# Patient Record
Sex: Male | Born: 1945
Health system: Southern US, Community
[De-identification: ages and names within clinical notes are randomized; demographics above are authoritative.]

## PROBLEM LIST (undated history)

## (undated) DIAGNOSIS — E119 Type 2 diabetes mellitus without complications: Secondary | ICD-10-CM

## (undated) DIAGNOSIS — I509 Heart failure, unspecified: Secondary | ICD-10-CM

## (undated) DIAGNOSIS — R0600 Dyspnea, unspecified: Secondary | ICD-10-CM

## (undated) DIAGNOSIS — J9611 Chronic respiratory failure with hypoxia: Secondary | ICD-10-CM

## (undated) DIAGNOSIS — J42 Unspecified chronic bronchitis: Secondary | ICD-10-CM

## (undated) DIAGNOSIS — J449 Chronic obstructive pulmonary disease, unspecified: Secondary | ICD-10-CM

## (undated) DIAGNOSIS — I10 Essential (primary) hypertension: Secondary | ICD-10-CM

## (undated) DIAGNOSIS — I5189 Other ill-defined heart diseases: Secondary | ICD-10-CM

## (undated) HISTORY — PX: UPPER GI ENDOSCOPY: SHX6162

## (undated) HISTORY — DX: Heart failure, unspecified: I50.9

## (undated) HISTORY — PX: CIRCUMCISION: SUR203

---

## 2006-07-26 ENCOUNTER — Ambulatory Visit: Payer: Self-pay | Admitting: Unknown Physician Specialty

## 2007-03-18 ENCOUNTER — Ambulatory Visit: Payer: Self-pay | Admitting: Internal Medicine

## 2008-04-27 ENCOUNTER — Ambulatory Visit: Payer: Self-pay | Admitting: Internal Medicine

## 2008-05-18 ENCOUNTER — Ambulatory Visit: Payer: Self-pay | Admitting: Internal Medicine

## 2008-05-30 ENCOUNTER — Emergency Department: Payer: Self-pay | Admitting: Emergency Medicine

## 2008-08-26 ENCOUNTER — Ambulatory Visit: Payer: Self-pay | Admitting: Urology

## 2009-07-27 ENCOUNTER — Ambulatory Visit: Payer: Self-pay | Admitting: Internal Medicine

## 2012-02-11 ENCOUNTER — Ambulatory Visit: Payer: Self-pay

## 2012-02-11 LAB — AMYLASE: Amylase: 70 U/L (ref 25–115)

## 2012-02-11 LAB — COMPREHENSIVE METABOLIC PANEL
Alkaline Phosphatase: 109 U/L (ref 50–136)
Bilirubin,Total: 0.7 mg/dL (ref 0.2–1.0)
Chloride: 95 mmol/L — ABNORMAL LOW (ref 98–107)
Co2: 29 mmol/L (ref 21–32)
EGFR (African American): >60
EGFR (Non-African Amer.): >60
Glucose: 179 mg/dL — ABNORMAL HIGH (ref 65–99)
Potassium: 4.1 mmol/L (ref 3.5–5.1)
SGOT(AST): 20 U/L (ref 15–37)
SGPT (ALT): 45 U/L
Total Protein: 7.9 g/dL (ref 6.4–8.2)

## 2012-02-11 LAB — CBC WITH DIFFERENTIAL/PLATELET
Basophil #: 0.1 10*3/uL (ref 0.0–0.1)
Basophil %: 0.3 %
Eosinophil #: 0.3 10*3/uL (ref 0.0–0.7)
Eosinophil %: 1.5 %
HCT: 51.6 % (ref 40.0–52.0)
Lymphocyte #: 4 10*3/uL — ABNORMAL HIGH (ref 1.0–3.6)
Monocyte #: 1.6 x10 3/mm — ABNORMAL HIGH (ref 0.2–1.0)
Neutrophil #: 13.4 10*3/uL — ABNORMAL HIGH (ref 1.4–6.5)
Neutrophil %: 69.1 %
RBC: 5.45 10*6/uL (ref 4.40–5.90)
RDW: 13.7 % (ref 11.5–14.5)

## 2012-02-11 LAB — URINALYSIS, COMPLETE
Bacteria: NEGATIVE
Bilirubin,UR: NEGATIVE
Nitrite: NEGATIVE
Specific Gravity: 1.01 (ref 1.003–1.030)
Squamous Epithelial: NONE SEEN

## 2012-02-13 LAB — URINE CULTURE

## 2014-01-27 ENCOUNTER — Ambulatory Visit: Payer: Self-pay | Admitting: Specialist

## 2015-03-23 DIAGNOSIS — Z87891 Personal history of nicotine dependence: Secondary | ICD-10-CM | POA: Diagnosis not present

## 2015-03-23 DIAGNOSIS — M67919 Unspecified disorder of synovium and tendon, unspecified shoulder: Secondary | ICD-10-CM | POA: Diagnosis not present

## 2015-03-23 DIAGNOSIS — E784 Other hyperlipidemia: Secondary | ICD-10-CM | POA: Diagnosis not present

## 2015-03-23 DIAGNOSIS — R531 Weakness: Secondary | ICD-10-CM | POA: Diagnosis not present

## 2015-03-23 DIAGNOSIS — I10 Essential (primary) hypertension: Secondary | ICD-10-CM | POA: Diagnosis not present

## 2015-04-13 DIAGNOSIS — I272 Other secondary pulmonary hypertension: Secondary | ICD-10-CM | POA: Diagnosis not present

## 2015-04-13 DIAGNOSIS — E1165 Type 2 diabetes mellitus with hyperglycemia: Secondary | ICD-10-CM | POA: Diagnosis not present

## 2015-04-13 DIAGNOSIS — R7301 Impaired fasting glucose: Secondary | ICD-10-CM | POA: Diagnosis not present

## 2015-04-13 DIAGNOSIS — I119 Hypertensive heart disease without heart failure: Secondary | ICD-10-CM | POA: Diagnosis not present

## 2015-04-20 DIAGNOSIS — M7550 Bursitis of unspecified shoulder: Secondary | ICD-10-CM | POA: Diagnosis not present

## 2015-04-20 DIAGNOSIS — Z87891 Personal history of nicotine dependence: Secondary | ICD-10-CM | POA: Diagnosis not present

## 2015-04-20 DIAGNOSIS — M67919 Unspecified disorder of synovium and tendon, unspecified shoulder: Secondary | ICD-10-CM | POA: Diagnosis not present

## 2015-04-20 DIAGNOSIS — I272 Other secondary pulmonary hypertension: Secondary | ICD-10-CM | POA: Diagnosis not present

## 2015-05-04 DIAGNOSIS — E119 Type 2 diabetes mellitus without complications: Secondary | ICD-10-CM | POA: Diagnosis not present

## 2015-05-04 DIAGNOSIS — M7550 Bursitis of unspecified shoulder: Secondary | ICD-10-CM | POA: Diagnosis not present

## 2015-05-04 DIAGNOSIS — I272 Other secondary pulmonary hypertension: Secondary | ICD-10-CM | POA: Diagnosis not present

## 2015-05-04 DIAGNOSIS — J439 Emphysema, unspecified: Secondary | ICD-10-CM | POA: Diagnosis not present

## 2015-05-05 DIAGNOSIS — Z23 Encounter for immunization: Secondary | ICD-10-CM | POA: Diagnosis not present

## 2015-06-01 DIAGNOSIS — J431 Panlobular emphysema: Secondary | ICD-10-CM | POA: Diagnosis not present

## 2015-06-01 DIAGNOSIS — E119 Type 2 diabetes mellitus without complications: Secondary | ICD-10-CM | POA: Diagnosis not present

## 2015-06-01 DIAGNOSIS — I272 Other secondary pulmonary hypertension: Secondary | ICD-10-CM | POA: Diagnosis not present

## 2015-06-03 DIAGNOSIS — E119 Type 2 diabetes mellitus without complications: Secondary | ICD-10-CM | POA: Diagnosis not present

## 2015-06-03 DIAGNOSIS — Z125 Encounter for screening for malignant neoplasm of prostate: Secondary | ICD-10-CM | POA: Diagnosis not present

## 2015-06-03 DIAGNOSIS — E784 Other hyperlipidemia: Secondary | ICD-10-CM | POA: Diagnosis not present

## 2015-06-03 DIAGNOSIS — I10 Essential (primary) hypertension: Secondary | ICD-10-CM | POA: Diagnosis not present

## 2015-06-08 DIAGNOSIS — M619 Calcification and ossification of muscle, unspecified: Secondary | ICD-10-CM | POA: Diagnosis not present

## 2015-06-08 DIAGNOSIS — M7550 Bursitis of unspecified shoulder: Secondary | ICD-10-CM | POA: Diagnosis not present

## 2015-06-08 DIAGNOSIS — J439 Emphysema, unspecified: Secondary | ICD-10-CM | POA: Diagnosis not present

## 2015-06-08 DIAGNOSIS — I272 Other secondary pulmonary hypertension: Secondary | ICD-10-CM | POA: Diagnosis not present

## 2015-06-29 DIAGNOSIS — Z87891 Personal history of nicotine dependence: Secondary | ICD-10-CM | POA: Diagnosis not present

## 2015-06-29 DIAGNOSIS — I272 Other secondary pulmonary hypertension: Secondary | ICD-10-CM | POA: Diagnosis not present

## 2015-06-29 DIAGNOSIS — M67919 Unspecified disorder of synovium and tendon, unspecified shoulder: Secondary | ICD-10-CM | POA: Diagnosis not present

## 2015-06-29 DIAGNOSIS — J439 Emphysema, unspecified: Secondary | ICD-10-CM | POA: Diagnosis not present

## 2015-07-13 DIAGNOSIS — J439 Emphysema, unspecified: Secondary | ICD-10-CM | POA: Diagnosis not present

## 2015-07-13 DIAGNOSIS — I272 Other secondary pulmonary hypertension: Secondary | ICD-10-CM | POA: Diagnosis not present

## 2015-07-13 DIAGNOSIS — M7550 Bursitis of unspecified shoulder: Secondary | ICD-10-CM | POA: Diagnosis not present

## 2015-07-13 DIAGNOSIS — M67919 Unspecified disorder of synovium and tendon, unspecified shoulder: Secondary | ICD-10-CM | POA: Diagnosis not present

## 2015-08-25 DIAGNOSIS — J209 Acute bronchitis, unspecified: Secondary | ICD-10-CM | POA: Diagnosis not present

## 2015-08-25 DIAGNOSIS — M7550 Bursitis of unspecified shoulder: Secondary | ICD-10-CM | POA: Diagnosis not present

## 2015-08-25 DIAGNOSIS — M67919 Unspecified disorder of synovium and tendon, unspecified shoulder: Secondary | ICD-10-CM | POA: Diagnosis not present

## 2015-08-25 DIAGNOSIS — J439 Emphysema, unspecified: Secondary | ICD-10-CM | POA: Diagnosis not present

## 2015-10-10 DIAGNOSIS — M7550 Bursitis of unspecified shoulder: Secondary | ICD-10-CM | POA: Diagnosis not present

## 2015-10-10 DIAGNOSIS — J209 Acute bronchitis, unspecified: Secondary | ICD-10-CM | POA: Diagnosis not present

## 2015-10-10 DIAGNOSIS — M67919 Unspecified disorder of synovium and tendon, unspecified shoulder: Secondary | ICD-10-CM | POA: Diagnosis not present

## 2015-10-10 DIAGNOSIS — I272 Other secondary pulmonary hypertension: Secondary | ICD-10-CM | POA: Diagnosis not present

## 2015-11-02 DIAGNOSIS — I739 Peripheral vascular disease, unspecified: Secondary | ICD-10-CM | POA: Diagnosis not present

## 2015-11-02 DIAGNOSIS — J209 Acute bronchitis, unspecified: Secondary | ICD-10-CM | POA: Diagnosis not present

## 2015-11-22 DIAGNOSIS — J439 Emphysema, unspecified: Secondary | ICD-10-CM | POA: Diagnosis not present

## 2015-11-22 DIAGNOSIS — I739 Peripheral vascular disease, unspecified: Secondary | ICD-10-CM | POA: Diagnosis not present

## 2015-11-22 DIAGNOSIS — G4700 Insomnia, unspecified: Secondary | ICD-10-CM | POA: Diagnosis not present

## 2015-11-22 DIAGNOSIS — M67919 Unspecified disorder of synovium and tendon, unspecified shoulder: Secondary | ICD-10-CM | POA: Diagnosis not present

## 2015-11-22 DIAGNOSIS — I272 Other secondary pulmonary hypertension: Secondary | ICD-10-CM | POA: Diagnosis not present

## 2016-01-03 DIAGNOSIS — I272 Other secondary pulmonary hypertension: Secondary | ICD-10-CM | POA: Diagnosis not present

## 2016-01-03 DIAGNOSIS — M67919 Unspecified disorder of synovium and tendon, unspecified shoulder: Secondary | ICD-10-CM | POA: Diagnosis not present

## 2016-01-03 DIAGNOSIS — J439 Emphysema, unspecified: Secondary | ICD-10-CM | POA: Diagnosis not present

## 2016-01-03 DIAGNOSIS — I739 Peripheral vascular disease, unspecified: Secondary | ICD-10-CM | POA: Diagnosis not present

## 2016-01-24 DIAGNOSIS — M79609 Pain in unspecified limb: Secondary | ICD-10-CM | POA: Diagnosis not present

## 2016-01-24 DIAGNOSIS — I10 Essential (primary) hypertension: Secondary | ICD-10-CM | POA: Diagnosis not present

## 2016-01-24 DIAGNOSIS — E785 Hyperlipidemia, unspecified: Secondary | ICD-10-CM | POA: Diagnosis not present

## 2016-01-24 DIAGNOSIS — E119 Type 2 diabetes mellitus without complications: Secondary | ICD-10-CM | POA: Diagnosis not present

## 2016-02-15 DIAGNOSIS — M79609 Pain in unspecified limb: Secondary | ICD-10-CM | POA: Diagnosis not present

## 2016-02-15 DIAGNOSIS — I10 Essential (primary) hypertension: Secondary | ICD-10-CM | POA: Diagnosis not present

## 2016-02-15 DIAGNOSIS — E119 Type 2 diabetes mellitus without complications: Secondary | ICD-10-CM | POA: Diagnosis not present

## 2016-02-15 DIAGNOSIS — E785 Hyperlipidemia, unspecified: Secondary | ICD-10-CM | POA: Diagnosis not present

## 2016-02-15 DIAGNOSIS — I739 Peripheral vascular disease, unspecified: Secondary | ICD-10-CM | POA: Diagnosis not present

## 2016-03-12 DIAGNOSIS — M7542 Impingement syndrome of left shoulder: Secondary | ICD-10-CM | POA: Diagnosis not present

## 2016-03-12 DIAGNOSIS — S46012A Strain of muscle(s) and tendon(s) of the rotator cuff of left shoulder, initial encounter: Secondary | ICD-10-CM | POA: Diagnosis not present

## 2016-04-02 DIAGNOSIS — M7542 Impingement syndrome of left shoulder: Secondary | ICD-10-CM | POA: Diagnosis not present

## 2016-04-24 DIAGNOSIS — I272 Other secondary pulmonary hypertension: Secondary | ICD-10-CM | POA: Diagnosis not present

## 2016-04-24 DIAGNOSIS — J439 Emphysema, unspecified: Secondary | ICD-10-CM | POA: Diagnosis not present

## 2016-04-24 DIAGNOSIS — J209 Acute bronchitis, unspecified: Secondary | ICD-10-CM | POA: Diagnosis not present

## 2016-04-24 DIAGNOSIS — I739 Peripheral vascular disease, unspecified: Secondary | ICD-10-CM | POA: Diagnosis not present

## 2016-05-07 DIAGNOSIS — S46012A Strain of muscle(s) and tendon(s) of the rotator cuff of left shoulder, initial encounter: Secondary | ICD-10-CM | POA: Diagnosis not present

## 2016-05-07 DIAGNOSIS — S46812A Strain of other muscles, fascia and tendons at shoulder and upper arm level, left arm, initial encounter: Secondary | ICD-10-CM | POA: Diagnosis not present

## 2016-05-07 DIAGNOSIS — M7542 Impingement syndrome of left shoulder: Secondary | ICD-10-CM | POA: Diagnosis not present

## 2016-05-07 DIAGNOSIS — M25412 Effusion, left shoulder: Secondary | ICD-10-CM | POA: Diagnosis not present

## 2016-05-07 DIAGNOSIS — M7582 Other shoulder lesions, left shoulder: Secondary | ICD-10-CM | POA: Diagnosis not present

## 2016-05-07 DIAGNOSIS — M7522 Bicipital tendinitis, left shoulder: Secondary | ICD-10-CM | POA: Diagnosis not present

## 2016-05-17 DIAGNOSIS — M7542 Impingement syndrome of left shoulder: Secondary | ICD-10-CM | POA: Diagnosis not present

## 2016-05-23 DIAGNOSIS — M75120 Complete rotator cuff tear or rupture of unspecified shoulder, not specified as traumatic: Secondary | ICD-10-CM | POA: Insufficient documentation

## 2016-05-23 DIAGNOSIS — M75122 Complete rotator cuff tear or rupture of left shoulder, not specified as traumatic: Secondary | ICD-10-CM | POA: Diagnosis not present

## 2016-06-07 ENCOUNTER — Other Ambulatory Visit: Payer: Self-pay | Admitting: Specialist

## 2016-06-12 ENCOUNTER — Encounter
Admission: RE | Admit: 2016-06-12 | Discharge: 2016-06-12 | Disposition: A | Discharge status: Home / Self Care | Payer: Medicare PPO | Source: Ambulatory Visit | Attending: Specialist | Admitting: Specialist

## 2016-06-12 DIAGNOSIS — R9431 Abnormal electrocardiogram [ECG] [EKG]: Secondary | ICD-10-CM | POA: Diagnosis not present

## 2016-06-12 DIAGNOSIS — E119 Type 2 diabetes mellitus without complications: Secondary | ICD-10-CM | POA: Diagnosis not present

## 2016-06-12 DIAGNOSIS — Z01812 Encounter for preprocedural laboratory examination: Secondary | ICD-10-CM | POA: Insufficient documentation

## 2016-06-12 DIAGNOSIS — I10 Essential (primary) hypertension: Secondary | ICD-10-CM | POA: Diagnosis not present

## 2016-06-12 DIAGNOSIS — Z01818 Encounter for other preprocedural examination: Secondary | ICD-10-CM | POA: Insufficient documentation

## 2016-06-12 HISTORY — DX: Dyspnea, unspecified: R06.00

## 2016-06-12 HISTORY — DX: Essential (primary) hypertension: I10

## 2016-06-12 HISTORY — DX: Type 2 diabetes mellitus without complications: E11.9

## 2016-06-12 HISTORY — DX: Unspecified chronic bronchitis: J42

## 2016-06-12 LAB — CBC
HEMATOCRIT: 42.4 % (ref 40.0–52.0)
Hemoglobin: 14.7 g/dL (ref 13.0–18.0)
MCH: 33.3 pg (ref 26.0–34.0)
MCHC: 34.6 g/dL (ref 32.0–36.0)
MCV: 96.3 fL (ref 80.0–100.0)
PLATELETS: 286 10*3/uL (ref 150–440)
RBC: 4.4 MIL/uL (ref 4.40–5.90)
RDW: 14.3 % (ref 11.5–14.5)
WBC: 12.2 10*3/uL — AB (ref 3.8–10.6)

## 2016-06-12 LAB — BASIC METABOLIC PANEL
ANION GAP: 10 (ref 5–15)
BUN: 19 mg/dL (ref 6–20)
CALCIUM: 9.6 mg/dL (ref 8.9–10.3)
CO2: 28 mmol/L (ref 22–32)
Chloride: 102 mmol/L (ref 101–111)
Creatinine, Ser: 0.71 mg/dL (ref 0.61–1.24)
Glucose, Bld: 113 mg/dL — ABNORMAL HIGH (ref 65–99)
POTASSIUM: 3.7 mmol/L (ref 3.5–5.1)
Sodium: 140 mmol/L (ref 135–145)

## 2016-06-12 NOTE — Patient Instructions (Signed)
Your procedure is scheduled on: 06/20/16 Wednesday Report to Day Surgery. 2nd floor medical mall entrance To find out your arrival time please call [phone] between 1PM - 3PM on 06/19/16 Tuesday.  Remember: Instructions that are not followed completely may result in serious medical risk, up to and including death, or upon the discretion of your surgeon and anesthesiologist your surgery may need to be rescheduled.    __X__ 1. Do not eat food or drink liquids after midnight. No gum chewing or hard candies.     __X__ 2. No Alcohol for 24 hours before or after surgery.   ____ 3. Bring all medications with you on the day of surgery if instructed.    __X__ 4. Notify your doctor if there is any change in your medical condition     (cold, fever, infections).     Do not wear jewelry, make-up, hairpins, clips or nail polish.  Do not wear lotions, powders, or perfumes.   Do not shave 48 hours prior to surgery. Men may shave face and neck.  Do not bring valuables to the hospital.    [facility] is not responsible for any belongings or valuables.               Contacts, dentures or bridgework may not be worn into surgery.  Leave your suitcase in the car. After surgery it may be brought to your room.  For patients admitted to the hospital, discharge time is determined by your                treatment team.   Patients discharged the day of surgery will not be allowed to drive home.   Please read over the following fact sheets that you were given:   Surgical Site Infection Prevention   ____ Take these medicines the morning of surgery with A SIP OF WATER:    1. none  2.   3.   4.  5.  6.  ____ Fleet Enema (as directed)   __x__ Use CHG Soap as directed  __x__ Use inhalers on the day of surgery USE SYMBICORT AND VENTOLIN AND BRING VENTOLIN TO HOSPITAL  __X__ Stop metformin 2 days prior to surgery    ____ Take 1/2 of usual insulin dose the night before surgery and none on the morning  of surgery.   __X__ Stop Coumadin/Plavix/aspirin on 5 DAYS PRIOR AS INSTRUCTED BY DR MILLER  ____ Stop Anti-inflammatories on    __X__ Stop supplements until after surgery. VITAMIN B SUPER COMPLEX, VITAMIN C, FISH OIL   ____ Bring C-Pap to the hospital.

## 2016-06-14 NOTE — Pre-Procedure Instructions (Signed)
Faxed request for clearance/ekg,as instructed by dr Kayleen Memos, to dr [facility] and dr Ammie Ferrier. Had to leave message both places

## 2016-06-15 DIAGNOSIS — J439 Emphysema, unspecified: Secondary | ICD-10-CM | POA: Diagnosis not present

## 2016-06-15 DIAGNOSIS — M7550 Bursitis of unspecified shoulder: Secondary | ICD-10-CM | POA: Diagnosis not present

## 2016-06-15 DIAGNOSIS — E119 Type 2 diabetes mellitus without complications: Secondary | ICD-10-CM | POA: Diagnosis not present

## 2016-06-15 DIAGNOSIS — R079 Chest pain, unspecified: Secondary | ICD-10-CM | POA: Diagnosis not present

## 2016-06-15 DIAGNOSIS — I739 Peripheral vascular disease, unspecified: Secondary | ICD-10-CM | POA: Diagnosis not present

## 2016-06-18 DIAGNOSIS — R079 Chest pain, unspecified: Secondary | ICD-10-CM | POA: Diagnosis not present

## 2016-06-18 DIAGNOSIS — J209 Acute bronchitis, unspecified: Secondary | ICD-10-CM | POA: Diagnosis not present

## 2016-06-18 DIAGNOSIS — I739 Peripheral vascular disease, unspecified: Secondary | ICD-10-CM | POA: Diagnosis not present

## 2016-06-18 DIAGNOSIS — M7550 Bursitis of unspecified shoulder: Secondary | ICD-10-CM | POA: Diagnosis not present

## 2016-06-18 DIAGNOSIS — J439 Emphysema, unspecified: Secondary | ICD-10-CM | POA: Diagnosis not present

## 2016-06-18 NOTE — Pre-Procedure Instructions (Signed)
STRESS TEST DONE AND CLEARED LOW RISK BY DR Lavera Guise

## 2016-06-20 ENCOUNTER — Ambulatory Visit
Admission: RE | Admit: 2016-06-20 | Discharge: 2016-06-20 | Disposition: A | Discharge status: Home / Self Care | Payer: Medicare PPO | Source: Ambulatory Visit | Attending: Specialist | Admitting: Specialist

## 2016-06-20 ENCOUNTER — Ambulatory Visit: Payer: Medicare PPO | Admitting: Anesthesiology

## 2016-06-20 ENCOUNTER — Encounter
Admission: RE | Disposition: A | Discharge status: Home / Self Care | Payer: Self-pay | Source: Ambulatory Visit | Attending: Specialist

## 2016-06-20 DIAGNOSIS — Z880 Allergy status to penicillin: Secondary | ICD-10-CM | POA: Insufficient documentation

## 2016-06-20 DIAGNOSIS — Z79899 Other long term (current) drug therapy: Secondary | ICD-10-CM | POA: Diagnosis not present

## 2016-06-20 DIAGNOSIS — Z8249 Family history of ischemic heart disease and other diseases of the circulatory system: Secondary | ICD-10-CM | POA: Insufficient documentation

## 2016-06-20 DIAGNOSIS — M719 Bursopathy, unspecified: Secondary | ICD-10-CM | POA: Insufficient documentation

## 2016-06-20 DIAGNOSIS — Z87891 Personal history of nicotine dependence: Secondary | ICD-10-CM | POA: Insufficient documentation

## 2016-06-20 DIAGNOSIS — I10 Essential (primary) hypertension: Secondary | ICD-10-CM | POA: Insufficient documentation

## 2016-06-20 DIAGNOSIS — M25512 Pain in left shoulder: Secondary | ICD-10-CM | POA: Diagnosis not present

## 2016-06-20 DIAGNOSIS — R0902 Hypoxemia: Secondary | ICD-10-CM | POA: Diagnosis not present

## 2016-06-20 DIAGNOSIS — J449 Chronic obstructive pulmonary disease, unspecified: Secondary | ICD-10-CM | POA: Insufficient documentation

## 2016-06-20 DIAGNOSIS — E119 Type 2 diabetes mellitus without complications: Secondary | ICD-10-CM | POA: Insufficient documentation

## 2016-06-20 DIAGNOSIS — M75122 Complete rotator cuff tear or rupture of left shoulder, not specified as traumatic: Secondary | ICD-10-CM | POA: Diagnosis not present

## 2016-06-20 DIAGNOSIS — Z886 Allergy status to analgesic agent status: Secondary | ICD-10-CM | POA: Diagnosis not present

## 2016-06-20 DIAGNOSIS — S43422A Sprain of left rotator cuff capsule, initial encounter: Secondary | ICD-10-CM | POA: Diagnosis not present

## 2016-06-20 DIAGNOSIS — Z5333 Arthroscopic surgical procedure converted to open procedure: Secondary | ICD-10-CM | POA: Diagnosis not present

## 2016-06-20 DIAGNOSIS — M19012 Primary osteoarthritis, left shoulder: Secondary | ICD-10-CM | POA: Diagnosis not present

## 2016-06-20 DIAGNOSIS — M7542 Impingement syndrome of left shoulder: Secondary | ICD-10-CM | POA: Diagnosis not present

## 2016-06-20 HISTORY — PX: SHOULDER ARTHROSCOPY WITH OPEN ROTATOR CUFF REPAIR: SHX6092

## 2016-06-20 LAB — GLUCOSE, CAPILLARY
Glucose-Capillary: 154 mg/dL — ABNORMAL HIGH (ref 65–99)
Glucose-Capillary: 172 mg/dL — ABNORMAL HIGH (ref 65–99)

## 2016-06-20 SURGERY — ARTHROSCOPY, SHOULDER WITH REPAIR, ROTATOR CUFF, OPEN
Anesthesia: General | Site: Shoulder | Laterality: Left | Wound class: Clean

## 2016-06-20 MED ORDER — CHLORHEXIDINE GLUCONATE CLOTH 2 % EX PADS: 6.0000 | MEDICATED_PAD | Freq: Once | CUTANEOUS | Status: DC

## 2016-06-20 MED ORDER — CEFAZOLIN SODIUM-DEXTROSE 2-4 GM/100ML-% IV SOLN
INTRAVENOUS | Status: AC
  Filled 2016-06-20: qty 100

## 2016-06-20 MED ORDER — FAMOTIDINE 20 MG PO TABS
20.0000 mg | ORAL_TABLET | Freq: Once | ORAL | Status: AC
  Administered 2016-06-20: 20 mg via ORAL

## 2016-06-20 MED ORDER — EPINEPHRINE PF 1 MG/ML IJ SOLN
INTRAMUSCULAR | Status: DC | PRN
  Administered 2016-06-20: 8 mL

## 2016-06-20 MED ORDER — ACETAMINOPHEN 10 MG/ML IV SOLN
INTRAVENOUS | Status: DC | PRN
  Administered 2016-06-20: 1000 mg via INTRAVENOUS

## 2016-06-20 MED ORDER — MORPHINE SULFATE (PF) 4 MG/ML IV SOLN
INTRAVENOUS | Status: DC | PRN
  Administered 2016-06-20: 4 mg via INTRAMUSCULAR

## 2016-06-20 MED ORDER — BUPIVACAINE-EPINEPHRINE (PF) 0.25% -1:200000 IJ SOLN
INTRAMUSCULAR | Status: DC | PRN
  Administered 2016-06-20: 10 mL via PERINEURAL
  Administered 2016-06-20: 20 mL via PERINEURAL

## 2016-06-20 MED ORDER — BUPIVACAINE-EPINEPHRINE (PF) 0.5% -1:200000 IJ SOLN
INTRAMUSCULAR | Status: DC | PRN
  Administered 2016-06-20: 30 mL via PERINEURAL

## 2016-06-20 MED ORDER — BUPIVACAINE-EPINEPHRINE (PF) 0.25% -1:200000 IJ SOLN
INTRAMUSCULAR | Status: AC
  Filled 2016-06-20: qty 60

## 2016-06-20 MED ORDER — GABAPENTIN 300 MG PO CAPS
300.0000 mg | ORAL_CAPSULE | ORAL | Status: AC
  Administered 2016-06-20: 300 mg via ORAL

## 2016-06-20 MED ORDER — ROCURONIUM BROMIDE 100 MG/10ML IV SOLN
INTRAVENOUS | Status: DC | PRN
  Administered 2016-06-20 (×2): 10 mg via INTRAVENOUS
  Administered 2016-06-20: 40 mg via INTRAVENOUS
  Administered 2016-06-20: 5 mg via INTRAVENOUS

## 2016-06-20 MED ORDER — IPRATROPIUM-ALBUTEROL 0.5-2.5 (3) MG/3ML IN SOLN
RESPIRATORY_TRACT | Status: AC
  Administered 2016-06-20: 3 mL via RESPIRATORY_TRACT
  Filled 2016-06-20: qty 3

## 2016-06-20 MED ORDER — SODIUM CHLORIDE 0.9 % IV SOLN
INTRAVENOUS | Status: DC | PRN
  Administered 2016-06-20: 20 ug/min via INTRAVENOUS

## 2016-06-20 MED ORDER — MORPHINE SULFATE (PF) 4 MG/ML IV SOLN
INTRAVENOUS | Status: AC
  Filled 2016-06-20: qty 1

## 2016-06-20 MED ORDER — EPINEPHRINE 30 MG/30ML IJ SOLN
INTRAMUSCULAR | Status: AC
  Filled 2016-06-20: qty 1

## 2016-06-20 MED ORDER — NEOSTIGMINE METHYLSULFATE 10 MG/10ML IV SOLN
INTRAVENOUS | Status: DC | PRN
  Administered 2016-06-20: 5 mg via INTRAVENOUS

## 2016-06-20 MED ORDER — HYDROCODONE-ACETAMINOPHEN 7.5-325 MG PO TABS: 1.0000 | ORAL_TABLET | Freq: Four times a day (QID) | ORAL | 0 refills | Status: DC | PRN

## 2016-06-20 MED ORDER — NEOMYCIN-POLYMYXIN B GU 40-200000 IR SOLN
Status: AC
  Filled 2016-06-20: qty 2

## 2016-06-20 MED ORDER — GABAPENTIN 400 MG PO CAPS: 400.0000 mg | ORAL_CAPSULE | Freq: Two times a day (BID) | ORAL | 3 refills | Status: DC

## 2016-06-20 MED ORDER — METHYLPREDNISOLONE SODIUM SUCC 40 MG IJ SOLR
INTRAMUSCULAR | Status: AC
  Filled 2016-06-20: qty 4

## 2016-06-20 MED ORDER — MIDAZOLAM HCL 2 MG/2ML IJ SOLN
INTRAMUSCULAR | Status: DC | PRN
  Administered 2016-06-20: 1 mg via INTRAVENOUS

## 2016-06-20 MED ORDER — GLYCOPYRROLATE 0.2 MG/ML IJ SOLN
INTRAMUSCULAR | Status: DC | PRN
  Administered 2016-06-20: .8 mg via INTRAVENOUS

## 2016-06-20 MED ORDER — PROPOFOL 10 MG/ML IV BOLUS
INTRAVENOUS | Status: DC | PRN
  Administered 2016-06-20: 150 mg via INTRAVENOUS
  Administered 2016-06-20: 30 mg via INTRAVENOUS

## 2016-06-20 MED ORDER — BUPIVACAINE-EPINEPHRINE (PF) 0.5% -1:200000 IJ SOLN
INTRAMUSCULAR | Status: AC
  Filled 2016-06-20: qty 30

## 2016-06-20 MED ORDER — CEFAZOLIN SODIUM-DEXTROSE 2-4 GM/100ML-% IV SOLN
2.0000 g | INTRAVENOUS | Status: AC
  Administered 2016-06-20: 2 g via INTRAVENOUS

## 2016-06-20 MED ORDER — SODIUM CHLORIDE 0.9 % IV SOLN
INTRAVENOUS | Status: DC
  Administered 2016-06-20 (×3): via INTRAVENOUS

## 2016-06-20 MED ORDER — PHENYLEPHRINE HCL 10 MG/ML IJ SOLN
INTRAMUSCULAR | Status: DC | PRN
  Administered 2016-06-20 (×6): 100 ug via INTRAVENOUS

## 2016-06-20 MED ORDER — MELOXICAM 7.5 MG PO TABS
15.0000 mg | ORAL_TABLET | Freq: Once | ORAL | Status: AC
  Administered 2016-06-20: 15 mg via ORAL

## 2016-06-20 MED ORDER — CLINDAMYCIN PHOSPHATE 600 MG/50ML IV SOLN
INTRAVENOUS | Status: AC
  Filled 2016-06-20: qty 50

## 2016-06-20 MED ORDER — FAMOTIDINE 20 MG PO TABS
ORAL_TABLET | ORAL | Status: AC
  Administered 2016-06-20: 20 mg via ORAL
  Filled 2016-06-20: qty 1

## 2016-06-20 MED ORDER — MELOXICAM 7.5 MG PO TABS
ORAL_TABLET | ORAL | Status: AC
  Administered 2016-06-20: 15 mg via ORAL
  Filled 2016-06-20: qty 2

## 2016-06-20 MED ORDER — NEOMYCIN-POLYMYXIN B GU 40-200000 IR SOLN
Status: DC | PRN
  Administered 2016-06-20: 2 mL

## 2016-06-20 MED ORDER — FENTANYL CITRATE (PF) 100 MCG/2ML IJ SOLN: 25.0000 ug | INTRAMUSCULAR | Status: DC | PRN

## 2016-06-20 MED ORDER — IPRATROPIUM-ALBUTEROL 0.5-2.5 (3) MG/3ML IN SOLN
3.0000 mL | Freq: Once | RESPIRATORY_TRACT | Status: AC
  Administered 2016-06-20: 3 mL via RESPIRATORY_TRACT

## 2016-06-20 MED ORDER — IPRATROPIUM-ALBUTEROL 0.5-2.5 (3) MG/3ML IN SOLN
RESPIRATORY_TRACT | Status: AC
  Filled 2016-06-20: qty 3

## 2016-06-20 MED ORDER — CLINDAMYCIN PHOSPHATE 600 MG/50ML IV SOLN
600.0000 mg | Freq: Once | INTRAVENOUS | Status: AC
  Administered 2016-06-20: 600 mg via INTRAVENOUS

## 2016-06-20 MED ORDER — METHYLPREDNISOLONE SODIUM SUCC 125 MG IJ SOLR
INTRAMUSCULAR | Status: DC | PRN
  Administered 2016-06-20: 120 mg via INTRAVENOUS

## 2016-06-20 MED ORDER — GABAPENTIN 300 MG PO CAPS
ORAL_CAPSULE | ORAL | Status: AC
  Administered 2016-06-20: 300 mg via ORAL
  Filled 2016-06-20: qty 1

## 2016-06-20 MED ORDER — ONDANSETRON HCL 4 MG/2ML IJ SOLN: 4.0000 mg | Freq: Once | INTRAMUSCULAR | Status: DC | PRN

## 2016-06-20 MED ORDER — ACETAMINOPHEN 10 MG/ML IV SOLN
INTRAVENOUS | Status: AC
  Filled 2016-06-20: qty 100

## 2016-06-20 MED ORDER — FENTANYL CITRATE (PF) 100 MCG/2ML IJ SOLN
INTRAMUSCULAR | Status: DC | PRN
  Administered 2016-06-20 (×3): 50 ug via INTRAVENOUS

## 2016-06-20 MED ORDER — LIDOCAINE HCL (CARDIAC) 20 MG/ML IV SOLN
INTRAVENOUS | Status: DC | PRN
  Administered 2016-06-20: 40 mg via INTRAVENOUS

## 2016-06-20 SURGICAL SUPPLY — 61 items
ADAPTER IRRIG TUBE 2 SPIKE SOL (ADAPTER) ×6 IMPLANT
BLADE AGGRESSIVE PLUS 4.0 (BLADE) ×3 IMPLANT
BUR AGGRESSIVE+ 5.5 (BURR) ×3 IMPLANT
BUR BR 5.5 12 FLUTE (BURR) ×3 IMPLANT
BUR RADIUS 4.0X18.5 (BURR) ×3 IMPLANT
BUR RADIUS 5.5 (BURR) ×3 IMPLANT
CANNULA 5.75X7 CRYSTAL CLEAR (CANNULA) ×3 IMPLANT
CANNULA 8.5X75 THRED (CANNULA) ×3 IMPLANT
CANNULA PARTIAL THREAD 2X7 (CANNULA) ×3 IMPLANT
CHLORAPREP W/TINT 26ML (MISCELLANEOUS) ×6 IMPLANT
CONNECTOR PERFECT PASSER (CONNECTOR) ×3 IMPLANT
COVER MAYO STAND STRL (DRAPES) ×3 IMPLANT
DRAPE IMP U-DRAPE 54X76 (DRAPES) ×3 IMPLANT
DRAPE SHEET LG 3/4 BI-LAMINATE (DRAPES) ×3 IMPLANT
DRAPE STERI 35X30 U-POUCH (DRAPES) ×3 IMPLANT
ELECTRODES SELF ADHERING, STERILE POST OPERATIVE ELECTRODES ×6 IMPLANT
GAUZE PETRO XEROFOAM 1X8 (MISCELLANEOUS) ×3 IMPLANT
GAUZE SPONGE 4X4 12PLY STRL (GAUZE/BANDAGES/DRESSINGS) ×3 IMPLANT
GLOVE BIO SURGEON STRL SZ7.5 (GLOVE) ×12 IMPLANT
GLOVE BIO SURGEON STRL SZ8 (GLOVE) ×3 IMPLANT
GLOVE SURG ORTHO 8.0 STRL STRW (GLOVE) ×3 IMPLANT
GOWN STRL REUS W/ TWL LRG LVL4 (GOWN DISPOSABLE) ×4 IMPLANT
GOWN STRL REUS W/TWL LRG LVL4 (GOWN DISPOSABLE) ×8
IV LACTATED RINGER IRRG 3000ML (IV SOLUTION) ×16
IV LR IRRIG 3000ML ARTHROMATIC (IV SOLUTION) ×8 IMPLANT
KIT RM TURNOVER STRD PROC AR (KITS) ×3 IMPLANT
KIT SHOULDER TRACTION (DRAPES) ×3 IMPLANT
MANIFOLD NEPTUNE II (INSTRUMENTS) ×3 IMPLANT
MAT BLUE FLOOR 46X72 FLO (MISCELLANEOUS) ×6 IMPLANT
MULTI S KNOTLES FIXATION 5.5 (Orthopedic Implant) ×3 IMPLANT
NDL SAFETY 18GX1.5 (NEEDLE) ×3 IMPLANT
NEEDLE FILTER BLUNT 18X 1/2SAF (NEEDLE) ×2
NEEDLE FILTER BLUNT 18X1 1/2 (NEEDLE) ×1 IMPLANT
NEEDLE SPNL 18GX3.5 QUINCKE PK (NEEDLE) ×3 IMPLANT
NS IRRIG 500ML POUR BTL (IV SOLUTION) ×3 IMPLANT
PACK ARTHROSCOPY SHOULDER (MISCELLANEOUS) ×3 IMPLANT
PASSER SUT CAPTURE FIRST (SUTURE) ×6 IMPLANT
SET TUBE SUCT SHAVER OUTFL 24K (TUBING) ×3 IMPLANT
SET TUBE TIP INTRA-ARTICULAR (MISCELLANEOUS) ×3 IMPLANT
SLING ULTRA II LG (MISCELLANEOUS) ×3 IMPLANT
SOL PREP PVP 2OZ (MISCELLANEOUS) ×3
SOLUTION PREP PVP 2OZ (MISCELLANEOUS) ×1 IMPLANT
STAPLER SKIN PROX 35W (STAPLE) ×3 IMPLANT
SUT ETHILON 3 0 FSLX (SUTURE) ×3 IMPLANT
SUT PDS PLUS 0 (SUTURE) ×2
SUT PDS PLUS AB 0 CT-2 (SUTURE) ×1 IMPLANT
SUT PERFECTPASSER WHITE CART (SUTURE) ×3 IMPLANT
SUT SMART STITCH CARTRIDGE (SUTURE) ×3 IMPLANT
SUT VIC AB 0 CT1 36 (SUTURE) ×3 IMPLANT
SUT VIC AB 2-0 CT2 27 (SUTURE) ×3 IMPLANT
SUT VICRYL 3-0 27IN (SUTURE) ×3 IMPLANT
SUTURE MAGNUM WIRE 2X48 BLK (SUTURE) ×9 IMPLANT
SYR 20CC LL (SYRINGE) ×3 IMPLANT
SYR 30ML LL (SYRINGE) ×3 IMPLANT
SYR 50ML LL SCALE MARK (SYRINGE) ×3 IMPLANT
SYRINGE 10CC LL (SYRINGE) ×3 IMPLANT
TUBING ARTHRO INFLOW-ONLY STRL (TUBING) ×3 IMPLANT
TUBING CONNECTING 10 (TUBING) ×2 IMPLANT
TUBING CONNECTING 10' (TUBING) ×1
WAND HAND CNTRL MULTIVAC 90 (MISCELLANEOUS) ×3 IMPLANT
WIRE MAGNUM (SUTURE) ×6 IMPLANT

## 2016-06-20 NOTE — H&P (Signed)
THE PATIENT WAS SEEN PRIOR TO SURGERY TODAY.  HISTORY, ALLERGIES, HOME MEDICATIONS AND OPERATIVE PROCEDURE WERE REVIEWED. RISKS AND BENEFITS OF SURGERY DISCUSSED WITH PATIENT AGAIN.  NO CHANGES FROM INITIAL HISTORY AND PHYSICAL NOTED.    

## 2016-06-20 NOTE — Discharge Instructions (Signed)

## 2016-06-20 NOTE — OR Nursing (Signed)
Report given to b patel rn

## 2016-06-20 NOTE — Transfer of Care (Signed)
Immediate Anesthesia Transfer of Care Note  Patient: Julian Sparks  Procedure(s) Performed: Procedure(s): SHOULDER ARTHROSCOPY WITH OPEN ROTATOR CUFF REPAIR (Left)  Patient Location: PACU  Anesthesia Type:General  Level of Consciousness: awake  Airway & Oxygen Therapy: Patient Spontanous Breathing and Patient connected to face mask oxygen  Post-op Assessment: Report given to RN and Post -op Vital signs reviewed and stable  Post vital signs: Reviewed and stable  Last Vitals:  Vitals:   06/20/16 0932  BP: (!) 148/77  Pulse: 75  Resp: 20  Temp: 36.4 C    Last Pain:  Vitals:   06/20/16 0932  TempSrc: Tympanic  PainSc: 10-Worst pain ever         Complications: No apparent anesthesia complications

## 2016-06-20 NOTE — Progress Notes (Signed)
Shoulder immobilizer on to left shoulder

## 2016-06-20 NOTE — Progress Notes (Signed)
Sit pt up in chair  Daughter at bedside   Sat 95 on 1 liter of oxygen    Dr patel in to see pt   If oxygen does not come up  Might need to stay

## 2016-06-20 NOTE — Anesthesia Preprocedure Evaluation (Addendum)
Anesthesia Evaluation  Patient identified by MRN, date of birth, ID band Patient awake    Reviewed: Allergy & Precautions, NPO status , Patient's Chart, lab work & pertinent test results  Airway Mallampati: II  TM Distance: <3 FB     Dental  (+) Upper Dentures, Lower Dentures   Pulmonary shortness of breath and with exertion, COPD,  COPD inhaler, former smoker,    Pulmonary exam normal        Cardiovascular hypertension, Pt. on medications Normal cardiovascular exam     Neuro/Psych negative neurological ROS     GI/Hepatic negative GI ROS, Neg liver ROS,   Endo/Other  diabetes, Well Controlled, Type 2, Oral Hypoglycemic Agents  Renal/GU negative Renal ROS  negative genitourinary   Musculoskeletal   Abdominal Normal abdominal exam  (+)   Peds negative pediatric ROS (+)  Hematology negative hematology ROS (+)   Anesthesia Other Findings   Reproductive/Obstetrics                            Anesthesia Physical Anesthesia Plan  ASA: III  Anesthesia Plan: General   Post-op Pain Management:    Induction: Intravenous  Airway Management Planned: Oral ETT  Additional Equipment:   Intra-op Plan:   Post-operative Plan: Extubation in OR  Informed Consent: I have reviewed the patients History and Physical, chart, labs and discussed the procedure including the risks, benefits and alternatives for the proposed anesthesia with the patient or authorized representative who has indicated his/her understanding and acceptance.   Dental advisory given  Plan Discussed with: CRNA and Surgeon  Anesthesia Plan Comments:         Anesthesia Quick Evaluation

## 2016-06-20 NOTE — Progress Notes (Signed)
Another duo neb given for sat 89   Dr Kayleen Memos in to see pt    Pt not sob   No pain  Answers questions appropriately

## 2016-06-20 NOTE — Consult Note (Signed)
[facility] [place] NAME: Julian Sparks    MR#:  [id number]  DATE OF BIRTH:  [birth year]  DATE OF ADMISSION:  06/20/2016  PRIMARY CARE PHYSICIAN: [name], MD   REQUESTING/REFERRING PHYSICIAN: Sheilah Pigeon MD  CHIEF COMPLAINT:  No chief complaint on file.   HISTORY OF PRESENT ILLNESS: Julian Sparks  is a 70 y.o. male with a known history of COPD, diabetes, hypertension who had complete rotator cuff tear who underwent :SHOULDER ARTHROSCOPY WITH MINI OPEN ROTATOR CUFF REPAIR, DISTAL CLAVICLE EXCISION, SUBACROMIAL DECOMPRESSION, DEBRIDEMENT OF LABRUM, patient prior to surgery oxygen sat was 94% on room air postop his oxygen sats were in the 80s on room air. Patient has received 2 nebulizer therapies. I was asked by anesthesiology to evaluate the patient. Ration is breathing is improved. He is currently on 1 L. He is very anxious to go home and states that he will not stay in the hospital. He denies any chest pains or palpitations  PAST MEDICAL HISTORY:   Past Medical History:  Diagnosis Date  . Chronic bronchitis ([place])   . Diabetes mellitus without complication ([place])   . Dyspnea    with exertion  . Hypertension     PAST SURGICAL HISTORY:  Past Surgical History:  Procedure Laterality Date  . CIRCUMCISION    . UPPER GI ENDOSCOPY      SOCIAL HISTORY:  Social History  Substance Use Topics  . Smoking status: Former Research scientist (life sciences)  . Smokeless tobacco: Current User  . Alcohol use No    FAMILY HISTORY:  Family History  Problem Relation Age of Onset  . Hypertension Mother     DRUG ALLERGIES:  Allergies  Allergen Reactions  . Aspirin Other (See Comments)    325 mg aspirin cause stomach upset  . Penicillins Rash    REVIEW OF SYSTEMS:   CONSTITUTIONAL: No fever, fatigue or weakness.  EYES: No blurred or double vision.  EARS, NOSE, AND THROAT: No tinnitus or ear pain.  RESPIRATORY: No cough, Chronic shortness of breath, no  wheezing or hemoptysis.  CARDIOVASCULAR: No chest pain, orthopnea, edema.  GASTROINTESTINAL: No nausea, vomiting, diarrhea or abdominal pain.  GENITOURINARY: No dysuria, hematuria.  ENDOCRINE: No polyuria, nocturia,  HEMATOLOGY: No anemia, easy bruising or bleeding SKIN: No rash or lesion. MUSCULOSKELETAL: No joint pain or arthritis.   NEUROLOGIC: No tingling, numbness, weakness.  PSYCHIATRY: No anxiety or depression.   MEDICATIONS AT HOME:  Prior to Admission medications   Medication Sig Start Date End Date Taking? Authorizing Provider  albuterol (VENTOLIN HFA) 108 (90 Base) MCG/ACT inhaler Inhale 2 puffs into the lungs as needed for wheezing or shortness of breath.   Yes Historical Provider, MD  aspirin 81 MG EC tablet Take 81 mg by mouth daily. Swallow whole.    Historical Provider, MD  B Complex-C (SUPER B COMPLEX PO) Take 1 tablet by mouth daily.    Historical Provider, MD  budesonide-formoterol (SYMBICORT) 160-4.5 MCG/ACT inhaler Inhale 2 puffs into the lungs 2 (two) times daily.    Historical Provider, MD  gabapentin (NEURONTIN) 400 MG capsule Take 1 capsule (400 mg total) by mouth 2 (two) times daily. 06/20/16   Earnestine Leys, MD  glipiZIDE (GLUCOTROL) 5 MG tablet Take 5 mg by mouth 2 (two) times daily before a meal.    Historical Provider, MD  HYDROcodone-acetaminophen (NORCO) 7.5-325 MG tablet Take 1 tablet by mouth every 6 (six) hours as needed for moderate pain. 06/20/16   Earnestine Leys,  MD  losartan-hydrochlorothiazide (HYZAAR) 100-25 MG tablet Take 1 tablet by mouth daily.    Historical Provider, MD  metFORMIN (GLUCOPHAGE) 500 MG tablet Take by mouth 2 (two) times daily with a meal.    Historical Provider, MD  Omega-3 Fatty Acids (FISH OIL PO) Take 2 capsules by mouth 2 (two) times daily.    Historical Provider, MD  vitamin C (ASCORBIC ACID) 500 MG tablet Take 500 mg by mouth daily.    Historical Provider, MD      PHYSICAL EXAMINATION:   VITAL SIGNS: Blood pressure 129/79,  pulse 84, temperature 97 F (36.1 C), resp. rate 20, weight 193 lb (87.5 kg), SpO2 93 %.  GENERAL:  70 y.o.-year-old patient lying in the bed with no acute distress.  EYES: Pupils equal, round, reactive to light and accommodation. No scleral icterus. Extraocular muscles intact.  HEENT: Head atraumatic, normocephalic. Oropharynx and nasopharynx clear.  NECK:  Supple, no jugular venous distention. No thyroid enlargement, no tenderness.  LUNGS: Normal breath sounds bilaterally, no wheezing, rales,rhonchi or crepitation. No use of accessory muscles of respiration.  CARDIOVASCULAR: S1, S2 normal. No murmurs, rubs, or gallops.  ABDOMEN: Soft, nontender, nondistended. Bowel sounds present. No organomegaly or mass.  EXTREMITIES: No pedal edema, cyanosis, or clubbing. Has a sling in the left shoulder NEUROLOGIC: Cranial nerves II through XII are intact. Muscle strength 5/5 in all extremities. Sensation intact. Gait not checked.  PSYCHIATRIC: The patient is alert and oriented x 3.  SKIN: No obvious rash, lesion, or ulcer.   LABORATORY PANEL:   CBC No results for input(s): WBC, HGB, HCT, PLT, MCV, MCH, MCHC, RDW, LYMPHSABS, MONOABS, EOSABS, BASOSABS, BANDABS in the last 168 hours.  Invalid input(s): NEUTRABS, BANDSABD ------------------------------------------------------------------------------------------------------------------  Chemistries  No results for input(s): NA, K, CL, CO2, GLUCOSE, BUN, CREATININE, CALCIUM, MG, AST, ALT, ALKPHOS, BILITOT in the last 168 hours.  Invalid input(s): GFRCGP ------------------------------------------------------------------------------------------------------------------ estimated creatinine clearance is 87.8 mL/min (by C-G formula based on SCr of 0.71 mg/dL). ------------------------------------------------------------------------------------------------------------------ No results for input(s): TSH, T4TOTAL, T3FREE, THYROIDAB in the last 72  hours.  Invalid input(s): FREET3   Coagulation profile No results for input(s): INR, PROTIME in the last 168 hours. ------------------------------------------------------------------------------------------------------------------- No results for input(s): DDIMER in the last 72 hours. -------------------------------------------------------------------------------------------------------------------  Cardiac Enzymes No results for input(s): CKMB, TROPONINI, MYOGLOBIN in the last 168 hours.  Invalid input(s): CK ------------------------------------------------------------------------------------------------------------------ Invalid input(s): POCBNP  ---------------------------------------------------------------------------------------------------------------  Urinalysis    Component Value Date/Time   COLORURINE YELLOW 02/11/2012 [place] 02/11/2012 1608   LABSPEC 1.010 02/11/2012 1608   PHURINE 6.5 02/11/2012 1608   GLUCOSEU NEGATIVE 02/11/2012 1608   HGBUR 2+ 02/11/2012 1608   BILIRUBINUR NEGATIVE 02/11/2012 1608   KETONESUR NEGATIVE 02/11/2012 1608   PROTEINUR NEGATIVE 02/11/2012 1608   NITRITE NEGATIVE 02/11/2012 1608   LEUKOCYTESUR NEGATIVE 02/11/2012 1608     RADIOLOGY: No results found.  EKG: Orders placed or performed during the hospital encounter of 06/12/16  . EKG 12 lead  . EKG 12 lead    IMPRESSION AND PLAN: Patient is a 70 year old with history of COPD with postop hypoxia  1. Postop hypoxia Suspect related to his underlying lung disease. Agree with nebulizer therapy. I will give him a dose of Solu-Medrol. Try to wean his oxygen if not we will admit patient for observation overnight and try to wean his oxygen  2. Diabetes type 2 If he stays in the hospital place him on sliding scale insulin continue his oral regimen  3. Hypertension we'll  continue his home regimen    All the records are reviewed and case discussed with ED  provider. Management plans discussed with the patient, family and they are in agreement.  CODE STATUS: Code Status History    This patient does not have a recorded code status. Please follow your organizational policy for patients in this situation.       TOTAL TIME TAKING CARE OF THIS PATIENT: 50 minutes.    Dustin Flock M.D on 06/20/2016 at 3:19 PM  Between 7am to 6pm - Pager - [phone]  After 6pm go to [email or web address] - password [email or web address] [facility]  Office  [phone]  CC: Primary care physician; [name], MD

## 2016-06-20 NOTE — OR Nursing (Signed)
Patient and daughters state patient current breathing status is his normal.

## 2016-06-20 NOTE — Progress Notes (Signed)
Duo neb given for low sat

## 2016-06-20 NOTE — Progress Notes (Signed)
Tens unit people called and left message

## 2016-06-20 NOTE — Progress Notes (Signed)
Turned oxygen off at 1520

## 2016-06-20 NOTE — Anesthesia Procedure Notes (Signed)
Procedure Name: Intubation Date/Time: 06/20/2016 10:50 AM Performed by: Allean Found Pre-anesthesia Checklist: Patient identified, Emergency Drugs available, Suction available, Patient being monitored and Timeout performed Patient Re-evaluated:Patient Re-evaluated prior to inductionOxygen Delivery Method: Circle system utilized Preoxygenation: Pre-oxygenation with 100% oxygen Intubation Type: IV induction Ventilation: Two handed mask ventilation required Laryngoscope Size: Mac and 4 Grade View: Grade I Tube type: Oral Tube size: 7.5 mm Number of attempts: 1 Airway Equipment and Method: Stylet Placement Confirmation: ETT inserted through vocal cords under direct vision,  positive ETCO2 and breath sounds checked- equal and bilateral Secured at: 21 cm Tube secured with: Tape Dental Injury: Teeth and Oropharynx as per pre-operative assessment

## 2016-06-20 NOTE — Op Note (Signed)
06/20/2016  1:23 PM  PATIENT:  Julian Sparks    PRE-OPERATIVE DIAGNOSIS:  M75.122 Complete rotatr-cuff tear/ruptr of left shoulder, not trauma  POST-OPERATIVE DIAGNOSIS:  Same  PROCEDURE:  SHOULDER ARTHROSCOPY WITH MINI OPEN ROTATOR CUFF REPAIR, DISTAL CLAVICLE EXCISION, SUBACROMIAL DECOMPRESSION, DEBRIDEMENT OF LABRUM  SURGEON:  Raylinn Kosar E, MD  ASST:  [place], PAC  ANESTHESIA:   General plus interscalene block  PREOPERATIVE INDICATIONS:  Julian Sparks is a  70 y.o. male with a diagnosis of M75.122 Complete rotatr-cuff tear/ruptr of left shoulder, not trauma who failed conservative measures and elected for surgical management.    The risks benefits and alternatives were discussed with the patient preoperatively including but not limited to the risks of infection, bleeding, nerve injury, cardiopulmonary complications, the need for revision surgery, among others, and the patient was willing to proceed.  OPERATIVE IMPLANTS: 1 SMITH AND NEPHEW MULTIFIX ANCHOR  OPERATIVE FINDINGS: The patient had severe subacromial bursitis and impingement. The anterior acromion was prominent. The ACjoint was arthritic. The glenohumeral joint was intact.  The biceps tendon was normal in appearance.  There was a very large tear of the supraspinatus and infraspinatus.  This was V-shaped in nature.  OPERATIVE PROCEDURE: The patient was brought to the operating room where satisfactory general endotracheal and interscalene anesthesia were accomplished.The patient was turned into the lateral decubitus position and the shoulder was prepped and draped in a sterile fashion. Arthroscopy was carried out from a posterior portal with accessory portals laterally and anteriorly. The  joint was examined first. The above findings were encountered. The motorized shaver was introduced anteriorly and the undersurface of the rotator cuff probed and lightly debrided. The biceps tendon was left intact..  The labrum was  debrided with the ArthroCare wand as well. The arthroscope was redirected into the subacromial space. There was severe bursitis which was resected with the motorized shaver and ArthroCare wand. The large bur was introduced from a posterior portal and the anterior acromion was debrided. The undersurface of the clavicle was debrided with the bur which was then reintroduced from an anterior portal and the remaining distal clavicle completely excised.  At this point, I felt that the tear was complex enough to warrant mini open procedure.  The lateral portal was extended anteriorly and posteriorly and dissection carried out through subcutaneous tissue.  This was elevated off the muscle fascia.  The lateral portal was then extended superiorly and inferiorly.  Blunt dissection revealed considerable bursal tissue still present which was excised with scissors.  Theis exposed the rotator cuff tendons well.  The tear pattern as stated was V-shaped in nature.  There was about a 1.5 cm gap between the anterior and posterior attachments laterally.  The edges of the cuff were trimmed up.  4 Magnum wire sutures were then use as convergence sutures from medial to lateral.  2 more sutures were then put in place More laterally.  A [employer] multi fix anchor was then used to pass the  lateral 2 sutures and  they were brought down to a previously made hole laterally.  The multi fix anchor was deployed with tension on the sutures.  Traction had been reduced to 5 pounds.  Another Magnum wire suture was then passed in a figure-of-eight fashion through the lateral cuff to further approximate it.  At this point, the tear was completely closed, and the tendon brought back to the normal position on the tuberosity.  Internal and external rotation showed good apposition of  the tendon.  The joint was flushed and tmuscle fascia and attachment to the acromium was closed with 0 Vicryl suture.  Subcutaneous tissue was closed with 2-0 Vicryl.   All skin wounds were closed with staples. 1/2% Marcaine with morphine was injected into the shoulder. Sponge and needle counts were correct.   The dry sterile dressing and was applied along with a TENS unit and padded sling. Patient was awakened and taken recovery in good condition.   Basil Dess.D.

## 2016-06-20 NOTE — OR Nursing (Signed)
Clarified patient allergies and antibiotic  orders with Dr Sabra Heck

## 2016-06-20 NOTE — Progress Notes (Signed)
Dr Kayleen Memos in  To see pt   Sat on room air for 30mn  90

## 2016-06-20 NOTE — Anesthesia Postprocedure Evaluation (Signed)
Anesthesia Post Note  Patient: [name]  Procedure(s) Performed: Procedure(s) (LRB): SHOULDER ARTHROSCOPY WITH OPEN ROTATOR CUFF REPAIR (Left)  Patient location during evaluation: PACU Anesthesia Type: General Level of consciousness: awake and alert and oriented Pain management: pain level controlled Vital Signs Assessment: post-procedure vital signs reviewed and stable Respiratory status: spontaneous breathing Cardiovascular status: blood pressure returned to baseline Anesthetic complications: no Comments: Patient did well during case.  Some initial decrease in sats in PACU, resolved with deep breathing and nebs.  Patient feels comfortable with no increase work of breathing and sats in PACU at the end of 92-93% on room air     Last Vitals:  Vitals:   06/20/16 1517 06/20/16 1532  BP: 129/79 121/72  Pulse: 84 84  Resp: 20 (!) 22  Temp:  36.1 C    Last Pain:  Vitals:   06/20/16 0932  TempSrc: Tympanic  PainSc: 10-Worst pain ever                 Piers Baade

## 2016-06-21 ENCOUNTER — Encounter: Payer: Self-pay | Admitting: Specialist

## 2016-07-06 DIAGNOSIS — S43422D Sprain of left rotator cuff capsule, subsequent encounter: Secondary | ICD-10-CM | POA: Diagnosis not present

## 2016-07-17 DIAGNOSIS — G4700 Insomnia, unspecified: Secondary | ICD-10-CM | POA: Diagnosis not present

## 2016-07-17 DIAGNOSIS — J441 Chronic obstructive pulmonary disease with (acute) exacerbation: Secondary | ICD-10-CM | POA: Diagnosis not present

## 2016-07-17 DIAGNOSIS — R05 Cough: Secondary | ICD-10-CM | POA: Diagnosis not present

## 2016-07-17 DIAGNOSIS — J439 Emphysema, unspecified: Secondary | ICD-10-CM | POA: Diagnosis not present

## 2016-07-23 DIAGNOSIS — M25512 Pain in left shoulder: Secondary | ICD-10-CM | POA: Diagnosis not present

## 2016-08-13 DIAGNOSIS — M25612 Stiffness of left shoulder, not elsewhere classified: Secondary | ICD-10-CM | POA: Diagnosis not present

## 2016-08-13 DIAGNOSIS — M25512 Pain in left shoulder: Secondary | ICD-10-CM | POA: Diagnosis not present

## 2016-08-22 DIAGNOSIS — M25512 Pain in left shoulder: Secondary | ICD-10-CM | POA: Diagnosis not present

## 2016-08-24 DIAGNOSIS — M25512 Pain in left shoulder: Secondary | ICD-10-CM | POA: Diagnosis not present

## 2016-08-24 DIAGNOSIS — M25612 Stiffness of left shoulder, not elsewhere classified: Secondary | ICD-10-CM | POA: Diagnosis not present

## 2016-08-29 DIAGNOSIS — M25512 Pain in left shoulder: Secondary | ICD-10-CM | POA: Diagnosis not present

## 2016-08-29 DIAGNOSIS — M25612 Stiffness of left shoulder, not elsewhere classified: Secondary | ICD-10-CM | POA: Diagnosis not present

## 2016-09-11 DIAGNOSIS — M25612 Stiffness of left shoulder, not elsewhere classified: Secondary | ICD-10-CM | POA: Diagnosis not present

## 2016-09-11 DIAGNOSIS — M25512 Pain in left shoulder: Secondary | ICD-10-CM | POA: Diagnosis not present

## 2016-09-22 DIAGNOSIS — M25512 Pain in left shoulder: Secondary | ICD-10-CM | POA: Diagnosis not present

## 2016-09-28 DIAGNOSIS — J441 Chronic obstructive pulmonary disease with (acute) exacerbation: Secondary | ICD-10-CM | POA: Diagnosis not present

## 2016-09-28 DIAGNOSIS — G4700 Insomnia, unspecified: Secondary | ICD-10-CM | POA: Diagnosis not present

## 2016-09-28 DIAGNOSIS — I739 Peripheral vascular disease, unspecified: Secondary | ICD-10-CM | POA: Diagnosis not present

## 2016-09-28 DIAGNOSIS — J439 Emphysema, unspecified: Secondary | ICD-10-CM | POA: Diagnosis not present

## 2016-10-04 DIAGNOSIS — I739 Peripheral vascular disease, unspecified: Secondary | ICD-10-CM | POA: Diagnosis not present

## 2016-10-04 DIAGNOSIS — J439 Emphysema, unspecified: Secondary | ICD-10-CM | POA: Diagnosis not present

## 2016-10-04 DIAGNOSIS — J1100 Influenza due to unidentified influenza virus with unspecified type of pneumonia: Secondary | ICD-10-CM | POA: Diagnosis not present

## 2016-10-04 DIAGNOSIS — J441 Chronic obstructive pulmonary disease with (acute) exacerbation: Secondary | ICD-10-CM | POA: Diagnosis not present

## 2016-10-15 DIAGNOSIS — M25512 Pain in left shoulder: Secondary | ICD-10-CM | POA: Diagnosis not present

## 2016-10-15 DIAGNOSIS — M25612 Stiffness of left shoulder, not elsewhere classified: Secondary | ICD-10-CM | POA: Diagnosis not present

## 2016-10-22 DIAGNOSIS — M25612 Stiffness of left shoulder, not elsewhere classified: Secondary | ICD-10-CM | POA: Diagnosis not present

## 2016-10-22 DIAGNOSIS — M25512 Pain in left shoulder: Secondary | ICD-10-CM | POA: Diagnosis not present

## 2016-10-23 DIAGNOSIS — M25512 Pain in left shoulder: Secondary | ICD-10-CM | POA: Diagnosis not present

## 2016-10-29 DIAGNOSIS — M25612 Stiffness of left shoulder, not elsewhere classified: Secondary | ICD-10-CM | POA: Diagnosis not present

## 2016-10-29 DIAGNOSIS — M25512 Pain in left shoulder: Secondary | ICD-10-CM | POA: Diagnosis not present

## 2016-11-12 DIAGNOSIS — M25512 Pain in left shoulder: Secondary | ICD-10-CM | POA: Diagnosis not present

## 2016-11-12 DIAGNOSIS — M25612 Stiffness of left shoulder, not elsewhere classified: Secondary | ICD-10-CM | POA: Diagnosis not present

## 2016-11-20 DIAGNOSIS — M25512 Pain in left shoulder: Secondary | ICD-10-CM | POA: Diagnosis not present

## 2016-11-20 DIAGNOSIS — R609 Edema, unspecified: Secondary | ICD-10-CM | POA: Diagnosis not present

## 2016-11-20 DIAGNOSIS — M25612 Stiffness of left shoulder, not elsewhere classified: Secondary | ICD-10-CM | POA: Diagnosis not present

## 2016-11-26 DIAGNOSIS — M25512 Pain in left shoulder: Secondary | ICD-10-CM | POA: Diagnosis not present

## 2016-11-27 DIAGNOSIS — J441 Chronic obstructive pulmonary disease with (acute) exacerbation: Secondary | ICD-10-CM | POA: Diagnosis not present

## 2016-11-27 DIAGNOSIS — J439 Emphysema, unspecified: Secondary | ICD-10-CM | POA: Diagnosis not present

## 2016-11-27 DIAGNOSIS — G4700 Insomnia, unspecified: Secondary | ICD-10-CM | POA: Diagnosis not present

## 2016-11-27 DIAGNOSIS — I739 Peripheral vascular disease, unspecified: Secondary | ICD-10-CM | POA: Diagnosis not present

## 2017-01-20 DIAGNOSIS — M25512 Pain in left shoulder: Secondary | ICD-10-CM | POA: Diagnosis not present

## 2017-02-15 ENCOUNTER — Ambulatory Visit (INDEPENDENT_AMBULATORY_CARE_PROVIDER_SITE_OTHER): Payer: Self-pay | Admitting: Vascular Surgery

## 2017-02-15 ENCOUNTER — Encounter (INDEPENDENT_AMBULATORY_CARE_PROVIDER_SITE_OTHER): Payer: Self-pay

## 2017-03-13 DIAGNOSIS — I739 Peripheral vascular disease, unspecified: Secondary | ICD-10-CM | POA: Diagnosis not present

## 2017-03-13 DIAGNOSIS — J441 Chronic obstructive pulmonary disease with (acute) exacerbation: Secondary | ICD-10-CM | POA: Diagnosis not present

## 2017-03-13 DIAGNOSIS — M7550 Bursitis of unspecified shoulder: Secondary | ICD-10-CM | POA: Diagnosis not present

## 2017-03-13 DIAGNOSIS — J439 Emphysema, unspecified: Secondary | ICD-10-CM | POA: Diagnosis not present

## 2017-03-21 DIAGNOSIS — I10 Essential (primary) hypertension: Secondary | ICD-10-CM | POA: Diagnosis not present

## 2017-03-21 DIAGNOSIS — E119 Type 2 diabetes mellitus without complications: Secondary | ICD-10-CM | POA: Diagnosis not present

## 2017-03-21 DIAGNOSIS — R5381 Other malaise: Secondary | ICD-10-CM | POA: Diagnosis not present

## 2017-03-21 DIAGNOSIS — E784 Other hyperlipidemia: Secondary | ICD-10-CM | POA: Diagnosis not present

## 2017-03-21 DIAGNOSIS — Z125 Encounter for screening for malignant neoplasm of prostate: Secondary | ICD-10-CM | POA: Diagnosis not present

## 2017-07-22 DIAGNOSIS — M160 Bilateral primary osteoarthritis of hip: Secondary | ICD-10-CM | POA: Diagnosis not present

## 2017-07-22 DIAGNOSIS — E669 Obesity, unspecified: Secondary | ICD-10-CM | POA: Diagnosis not present

## 2017-07-22 DIAGNOSIS — Z7982 Long term (current) use of aspirin: Secondary | ICD-10-CM | POA: Diagnosis not present

## 2017-07-22 DIAGNOSIS — Z6832 Body mass index (BMI) 32.0-32.9, adult: Secondary | ICD-10-CM | POA: Diagnosis not present

## 2017-07-22 DIAGNOSIS — Z7984 Long term (current) use of oral hypoglycemic drugs: Secondary | ICD-10-CM | POA: Diagnosis not present

## 2017-07-22 DIAGNOSIS — I10 Essential (primary) hypertension: Secondary | ICD-10-CM | POA: Diagnosis not present

## 2017-07-22 DIAGNOSIS — J449 Chronic obstructive pulmonary disease, unspecified: Secondary | ICD-10-CM | POA: Diagnosis not present

## 2017-07-22 DIAGNOSIS — G4700 Insomnia, unspecified: Secondary | ICD-10-CM | POA: Diagnosis not present

## 2017-07-22 DIAGNOSIS — E119 Type 2 diabetes mellitus without complications: Secondary | ICD-10-CM | POA: Diagnosis not present

## 2017-07-24 DIAGNOSIS — M7550 Bursitis of unspecified shoulder: Secondary | ICD-10-CM | POA: Diagnosis not present

## 2017-07-24 DIAGNOSIS — I739 Peripheral vascular disease, unspecified: Secondary | ICD-10-CM | POA: Diagnosis not present

## 2017-07-24 DIAGNOSIS — J441 Chronic obstructive pulmonary disease with (acute) exacerbation: Secondary | ICD-10-CM | POA: Diagnosis not present

## 2017-07-24 DIAGNOSIS — J439 Emphysema, unspecified: Secondary | ICD-10-CM | POA: Diagnosis not present

## 2018-01-22 DIAGNOSIS — I739 Peripheral vascular disease, unspecified: Secondary | ICD-10-CM | POA: Diagnosis not present

## 2018-01-22 DIAGNOSIS — J441 Chronic obstructive pulmonary disease with (acute) exacerbation: Secondary | ICD-10-CM | POA: Diagnosis not present

## 2018-01-22 DIAGNOSIS — J439 Emphysema, unspecified: Secondary | ICD-10-CM | POA: Diagnosis not present

## 2018-03-14 DIAGNOSIS — J449 Chronic obstructive pulmonary disease, unspecified: Secondary | ICD-10-CM | POA: Diagnosis not present

## 2018-03-14 DIAGNOSIS — Z87891 Personal history of nicotine dependence: Secondary | ICD-10-CM | POA: Diagnosis not present

## 2018-03-14 DIAGNOSIS — Z6832 Body mass index (BMI) 32.0-32.9, adult: Secondary | ICD-10-CM | POA: Diagnosis not present

## 2018-03-14 DIAGNOSIS — E119 Type 2 diabetes mellitus without complications: Secondary | ICD-10-CM | POA: Diagnosis not present

## 2018-03-14 DIAGNOSIS — F329 Major depressive disorder, single episode, unspecified: Secondary | ICD-10-CM | POA: Diagnosis not present

## 2018-03-14 DIAGNOSIS — M158 Other polyosteoarthritis: Secondary | ICD-10-CM | POA: Diagnosis not present

## 2018-03-14 DIAGNOSIS — G4700 Insomnia, unspecified: Secondary | ICD-10-CM | POA: Diagnosis not present

## 2018-03-14 DIAGNOSIS — I10 Essential (primary) hypertension: Secondary | ICD-10-CM | POA: Diagnosis not present

## 2018-03-14 DIAGNOSIS — E669 Obesity, unspecified: Secondary | ICD-10-CM | POA: Diagnosis not present

## 2018-04-24 DIAGNOSIS — M7550 Bursitis of unspecified shoulder: Secondary | ICD-10-CM | POA: Diagnosis not present

## 2018-04-24 DIAGNOSIS — J441 Chronic obstructive pulmonary disease with (acute) exacerbation: Secondary | ICD-10-CM | POA: Diagnosis not present

## 2018-04-24 DIAGNOSIS — I739 Peripheral vascular disease, unspecified: Secondary | ICD-10-CM | POA: Diagnosis not present

## 2018-04-24 DIAGNOSIS — J439 Emphysema, unspecified: Secondary | ICD-10-CM | POA: Diagnosis not present

## 2018-05-21 DIAGNOSIS — Z23 Encounter for immunization: Secondary | ICD-10-CM | POA: Diagnosis not present

## 2018-07-04 DIAGNOSIS — J439 Emphysema, unspecified: Secondary | ICD-10-CM | POA: Diagnosis not present

## 2018-07-04 DIAGNOSIS — J441 Chronic obstructive pulmonary disease with (acute) exacerbation: Secondary | ICD-10-CM | POA: Diagnosis not present

## 2018-07-04 DIAGNOSIS — I739 Peripheral vascular disease, unspecified: Secondary | ICD-10-CM | POA: Diagnosis not present

## 2018-07-04 DIAGNOSIS — M7550 Bursitis of unspecified shoulder: Secondary | ICD-10-CM | POA: Diagnosis not present

## 2018-07-30 DIAGNOSIS — M7550 Bursitis of unspecified shoulder: Secondary | ICD-10-CM | POA: Diagnosis not present

## 2018-07-30 DIAGNOSIS — I739 Peripheral vascular disease, unspecified: Secondary | ICD-10-CM | POA: Diagnosis not present

## 2018-07-30 DIAGNOSIS — Z0000 Encounter for general adult medical examination without abnormal findings: Secondary | ICD-10-CM | POA: Diagnosis not present

## 2018-07-30 DIAGNOSIS — J439 Emphysema, unspecified: Secondary | ICD-10-CM | POA: Diagnosis not present

## 2018-07-30 DIAGNOSIS — J441 Chronic obstructive pulmonary disease with (acute) exacerbation: Secondary | ICD-10-CM | POA: Diagnosis not present

## 2018-09-03 DIAGNOSIS — J439 Emphysema, unspecified: Secondary | ICD-10-CM | POA: Diagnosis not present

## 2018-09-03 DIAGNOSIS — J441 Chronic obstructive pulmonary disease with (acute) exacerbation: Secondary | ICD-10-CM | POA: Diagnosis not present

## 2018-09-03 DIAGNOSIS — I739 Peripheral vascular disease, unspecified: Secondary | ICD-10-CM | POA: Diagnosis not present

## 2018-09-03 DIAGNOSIS — M7550 Bursitis of unspecified shoulder: Secondary | ICD-10-CM | POA: Diagnosis not present

## 2018-10-24 DIAGNOSIS — I739 Peripheral vascular disease, unspecified: Secondary | ICD-10-CM | POA: Diagnosis not present

## 2018-10-24 DIAGNOSIS — G4700 Insomnia, unspecified: Secondary | ICD-10-CM | POA: Diagnosis not present

## 2018-10-24 DIAGNOSIS — J439 Emphysema, unspecified: Secondary | ICD-10-CM | POA: Diagnosis not present

## 2018-10-24 DIAGNOSIS — J441 Chronic obstructive pulmonary disease with (acute) exacerbation: Secondary | ICD-10-CM | POA: Diagnosis not present

## 2018-12-08 DIAGNOSIS — G4700 Insomnia, unspecified: Secondary | ICD-10-CM | POA: Diagnosis not present

## 2018-12-08 DIAGNOSIS — Z7984 Long term (current) use of oral hypoglycemic drugs: Secondary | ICD-10-CM | POA: Diagnosis not present

## 2018-12-08 DIAGNOSIS — E669 Obesity, unspecified: Secondary | ICD-10-CM | POA: Diagnosis not present

## 2018-12-08 DIAGNOSIS — E119 Type 2 diabetes mellitus without complications: Secondary | ICD-10-CM | POA: Diagnosis not present

## 2018-12-08 DIAGNOSIS — J309 Allergic rhinitis, unspecified: Secondary | ICD-10-CM | POA: Diagnosis not present

## 2018-12-08 DIAGNOSIS — J449 Chronic obstructive pulmonary disease, unspecified: Secondary | ICD-10-CM | POA: Diagnosis not present

## 2018-12-08 DIAGNOSIS — Z6835 Body mass index (BMI) 35.0-35.9, adult: Secondary | ICD-10-CM | POA: Diagnosis not present

## 2018-12-08 DIAGNOSIS — E785 Hyperlipidemia, unspecified: Secondary | ICD-10-CM | POA: Diagnosis not present

## 2018-12-08 DIAGNOSIS — I10 Essential (primary) hypertension: Secondary | ICD-10-CM | POA: Diagnosis not present

## 2019-02-04 ENCOUNTER — Inpatient Hospital Stay
Admission: EM | Admit: 2019-02-04 | Discharge: 2019-02-06 | DRG: 190 | Disposition: A | Discharge status: Home Health | Payer: Medicare PPO | Attending: Internal Medicine | Admitting: Internal Medicine

## 2019-02-04 ENCOUNTER — Emergency Department: Payer: Medicare PPO

## 2019-02-04 ENCOUNTER — Other Ambulatory Visit: Payer: Self-pay

## 2019-02-04 ENCOUNTER — Encounter: Payer: Self-pay | Admitting: Emergency Medicine

## 2019-02-04 DIAGNOSIS — I700 Atherosclerosis of aorta: Secondary | ICD-10-CM | POA: Diagnosis not present

## 2019-02-04 DIAGNOSIS — Z880 Allergy status to penicillin: Secondary | ICD-10-CM | POA: Diagnosis not present

## 2019-02-04 DIAGNOSIS — J9621 Acute and chronic respiratory failure with hypoxia: Secondary | ICD-10-CM | POA: Diagnosis present

## 2019-02-04 DIAGNOSIS — J441 Chronic obstructive pulmonary disease with (acute) exacerbation: Principal | ICD-10-CM | POA: Diagnosis present

## 2019-02-04 DIAGNOSIS — I5030 Unspecified diastolic (congestive) heart failure: Secondary | ICD-10-CM | POA: Diagnosis not present

## 2019-02-04 DIAGNOSIS — Z79899 Other long term (current) drug therapy: Secondary | ICD-10-CM | POA: Diagnosis not present

## 2019-02-04 DIAGNOSIS — J449 Chronic obstructive pulmonary disease, unspecified: Secondary | ICD-10-CM | POA: Diagnosis not present

## 2019-02-04 DIAGNOSIS — E119 Type 2 diabetes mellitus without complications: Secondary | ICD-10-CM | POA: Diagnosis not present

## 2019-02-04 DIAGNOSIS — I110 Hypertensive heart disease with heart failure: Secondary | ICD-10-CM | POA: Diagnosis present

## 2019-02-04 DIAGNOSIS — I5031 Acute diastolic (congestive) heart failure: Secondary | ICD-10-CM | POA: Diagnosis present

## 2019-02-04 DIAGNOSIS — I2699 Other pulmonary embolism without acute cor pulmonale: Secondary | ICD-10-CM | POA: Diagnosis not present

## 2019-02-04 DIAGNOSIS — J439 Emphysema, unspecified: Secondary | ICD-10-CM | POA: Diagnosis not present

## 2019-02-04 DIAGNOSIS — J9690 Respiratory failure, unspecified, unspecified whether with hypoxia or hypercapnia: Secondary | ICD-10-CM | POA: Diagnosis present

## 2019-02-04 DIAGNOSIS — R600 Localized edema: Secondary | ICD-10-CM | POA: Diagnosis not present

## 2019-02-04 DIAGNOSIS — J181 Lobar pneumonia, unspecified organism: Secondary | ICD-10-CM | POA: Diagnosis not present

## 2019-02-04 DIAGNOSIS — Z8249 Family history of ischemic heart disease and other diseases of the circulatory system: Secondary | ICD-10-CM

## 2019-02-04 DIAGNOSIS — K5730 Diverticulosis of large intestine without perforation or abscess without bleeding: Secondary | ICD-10-CM | POA: Diagnosis not present

## 2019-02-04 DIAGNOSIS — F17290 Nicotine dependence, other tobacco product, uncomplicated: Secondary | ICD-10-CM | POA: Diagnosis not present

## 2019-02-04 DIAGNOSIS — J440 Chronic obstructive pulmonary disease with acute lower respiratory infection: Secondary | ICD-10-CM | POA: Diagnosis present

## 2019-02-04 DIAGNOSIS — Z886 Allergy status to analgesic agent status: Secondary | ICD-10-CM | POA: Diagnosis not present

## 2019-02-04 DIAGNOSIS — J9601 Acute respiratory failure with hypoxia: Secondary | ICD-10-CM | POA: Diagnosis not present

## 2019-02-04 DIAGNOSIS — Z7982 Long term (current) use of aspirin: Secondary | ICD-10-CM

## 2019-02-04 DIAGNOSIS — R590 Localized enlarged lymph nodes: Secondary | ICD-10-CM | POA: Diagnosis not present

## 2019-02-04 DIAGNOSIS — Z1159 Encounter for screening for other viral diseases: Secondary | ICD-10-CM

## 2019-02-04 DIAGNOSIS — Z7951 Long term (current) use of inhaled steroids: Secondary | ICD-10-CM | POA: Diagnosis not present

## 2019-02-04 DIAGNOSIS — J42 Unspecified chronic bronchitis: Secondary | ICD-10-CM | POA: Diagnosis not present

## 2019-02-04 DIAGNOSIS — M5417 Radiculopathy, lumbosacral region: Secondary | ICD-10-CM | POA: Diagnosis not present

## 2019-02-04 DIAGNOSIS — R599 Enlarged lymph nodes, unspecified: Secondary | ICD-10-CM | POA: Diagnosis not present

## 2019-02-04 DIAGNOSIS — J9809 Other diseases of bronchus, not elsewhere classified: Secondary | ICD-10-CM | POA: Diagnosis not present

## 2019-02-04 DIAGNOSIS — J209 Acute bronchitis, unspecified: Secondary | ICD-10-CM | POA: Diagnosis not present

## 2019-02-04 DIAGNOSIS — M67919 Unspecified disorder of synovium and tendon, unspecified shoulder: Secondary | ICD-10-CM | POA: Diagnosis not present

## 2019-02-04 DIAGNOSIS — R0602 Shortness of breath: Secondary | ICD-10-CM | POA: Diagnosis not present

## 2019-02-04 DIAGNOSIS — Z7984 Long term (current) use of oral hypoglycemic drugs: Secondary | ICD-10-CM

## 2019-02-04 DIAGNOSIS — R918 Other nonspecific abnormal finding of lung field: Secondary | ICD-10-CM | POA: Diagnosis not present

## 2019-02-04 HISTORY — DX: Other ill-defined heart diseases: I51.89

## 2019-02-04 LAB — PROTIME-INR
INR: 1 (ref 0.8–1.2)
Prothrombin Time: 12.7 seconds (ref 11.4–15.2)

## 2019-02-04 LAB — BASIC METABOLIC PANEL
Anion gap: 9 (ref 5–15)
BUN: 21 mg/dL (ref 8–23)
CO2: 29 mmol/L (ref 22–32)
Calcium: 8.9 mg/dL (ref 8.9–10.3)
Chloride: 101 mmol/L (ref 98–111)
Creatinine, Ser: 0.74 mg/dL (ref 0.61–1.24)
GFR calc Af Amer: >60 mL/min (ref >60)
GFR calc non Af Amer: >60 mL/min (ref >60)
Glucose, Bld: 139 mg/dL — ABNORMAL HIGH (ref 70–99)
Potassium: 4 mmol/L (ref 3.5–5.1)
Sodium: 139 mmol/L (ref 135–145)

## 2019-02-04 LAB — CBC
HCT: 45.4 % (ref 39.0–52.0)
Hemoglobin: 14.9 g/dL (ref 13.0–17.0)
MCH: 31.2 pg (ref 26.0–34.0)
MCHC: 32.8 g/dL (ref 30.0–36.0)
MCV: 95.2 fL (ref 80.0–100.0)
Platelets: 218 10*3/uL (ref 150–400)
RBC: 4.77 MIL/uL (ref 4.22–5.81)
RDW: 14.5 % (ref 11.5–15.5)
WBC: 9.6 10*3/uL (ref 4.0–10.5)
nRBC: 0 % (ref 0.0–0.2)

## 2019-02-04 LAB — BRAIN NATRIURETIC PEPTIDE: B Natriuretic Peptide: 37 pg/mL (ref 0.0–100.0)

## 2019-02-04 LAB — SARS CORONAVIRUS 2 BY RT PCR (HOSPITAL ORDER, PERFORMED IN ~~LOC~~ HOSPITAL LAB): SARS Coronavirus 2: NEGATIVE

## 2019-02-04 LAB — GLUCOSE, CAPILLARY
Glucose-Capillary: 149 mg/dL — ABNORMAL HIGH (ref 70–99)
Glucose-Capillary: 186 mg/dL — ABNORMAL HIGH (ref 70–99)

## 2019-02-04 LAB — PROCALCITONIN: Procalcitonin: <0.1 ng/mL

## 2019-02-04 LAB — TROPONIN I: Troponin I: <0.03 ng/mL (ref <0.03)

## 2019-02-04 LAB — APTT: aPTT: 32 seconds (ref 24–36)

## 2019-02-04 MED ORDER — POLYETHYLENE GLYCOL 3350 17 G PO PACK: 17.0000 g | PACK | Freq: Every day | ORAL | Status: DC | PRN

## 2019-02-04 MED ORDER — POTASSIUM CHLORIDE 2 MEQ/ML IV SOLN: INTRAVENOUS | Status: DC

## 2019-02-04 MED ORDER — GABAPENTIN 300 MG PO CAPS
400.0000 mg | ORAL_CAPSULE | Freq: Two times a day (BID) | ORAL | Status: DC
  Administered 2019-02-04 – 2019-02-06 (×4): 400 mg via ORAL
  Filled 2019-02-04 (×4): qty 1

## 2019-02-04 MED ORDER — HEPARIN BOLUS VIA INFUSION
5000.0000 [IU] | Freq: Once | INTRAVENOUS | Status: AC
  Administered 2019-02-04: 18:00:00 5000 [IU] via INTRAVENOUS
  Filled 2019-02-04: qty 5000

## 2019-02-04 MED ORDER — GUAIFENESIN ER 600 MG PO TB12
600.0000 mg | ORAL_TABLET | Freq: Two times a day (BID) | ORAL | Status: DC
  Administered 2019-02-05 – 2019-02-06 (×3): 600 mg via ORAL
  Filled 2019-02-04 (×4): qty 1

## 2019-02-04 MED ORDER — ALBUTEROL SULFATE (2.5 MG/3ML) 0.083% IN NEBU
2.5000 mg | INHALATION_SOLUTION | Freq: Once | RESPIRATORY_TRACT | Status: AC
  Administered 2019-02-04: 2.5 mg via RESPIRATORY_TRACT
  Filled 2019-02-04: qty 3

## 2019-02-04 MED ORDER — ACETAMINOPHEN 325 MG PO TABS
650.0000 mg | ORAL_TABLET | Freq: Four times a day (QID) | ORAL | Status: DC | PRN
  Administered 2019-02-05: 17:00:00 650 mg via ORAL
  Filled 2019-02-04: qty 2

## 2019-02-04 MED ORDER — SODIUM CHLORIDE 0.9% FLUSH
3.0000 mL | Freq: Once | INTRAVENOUS | Status: AC
  Administered 2019-02-04: 18:00:00 3 mL via INTRAVENOUS

## 2019-02-04 MED ORDER — LOSARTAN POTASSIUM 50 MG PO TABS
100.0000 mg | ORAL_TABLET | Freq: Every day | ORAL | Status: DC
  Administered 2019-02-05 – 2019-02-06 (×2): 100 mg via ORAL
  Filled 2019-02-04 (×2): qty 2

## 2019-02-04 MED ORDER — IPRATROPIUM-ALBUTEROL 0.5-2.5 (3) MG/3ML IN SOLN
3.0000 mL | Freq: Four times a day (QID) | RESPIRATORY_TRACT | Status: DC
  Administered 2019-02-04 – 2019-02-06 (×7): 3 mL via RESPIRATORY_TRACT
  Filled 2019-02-04 (×7): qty 3

## 2019-02-04 MED ORDER — ACETAMINOPHEN 650 MG RE SUPP: 650.0000 mg | Freq: Four times a day (QID) | RECTAL | Status: DC | PRN

## 2019-02-04 MED ORDER — SODIUM CHLORIDE 0.9% FLUSH
10.0000 mL | Freq: Two times a day (BID) | INTRAVENOUS | Status: DC
  Administered 2019-02-04 – 2019-02-05 (×2): 10 mL

## 2019-02-04 MED ORDER — ONDANSETRON HCL 4 MG PO TABS: 4.0000 mg | ORAL_TABLET | Freq: Four times a day (QID) | ORAL | Status: DC | PRN

## 2019-02-04 MED ORDER — SODIUM CHLORIDE 0.9% FLUSH
3.0000 mL | Freq: Two times a day (BID) | INTRAVENOUS | Status: DC
  Administered 2019-02-05 (×2): 3 mL via INTRAVENOUS

## 2019-02-04 MED ORDER — SODIUM CHLORIDE 0.9% FLUSH: 3.0000 mL | INTRAVENOUS | Status: DC | PRN

## 2019-02-04 MED ORDER — MOMETASONE FURO-FORMOTEROL FUM 200-5 MCG/ACT IN AERO
2.0000 | INHALATION_SPRAY | Freq: Two times a day (BID) | RESPIRATORY_TRACT | Status: DC
  Administered 2019-02-04 – 2019-02-06 (×4): 2 via RESPIRATORY_TRACT
  Filled 2019-02-04: qty 8.8

## 2019-02-04 MED ORDER — IOHEXOL 300 MG/ML  SOLN
75.0000 mL | Freq: Once | INTRAMUSCULAR | Status: AC | PRN
  Administered 2019-02-04: 75 mL via INTRAVENOUS

## 2019-02-04 MED ORDER — HEPARIN (PORCINE) 25000 UT/250ML-% IV SOLN
1400.0000 [IU]/h | INTRAVENOUS | Status: DC
  Administered 2019-02-04: 18:00:00 1600 [IU]/h via INTRAVENOUS
  Administered 2019-02-05: 1400 [IU]/h via INTRAVENOUS
  Filled 2019-02-04 (×2): qty 250

## 2019-02-04 MED ORDER — METHYLPREDNISOLONE SODIUM SUCC 125 MG IJ SOLR
60.0000 mg | Freq: Four times a day (QID) | INTRAMUSCULAR | Status: DC
  Administered 2019-02-04 – 2019-02-05 (×4): 60 mg via INTRAVENOUS
  Filled 2019-02-04 (×4): qty 2

## 2019-02-04 MED ORDER — HYDROCHLOROTHIAZIDE 25 MG PO TABS
25.0000 mg | ORAL_TABLET | Freq: Every day | ORAL | Status: DC
  Administered 2019-02-05 – 2019-02-06 (×2): 25 mg via ORAL
  Filled 2019-02-04 (×2): qty 1

## 2019-02-04 MED ORDER — LOSARTAN POTASSIUM-HCTZ 100-25 MG PO TABS: 1.0000 | ORAL_TABLET | Freq: Every day | ORAL | Status: DC

## 2019-02-04 MED ORDER — SODIUM CHLORIDE 0.9% FLUSH: 10.0000 mL | INTRAVENOUS | Status: DC | PRN

## 2019-02-04 MED ORDER — GLIPIZIDE 5 MG PO TABS
5.0000 mg | ORAL_TABLET | Freq: Two times a day (BID) | ORAL | Status: DC
  Administered 2019-02-04 – 2019-02-06 (×4): 5 mg via ORAL
  Filled 2019-02-04 (×5): qty 1

## 2019-02-04 MED ORDER — SODIUM CHLORIDE 0.9 % IV SOLN: 250.0000 mL | INTRAVENOUS | Status: DC | PRN

## 2019-02-04 MED ORDER — ASPIRIN EC 81 MG PO TBEC
81.0000 mg | DELAYED_RELEASE_TABLET | Freq: Every day | ORAL | Status: DC
  Administered 2019-02-05 – 2019-02-06 (×2): 81 mg via ORAL
  Filled 2019-02-04 (×2): qty 1

## 2019-02-04 MED ORDER — KCL IN DEXTROSE-NACL 10-5-0.45 MEQ/L-%-% IV SOLN
INTRAVENOUS | Status: DC
  Administered 2019-02-04: 19:00:00 via INTRAVENOUS
  Filled 2019-02-04: qty 1000

## 2019-02-04 MED ORDER — INSULIN ASPART 100 UNIT/ML ~~LOC~~ SOLN: 0.0000 [IU] | Freq: Every day | SUBCUTANEOUS | Status: DC

## 2019-02-04 MED ORDER — ONDANSETRON HCL 4 MG/2ML IJ SOLN: 4.0000 mg | Freq: Four times a day (QID) | INTRAMUSCULAR | Status: DC | PRN

## 2019-02-04 MED ORDER — HYDROCODONE-ACETAMINOPHEN 7.5-325 MG PO TABS
1.0000 | ORAL_TABLET | Freq: Four times a day (QID) | ORAL | Status: DC | PRN
  Administered 2019-02-05: 21:00:00 1 via ORAL
  Filled 2019-02-04: qty 1

## 2019-02-04 MED ORDER — INSULIN ASPART 100 UNIT/ML ~~LOC~~ SOLN
0.0000 [IU] | Freq: Three times a day (TID) | SUBCUTANEOUS | Status: DC
  Administered 2019-02-04: 3 [IU] via SUBCUTANEOUS
  Administered 2019-02-05 – 2019-02-06 (×4): 4 [IU] via SUBCUTANEOUS
  Filled 2019-02-04 (×5): qty 1

## 2019-02-04 NOTE — ED Notes (Signed)
ED TO INPATIENT HANDOFF REPORT  ED Nurse Name and Phone #: [phone]  S Name/Age/Gender Julian Sparks 73 y.o. male Room/Bed: ED11A/ED11A  Code Status   Code Status: Not on file  Home/SNF/Other Home Patient oriented to: situation Is this baseline? Yes   Triage Complete: Triage complete  Chief Complaint sob  Triage Note SOB x 2 months.  Treated by PCP with steroids, which initially improved symptoms, but symptoms returned.  Seen by PCP today and sats dropped to 81% with excerption.  Patient is AAOx3.  Skin warm and dry. DOE noted.   Allergies Allergies  Allergen Reactions  . Aspirin Nausea Only and Other (See Comments)    325 mg aspirin cause stomach upset  . Penicillins Rash    Level of Care/Admitting Diagnosis ED Disposition    ED Disposition Condition [facility] Area: [place] [[id number]]  Level of Care: Med-Surg [16]  Covid Evaluation: Confirmed COVID Negative  Diagnosis: Respiratory failure [facility]) [[id number]]  Admitting Physician: Gorden Harms [[id number]]  Attending Physician: Gorden Harms [[id number]]  Estimated length of stay: past midnight tomorrow  Certification:: I certify this patient will need inpatient services for at least 2 midnights  PT Class (Do Not Modify): Inpatient [101]  PT Acc Code (Do Not Modify): Private [1]       B Medical/Surgery History Past Medical History:  Diagnosis Date  . Chronic bronchitis ([place])   . Diabetes mellitus without complication ([place])   . Dyspnea    with exertion  . Hypertension    Past Surgical History:  Procedure Laterality Date  . CIRCUMCISION    . SHOULDER ARTHROSCOPY WITH OPEN ROTATOR CUFF REPAIR Left 06/20/2016   Procedure: SHOULDER ARTHROSCOPY WITH OPEN ROTATOR CUFF REPAIR;  Surgeon: Earnestine Leys, MD;  Location: [facility] ORS;  Service: Orthopedics;  Laterality: Left;  . UPPER GI ENDOSCOPY       A IV Location/Drains/Wounds Patient Lines/Drains/Airways Status    Active Line/Drains/Airways    Name:   Placement date:   Placement time:   Site:   Days:   Peripheral IV 02/04/19 Left Antecubital   02/04/19    1119    Antecubital   less than 1   Incision (Closed) 06/20/16 Shoulder Left   06/20/16    1145     959          Intake/Output Last 24 hours No intake or output data in the 24 hours ending 02/04/19 1558  Labs/Imaging Results for orders placed or performed during the hospital encounter of 02/04/19 (from the past 48 hour(s))  Basic metabolic panel     Status: Abnormal   Collection Time: 02/04/19 11:23 AM  Result Value Ref Range   Sodium 139 135 - 145 mmol/L   Potassium 4.0 3.5 - 5.1 mmol/L   Chloride 101 98 - 111 mmol/L   CO2 29 22 - 32 mmol/L   Glucose, Bld 139 (H) 70 - 99 mg/dL   BUN 21 8 - 23 mg/dL   Creatinine, Ser 0.74 0.61 - 1.24 mg/dL   Calcium 8.9 8.9 - 10.3 mg/dL   GFR calc non Af Amer >60 >60 mL/min   GFR calc Af Amer >60 >60 mL/min   Anion gap 9 5 - 15    Comment: Performed at [facility], [address] [place], [place] [address]  CBC     Status: None   Collection Time: 02/04/19 11:23 AM  Result Value Ref Range   WBC 9.6  4.0 - 10.5 K/uL   RBC 4.77 4.22 - 5.81 MIL/uL   Hemoglobin 14.9 13.0 - 17.0 g/dL   HCT 45.4 39.0 - 52.0 %   MCV 95.2 80.0 - 100.0 fL   MCH 31.2 26.0 - 34.0 pg   MCHC 32.8 30.0 - 36.0 g/dL   RDW 14.5 11.5 - 15.5 %   Platelets 218 150 - 400 K/uL   nRBC 0.0 0.0 - 0.2 %    Comment: Performed at [facility], [address] [place], [place] [address]  Troponin I - ONCE - STAT     Status: None   Collection Time: 02/04/19 11:23 AM  Result Value Ref Range   Troponin I <0.03 <0.03 ng/mL    Comment: Performed at [facility], [address] [place], [place] [address]  Brain natriuretic peptide     Status: None   Collection Time: 02/04/19 11:23 AM  Result Value Ref Range   B Natriuretic Peptide 37.0 0.0 - 100.0 pg/mL    Comment: Performed at [facility], [address] [place], [place] [address]   Dg Chest 2 View  Result Date: 02/04/2019 CLINICAL DATA:  Shortness of breath EXAM: CHEST - 2 VIEW COMPARISON:  July 27, 2009 FINDINGS: There is an irregular somewhat nodular appearing opacity in the left mid lung measuring 1.7 x 1.5 cm. There is mild scarring in this area. Elsewhere lungs appear clear. Heart size and pulmonary vascularity are normal. No adenopathy. There is aortic atherosclerosis. There is mild degenerative change in the lower thoracic region. IMPRESSION: Irregular somewhat nodular opacity measuring 1.7 x 1.5 cm in the left mid lung with nearby scarring. This area warrants correlation with chest CT, ideally with intravenous contrast, to further evaluate given concern for possible neoplasm in this area. Lungs elsewhere clear. No adenopathy evident. Heart size within normal limits. Aortic Atherosclerosis (ICD10-I70.0). These results will be called to the ordering clinician or representative by the Radiologist Assistant, and communication documented in the PACS or zVision Dashboard. Electronically Signed   By: Lowella Grip III M.D.   On: 02/04/2019 11:05   Ct Chest W Contrast  Result Date: 02/04/2019 CLINICAL DATA:  Dyspnea for 2 months. COPD. Abnormal chest radiograph. EXAM: CT CHEST WITH CONTRAST TECHNIQUE: Multidetector CT imaging of the chest was performed during intravenous contrast administration. CONTRAST:  65mL OMNIPAQUE IOHEXOL 300 MG/ML  SOLN COMPARISON:  02/04/2019 chest radiograph. FINDINGS: Cardiovascular: Normal heart size. No significant pericardial effusion/thickening. Three-vessel coronary atherosclerosis. Atherosclerotic nonaneurysmal thoracic aorta. Top-normal caliber main pulmonary artery (3.1 cm diameter). There is a questionable central segmental filling defect in a left upper lobe pulmonary artery branch (series 6/image 53). Mediastinum/Nodes: No discrete thyroid nodules. Unremarkable esophagus. No pathologically enlarged  axillary, mediastinal or hilar lymph nodes. Lungs/Pleura: No pneumothorax. No pleural effusion. Mild centrilobular and paraseptal emphysema with diffuse bronchial wall thickening. There are bandlike subpleural foci of consolidation in medial right upper lobe (series 7/image 33) and posterior left upper lobe (series 7/image 69), compatible with evolving postinfectious/postinflammatory scarring and/or atelectasis. No lung masses or significant pulmonary nodules. No central airway stenoses. Upper abdomen: There are multiple moderately enlarged upper retroperitoneal nodes in the gastrohepatic ligament, left para-aortic and aortocaval chains, partially visualized. For example a 2.4 cm gastrohepasic ligament node (series 6/image 145) and a 2.0 cm left para-aortic node (series 6/image 160). These nodes are newly enlarged since 02/11/2012 CT abdomen. Musculoskeletal: No aggressive appearing focal osseous lesions. Moderate thoracic spondylosis. IMPRESSION: 1. Questionable central segmental filling defect within  a left upper lobe pulmonary artery branch, not optimally evaluated on this routine contrast-enhanced chest CT study, cannot exclude acute segmental pulmonary embolism. Dedicated PE protocol chest CT angiogram with IV contrast may be obtained. 2. Moderate upper retroperitoneal adenopathy, partially visualized, new since 2013 CT abdomen study, cannot exclude metastatic disease or lymphoma. Dedicated CT abdomen/pelvis with oral and IV contrast recommended for further evaluation. 3. Bandlike subpleural foci of consolidation in the medial right upper lobe and posterior left upper lobe, compatible with evolving postinfectious/postinflammatory scarring and/or atelectasis. 4. Mild centrilobular and paraseptal emphysema with diffuse bronchial wall thickening, compatible with the reported history of COPD. 5. Three-vessel coronary atherosclerosis. Aortic Atherosclerosis (ICD10-I70.0) and Emphysema (ICD10-J43.9). Critical  Value/emergent results were called by telephone at the time of interpretation on 02/04/2019 at 12:59 pm to Dr. Arta Silence , who verbally acknowledged these results. Electronically Signed   By: Ilona Sorrel M.D.   On: 02/04/2019 13:03    Pending Labs Unresulted Labs (From admission, onward)    Start     Ordered   02/05/19 0000  Heparin level (unfractionated)  Once-Timed,   STAT     02/04/19 1513   02/04/19 1513  APTT  ONCE - STAT,   STAT     02/04/19 1513   02/04/19 1513  Protime-INR  ONCE - STAT,   STAT     02/04/19 1513   02/04/19 1410  Procalcitonin - Baseline  ONCE - STAT,   STAT     02/04/19 1409   Signed and Held  Hemoglobin A1c  Once,   R    Comments:  To assess prior glycemic control    Signed and Held   Signed and Held  Basic metabolic panel  Tomorrow morning,   R     Signed and Held          Vitals/Pain Today's Vitals   02/04/19 1100 02/04/19 1130 02/04/19 1200 02/04/19 1500  BP: (!) 164/90 (!) 141/74 132/89   Pulse: 78 67 65   Resp:      Temp:      TempSrc:      SpO2: 93% 92% 93%   Weight:      Height:    5\' 6"  (1.676 m)  PainSc:        Isolation Precautions No active isolations  Medications Medications  sodium chloride flush (NS) 0.9 % injection 3 mL (has no administration in time range)  heparin bolus via infusion 5,000 Units (has no administration in time range)  heparin ADULT infusion 100 units/mL (25000 units/211mL sodium chloride 0.45%) (has no administration in time range)  albuterol (PROVENTIL) (2.5 MG/3ML) 0.083% nebulizer solution 2.5 mg (2.5 mg Nebulization Given 02/04/19 1249)  iohexol (OMNIPAQUE) 300 MG/ML solution 75 mL (75 mLs Intravenous Contrast Given 02/04/19 1230)    Mobility walks Low fall risk   Focused Assessments Cardiac Assessment Handoff:    Lab Results  Component Value Date   TROPONINI <0.03 02/04/2019   No results found for: DDIMER Does the Patient currently have chest pain? No     R Recommendations: See  Admitting Provider Note  Report given to:   Additional Notes: patient had ct scan--cannot rule out PE  We have not started heparin yet, but he ned bolus and drip.  He will be coming up in a wheelchair.

## 2019-02-04 NOTE — H&P (Signed)
[facility] [place] NAME: Julian Sparks    MR#:  [id number]  DATE OF BIRTH:  [birth year]  DATE OF ADMISSION:  02/04/2019  PRIMARY CARE PHYSICIAN: [name], MD   REQUESTING/REFERRING PHYSICIAN:   CHIEF COMPLAINT:   Chief Complaint  Patient presents with  . Shortness of Breath    HISTORY OF PRESENT ILLNESS: Julian Sparks  is a 73 y.o. male with a known history per below presented to the emergency room with shortness of breath for 2 months duration, patient was treated with course of steroids by primary care provider which patient did improve but began to worsen once again after steroids were discontinued, patient seen by his primary care provider today for dyspnea on exertion, primary care provider sent patient to the emergency room given O2 saturation 81% on room air, in the emergency room patient was found to have questionable left upper pulmonary embolism on CT-repeat dedicated CTA of the chest was warranted to evaluate for pulmonary embolism/noted moderate upper retroperitoneal adenopathy concerning for metastasis/lymphoma/COPD changes, chest x-ray noted for left 1.7 x 1.5 cm nodule, BMP was normal, hospitalist asked to admit, patient valuated emergency room, no apparent distress, resting comfortably in bed, patient is now been admitted for acute abnormal CT chest concerning for pulmonary embolism, acute on COPD exacerbation, concern for undiagnosed cancer.  PAST MEDICAL HISTORY:   Past Medical History:  Diagnosis Date  . Chronic bronchitis ([place])   . Diabetes mellitus without complication ([place])   . Dyspnea    with exertion  . Hypertension     PAST SURGICAL HISTORY:  Past Surgical History:  Procedure Laterality Date  . CIRCUMCISION    . SHOULDER ARTHROSCOPY WITH OPEN ROTATOR CUFF REPAIR Left 06/20/2016   Procedure: SHOULDER ARTHROSCOPY WITH OPEN ROTATOR CUFF REPAIR;  Surgeon: Earnestine Leys, MD;  Location: [facility] ORS;  Service: Orthopedics;   Laterality: Left;  . UPPER GI ENDOSCOPY      SOCIAL HISTORY:  Social History   Tobacco Use  . Smoking status: Former Research scientist (life sciences)  . Smokeless tobacco: Current User  Substance Use Topics  . Alcohol use: No    FAMILY HISTORY:  Family History  Problem Relation Age of Onset  . Hypertension Mother     DRUG ALLERGIES:  Allergies  Allergen Reactions  . Aspirin Nausea Only and Other (See Comments)    325 mg aspirin cause stomach upset  . Penicillins Rash    REVIEW OF SYSTEMS:   CONSTITUTIONAL: No fever, +fatigue EYES: No blurred or double vision.  EARS, NOSE, AND THROAT: No tinnitus or ear pain.  RESPIRATORY: + cough, shortness of breath, wheezing  CARDIOVASCULAR: No chest pain, orthopnea, edema.  GASTROINTESTINAL: No nausea, vomiting, diarrhea or abdominal pain.  GENITOURINARY: No dysuria, hematuria.  ENDOCRINE: No polyuria, nocturia,  HEMATOLOGY: No anemia, easy bruising or bleeding SKIN: No rash or lesion. MUSCULOSKELETAL: No joint pain or arthritis.   NEUROLOGIC: No tingling, numbness, weakness.  PSYCHIATRY: No anxiety or depression.   MEDICATIONS AT HOME:  Prior to Admission medications   Medication Sig Start Date End Date Taking? Authorizing Provider  albuterol (VENTOLIN HFA) 108 (90 Base) MCG/ACT inhaler Inhale 2 puffs into the lungs as needed for wheezing or shortness of breath.    [provider]  aspirin 81 MG EC tablet Take 81 mg by mouth daily. Swallow whole.    [provider]  B Complex-C (SUPER B COMPLEX PO) Take 1 tablet by mouth daily.    [provider]  budesonide-formoterol (SYMBICORT) 160-4.5 MCG/ACT inhaler Inhale 2 puffs into the lungs 2 (two) times daily.    [provider]  gabapentin (NEURONTIN) 400 MG capsule Take 1 capsule (400 mg total) by mouth 2 (two) times daily. 06/20/16   Earnestine Leys, MD  glipiZIDE (GLUCOTROL) 5 MG tablet Take 5 mg by mouth 2 (two) times daily before a meal.    [provider]   HYDROcodone-acetaminophen (NORCO) 7.5-325 MG tablet Take 1 tablet by mouth every 6 (six) hours as needed for moderate pain. 06/20/16   Earnestine Leys, MD  losartan-hydrochlorothiazide (HYZAAR) 100-25 MG tablet Take 1 tablet by mouth daily.    [provider]  metFORMIN (GLUCOPHAGE) 500 MG tablet Take by mouth 2 (two) times daily with a meal.    [provider]  Omega-3 Fatty Acids (FISH OIL PO) Take 2 capsules by mouth 2 (two) times daily.    [provider]  vitamin C (ASCORBIC ACID) 500 MG tablet Take 500 mg by mouth daily.    [provider]      PHYSICAL EXAMINATION:   VITAL SIGNS: Blood pressure 132/89, pulse 65, temperature 98.1 F (36.7 C), temperature source Oral, resp. rate 20, weight 99.3 kg, SpO2 93 %.  GENERAL:  73 y.o.-year-old patient lying in the bed with no acute distress.  Overweight, nontoxic-appearing EYES: Pupils equal, round, reactive to light and accommodation. No scleral icterus. Extraocular muscles intact.  HEENT: Head atraumatic, normocephalic. Oropharynx and nasopharynx clear.  NECK:  Supple, no jugular venous distention. No thyroid enlargement, no tenderness.  LUNGS: Diminished breath sounds with rhonchi bilaterally. No use of accessory muscles of respiration.  CARDIOVASCULAR: S1, S2 normal. No murmurs, rubs, or gallops.  ABDOMEN: Soft, nontender, nondistended. Bowel sounds present. No organomegaly or mass.  EXTREMITIES: No pedal edema, cyanosis, or clubbing.  NEUROLOGIC: Cranial nerves II through XII are intact. MAES. Gait not checked.  PSYCHIATRIC: The patient is alert and oriented x 3.  SKIN: No obvious rash, lesion, or ulcer.   LABORATORY PANEL:   CBC Recent Labs  Lab 02/04/19 1123  WBC 9.6  HGB 14.9  HCT 45.4  PLT 218  MCV 95.2  MCH 31.2  MCHC 32.8  RDW 14.5   ------------------------------------------------------------------------------------------------------------------  Chemistries  Recent Labs  Lab  02/04/19 1123  NA 139  K 4.0  CL 101  CO2 29  GLUCOSE 139*  BUN 21  CREATININE 0.74  CALCIUM 8.9   ------------------------------------------------------------------------------------------------------------------ CrCl cannot be calculated (Unknown ideal weight.). ------------------------------------------------------------------------------------------------------------------ No results for input(s): TSH, T4TOTAL, T3FREE, THYROIDAB in the last 72 hours.  Invalid input(s): FREET3   Coagulation profile No results for input(s): INR, PROTIME in the last 168 hours. ------------------------------------------------------------------------------------------------------------------- No results for input(s): DDIMER in the last 72 hours. -------------------------------------------------------------------------------------------------------------------  Cardiac Enzymes Recent Labs  Lab 02/04/19 1123  TROPONINI <0.03   ------------------------------------------------------------------------------------------------------------------ Invalid input(s): POCBNP  ---------------------------------------------------------------------------------------------------------------  Urinalysis    Component Value Date/Time   COLORURINE YELLOW 02/11/2012 [place] 02/11/2012 1608   LABSPEC 1.010 02/11/2012 1608   PHURINE 6.5 02/11/2012 1608   GLUCOSEU NEGATIVE 02/11/2012 1608   HGBUR 2+ 02/11/2012 1608   BILIRUBINUR NEGATIVE 02/11/2012 1608   KETONESUR NEGATIVE 02/11/2012 1608   PROTEINUR NEGATIVE 02/11/2012 1608   NITRITE NEGATIVE 02/11/2012 1608   LEUKOCYTESUR NEGATIVE 02/11/2012 1608     RADIOLOGY: Dg Chest 2 View  Result Date: 02/04/2019 CLINICAL DATA:  Shortness of breath EXAM: CHEST - 2 VIEW COMPARISON:  July 27, 2009 FINDINGS: There is an irregular  somewhat nodular appearing opacity in the left mid lung measuring 1.7 x 1.5 cm. There is mild scarring in this area.  Elsewhere lungs appear clear. Heart size and pulmonary vascularity are normal. No adenopathy. There is aortic atherosclerosis. There is mild degenerative change in the lower thoracic region. IMPRESSION: Irregular somewhat nodular opacity measuring 1.7 x 1.5 cm in the left mid lung with nearby scarring. This area warrants correlation with chest CT, ideally with intravenous contrast, to further evaluate given concern for possible neoplasm in this area. Lungs elsewhere clear. No adenopathy evident. Heart size within normal limits. Aortic Atherosclerosis (ICD10-I70.0). These results will be called to the ordering clinician or representative by the Radiologist Assistant, and communication documented in the PACS or zVision Dashboard. Electronically Signed   By: Lowella Grip III M.D.   On: 02/04/2019 11:05   Ct Chest W Contrast  Result Date: 02/04/2019 CLINICAL DATA:  Dyspnea for 2 months. COPD. Abnormal chest radiograph. EXAM: CT CHEST WITH CONTRAST TECHNIQUE: Multidetector CT imaging of the chest was performed during intravenous contrast administration. CONTRAST:  52mL OMNIPAQUE IOHEXOL 300 MG/ML  SOLN COMPARISON:  02/04/2019 chest radiograph. FINDINGS: Cardiovascular: Normal heart size. No significant pericardial effusion/thickening. Three-vessel coronary atherosclerosis. Atherosclerotic nonaneurysmal thoracic aorta. Top-normal caliber main pulmonary artery (3.1 cm diameter). There is a questionable central segmental filling defect in a left upper lobe pulmonary artery branch (series 6/image 53). Mediastinum/Nodes: No discrete thyroid nodules. Unremarkable esophagus. No pathologically enlarged axillary, mediastinal or hilar lymph nodes. Lungs/Pleura: No pneumothorax. No pleural effusion. Mild centrilobular and paraseptal emphysema with diffuse bronchial wall thickening. There are bandlike subpleural foci of consolidation in medial right upper lobe (series 7/image 33) and posterior left upper lobe (series  7/image 69), compatible with evolving postinfectious/postinflammatory scarring and/or atelectasis. No lung masses or significant pulmonary nodules. No central airway stenoses. Upper abdomen: There are multiple moderately enlarged upper retroperitoneal nodes in the gastrohepatic ligament, left para-aortic and aortocaval chains, partially visualized. For example a 2.4 cm gastrohepasic ligament node (series 6/image 145) and a 2.0 cm left para-aortic node (series 6/image 160). These nodes are newly enlarged since 02/11/2012 CT abdomen. Musculoskeletal: No aggressive appearing focal osseous lesions. Moderate thoracic spondylosis. IMPRESSION: 1. Questionable central segmental filling defect within a left upper lobe pulmonary artery branch, not optimally evaluated on this routine contrast-enhanced chest CT study, cannot exclude acute segmental pulmonary embolism. Dedicated PE protocol chest CT angiogram with IV contrast may be obtained. 2. Moderate upper retroperitoneal adenopathy, partially visualized, new since 2013 CT abdomen study, cannot exclude metastatic disease or lymphoma. Dedicated CT abdomen/pelvis with oral and IV contrast recommended for further evaluation. 3. Bandlike subpleural foci of consolidation in the medial right upper lobe and posterior left upper lobe, compatible with evolving postinfectious/postinflammatory scarring and/or atelectasis. 4. Mild centrilobular and paraseptal emphysema with diffuse bronchial wall thickening, compatible with the reported history of COPD. 5. Three-vessel coronary atherosclerosis. Aortic Atherosclerosis (ICD10-I70.0) and Emphysema (ICD10-J43.9). Critical Value/emergent results were called by telephone at the time of interpretation on 02/04/2019 at 12:59 pm to Dr. Arta Silence , who verbally acknowledged these results. Electronically Signed   By: Ilona Sorrel M.D.   On: 02/04/2019 13:03    EKG: Orders placed or performed during the hospital encounter of 02/04/19   . EKG 12-Lead  . EKG 12-Lead  . ED EKG  . ED EKG    IMPRESSION AND PLAN: *Acute hypoxic respiratory failure Most likely due to multifactorial process which includes possible acute left upper pulmonary embolism, acute on COPD exacerbation Admit  to regular nursing for bed, supplemental oxygen wean as tolerated  *Acute possible left upper pulmonary embolism Poorly visualized on CT-dedicated CT chest on pulmonary protocol recommended Heparin drip for now, check CT of the chest to evaluate for pulmonary embolism at noon tomorrow for further evaluation  *Acute on COPD exacerbation IV Solu-Medrol with tapering as tolerated, aggressive pulmonary toilet bronchodilator therapy, supplemental oxygen wean as tolerated  *Abnormal CT of the chest concerning for possible malignancy Consult Dr. Rao/oncology for expert opinion  *Chronic diabetes mellitus type 2 Slight scale insulin Accu-Cheks per routine  *Hypertension Continue home regiment  DVT prophylaxis-on heparin drip GI prophylaxis with PPI Disposition pending clinical course  All the records are reviewed and case discussed with ED provider. Management plans discussed with the patient, family and they are in agreement.  CODE STATUS:full    TOTAL TIME TAKING CARE OF THIS PATIENT: 40 minutes.    Avel Peace Tarri Guilfoil M.D on 02/04/2019   Between 7am to 6pm - Pager - [phone]  After 6pm go to [email or web address] - password [email or web address] [facility]  Office  [phone]  CC: Primary care physician; [name], MD   Note: This dictation was prepared with Dragon dictation along with smaller phrase technology. Any transcriptional errors that result from this process are unintentional.

## 2019-02-04 NOTE — Consult Note (Signed)
[facility] for Heparin Indication: pulmonary embolus  Allergies  Allergen Reactions  . Aspirin Nausea Only and Other (See Comments)    325 mg aspirin cause stomach upset  . Penicillins Rash    Patient Measurements: Weight: 219 lb (99.3 kg) Heparin Dosing Weight: 97.37  Vital Signs: Temp: 98.1 F (36.7 C) (06/10 1027) Temp Source: Oral (06/10 1027) BP: 132/89 (06/10 1200) Pulse Rate: 65 (06/10 1200)  Labs: Recent Labs    02/04/19 1123  HGB 14.9  HCT 45.4  PLT 218  CREATININE 0.74  TROPONINI <0.03    CrCl cannot be calculated (Unknown ideal weight.).   Medications:  (Not in a hospital admission)  Scheduled:  . sodium chloride flush  3 mL Intravenous Once   Infusions:   PRN:  Anti-infectives (From admission, onward)   None      Assessment: Pharmacy consulted for heparin for PE. Per pt no DOAC PTA. CBC stable.   Goal of Therapy:  Heparin level 0.3-0.7 units/ml Monitor platelets by anticoagulation protocol: Yes   Plan:  Give 5000 units bolus x 1 Start heparin infusion at 1600 units/hr Check anti-Xa level in 8 hours and daily while on heparin Continue to monitor H&H and platelets  Oswald Hillock, PharmD, BCPS 02/04/2019,3:04 PM

## 2019-02-04 NOTE — ED Provider Notes (Signed)
[facility] Emergency Department Provider Note ____________________________________________   First MD Initiated Contact with Patient 02/04/19 1041     (approximate)  I have reviewed the triage vital signs and the nursing notes.   HISTORY  Chief Complaint Shortness of Breath    HPI Julian Sparks is a 73 y.o. male with PMH as noted below including COPD who presents with shortness of breath over the last 1 to 2 months, gradual onset, worsening course, and associated with some bilateral lower extremity edema.  He states that the shortness of breath is worse with any exertion although when he is at rest, he has minimal shortness of breath.  He was previously on a steroid course which helped.  However, today he was following up with his regular doctor and on ambulation the O2 saturation dropped to the low 80s.  The patient is not on oxygen at home normally.  He denies fever or chest pain.  Past Medical History:  Diagnosis Date  . Chronic bronchitis ([place])   . Diabetes mellitus without complication ([place])   . Dyspnea    with exertion  . Hypertension     Patient Active Problem List   Diagnosis Date Noted  . Respiratory failure ([place]) 02/04/2019    Past Surgical History:  Procedure Laterality Date  . CIRCUMCISION    . SHOULDER ARTHROSCOPY WITH OPEN ROTATOR CUFF REPAIR Left 06/20/2016   Procedure: SHOULDER ARTHROSCOPY WITH OPEN ROTATOR CUFF REPAIR;  Surgeon: Earnestine Leys, MD;  Location: [facility] ORS;  Service: Orthopedics;  Laterality: Left;  . UPPER GI ENDOSCOPY      Prior to Admission medications   Medication Sig Start Date End Date Taking? Authorizing Provider  albuterol (VENTOLIN HFA) 108 (90 Base) MCG/ACT inhaler Inhale 2 puffs into the lungs as needed for wheezing or shortness of breath.    [provider]  aspirin 81 MG EC tablet Take 81 mg by mouth daily. Swallow whole.    [provider]  B Complex-C (SUPER B COMPLEX PO) Take 1 tablet  by mouth daily.    [provider]  budesonide-formoterol (SYMBICORT) 160-4.5 MCG/ACT inhaler Inhale 2 puffs into the lungs 2 (two) times daily.    [provider]  gabapentin (NEURONTIN) 400 MG capsule Take 1 capsule (400 mg total) by mouth 2 (two) times daily. 06/20/16   Earnestine Leys, MD  glipiZIDE (GLUCOTROL) 5 MG tablet Take 5 mg by mouth 2 (two) times daily before a meal.    [provider]  HYDROcodone-acetaminophen (NORCO) 7.5-325 MG tablet Take 1 tablet by mouth every 6 (six) hours as needed for moderate pain. 06/20/16   Earnestine Leys, MD  losartan-hydrochlorothiazide (HYZAAR) 100-25 MG tablet Take 1 tablet by mouth daily.    [provider]  metFORMIN (GLUCOPHAGE) 500 MG tablet Take by mouth 2 (two) times daily with a meal.    [provider]  Omega-3 Fatty Acids (FISH OIL PO) Take 2 capsules by mouth 2 (two) times daily.    [provider]  vitamin C (ASCORBIC ACID) 500 MG tablet Take 500 mg by mouth daily.    [provider]    Allergies Aspirin and Penicillins  Family History  Problem Relation Age of Onset  . Hypertension Mother     Social History Social History   Tobacco Use  . Smoking status: Former Research scientist (life sciences)  . Smokeless tobacco: Current User  Substance Use Topics  . Alcohol use: No  . Drug use: No    Review  of Systems  Constitutional: No fever. Eyes: No redness. ENT: No sore throat. Cardiovascular: Denies chest pain. Respiratory: Positive for shortness of breath. Gastrointestinal: No vomiting or diarrhea.  Genitourinary: Negative for dysuria.  Musculoskeletal: Negative for back pain. Skin: Negative for rash. Neurological: Negative for headache.   ____________________________________________   PHYSICAL EXAM:  VITAL SIGNS: ED Triage Vitals  Enc Vitals Group     BP 02/04/19 1027 (!) 176/73     Pulse Rate 02/04/19 1027 79     Resp 02/04/19 1027 20     Temp 02/04/19 1027 98.1 F (36.7 C)      Temp Source 02/04/19 1027 Oral     SpO2 02/04/19 1027 91 %     Weight 02/04/19 1024 219 lb (99.3 kg)     Height --      Head Circumference --      Peak Flow --      Pain Score 02/04/19 1023 5     Pain Loc --      Pain Edu? --      Excl. in [place]? --     Constitutional: Alert and oriented.  Relatively well appearing and in no acute distress. Eyes: Conjunctivae are normal.  Head: Atraumatic. Nose: No congestion/rhinnorhea. Mouth/Throat: Mucous membranes are moist.   Neck: Normal range of motion.  Cardiovascular: Normal rate, regular rhythm. Grossly normal heart sounds.  Good peripheral circulation. Respiratory: Normal respiratory effort.  No retractions. Lungs CTAB. Gastrointestinal: No distention.  Musculoskeletal: 1+ bilateral lower extremity edema.  Extremities warm and well perfused.  Neurologic:  Normal speech and language. No gross focal neurologic deficits are appreciated.  Skin:  Skin is warm and dry. No rash noted. Psychiatric: Mood and affect are normal. Speech and behavior are normal.  ____________________________________________   LABS (all labs ordered are listed, but only abnormal results are displayed)  Labs Reviewed  BASIC METABOLIC PANEL - Abnormal; Notable for the following components:      Result Value   Glucose, Bld 139 (*)    All other components within normal limits  CBC  TROPONIN I  BRAIN NATRIURETIC PEPTIDE  PROCALCITONIN  APTT  PROTIME-INR  HEPARIN LEVEL (UNFRACTIONATED)   ____________________________________________  EKG  ED ECG REPORT I, Arta Silence, the attending physician, personally viewed and interpreted this ECG.  Date: 02/04/2019 EKG Time: 1026 Rate: 79 Rhythm: normal sinus rhythm QRS Axis: Left axis Intervals: normal ST/T Wave abnormalities: normal Narrative Interpretation: no evidence of acute ischemia  ____________________________________________  RADIOLOGY  CXR: Left midlung nodule with no other acute findings  CT chest: Left upper lung pulmonary artery branch with a possible acute PE.  Retroperitoneal lymphadenopathy.  ____________________________________________   PROCEDURES  Procedure(s) performed: No  Procedures  Critical Care performed: No ____________________________________________   INITIAL IMPRESSION / ASSESSMENT AND PLAN / ED COURSE  Pertinent labs & imaging results that were available during my care of the patient were reviewed by me and considered in my medical decision making (see chart for details).  73 year old male with PMH as noted above presents with worsening shortness of breath over the last 2 months worse with exertion and associated with bilateral mild lower extremity edema.  He was sent from his PMDs office today after he was noted to be hypoxic with ambulation.  On exam the patient is overall well-appearing.  At rest, he has no significant increased work of breathing or respiratory distress.  O2 saturation is in the low 90s on room air and the blood pressure is elevated, with otherwise  normal vital signs.  Lungs are clear on exam.  He does have some mild bilateral lower extremity edema.  Differential includes COPD, possible new onset CHF, acute on chronic bronchitis, or pneumonia.  We will obtain chest x-ray, lab work-up, and reassess.  The patient has no fever or acute change in his symptoms to suggest COVID-19.  ----------------------------------------- 4:12 PM on 02/04/2019 -----------------------------------------  Chest x-ray showed no acute findings but a left upper lobe opacity concerning for a mass, and radiology recommended a CT chest with contrast.  CT revealed a finding compatible with acute PE, although limited due to not being an angiogram, as well as some retroperitoneal lymphadenopathy concerning for malignancy.  I informed the patient (as well as his daughter over the phone, per his request) about the findings.  I recommend that we admit the patient  given the significant hypoxia with any exertion and the concern for acute PE.  The patient and his daughter agree with the plan.  I signed him out to the hospitalist.  _________________________________  [name] was evaluated in Emergency Department on 02/04/2019 for the symptoms described in the history of present illness. He was evaluated in the context of the global COVID-19 pandemic, which necessitated consideration that the patient might be at risk for infection with the SARS-CoV-2 virus that causes COVID-19. Institutional protocols and algorithms that pertain to the evaluation of patients at risk for COVID-19 are in a state of rapid change based on information released by regulatory bodies including the CDC and federal and state organizations. These policies and algorithms were followed during the patient's care in the ED.   ____________________________________________   FINAL CLINICAL IMPRESSION(S) / ED DIAGNOSES  Final diagnoses:  Acute on chronic respiratory failure with hypoxia ([place])  Acute pulmonary embolism, unspecified pulmonary embolism type, unspecified whether acute cor pulmonale present ([place])      NEW MEDICATIONS STARTED DURING THIS VISIT:  New Prescriptions   No medications on file     Note:  This document was prepared using Dragon voice recognition software and may include unintentional dictation errors.    Arta Silence, MD 02/04/19 [phone]

## 2019-02-04 NOTE — ED Triage Notes (Signed)
SOB x 2 months.  Treated by PCP with steroids, which initially improved symptoms, but symptoms returned.  Seen by PCP today and sats dropped to 81% with excerption.  Patient is AAOx3.  Skin warm and dry. DOE noted.

## 2019-02-04 NOTE — Progress Notes (Signed)
Family Meeting Note  Advance Directive:yes  Today a meeting took place with the Patient.  Patient is able to participate   The following clinical team members were present during this meeting:MD  The following were discussed:Patient's diagnosis:73 y.o. male with a known history per below presented to the emergency room with shortness of breath for 2 months duration, patient was treated with course of steroids by primary care provider which patient did improve but began to worsen once again after steroids were discontinued, patient seen by his primary care provider today for dyspnea on exertion, primary care provider sent patient to the emergency room given O2 saturation 81% on room air, in the emergency room patient was found to have questionable left upper pulmonary embolism on CT-repeat dedicated CTA of the chest was warranted to evaluate for pulmonary embolism/noted moderate upper retroperitoneal adenopathy concerning for metastasis/lymphoma/COPD changes, chest x-ray noted for left 1.7 x 1.5 cm nodule, BMP was normal, hospitalist asked to admit, patient valuated emergency room, no apparent distress, resting comfortably in bed, patient is now been admitted for acute abnormal CT chest concerning for pulmonary embolism, acute on COPD exacerbation, concern for undiagnosed cancer, Patient's progosis: Unable to determine and Goals for treatment: Full Code  Additional follow-up to be provided: prn  Time spent during discussion:20 minutes  Gorden Harms, MD

## 2019-02-05 ENCOUNTER — Inpatient Hospital Stay: Payer: Medicare PPO

## 2019-02-05 ENCOUNTER — Inpatient Hospital Stay (HOSPITAL_COMMUNITY)
Admit: 2019-02-05 | Discharge: 2019-02-05 | Disposition: A | Discharge status: Home / Self Care | Payer: Medicare PPO | Attending: Internal Medicine | Admitting: Internal Medicine

## 2019-02-05 DIAGNOSIS — I5031 Acute diastolic (congestive) heart failure: Secondary | ICD-10-CM

## 2019-02-05 DIAGNOSIS — J42 Unspecified chronic bronchitis: Secondary | ICD-10-CM

## 2019-02-05 DIAGNOSIS — R590 Localized enlarged lymph nodes: Secondary | ICD-10-CM

## 2019-02-05 DIAGNOSIS — F17290 Nicotine dependence, other tobacco product, uncomplicated: Secondary | ICD-10-CM

## 2019-02-05 DIAGNOSIS — E119 Type 2 diabetes mellitus without complications: Secondary | ICD-10-CM

## 2019-02-05 DIAGNOSIS — R918 Other nonspecific abnormal finding of lung field: Secondary | ICD-10-CM

## 2019-02-05 DIAGNOSIS — Z880 Allergy status to penicillin: Secondary | ICD-10-CM

## 2019-02-05 DIAGNOSIS — Z886 Allergy status to analgesic agent status: Secondary | ICD-10-CM

## 2019-02-05 DIAGNOSIS — J9621 Acute and chronic respiratory failure with hypoxia: Secondary | ICD-10-CM

## 2019-02-05 LAB — GLUCOSE, CAPILLARY
Glucose-Capillary: 181 mg/dL — ABNORMAL HIGH (ref 70–99)
Glucose-Capillary: 186 mg/dL — ABNORMAL HIGH (ref 70–99)
Glucose-Capillary: 174 mg/dL — ABNORMAL HIGH (ref 70–99)
Glucose-Capillary: 141 mg/dL — ABNORMAL HIGH (ref 70–99)

## 2019-02-05 LAB — CBC
HCT: 48.5 % (ref 39.0–52.0)
Hemoglobin: 16.1 g/dL (ref 13.0–17.0)
MCH: 31.3 pg (ref 26.0–34.0)
MCHC: 33.2 g/dL (ref 30.0–36.0)
MCV: 94.2 fL (ref 80.0–100.0)
Platelets: 224 10*3/uL (ref 150–400)
RBC: 5.15 MIL/uL (ref 4.22–5.81)
RDW: 14.3 % (ref 11.5–15.5)
WBC: 12.8 10*3/uL — ABNORMAL HIGH (ref 4.0–10.5)
nRBC: 0 % (ref 0.0–0.2)

## 2019-02-05 LAB — BASIC METABOLIC PANEL
Anion gap: 11 (ref 5–15)
BUN: 22 mg/dL (ref 8–23)
CO2: 29 mmol/L (ref 22–32)
Calcium: 9.1 mg/dL (ref 8.9–10.3)
Chloride: 98 mmol/L (ref 98–111)
Creatinine, Ser: 0.9 mg/dL (ref 0.61–1.24)
GFR calc Af Amer: >60 mL/min (ref >60)
GFR calc non Af Amer: >60 mL/min (ref >60)
Glucose, Bld: 229 mg/dL — ABNORMAL HIGH (ref 70–99)
Potassium: 4.4 mmol/L (ref 3.5–5.1)
Sodium: 138 mmol/L (ref 135–145)

## 2019-02-05 LAB — ECHOCARDIOGRAM COMPLETE
Height: 66 in
Weight: 3504 oz

## 2019-02-05 LAB — HEPARIN LEVEL (UNFRACTIONATED)
Heparin Unfractionated: 0.83 IU/mL — ABNORMAL HIGH (ref 0.30–0.70)
Heparin Unfractionated: 0.53 IU/mL (ref 0.30–0.70)

## 2019-02-05 LAB — HEMOGLOBIN A1C
Hgb A1c MFr Bld: 7.3 % — ABNORMAL HIGH (ref 4.8–5.6)
Mean Plasma Glucose: 162.81 mg/dL

## 2019-02-05 MED ORDER — FUROSEMIDE 10 MG/ML IJ SOLN
20.0000 mg | Freq: Once | INTRAMUSCULAR | Status: AC
  Administered 2019-02-05: 20 mg via INTRAVENOUS
  Filled 2019-02-05: qty 2

## 2019-02-05 MED ORDER — IPRATROPIUM-ALBUTEROL 0.5-2.5 (3) MG/3ML IN SOLN
3.0000 mL | Freq: Four times a day (QID) | RESPIRATORY_TRACT | Status: DC | PRN
  Filled 2019-02-05: qty 3

## 2019-02-05 MED ORDER — ENOXAPARIN SODIUM 40 MG/0.4ML ~~LOC~~ SOLN
40.0000 mg | SUBCUTANEOUS | Status: DC
  Administered 2019-02-05: 21:00:00 40 mg via SUBCUTANEOUS
  Filled 2019-02-05: qty 0.4

## 2019-02-05 MED ORDER — IOHEXOL 240 MG/ML SOLN: 25.0000 mL | INTRAMUSCULAR | Status: AC

## 2019-02-05 MED ORDER — IOHEXOL 350 MG/ML SOLN
75.0000 mL | Freq: Once | INTRAVENOUS | Status: AC | PRN
  Administered 2019-02-05: 75 mL via INTRAVENOUS

## 2019-02-05 MED ORDER — METHYLPREDNISOLONE SODIUM SUCC 40 MG IJ SOLR
40.0000 mg | Freq: Three times a day (TID) | INTRAMUSCULAR | Status: DC
  Administered 2019-02-05 – 2019-02-06 (×3): 40 mg via INTRAVENOUS
  Filled 2019-02-05 (×3): qty 1

## 2019-02-05 MED ORDER — DOXYCYCLINE HYCLATE 100 MG PO TABS
100.0000 mg | ORAL_TABLET | Freq: Two times a day (BID) | ORAL | Status: DC
  Administered 2019-02-05 – 2019-02-06 (×3): 100 mg via ORAL
  Filled 2019-02-05 (×3): qty 1

## 2019-02-05 NOTE — Progress Notes (Signed)
Patient oxygen sats 89 on room air, put on 2L of oxygen and sats at 93%. Day nurse aware Stacie Glaze, RN

## 2019-02-05 NOTE — Consult Note (Signed)
[facility] for Heparin Indication: pulmonary embolus  Allergies  Allergen Reactions  . Aspirin Nausea Only and Other (See Comments)    325 mg aspirin cause stomach upset  . Penicillins Rash    Patient Measurements: Height: 5\' 6"  (167.6 cm) Weight: 219 lb (99.3 kg) IBW/kg (Calculated) : 63.8 Heparin Dosing Weight: 97kg  Vital Signs: Temp: 97.9 F (36.6 C) (06/10 2010) BP: 147/88 (06/10 2010) Pulse Rate: 84 (06/10 2010)  Labs: Recent Labs    02/04/19 1123 02/04/19 1630 02/05/19 0207  HGB 14.9  --   --   HCT 45.4  --   --   PLT 218  --   --   APTT  --  32  --   LABPROT  --  12.7  --   INR  --  1.0  --   HEPARINUNFRC  --   --  0.83*  CREATININE 0.74  --  0.90  TROPONINI <0.03  --   --     Estimated Creatinine Clearance: 79.4 mL/min (by C-G formula based on SCr of 0.9 mg/dL).   Medications:  Medications Prior to Admission  Medication Sig Dispense Refill Last Dose  . albuterol (VENTOLIN HFA) 108 (90 Base) MCG/ACT inhaler Inhale 2 puffs into the lungs as needed for wheezing or shortness of breath.   02/04/2019 at Unknown time  . aspirin 81 MG EC tablet Take 81 mg by mouth daily. Swallow whole.   02/04/2019 at Unknown time  . atorvastatin (LIPITOR) 40 MG tablet Take 40 mg by mouth every evening.   02/03/2019 at Unknown time  . B Complex-C (SUPER B COMPLEX PO) Take 1 tablet by mouth daily.   02/04/2019 at Unknown time  . budesonide-formoterol (SYMBICORT) 160-4.5 MCG/ACT inhaler Inhale 2 puffs into the lungs 2 (two) times daily.   02/04/2019 at Unknown time  . glipiZIDE (GLUCOTROL) 5 MG tablet Take 5 mg by mouth 2 (two) times daily before a meal.   02/04/2019 at Unknown time  . losartan-hydrochlorothiazide (HYZAAR) 100-25 MG tablet Take 1 tablet by mouth daily.   02/04/2019 at Unknown time  . metFORMIN (GLUCOPHAGE) 500 MG tablet Take by mouth 2 (two) times daily with a meal.   02/04/2019 at Unknown time  . Omega-3 Fatty Acids (FISH OIL PO) Take  2 capsules by mouth 2 (two) times daily.   02/03/2019 at Unknown time  . traZODone (DESYREL) 50 MG tablet Take 100 mg by mouth at bedtime.   02/03/2019 at Unknown time  . vitamin C (ASCORBIC ACID) 500 MG tablet Take 500 mg by mouth daily.   02/04/2019 at Unknown time  . cyclobenzaprine (FLEXERIL) 10 MG tablet Take 10 mg by mouth at bedtime.   prn at prn  . fluticasone (FLONASE) 50 MCG/ACT nasal spray Place 1 spray into both nostrils 2 (two) times a day.   prn at prn  . gabapentin (NEURONTIN) 100 MG capsule Take 200 mg by mouth 3 (three) times daily.     Marland Kitchen gabapentin (NEURONTIN) 400 MG capsule Take 1 capsule (400 mg total) by mouth 2 (two) times daily. (Patient not taking: Reported on 02/04/2019) 60 capsule 3 Not Taking at Unknown time  . HYDROcodone-acetaminophen (NORCO) 7.5-325 MG tablet Take 1 tablet by mouth every 6 (six) hours as needed for moderate pain. (Patient not taking: Reported on 02/04/2019) 50 tablet 0 Not Taking at Unknown time   Scheduled:  . aspirin EC  81 mg Oral Daily  . gabapentin  400 mg Oral  BID  . glipiZIDE  5 mg Oral BID AC  . guaiFENesin  600 mg Oral BID  . losartan  100 mg Oral Daily   And  . hydrochlorothiazide  25 mg Oral Daily  . insulin aspart  0-20 Units Subcutaneous TID WC  . insulin aspart  0-5 Units Subcutaneous QHS  . ipratropium-albuterol  3 mL Nebulization Q6H  . methylPREDNISolone (SOLU-MEDROL) injection  60 mg Intravenous Q6H  . mometasone-formoterol  2 puff Inhalation BID  . sodium chloride flush  10-40 mL Intracatheter Q12H  . sodium chloride flush  3 mL Intravenous Q12H   Infusions:  . sodium chloride    . dextrose 5 % and 0.45 % NaCl with KCl 10 mEq/L 50 mL/hr at 02/04/19 1833  . heparin 1,600 Units/hr (02/04/19 1833)   PRN:  Anti-infectives (From admission, onward)   None      Assessment: Pharmacy consulted for heparin for PE. Per pt no DOAC PTA. CBC stable.  6/10 Heparin infusion started @ 1600 units/hr.    Goal of Therapy:  Heparin level  0.3-0.7 units/ml Monitor platelets by anticoagulation protocol: Yes   Plan:  6/11 @ 0200 HL: 0.83. Level is supratherapeutic.  Will decrease heparin infusion to rate of 1400 units/hr Check anti-Xa level in 6 hours and daily while on heparin Continue to monitor H&H and platelets  Buffi Ewton Lorrin Mais, PharmD, BCPS 02/05/2019,2:55 AM

## 2019-02-05 NOTE — Consult Note (Signed)
Hematology/Oncology Consult note [facility] [phone] [phone]  Patient Care Team: [name], MD as PCP - General (Internal Medicine)   Name of the patient: Julian Sparks  [id number]  [birth year]    Reason for consult: Intra-abdominal adenopathy   Requesting physician: Dr. Jerelyn Charles  Date of visit: 02/05/2019    History of presenting illness- Patient is a 73 year old male with a past medical history significant for diabetes who presented to the ER with worsening shortness of breath he underwent CT chest with contrast which showed a bandlike consolidation in the right upper lobe compatible with postinfectious/postinflammatory scarring.  Questionable central segmental filling defect cannot rule out acute PE.  Multiple moderately enlarged upper abdominal retroperitoneal lymph nodes ranging from 2 to 2.5 cm in the para-aortic and the gastrohepatic ligament concerning for lymphoma.  This was followed by a CT PE which did not reveal any PE CT abdomen and pelvis with contrast showed bulky retroperitoneal lymph node with 2.9 cm 1.6 cm interaortocaval lymph node, 1.9 cm left-sided retroperitoneal lymph node and scattered mesenteric lymph nodes as well.  No other areas of adenopathy noted.  Reports his breathing is improving after steroids and antibiotics  ECOG PS- 1  Pain scale- 0   Review of systems- Review of Systems  Constitutional: Positive for malaise/fatigue. Negative for chills, fever and weight loss.  HENT: Negative for congestion, ear discharge and nosebleeds.   Eyes: Negative for blurred vision.  Respiratory: Positive for shortness of breath. Negative for cough, hemoptysis, sputum production and wheezing.   Cardiovascular: Negative for chest pain, palpitations, orthopnea and claudication.  Gastrointestinal: Negative for abdominal pain, blood in stool, constipation, diarrhea, heartburn, melena, nausea and vomiting.  Genitourinary:  Negative for dysuria, flank pain, frequency, hematuria and urgency.  Musculoskeletal: Negative for back pain, joint pain and myalgias.  Skin: Negative for rash.  Neurological: Negative for dizziness, tingling, focal weakness, seizures, weakness and headaches.  Endo/Heme/Allergies: Does not bruise/bleed easily.  Psychiatric/Behavioral: Negative for depression and suicidal ideas. The patient does not have insomnia.     Allergies  Allergen Reactions   Aspirin Nausea Only and Other (See Comments)    325 mg aspirin cause stomach upset   Penicillins Rash    Patient Active Problem List   Diagnosis Date Noted   Respiratory failure ([place]) 02/04/2019     Past Medical History:  Diagnosis Date   Chronic bronchitis ([place])    Diabetes mellitus without complication ([place])    Dyspnea    with exertion   Hypertension      Past Surgical History:  Procedure Laterality Date   CIRCUMCISION     SHOULDER ARTHROSCOPY WITH OPEN ROTATOR CUFF REPAIR Left 06/20/2016   Procedure: SHOULDER ARTHROSCOPY WITH OPEN ROTATOR CUFF REPAIR;  Surgeon: Earnestine Leys, MD;  Location: [facility] ORS;  Service: Orthopedics;  Laterality: Left;   UPPER GI ENDOSCOPY      Social History   Socioeconomic History   Marital status: Married    Spouse name: Not on file   Number of children: Not on file   Years of education: Not on file   Highest education level: Not on file  Occupational History   Not on file  Social Needs   Financial resource strain: Not on file   Food insecurity    Worry: Not on file    Inability: Not on file   Transportation needs    Medical: Not on file    Non-medical: Not on file  Tobacco Use   Smoking  status: Former Smoker   Smokeless tobacco: Current User  Substance and Sexual Activity   Alcohol use: No   Drug use: No   Sexual activity: Not on file  Lifestyle   Physical activity    Days per week: Not on file    Minutes per session: Not on file   Stress: Not on file   Relationships   Social connections    Talks on phone: Not on file    Gets together: Not on file    Attends religious service: Not on file    Active member of club or organization: Not on file    Attends meetings of clubs or organizations: Not on file    Relationship status: Not on file   Intimate partner violence    Fear of current or ex partner: Not on file    Emotionally abused: Not on file    Physically abused: Not on file    Forced sexual activity: Not on file  Other Topics Concern   Not on file  Social History Narrative   Not on file     Family History  Problem Relation Age of Onset   Hypertension Mother      Current Facility-Administered Medications:    0.9 %  sodium chloride infusion, 250 mL, Intravenous, PRN, Salary, Montell D, MD   acetaminophen (TYLENOL) tablet 650 mg, 650 mg, Oral, Q6H PRN **OR** acetaminophen (TYLENOL) suppository 650 mg, 650 mg, Rectal, Q6H PRN, Salary, Montell D, MD   aspirin EC tablet 81 mg, 81 mg, Oral, Daily, Salary, Montell D, MD, 81 mg at 02/05/19 1225   doxycycline (VIBRA-TABS) tablet 100 mg, 100 mg, Oral, Q12H, Mody, Sital, MD, 100 mg at 02/05/19 1308   enoxaparin (LOVENOX) injection 40 mg, 40 mg, Subcutaneous, Q24H, Mody, Sital, MD   gabapentin (NEURONTIN) capsule 400 mg, 400 mg, Oral, BID, Salary, Montell D, MD, 400 mg at 02/05/19 1225   glipiZIDE (GLUCOTROL) tablet 5 mg, 5 mg, Oral, BID AC, Salary, Montell D, MD, 5 mg at 02/05/19 1225   guaiFENesin (MUCINEX) 12 hr tablet 600 mg, 600 mg, Oral, BID, Salary, Montell D, MD, 600 mg at 02/05/19 1225   losartan (COZAAR) tablet 100 mg, 100 mg, Oral, Daily, 100 mg at 02/05/19 1225 **AND** hydrochlorothiazide (HYDRODIURIL) tablet 25 mg, 25 mg, Oral, Daily, Salary, Montell D, MD, 25 mg at 02/05/19 1225   HYDROcodone-acetaminophen (NORCO) 7.5-325 MG per tablet 1 tablet, 1 tablet, Oral, Q6H PRN, Salary, Montell D, MD   insulin aspart (novoLOG) injection 0-20 Units, 0-20 Units,  Subcutaneous, TID WC, Salary, Montell D, MD, 4 Units at 02/05/19 1308   insulin aspart (novoLOG) injection 0-5 Units, 0-5 Units, Subcutaneous, QHS, Salary, Montell D, MD   ipratropium-albuterol (DUONEB) 0.5-2.5 (3) MG/3ML nebulizer solution 3 mL, 3 mL, Nebulization, Q6H, Salary, Montell D, MD, 3 mL at 02/05/19 1326   ipratropium-albuterol (DUONEB) 0.5-2.5 (3) MG/3ML nebulizer solution 3 mL, 3 mL, Nebulization, Q6H PRN, Lance Coon, MD   methylPREDNISolone sodium succinate (SOLU-MEDROL) 40 mg/mL injection 40 mg, 40 mg, Intravenous, Q8H, Mody, Sital, MD, 40 mg at 02/05/19 1308   mometasone-formoterol (DULERA) 200-5 MCG/ACT inhaler 2 puff, 2 puff, Inhalation, BID, Salary, Montell D, MD, 2 puff at 02/05/19 0849   ondansetron (ZOFRAN) tablet 4 mg, 4 mg, Oral, Q6H PRN **OR** ondansetron (ZOFRAN) injection 4 mg, 4 mg, Intravenous, Q6H PRN, Salary, Montell D, MD   polyethylene glycol (MIRALAX / GLYCOLAX) packet 17 g, 17 g, Oral, Daily PRN, Salary, Avel Peace, MD   sodium  chloride flush (NS) 0.9 % injection 10-40 mL, 10-40 mL, Intracatheter, Q12H, Salary, Montell D, MD, 10 mL at 02/04/19 2242   sodium chloride flush (NS) 0.9 % injection 10-40 mL, 10-40 mL, Intracatheter, PRN, Salary, Montell D, MD   sodium chloride flush (NS) 0.9 % injection 3 mL, 3 mL, Intravenous, Q12H, Salary, Montell D, MD, 3 mL at 02/05/19 1227   sodium chloride flush (NS) 0.9 % injection 3 mL, 3 mL, Intravenous, PRN, Gorden Harms, MD   Physical exam:  Vitals:   02/05/19 0518 02/05/19 0535 02/05/19 0827 02/05/19 1349  BP:   (!) 148/87 (!) 160/66  Pulse:   82 100  Resp:   19 (!) 22  Temp:   (!) 97.5 F (36.4 C) 97.8 F (36.6 C)  TempSrc:   Oral Oral  SpO2: (!) 89% 93%  95%  Weight:      Height:       Physical Exam Constitutional:      General: He is not in acute distress. HENT:     Head: Normocephalic and atraumatic.  Eyes:     Pupils: Pupils are equal, round, and reactive to light.  Neck:      Musculoskeletal: Normal range of motion.  Cardiovascular:     Rate and Rhythm: Normal rate and regular rhythm.     Heart sounds: Normal heart sounds.  Pulmonary:     Effort: Pulmonary effort is normal.     Breath sounds: Normal breath sounds.  Abdominal:     General: Bowel sounds are normal.     Palpations: Abdomen is soft.  Lymphadenopathy:     Comments: No palpable cervical, supraclavicular, axillary or inguinal adenopathy   Skin:    General: Skin is warm and dry.  Neurological:     Mental Status: He is alert and oriented to person, place, and time.        CMP Latest Ref Rng & Units 02/05/2019  Glucose 70 - 99 mg/dL 229(H)  BUN 8 - 23 mg/dL 22  Creatinine 0.61 - 1.24 mg/dL 0.90  Sodium 135 - 145 mmol/L 138  Potassium 3.5 - 5.1 mmol/L 4.4  Chloride 98 - 111 mmol/L 98  CO2 22 - 32 mmol/L 29  Calcium 8.9 - 10.3 mg/dL 9.1  Total Protein 6.4 - 8.2 g/dL -  Total Bilirubin 0.2 - 1.0 mg/dL -  Alkaline Phos 50 - 136 Unit/L -  AST 15 - 37 Unit/L -  ALT U/L -   CBC Latest Ref Rng & Units 02/05/2019  WBC 4.0 - 10.5 K/uL 12.8(H)  Hemoglobin 13.0 - 17.0 g/dL 16.1  Hematocrit 39.0 - 52.0 % 48.5  Platelets 150 - 400 K/uL 224    @IMAGES @  Dg Chest 2 View  Result Date: 02/04/2019 CLINICAL DATA:  Shortness of breath EXAM: CHEST - 2 VIEW COMPARISON:  July 27, 2009 FINDINGS: There is an irregular somewhat nodular appearing opacity in the left mid lung measuring 1.7 x 1.5 cm. There is mild scarring in this area. Elsewhere lungs appear clear. Heart size and pulmonary vascularity are normal. No adenopathy. There is aortic atherosclerosis. There is mild degenerative change in the lower thoracic region. IMPRESSION: Irregular somewhat nodular opacity measuring 1.7 x 1.5 cm in the left mid lung with nearby scarring. This area warrants correlation with chest CT, ideally with intravenous contrast, to further evaluate given concern for possible neoplasm in this area. Lungs elsewhere clear. No  adenopathy evident. Heart size within normal limits. Aortic Atherosclerosis (ICD10-I70.0). These results will  be called to the ordering clinician or representative by the Radiologist Assistant, and communication documented in the PACS or zVision Dashboard. Electronically Signed   By: Lowella Grip III M.D.   On: 02/04/2019 11:05   Ct Chest W Contrast  Result Date: 02/04/2019 CLINICAL DATA:  Dyspnea for 2 months. COPD. Abnormal chest radiograph. EXAM: CT CHEST WITH CONTRAST TECHNIQUE: Multidetector CT imaging of the chest was performed during intravenous contrast administration. CONTRAST:  36mL OMNIPAQUE IOHEXOL 300 MG/ML  SOLN COMPARISON:  02/04/2019 chest radiograph. FINDINGS: Cardiovascular: Normal heart size. No significant pericardial effusion/thickening. Three-vessel coronary atherosclerosis. Atherosclerotic nonaneurysmal thoracic aorta. Top-normal caliber main pulmonary artery (3.1 cm diameter). There is a questionable central segmental filling defect in a left upper lobe pulmonary artery branch (series 6/image 53). Mediastinum/Nodes: No discrete thyroid nodules. Unremarkable esophagus. No pathologically enlarged axillary, mediastinal or hilar lymph nodes. Lungs/Pleura: No pneumothorax. No pleural effusion. Mild centrilobular and paraseptal emphysema with diffuse bronchial wall thickening. There are bandlike subpleural foci of consolidation in medial right upper lobe (series 7/image 33) and posterior left upper lobe (series 7/image 69), compatible with evolving postinfectious/postinflammatory scarring and/or atelectasis. No lung masses or significant pulmonary nodules. No central airway stenoses. Upper abdomen: There are multiple moderately enlarged upper retroperitoneal nodes in the gastrohepatic ligament, left para-aortic and aortocaval chains, partially visualized. For example a 2.4 cm gastrohepasic ligament node (series 6/image 145) and a 2.0 cm left para-aortic node (series 6/image 160). These  nodes are newly enlarged since 02/11/2012 CT abdomen. Musculoskeletal: No aggressive appearing focal osseous lesions. Moderate thoracic spondylosis. IMPRESSION: 1. Questionable central segmental filling defect within a left upper lobe pulmonary artery branch, not optimally evaluated on this routine contrast-enhanced chest CT study, cannot exclude acute segmental pulmonary embolism. Dedicated PE protocol chest CT angiogram with IV contrast may be obtained. 2. Moderate upper retroperitoneal adenopathy, partially visualized, new since 2013 CT abdomen study, cannot exclude metastatic disease or lymphoma. Dedicated CT abdomen/pelvis with oral and IV contrast recommended for further evaluation. 3. Bandlike subpleural foci of consolidation in the medial right upper lobe and posterior left upper lobe, compatible with evolving postinfectious/postinflammatory scarring and/or atelectasis. 4. Mild centrilobular and paraseptal emphysema with diffuse bronchial wall thickening, compatible with the reported history of COPD. 5. Three-vessel coronary atherosclerosis. Aortic Atherosclerosis (ICD10-I70.0) and Emphysema (ICD10-J43.9). Critical Value/emergent results were called by telephone at the time of interpretation on 02/04/2019 at 12:59 pm to Dr. Arta Silence , who verbally acknowledged these results. Electronically Signed   By: Ilona Sorrel M.D.   On: 02/04/2019 13:03   Ct Angio Chest Pe W Or Wo Contrast  Result Date: 02/05/2019 CLINICAL DATA:  Possible pulmonary embolism suspected on CT scan from yesterday. EXAM: CT ANGIOGRAPHY CHEST WITH CONTRAST TECHNIQUE: Multidetector CT imaging of the chest was performed using the standard protocol during bolus administration of intravenous contrast. Multiplanar CT image reconstructions and MIPs were obtained to evaluate the vascular anatomy. CONTRAST:  29mL OMNIPAQUE IOHEXOL 350 MG/ML SOLN COMPARISON:  Chest CT 02/04/2019 FINDINGS: Cardiovascular: The heart is normal in size. No  pericardial effusion. There is mild tortuosity and calcification of the thoracic aorta but no aneurysm or dissection. The branch vessels are patent. Stable scattered coronary artery calcifications. The pulmonary arterial tree is well opacified. No filling defects to suggest pulmonary embolism. Mediastinum/Nodes: No mediastinal or hilar mass or lymphadenopathy. Small scattered lymph nodes are stable. The esophagus is grossly normal. Lungs/Pleura: Emphysematous changes and pulmonary scarring along with patchy areas of atelectasis. No focal infiltrates or worrisome pulmonary lesions.  No pleural effusions. Upper Abdomen: Upper abdominal lymphadenopathy is again demonstrated. Musculoskeletal: No chest wall mass, supraclavicular or axillary adenopathy. The bony thorax is intact. No worrisome bone lesions. Review of the MIP images confirms the above findings. IMPRESSION: 1. No CT findings for pulmonary embolism. 2. Emphysematous changes and pulmonary scarring along with patchy areas of atelectasis but no worrisome pulmonary lesions. 3. No mediastinal or hilar mass or adenopathy. 4. Upper abdominal lymphadenopathy. Aortic Atherosclerosis (ICD10-I70.0) and Emphysema (ICD10-J43.9). Electronically Signed   By: Marijo Sanes M.D.   On: 02/05/2019 11:39   Ct Abdomen Pelvis W Contrast  Result Date: 02/05/2019 CLINICAL DATA:  Adenopathy noted on prior chest CT. EXAM: CT ABDOMEN AND PELVIS WITH CONTRAST TECHNIQUE: Multidetector CT imaging of the abdomen and pelvis was performed using the standard protocol following bolus administration of intravenous contrast. CONTRAST:  12mL OMNIPAQUE IOHEXOL 350 MG/ML SOLN COMPARISON:  Abdominal CT scan 02/11/2012. FINDINGS: Lower chest: The lung bases are clear of an acute process. The heart is normal in size. The distal esophagus is grossly normal. Hepatobiliary: No focal hepatic lesions or intrahepatic biliary dilatation. The gallbladder appears normal. There is contrast in the gallbladder  secondary to vicarious excretion from yesterday CT scan. No common bile duct dilatation. Pancreas: Prominent fatty interstices but no mass, inflammation or ductal dilatation. Spleen: Normal size.  No focal lesions. Adrenals/Urinary Tract: The adrenal glands and kidneys are unremarkable. The bladder is normal. Stomach/Bowel: The stomach, duodenum, small bowel and colon are unremarkable. No acute inflammatory changes, mass lesions or obstructive findings. The terminal ileum and appendix are normal. Diffuse colonic diverticulosis and moderate stool throughout the colon but no findings for acute diverticulitis. Vascular/Lymphatic: 2.2 cm gastrohepatic ligament node on image 20. 2 cm celiac axis lymph node on image number 20. Bulk E retroperitoneal lymph node with 2.9 cm right-sided retroperitoneal lymph node on image number 37. 1.6 cm inter aortocaval lymph node on image number 34. 1.9 cm left-sided retroperitoneal lymph node on image number 27. Small scattered mesenteric lymph nodes are also noted. No pelvic or inguinal lymphadenopathy. Reproductive: The prostate gland and seminal vesicles are unremarkable. There is mild median lobe hypertrophy of the prostate gland. Other: No inguinal mass or adenopathy. Small inguinal hernias containing fat. Small periumbilical abdominal wall hernia containing fat. Musculoskeletal: No significant bony findings. IMPRESSION: 1. Abdominal lymphadenopathy as detailed above. Findings most suspicious for lymphoproliferative process such as lymphoma. I do not see any easily biopsied axillary or inguinal lymph nodes. 2. No findings for a primary abdominal/pelvic neoplasm to suggest this is metastatic adenopathy. Electronically Signed   By: Marijo Sanes M.D.   On: 02/05/2019 11:47    Assessment and plan- Patient is a 73 y.o. male presenting with acute hypoxic respiratory failure incidentally found to have upper abdominal adenopathy  I have reviewed CT abdomen pelvis images independently  and discussed findings with the patient.  Patient will need a PET CT scan as an outpatient to evaluate this lymph nodes which I will arrange this will be followed by a CT-guided retroperitoneal lymph node biopsy which will need to be done after the PET CT scan.  Baseline CBC and BMP is normal.  Overall findings are concerning for lymphoma since there is no other primary malignancy noted.  I will arrange follow-up with me as an outpatient  Acute hypoxic respiratory failure: Currently being treated for possible COPD exacerbation.  No PE on CTPE    Visit Diagnosis 1. Acute on chronic respiratory failure with hypoxia (HCC)  2. Acute pulmonary embolism, unspecified pulmonary embolism type, unspecified whether acute cor pulmonale present ([place])     Dr. Randa Evens, MD, MPH [facility] [phone] 02/05/2019 4:31 PM

## 2019-02-05 NOTE — Progress Notes (Signed)
Julian Sparks NAME: Julian Sparks    MR#:  [id number]  DATE OF BIRTH:  12-21-1945  SUBJECTIVE:   patient with SOB no cough or fevers no wheezing  REVIEW OF SYSTEMS:    Review of Systems  Constitutional: Negative for fever, chills weight loss HENT: Negative for ear pain, nosebleeds, congestion, facial swelling, rhinorrhea, neck pain, neck stiffness and ear discharge.   Respiratory: Negative for cough, ++ improved shortness of breath, no wheezing  Cardiovascular: Negative for chest pain, palpitations and ++leg swelling.  Gastrointestinal: Negative for heartburn, abdominal pain, vomiting, diarrhea or consitpation Genitourinary: Negative for dysuria, urgency, frequency, hematuria Musculoskeletal: Negative for back pain or joint pain Neurological: Negative for dizziness, seizures, syncope, focal weakness,  numbness and headaches.  Hematological: Does not bruise/bleed easily.  Psychiatric/Behavioral: Negative for hallucinations, confusion, dysphoric mood    Tolerating Diet: yes      DRUG ALLERGIES:   Allergies  Allergen Reactions  . Aspirin Nausea Only and Other (See Comments)    325 mg aspirin cause stomach upset  . Penicillins Rash    VITALS:  Blood pressure (!) 148/87, pulse 82, temperature (!) 97.5 F (36.4 C), temperature source Oral, resp. rate 19, height 5\' 6"  (1.676 m), weight 99.3 kg, SpO2 93 %.  PHYSICAL EXAMINATION:  Constitutional: Appears well-developed and well-nourished. No distress. HENT: Normocephalic. Marland Kitchen Oropharynx is clear and moist.  Eyes: Conjunctivae and EOM are normal. PERRLA, no scleral icterus.  Neck: Normal ROM. Neck supple. No JVD. No tracheal deviation. CVS: RRR, S1/S2 +, no murmurs, no gallops, no carotid bruit.  Pulmonary: Effort and breath sounds normal, no stridor, rhonchi, wheezes, rales.  Abdominal: Soft. BS +,  no distension, tenderness, rebound or guarding.  Musculoskeletal: Normal range of  motion. 1+ LEE with tawny skin color changes  no tenderness.  Neuro: Alert. CN 2-12 grossly intact. No focal deficits. Skin: Skin is warm and dry. No rash noted. Psychiatric: Normal mood and affect.      LABORATORY PANEL:   CBC Recent Labs  Lab 02/05/19 0925  WBC 12.8*  HGB 16.1  HCT 48.5  PLT 224   ------------------------------------------------------------------------------------------------------------------  Chemistries  Recent Labs  Lab 02/05/19 0207  NA 138  K 4.4  CL 98  CO2 29  GLUCOSE 229*  BUN 22  CREATININE 0.90  CALCIUM 9.1   ------------------------------------------------------------------------------------------------------------------  Cardiac Enzymes Recent Labs  Lab 02/04/19 1123  TROPONINI <0.03   ------------------------------------------------------------------------------------------------------------------  RADIOLOGY:  Dg Chest 2 View  Result Date: 02/04/2019 CLINICAL DATA:  Shortness of breath EXAM: CHEST - 2 VIEW COMPARISON:  July 27, 2009 FINDINGS: There is an irregular somewhat nodular appearing opacity in the left mid lung measuring 1.7 x 1.5 cm. There is mild scarring in this area. Elsewhere lungs appear clear. Heart size and pulmonary vascularity are normal. No adenopathy. There is aortic atherosclerosis. There is mild degenerative change in the lower thoracic region. IMPRESSION: Irregular somewhat nodular opacity measuring 1.7 x 1.5 cm in the left mid lung with nearby scarring. This area warrants correlation with chest CT, ideally with intravenous contrast, to further evaluate given concern for possible neoplasm in this area. Lungs elsewhere clear. No adenopathy evident. Heart size within normal limits. Aortic Atherosclerosis (ICD10-I70.0). These results will be called to the ordering clinician or representative by the Radiologist Assistant, and communication documented in the PACS or zVision Dashboard. Electronically Signed   By:  Lowella Grip III M.D.   On: 02/04/2019 11:05   Ct  Chest W Contrast  Result Date: 02/04/2019 CLINICAL DATA:  Dyspnea for 2 months. COPD. Abnormal chest radiograph. EXAM: CT CHEST WITH CONTRAST TECHNIQUE: Multidetector CT imaging of the chest was performed during intravenous contrast administration. CONTRAST:  15mL OMNIPAQUE IOHEXOL 300 MG/ML  SOLN COMPARISON:  02/04/2019 chest radiograph. FINDINGS: Cardiovascular: Normal heart size. No significant pericardial effusion/thickening. Three-vessel coronary atherosclerosis. Atherosclerotic nonaneurysmal thoracic aorta. Top-normal caliber main pulmonary artery (3.1 cm diameter). There is a questionable central segmental filling defect in a left upper lobe pulmonary artery branch (series 6/image 53). Mediastinum/Nodes: No discrete thyroid nodules. Unremarkable esophagus. No pathologically enlarged axillary, mediastinal or hilar lymph nodes. Lungs/Pleura: No pneumothorax. No pleural effusion. Mild centrilobular and paraseptal emphysema with diffuse bronchial wall thickening. There are bandlike subpleural foci of consolidation in medial right upper lobe (series 7/image 33) and posterior left upper lobe (series 7/image 69), compatible with evolving postinfectious/postinflammatory scarring and/or atelectasis. No lung masses or significant pulmonary nodules. No central airway stenoses. Upper abdomen: There are multiple moderately enlarged upper retroperitoneal nodes in the gastrohepatic ligament, left para-aortic and aortocaval chains, partially visualized. For example a 2.4 cm gastrohepasic ligament node (series 6/image 145) and a 2.0 cm left para-aortic node (series 6/image 160). These nodes are newly enlarged since 02/11/2012 CT abdomen. Musculoskeletal: No aggressive appearing focal osseous lesions. Moderate thoracic spondylosis. IMPRESSION: 1. Questionable central segmental filling defect within a left upper lobe pulmonary artery branch, not optimally evaluated on  this routine contrast-enhanced chest CT study, cannot exclude acute segmental pulmonary embolism. Dedicated PE protocol chest CT angiogram with IV contrast may be obtained. 2. Moderate upper retroperitoneal adenopathy, partially visualized, new since 2013 CT abdomen study, cannot exclude metastatic disease or lymphoma. Dedicated CT abdomen/pelvis with oral and IV contrast recommended for further evaluation. 3. Bandlike subpleural foci of consolidation in the medial right upper lobe and posterior left upper lobe, compatible with evolving postinfectious/postinflammatory scarring and/or atelectasis. 4. Mild centrilobular and paraseptal emphysema with diffuse bronchial wall thickening, compatible with the reported history of COPD. 5. Three-vessel coronary atherosclerosis. Aortic Atherosclerosis (ICD10-I70.0) and Emphysema (ICD10-J43.9). Critical Value/emergent results were called by telephone at the time of interpretation on 02/04/2019 at 12:59 pm to Dr. Arta Silence , who verbally acknowledged these results. Electronically Signed   By: Ilona Sorrel M.D.   On: 02/04/2019 13:03     ASSESSMENT AND PLAN:   73 year old male with history of COPD and diabetes who presents to the emergency room due to shortness of breath.  1.  Acute hypoxic respiratory failure with concern of pulmonary emboli and mild COPD exacerbation follow-up on CTA this morning Wean oxygen as tolerated Order echocardiogram to rule out underlying congestive heart failure No evidence of pneumonia clinically ISS at bedside Continue heparin for now  2.  Acute on chronic COPD exacerbation: Wean steroids Doxycycline for acute bronchitis associated with COPD Continue nebs and inhalers  3.  Abnormal CT chest concerning for possible malignancy: CT of the abdomen pelvis ordered as per recommendations by oncology Follow-up with Dr. Janese Banks  4.  Diabetes: Continue sliding scale   5.  Essential hypertension: Continue Cozaar and HCTZ 6.   Lower extremity edema: Echocardiogram ordered    Management plans discussed with the patient and he is in agreement.  CODE STATUS: full  TOTAL TIME TAKING CARE OF THIS PATIENT: 30 minutes.     POSSIBLE D/C 1-2 days, DEPENDING ON CLINICAL CONDITION.   Bettey Costa M.D on 02/05/2019 at 10:49 AM  Between 7am to 6pm - Pager - [phone] After  6pm go to [email or web address] - password [email or web address] [facility]  Office  [phone]  CC: Primary care physician; [name], MD  Note: This dictation was prepared with Dragon dictation along with smaller phrase technology. Any transcriptional errors that result from this process are unintentional.

## 2019-02-05 NOTE — Consult Note (Signed)
[place] for Heparin Indication: pulmonary embolus  Allergies  Allergen Reactions  . Aspirin Nausea Only and Other (See Comments)    325 mg aspirin cause stomach upset  . Penicillins Rash    Patient Measurements: Height: 5\' 6"  (167.6 cm) Weight: 219 lb (99.3 kg) IBW/kg (Calculated) : 63.8 Heparin Dosing Weight: 97kg  Vital Signs: Temp: 97.5 F (36.4 C) (06/11 0827) Temp Source: Oral (06/11 0827) BP: 148/87 (06/11 0827) Pulse Rate: 82 (06/11 0827)  Labs: Recent Labs    02/04/19 1123 02/04/19 1630 02/05/19 0207 02/05/19 0925  HGB 14.9  --   --  16.1  HCT 45.4  --   --  48.5  PLT 218  --   --  224  APTT  --  32  --   --   LABPROT  --  12.7  --   --   INR  --  1.0  --   --   HEPARINUNFRC  --   --  0.83* 0.53  CREATININE 0.74  --  0.90  --   TROPONINI <0.03  --   --   --     Estimated Creatinine Clearance: 79.4 mL/min (by C-G formula based on SCr of 0.9 mg/dL).   Medications:  Medications Prior to Admission  Medication Sig Dispense Refill Last Dose  . albuterol (VENTOLIN HFA) 108 (90 Base) MCG/ACT inhaler Inhale 2 puffs into the lungs as needed for wheezing or shortness of breath.   02/04/2019 at Unknown time  . aspirin 81 MG EC tablet Take 81 mg by mouth daily. Swallow whole.   02/04/2019 at Unknown time  . atorvastatin (LIPITOR) 40 MG tablet Take 40 mg by mouth every evening.   02/03/2019 at Unknown time  . B Complex-C (SUPER B COMPLEX PO) Take 1 tablet by mouth daily.   02/04/2019 at Unknown time  . budesonide-formoterol (SYMBICORT) 160-4.5 MCG/ACT inhaler Inhale 2 puffs into the lungs 2 (two) times daily.   02/04/2019 at Unknown time  . glipiZIDE (GLUCOTROL) 5 MG tablet Take 5 mg by mouth 2 (two) times daily before a meal.   02/04/2019 at Unknown time  . losartan-hydrochlorothiazide (HYZAAR) 100-25 MG tablet Take 1 tablet by mouth daily.   02/04/2019 at Unknown time  . metFORMIN (GLUCOPHAGE) 500 MG tablet Take by mouth 2 (two)  times daily with a meal.   02/04/2019 at Unknown time  . Omega-3 Fatty Acids (FISH OIL PO) Take 2 capsules by mouth 2 (two) times daily.   02/03/2019 at Unknown time  . traZODone (DESYREL) 50 MG tablet Take 100 mg by mouth at bedtime.   02/03/2019 at Unknown time  . vitamin C (ASCORBIC ACID) 500 MG tablet Take 500 mg by mouth daily.   02/04/2019 at Unknown time  . cyclobenzaprine (FLEXERIL) 10 MG tablet Take 10 mg by mouth at bedtime.   prn at prn  . fluticasone (FLONASE) 50 MCG/ACT nasal spray Place 1 spray into both nostrils 2 (two) times a day.   prn at prn  . gabapentin (NEURONTIN) 100 MG capsule Take 200 mg by mouth 3 (three) times daily.     Marland Kitchen gabapentin (NEURONTIN) 400 MG capsule Take 1 capsule (400 mg total) by mouth 2 (two) times daily. (Patient not taking: Reported on 02/04/2019) 60 capsule 3 Not Taking at Unknown time  . HYDROcodone-acetaminophen (NORCO) 7.5-325 MG tablet Take 1 tablet by mouth every 6 (six) hours as needed for moderate pain. (Patient not taking: Reported on 02/04/2019) 50  tablet 0 Not Taking at Unknown time   Scheduled:  . aspirin EC  81 mg Oral Daily  . gabapentin  400 mg Oral BID  . glipiZIDE  5 mg Oral BID AC  . guaiFENesin  600 mg Oral BID  . losartan  100 mg Oral Daily   And  . hydrochlorothiazide  25 mg Oral Daily  . insulin aspart  0-20 Units Subcutaneous TID WC  . insulin aspart  0-5 Units Subcutaneous QHS  . iohexol  25 mL Oral Q1 Hr x 2  . ipratropium-albuterol  3 mL Nebulization Q6H  . methylPREDNISolone (SOLU-MEDROL) injection  60 mg Intravenous Q6H  . mometasone-formoterol  2 puff Inhalation BID  . sodium chloride flush  10-40 mL Intracatheter Q12H  . sodium chloride flush  3 mL Intravenous Q12H   Infusions:  . sodium chloride    . heparin 1,400 Units/hr (02/05/19 0834)   PRN:  Anti-infectives (From admission, onward)   None      Assessment: Pharmacy consulted for heparin for PE. Per pt no DOAC PTA. CBC stable.  6/10 Heparin infusion started @  1600 units/hr  6/11 0207 HL 0.83 Rate decreased to 1400 units/hr 6/11 0925 HL 0.53   Goal of Therapy:  Heparin level 0.3-0.7 units/ml Monitor platelets by anticoagulation protocol: Yes   Plan:  6/11 @ 0925 HL: 0.53. Level is therapeutic x 1  Will continue heparin rate of 1400 units/hr  Check anti-Xa level in 8 hours and daily while on heparin Continue to monitor H&H and platelets CBC's daily per protocol while on heparin drip  Lu Duffel, PharmD, BCPS Clinical Pharmacist 02/05/2019 10:33 AM

## 2019-02-05 NOTE — Progress Notes (Signed)
*  PRELIMINARY RESULTS* Echocardiogram 2D Echocardiogram has been performed.  Julian Sparks 02/05/2019, 1:50 PM

## 2019-02-06 ENCOUNTER — Encounter: Payer: Self-pay | Admitting: Internal Medicine

## 2019-02-06 ENCOUNTER — Other Ambulatory Visit: Payer: Self-pay | Admitting: Oncology

## 2019-02-06 DIAGNOSIS — R591 Generalized enlarged lymph nodes: Secondary | ICD-10-CM

## 2019-02-06 DIAGNOSIS — R599 Enlarged lymph nodes, unspecified: Secondary | ICD-10-CM

## 2019-02-06 LAB — BASIC METABOLIC PANEL
Anion gap: 7 (ref 5–15)
BUN: 31 mg/dL — ABNORMAL HIGH (ref 8–23)
CO2: 28 mmol/L (ref 22–32)
Calcium: 8.6 mg/dL — ABNORMAL LOW (ref 8.9–10.3)
Chloride: 100 mmol/L (ref 98–111)
Creatinine, Ser: 0.89 mg/dL (ref 0.61–1.24)
GFR calc Af Amer: >60 mL/min (ref >60)
GFR calc non Af Amer: >60 mL/min (ref >60)
Glucose, Bld: 201 mg/dL — ABNORMAL HIGH (ref 70–99)
Potassium: 3.9 mmol/L (ref 3.5–5.1)
Sodium: 135 mmol/L (ref 135–145)

## 2019-02-06 LAB — GLUCOSE, CAPILLARY
Glucose-Capillary: 170 mg/dL — ABNORMAL HIGH (ref 70–99)
Glucose-Capillary: 228 mg/dL — ABNORMAL HIGH (ref 70–99)

## 2019-02-06 MED ORDER — PREDNISONE 50 MG PO TABS: 50.0000 mg | ORAL_TABLET | Freq: Every day | ORAL | 0 refills | Status: AC

## 2019-02-06 MED ORDER — BIOTENE DRY MOUTH MT LIQD: 15.0000 mL | OROMUCOSAL | Status: DC | PRN

## 2019-02-06 MED ORDER — DOXYCYCLINE HYCLATE 100 MG PO TABS: 100.0000 mg | ORAL_TABLET | Freq: Two times a day (BID) | ORAL | 0 refills | Status: AC

## 2019-02-06 NOTE — Plan of Care (Signed)
SATURATION QUALIFICATIONS: (This note is used to comply with regulatory documentation for home oxygen)  Patient Saturations on Room Air at Rest =93%  Patient Saturations on Room Air while Ambulating = 86%  Patient Saturations on 2Liters of oxygen while Ambulating =95%  Please briefly explain why patient needs home oxygen: Pt desats with exertion

## 2019-02-06 NOTE — Progress Notes (Signed)
Pt is d/ced home. He qualifies for home O2.  Will review d/c paperwork, new meds and f/u appts.  IVs removed.  New diagnosis of CHF.  Will f/u w/cardiology outpt.  Will educate to watch weight gain and alert dr. Also f/u outpt with oncology for abnormal chest CT.

## 2019-02-06 NOTE — Discharge Summary (Signed)
[facility] NAME: Julian Sparks    MR#:  [id number]  DATE OF BIRTH:  22-Oct-1945  DATE OF ADMISSION:  02/04/2019 ADMITTING PHYSICIAN: Gorden Harms, MD  DATE OF DISCHARGE: 02/06/2019  PRIMARY CARE PHYSICIAN: [name], MD    ADMISSION DIAGNOSIS:  Acute on chronic respiratory failure with hypoxia (HCC) [J96.21] Acute pulmonary embolism, unspecified pulmonary embolism type, unspecified whether acute cor pulmonale present ([place]) [I26.99]  DISCHARGE DIAGNOSIS:  Active Problems:   Respiratory failure ([place])   SECONDARY DIAGNOSIS:   Past Medical History:  Diagnosis Date  . Chronic bronchitis ([place])   . Diabetes mellitus without complication ([place])   . Diastolic dysfunction   . Dyspnea    with exertion  . Hypertension     HOSPITAL COURSE:  73 year old male with history of COPD and diabetes who presents to the emergency room due to shortness of breath.  1.  Acute hypoxic respiratory failure due to mild COPD exacerbation and new onset acute diastolic dysfunction: Patient symptoms have improved. CT chest negative for pulmonary emboli. 2.  Acute on chronic COPD exacerbation: Patient will be discharged on oral doxycycline for acute bronchitis related to COPD exacerbation as well as prednisone.  He will continue inhalers.  3.  Abnormal CT chest concerning for possible malignancy: Patient will need a PET CT scan as an outpatient to evaluate this lymph nodes  followed by a CT-guided retroperitoneal lymph node biopsy which will need to be done after the PET CT scan.   Overall findings are concerning for lymphoma since there is no other primary malignancy noted. Patient will follow-up with Dr. Janese Banks.  4.  Diabetes: He will continue outpatient regimen with ADA diet 5.  Essential hypertension: Continue Cozaar and HCTZ 6.    Acute mild diastolic heart failure: Echocardiogram shows normal ejection fraction however does show evidence of LVH.   Patient has mild diastolic dysfunction.  He is referred to CHF clinic upon discharge.  His primary care physician is Dr. [name] who is also a cardiologist and he will follow-up with him.  At this time patient will not require Lasix at discharge.  He did receive 20 mg of IV Lasix for his lower extremity edema which improved.  DISCHARGE CONDITIONS AND DIET:   Stable for discharge on heart healthy diet and diabetic diet  CONSULTS OBTAINED:  Treatment Team:  Everitt Amber, MD  DRUG ALLERGIES:   Allergies  Allergen Reactions  . Aspirin Nausea Only and Other (See Comments)    325 mg aspirin cause stomach upset  . Penicillins Rash    DISCHARGE MEDICATIONS:   Allergies as of 02/06/2019      Reactions   Aspirin Nausea Only, Other (See Comments)   325 mg aspirin cause stomach upset   Penicillins Rash      Medication List    STOP taking these medications   HYDROcodone-acetaminophen 7.5-325 MG tablet Commonly known as: Norco     TAKE these medications   aspirin 81 MG EC tablet Take 81 mg by mouth daily. Swallow whole.   atorvastatin 40 MG tablet Commonly known as: LIPITOR Take 40 mg by mouth every evening.   budesonide-formoterol 160-4.5 MCG/ACT inhaler Commonly known as: SYMBICORT Inhale 2 puffs into the lungs 2 (two) times daily.   cyclobenzaprine 10 MG tablet Commonly known as: FLEXERIL Take 10 mg by mouth at bedtime.   doxycycline 100 MG tablet Commonly known as: VIBRA-TABS Take 1 tablet (100 mg total) by mouth every  12 (twelve) hours for 3 days.   FISH OIL PO Take 2 capsules by mouth 2 (two) times daily.   fluticasone 50 MCG/ACT nasal spray Commonly known as: FLONASE Place 1 spray into both nostrils 2 (two) times a day.   gabapentin 100 MG capsule Commonly known as: NEURONTIN Take 200 mg by mouth 3 (three) times daily. What changed: Another medication with the same name was removed. Continue taking this medication, and follow the directions you see here.    glipiZIDE 5 MG tablet Commonly known as: GLUCOTROL Take 5 mg by mouth 2 (two) times daily before a meal.   losartan-hydrochlorothiazide 100-25 MG tablet Commonly known as: HYZAAR Take 1 tablet by mouth daily.   metFORMIN 500 MG tablet Commonly known as: GLUCOPHAGE Take by mouth 2 (two) times daily with a meal.   predniSONE 50 MG tablet Commonly known as: DELTASONE Take 1 tablet (50 mg total) by mouth daily with breakfast for 4 days.   SUPER B COMPLEX PO Take 1 tablet by mouth daily.   traZODone 50 MG tablet Commonly known as: DESYREL Take 100 mg by mouth at bedtime.   Ventolin HFA 108 (90 Base) MCG/ACT inhaler Generic drug: albuterol Inhale 2 puffs into the lungs as needed for wheezing or shortness of breath.   vitamin C 500 MG tablet Commonly known as: ASCORBIC ACID Take 500 mg by mouth daily.            Durable Medical Equipment  (From admission, onward)         Start     Ordered   02/06/19 0000  For home use only DME oxygen    Question Answer Comment  Length of Need Lifetime   Mode or (Route) Nasal cannula   Liters per Minute 2   Frequency Continuous (stationary and portable oxygen unit needed)   Oxygen conserving device Yes   Oxygen delivery system Gas      02/06/19 0812            Today   CHIEF COMPLAINT:   patient doing well this morning.  He is ready to go home today.  Denies shortness of breath or wheezing.   VITAL SIGNS:  Blood pressure (!) 171/73, pulse 97, temperature 97.9 F (36.6 C), temperature source Oral, resp. rate 16, height 5\' 6"  (1.676 m), weight 99.3 kg, SpO2 93 %.   REVIEW OF SYSTEMS:  Review of Systems  Constitutional: Negative.  Negative for chills, fever and malaise/fatigue.  HENT: Negative.  Negative for ear discharge, ear pain, hearing loss, nosebleeds and sore throat.   Eyes: Negative.  Negative for blurred vision and pain.  Respiratory: Negative.  Negative for cough, hemoptysis, shortness of breath and  wheezing.   Cardiovascular: Negative.  Negative for chest pain, palpitations and leg swelling.  Gastrointestinal: Negative.  Negative for abdominal pain, blood in stool, diarrhea, nausea and vomiting.  Genitourinary: Negative.  Negative for dysuria.  Musculoskeletal: Negative.  Negative for back pain.  Skin: Negative.   Neurological: Negative for dizziness, tremors, speech change, focal weakness, seizures and headaches.  Endo/Heme/Allergies: Negative.  Does not bruise/bleed easily.  Psychiatric/Behavioral: Negative.  Negative for depression, hallucinations and suicidal ideas.     PHYSICAL EXAMINATION:  GENERAL:  73 y.o.-year-old patient lying in the bed with no acute distress.  NECK:  Supple, no jugular venous distention. No thyroid enlargement, no tenderness.  LUNGS: Normal breath sounds bilaterally, no wheezing, rales,rhonchi  No use of accessory muscles of respiration.  CARDIOVASCULAR: S1, S2 normal. No  murmurs, rubs, or gallops.  ABDOMEN: Soft, non-tender, non-distended. Bowel sounds present. No organomegaly or mass.  EXTREMITIES: No pedal edema, cyanosis, or clubbing.  PSYCHIATRIC: The patient is alert and oriented x 3.  SKIN: No obvious rash, lesion, or ulcer.   DATA REVIEW:   CBC Recent Labs  Lab 02/05/19 0925  WBC 12.8*  HGB 16.1  HCT 48.5  PLT 224    Chemistries  Recent Labs  Lab 02/06/19 0442  NA 135  K 3.9  CL 100  CO2 28  GLUCOSE 201*  BUN 31*  CREATININE 0.89  CALCIUM 8.6*    Cardiac Enzymes Recent Labs  Lab 02/04/19 1123  [place] <0.03    Microbiology Results  @[email or web address] @  RADIOLOGY:  Dg Chest 2 View  Result Date: 02/04/2019 CLINICAL DATA:  Shortness of breath EXAM: CHEST - 2 VIEW COMPARISON:  July 27, 2009 FINDINGS: There is an irregular somewhat nodular appearing opacity in the left mid lung measuring 1.7 x 1.5 cm. There is mild scarring in this area. Elsewhere lungs appear clear. Heart size and pulmonary vascularity are normal. No  adenopathy. There is aortic atherosclerosis. There is mild degenerative change in the lower thoracic region. IMPRESSION: Irregular somewhat nodular opacity measuring 1.7 x 1.5 cm in the left mid lung with nearby scarring. This area warrants correlation with chest CT, ideally with intravenous contrast, to further evaluate given concern for possible neoplasm in this area. Lungs elsewhere clear. No adenopathy evident. Heart size within normal limits. Aortic Atherosclerosis (ICD10-I70.0). These results will be called to the ordering clinician or representative by the Radiologist Assistant, and communication documented in the PACS or zVision Dashboard. Electronically Signed   By: Lowella Grip III M.D.   On: 02/04/2019 11:05   Ct Chest W Contrast  Result Date: 02/04/2019 CLINICAL DATA:  Dyspnea for 2 months. COPD. Abnormal chest radiograph. EXAM: CT CHEST WITH CONTRAST TECHNIQUE: Multidetector CT imaging of the chest was performed during intravenous contrast administration. CONTRAST:  47mL OMNIPAQUE IOHEXOL 300 MG/ML  SOLN COMPARISON:  02/04/2019 chest radiograph. FINDINGS: Cardiovascular: Normal heart size. No significant pericardial effusion/thickening. Three-vessel coronary atherosclerosis. Atherosclerotic nonaneurysmal thoracic aorta. Top-normal caliber main pulmonary artery (3.1 cm diameter). There is a questionable central segmental filling defect in a left upper lobe pulmonary artery branch (series 6/image 53). Mediastinum/Nodes: No discrete thyroid nodules. Unremarkable esophagus. No pathologically enlarged axillary, mediastinal or hilar lymph nodes. Lungs/Pleura: No pneumothorax. No pleural effusion. Mild centrilobular and paraseptal emphysema with diffuse bronchial wall thickening. There are bandlike subpleural foci of consolidation in medial right upper lobe (series 7/image 33) and posterior left upper lobe (series 7/image 69), compatible with evolving postinfectious/postinflammatory scarring and/or  atelectasis. No lung masses or significant pulmonary nodules. No central airway stenoses. Upper abdomen: There are multiple moderately enlarged upper retroperitoneal nodes in the gastrohepatic ligament, left para-aortic and aortocaval chains, partially visualized. For example a 2.4 cm gastrohepasic ligament node (series 6/image 145) and a 2.0 cm left para-aortic node (series 6/image 160). These nodes are newly enlarged since 02/11/2012 CT abdomen. Musculoskeletal: No aggressive appearing focal osseous lesions. Moderate thoracic spondylosis. IMPRESSION: 1. Questionable central segmental filling defect within a left upper lobe pulmonary artery branch, not optimally evaluated on this routine contrast-enhanced chest CT study, cannot exclude acute segmental pulmonary embolism. Dedicated PE protocol chest CT angiogram with IV contrast may be obtained. 2. Moderate upper retroperitoneal adenopathy, partially visualized, new since 2013 CT abdomen study, cannot exclude metastatic disease or lymphoma. Dedicated CT abdomen/pelvis with oral and IV contrast recommended for  further evaluation. 3. Bandlike subpleural foci of consolidation in the medial right upper lobe and posterior left upper lobe, compatible with evolving postinfectious/postinflammatory scarring and/or atelectasis. 4. Mild centrilobular and paraseptal emphysema with diffuse bronchial wall thickening, compatible with the reported history of COPD. 5. Three-vessel coronary atherosclerosis. Aortic Atherosclerosis (ICD10-I70.0) and Emphysema (ICD10-J43.9). Critical Value/emergent results were called by telephone at the time of interpretation on 02/04/2019 at 12:59 pm to Dr. Arta Silence , who verbally acknowledged these results. Electronically Signed   By: Ilona Sorrel M.D.   On: 02/04/2019 13:03   Ct Angio Chest Pe W Or Wo Contrast  Result Date: 02/05/2019 CLINICAL DATA:  Possible pulmonary embolism suspected on CT scan from yesterday. EXAM: CT ANGIOGRAPHY  CHEST WITH CONTRAST TECHNIQUE: Multidetector CT imaging of the chest was performed using the standard protocol during bolus administration of intravenous contrast. Multiplanar CT image reconstructions and MIPs were obtained to evaluate the vascular anatomy. CONTRAST:  24mL OMNIPAQUE IOHEXOL 350 MG/ML SOLN COMPARISON:  Chest CT 02/04/2019 FINDINGS: Cardiovascular: The heart is normal in size. No pericardial effusion. There is mild tortuosity and calcification of the thoracic aorta but no aneurysm or dissection. The branch vessels are patent. Stable scattered coronary artery calcifications. The pulmonary arterial tree is well opacified. No filling defects to suggest pulmonary embolism. Mediastinum/Nodes: No mediastinal or hilar mass or lymphadenopathy. Small scattered lymph nodes are stable. The esophagus is grossly normal. Lungs/Pleura: Emphysematous changes and pulmonary scarring along with patchy areas of atelectasis. No focal infiltrates or worrisome pulmonary lesions. No pleural effusions. Upper Abdomen: Upper abdominal lymphadenopathy is again demonstrated. Musculoskeletal: No chest wall mass, supraclavicular or axillary adenopathy. The bony thorax is intact. No worrisome bone lesions. Review of the MIP images confirms the above findings. IMPRESSION: 1. No CT findings for pulmonary embolism. 2. Emphysematous changes and pulmonary scarring along with patchy areas of atelectasis but no worrisome pulmonary lesions. 3. No mediastinal or hilar mass or adenopathy. 4. Upper abdominal lymphadenopathy. Aortic Atherosclerosis (ICD10-I70.0) and Emphysema (ICD10-J43.9). Electronically Signed   By: Marijo Sanes M.D.   On: 02/05/2019 11:39   Ct Abdomen Pelvis W Contrast  Result Date: 02/05/2019 CLINICAL DATA:  Adenopathy noted on prior chest CT. EXAM: CT ABDOMEN AND PELVIS WITH CONTRAST TECHNIQUE: Multidetector CT imaging of the abdomen and pelvis was performed using the standard protocol following bolus administration  of intravenous contrast. CONTRAST:  62mL OMNIPAQUE IOHEXOL 350 MG/ML SOLN COMPARISON:  Abdominal CT scan 02/11/2012. FINDINGS: Lower chest: The lung bases are clear of an acute process. The heart is normal in size. The distal esophagus is grossly normal. Hepatobiliary: No focal hepatic lesions or intrahepatic biliary dilatation. The gallbladder appears normal. There is contrast in the gallbladder secondary to vicarious excretion from yesterday CT scan. No common bile duct dilatation. Pancreas: Prominent fatty interstices but no mass, inflammation or ductal dilatation. Spleen: Normal size.  No focal lesions. Adrenals/Urinary Tract: The adrenal glands and kidneys are unremarkable. The bladder is normal. Stomach/Bowel: The stomach, duodenum, small bowel and colon are unremarkable. No acute inflammatory changes, mass lesions or obstructive findings. The terminal ileum and appendix are normal. Diffuse colonic diverticulosis and moderate stool throughout the colon but no findings for acute diverticulitis. Vascular/Lymphatic: 2.2 cm gastrohepatic ligament node on image 20. 2 cm celiac axis lymph node on image number 20. Bulk E retroperitoneal lymph node with 2.9 cm right-sided retroperitoneal lymph node on image number 37. 1.6 cm inter aortocaval lymph node on image number 34. 1.9 cm left-sided retroperitoneal lymph node on image  number 27. Small scattered mesenteric lymph nodes are also noted. No pelvic or inguinal lymphadenopathy. Reproductive: The prostate gland and seminal vesicles are unremarkable. There is mild median lobe hypertrophy of the prostate gland. Other: No inguinal mass or adenopathy. Small inguinal hernias containing fat. Small periumbilical abdominal wall hernia containing fat. Musculoskeletal: No significant bony findings. IMPRESSION: 1. Abdominal lymphadenopathy as detailed above. Findings most suspicious for lymphoproliferative process such as lymphoma. I do not see any easily biopsied axillary or  inguinal lymph nodes. 2. No findings for a primary abdominal/pelvic neoplasm to suggest this is metastatic adenopathy. Electronically Signed   By: Marijo Sanes M.D.   On: 02/05/2019 11:47      Allergies as of 02/06/2019      Reactions   Aspirin Nausea Only, Other (See Comments)   325 mg aspirin cause stomach upset   Penicillins Rash      Medication List    STOP taking these medications   HYDROcodone-acetaminophen 7.5-325 MG tablet Commonly known as: Norco     TAKE these medications   aspirin 81 MG EC tablet Take 81 mg by mouth daily. Swallow whole.   atorvastatin 40 MG tablet Commonly known as: LIPITOR Take 40 mg by mouth every evening.   budesonide-formoterol 160-4.5 MCG/ACT inhaler Commonly known as: SYMBICORT Inhale 2 puffs into the lungs 2 (two) times daily.   cyclobenzaprine 10 MG tablet Commonly known as: FLEXERIL Take 10 mg by mouth at bedtime.   doxycycline 100 MG tablet Commonly known as: VIBRA-TABS Take 1 tablet (100 mg total) by mouth every 12 (twelve) hours for 3 days.   FISH OIL PO Take 2 capsules by mouth 2 (two) times daily.   fluticasone 50 MCG/ACT nasal spray Commonly known as: FLONASE Place 1 spray into both nostrils 2 (two) times a day.   gabapentin 100 MG capsule Commonly known as: NEURONTIN Take 200 mg by mouth 3 (three) times daily. What changed: Another medication with the same name was removed. Continue taking this medication, and follow the directions you see here.   glipiZIDE 5 MG tablet Commonly known as: GLUCOTROL Take 5 mg by mouth 2 (two) times daily before a meal.   losartan-hydrochlorothiazide 100-25 MG tablet Commonly known as: HYZAAR Take 1 tablet by mouth daily.   metFORMIN 500 MG tablet Commonly known as: GLUCOPHAGE Take by mouth 2 (two) times daily with a meal.   predniSONE 50 MG tablet Commonly known as: DELTASONE Take 1 tablet (50 mg total) by mouth daily with breakfast for 4 days.   SUPER B COMPLEX PO Take 1  tablet by mouth daily.   traZODone 50 MG tablet Commonly known as: DESYREL Take 100 mg by mouth at bedtime.   Ventolin HFA 108 (90 Base) MCG/ACT inhaler Generic drug: albuterol Inhale 2 puffs into the lungs as needed for wheezing or shortness of breath.   vitamin C 500 MG tablet Commonly known as: ASCORBIC ACID Take 500 mg by mouth daily.            Durable Medical Equipment  (From admission, onward)         Start     Ordered   02/06/19 0000  For home use only DME oxygen    Question Answer Comment  Length of Need Lifetime   Mode or (Route) Nasal cannula   Liters per Minute 2   Frequency Continuous (stationary and portable oxygen unit needed)   Oxygen conserving device Yes   Oxygen delivery system Gas  02/06/19 5747            Management plans discussed with the patient and he is in agreement. Stable for discharge home with [facility]  Patient should follow up with pcp  CODE STATUS:     Code Status Orders  (From admission, onward)         Start     Ordered   02/04/19 1630  Full code  Continuous     02/04/19 1629        Code Status History    This patient has a current code status but no historical code status.   Advance Care Planning Activity      TOTAL TIME TAKING CARE OF THIS PATIENT: 38 minutes.    Note: This dictation was prepared with Dragon dictation along with smaller phrase technology. Any transcriptional errors that result from this process are unintentional.  Bettey Costa M.D on 02/06/2019 at 9:14 AM  Between 7am to 6pm - Pager - [phone] After 6pm go to [email or web address] - password [email or web address] [facility]  Office  [phone]  CC: Primary care physician; [name], MD

## 2019-02-06 NOTE — Care Management Important Message (Signed)
Important Message  Patient Details  Name: Julian Sparks MRN: [id number] Date of Birth: Jun 18, 1946   Medicare Important Message Given:  Yes    [name] 02/06/2019, 10:43 AM

## 2019-02-06 NOTE — TOC Transition Note (Signed)
Transition of Care [facility]) - CM/SW Discharge Note   Patient Details  Name: Julian Sparks MRN: [id number] Date of Birth: 10/14/1945  Transition of Care [facility]) CM/SW Contact:  Annamaria Boots, [place] Phone Number: 02/06/2019, 10:44 AM   Clinical Narrative: Patient is medically ready for discharge today. He will need home health PT, RN and Aide. CSW spoke with patient and he is agreeable to home health with [facility]. CSW notified [place] with [facility] of referral. Patient also needs home oxygen. CSW notified Brad with [employer] of O2 needs. Patient reports that he lives alone and his daughter will transport him home.       Final next level of care: [place] Barriers to Discharge: No Barriers Identified   Patient Goals and CMS Choice Patient states their goals for this hospitalization and ongoing recovery are:: Return home CMS Medicare.gov Compare Post Acute Care list provided to:: Patient Choice offered to / list presented to : Patient  Discharge Placement                    Patient and family notified of of transfer: 02/06/19  Discharge Plan and Services                          HH Arranged: RN, PT, Nurse's Aide [place] Agency: [facility] Date [place]: 02/06/19 Time [place]: 3818 Representative spoke with at [place]: Eugenio Saenz ([place]) Interventions     Readmission Risk Interventions No flowsheet data found.

## 2019-02-07 DIAGNOSIS — I110 Hypertensive heart disease with heart failure: Secondary | ICD-10-CM | POA: Diagnosis not present

## 2019-02-07 DIAGNOSIS — Z7952 Long term (current) use of systemic steroids: Secondary | ICD-10-CM | POA: Diagnosis not present

## 2019-02-07 DIAGNOSIS — Z7984 Long term (current) use of oral hypoglycemic drugs: Secondary | ICD-10-CM | POA: Diagnosis not present

## 2019-02-07 DIAGNOSIS — Z7951 Long term (current) use of inhaled steroids: Secondary | ICD-10-CM | POA: Diagnosis not present

## 2019-02-07 DIAGNOSIS — I2699 Other pulmonary embolism without acute cor pulmonale: Secondary | ICD-10-CM | POA: Diagnosis not present

## 2019-02-07 DIAGNOSIS — E119 Type 2 diabetes mellitus without complications: Secondary | ICD-10-CM | POA: Diagnosis not present

## 2019-02-07 DIAGNOSIS — J9611 Chronic respiratory failure with hypoxia: Secondary | ICD-10-CM | POA: Diagnosis not present

## 2019-02-07 DIAGNOSIS — J441 Chronic obstructive pulmonary disease with (acute) exacerbation: Secondary | ICD-10-CM | POA: Diagnosis not present

## 2019-02-07 DIAGNOSIS — I509 Heart failure, unspecified: Secondary | ICD-10-CM | POA: Diagnosis not present

## 2019-02-08 ENCOUNTER — Other Ambulatory Visit: Payer: Self-pay | Admitting: *Deleted

## 2019-02-08 DIAGNOSIS — I2699 Other pulmonary embolism without acute cor pulmonale: Secondary | ICD-10-CM | POA: Diagnosis not present

## 2019-02-08 DIAGNOSIS — J9611 Chronic respiratory failure with hypoxia: Secondary | ICD-10-CM | POA: Diagnosis not present

## 2019-02-08 DIAGNOSIS — J441 Chronic obstructive pulmonary disease with (acute) exacerbation: Secondary | ICD-10-CM | POA: Diagnosis not present

## 2019-02-08 DIAGNOSIS — Z7984 Long term (current) use of oral hypoglycemic drugs: Secondary | ICD-10-CM | POA: Diagnosis not present

## 2019-02-08 DIAGNOSIS — Z7951 Long term (current) use of inhaled steroids: Secondary | ICD-10-CM | POA: Diagnosis not present

## 2019-02-08 DIAGNOSIS — R935 Abnormal findings on diagnostic imaging of other abdominal regions, including retroperitoneum: Secondary | ICD-10-CM

## 2019-02-08 DIAGNOSIS — Z7952 Long term (current) use of systemic steroids: Secondary | ICD-10-CM | POA: Diagnosis not present

## 2019-02-08 DIAGNOSIS — R599 Enlarged lymph nodes, unspecified: Secondary | ICD-10-CM

## 2019-02-08 DIAGNOSIS — R591 Generalized enlarged lymph nodes: Secondary | ICD-10-CM

## 2019-02-08 DIAGNOSIS — E119 Type 2 diabetes mellitus without complications: Secondary | ICD-10-CM | POA: Diagnosis not present

## 2019-02-08 DIAGNOSIS — I110 Hypertensive heart disease with heart failure: Secondary | ICD-10-CM | POA: Diagnosis not present

## 2019-02-08 DIAGNOSIS — I509 Heart failure, unspecified: Secondary | ICD-10-CM | POA: Diagnosis not present

## 2019-02-09 ENCOUNTER — Telehealth: Payer: Self-pay | Admitting: *Deleted

## 2019-02-09 NOTE — Telephone Encounter (Signed)
Called and spoke to patient and his daughter and went over pet scan- why is it being done. Gave them instructions to [facility].6/22 8:30 and come 15 min early. NPO after midnight and because he is diabetic he will need to eat low carb diet the night before at dinner time. He can have meat and veg. But stay away from all breads , rice, chips, crackers, potato and corn and peas are high carbs so don't wat them either. Explained that he will get sugar substance in IV and they cannot proceed with the test if sugar is over 200. Both agree to the above and his [facility] And gave instructions to that.

## 2019-02-09 NOTE — Telephone Encounter (Signed)
-----   Message from Leighton Parody sent at 02/09/2019  2:09 PM EDT ----- I called and Margreta Journey said the PET was approved. All pts have been made. ----- Message ----- From: Luella Cook, RN Sent: 02/08/2019  12:56 PM EDT To: Ronney Lion Dr. Janese Banks saw patient in hospital on Friday He has enlarged lymph nodes by CT He needs PET ASAp Make appt 2 weeks from 6/12 for f/u in clinic  If you send me a message when appts are done , I will call him and go over everything  Thanks [facility]   PS. You may want to call christina [phone] and jsut give her the heads up to get approval with [place] needing pet scan so quickly

## 2019-02-11 DIAGNOSIS — J209 Acute bronchitis, unspecified: Secondary | ICD-10-CM | POA: Diagnosis not present

## 2019-02-11 DIAGNOSIS — M67919 Unspecified disorder of synovium and tendon, unspecified shoulder: Secondary | ICD-10-CM | POA: Diagnosis not present

## 2019-02-11 DIAGNOSIS — J439 Emphysema, unspecified: Secondary | ICD-10-CM | POA: Diagnosis not present

## 2019-02-11 DIAGNOSIS — M5417 Radiculopathy, lumbosacral region: Secondary | ICD-10-CM | POA: Diagnosis not present

## 2019-02-12 DIAGNOSIS — J449 Chronic obstructive pulmonary disease, unspecified: Secondary | ICD-10-CM | POA: Diagnosis not present

## 2019-02-13 DIAGNOSIS — Z7984 Long term (current) use of oral hypoglycemic drugs: Secondary | ICD-10-CM | POA: Diagnosis not present

## 2019-02-13 DIAGNOSIS — J9611 Chronic respiratory failure with hypoxia: Secondary | ICD-10-CM | POA: Diagnosis not present

## 2019-02-13 DIAGNOSIS — J441 Chronic obstructive pulmonary disease with (acute) exacerbation: Secondary | ICD-10-CM | POA: Diagnosis not present

## 2019-02-13 DIAGNOSIS — Z7951 Long term (current) use of inhaled steroids: Secondary | ICD-10-CM | POA: Diagnosis not present

## 2019-02-13 DIAGNOSIS — I509 Heart failure, unspecified: Secondary | ICD-10-CM | POA: Diagnosis not present

## 2019-02-13 DIAGNOSIS — I110 Hypertensive heart disease with heart failure: Secondary | ICD-10-CM | POA: Diagnosis not present

## 2019-02-13 DIAGNOSIS — Z7952 Long term (current) use of systemic steroids: Secondary | ICD-10-CM | POA: Diagnosis not present

## 2019-02-13 DIAGNOSIS — I2699 Other pulmonary embolism without acute cor pulmonale: Secondary | ICD-10-CM | POA: Diagnosis not present

## 2019-02-13 DIAGNOSIS — E119 Type 2 diabetes mellitus without complications: Secondary | ICD-10-CM | POA: Diagnosis not present

## 2019-02-16 ENCOUNTER — Ambulatory Visit
Admission: RE | Admit: 2019-02-16 | Discharge: 2019-02-16 | Disposition: A | Discharge status: Home / Self Care | Payer: Medicare PPO | Source: Ambulatory Visit | Attending: Oncology | Admitting: Oncology

## 2019-02-16 ENCOUNTER — Other Ambulatory Visit: Payer: Self-pay

## 2019-02-16 DIAGNOSIS — R591 Generalized enlarged lymph nodes: Secondary | ICD-10-CM

## 2019-02-16 DIAGNOSIS — R935 Abnormal findings on diagnostic imaging of other abdominal regions, including retroperitoneum: Secondary | ICD-10-CM

## 2019-02-16 DIAGNOSIS — R599 Enlarged lymph nodes, unspecified: Secondary | ICD-10-CM

## 2019-02-17 DIAGNOSIS — Z7951 Long term (current) use of inhaled steroids: Secondary | ICD-10-CM | POA: Diagnosis not present

## 2019-02-17 DIAGNOSIS — J441 Chronic obstructive pulmonary disease with (acute) exacerbation: Secondary | ICD-10-CM | POA: Diagnosis not present

## 2019-02-17 DIAGNOSIS — I509 Heart failure, unspecified: Secondary | ICD-10-CM | POA: Diagnosis not present

## 2019-02-17 DIAGNOSIS — I2699 Other pulmonary embolism without acute cor pulmonale: Secondary | ICD-10-CM | POA: Diagnosis not present

## 2019-02-17 DIAGNOSIS — I110 Hypertensive heart disease with heart failure: Secondary | ICD-10-CM | POA: Diagnosis not present

## 2019-02-17 DIAGNOSIS — Z7984 Long term (current) use of oral hypoglycemic drugs: Secondary | ICD-10-CM | POA: Diagnosis not present

## 2019-02-17 DIAGNOSIS — J9611 Chronic respiratory failure with hypoxia: Secondary | ICD-10-CM | POA: Diagnosis not present

## 2019-02-17 DIAGNOSIS — E119 Type 2 diabetes mellitus without complications: Secondary | ICD-10-CM | POA: Diagnosis not present

## 2019-02-17 DIAGNOSIS — Z7952 Long term (current) use of systemic steroids: Secondary | ICD-10-CM | POA: Diagnosis not present

## 2019-02-20 ENCOUNTER — Inpatient Hospital Stay: Payer: Medicare PPO | Admitting: Oncology

## 2019-02-20 ENCOUNTER — Other Ambulatory Visit: Payer: Medicare PPO

## 2019-02-25 ENCOUNTER — Ambulatory Visit: Payer: Medicare PPO | Admitting: Family

## 2019-03-06 ENCOUNTER — Other Ambulatory Visit: Payer: Self-pay | Admitting: Oncology

## 2019-03-06 ENCOUNTER — Other Ambulatory Visit: Payer: Self-pay

## 2019-03-06 ENCOUNTER — Ambulatory Visit
Admission: RE | Admit: 2019-03-06 | Discharge: 2019-03-06 | Disposition: A | Discharge status: Home / Self Care | Payer: Medicare PPO | Source: Ambulatory Visit | Attending: Oncology | Admitting: Oncology

## 2019-03-06 DIAGNOSIS — I2510 Atherosclerotic heart disease of native coronary artery without angina pectoris: Secondary | ICD-10-CM | POA: Diagnosis not present

## 2019-03-06 DIAGNOSIS — I700 Atherosclerosis of aorta: Secondary | ICD-10-CM | POA: Diagnosis not present

## 2019-03-06 DIAGNOSIS — R590 Localized enlarged lymph nodes: Secondary | ICD-10-CM | POA: Insufficient documentation

## 2019-03-06 LAB — GLUCOSE, CAPILLARY: Glucose-Capillary: 121 mg/dL — ABNORMAL HIGH (ref 70–99)

## 2019-03-06 MED ORDER — FLUDEOXYGLUCOSE F - 18 (FDG) INJECTION
11.3000 | Freq: Once | INTRAVENOUS | Status: AC | PRN
  Administered 2019-03-06: 11.74 via INTRAVENOUS

## 2019-03-08 DIAGNOSIS — J449 Chronic obstructive pulmonary disease, unspecified: Secondary | ICD-10-CM | POA: Diagnosis not present

## 2019-03-12 ENCOUNTER — Other Ambulatory Visit: Payer: Self-pay

## 2019-03-12 ENCOUNTER — Other Ambulatory Visit: Payer: Medicare PPO

## 2019-03-12 NOTE — Progress Notes (Signed)
Tumor Board Documentation  Julian Sparks was presented by Dr Janese Banks at our Tumor Board on 03/12/2019, which included representatives from medical oncology, radiation oncology, surgical oncology, surgical, radiology, pathology, navigation, internal medicine, pharmacy, palliative care, pulmonology, research.  Julian Sparks currently presents as a current patient, for discussion with history of the following treatments: active survellience.  Additionally, we reviewed previous medical and familial history, history of present illness, and recent lab results along with all available histopathologic and imaging studies. The tumor board considered available treatment options and made the following recommendations: Biopsy CT Biopsy  The following procedures/referrals were also placed: No orders of the defined types were placed in this encounter.   Clinical Trial Status: not discussed   Staging used: Not Applicable  National site-specific guidelines   were discussed with respect to the case.  Tumor board is a meeting of clinicians from various specialty areas who evaluate and discuss patients for whom a multidisciplinary approach is being considered. Final determinations in the plan of care are those of the provider(s). The responsibility for follow up of recommendations given during tumor board is that of the provider.   Today's extended care, comprehensive team conference, Julian Sparks was not present for the discussion and was not examined.   Multidisciplinary Tumor Board is a multidisciplinary case peer review process.  Decisions discussed in the Multidisciplinary Tumor Board reflect the opinions of the specialists present at the conference without having examined the patient.  Ultimately, treatment and diagnostic decisions rest with the primary provider(s) and the patient.

## 2019-03-13 ENCOUNTER — Encounter: Payer: Self-pay | Admitting: Oncology

## 2019-03-13 ENCOUNTER — Other Ambulatory Visit: Payer: Self-pay

## 2019-03-13 ENCOUNTER — Other Ambulatory Visit: Payer: Self-pay | Admitting: *Deleted

## 2019-03-13 ENCOUNTER — Inpatient Hospital Stay: Payer: Medicare PPO | Attending: Oncology | Admitting: Oncology

## 2019-03-13 VITALS — BP 160/77 | Temp 96.4°F | Resp 20 | Wt 210.4 lb

## 2019-03-13 DIAGNOSIS — R591 Generalized enlarged lymph nodes: Secondary | ICD-10-CM | POA: Diagnosis not present

## 2019-03-13 DIAGNOSIS — R948 Abnormal results of function studies of other organs and systems: Secondary | ICD-10-CM | POA: Diagnosis not present

## 2019-03-13 DIAGNOSIS — R599 Enlarged lymph nodes, unspecified: Secondary | ICD-10-CM

## 2019-03-13 DIAGNOSIS — R590 Localized enlarged lymph nodes: Secondary | ICD-10-CM | POA: Diagnosis not present

## 2019-03-13 NOTE — Progress Notes (Signed)
Hematology/Oncology Consult note [facility] [place]  [phone] [phone]  Patient Care Team: [name], MD as PCP - General (Internal Medicine)   Name of the patient: Julian Sparks  [id number]  [birth year]   Date of visit: 03/13/19  Diagnosis-intra-abdominal adenopathy  Chief complaint/ Reason for visit-discussed PET/CT results  Heme/Onc history: Patient is a 73 year old male with a past medical history significant for diabetes, hypoxic respiratory failure currently on oxygen who presented to the ER with worsening shortness of breath he underwent CT chest with contrast which showed a bandlike consolidation in the right upper lobe compatible with postinfectious/postinflammatory scarring.  Questionable central segmental filling defect cannot rule out acute PE.  Multiple moderately enlarged upper abdominal retroperitoneal lymph nodes ranging from 2 to 2.5 cm in the para-aortic and the gastrohepatic ligament concerning for lymphoma.  This was followed by a CT PE which did not reveal any PE CT abdomen and pelvis with contrast showed bulky retroperitoneal lymph node with 2.9 cm 1.6 cm interaortocaval lymph node, 1.9 cm left-sided retroperitoneal lymph node and scattered mesenteric lymph nodes as well.  No other areas of adenopathy noted  PET CT scan on 03/06/2019 showed mild FDG uptake with persistent upper abdominal adenopathy In the retroperitoneal/aortocaval and gastrohepatic ligament lymph node.  Lymph nodes range between 1.8 to 2.5 cm in size with a low level uptake  Interval history-patient was discharged from the hospital on home oxygen.  Patient reports chronic fatigue.  He has been staying mostly at home and only going out for doctor's appointments   ECOG PS- 1 Pain scale- 0   Review of systems- Review of Systems  Constitutional: Positive for malaise/fatigue. Negative for chills, fever and weight loss.  HENT: Negative for congestion, ear  discharge and nosebleeds.   Eyes: Negative for blurred vision.  Respiratory: Positive for shortness of breath. Negative for cough, hemoptysis, sputum production and wheezing.   Cardiovascular: Negative for chest pain, palpitations, orthopnea and claudication.  Gastrointestinal: Negative for abdominal pain, blood in stool, constipation, diarrhea, heartburn, melena, nausea and vomiting.  Genitourinary: Negative for dysuria, flank pain, frequency, hematuria and urgency.  Musculoskeletal: Negative for back pain, joint pain and myalgias.  Skin: Negative for rash.  Neurological: Negative for dizziness, tingling, focal weakness, seizures, weakness and headaches.  Endo/Heme/Allergies: Does not bruise/bleed easily.  Psychiatric/Behavioral: Negative for depression and suicidal ideas. The patient does not have insomnia.        Allergies  Allergen Reactions   Aspirin Nausea Only and Other (See Comments)    325 mg aspirin cause stomach upset   Penicillins Rash     Past Medical History:  Diagnosis Date   Chronic bronchitis (HCC)    Diabetes mellitus without complication (HCC)    Diastolic dysfunction    Dyspnea    with exertion   Hypertension      Past Surgical History:  Procedure Laterality Date   CIRCUMCISION     SHOULDER ARTHROSCOPY WITH OPEN ROTATOR CUFF REPAIR Left 06/20/2016   Procedure: SHOULDER ARTHROSCOPY WITH OPEN ROTATOR CUFF REPAIR;  Surgeon: Earnestine Leys, MD;  Location: [facility] ORS;  Service: Orthopedics;  Laterality: Left;   UPPER GI ENDOSCOPY      Social History   Socioeconomic History   Marital status: Married    Spouse name: Not on file   Number of children: Not on file   Years of education: Not on file   Highest education level: Not on file  Occupational History   Not on file  Social Designer, fashion/clothing strain: Not on file   Food insecurity    Worry: Not on file    Inability: Not on file   Transportation needs    Medical: Not on  file    Non-medical: Not on file  Tobacco Use   Smoking status: Former Smoker   Smokeless tobacco: Current User  Substance and Sexual Activity   Alcohol use: No   Drug use: No   Sexual activity: Not on file  Lifestyle   Physical activity    Days per week: Not on file    Minutes per session: Not on file   Stress: Not on file  Relationships   Social connections    Talks on phone: Not on file    Gets together: Not on file    Attends religious service: Not on file    Active member of club or organization: Not on file    Attends meetings of clubs or organizations: Not on file    Relationship status: Not on file   Intimate partner violence    Fear of current or ex partner: Not on file    Emotionally abused: Not on file    Physically abused: Not on file    Forced sexual activity: Not on file  Other Topics Concern   Not on file  Social History Narrative   Not on file    Family History  Problem Relation Age of Onset   Hypertension Mother      Current Outpatient Medications:    albuterol (VENTOLIN HFA) 108 (90 Base) MCG/ACT inhaler, Inhale 2 puffs into the lungs as needed for wheezing or shortness of breath., Disp: , Rfl:    aspirin 81 MG EC tablet, Take 81 mg by mouth daily. Swallow whole., Disp: , Rfl:    atorvastatin (LIPITOR) 40 MG tablet, Take 40 mg by mouth every evening., Disp: , Rfl:    B Complex-C (SUPER B COMPLEX PO), Take 1 tablet by mouth daily., Disp: , Rfl:    budesonide-formoterol (SYMBICORT) 160-4.5 MCG/ACT inhaler, Inhale 2 puffs into the lungs 2 (two) times daily., Disp: , Rfl:    cyclobenzaprine (FLEXERIL) 10 MG tablet, Take 10 mg by mouth at bedtime., Disp: , Rfl:    fluticasone (FLONASE) 50 MCG/ACT nasal spray, Place 1 spray into both nostrils 2 (two) times a day., Disp: , Rfl:    gabapentin (NEURONTIN) 100 MG capsule, Take 200 mg by mouth 3 (three) times daily., Disp: , Rfl:    glipiZIDE (GLUCOTROL) 5 MG tablet, Take 5 mg by mouth 2  (two) times daily before a meal., Disp: , Rfl:    losartan-hydrochlorothiazide (HYZAAR) 100-25 MG tablet, Take 1 tablet by mouth daily., Disp: , Rfl:    metFORMIN (GLUCOPHAGE) 500 MG tablet, Take by mouth 2 (two) times daily with a meal., Disp: , Rfl:    Omega-3 Fatty Acids (FISH OIL PO), Take 2 capsules by mouth 2 (two) times daily., Disp: , Rfl:    traZODone (DESYREL) 50 MG tablet, Take 100 mg by mouth at bedtime., Disp: , Rfl:    vitamin C (ASCORBIC ACID) 500 MG tablet, Take 500 mg by mouth daily., Disp: , Rfl:   Physical exam:  Vitals:   03/13/19 1012  BP: (!) 160/77  Resp: 20  Temp: (!) 96.4 F (35.8 C)  TempSrc: Tympanic  Weight: 210 lb 6.4 oz (95.4 kg)   Physical Exam Constitutional:      Comments: He is on home oxygen  HENT:  Head: Normocephalic and atraumatic.  Eyes:     Pupils: Pupils are equal, round, and reactive to light.  Neck:     Musculoskeletal: Normal range of motion.  Cardiovascular:     Rate and Rhythm: Normal rate and regular rhythm.     Heart sounds: Normal heart sounds.  Pulmonary:     Effort: Pulmonary effort is normal.     Breath sounds: Normal breath sounds.  Abdominal:     General: Bowel sounds are normal.     Palpations: Abdomen is soft.  Skin:    General: Skin is warm and dry.  Neurological:     Mental Status: He is alert and oriented to person, place, and time.      CMP Latest Ref Rng & Units 02/06/2019  Glucose 70 - 99 mg/dL 201(H)  BUN 8 - 23 mg/dL 31(H)  Creatinine 0.61 - 1.24 mg/dL 0.89  Sodium 135 - 145 mmol/L 135  Potassium 3.5 - 5.1 mmol/L 3.9  Chloride 98 - 111 mmol/L 100  CO2 22 - 32 mmol/L 28  Calcium 8.9 - 10.3 mg/dL 8.6(L)  Total Protein 6.4 - 8.2 g/dL -  Total Bilirubin 0.2 - 1.0 mg/dL -  Alkaline Phos 50 - 136 Unit/L -  AST 15 - 37 Unit/L -  ALT U/L -   CBC Latest Ref Rng & Units 02/05/2019  WBC 4.0 - 10.5 K/uL 12.8(H)  Hemoglobin 13.0 - 17.0 g/dL 16.1  Hematocrit 39.0 - 52.0 % 48.5  Platelets 150 - 400  K/uL 224    No images are attached to the encounter.  Nm Pet Image Initial (pi) Skull Base To Thigh  Result Date: 03/06/2019 CLINICAL DATA:  Initial treatment strategy for abdominal adenopathy. EXAM: NUCLEAR MEDICINE PET SKULL BASE TO THIGH TECHNIQUE: 11.7 mCi F-18 FDG was injected intravenously. Full-ring PET imaging was performed from the skull base to thigh after the radiotracer. CT data was obtained and used for attenuation correction and anatomic localization. Fasting blood glucose: 121 mg/dl COMPARISON:  02/05/2019. FINDINGS: Mediastinal blood pool activity: SUV max 3.5 Liver activity: SUV max NA NECK: No hypermetabolic lymph nodes in the neck. Incidental CT findings: none CHEST: No FDG avid axillary, supraclavicular, mediastinal, or hilar lymph nodes. No hypermetabolic pulmonary nodule or mass identified. Subsegmental atelectasis/scar is identified within the lingula and medial right upper lobe. Similar to previous exam. Incidental CT findings: Emphysema. Aortic atherosclerosis. Calcification in the LAD, RCA coronary arteries noted. ABDOMEN/PELVIS: No abnormal uptake within the liver, pancreas, spleen, or adrenal glands. Adenopathy within the abdomen is again identified. Index gastrohepatic ligament node measures 2.3 cm with SUV max of 3.6. Aortocaval lymph node measures 1.8 cm and has SUV max of 4.45. Left retroperitoneal lymph node measures 1.8 cm and has SUV max of 2.76. Right retroperitoneal lymph node measures 2.5 cm with SUV max of 3.02. No enlarged mesenteric lymph nodes. No pelvic or inguinal adenopathy. Incidental CT findings: Aortic atherosclerosis.  No aneurysm. SKELETON: No focal hypermetabolic activity to suggest skeletal metastasis. Incidental CT findings: none IMPRESSION: 1. Mild FDG uptake is associated with the persistent upper abdominal adenopathy which is new from 02/11/2012. Findings are nonspecific. Low-grade lymphoproliferative disorder cannot be excluded. 2. Aortic  Atherosclerosis (ICD10-I70.0) and Emphysema (ICD10-J43.9). Coronary artery calcifications. Electronically Signed   By: Kerby Moors M.D.   On: 03/06/2019 13:07     Assessment and plan- Patient is a 73 y.o. male found to have incidental intra-abdominal adenopathy  I have reviewed PET/CT scan images independently and discussed findings with  the patient.Patient noted to have incidental abdominal adenopathy in the retroperitoneum and aortocaval regions with low level of SUV uptake around 4.  The etiology of adenopathy is currently unclear.  We discussed his case at tumor board yesterday and plan is to proceed with CT-guided biopsy of the retroperitoneal lymph node.  Patient requests that this biopsy should only be done on a Friday so his daughter can bring him for the appointment.  I will tentatively see him back in 3 weeks time after the biopsy results are back for a video visit on that day   Visit Diagnosis 1. Adenopathy   2. Abnormal PET scan of retroperitoneum      Dr. Randa Evens, MD, MPH [facility] [phone] 03/13/2019 11:38 AM

## 2019-03-13 NOTE — Progress Notes (Signed)
Patient is here for ollow up, he is doing well, would like to see about getting portable oxygen concentrator. His tank is too much to carry around.

## 2019-03-14 DIAGNOSIS — J449 Chronic obstructive pulmonary disease, unspecified: Secondary | ICD-10-CM | POA: Diagnosis not present

## 2019-03-19 ENCOUNTER — Telehealth: Payer: Self-pay | Admitting: *Deleted

## 2019-03-19 NOTE — Telephone Encounter (Signed)
I called the daughter cindy and went over the procedure of bx 7/31 to arrive at 8:30 and the procedure 9:30 and he will need to be NPO after midnight the night before the procedure. Any med with just sip of water. And will need a driver but daughter is taking him and staying until procedure over with. I told her it is usually 2-4 hours depending on how fast he is able to wake up and drink fluids. She is agreeable and then I made appt for 8/7 to see md at 10:45.

## 2019-03-25 ENCOUNTER — Other Ambulatory Visit: Payer: Self-pay | Admitting: Radiology

## 2019-03-27 ENCOUNTER — Ambulatory Visit: Payer: Medicare PPO

## 2019-03-27 ENCOUNTER — Ambulatory Visit
Admission: RE | Admit: 2019-03-27 | Discharge: 2019-03-27 | Disposition: A | Discharge status: Home / Self Care | Payer: Medicare PPO | Source: Ambulatory Visit | Attending: Oncology | Admitting: Oncology

## 2019-03-27 ENCOUNTER — Other Ambulatory Visit: Payer: Self-pay

## 2019-03-27 DIAGNOSIS — R591 Generalized enlarged lymph nodes: Secondary | ICD-10-CM | POA: Diagnosis not present

## 2019-03-27 DIAGNOSIS — Z7984 Long term (current) use of oral hypoglycemic drugs: Secondary | ICD-10-CM | POA: Diagnosis not present

## 2019-03-27 DIAGNOSIS — C9110 Chronic lymphocytic leukemia of B-cell type not having achieved remission: Secondary | ICD-10-CM | POA: Diagnosis not present

## 2019-03-27 DIAGNOSIS — E119 Type 2 diabetes mellitus without complications: Secondary | ICD-10-CM | POA: Insufficient documentation

## 2019-03-27 DIAGNOSIS — Z886 Allergy status to analgesic agent status: Secondary | ICD-10-CM | POA: Insufficient documentation

## 2019-03-27 DIAGNOSIS — Z79899 Other long term (current) drug therapy: Secondary | ICD-10-CM | POA: Diagnosis not present

## 2019-03-27 DIAGNOSIS — I10 Essential (primary) hypertension: Secondary | ICD-10-CM | POA: Diagnosis not present

## 2019-03-27 DIAGNOSIS — Z87891 Personal history of nicotine dependence: Secondary | ICD-10-CM | POA: Diagnosis not present

## 2019-03-27 DIAGNOSIS — Z880 Allergy status to penicillin: Secondary | ICD-10-CM | POA: Insufficient documentation

## 2019-03-27 DIAGNOSIS — Z7951 Long term (current) use of inhaled steroids: Secondary | ICD-10-CM | POA: Insufficient documentation

## 2019-03-27 DIAGNOSIS — Z8249 Family history of ischemic heart disease and other diseases of the circulatory system: Secondary | ICD-10-CM | POA: Insufficient documentation

## 2019-03-27 DIAGNOSIS — Z7982 Long term (current) use of aspirin: Secondary | ICD-10-CM | POA: Insufficient documentation

## 2019-03-27 DIAGNOSIS — R599 Enlarged lymph nodes, unspecified: Secondary | ICD-10-CM

## 2019-03-27 DIAGNOSIS — J449 Chronic obstructive pulmonary disease, unspecified: Secondary | ICD-10-CM | POA: Diagnosis not present

## 2019-03-27 DIAGNOSIS — R109 Unspecified abdominal pain: Secondary | ICD-10-CM | POA: Diagnosis not present

## 2019-03-27 DIAGNOSIS — R590 Localized enlarged lymph nodes: Secondary | ICD-10-CM | POA: Diagnosis not present

## 2019-03-27 HISTORY — DX: Chronic obstructive pulmonary disease, unspecified: J44.9

## 2019-03-27 LAB — GLUCOSE, CAPILLARY: Glucose-Capillary: 150 mg/dL — ABNORMAL HIGH (ref 70–99)

## 2019-03-27 LAB — CBC WITH DIFFERENTIAL/PLATELET
Abs Immature Granulocytes: 0.07 10*3/uL (ref 0.00–0.07)
Basophils Absolute: 0.1 10*3/uL (ref 0.0–0.1)
Basophils Relative: 1 %
Eosinophils Absolute: 0.4 10*3/uL (ref 0.0–0.5)
Eosinophils Relative: 3 %
HCT: 46.6 % (ref 39.0–52.0)
Hemoglobin: 15.1 g/dL (ref 13.0–17.0)
Immature Granulocytes: 1 %
Lymphocytes Relative: 35 %
Lymphs Abs: 3.6 10*3/uL (ref 0.7–4.0)
MCH: 31.7 pg (ref 26.0–34.0)
MCHC: 32.4 g/dL (ref 30.0–36.0)
MCV: 97.9 fL (ref 80.0–100.0)
Monocytes Absolute: 0.7 10*3/uL (ref 0.1–1.0)
Monocytes Relative: 6 %
Neutro Abs: 5.7 10*3/uL (ref 1.7–7.7)
Neutrophils Relative %: 54 %
Platelets: 209 10*3/uL (ref 150–400)
RBC: 4.76 MIL/uL (ref 4.22–5.81)
RDW: 14 % (ref 11.5–15.5)
WBC: 10.4 10*3/uL (ref 4.0–10.5)
nRBC: 0 % (ref 0.0–0.2)

## 2019-03-27 LAB — PROTIME-INR
INR: 0.9 (ref 0.8–1.2)
Prothrombin Time: 12.2 seconds (ref 11.4–15.2)

## 2019-03-27 LAB — BASIC METABOLIC PANEL
Anion gap: 10 (ref 5–15)
BUN: 28 mg/dL — ABNORMAL HIGH (ref 8–23)
CO2: 27 mmol/L (ref 22–32)
Calcium: 9 mg/dL (ref 8.9–10.3)
Chloride: 106 mmol/L (ref 98–111)
Creatinine, Ser: 0.82 mg/dL (ref 0.61–1.24)
GFR calc Af Amer: >60 mL/min (ref >60)
GFR calc non Af Amer: >60 mL/min (ref >60)
Glucose, Bld: 156 mg/dL — ABNORMAL HIGH (ref 70–99)
Potassium: 4.2 mmol/L (ref 3.5–5.1)
Sodium: 143 mmol/L (ref 135–145)

## 2019-03-27 MED ORDER — FENTANYL CITRATE (PF) 100 MCG/2ML IJ SOLN
INTRAMUSCULAR | Status: AC
  Filled 2019-03-27: qty 4

## 2019-03-27 MED ORDER — MIDAZOLAM HCL 5 MG/5ML IJ SOLN
INTRAMUSCULAR | Status: AC | PRN
  Administered 2019-03-27 (×2): 1 mg via INTRAVENOUS

## 2019-03-27 MED ORDER — MIDAZOLAM HCL 5 MG/5ML IJ SOLN
INTRAMUSCULAR | Status: AC
  Filled 2019-03-27: qty 5

## 2019-03-27 MED ORDER — SODIUM CHLORIDE 0.9 % IV SOLN
INTRAVENOUS | Status: DC
  Administered 2019-03-27: 09:00:00 via INTRAVENOUS

## 2019-03-27 MED ORDER — FENTANYL CITRATE (PF) 100 MCG/2ML IJ SOLN
INTRAMUSCULAR | Status: AC | PRN
  Administered 2019-03-27: 50 ug via INTRAVENOUS

## 2019-03-27 NOTE — Procedures (Signed)
Interventional Radiology Procedure Note  Procedure: CT guided RP node biopsy.    Complications: None Recommendations:  - Ok to shower tomorrow - Do not submerge for 7 days - Routine care   Signed,  Dulcy Fanny. Earleen Newport, DO

## 2019-03-27 NOTE — H&P (Signed)
Chief Complaint: Lymphadenopathy  Referring Physician(s): Sindy Guadeloupe  Supervising Physician: Corrie Mckusick  Patient Status: [facility] - Out-pt  History of Present Illness: Julian Sparks is a 73 y.o. male presenting with retroperitoneal adenopathy, with low grade activity on PET, concerning for lymphoma.   He presents to [facility] today for image guided biopsy.   He denies any fever, rigors, chills, in usual state of health.   Past Medical History:  Diagnosis Date  . Chronic bronchitis ([place])   . COPD (chronic obstructive pulmonary disease) ([place])   . Diabetes mellitus without complication ([place])   . Diastolic dysfunction   . Dyspnea    with exertion  . Hypertension     Past Surgical History:  Procedure Laterality Date  . CIRCUMCISION    . SHOULDER ARTHROSCOPY WITH OPEN ROTATOR CUFF REPAIR Left 06/20/2016   Procedure: SHOULDER ARTHROSCOPY WITH OPEN ROTATOR CUFF REPAIR;  Surgeon: Earnestine Leys, MD;  Location: [facility] ORS;  Service: Orthopedics;  Laterality: Left;  . UPPER GI ENDOSCOPY      Allergies: Aspirin and Penicillins  Medications: Prior to Admission medications   Medication Sig Start Date End Date Taking? Authorizing Provider  albuterol (VENTOLIN HFA) 108 (90 Base) MCG/ACT inhaler Inhale 2 puffs into the lungs as needed for wheezing or shortness of breath.   Yes [provider]  aspirin 81 MG EC tablet Take 81 mg by mouth daily. Swallow whole.   Yes [provider]  atorvastatin (LIPITOR) 40 MG tablet Take 40 mg by mouth every evening. 12/30/18  Yes [provider]  B Complex-C (SUPER B COMPLEX PO) Take 1 tablet by mouth daily.   Yes [provider]  budesonide-formoterol (SYMBICORT) 160-4.5 MCG/ACT inhaler Inhale 2 puffs into the lungs 2 (two) times daily.   Yes [provider]  cyclobenzaprine (FLEXERIL) 10 MG tablet Take 10 mg by mouth at bedtime.   Yes [provider]  fluticasone (FLONASE) 50 MCG/ACT nasal spray  Place 1 spray into both nostrils 2 (two) times a day. 11/04/18  Yes [provider]  gabapentin (NEURONTIN) 100 MG capsule Take 200 mg by mouth 3 (three) times daily. 02/04/19  Yes [provider]  glipiZIDE (GLUCOTROL) 5 MG tablet Take 5 mg by mouth 2 (two) times daily before a meal.   Yes [provider]  losartan-hydrochlorothiazide (HYZAAR) 100-25 MG tablet Take 1 tablet by mouth daily.   Yes [provider]  metFORMIN (GLUCOPHAGE) 500 MG tablet Take by mouth 2 (two) times daily with a meal.   Yes [provider]  Omega-3 Fatty Acids (FISH OIL PO) Take 2 capsules by mouth 2 (two) times daily.   Yes [provider]  traZODone (DESYREL) 50 MG tablet Take 100 mg by mouth at bedtime. 11/21/18  Yes [provider]  vitamin C (ASCORBIC ACID) 500 MG tablet Take 500 mg by mouth daily.   Yes [provider]     Family History  Problem Relation Age of Onset  . Hypertension Mother     Social History   Socioeconomic History  . Marital status: Widowed    Spouse name: Not on file  . Number of children: 2  . Years of education: Not on file  . Highest education level: Not on file  Occupational History  . Not on file  Social Needs  . Financial resource strain: Not hard at all  . Food insecurity    Worry: Never true    Inability: Never true  . Transportation needs  Medical: No    Non-medical: No  Tobacco Use  . Smoking status: Former Smoker    Quit date: 03/27/2011    Years since quitting: 8.0  . Smokeless tobacco: Current User    Types: Chew  Substance and Sexual Activity  . Alcohol use: No  . Drug use: No  . Sexual activity: Not on file  Lifestyle  . Physical activity    Days per week: 0 days    Minutes per session: 0 min  . Stress: Not at all  Relationships  . Social connections    Talks on phone: More than three times a week    Gets together: More than three times a week    Attends religious service: Never     Active member of club or organization: No    Attends meetings of clubs or organizations: Never    Relationship status: Widowed  Other Topics Concern  . Not on file  Social History Narrative  . Not on file       Review of Systems: A 12 point ROS discussed and pertinent positives are indicated in the HPI above.  All other systems are negative.  Review of Systems  Vital Signs: BP 132/64   Pulse 84   Temp 98.4 F (36.9 C) (Oral)   Resp 14   Ht 5\' 6"  (1.676 m)   Wt 97.1 kg   SpO2 99%   BMI 34.54 kg/m   Physical Exam General: 73 yo male appearing stated age.  Well-developed, well-nourished.  No distress. HEENT: Atraumatic, normocephalic.  Conjugate gaze, extra-ocular motor intact. No scleral icterus or scleral injection. No lesions on external ears, nose, lips, or gums.  Oral mucosa moist, pink.  Neck: Symmetric with no goiter enlargement.  Chest/Lungs:  Symmetric chest with inspiration/expiration.  No labored breathing.  Clear to auscultation with no wheezes, rhonchi, or rales.  Heart:  RRR, with no third heart sounds appreciated. No JVD appreciated.  Abdomen:  Soft, NT/ND, with + bowel sounds.   Genito-urinary: Deferred Neurologic: Alert & Oriented to person, place, and time.   Normal affect and insight.  Appropriate questions.  Moving all 4 extremities with gross sensory intact.  .  Imaging: Nm Pet Image Initial (pi) Skull Base To Thigh  Result Date: 03/06/2019 CLINICAL DATA:  Initial treatment strategy for abdominal adenopathy. EXAM: NUCLEAR MEDICINE PET SKULL BASE TO THIGH TECHNIQUE: 11.7 mCi F-18 FDG was injected intravenously. Full-ring PET imaging was performed from the skull base to thigh after the radiotracer. CT data was obtained and used for attenuation correction and anatomic localization. Fasting blood glucose: 121 mg/dl COMPARISON:  02/05/2019. FINDINGS: Mediastinal blood pool activity: SUV max 3.5 Liver activity: SUV max NA NECK: No hypermetabolic lymph nodes in  the neck. Incidental CT findings: none CHEST: No FDG avid axillary, supraclavicular, mediastinal, or hilar lymph nodes. No hypermetabolic pulmonary nodule or mass identified. Subsegmental atelectasis/scar is identified within the lingula and medial right upper lobe. Similar to previous exam. Incidental CT findings: Emphysema. Aortic atherosclerosis. Calcification in the LAD, RCA coronary arteries noted. ABDOMEN/PELVIS: No abnormal uptake within the liver, pancreas, spleen, or adrenal glands. Adenopathy within the abdomen is again identified. Index gastrohepatic ligament node measures 2.3 cm with SUV max of 3.6. Aortocaval lymph node measures 1.8 cm and has SUV max of 4.45. Left retroperitoneal lymph node measures 1.8 cm and has SUV max of 2.76. Right retroperitoneal lymph node measures 2.5 cm with SUV max of 3.02. No enlarged mesenteric lymph nodes. No pelvic or inguinal  adenopathy. Incidental CT findings: Aortic atherosclerosis.  No aneurysm. SKELETON: No focal hypermetabolic activity to suggest skeletal metastasis. Incidental CT findings: none IMPRESSION: 1. Mild FDG uptake is associated with the persistent upper abdominal adenopathy which is new from 02/11/2012. Findings are nonspecific. Low-grade lymphoproliferative disorder cannot be excluded. 2. Aortic Atherosclerosis (ICD10-I70.0) and Emphysema (ICD10-J43.9). Coronary artery calcifications. Electronically Signed   By: Kerby Moors M.D.   On: 03/06/2019 13:07    Labs:  CBC: Recent Labs    02/04/19 1123 02/05/19 0925 03/27/19 0856  WBC 9.6 12.8* 10.4  HGB 14.9 16.1 15.1  HCT 45.4 48.5 46.6  PLT 218 224 209    COAGS: Recent Labs    02/04/19 1630 03/27/19 0856  INR 1.0 0.9  APTT 32  --     BMP: Recent Labs    02/04/19 1123 02/05/19 0207 02/06/19 0442 03/27/19 0856  NA 139 138 135 143  K 4.0 4.4 3.9 4.2  CL 101 98 100 106  CO2 29 29 28 27   GLUCOSE 139* 229* 201* 156*  BUN 21 22 31* 28*  CALCIUM 8.9 9.1 8.6* 9.0  CREATININE  0.74 0.90 0.89 0.82  GFRNONAA >60 >60 >60 >60  GFRAA >60 >60 >60 >60    LIVER FUNCTION TESTS: No results for input(s): BILITOT, AST, ALT, ALKPHOS, PROT, ALBUMIN in the last 8760 hours.  TUMOR MARKERS: No results for input(s): AFPTM, CEA, CA199, CHROMGRNA in the last 8760 hours.  Assessment and Plan:  73 yo male presents for image guided RP node biopsy.   Risks and benefits of biopsy was discussed with the patient and/or patient's family including, but not limited to bleeding, infection, damage to adjacent structures or low yield requiring additional tests.  All of the questions were answered and there is agreement to proceed.  Consent signed and in chart.  Thank you for this interesting consult.  I greatly enjoyed meeting [name] and look forward to participating in their care.  A copy of this report was sent to the requesting provider on this date.  Electronically Signed: Corrie Mckusick, DO 03/27/2019, 10:24 AM   I spent a total of  15 Minutes   in face to face in clinical consultation, greater than 50% of which was counseling/coordinating care for abdominal lymph node biopsy.

## 2019-03-31 ENCOUNTER — Encounter: Payer: Self-pay | Admitting: Oncology

## 2019-03-31 LAB — SURGICAL PATHOLOGY

## 2019-04-02 ENCOUNTER — Other Ambulatory Visit: Payer: Self-pay | Admitting: Oncology

## 2019-04-02 ENCOUNTER — Other Ambulatory Visit: Payer: Self-pay

## 2019-04-03 ENCOUNTER — Other Ambulatory Visit: Payer: Self-pay

## 2019-04-03 ENCOUNTER — Encounter: Payer: Self-pay | Admitting: Oncology

## 2019-04-03 ENCOUNTER — Inpatient Hospital Stay: Payer: Medicare PPO | Attending: Oncology | Admitting: Oncology

## 2019-04-03 VITALS — BP 158/90 | HR 91 | Temp 97.9°F | Wt 213.6 lb

## 2019-04-03 DIAGNOSIS — M7550 Bursitis of unspecified shoulder: Secondary | ICD-10-CM | POA: Insufficient documentation

## 2019-04-03 DIAGNOSIS — C8303 Small cell B-cell lymphoma, intra-abdominal lymph nodes: Secondary | ICD-10-CM | POA: Insufficient documentation

## 2019-04-06 NOTE — Progress Notes (Signed)
Hematology/Oncology Consult note [facility]  Telephone:([phone] [phone]  Patient Care Team: [name], MD as PCP - General (Internal Medicine)   Name of the patient: Julian Sparks  [id number]  09/05/1945   Date of visit: 04/06/19  Diagnosis- SLL  Chief complaint/ Reason for visit- discuss pathology results and further management  Heme/Onc history: Patient is a 73 year old male with a past medical history significant for diabetes, hypoxic respiratory failure currently on oxygen who presented to the ER with worsening shortness of breath he underwent CT chest with contrast which showed a bandlike consolidation in the right upper lobe compatible with postinfectious/postinflammatory scarring. Questionable central segmental filling defect cannot rule out acute PE. Multiple moderately enlarged upper abdominal retroperitoneal lymph nodes ranging from 2 to 2.5 cm in the para-aortic and the gastrohepatic ligament concerning for lymphoma. This was followed by a CT PE which did not reveal any PE CT abdomen and pelvis with contrast showed bulky retroperitoneal lymph node with 2.9 cm 1.6 cm interaortocaval lymph node, 1.9 cm left-sided retroperitoneal lymph node and scattered mesenteric lymph nodes as well. No other areas of adenopathy noted  PET CT scan on 03/06/2019 showed mild FDG uptake with persistent upper abdominal adenopathy In the retroperitoneal/aortocaval and gastrohepatic ligament lymph node.  Lymph nodes range between 1.8 to 2.5 cm in size with a low level uptake  Interval history-overall he feels well and denies any complaints at this time.  He does have chronic fatigue and exertional shortness of breath and is on home oxygen.  ECOG PS- 2 Pain scale- 0   Review of systems- Review of Systems  Constitutional: Positive for malaise/fatigue. Negative for chills, fever and weight loss.  HENT: Negative for congestion, ear discharge and  nosebleeds.   Eyes: Negative for blurred vision.  Respiratory: Positive for shortness of breath. Negative for cough, hemoptysis, sputum production and wheezing.   Cardiovascular: Negative for chest pain, palpitations, orthopnea and claudication.  Gastrointestinal: Negative for abdominal pain, blood in stool, constipation, diarrhea, heartburn, melena, nausea and vomiting.  Genitourinary: Negative for dysuria, flank pain, frequency, hematuria and urgency.  Musculoskeletal: Negative for back pain, joint pain and myalgias.  Skin: Negative for rash.  Neurological: Negative for dizziness, tingling, focal weakness, seizures, weakness and headaches.  Endo/Heme/Allergies: Does not bruise/bleed easily.  Psychiatric/Behavioral: Negative for depression and suicidal ideas. The patient does not have insomnia.        Allergies  Allergen Reactions   Aspirin Nausea Only and Other (See Comments)    325 mg aspirin cause stomach upset   Penicillins Rash     Past Medical History:  Diagnosis Date   Chronic bronchitis (HCC)    COPD (chronic obstructive pulmonary disease) (HCC)    Diabetes mellitus without complication (HCC)    Diastolic dysfunction    Dyspnea    with exertion   Hypertension      Past Surgical History:  Procedure Laterality Date   CIRCUMCISION     SHOULDER ARTHROSCOPY WITH OPEN ROTATOR CUFF REPAIR Left 06/20/2016   Procedure: SHOULDER ARTHROSCOPY WITH OPEN ROTATOR CUFF REPAIR;  Surgeon: Earnestine Leys, MD;  Location: [facility] ORS;  Service: Orthopedics;  Laterality: Left;   UPPER GI ENDOSCOPY      Social History   Socioeconomic History   Marital status: Widowed    Spouse name: Not on file   Number of children: 2   Years of education: Not on file   Highest education level: Not on file  Occupational History  Not on file  Social Needs   Financial resource strain: Not hard at all   Food insecurity    Worry: Never true    Inability: Never true    Transportation needs    Medical: No    Non-medical: No  Tobacco Use   Smoking status: Former Smoker    Quit date: 03/27/2011    Years since quitting: 8.0   Smokeless tobacco: Current User    Types: Chew  Substance and Sexual Activity   Alcohol use: No   Drug use: No   Sexual activity: Not on file  Lifestyle   Physical activity    Days per week: 0 days    Minutes per session: 0 min   Stress: Not at all  Relationships   Social connections    Talks on phone: More than three times a week    Gets together: More than three times a week    Attends religious service: Never    Active member of club or organization: No    Attends meetings of clubs or organizations: Never    Relationship status: Widowed   Intimate partner violence    Fear of current or ex partner: Not on file    Emotionally abused: Not on file    Physically abused: Not on file    Forced sexual activity: Not on file  Other Topics Concern   Not on file  Social History Narrative   Not on file    Family History  Problem Relation Age of Onset   Hypertension Mother      Current Outpatient Medications:    Accu-Chek Softclix Lancets lancets, , Disp: , Rfl:    albuterol (VENTOLIN HFA) 108 (90 Base) MCG/ACT inhaler, Inhale 2 puffs into the lungs as needed for wheezing or shortness of breath., Disp: , Rfl:    aspirin 81 MG EC tablet, Take 81 mg by mouth daily. Swallow whole., Disp: , Rfl:    atorvastatin (LIPITOR) 40 MG tablet, Take 40 mg by mouth every evening., Disp: , Rfl:    B Complex-C (SUPER B COMPLEX PO), Take 1 tablet by mouth daily., Disp: , Rfl:    budesonide-formoterol (SYMBICORT) 160-4.5 MCG/ACT inhaler, Inhale 2 puffs into the lungs 2 (two) times daily., Disp: , Rfl:    cyclobenzaprine (FLEXERIL) 10 MG tablet, Take 10 mg by mouth at bedtime., Disp: , Rfl:    fluticasone (FLONASE) 50 MCG/ACT nasal spray, Place 1 spray into both nostrils 2 (two) times a day., Disp: , Rfl:    gabapentin  (NEURONTIN) 100 MG capsule, Take 200 mg by mouth 3 (three) times daily., Disp: , Rfl:    glipiZIDE (GLUCOTROL) 5 MG tablet, Take 5 mg by mouth 2 (two) times daily before a meal., Disp: , Rfl:    losartan-hydrochlorothiazide (HYZAAR) 100-25 MG tablet, Take 1 tablet by mouth daily., Disp: , Rfl:    metFORMIN (GLUCOPHAGE) 500 MG tablet, Take by mouth 2 (two) times daily with a meal., Disp: , Rfl:    Omega-3 Fatty Acids (FISH OIL PO), Take 2 capsules by mouth 2 (two) times daily., Disp: , Rfl:    traZODone (DESYREL) 50 MG tablet, Take 100 mg by mouth at bedtime., Disp: , Rfl:    vitamin C (ASCORBIC ACID) 500 MG tablet, Take 500 mg by mouth daily., Disp: , Rfl:   Physical exam:  Vitals:   04/03/19 1102  BP: (!) 158/90  Pulse: 91  Temp: 97.9 F (36.6 C)  TempSrc: Tympanic  Weight: 213 lb  9.6 oz (96.9 kg)   Physical Exam Constitutional:      General: He is not in acute distress.    Comments: He is on home oxygen  HENT:     Head: Normocephalic and atraumatic.  Eyes:     Pupils: Pupils are equal, round, and reactive to light.  Neck:     Musculoskeletal: Normal range of motion.  Cardiovascular:     Rate and Rhythm: Normal rate and regular rhythm.     Heart sounds: Normal heart sounds.  Pulmonary:     Effort: Pulmonary effort is normal.     Breath sounds: Normal breath sounds.  Abdominal:     General: Bowel sounds are normal.     Palpations: Abdomen is soft.  Skin:    General: Skin is warm and dry.  Neurological:     Mental Status: He is alert and oriented to person, place, and time.      CMP Latest Ref Rng & Units 03/27/2019  Glucose 70 - 99 mg/dL 156(H)  BUN 8 - 23 mg/dL 28(H)  Creatinine 0.61 - 1.24 mg/dL 0.82  Sodium 135 - 145 mmol/L 143  Potassium 3.5 - 5.1 mmol/L 4.2  Chloride 98 - 111 mmol/L 106  CO2 22 - 32 mmol/L 27  Calcium 8.9 - 10.3 mg/dL 9.0  Total Protein 6.4 - 8.2 g/dL -  Total Bilirubin 0.2 - 1.0 mg/dL -  Alkaline Phos 50 - 136 Unit/L -  AST 15 - 37  Unit/L -  ALT U/L -   CBC Latest Ref Rng & Units 03/27/2019  WBC 4.0 - 10.5 K/uL 10.4  Hemoglobin 13.0 - 17.0 g/dL 15.1  Hematocrit 39.0 - 52.0 % 46.6  Platelets 150 - 400 K/uL 209    No images are attached to the encounter.  Ct Biopsy  Result Date: 03/27/2019 INDICATION: 73 year old male with a history retroperitoneal adenopathy EXAM: CT BIOPSY MEDICATIONS: None. ANESTHESIA/SEDATION: Moderate (conscious) sedation was employed during this procedure. A total of Versed 2.0 mg and Fentanyl 50 mcg was administered intravenously. Moderate Sedation Time: 10 minutes. The patient's level of consciousness and vital signs were monitored continuously by radiology nursing throughout the procedure under my direct supervision. FLUOROSCOPY TIME:  CT COMPLICATIONS: None PROCEDURE: Informed written consent was obtained from the patient after a thorough discussion of the procedural risks, benefits and alternatives. All questions were addressed. Maximal Sterile Barrier Technique was utilized including caps, mask, sterile gowns, sterile gloves, sterile drape, hand hygiene and skin antiseptic. A timeout was performed prior to the initiation of the procedure. Patient positioned right decubitus on the CT gantry table. Scout CT was obtained. Patient was then prepped and draped in the usual sterile fashion. 1% lidocaine was used for local anesthesia. Using CT guidance, 20 cm guide needle was advanced into the lymph nodes in the right retroperitoneal region overlying the psoas. Once we confirmed needle tip position, multiple 18 gauge core biopsy were acquired. These were placed into fresh specimen/saline. Patient tolerated the procedure well and remained hemodynamically stable throughout. No complications were encountered and no significant blood loss. IMPRESSION: Status post CT-guided biopsy of retroperitoneal lymphadenopathy on the right. Tissue specimen sent to pathology for complete histopathologic analysis. Signed, Dulcy Fanny.  Dellia Nims, RPVI [facility] Radiology Electronically Signed   By: Corrie Mckusick D.O.   On: 03/27/2019 11:26     Assessment and plan- Patient is a 73 y.o. male with incidentally found intra-abdominal adenopathy.  Pathology shows small lymphocytic lymphoma.  He is here to discuss further management  Patient has a normal CBC with no peripheral blood lymphocytosis, no anemia or thrombocytopenia.  He was incidentally found to have intra-abdominal adenopathy which was consistent with SLL on biopsy.  Patient does not have any clinical significant splenomegaly.  No splenomegaly seen on imaging.  He does not have any peripheral palpable bulky adenopathy.  He does not require any treatment for his CLL/SLL at this time.  I explained to him what CLL/SLL is and criteria for treatment which include all but not limited to worsening the symptoms such as worsening fatigue, unintentional weight loss or drenching night sweats, palpable bulky adenopathy, significant splenomegaly and/or worsening cytopenias and rapidly increasing lymphocyte count.  Patient does not have any of these at this time and therefore does not require any treatment for this.  Repeat CBC with differential and CMP in 3 months in 6 months and I will see him back in 6 months   Visit Diagnosis 1. Small B-cell lymphoma of intra-abdominal lymph nodes ([place])      Dr. Randa Evens, MD, MPH [facility] [phone] 04/06/2019 10:34 AM

## 2019-04-08 DIAGNOSIS — J449 Chronic obstructive pulmonary disease, unspecified: Secondary | ICD-10-CM | POA: Diagnosis not present

## 2019-04-09 ENCOUNTER — Other Ambulatory Visit: Payer: Medicare PPO

## 2019-04-09 NOTE — Progress Notes (Signed)
Tumor Board Documentation  [name] was presented by Janese Banks at our Tumor Board on 04/09/2019, which included representatives from medical oncology, surgical, radiology, pathology, internal medicine, pharmacy, navigation, research, palliative care.  Julian Sparks currently presents as a current patient, for [place], for new positive pathology with history of the following treatments: active survellience.  Additionally, we reviewed previous medical and familial history, history of present illness, and recent lab results along with all available histopathologic and imaging studies. The tumor board considered available treatment options and made the following recommendations: Active surveillance    The following procedures/referrals were also placed: No orders of the defined types were placed in this encounter.   Clinical Trial Status: not discussed   Staging used: AJCC Stage Group  AJCC Staging:       Group: SLL   National site-specific guidelines NCCN were discussed with respect to the case.  Tumor board is a meeting of clinicians from various specialty areas who evaluate and discuss patients for whom a multidisciplinary approach is being considered. Final determinations in the plan of care are those of the provider(s). The responsibility for follow up of recommendations given during tumor board is that of the provider.   Today's extended care, comprehensive team conference, Lenorris was not present for the discussion and was not examined.   Multidisciplinary Tumor Board is a multidisciplinary case peer review process.  Decisions discussed in the Multidisciplinary Tumor Board reflect the opinions of the specialists present at the conference without having examined the patient.  Ultimately, treatment and diagnostic decisions rest with the primary provider(s) and the patient.

## 2019-04-10 ENCOUNTER — Ambulatory Visit: Payer: Medicare PPO | Admitting: Oncology

## 2019-04-13 DIAGNOSIS — J449 Chronic obstructive pulmonary disease, unspecified: Secondary | ICD-10-CM | POA: Diagnosis not present

## 2019-04-15 DIAGNOSIS — J439 Emphysema, unspecified: Secondary | ICD-10-CM | POA: Diagnosis not present

## 2019-04-15 DIAGNOSIS — M67919 Unspecified disorder of synovium and tendon, unspecified shoulder: Secondary | ICD-10-CM | POA: Diagnosis not present

## 2019-04-15 DIAGNOSIS — M5417 Radiculopathy, lumbosacral region: Secondary | ICD-10-CM | POA: Diagnosis not present

## 2019-04-15 DIAGNOSIS — J441 Chronic obstructive pulmonary disease with (acute) exacerbation: Secondary | ICD-10-CM | POA: Diagnosis not present

## 2019-04-29 DIAGNOSIS — J441 Chronic obstructive pulmonary disease with (acute) exacerbation: Secondary | ICD-10-CM | POA: Diagnosis not present

## 2019-04-29 DIAGNOSIS — M67919 Unspecified disorder of synovium and tendon, unspecified shoulder: Secondary | ICD-10-CM | POA: Diagnosis not present

## 2019-04-29 DIAGNOSIS — Z23 Encounter for immunization: Secondary | ICD-10-CM | POA: Diagnosis not present

## 2019-04-29 DIAGNOSIS — M5417 Radiculopathy, lumbosacral region: Secondary | ICD-10-CM | POA: Diagnosis not present

## 2019-05-09 DIAGNOSIS — J449 Chronic obstructive pulmonary disease, unspecified: Secondary | ICD-10-CM | POA: Diagnosis not present

## 2019-05-14 DIAGNOSIS — J449 Chronic obstructive pulmonary disease, unspecified: Secondary | ICD-10-CM | POA: Diagnosis not present

## 2019-06-08 DIAGNOSIS — J449 Chronic obstructive pulmonary disease, unspecified: Secondary | ICD-10-CM | POA: Diagnosis not present

## 2019-06-13 DIAGNOSIS — J449 Chronic obstructive pulmonary disease, unspecified: Secondary | ICD-10-CM | POA: Diagnosis not present

## 2019-06-24 ENCOUNTER — Other Ambulatory Visit: Payer: Self-pay | Admitting: Internal Medicine

## 2019-06-24 ENCOUNTER — Other Ambulatory Visit (HOSPITAL_COMMUNITY): Payer: Self-pay | Admitting: Internal Medicine

## 2019-06-24 DIAGNOSIS — R5381 Other malaise: Secondary | ICD-10-CM | POA: Diagnosis not present

## 2019-06-24 DIAGNOSIS — M7989 Other specified soft tissue disorders: Secondary | ICD-10-CM

## 2019-06-24 DIAGNOSIS — I82409 Acute embolism and thrombosis of unspecified deep veins of unspecified lower extremity: Secondary | ICD-10-CM | POA: Diagnosis not present

## 2019-06-24 DIAGNOSIS — J439 Emphysema, unspecified: Secondary | ICD-10-CM | POA: Diagnosis not present

## 2019-06-24 DIAGNOSIS — J441 Chronic obstructive pulmonary disease with (acute) exacerbation: Secondary | ICD-10-CM | POA: Diagnosis not present

## 2019-06-24 DIAGNOSIS — M79605 Pain in left leg: Secondary | ICD-10-CM

## 2019-06-24 DIAGNOSIS — R609 Edema, unspecified: Secondary | ICD-10-CM | POA: Diagnosis not present

## 2019-06-24 DIAGNOSIS — M5417 Radiculopathy, lumbosacral region: Secondary | ICD-10-CM | POA: Diagnosis not present

## 2019-06-25 ENCOUNTER — Ambulatory Visit: Payer: Medicare PPO

## 2019-06-25 ENCOUNTER — Other Ambulatory Visit: Payer: Self-pay

## 2019-06-25 ENCOUNTER — Ambulatory Visit
Admission: RE | Admit: 2019-06-25 | Discharge: 2019-06-25 | Disposition: A | Discharge status: Home / Self Care | Payer: Medicare PPO | Source: Ambulatory Visit | Attending: Internal Medicine | Admitting: Internal Medicine

## 2019-06-25 DIAGNOSIS — M79605 Pain in left leg: Secondary | ICD-10-CM

## 2019-06-25 DIAGNOSIS — M7989 Other specified soft tissue disorders: Secondary | ICD-10-CM | POA: Insufficient documentation

## 2019-06-25 DIAGNOSIS — R591 Generalized enlarged lymph nodes: Secondary | ICD-10-CM | POA: Diagnosis not present

## 2019-06-25 DIAGNOSIS — R600 Localized edema: Secondary | ICD-10-CM | POA: Diagnosis not present

## 2019-06-25 DIAGNOSIS — R599 Enlarged lymph nodes, unspecified: Secondary | ICD-10-CM

## 2019-07-02 ENCOUNTER — Other Ambulatory Visit: Payer: Self-pay

## 2019-07-03 ENCOUNTER — Other Ambulatory Visit: Payer: Self-pay

## 2019-07-03 ENCOUNTER — Inpatient Hospital Stay: Payer: Medicare PPO | Attending: Oncology

## 2019-07-03 DIAGNOSIS — C8303 Small cell B-cell lymphoma, intra-abdominal lymph nodes: Secondary | ICD-10-CM | POA: Insufficient documentation

## 2019-07-03 LAB — COMPREHENSIVE METABOLIC PANEL
ALT: 26 U/L (ref 0–44)
AST: 23 U/L (ref 15–41)
Albumin: 4.3 g/dL (ref 3.5–5.0)
Alkaline Phosphatase: 76 U/L (ref 38–126)
Anion gap: 9 (ref 5–15)
BUN: 26 mg/dL — ABNORMAL HIGH (ref 8–23)
CO2: 26 mmol/L (ref 22–32)
Calcium: 8.9 mg/dL (ref 8.9–10.3)
Chloride: 101 mmol/L (ref 98–111)
Creatinine, Ser: 0.92 mg/dL (ref 0.61–1.24)
GFR calc Af Amer: >60 mL/min (ref >60)
GFR calc non Af Amer: >60 mL/min (ref >60)
Glucose, Bld: 195 mg/dL — ABNORMAL HIGH (ref 70–99)
Potassium: 4 mmol/L (ref 3.5–5.1)
Sodium: 136 mmol/L (ref 135–145)
Total Bilirubin: 0.6 mg/dL (ref 0.3–1.2)
Total Protein: 7.6 g/dL (ref 6.5–8.1)

## 2019-07-03 LAB — CBC WITH DIFFERENTIAL/PLATELET
Abs Immature Granulocytes: 0.06 10*3/uL (ref 0.00–0.07)
Basophils Absolute: 0 10*3/uL (ref 0.0–0.1)
Basophils Relative: 0 %
Eosinophils Absolute: 0.4 10*3/uL (ref 0.0–0.5)
Eosinophils Relative: 4 %
HCT: 46.3 % (ref 39.0–52.0)
Hemoglobin: 14.9 g/dL (ref 13.0–17.0)
Immature Granulocytes: 1 %
Lymphocytes Relative: 34 %
Lymphs Abs: 3.4 10*3/uL (ref 0.7–4.0)
MCH: 30.4 pg (ref 26.0–34.0)
MCHC: 32.2 g/dL (ref 30.0–36.0)
MCV: 94.5 fL (ref 80.0–100.0)
Monocytes Absolute: 0.6 10*3/uL (ref 0.1–1.0)
Monocytes Relative: 6 %
Neutro Abs: 5.6 10*3/uL (ref 1.7–7.7)
Neutrophils Relative %: 55 %
Platelets: 215 10*3/uL (ref 150–400)
RBC: 4.9 MIL/uL (ref 4.22–5.81)
RDW: 13.4 % (ref 11.5–15.5)
WBC: 10.1 10*3/uL (ref 4.0–10.5)
nRBC: 0 % (ref 0.0–0.2)

## 2019-07-09 DIAGNOSIS — J449 Chronic obstructive pulmonary disease, unspecified: Secondary | ICD-10-CM | POA: Diagnosis not present

## 2019-07-15 DIAGNOSIS — J449 Chronic obstructive pulmonary disease, unspecified: Secondary | ICD-10-CM | POA: Diagnosis not present

## 2019-07-17 ENCOUNTER — Other Ambulatory Visit: Payer: Self-pay

## 2019-07-17 ENCOUNTER — Encounter (INDEPENDENT_AMBULATORY_CARE_PROVIDER_SITE_OTHER): Payer: Self-pay | Admitting: Nurse Practitioner

## 2019-07-17 ENCOUNTER — Ambulatory Visit (INDEPENDENT_AMBULATORY_CARE_PROVIDER_SITE_OTHER): Payer: Medicare PPO | Admitting: Nurse Practitioner

## 2019-07-17 DIAGNOSIS — R0989 Other specified symptoms and signs involving the circulatory and respiratory systems: Secondary | ICD-10-CM

## 2019-07-17 DIAGNOSIS — R600 Localized edema: Secondary | ICD-10-CM

## 2019-07-17 DIAGNOSIS — M7989 Other specified soft tissue disorders: Secondary | ICD-10-CM

## 2019-07-17 NOTE — Patient Instructions (Signed)
Edema  Edema is when you have too much fluid in your body or under your skin. Edema may make your legs, feet, and ankles swell up. Swelling is also common in looser tissues, like around your eyes. This is a common condition. It gets more common as you get older. There are many possible causes of edema. Eating too much salt (sodium) and being on your feet or sitting for a long time can cause edema in your legs, feet, and ankles. Hot weather may make edema worse. Edema is usually painless. Your skin may look swollen or shiny. Follow these instructions at home:  Keep the swollen body part raised (elevated) above the level of your heart when you are sitting or lying down.  Do not sit still or stand for a long time.  Do not wear tight clothes. Do not wear garters on your upper legs.  Exercise your legs. This can help the swelling go down.  Wear elastic bandages or support stockings as told by your doctor.  Eat a low-salt (low-sodium) diet to reduce fluid as told by your doctor.  Depending on the cause of your swelling, you may need to limit how much fluid you drink (fluid restriction).  Take over-the-counter and prescription medicines only as told by your doctor. Contact a doctor if:  Treatment is not working.  You have heart, liver, or kidney disease and have symptoms of edema.  You have sudden and unexplained weight gain. Get help right away if:  You have shortness of breath or chest pain.  You cannot breathe when you lie down.  You have pain, redness, or warmth in the swollen areas.  You have heart, liver, or kidney disease and get edema all of a sudden.  You have a fever and your symptoms get worse all of a sudden. Summary  Edema is when you have too much fluid in your body or under your skin.  Edema may make your legs, feet, and ankles swell up. Swelling is also common in looser tissues, like around your eyes.  Raise (elevate) the swollen body part above the level of your  heart when you are sitting or lying down.  Follow your doctor's instructions about diet and how much fluid you can drink (fluid restriction). This information is not intended to replace advice given to you by your health care provider. Make sure you discuss any questions you have with your health care provider. Document Released: 01/30/2008 Document Revised: 08/16/2017 Document Reviewed: 08/31/2016 Elsevier Patient Education  2020 [employer]  

## 2019-07-19 ENCOUNTER — Encounter (INDEPENDENT_AMBULATORY_CARE_PROVIDER_SITE_OTHER): Payer: Self-pay | Admitting: Nurse Practitioner

## 2019-07-19 DIAGNOSIS — M7989 Other specified soft tissue disorders: Secondary | ICD-10-CM | POA: Insufficient documentation

## 2019-07-19 DIAGNOSIS — R0989 Other specified symptoms and signs involving the circulatory and respiratory systems: Secondary | ICD-10-CM | POA: Insufficient documentation

## 2019-07-19 DIAGNOSIS — R609 Edema, unspecified: Secondary | ICD-10-CM | POA: Insufficient documentation

## 2019-07-19 NOTE — Progress Notes (Signed)
SUBJECTIVE:  Patient ID: Julian Sparks, male    DOB: December 13, 1945, 73 y.o.   MRN: [id number] Chief Complaint  Patient presents with  . Advice Only    LE edema    HPI  Julian Sparks is a 73 y.o. male that presents today after referral from his primary doctor in regards to lower extremity edema.  The patient complains of lower extremity edema that has worsened in the last 2 months or so.  The patient also complains of having pain in his left ankle that is worse when he lays down.  He notes that the swelling tends to make it worse.  He also notes that the swelling is better in the morning when he first wakes and worse in the evening.  The patient's daughter does note that there is some extensive rubor in the lower extremities near the end of the day.  He denies any lower extremity ulcerations or wounds.  He denies any claudication.  The patient is currently on home oxygen.  Past Medical History:  Diagnosis Date  . Chronic bronchitis ([place])   . COPD (chronic obstructive pulmonary disease) ([place])   . Diabetes mellitus without complication ([place])   . Diastolic dysfunction   . Dyspnea    with exertion  . Hypertension     Past Surgical History:  Procedure Laterality Date  . CIRCUMCISION    . SHOULDER ARTHROSCOPY WITH OPEN ROTATOR CUFF REPAIR Left 06/20/2016   Procedure: SHOULDER ARTHROSCOPY WITH OPEN ROTATOR CUFF REPAIR;  Surgeon: Earnestine Leys, MD;  Location: [facility] ORS;  Service: Orthopedics;  Laterality: Left;  . UPPER GI ENDOSCOPY      Social History   Socioeconomic History  . Marital status: Widowed    Spouse name: Not on file  . Number of children: 2  . Years of education: Not on file  . Highest education level: Not on file  Occupational History  . Not on file  Social Needs  . Financial resource strain: Not hard at all  . Food insecurity    Worry: Never true    Inability: Never true  . Transportation needs    Medical: No    Non-medical: No  Tobacco Use  . Smoking status:  Former Smoker    Quit date: 03/27/2011    Years since quitting: 8.3  . Smokeless tobacco: Current User    Types: Chew  Substance and Sexual Activity  . Alcohol use: No  . Drug use: No  . Sexual activity: Not on file  Lifestyle  . Physical activity    Days per week: 0 days    Minutes per session: 0 min  . Stress: Not at all  Relationships  . Social connections    Talks on phone: More than three times a week    Gets together: More than three times a week    Attends religious service: Never    Active member of club or organization: No    Attends meetings of clubs or organizations: Never    Relationship status: Widowed  . Intimate partner violence    Fear of current or ex partner: Not on file    Emotionally abused: Not on file    Physically abused: Not on file    Forced sexual activity: Not on file  Other Topics Concern  . Not on file  Social History Narrative  . Not on file    Family History  Problem Relation Age of Onset  . Hypertension Mother  Allergies  Allergen Reactions  . Aspirin Nausea Only and Other (See Comments)    325 mg aspirin cause stomach upset  . Penicillins Rash     Review of Systems   Review of Systems: Negative Unless Checked Constitutional: [] Weight loss  [] Fever  [] Chills Cardiac: [] Chest pain   []  Atrial Fibrillation  [] Palpitations   [] Shortness of breath when laying flat   [x] Shortness of breath with exertion. [] Shortness of breath at rest Vascular:  [] Pain in legs with walking   [] Pain in legs with standing [] Pain in legs when laying flat   [] Claudication    [x] Pain in feet when laying flat    [] History of DVT   [] Phlebitis   [x] Swelling in legs   [x] Varicose veins   [] Non-healing ulcers Pulmonary:   [x] Uses home oxygen   [] Productive cough   [] Hemoptysis   [] Wheeze  [x] COPD   [] Asthma Neurologic:  [] Dizziness   [] Seizures  [] Blackouts [] History of stroke   [] History of TIA  [] Aphasia   [] Temporary Blindness   [] Weakness or numbness in arm    [x] Weakness or numbness in leg Musculoskeletal:   [] Joint swelling   [] Joint pain   [] Low back pain  []  History of Knee Replacement [x] Arthritis [] back Surgeries  []  Spinal Stenosis    Hematologic:  [] Easy bruising  [] Easy bleeding   [] Hypercoagulable state   [] Anemic Gastrointestinal:  [] Diarrhea   [] Vomiting  [] Gastroesophageal reflux/heartburn   [] Difficulty swallowing. [] Abdominal pain Genitourinary:  [] Chronic kidney disease   [] Difficult urination  [] Anuric   [] Blood in urine [] Frequent urination  [] Burning with urination   [] Hematuria Skin:  [] Rashes   [] Ulcers [] Wounds Psychological:  [] History of anxiety   []  History of major depression  []  Memory Difficulties      OBJECTIVE:   Physical Exam  BP (!) 180/67 (BP Location: Right Arm)   Pulse 94   Resp 17   Ht 5\' 6"  (1.676 m)   Wt 214 lb (97.1 kg)   BMI 34.54 kg/m   Gen: WD/WN, NAD Head: [place]/AT, No temporalis wasting.  Ear/Nose/Throat: Hearing grossly intact, nares w/o erythema or drainage Eyes: PER, EOMI, sclera nonicteric.  Neck: Supple, no masses.  No JVD.  Pulmonary:  Good air movement, no use of accessory muscles.  Home oxygen Cardiac: RRR Vascular: 2+ edema bilaterally Vessel Right Left  Radial Palpable Palpable  Dorsalis Pedis  not palpable  not palpable  Posterior Tibial  trace palpable  trace palpable   Gastrointestinal: soft, non-distended. No guarding/no peritoneal signs.  Musculoskeletal: Uses cane for ambulation  No deformity or atrophy.  Neurologic: Pain and light touch intact in extremities.  Symmetrical.  Speech is fluent. Motor exam as listed above. Psychiatric: Judgment intact, Mood & affect appropriate for pt's clinical situation. Dermatologic: No Venous rashes. No Ulcers Noted.  No changes consistent with cellulitis. Lymph : No Cervical lymphadenopathy, no lichenification or skin changes of chronic lymphedema.       ASSESSMENT AND PLAN:  1. Leg swelling I have had a long discussion with the  patient regarding swelling and why it  causes symptoms.  Patient will begin wearing graduated compression stockings class 1 (20-30 mmHg) on a daily basis a prescription was given. The patient will  beginning wearing the stockings first thing in the morning and removing them in the evening. The patient is instructed specifically not to sleep in the stockings.   In addition, behavioral modification will be initiated.  This will include frequent elevation, use of over the counter pain medications  and exercise such as walking.  I have reviewed systemic causes for chronic edema such as liver, kidney and cardiac etiologies.  The patient denies problems with these organ systems.    Consideration for a lymph pump will also be made based upon the effectiveness of conservative therapy.  This would help to improve the edema control and prevent sequela such as ulcers and infections   Patient should undergo duplex ultrasound of the venous system to ensure that DVT or reflux is not present.  The patient will follow-up with me after the ultrasound.    2. Diminished pulses in lower extremity  Recommend:  The patient has atypical pain symptoms for pure atherosclerotic disease. However, on physical exam there is evidence of mixed venous and arterial disease, given the diminished pulses and the edema associated with venous changes of the legs.  Noninvasive studies including ABI's and venous ultrasound of the legs will be obtained and the patient will follow up with me to review these studies.    The patient should continue walking and begin a more formal exercise program. The patient should continue his antiplatelet therapy and aggressive treatment of the lipid abnormalities.  The patient should begin wearing graduated compression socks 15-20 mmHg strength to control edema.    Current Outpatient Medications on File Prior to Visit  Medication Sig Dispense Refill  . ACCU-CHEK AVIVA PLUS test strip     .  Accu-Chek Softclix Lancets lancets     . albuterol (VENTOLIN HFA) 108 (90 Base) MCG/ACT inhaler Inhale 2 puffs into the lungs as needed for wheezing or shortness of breath.    Marland Kitchen aspirin 81 MG EC tablet Take 81 mg by mouth daily. Swallow whole.    Marland Kitchen atorvastatin (LIPITOR) 40 MG tablet Take 40 mg by mouth every evening.    . B Complex-C (SUPER B COMPLEX PO) Take 1 tablet by mouth daily.    . budesonide-formoterol (SYMBICORT) 160-4.5 MCG/ACT inhaler Inhale 2 puffs into the lungs 2 (two) times daily.    . clopidogrel (PLAVIX) 75 MG tablet     . furosemide (LASIX) 20 MG tablet     . gabapentin (NEURONTIN) 100 MG capsule Take 200 mg by mouth 3 (three) times daily.    Marland Kitchen glipiZIDE (GLUCOTROL) 5 MG tablet Take 5 mg by mouth 2 (two) times daily before a meal.    . losartan-hydrochlorothiazide (HYZAAR) 100-25 MG tablet Take 1 tablet by mouth daily.    . metFORMIN (GLUCOPHAGE) 500 MG tablet Take by mouth 2 (two) times daily with a meal.    . Omega-3 Fatty Acids (FISH OIL PO) Take 2 capsules by mouth 2 (two) times daily.    . traZODone (DESYREL) 50 MG tablet Take 100 mg by mouth at bedtime.    . vitamin C (ASCORBIC ACID) 500 MG tablet Take 500 mg by mouth daily.    . cyclobenzaprine (FLEXERIL) 10 MG tablet Take 10 mg by mouth at bedtime.    . fluticasone (FLONASE) 50 MCG/ACT nasal spray Place 1 spray into both nostrils 2 (two) times a day.     No current facility-administered medications on file prior to visit.     Patient Instructions  Edema  Edema is when you have too much fluid in your body or under your skin. Edema may make your legs, feet, and ankles swell up. Swelling is also common in looser tissues, like around your eyes. This is a common condition. It gets more common as you get older. There are many possible  causes of edema. Eating too much salt (sodium) and being on your feet or sitting for a long time can cause edema in your legs, feet, and ankles. Hot weather may make edema worse. Edema is  usually painless. Your skin may look swollen or shiny. Follow these instructions at home:  Keep the swollen body part raised (elevated) above the level of your heart when you are sitting or lying down.  Do not sit still or stand for a long time.  Do not wear tight clothes. Do not wear garters on your upper legs.  Exercise your legs. This can help the swelling go down.  Wear elastic bandages or support stockings as told by your doctor.  Eat a low-salt (low-sodium) diet to reduce fluid as told by your doctor.  Depending on the cause of your swelling, you may need to limit how much fluid you drink (fluid restriction).  Take over-the-counter and prescription medicines only as told by your doctor. Contact a doctor if:  Treatment is not working.  You have heart, liver, or kidney disease and have symptoms of edema.  You have sudden and unexplained weight gain. Get help right away if:  You have shortness of breath or chest pain.  You cannot breathe when you lie down.  You have pain, redness, or warmth in the swollen areas.  You have heart, liver, or kidney disease and get edema all of a sudden.  You have a fever and your symptoms get worse all of a sudden. Summary  Edema is when you have too much fluid in your body or under your skin.  Edema may make your legs, feet, and ankles swell up. Swelling is also common in looser tissues, like around your eyes.  Raise (elevate) the swollen body part above the level of your heart when you are sitting or lying down.  Follow your doctor's instructions about diet and how much fluid you can drink (fluid restriction). This information is not intended to replace advice given to you by your health care provider. Make sure you discuss any questions you have with your health care provider. Document Released: 01/30/2008 Document Revised: 08/16/2017 Document Reviewed: 08/31/2016 Elsevier Patient Education  [place].   No follow-ups on  file.   Kris Hartmann, NP  This note was completed with Sales executive.  Any errors are purely unintentional.

## 2019-08-06 ENCOUNTER — Ambulatory Visit (INDEPENDENT_AMBULATORY_CARE_PROVIDER_SITE_OTHER): Payer: Medicare PPO

## 2019-08-06 ENCOUNTER — Encounter (INDEPENDENT_AMBULATORY_CARE_PROVIDER_SITE_OTHER): Payer: Self-pay | Admitting: Nurse Practitioner

## 2019-08-06 ENCOUNTER — Ambulatory Visit (INDEPENDENT_AMBULATORY_CARE_PROVIDER_SITE_OTHER): Payer: Medicare PPO | Admitting: Nurse Practitioner

## 2019-08-06 ENCOUNTER — Other Ambulatory Visit: Payer: Self-pay

## 2019-08-06 VITALS — BP 172/51 | HR 96 | Resp 18 | Ht 66.0 in | Wt 211.0 lb

## 2019-08-06 DIAGNOSIS — R0989 Other specified symptoms and signs involving the circulatory and respiratory systems: Secondary | ICD-10-CM

## 2019-08-06 DIAGNOSIS — I890 Lymphedema, not elsewhere classified: Secondary | ICD-10-CM

## 2019-08-06 DIAGNOSIS — M7989 Other specified soft tissue disorders: Secondary | ICD-10-CM | POA: Diagnosis not present

## 2019-08-06 DIAGNOSIS — I739 Peripheral vascular disease, unspecified: Secondary | ICD-10-CM

## 2019-08-08 DIAGNOSIS — J449 Chronic obstructive pulmonary disease, unspecified: Secondary | ICD-10-CM | POA: Diagnosis not present

## 2019-08-11 ENCOUNTER — Encounter (INDEPENDENT_AMBULATORY_CARE_PROVIDER_SITE_OTHER): Payer: Self-pay | Admitting: Nurse Practitioner

## 2019-08-11 NOTE — Progress Notes (Signed)
SUBJECTIVE:  Patient ID: Julian Sparks, male    DOB: 1946/05/04, 73 y.o.   MRN: [id number] Chief Complaint  Patient presents with  . Follow-up    ultasound    HPI  Julian Sparks is a 73 y.o. male that presents today to follow-up on lower extremity swelling and pain.  The patient and daughter note that since the patient last visited our office the leg swelling seem to suddenly resolved.  They say that the patient's legs look better than they have in some time.  However, the pain continued.  The patient notes that the pain occurs in his foot and extends up to his ankle.  The pain is described as a burning stinging sensation.  He also describes it as feeling as if these are stinging in his feet constantly.  He does note that the right lower extremity is worse than the left lower extremity.  He denies any open wounds on his bilateral lower extremities.  The patient's daughter does notice some redness in the lower extremities but denies any cyanotic or gangrenous changes.  The patient is currently on home oxygen.  He denies any claudication-like symptoms rest pain like symptoms.  He denies any TIA-like symptoms or amaurosis fugax.  The patient underwent ABI which revealed an ABI 0.99 on the right and 0.80 on the left.  The patient had biphasic/monophasic waveforms in the right tibial arteries with strong monophasic waveforms in the left lower extremity with strong toe waveforms on the right with dampened toe waveforms on the left.  The patient also underwent a bilateral lower extremity venous reflux which revealed no evidence of DVT, no evidence of superficial venous thrombosis or chronic venous insufficiency.  Past Medical History:  Diagnosis Date  . Chronic bronchitis ([place])   . COPD (chronic obstructive pulmonary disease) ([place])   . Diabetes mellitus without complication ([place])   . Diastolic dysfunction   . Dyspnea    with exertion  . Hypertension     Past Surgical History:  Procedure  Laterality Date  . CIRCUMCISION    . SHOULDER ARTHROSCOPY WITH OPEN ROTATOR CUFF REPAIR Left 06/20/2016   Procedure: SHOULDER ARTHROSCOPY WITH OPEN ROTATOR CUFF REPAIR;  Surgeon: Earnestine Leys, MD;  Location: [facility] ORS;  Service: Orthopedics;  Laterality: Left;  . UPPER GI ENDOSCOPY      Social History   Socioeconomic History  . Marital status: Widowed    Spouse name: Not on file  . Number of children: 2  . Years of education: Not on file  . Highest education level: Not on file  Occupational History  . Not on file  Tobacco Use  . Smoking status: Former Smoker    Quit date: 03/27/2011    Years since quitting: 8.3  . Smokeless tobacco: Current User    Types: Chew  Substance and Sexual Activity  . Alcohol use: No  . Drug use: No  . Sexual activity: Not on file  Other Topics Concern  . Not on file  Social History Narrative  . Not on file   Social Determinants of Health   Financial Resource Strain: Low Risk   . Difficulty of Paying Living Expenses: Not hard at all  Food Insecurity: No Food Insecurity  . Worried About Charity fundraiser in the Last Year: Never true  . Ran Out of Food in the Last Year: Never true  Transportation Needs: No Transportation Needs  . Lack of Transportation (Medical): No  . Lack of Transportation (Non-Medical):  No  Physical Activity: Inactive  . Days of Exercise per Week: 0 days  . Minutes of Exercise per Session: 0 min  Stress: No Stress Concern Present  . Feeling of Stress : Not at all  Social Connections: Moderately Isolated  . Frequency of Communication with Friends and Family: More than three times a week  . Frequency of Social Gatherings with Friends and Family: More than three times a week  . Attends Religious Services: Never  . Active Member of Clubs or Organizations: No  . Attends Archivist Meetings: Never  . Marital Status: Widowed  Intimate Partner Violence:   . Fear of Current or Ex-Partner: Not on file  . Emotionally  Abused: Not on file  . Physically Abused: Not on file  . Sexually Abused: Not on file    Family History  Problem Relation Age of Onset  . Hypertension Mother     Allergies  Allergen Reactions  . Aspirin Nausea Only and Other (See Comments)    325 mg aspirin cause stomach upset  . Penicillins Rash     Review of Systems   Review of Systems: Negative Unless Checked Constitutional: [] Weight loss  [] Fever  [] Chills Cardiac: [] Chest pain   []  Atrial Fibrillation  [] Palpitations   [] Shortness of breath when laying flat   [x] Shortness of breath with exertion. [] Shortness of breath at rest Vascular:  [] Pain in legs with walking   [] Pain in legs with standing [] Pain in legs when laying flat   [] Claudication    [] Pain in feet when laying flat    [] History of DVT   [] Phlebitis   [x] Swelling in legs   [x] Varicose veins   [] Non-healing ulcers Pulmonary:   [x] Uses home oxygen   [] Productive cough   [] Hemoptysis   [] Wheeze  [x] COPD   [] Asthma Neurologic:  [] Dizziness   [] Seizures  [] Blackouts [] History of stroke   [] History of TIA  [] Aphasia   [] Temporary Blindness   [] Weakness or numbness in arm   [] Weakness or numbness in leg Musculoskeletal:   [] Joint swelling   [] Joint pain   [] Low back pain  []  History of Knee Replacement [x] Arthritis [] back Surgeries  []  Spinal Stenosis    Hematologic:  [] Easy bruising  [] Easy bleeding   [] Hypercoagulable state   [] Anemic Gastrointestinal:  [] Diarrhea   [] Vomiting  [] Gastroesophageal reflux/heartburn   [] Difficulty swallowing. [] Abdominal pain Genitourinary:  [] Chronic kidney disease   [] Difficult urination  [] Anuric   [] Blood in urine [] Frequent urination  [] Burning with urination   [] Hematuria Skin:  [] Rashes   [] Ulcers [] Wounds Psychological:  [] History of anxiety   []  History of major depression  []  Memory Difficulties      OBJECTIVE:   Physical Exam  BP (!) 172/51 (BP Location: Right Arm)   Pulse 96   Resp 18   Ht 5\' 6"  (1.676 m)   Wt 211 lb  (95.7 kg)   BMI 34.06 kg/m   Gen: WD/WN, NAD Head: [place]/AT, No temporalis wasting.  Ear/Nose/Throat: Hearing grossly intact, nares w/o erythema or drainage Eyes: PER, EOMI, sclera nonicteric.  Neck: Supple, no masses.  No JVD.  Pulmonary:  Good air movement, no use of accessory muscles.  Cardiac: RRR Vascular: minimal edema bilaterally Vessel Right Left  Radial Palpable Palpable  Dorsalis Pedis Not Palpable Nit Palpable  Posterior Tibial Trace Palpable tracePalpable   Gastrointestinal: soft, non-distended. No guarding/no peritoneal signs.  Musculoskeletal: M/S 5/5 throughout.  No deformity or atrophy.  Neurologic: Pain and light touch intact in extremities.  Symmetrical.  Speech is fluent. Motor exam as listed above. Psychiatric: Judgment intact, Mood & affect appropriate for pt's clinical situation. Dermatologic: No Venous rashes. No Ulcers Noted.  No changes consistent with cellulitis. Lymph : No Cervical lymphadenopathy, no lichenification or skin changes of chronic lymphedema.       ASSESSMENT AND PLAN:  1. PAD (peripheral artery disease) ([place]) Based upon the patient's description of pain it is likely that it is more so related to neuropathy versus PAD.  However, the patient does have some evidence of peripheral artery disease that should be followed.  At this time he does not have any critical limb threatening situations such as rest pain or ulceration.  We will have the patient follow-up with noninvasive studies in 3 months. - VAS [place] ABI WITH/WO TBI; Future - VAS [place] LOWER EXTREMITY ARTERIAL DUPLEX; Future  2. Lymphedema No surgery or intervention at this point in time.  I have reviewed my discussion with the patient regarding venous insufficiency and why it causes symptoms. I have discussed with the patient the chronic skin changes that accompany venous insufficiency and the long term sequela such as ulceration. Patient will contnue wearing graduated compression stockings on a  daily basis, as this has provided excellent control of his edema. The patient will put the stockings on first thing in the morning and removing them in the evening. The patient is reminded not to sleep in the stockings.  In addition, behavioral modification including elevation during the day will be initiated. Exercise is strongly encouraged.    Given the patient's good control and lack of any problems regarding the venous insufficiency and lymphedema a lymph pump in not need at this time.  The patient will follow up with me PRN should anything change.  The patient voices agreement with this plan.     Current Outpatient Medications on File Prior to Visit  Medication Sig Dispense Refill  . ACCU-CHEK AVIVA PLUS test strip     . Accu-Chek Softclix Lancets lancets     . albuterol (VENTOLIN HFA) 108 (90 Base) MCG/ACT inhaler Inhale 2 puffs into the lungs as needed for wheezing or shortness of breath.    Marland Kitchen aspirin 81 MG EC tablet Take 81 mg by mouth daily. Swallow whole.    Marland Kitchen atorvastatin (LIPITOR) 40 MG tablet Take 40 mg by mouth every evening.    . B Complex-C (SUPER B COMPLEX PO) Take 1 tablet by mouth daily.    . budesonide-formoterol (SYMBICORT) 160-4.5 MCG/ACT inhaler Inhale 2 puffs into the lungs 2 (two) times daily.    . clopidogrel (PLAVIX) 75 MG tablet     . cyclobenzaprine (FLEXERIL) 10 MG tablet Take 10 mg by mouth at bedtime.    . fluticasone (FLONASE) 50 MCG/ACT nasal spray Place 1 spray into both nostrils 2 (two) times a day.    . furosemide (LASIX) 20 MG tablet     . gabapentin (NEURONTIN) 100 MG capsule Take 200 mg by mouth 3 (three) times daily.    Marland Kitchen glipiZIDE (GLUCOTROL) 5 MG tablet Take 5 mg by mouth 2 (two) times daily before a meal.    . losartan-hydrochlorothiazide (HYZAAR) 100-25 MG tablet Take 1 tablet by mouth daily.    . metFORMIN (GLUCOPHAGE) 500 MG tablet Take by mouth 2 (two) times daily with a meal.    . Omega-3 Fatty Acids (FISH OIL PO) Take 2 capsules by mouth  2 (two) times daily.    . traZODone (DESYREL) 50 MG tablet Take 100  mg by mouth at bedtime.    . vitamin C (ASCORBIC ACID) 500 MG tablet Take 500 mg by mouth daily.     No current facility-administered medications on file prior to visit.    There are no Patient Instructions on file for this visit. No follow-ups on file.   Kris Hartmann, NP  This note was completed with Sales executive.  Any errors are purely unintentional.

## 2019-08-13 DIAGNOSIS — I739 Peripheral vascular disease, unspecified: Secondary | ICD-10-CM | POA: Diagnosis not present

## 2019-08-13 DIAGNOSIS — I119 Hypertensive heart disease without heart failure: Secondary | ICD-10-CM | POA: Diagnosis not present

## 2019-08-13 DIAGNOSIS — M67919 Unspecified disorder of synovium and tendon, unspecified shoulder: Secondary | ICD-10-CM | POA: Diagnosis not present

## 2019-08-13 DIAGNOSIS — J441 Chronic obstructive pulmonary disease with (acute) exacerbation: Secondary | ICD-10-CM | POA: Diagnosis not present

## 2019-09-08 DIAGNOSIS — J449 Chronic obstructive pulmonary disease, unspecified: Secondary | ICD-10-CM | POA: Diagnosis not present

## 2019-10-05 ENCOUNTER — Other Ambulatory Visit: Payer: Medicare PPO

## 2019-10-05 ENCOUNTER — Ambulatory Visit: Payer: Medicare PPO | Admitting: Oncology

## 2019-10-09 ENCOUNTER — Inpatient Hospital Stay: Payer: Medicare HMO

## 2019-10-09 ENCOUNTER — Inpatient Hospital Stay: Payer: Medicare HMO | Admitting: Oncology

## 2019-10-09 DIAGNOSIS — J449 Chronic obstructive pulmonary disease, unspecified: Secondary | ICD-10-CM | POA: Diagnosis not present

## 2019-10-15 DIAGNOSIS — J449 Chronic obstructive pulmonary disease, unspecified: Secondary | ICD-10-CM | POA: Diagnosis not present

## 2019-11-06 ENCOUNTER — Ambulatory Visit (INDEPENDENT_AMBULATORY_CARE_PROVIDER_SITE_OTHER): Payer: Medicare PPO | Admitting: Vascular Surgery

## 2019-11-06 ENCOUNTER — Encounter (INDEPENDENT_AMBULATORY_CARE_PROVIDER_SITE_OTHER): Payer: Medicare HMO

## 2019-11-06 DIAGNOSIS — J449 Chronic obstructive pulmonary disease, unspecified: Secondary | ICD-10-CM | POA: Diagnosis not present

## 2019-11-12 DIAGNOSIS — J449 Chronic obstructive pulmonary disease, unspecified: Secondary | ICD-10-CM | POA: Diagnosis not present

## 2019-11-23 ENCOUNTER — Ambulatory Visit: Payer: Medicare HMO | Attending: Internal Medicine

## 2019-11-23 DIAGNOSIS — Z23 Encounter for immunization: Secondary | ICD-10-CM

## 2019-11-23 NOTE — Progress Notes (Signed)
   Covid-19 Vaccination Clinic  Name:  Julian Sparks    MRN: [id number] DOB: [birth year]  11/23/2019  Julian Sparks was observed post Covid-19 immunization for 15 minutes without incident. He was provided with Vaccine Information Sheet and instruction to access the V-Safe system.   Julian Sparks was instructed to call 911 with any severe reactions post vaccine: Marland Kitchen Difficulty breathing  . Swelling of face and throat  . A fast heartbeat  . A bad rash all over body  . Dizziness and weakness   Immunizations Administered    Name Date Dose VIS Date Route   Pfizer COVID-19 Vaccine 11/23/2019  9:31 AM 0.3 mL 08/07/2019 Intramuscular   Manufacturer: [place]   Lot: [id number]   [place]: [address]-9

## 2019-12-07 DIAGNOSIS — J449 Chronic obstructive pulmonary disease, unspecified: Secondary | ICD-10-CM | POA: Diagnosis not present

## 2019-12-16 ENCOUNTER — Ambulatory Visit: Payer: Medicare HMO | Attending: Internal Medicine

## 2019-12-16 DIAGNOSIS — Z23 Encounter for immunization: Secondary | ICD-10-CM

## 2019-12-16 NOTE — Progress Notes (Signed)
   Covid-19 Vaccination Clinic  Name:  Julian Sparks    MRN: [id number] DOB: Mar 22, 1946  12/16/2019  Julian Sparks was observed post Covid-19 immunization for 15 minutes without incident. He was provided with Vaccine Information Sheet and instruction to access the V-Safe system.   Julian Sparks was instructed to call 911 with any severe reactions post vaccine: Marland Kitchen Difficulty breathing  . Swelling of face and throat  . A fast heartbeat  . A bad rash all over body  . Dizziness and weakness   Immunizations Administered    Name Date Dose VIS Date Route   Pfizer COVID-19 Vaccine 12/16/2019 11:42 AM 0.3 mL 10/21/2018 Intramuscular   Manufacturer: [place]   Lot: [id number]   [place]: [address]-5

## 2020-01-04 ENCOUNTER — Other Ambulatory Visit: Payer: Self-pay | Admitting: Internal Medicine

## 2020-01-07 DIAGNOSIS — I110 Hypertensive heart disease with heart failure: Secondary | ICD-10-CM | POA: Diagnosis not present

## 2020-01-07 DIAGNOSIS — J449 Chronic obstructive pulmonary disease, unspecified: Secondary | ICD-10-CM | POA: Diagnosis not present

## 2020-01-07 DIAGNOSIS — E261 Secondary hyperaldosteronism: Secondary | ICD-10-CM | POA: Diagnosis not present

## 2020-01-07 DIAGNOSIS — I509 Heart failure, unspecified: Secondary | ICD-10-CM | POA: Diagnosis not present

## 2020-01-07 DIAGNOSIS — Z6836 Body mass index (BMI) 36.0-36.9, adult: Secondary | ICD-10-CM | POA: Diagnosis not present

## 2020-01-07 DIAGNOSIS — Z87891 Personal history of nicotine dependence: Secondary | ICD-10-CM | POA: Diagnosis not present

## 2020-01-07 DIAGNOSIS — E1151 Type 2 diabetes mellitus with diabetic peripheral angiopathy without gangrene: Secondary | ICD-10-CM | POA: Diagnosis not present

## 2020-01-15 ENCOUNTER — Telehealth: Payer: Self-pay | Admitting: Internal Medicine

## 2020-01-15 MED ORDER — GABAPENTIN 100 MG PO CAPS: 200.0000 mg | ORAL_CAPSULE | Freq: Three times a day (TID) | ORAL | 3 refills | Status: DC

## 2020-01-15 NOTE — Telephone Encounter (Signed)
Rx sent to pharmacy   

## 2020-01-15 NOTE — Telephone Encounter (Signed)
Patient needs a refill of gabapentin called into [facility] in [place].

## 2020-01-26 DIAGNOSIS — J449 Chronic obstructive pulmonary disease, unspecified: Secondary | ICD-10-CM | POA: Diagnosis not present

## 2020-01-26 DIAGNOSIS — Z87891 Personal history of nicotine dependence: Secondary | ICD-10-CM | POA: Diagnosis not present

## 2020-01-26 DIAGNOSIS — I110 Hypertensive heart disease with heart failure: Secondary | ICD-10-CM | POA: Diagnosis not present

## 2020-01-26 DIAGNOSIS — I509 Heart failure, unspecified: Secondary | ICD-10-CM | POA: Diagnosis not present

## 2020-01-26 DIAGNOSIS — E261 Secondary hyperaldosteronism: Secondary | ICD-10-CM | POA: Diagnosis not present

## 2020-02-17 ENCOUNTER — Ambulatory Visit (INDEPENDENT_AMBULATORY_CARE_PROVIDER_SITE_OTHER): Payer: Medicare HMO | Admitting: Internal Medicine

## 2020-02-17 ENCOUNTER — Encounter: Payer: Self-pay | Admitting: Internal Medicine

## 2020-02-17 ENCOUNTER — Other Ambulatory Visit: Payer: Self-pay

## 2020-02-17 VITALS — BP 142/82 | HR 82 | Ht 71.0 in | Wt 222.9 lb

## 2020-02-17 DIAGNOSIS — E139 Other specified diabetes mellitus without complications: Secondary | ICD-10-CM | POA: Insufficient documentation

## 2020-02-17 DIAGNOSIS — E669 Obesity, unspecified: Secondary | ICD-10-CM | POA: Diagnosis not present

## 2020-02-17 DIAGNOSIS — I5082 Biventricular heart failure: Secondary | ICD-10-CM

## 2020-02-17 DIAGNOSIS — M7502 Adhesive capsulitis of left shoulder: Secondary | ICD-10-CM | POA: Diagnosis not present

## 2020-02-17 DIAGNOSIS — J9611 Chronic respiratory failure with hypoxia: Secondary | ICD-10-CM

## 2020-02-17 DIAGNOSIS — M7501 Adhesive capsulitis of right shoulder: Secondary | ICD-10-CM | POA: Diagnosis not present

## 2020-02-17 MED ORDER — ALBUTEROL SULFATE HFA 108 (90 BASE) MCG/ACT IN AERS: 2.0000 | INHALATION_SPRAY | RESPIRATORY_TRACT | 6 refills | Status: DC | PRN

## 2020-02-17 NOTE — Assessment & Plan Note (Signed)
-   I encouraged the patient to lose weight.  - I educated them on making healthy dietary choices including eating more fruits and vegetables and less fried foods. - I encouraged the patient to exercise more, and educated on the benefits of exercise including weight loss, diabetes management, and hypertension management.

## 2020-02-17 NOTE — Progress Notes (Signed)
Established Patient Office Visit  SUBJECTIVE:  Subjective  Patient ID: Julian Sparks, male    DOB: Apr 26, 1946  Age: 74 y.o. MRN: [id number]  CC:  Chief Complaint  Patient presents with  . Diabetes    patient checked blood sugar from home and it was 100  . Hypertension  . Shoulder Pain    bilateral     HPI Julian Sparks is a 74 y.o. male presenting today for follow-up of HTN and blood sugar levels.  Today he reports feeling fine. He is still on a nasal cannula, at 2L, and he sleeps with it. He reports pain in both shoulders. He takes trazodone and gabapentin for his shoulder pain, but it still does not help him sleep. He measured his blood sugar this morning and it was 100.  He is fully compliant with his medications. He is fully vaccinated against COVID. He has quit smoking.  Past Medical History:  Diagnosis Date  . Chronic bronchitis ([place])   . COPD (chronic obstructive pulmonary disease) ([place])   . Diabetes mellitus without complication ([place])   . Diastolic dysfunction   . Dyspnea    with exertion  . Hypertension     Past Surgical History:  Procedure Laterality Date  . CIRCUMCISION    . SHOULDER ARTHROSCOPY WITH OPEN ROTATOR CUFF REPAIR Left 06/20/2016   Procedure: SHOULDER ARTHROSCOPY WITH OPEN ROTATOR CUFF REPAIR;  Surgeon: Earnestine Leys, MD;  Location: [facility] ORS;  Service: Orthopedics;  Laterality: Left;  . UPPER GI ENDOSCOPY      Family History  Problem Relation Age of Onset  . Hypertension Mother     Social History   Socioeconomic History  . Marital status: Widowed    Spouse name: Not on file  . Number of children: 2  . Years of education: Not on file  . Highest education level: Not on file  Occupational History  . Not on file  Tobacco Use  . Smoking status: Former Smoker    Quit date: 03/27/2011    Years since quitting: 8.9  . Smokeless tobacco: Current User    Types: Chew  Substance and Sexual Activity  . Alcohol use: No  . Drug use: No  .  Sexual activity: Not on file  Other Topics Concern  . Not on file  Social History Narrative  . Not on file   Social Determinants of Health   Financial Resource Strain: Low Risk   . Difficulty of Paying Living Expenses: Not hard at all  Food Insecurity: No Food Insecurity  . Worried About Charity fundraiser in the Last Year: Never true  . Ran Out of Food in the Last Year: Never true  Transportation Needs: No Transportation Needs  . Lack of Transportation (Medical): No  . Lack of Transportation (Non-Medical): No  Physical Activity: Inactive  . Days of Exercise per Week: 0 days  . Minutes of Exercise per Session: 0 min  Stress: No Stress Concern Present  . Feeling of Stress : Not at all  Social Connections: Socially Isolated  . Frequency of Communication with Friends and Family: More than three times a week  . Frequency of Social Gatherings with Friends and Family: More than three times a week  . Attends Religious Services: Never  . Active Member of Clubs or Organizations: No  . Attends Archivist Meetings: Never  . Marital Status: Widowed  Intimate Partner Violence:   . Fear of Current or Ex-Partner:   . Emotionally Abused:   .  Physically Abused:   . Sexually Abused:      Current Outpatient Medications:  .  ACCU-CHEK AVIVA PLUS test strip, , Disp: , Rfl:  .  Accu-Chek Softclix Lancets lancets, , Disp: , Rfl:  .  albuterol (VENTOLIN HFA) 108 (90 Base) MCG/ACT inhaler, Inhale 2 puffs into the lungs as needed for wheezing or shortness of breath., Disp: 18 g, Rfl: 6 .  aspirin 81 MG EC tablet, Take 81 mg by mouth daily. Swallow whole. , Disp: , Rfl:  .  atorvastatin (LIPITOR) 40 MG tablet, Take 40 mg by mouth every evening. , Disp: , Rfl:  .  B Complex-C (SUPER B COMPLEX PO), Take 1 tablet by mouth daily. , Disp: , Rfl:  .  cyclobenzaprine (FLEXERIL) 10 MG tablet, Take 10 mg by mouth at bedtime. , Disp: , Rfl:  .  fluticasone (FLONASE) 50 MCG/ACT nasal spray, Place 1  spray into both nostrils 2 (two) times a day. , Disp: , Rfl:  .  furosemide (LASIX) 20 MG tablet, , Disp: , Rfl:  .  gabapentin (NEURONTIN) 100 MG capsule, Take 2 capsules (200 mg total) by mouth 3 (three) times daily., Disp: 90 capsule, Rfl: 3 .  glipiZIDE (GLUCOTROL) 5 MG tablet, Take 5 mg by mouth 2 (two) times daily before a meal. , Disp: , Rfl:  .  losartan-hydrochlorothiazide (HYZAAR) 100-25 MG tablet, Take 1 tablet by mouth daily. , Disp: , Rfl:  .  metFORMIN (GLUCOPHAGE) 500 MG tablet, Take by mouth 2 (two) times daily with a meal. , Disp: , Rfl:  .  Omega-3 Fatty Acids (FISH OIL PO), Take 2 capsules by mouth 2 (two) times daily. , Disp: , Rfl:  .  SYMBICORT 160-4.5 MCG/ACT inhaler, INHALE 2 PUFFS TWICE DAILY, Disp: 1 Inhaler, Rfl: 6 .  traZODone (DESYREL) 50 MG tablet, Take 100 mg by mouth at bedtime. , Disp: , Rfl:  .  vitamin C (ASCORBIC ACID) 500 MG tablet, Take 500 mg by mouth daily., Disp: , Rfl:    Allergies  Allergen Reactions  . Aspirin Nausea Only and Other (See Comments)    325 mg aspirin cause stomach upset  . Penicillins Rash    ROS Review of Systems  Constitutional: Negative.   HENT: Negative.   Eyes: Negative.   Respiratory: Positive for shortness of breath (on [place] at 2L).   Cardiovascular: Negative.   Gastrointestinal: Negative.   Endocrine: Negative.   Genitourinary: Negative.   Musculoskeletal: Positive for arthralgias (bilat shoulders).  Skin: Negative.   Allergic/Immunologic: Negative.   Neurological: Negative.   Hematological: Negative.   Psychiatric/Behavioral: Negative.   All other systems reviewed and are negative.    OBJECTIVE:    Physical Exam Vitals reviewed.  Constitutional:      Appearance: Normal appearance. He is obese.     Interventions: Nasal cannula in place.  Neck:     Vascular: No carotid bruit.  Cardiovascular:     Rate and Rhythm: Normal rate and regular rhythm.     Pulses: Normal pulses.     Heart sounds: Normal heart  sounds.  Pulmonary:     Effort: Pulmonary effort is normal.     Breath sounds: Normal breath sounds.  Abdominal:     General: Bowel sounds are normal.     Palpations: Abdomen is soft. There is no hepatomegaly or splenomegaly.     Tenderness: There is no abdominal tenderness.     Hernia: No hernia is present.  Musculoskeletal:  Right shoulder: Decreased range of motion.     Left shoulder: Decreased range of motion.     Right lower leg: No edema.     Left lower leg: No edema.  Skin:    Findings: No rash.  Neurological:     General: No focal deficit present.     Mental Status: He is alert and oriented to person, place, and time.  Psychiatric:        Mood and Affect: Mood normal.        Behavior: Behavior normal.     BP (!) 142/82   Pulse 82   Ht 5\' 11"  (1.803 m)   Wt 222 lb 14.4 oz (101.1 kg)   SpO2 91% Comment: 2 liters  BMI 31.09 kg/m  Wt Readings from Last 3 Encounters:  02/17/20 222 lb 14.4 oz (101.1 kg)  08/06/19 211 lb (95.7 kg)  07/17/19 214 lb (97.1 kg)    Health Maintenance Due  Topic Date Due  . Hepatitis C Screening  Never done  . FOOT EXAM  Never done  . OPHTHALMOLOGY EXAM  Never done  . TETANUS/TDAP  Never done  . COLONOSCOPY  Never done  . HEMOGLOBIN A1C  08/07/2019    There are no preventive care reminders to display for this patient.  CBC Latest Ref Rng & Units 07/03/2019 03/27/2019 02/05/2019  WBC 4.0 - 10.5 K/uL 10.1 10.4 12.8(H)  Hemoglobin 13.0 - 17.0 g/dL 14.9 15.1 16.1  Hematocrit 39 - 52 % 46.3 46.6 48.5  Platelets 150 - 400 K/uL 215 209 224   CMP Latest Ref Rng & Units 07/03/2019 03/27/2019 02/06/2019  Glucose 70 - 99 mg/dL 195(H) 156(H) 201(H)  BUN 8 - 23 mg/dL 26(H) 28(H) 31(H)  Creatinine 0.61 - 1.24 mg/dL 0.92 0.82 0.89  Sodium 135 - 145 mmol/L 136 143 135  Potassium 3.5 - 5.1 mmol/L 4.0 4.2 3.9  Chloride 98 - 111 mmol/L 101 106 100  CO2 22 - 32 mmol/L 26 27 28   Calcium 8.9 - 10.3 mg/dL 8.9 9.0 8.6(L)  Total Protein 6.5 - 8.1 g/dL  7.6 - -  Total Bilirubin 0.3 - 1.2 mg/dL 0.6 - -  Alkaline Phos 38 - 126 U/L 76 - -  AST 15 - 41 U/L 23 - -  ALT 0 - 44 U/L 26 - -    No results found for: TSH Lab Results  Component Value Date   ALBUMIN 4.3 07/03/2019   ANIONGAP 9 07/03/2019   No results found for: CHOL, HDL, LDLCALC, CHOLHDL No results found for: TRIG Lab Results  Component Value Date   HGBA1C 7.3 (H) 02/05/2019      ASSESSMENT & PLAN:   Problem List Items Addressed This Visit      Cardiovascular and Mediastinum   Biventricular congestive heart failure (HCC) - Primary    Congestive heart failure is stable at the present time patient chest is clear is not having any wheezing.        Respiratory   Respiratory failure ([place])    Patient is on home oxygen therapy 2 L/min.  On physical examination he was not found to have any wheezing or rales.  Patient not smoking anymore.  He has been advised to lose weight.        Endocrine   Diabetes 1.5, managed as type 2 ([place])    - The patient's blood sugar is under control on metformin and glipizide. - The patient will continue the current treatment regimen.  - I  encouraged the patient to regularly check blood sugar.  - I encouraged the patient to monitor diet. I encouraged the patient to eat low-carb and low-sugar to help prevent blood sugar spikes.  - I encouraged the patient to continue following their prescribed treatment plan for diabetes - I informed the patient to get help if blood sugar drops below 54mg /dL, or if suddenly have trouble thinking clearly or breathing.           Musculoskeletal and Integument   Adhesive capsulitis of both shoulders    Patient has limited movement of the both shoulder consistent with frozen shoulder I offered him to give cortisone  shot but he said he will come back for that if it gets any worse.        Other   Obesity (BMI 30.0-34.9)    - I encouraged the patient to lose weight.  - I educated them on making healthy  dietary choices including eating more fruits and vegetables and less fried foods. - I encouraged the patient to exercise more, and educated on the benefits of exercise including weight loss, diabetes management, and hypertension management.          Meds ordered this encounter  Medications  . albuterol (VENTOLIN HFA) 108 (90 Base) MCG/ACT inhaler    Sig: Inhale 2 puffs into the lungs as needed for wheezing or shortness of breath.    Dispense:  18 g    Refill:  6    1. Biventricular congestive heart failure (HCC)   2. Obesity (BMI 30.0-34.9)   3. Chronic respiratory failure with hypoxia (HCC)   4. Diabetes 1.5, managed as type 2 ([place])   5. Adhesive capsulitis of both shoulders   Follow-up: Return in about 2 months (around 04/18/2020).    Dr. Jane Canary [facility] [address], [place], [place] [address]   By signing my name below, I, Milinda Antis, attest that this documentation has been prepared under the direction and in the presence of [name], MD. Electronically Signed: [name], MD 02/17/20, 9:52 AM   I personally performed the services described in this documentation, which was SCRIBED in my presence. The recorded information has been reviewed and considered accurate. It has been edited as necessary during review. [name], MD

## 2020-02-17 NOTE — Assessment & Plan Note (Signed)
Patient has limited movement of the both shoulder consistent with frozen shoulder I offered him to give cortisone  shot but he said he will come back for that if it gets any worse.

## 2020-02-17 NOTE — Assessment & Plan Note (Signed)
Congestive heart failure is stable at the present time patient chest is clear is not having any wheezing.

## 2020-02-17 NOTE — Assessment & Plan Note (Signed)
-   The patient's blood sugar is under control on metformin and glipizide. - The patient will continue the current treatment regimen.  - I encouraged the patient to regularly check blood sugar.  - I encouraged the patient to monitor diet. I encouraged the patient to eat low-carb and low-sugar to help prevent blood sugar spikes.  - I encouraged the patient to continue following their prescribed treatment plan for diabetes - I informed the patient to get help if blood sugar drops below 54mg /dL, or if suddenly have trouble thinking clearly or breathing.

## 2020-02-17 NOTE — Assessment & Plan Note (Signed)
Patient is on home oxygen therapy 2 L/min.  On physical examination he was not found to have any wheezing or rales.  Patient not smoking anymore.  He has been advised to lose weight.

## 2020-03-10 ENCOUNTER — Other Ambulatory Visit: Payer: Self-pay | Admitting: *Deleted

## 2020-03-10 MED ORDER — ALBUTEROL SULFATE HFA 108 (90 BASE) MCG/ACT IN AERS: 2.0000 | INHALATION_SPRAY | RESPIRATORY_TRACT | 3 refills | Status: DC | PRN

## 2020-03-10 MED ORDER — ACCU-CHEK AVIVA PLUS VI STRP: ORAL_STRIP | 3 refills | Status: DC

## 2020-03-10 MED ORDER — GABAPENTIN 100 MG PO CAPS: 200.0000 mg | ORAL_CAPSULE | Freq: Three times a day (TID) | ORAL | 3 refills | Status: DC

## 2020-03-10 MED ORDER — BD SWAB SINGLE USE REGULAR PADS: MEDICATED_PAD | 3 refills | Status: DC

## 2020-04-13 ENCOUNTER — Ambulatory Visit: Payer: Medicare HMO | Admitting: Internal Medicine

## 2020-04-18 ENCOUNTER — Other Ambulatory Visit: Payer: Self-pay

## 2020-04-18 MED ORDER — TRAMADOL HCL 50 MG PO TABS: 50.0000 mg | ORAL_TABLET | Freq: Two times a day (BID) | ORAL | 0 refills | Status: AC

## 2020-04-18 NOTE — Telephone Encounter (Signed)
Patient called states he had a recent fall and believes he has broken a rib. Patient was advised to go to [facility] or the emergengcy room, however patient declined stating he was "charged a large amount of money the last time he went and does not want to go back". Dr. Lavera Guise was informed and gave verbal order to send a rx in for Tramadol 50 mg, BID.

## 2020-04-20 ENCOUNTER — Other Ambulatory Visit: Payer: Self-pay | Admitting: Internal Medicine

## 2020-04-27 ENCOUNTER — Encounter: Payer: Self-pay | Admitting: Internal Medicine

## 2020-04-27 ENCOUNTER — Ambulatory Visit (INDEPENDENT_AMBULATORY_CARE_PROVIDER_SITE_OTHER): Payer: Medicare HMO | Admitting: Internal Medicine

## 2020-04-27 ENCOUNTER — Other Ambulatory Visit: Payer: Self-pay

## 2020-04-27 VITALS — BP 157/89 | HR 101 | Ht 66.0 in | Wt 220.6 lb

## 2020-04-27 DIAGNOSIS — E669 Obesity, unspecified: Secondary | ICD-10-CM

## 2020-04-27 DIAGNOSIS — M7552 Bursitis of left shoulder: Secondary | ICD-10-CM | POA: Diagnosis not present

## 2020-04-27 DIAGNOSIS — J40 Bronchitis, not specified as acute or chronic: Secondary | ICD-10-CM

## 2020-04-27 DIAGNOSIS — J9611 Chronic respiratory failure with hypoxia: Secondary | ICD-10-CM | POA: Diagnosis not present

## 2020-04-27 DIAGNOSIS — E66811 Obesity, class 1: Secondary | ICD-10-CM

## 2020-04-27 DIAGNOSIS — E139 Other specified diabetes mellitus without complications: Secondary | ICD-10-CM

## 2020-04-27 DIAGNOSIS — J9621 Acute and chronic respiratory failure with hypoxia: Secondary | ICD-10-CM | POA: Insufficient documentation

## 2020-04-27 DIAGNOSIS — R0781 Pleurodynia: Secondary | ICD-10-CM | POA: Diagnosis not present

## 2020-04-27 MED ORDER — LEVOFLOXACIN 500 MG PO TABS: 500.0000 mg | ORAL_TABLET | Freq: Every day | ORAL | 0 refills | Status: AC

## 2020-04-27 MED ORDER — HYDROCOD POLST-CPM POLST ER 10-8 MG/5ML PO SUER: 5.0000 mL | Freq: Two times a day (BID) | ORAL | 0 refills | Status: AC

## 2020-04-27 NOTE — Assessment & Plan Note (Signed)
Pt was given Levoquin 500mg  PO daily x7 days. He is coughing up yellowish sputum and has a history of pneumonia in the past with a history of chronic bronchitis and COPD due to smoking. He is on oxygen 2L/min 24hrs/day

## 2020-04-27 NOTE — Assessment & Plan Note (Signed)
-   The patient's blood sugar is under control on metformin. - The patient will continue the current treatment regimen.  - I encouraged the patient to regularly check blood sugar.  - I encouraged the patient to monitor diet. I encouraged the patient to eat low-carb and low-sugar to help prevent blood sugar spikes.  - I encouraged the patient to continue following their prescribed treatment plan for diabetes - I informed the patient to get help if blood sugar drops below 54mg /dL, or if suddenly have trouble thinking clearly or breathing.

## 2020-04-27 NOTE — Assessment & Plan Note (Signed)
Pt was advised to lose weight.

## 2020-04-27 NOTE — Progress Notes (Signed)
Established Patient Office Visit  SUBJECTIVE:  Subjective  Patient ID: Julian Sparks, male    DOB: October 27, 1945  Age: 74 y.o. MRN: [id number]  CC:  Chief Complaint  Patient presents with  . recent fall    patient having pain on left side from fall, denies any bruising   . Cough    patient states that he has a lot of mucus   . Diabetes    patient checked blood sugar from home and it was 79    HPI Julian Sparks is a 74 y.o. male presenting today for a cough.   He notes that he fell two weeks ago. He feels like he hurt his left ribs and that it hurts to take a deep breath or when he coughs.    Cough This is a new (Pt is vaccinated against COVID19) problem. The current episode started in the past 7 days. The problem has been gradually worsening. The problem occurs every few minutes. The cough is productive of brown sputum. Associated symptoms include chest pain (Left ribs), rhinorrhea and shortness of breath. The symptoms are aggravated by lying down and exercise. He has tried rest (On Home Oxygen) for the symptoms. His past medical history is significant for bronchitis, COPD and pneumonia.   He checked his blood sugar this morning and it was 79. He notes that last night he didn't snack like he usually does before bed. He doesn't have any dietary restrictions, but he tends to limit how much he will eat if its not particularly healthy.    Past Medical History:  Diagnosis Date  . Chronic bronchitis ([place])   . COPD (chronic obstructive pulmonary disease) ([place])   . Diabetes mellitus without complication ([place])   . Diastolic dysfunction   . Dyspnea    with exertion  . Hypertension     Past Surgical History:  Procedure Laterality Date  . CIRCUMCISION    . SHOULDER ARTHROSCOPY WITH OPEN ROTATOR CUFF REPAIR Left 06/20/2016   Procedure: SHOULDER ARTHROSCOPY WITH OPEN ROTATOR CUFF REPAIR;  Surgeon: Earnestine Leys, MD;  Location: [facility] ORS;  Service: Orthopedics;  Laterality: Left;  . UPPER  GI ENDOSCOPY      Family History  Problem Relation Age of Onset  . Hypertension Mother     Social History   Socioeconomic History  . Marital status: Widowed    Spouse name: Not on file  . Number of children: 2  . Years of education: Not on file  . Highest education level: Not on file  Occupational History  . Not on file  Tobacco Use  . Smoking status: Former Smoker    Quit date: 03/27/2011    Years since quitting: 9.0  . Smokeless tobacco: Current User    Types: Chew  Substance and Sexual Activity  . Alcohol use: No  . Drug use: No  . Sexual activity: Not on file  Other Topics Concern  . Not on file  Social History Narrative  . Not on file   Social Determinants of Health   Financial Resource Strain:   . Difficulty of Paying Living Expenses: Not on file  Food Insecurity:   . Worried About Charity fundraiser in the Last Year: Not on file  . Ran Out of Food in the Last Year: Not on file  Transportation Needs:   . Lack of Transportation (Medical): Not on file  . Lack of Transportation (Non-Medical): Not on file  Physical Activity:   . Days of  Exercise per Week: Not on file  . Minutes of Exercise per Session: Not on file  Stress:   . Feeling of Stress : Not on file  Social Connections:   . Frequency of Communication with Friends and Family: Not on file  . Frequency of Social Gatherings with Friends and Family: Not on file  . Attends Religious Services: Not on file  . Active Member of Clubs or Organizations: Not on file  . Attends Archivist Meetings: Not on file  . Marital Status: Not on file  Intimate Partner Violence:   . Fear of Current or Ex-Partner: Not on file  . Emotionally Abused: Not on file  . Physically Abused: Not on file  . Sexually Abused: Not on file     Current Outpatient Medications:  .  ACCU-CHEK AVIVA PLUS test strip, Check blood sugar bid, Disp: 100 each, Rfl: 3 .  Accu-Chek Softclix Lancets lancets, , Disp: , Rfl:  .   albuterol (VENTOLIN HFA) 108 (90 Base) MCG/ACT inhaler, Inhale 2 puffs into the lungs as needed for wheezing or shortness of breath., Disp: 18 g, Rfl: 3 .  Alcohol Swabs (B-D SINGLE USE SWABS REGULAR) PADS, Use to clean finger to check blood sugar, Disp: 300 each, Rfl: 3 .  aspirin 81 MG EC tablet, Take 81 mg by mouth daily. Swallow whole. , Disp: , Rfl:  .  atorvastatin (LIPITOR) 40 MG tablet, Take 40 mg by mouth every evening. , Disp: , Rfl:  .  B Complex-C (SUPER B COMPLEX PO), Take 1 tablet by mouth daily. , Disp: , Rfl:  .  chlorpheniramine-HYDROcodone (TUSSIONEX) 10-8 MG/5ML SUER, Take 5 mLs by mouth in the morning and at bedtime for 20 days., Disp: 140 mL, Rfl: 0 .  cyclobenzaprine (FLEXERIL) 10 MG tablet, Take 10 mg by mouth at bedtime. , Disp: , Rfl:  .  fluticasone (FLONASE) 50 MCG/ACT nasal spray, Place 1 spray into both nostrils 2 (two) times a day. , Disp: , Rfl:  .  furosemide (LASIX) 20 MG tablet, , Disp: , Rfl:  .  gabapentin (NEURONTIN) 100 MG capsule, Take 2 capsules (200 mg total) by mouth 3 (three) times daily., Disp: 90 capsule, Rfl: 3 .  glipiZIDE (GLUCOTROL) 5 MG tablet, Take 5 mg by mouth 2 (two) times daily before a meal. , Disp: , Rfl:  .  levofloxacin (LEVAQUIN) 500 MG tablet, Take 1 tablet (500 mg total) by mouth daily for 7 days., Disp: 7 tablet, Rfl: 0 .  losartan-hydrochlorothiazide (HYZAAR) 100-25 MG tablet, Take 1 tablet by mouth daily. , Disp: , Rfl:  .  metFORMIN (GLUCOPHAGE) 500 MG tablet, TAKE 1 TABLET TWICE DAILY, Disp: 180 tablet, Rfl: 3 .  Omega-3 Fatty Acids (FISH OIL PO), Take 2 capsules by mouth 2 (two) times daily. , Disp: , Rfl:  .  SYMBICORT 160-4.5 MCG/ACT inhaler, INHALE 2 PUFFS TWICE DAILY, Disp: 1 Inhaler, Rfl: 6 .  traMADol (ULTRAM) 50 MG tablet, Take 1 tablet (50 mg total) by mouth 2 (two) times daily for 15 days., Disp: 30 tablet, Rfl: 0 .  traZODone (DESYREL) 50 MG tablet, Take 100 mg by mouth at bedtime. , Disp: , Rfl:  .  vitamin C (ASCORBIC  ACID) 500 MG tablet, Take 500 mg by mouth daily., Disp: , Rfl:    Allergies  Allergen Reactions  . Aspirin Nausea Only and Other (See Comments)    325 mg aspirin cause stomach upset  . Penicillins Rash    ROS Review  of Systems  Constitutional: Negative.   HENT: Positive for rhinorrhea.   Eyes: Negative.   Respiratory: Positive for cough and shortness of breath.   Cardiovascular: Positive for chest pain (Left ribs).  Gastrointestinal: Negative.   Endocrine: Negative.   Genitourinary: Negative.   Musculoskeletal: Negative.   Skin: Negative.   Allergic/Immunologic: Negative.   Neurological: Negative.   Hematological: Negative.   Psychiatric/Behavioral: Negative.   All other systems reviewed and are negative.    OBJECTIVE:    Physical Exam Vitals reviewed.  Constitutional:      Appearance: Normal appearance.  HENT:     Mouth/Throat:     Mouth: Mucous membranes are moist.  Eyes:     Pupils: Pupils are equal, round, and reactive to light.  Neck:     Vascular: No carotid bruit.  Cardiovascular:     Rate and Rhythm: Normal rate and regular rhythm.     Pulses: Normal pulses.     Heart sounds: Normal heart sounds.  Pulmonary:     Effort: Pulmonary effort is normal.     Breath sounds: Examination of the right-lower field reveals decreased breath sounds. Examination of the left-lower field reveals decreased breath sounds. Decreased breath sounds present. No rhonchi or rales.    Abdominal:     General: Bowel sounds are normal.     Palpations: Abdomen is soft. There is no hepatomegaly, splenomegaly or mass.     Tenderness: There is no abdominal tenderness.     Hernia: No hernia is present.  Musculoskeletal:     Cervical back: Neck supple.     Right lower leg: No edema.     Left lower leg: No edema.  Skin:    Findings: No rash.  Neurological:     Mental Status: He is alert and oriented to person, place, and time.     Motor: No weakness.  Psychiatric:        Mood and  Affect: Mood normal.        Behavior: Behavior normal.     BP (!) 157/89   Pulse (!) 101   Ht 5\' 6"  (1.676 m)   Wt 220 lb 9.6 oz (100.1 kg)   BMI 35.61 kg/m  Wt Readings from Last 3 Encounters:  04/27/20 220 lb 9.6 oz (100.1 kg)  02/17/20 222 lb 14.4 oz (101.1 kg)  08/06/19 211 lb (95.7 kg)    Health Maintenance Due  Topic Date Due  . Hepatitis C Screening  Never done  . FOOT EXAM  Never done  . OPHTHALMOLOGY EXAM  Never done  . TETANUS/TDAP  Never done  . COLONOSCOPY  Never done  . HEMOGLOBIN A1C  08/07/2019  . INFLUENZA VACCINE  03/27/2020    There are no preventive care reminders to display for this patient.  CBC Latest Ref Rng & Units 07/03/2019 03/27/2019 02/05/2019  WBC 4.0 - 10.5 K/uL 10.1 10.4 12.8(H)  Hemoglobin 13.0 - 17.0 g/dL 14.9 15.1 16.1  Hematocrit 39 - 52 % 46.3 46.6 48.5  Platelets 150 - 400 K/uL 215 209 224   CMP Latest Ref Rng & Units 07/03/2019 03/27/2019 02/06/2019  Glucose 70 - 99 mg/dL 195(H) 156(H) 201(H)  BUN 8 - 23 mg/dL 26(H) 28(H) 31(H)  Creatinine 0.61 - 1.24 mg/dL 0.92 0.82 0.89  Sodium 135 - 145 mmol/L 136 143 135  Potassium 3.5 - 5.1 mmol/L 4.0 4.2 3.9  Chloride 98 - 111 mmol/L 101 106 100  CO2 22 - 32 mmol/L 26 27 28   Calcium  8.9 - 10.3 mg/dL 8.9 9.0 8.6(L)  Total Protein 6.5 - 8.1 g/dL 7.6 - -  Total Bilirubin 0.3 - 1.2 mg/dL 0.6 - -  Alkaline Phos 38 - 126 U/L 76 - -  AST 15 - 41 U/L 23 - -  ALT 0 - 44 U/L 26 - -    No results found for: TSH Lab Results  Component Value Date   ALBUMIN 4.3 07/03/2019   ANIONGAP 9 07/03/2019   No results found for: CHOL, HDL, LDLCALC, CHOLHDL No results found for: TRIG Lab Results  Component Value Date   HGBA1C 7.3 (H) 02/05/2019      ASSESSMENT & PLAN:   Problem List Items Addressed This Visit      Respiratory   Chronic respiratory failure with hypoxia ([place])    Pt is on 2L oxygen home therapy.       Bronchitis    Pt was given Levoquin 500mg  PO daily x7 days. He is coughing up  yellowish sputum and has a history of pneumonia in the past with a history of chronic bronchitis and COPD due to smoking. He is on oxygen 2L/min 24hrs/day      Relevant Medications   chlorpheniramine-HYDROcodone (TUSSIONEX) 10-8 MG/5ML SUER   levofloxacin (LEVAQUIN) 500 MG tablet     Endocrine   Diabetes 1.5, managed as type 2 ([place])    - The patient's blood sugar is under control on metformin. - The patient will continue the current treatment regimen.  - I encouraged the patient to regularly check blood sugar.  - I encouraged the patient to monitor diet. I encouraged the patient to eat low-carb and low-sugar to help prevent blood sugar spikes.  - I encouraged the patient to continue following their prescribed treatment plan for diabetes - I informed the patient to get help if blood sugar drops below 54mg /dL, or if suddenly have trouble thinking clearly or breathing.            Musculoskeletal and Integument   Bursitis of shoulder    Pt has bilateral shoulder pain due to bursitis L>R.         Other   Obesity (BMI 30.0-34.9)    Pt was advised to lose weight.       Pleurodynia - Primary    Pt fell down 2 weeks ago. He injured the left side of his chest. He still has some minimal pain on the left side. At the present time we'll treat his cough and bronchitis. He'll return back in 2 weeks; if he is not better we'll do a chest xray         Meds ordered this encounter  Medications  . chlorpheniramine-HYDROcodone (TUSSIONEX) 10-8 MG/5ML SUER    Sig: Take 5 mLs by mouth in the morning and at bedtime for 20 days.    Dispense:  140 mL    Refill:  0  . levofloxacin (LEVAQUIN) 500 MG tablet    Sig: Take 1 tablet (500 mg total) by mouth daily for 7 days.    Dispense:  7 tablet    Refill:  0    Follow-up: No follow-ups on file.    Dr. Jane Canary [facility] [address] [place], [place] [address]   By signing my name below, I, [employer], attest that  this documentation has been prepared under the direction and in the presence of [name], MD. Electronically Signed: [name], MD 04/27/20, 10:36 AM   I personally performed the  services described in this documentation, which was SCRIBED in my presence. The recorded information has been reviewed and considered accurate. It has been edited as necessary during review. [name], MD

## 2020-04-27 NOTE — Assessment & Plan Note (Signed)
Pt has bilateral shoulder pain due to bursitis L>R.

## 2020-04-27 NOTE — Assessment & Plan Note (Signed)
Pt is on 2L oxygen home therapy.

## 2020-04-27 NOTE — Assessment & Plan Note (Signed)
Pt fell down 2 weeks ago. He injured the left side of his chest. He still has some minimal pain on the left side. At the present time we'll treat his cough and bronchitis. He'll return back in 2 weeks; if he is not better we'll do a chest xray

## 2020-05-04 ENCOUNTER — Other Ambulatory Visit: Payer: Self-pay | Admitting: Internal Medicine

## 2020-05-05 ENCOUNTER — Other Ambulatory Visit: Payer: Self-pay | Admitting: *Deleted

## 2020-05-05 MED ORDER — GABAPENTIN 100 MG PO CAPS: 200.0000 mg | ORAL_CAPSULE | Freq: Three times a day (TID) | ORAL | 3 refills | Status: DC

## 2020-05-05 MED ORDER — GABAPENTIN 100 MG PO CAPS: 200.0000 mg | ORAL_CAPSULE | Freq: Three times a day (TID) | ORAL | 0 refills | Status: DC

## 2020-05-11 ENCOUNTER — Other Ambulatory Visit: Payer: Self-pay

## 2020-05-11 ENCOUNTER — Ambulatory Visit (INDEPENDENT_AMBULATORY_CARE_PROVIDER_SITE_OTHER): Payer: Medicare HMO | Admitting: Internal Medicine

## 2020-05-11 ENCOUNTER — Encounter: Payer: Self-pay | Admitting: Internal Medicine

## 2020-05-11 VITALS — BP 162/101 | HR 88 | Ht 67.0 in | Wt 215.1 lb

## 2020-05-11 DIAGNOSIS — R0781 Pleurodynia: Secondary | ICD-10-CM

## 2020-05-11 DIAGNOSIS — Z23 Encounter for immunization: Secondary | ICD-10-CM | POA: Diagnosis not present

## 2020-05-11 DIAGNOSIS — E669 Obesity, unspecified: Secondary | ICD-10-CM

## 2020-05-11 DIAGNOSIS — E139 Other specified diabetes mellitus without complications: Secondary | ICD-10-CM

## 2020-05-11 DIAGNOSIS — J40 Bronchitis, not specified as acute or chronic: Secondary | ICD-10-CM

## 2020-05-11 NOTE — Assessment & Plan Note (Signed)
On the last visit patient complained of a lot of left-sided chest pain.  After a course of antibiotic the pain and bronchitis symptoms have resolved he is feeling much better.  He was advised to return back in 2 months or earlier if needed.  Patient was given the flu shot on today's visit.

## 2020-05-11 NOTE — Assessment & Plan Note (Signed)
Patient has been advised to lose weight.

## 2020-05-11 NOTE — Assessment & Plan Note (Signed)
Patient cough is much better.  His main complaint uncontrolled.  Left-sided pleuritic pain has resolved.

## 2020-05-11 NOTE — Assessment & Plan Note (Signed)
Blood sugar is under control on present medication blood sugar today is 94.  Patient was still advised to try to lose weight.

## 2020-05-11 NOTE — Assessment & Plan Note (Signed)
Received flu shot

## 2020-05-11 NOTE — Progress Notes (Signed)
Established Patient Office Visit  SUBJECTIVE:  Subjective  Patient ID: Julian Sparks, male    DOB: 1946/07/23  Age: 74 y.o. MRN: [id number]  CC:  Chief Complaint  Patient presents with  . Diabetes    patient checked blood sugar from home and it was 94    HPI Julian Sparks is a 74 y.o. male presenting today for a diabetes follow up.  He states that he is feeling a lot better since he got the antibiotics two weeks ago for his cough. His cough has resolved.   His blood sugar was 94 this morning.    Past Medical History:  Diagnosis Date  . Chronic bronchitis ([place])   . COPD (chronic obstructive pulmonary disease) ([place])   . Diabetes mellitus without complication ([place])   . Diastolic dysfunction   . Dyspnea    with exertion  . Hypertension     Past Surgical History:  Procedure Laterality Date  . CIRCUMCISION    . SHOULDER ARTHROSCOPY WITH OPEN ROTATOR CUFF REPAIR Left 06/20/2016   Procedure: SHOULDER ARTHROSCOPY WITH OPEN ROTATOR CUFF REPAIR;  Surgeon: Earnestine Leys, MD;  Location: [facility] ORS;  Service: Orthopedics;  Laterality: Left;  . UPPER GI ENDOSCOPY      Family History  Problem Relation Age of Onset  . Hypertension Mother     Social History   Socioeconomic History  . Marital status: Widowed    Spouse name: Not on file  . Number of children: 2  . Years of education: Not on file  . Highest education level: Not on file  Occupational History  . Not on file  Tobacco Use  . Smoking status: Former Smoker    Quit date: 03/27/2011    Years since quitting: 9.1  . Smokeless tobacco: Current User    Types: Chew  Substance and Sexual Activity  . Alcohol use: No  . Drug use: No  . Sexual activity: Not on file  Other Topics Concern  . Not on file  Social History Narrative  . Not on file   Social Determinants of Health   Financial Resource Strain:   . Difficulty of Paying Living Expenses: Not on file  Food Insecurity:   . Worried About Charity fundraiser in  the Last Year: Not on file  . Ran Out of Food in the Last Year: Not on file  Transportation Needs:   . Lack of Transportation (Medical): Not on file  . Lack of Transportation (Non-Medical): Not on file  Physical Activity:   . Days of Exercise per Week: Not on file  . Minutes of Exercise per Session: Not on file  Stress:   . Feeling of Stress : Not on file  Social Connections:   . Frequency of Communication with Friends and Family: Not on file  . Frequency of Social Gatherings with Friends and Family: Not on file  . Attends Religious Services: Not on file  . Active Member of Clubs or Organizations: Not on file  . Attends Archivist Meetings: Not on file  . Marital Status: Not on file  Intimate Partner Violence:   . Fear of Current or Ex-Partner: Not on file  . Emotionally Abused: Not on file  . Physically Abused: Not on file  . Sexually Abused: Not on file     Current Outpatient Medications:  .  ACCU-CHEK AVIVA PLUS test strip, Check blood sugar bid, Disp: 100 each, Rfl: 3 .  Accu-Chek Softclix Lancets lancets, , Disp: ,  Rfl:  .  albuterol (VENTOLIN HFA) 108 (90 Base) MCG/ACT inhaler, Inhale 2 puffs into the lungs as needed for wheezing or shortness of breath., Disp: 18 g, Rfl: 3 .  Alcohol Swabs (B-D SINGLE USE SWABS REGULAR) PADS, Use to clean finger to check blood sugar, Disp: 300 each, Rfl: 3 .  aspirin 81 MG EC tablet, Take 81 mg by mouth daily. Swallow whole. , Disp: , Rfl:  .  atorvastatin (LIPITOR) 40 MG tablet, TAKE 1 TABLET EVERY DAY, Disp: 90 tablet, Rfl: 3 .  B Complex-C (SUPER B COMPLEX PO), Take 1 tablet by mouth daily. , Disp: , Rfl:  .  chlorpheniramine-HYDROcodone (TUSSIONEX) 10-8 MG/5ML SUER, Take 5 mLs by mouth in the morning and at bedtime for 20 days., Disp: 140 mL, Rfl: 0 .  cyclobenzaprine (FLEXERIL) 10 MG tablet, Take 10 mg by mouth at bedtime. , Disp: , Rfl:  .  fluticasone (FLONASE) 50 MCG/ACT nasal spray, Place 1 spray into both nostrils 2 (two)  times a day. , Disp: , Rfl:  .  furosemide (LASIX) 20 MG tablet, , Disp: , Rfl:  .  gabapentin (NEURONTIN) 100 MG capsule, Take 2 capsules (200 mg total) by mouth 3 (three) times daily., Disp: 90 capsule, Rfl: 0 .  glipiZIDE (GLUCOTROL) 5 MG tablet, Take 5 mg by mouth 2 (two) times daily before a meal. , Disp: , Rfl:  .  losartan-hydrochlorothiazide (HYZAAR) 100-25 MG tablet, Take 1 tablet by mouth daily. , Disp: , Rfl:  .  metFORMIN (GLUCOPHAGE) 500 MG tablet, TAKE 1 TABLET TWICE DAILY, Disp: 180 tablet, Rfl: 3 .  Omega-3 Fatty Acids (FISH OIL PO), Take 2 capsules by mouth 2 (two) times daily. , Disp: , Rfl:  .  SYMBICORT 160-4.5 MCG/ACT inhaler, INHALE 2 PUFFS TWICE DAILY, Disp: 1 Inhaler, Rfl: 6 .  traZODone (DESYREL) 50 MG tablet, Take 100 mg by mouth at bedtime. , Disp: , Rfl:  .  vitamin C (ASCORBIC ACID) 500 MG tablet, Take 500 mg by mouth daily., Disp: , Rfl:    Allergies  Allergen Reactions  . Aspirin Nausea Only and Other (See Comments)    325 mg aspirin cause stomach upset  . Penicillins Rash    ROS Review of Systems  Constitutional: Negative.   HENT: Negative.   Eyes: Negative.   Respiratory: Negative for cough and chest tightness. Shortness of breath: on oxygen.   Cardiovascular: Negative.  Negative for chest pain.  Gastrointestinal: Negative.  Negative for abdominal pain.  Endocrine: Negative.   Genitourinary: Negative.   Musculoskeletal: Positive for myalgias.  Skin: Negative.   Allergic/Immunologic: Negative.   Neurological: Negative.   Hematological: Negative.   Psychiatric/Behavioral: Negative.   All other systems reviewed and are negative.    OBJECTIVE:    Physical Exam Vitals reviewed.  Constitutional:      Appearance: Normal appearance.  HENT:     Mouth/Throat:     Mouth: Mucous membranes are moist.  Eyes:     Pupils: Pupils are equal, round, and reactive to light.  Neck:     Vascular: No carotid bruit.  Cardiovascular:     Rate and Rhythm:  Normal rate and regular rhythm.     Pulses: Normal pulses.     Heart sounds: Normal heart sounds.  Pulmonary:     Effort: Pulmonary effort is normal.     Breath sounds: Decreased breath sounds present. No rhonchi or rales.     Comments: On portable oxygen Abdominal:  General: Bowel sounds are normal.     Palpations: Abdomen is soft. There is no hepatomegaly, splenomegaly or mass.     Tenderness: There is no abdominal tenderness.     Hernia: No hernia is present.  Musculoskeletal:     Cervical back: Neck supple.     Right lower leg: No edema.     Left lower leg: No edema.  Skin:    Findings: No rash.  Neurological:     Mental Status: He is alert and oriented to person, place, and time.     Motor: No weakness.  Psychiatric:        Mood and Affect: Mood normal.        Behavior: Behavior normal.     BP (!) 162/101   Pulse 88   Ht 5\' 7"  (1.702 m)   Wt 215 lb 1.6 oz (97.6 kg)   SpO2 99% Comment: 2 liters  BMI 33.69 kg/m  Wt Readings from Last 3 Encounters:  05/11/20 215 lb 1.6 oz (97.6 kg)  04/27/20 220 lb 9.6 oz (100.1 kg)  02/17/20 222 lb 14.4 oz (101.1 kg)    Health Maintenance Due  Topic Date Due  . Hepatitis C Screening  Never done  . FOOT EXAM  Never done  . OPHTHALMOLOGY EXAM  Never done  . TETANUS/TDAP  Never done  . COLONOSCOPY  Never done  . HEMOGLOBIN A1C  08/07/2019    There are no preventive care reminders to display for this patient.  CBC Latest Ref Rng & Units 07/03/2019 03/27/2019 02/05/2019  WBC 4.0 - 10.5 K/uL 10.1 10.4 12.8(H)  Hemoglobin 13.0 - 17.0 g/dL 14.9 15.1 16.1  Hematocrit 39 - 52 % 46.3 46.6 48.5  Platelets 150 - 400 K/uL 215 209 224   CMP Latest Ref Rng & Units 07/03/2019 03/27/2019 02/06/2019  Glucose 70 - 99 mg/dL 195(H) 156(H) 201(H)  BUN 8 - 23 mg/dL 26(H) 28(H) 31(H)  Creatinine 0.61 - 1.24 mg/dL 0.92 0.82 0.89  Sodium 135 - 145 mmol/L 136 143 135  Potassium 3.5 - 5.1 mmol/L 4.0 4.2 3.9  Chloride 98 - 111 mmol/L 101 106 100    CO2 22 - 32 mmol/L 26 27 28   Calcium 8.9 - 10.3 mg/dL 8.9 9.0 8.6(L)  Total Protein 6.5 - 8.1 g/dL 7.6 - -  Total Bilirubin 0.3 - 1.2 mg/dL 0.6 - -  Alkaline Phos 38 - 126 U/L 76 - -  AST 15 - 41 U/L 23 - -  ALT 0 - 44 U/L 26 - -    No results found for: TSH Lab Results  Component Value Date   ALBUMIN 4.3 07/03/2019   ANIONGAP 9 07/03/2019   No results found for: CHOL, HDL, LDLCALC, CHOLHDL No results found for: TRIG Lab Results  Component Value Date   HGBA1C 7.3 (H) 02/05/2019      ASSESSMENT & PLAN:   Problem List Items Addressed This Visit      Respiratory   Bronchitis    Patient cough is much better.  His main complaint uncontrolled.  Left-sided pleuritic pain has resolved.        Endocrine   Diabetes 1.5, managed as type 2 ([place])    Blood sugar is under control on present medication blood sugar today is 94.  Patient was still advised to try to lose weight.        Other   Obesity (BMI 30.0-34.9)    Patient has been advised to lose weight.  Pleurodynia    On the last visit patient complained of a lot of left-sided chest pain.  After a course of antibiotic the pain and bronchitis symptoms have resolved he is feeling much better.  He was advised to return back in 2 months or earlier if needed.  Patient was given the flu shot on today's visit.      Need for influenza vaccination - Primary    Received flu shot.      Relevant Orders   Flu Vaccine QUAD High Dose(Fluad) (Completed)      No orders of the defined types were placed in this encounter.   Follow-up: Return in about 2 months (around 07/11/2020) for Follow Up.    [name], MD [facility] [address] [place], [place] [address]   By signing my name below, I, [employer], attest that this documentation has been prepared under the direction and in the presence of Dr. [name] Electronically Signed: [name], MD 05/11/20, 10:01 AM  I personally performed the  services described in this documentation, which was SCRIBED in my presence. The recorded information has been reviewed and considered accurate. It has been edited as necessary during review. [name], MD

## 2020-05-18 ENCOUNTER — Other Ambulatory Visit: Payer: Self-pay | Admitting: Internal Medicine

## 2020-06-07 DIAGNOSIS — L03115 Cellulitis of right lower limb: Secondary | ICD-10-CM | POA: Diagnosis not present

## 2020-06-16 DIAGNOSIS — Z09 Encounter for follow-up examination after completed treatment for conditions other than malignant neoplasm: Secondary | ICD-10-CM | POA: Diagnosis not present

## 2020-06-16 DIAGNOSIS — Z872 Personal history of diseases of the skin and subcutaneous tissue: Secondary | ICD-10-CM | POA: Diagnosis not present

## 2020-07-11 ENCOUNTER — Other Ambulatory Visit: Payer: Self-pay | Admitting: *Deleted

## 2020-07-11 MED ORDER — GABAPENTIN 100 MG PO CAPS
300.0000 mg | ORAL_CAPSULE | Freq: Three times a day (TID) | ORAL | 3 refills | Status: DC
Start: 2020-07-11 — End: 2020-07-12

## 2020-07-11 MED ORDER — GABAPENTIN 100 MG PO CAPS
300.0000 mg | ORAL_CAPSULE | Freq: Three times a day (TID) | ORAL | 0 refills | Status: DC
Start: 2020-07-11 — End: 2020-07-11

## 2020-07-12 ENCOUNTER — Other Ambulatory Visit: Payer: Self-pay | Admitting: *Deleted

## 2020-07-12 MED ORDER — GABAPENTIN 300 MG PO CAPS
300.0000 mg | ORAL_CAPSULE | Freq: Three times a day (TID) | ORAL | 0 refills | Status: DC
Start: 2020-07-12 — End: 2020-07-28

## 2020-07-12 MED ORDER — GABAPENTIN 300 MG PO CAPS: 300.0000 mg | ORAL_CAPSULE | Freq: Three times a day (TID) | ORAL | 3 refills | Status: DC

## 2020-07-13 ENCOUNTER — Ambulatory Visit (INDEPENDENT_AMBULATORY_CARE_PROVIDER_SITE_OTHER): Payer: Medicare HMO | Admitting: Internal Medicine

## 2020-07-13 ENCOUNTER — Encounter: Payer: Self-pay | Admitting: Internal Medicine

## 2020-07-13 ENCOUNTER — Other Ambulatory Visit: Payer: Self-pay

## 2020-07-13 VITALS — BP 169/73 | HR 94 | Ht 66.0 in | Wt 220.4 lb

## 2020-07-13 DIAGNOSIS — L03115 Cellulitis of right lower limb: Secondary | ICD-10-CM | POA: Diagnosis not present

## 2020-07-13 DIAGNOSIS — I10 Essential (primary) hypertension: Secondary | ICD-10-CM | POA: Diagnosis not present

## 2020-07-13 DIAGNOSIS — J9611 Chronic respiratory failure with hypoxia: Secondary | ICD-10-CM

## 2020-07-13 DIAGNOSIS — E669 Obesity, unspecified: Secondary | ICD-10-CM

## 2020-07-13 MED ORDER — TRAMADOL HCL 50 MG PO TABS: 50.0000 mg | ORAL_TABLET | Freq: Three times a day (TID) | ORAL | 0 refills | Status: AC | PRN

## 2020-07-13 MED ORDER — SULFAMETHOXAZOLE-TRIMETHOPRIM 800-160 MG PO TABS: 1.0000 | ORAL_TABLET | Freq: Two times a day (BID) | ORAL | 0 refills | Status: DC

## 2020-07-13 NOTE — Progress Notes (Signed)
Established Patient Office Visit  Subjective:  Patient ID: Julian Sparks, male    DOB: 1945/11/29  Age: 74 y.o. MRN: [id number]  CC:  Chief Complaint  Patient presents with  . Diabetes    patient's blod sugar was 108  . Animal Bite    patient requesting refill of doxy for dog bite that hasn't healed     HPI  Julian Sparks presents for cellulitis of the right leg, he has a history of a dog bite about 2 weeks ago.  There is an ulcer formation on the middle of the right leg with discharge.  Patient has been taking doxycycline from some of his friends  Past Medical History:  Diagnosis Date  . Chronic bronchitis ([place])   . COPD (chronic obstructive pulmonary disease) ([place])   . Diabetes mellitus without complication ([place])   . Diastolic dysfunction   . Dyspnea    with exertion  . Hypertension     Past Surgical History:  Procedure Laterality Date  . CIRCUMCISION    . SHOULDER ARTHROSCOPY WITH OPEN ROTATOR CUFF REPAIR Left 06/20/2016   Procedure: SHOULDER ARTHROSCOPY WITH OPEN ROTATOR CUFF REPAIR;  Surgeon: Earnestine Leys, MD;  Location: [facility] ORS;  Service: Orthopedics;  Laterality: Left;  . UPPER GI ENDOSCOPY      Family History  Problem Relation Age of Onset  . Hypertension Mother     Social History   Socioeconomic History  . Marital status: Widowed    Spouse name: Not on file  . Number of children: 2  . Years of education: Not on file  . Highest education level: Not on file  Occupational History  . Not on file  Tobacco Use  . Smoking status: Former Smoker    Quit date: 03/27/2011    Years since quitting: 9.3  . Smokeless tobacco: Current User    Types: Chew  Substance and Sexual Activity  . Alcohol use: No  . Drug use: No  . Sexual activity: Not on file  Other Topics Concern  . Not on file  Social History Narrative  . Not on file   Social Determinants of Health   Financial Resource Strain:   . Difficulty of Paying Living Expenses: Not on file  Food  Insecurity:   . Worried About Charity fundraiser in the Last Year: Not on file  . Ran Out of Food in the Last Year: Not on file  Transportation Needs:   . Lack of Transportation (Medical): Not on file  . Lack of Transportation (Non-Medical): Not on file  Physical Activity:   . Days of Exercise per Week: Not on file  . Minutes of Exercise per Session: Not on file  Stress:   . Feeling of Stress : Not on file  Social Connections:   . Frequency of Communication with Friends and Family: Not on file  . Frequency of Social Gatherings with Friends and Family: Not on file  . Attends Religious Services: Not on file  . Active Member of Clubs or Organizations: Not on file  . Attends Archivist Meetings: Not on file  . Marital Status: Not on file  Intimate Partner Violence:   . Fear of Current or Ex-Partner: Not on file  . Emotionally Abused: Not on file  . Physically Abused: Not on file  . Sexually Abused: Not on file     Current Outpatient Medications:  .  ACCU-CHEK AVIVA PLUS test strip, Check blood sugar bid, Disp: 100 each, Rfl:  3 .  Accu-Chek Softclix Lancets lancets, , Disp: , Rfl:  .  albuterol (VENTOLIN HFA) 108 (90 Base) MCG/ACT inhaler, Inhale 2 puffs into the lungs as needed for wheezing or shortness of breath., Disp: 18 g, Rfl: 3 .  Alcohol Swabs (B-D SINGLE USE SWABS REGULAR) PADS, Use to clean finger to check blood sugar, Disp: 300 each, Rfl: 3 .  aspirin 81 MG EC tablet, Take 81 mg by mouth daily. Swallow whole. , Disp: , Rfl:  .  atorvastatin (LIPITOR) 40 MG tablet, TAKE 1 TABLET EVERY DAY, Disp: 90 tablet, Rfl: 3 .  B Complex-C (SUPER B COMPLEX PO), Take 1 tablet by mouth daily. , Disp: , Rfl:  .  cyclobenzaprine (FLEXERIL) 10 MG tablet, Take 10 mg by mouth at bedtime. , Disp: , Rfl:  .  fluticasone (FLONASE) 50 MCG/ACT nasal spray, Place 1 spray into both nostrils 2 (two) times a day. , Disp: , Rfl:  .  furosemide (LASIX) 20 MG tablet, , Disp: , Rfl:  .   gabapentin (NEURONTIN) 300 MG capsule, Take 1 capsule (300 mg total) by mouth 3 (three) times daily., Disp: 30 capsule, Rfl: 0 .  glipiZIDE (GLUCOTROL) 5 MG tablet, Take 5 mg by mouth 2 (two) times daily before a meal. , Disp: , Rfl:  .  losartan-hydrochlorothiazide (HYZAAR) 100-25 MG tablet, TAKE 1 TABLET EVERY DAY, Disp: 90 tablet, Rfl: 3 .  metFORMIN (GLUCOPHAGE) 500 MG tablet, TAKE 1 TABLET TWICE DAILY, Disp: 180 tablet, Rfl: 3 .  Omega-3 Fatty Acids (FISH OIL PO), Take 2 capsules by mouth 2 (two) times daily. , Disp: , Rfl:  .  SYMBICORT 160-4.5 MCG/ACT inhaler, INHALE 2 PUFFS TWICE DAILY, Disp: 1 Inhaler, Rfl: 6 .  traZODone (DESYREL) 50 MG tablet, Take 100 mg by mouth at bedtime. , Disp: , Rfl:  .  vitamin C (ASCORBIC ACID) 500 MG tablet, Take 500 mg by mouth daily., Disp: , Rfl:  .  sulfamethoxazole-trimethoprim (BACTRIM DS) 800-160 MG tablet, Take 1 tablet by mouth 2 (two) times daily for 14 days., Disp: 28 tablet, Rfl: 0 .  traMADol (ULTRAM) 50 MG tablet, Take 1 tablet (50 mg total) by mouth every 8 (eight) hours as needed for up to 5 days., Disp: 15 tablet, Rfl: 0   Allergies  Allergen Reactions  . Aspirin Nausea Only and Other (See Comments)    325 mg aspirin cause stomach upset  . Penicillins Rash    ROS Review of Systems  Constitutional: Negative.   HENT: Negative.   Eyes: Negative.   Respiratory: Negative.   Cardiovascular: Negative.   Gastrointestinal: Negative.   Endocrine: Negative.   Genitourinary: Negative.   Musculoskeletal: Negative.        Painful ulceration of the right leg with swelling of the right leg.  Skin: Negative.   Allergic/Immunologic: Negative.   Neurological: Negative.   Hematological: Negative.   Psychiatric/Behavioral: Negative.   All other systems reviewed and are negative.     Objective:    Physical Exam Vitals reviewed.  Constitutional:      Appearance: Normal appearance.  HENT:     Mouth/Throat:     Mouth: Mucous membranes are  moist.  Eyes:     Pupils: Pupils are equal, round, and reactive to light.  Neck:     Vascular: No carotid bruit.  Cardiovascular:     Rate and Rhythm: Normal rate and regular rhythm.     Pulses: Normal pulses.     Heart sounds: Normal heart  sounds.  Pulmonary:     Effort: Pulmonary effort is normal.     Breath sounds: Normal breath sounds.  Abdominal:     General: Bowel sounds are normal.     Palpations: Abdomen is soft. There is no hepatomegaly, splenomegaly or mass.     Tenderness: There is no abdominal tenderness.     Hernia: No hernia is present.  Musculoskeletal:        General: Swelling and tenderness present.     Cervical back: Neck supple.     Right lower leg: Edema present.     Left lower leg: No edema.  Skin:    Coloration: Skin is not jaundiced.     Findings: No rash.  Neurological:     General: No focal deficit present.     Mental Status: He is alert and oriented to person, place, and time.     Motor: No weakness.  Psychiatric:        Mood and Affect: Mood normal.        Behavior: Behavior normal.        Thought Content: Thought content normal.     BP (!) 169/73   Pulse 94   Ht 5\' 6"  (1.676 m)   Wt 220 lb 6.4 oz (100 kg)   BMI 35.57 kg/m  Wt Readings from Last 3 Encounters:  07/13/20 220 lb 6.4 oz (100 kg)  05/11/20 215 lb 1.6 oz (97.6 kg)  04/27/20 220 lb 9.6 oz (100.1 kg)     Health Maintenance Due  Topic Date Due  . Hepatitis C Screening  Never done  . FOOT EXAM  Never done  . OPHTHALMOLOGY EXAM  Never done  . TETANUS/TDAP  Never done  . COLONOSCOPY  Never done  . HEMOGLOBIN A1C  08/07/2019    There are no preventive care reminders to display for this patient.  No results found for: TSH Lab Results  Component Value Date   WBC 10.1 07/03/2019   HGB 14.9 07/03/2019   HCT 46.3 07/03/2019   MCV 94.5 07/03/2019   PLT 215 07/03/2019   Lab Results  Component Value Date   NA 136 07/03/2019   K 4.0 07/03/2019   CO2 26 07/03/2019    GLUCOSE 195 (H) 07/03/2019   BUN 26 (H) 07/03/2019   CREATININE 0.92 07/03/2019   BILITOT 0.6 07/03/2019   ALKPHOS 76 07/03/2019   AST 23 07/03/2019   ALT 26 07/03/2019   PROT 7.6 07/03/2019   ALBUMIN 4.3 07/03/2019   CALCIUM 8.9 07/03/2019   ANIONGAP 9 07/03/2019   No results found for: CHOL No results found for: HDL No results found for: LDLCALC No results found for: TRIG No results found for: CHOLHDL Lab Results  Component Value Date   HGBA1C 7.3 (H) 02/05/2019      Assessment & Plan:   Problem List Items Addressed This Visit      Cardiovascular and Mediastinum   Essential hypertension    Blood pressure is elevated today because of pain in the right lower leg he takes his medicine regularly.        Respiratory   Chronic respiratory failure with hypoxia ([place])    Patient does not smoke anymore.  He is on oxygen therapy.        Other   Obesity (BMI 30.0-34.9)    Patient was advised to follow low carbohydrate diet and to lose weight.      Cellulitis of right lower extremity - Primary  Patient has a history of dog bite in the right leg which got infected.  He has been using doxycycline from an outside source.  I will change his medicine to Septra DS 1 tablet twice a day for 14 days will make an appointment for him to see a vascular surgeon.  He was advised to see me back again in 1 week.  He was told that this i if swelling getting worse has fevers or chills he should go to the hospital.      Relevant Medications   sulfamethoxazole-trimethoprim (BACTRIM DS) 800-160 MG tablet   traMADol (ULTRAM) 50 MG tablet      Meds ordered this encounter  Medications  . sulfamethoxazole-trimethoprim (BACTRIM DS) 800-160 MG tablet    Sig: Take 1 tablet by mouth 2 (two) times daily for 14 days.    Dispense:  28 tablet    Refill:  0  . traMADol (ULTRAM) 50 MG tablet    Sig: Take 1 tablet (50 mg total) by mouth every 8 (eight) hours as needed for up to 5 days.    Dispense:   15 tablet    Refill:  0    Follow-up: No follow-ups on file.    [name], MD

## 2020-07-13 NOTE — Assessment & Plan Note (Signed)
Patient has a history of dog bite in the right leg which got infected.  He has been using doxycycline from an outside source.  I will change his medicine to Septra DS 1 tablet twice a day for 14 days will make an appointment for him to see a vascular surgeon.  He was advised to see me back again in 1 week.  He was told that this i if swelling getting worse has fevers or chills he should go to the hospital.

## 2020-07-13 NOTE — Assessment & Plan Note (Signed)
Patient does not smoke anymore.  He is on oxygen therapy.

## 2020-07-13 NOTE — Assessment & Plan Note (Signed)
Patient was advised to follow low carbohydrate diet and to lose weight.

## 2020-07-13 NOTE — Assessment & Plan Note (Signed)
Blood pressure is elevated today because of pain in the right lower leg he takes his medicine regularly.

## 2020-07-14 NOTE — Addendum Note (Signed)
Addended by: Alois Cliche on: 07/14/2020 10:26 AM   Modules accepted: Orders

## 2020-07-15 DIAGNOSIS — E261 Secondary hyperaldosteronism: Secondary | ICD-10-CM | POA: Diagnosis not present

## 2020-07-15 DIAGNOSIS — Z6833 Body mass index (BMI) 33.0-33.9, adult: Secondary | ICD-10-CM | POA: Diagnosis not present

## 2020-07-15 DIAGNOSIS — Z87891 Personal history of nicotine dependence: Secondary | ICD-10-CM | POA: Diagnosis not present

## 2020-07-15 DIAGNOSIS — J449 Chronic obstructive pulmonary disease, unspecified: Secondary | ICD-10-CM | POA: Diagnosis not present

## 2020-07-15 DIAGNOSIS — I509 Heart failure, unspecified: Secondary | ICD-10-CM | POA: Diagnosis not present

## 2020-07-19 ENCOUNTER — Ambulatory Visit (INDEPENDENT_AMBULATORY_CARE_PROVIDER_SITE_OTHER): Payer: Medicare HMO | Admitting: Internal Medicine

## 2020-07-19 ENCOUNTER — Other Ambulatory Visit: Payer: Self-pay

## 2020-07-19 ENCOUNTER — Encounter: Payer: Self-pay | Admitting: Internal Medicine

## 2020-07-19 VITALS — BP 118/69 | HR 84 | Ht 66.0 in | Wt 225.3 lb

## 2020-07-19 DIAGNOSIS — I10 Essential (primary) hypertension: Secondary | ICD-10-CM

## 2020-07-19 DIAGNOSIS — L03115 Cellulitis of right lower limb: Secondary | ICD-10-CM

## 2020-07-19 DIAGNOSIS — J9611 Chronic respiratory failure with hypoxia: Secondary | ICD-10-CM

## 2020-07-19 DIAGNOSIS — E669 Obesity, unspecified: Secondary | ICD-10-CM

## 2020-07-19 DIAGNOSIS — E139 Other specified diabetes mellitus without complications: Secondary | ICD-10-CM

## 2020-07-19 LAB — GLUCOSE, POCT (MANUAL RESULT ENTRY): POC Glucose: 106 mg/dl — AB (ref 70–99)

## 2020-07-19 MED ORDER — SULFAMETHOXAZOLE-TRIMETHOPRIM 800-160 MG PO TABS: 1.0000 | ORAL_TABLET | Freq: Two times a day (BID) | ORAL | 0 refills | Status: AC

## 2020-07-19 NOTE — Progress Notes (Signed)
Established Patient Office Visit  Subjective:  Patient ID: Julian Sparks, male    DOB: [birth year]  Age: 74 y.o. MRN: [id number]  CC:  Chief Complaint  Patient presents with  . Cellulitis    right lower extremity, 1 week follow up     HPI  Julian Sparks presents for Right leg is more swollen than the left  with ulcer formation in the right leg.  He denies any history of fever chills.  Patient is oxygen dependent because of the COPD.  He is on Bactrim DS 1 twice a day for the cellulitis of the right leg.  Past Medical History:  Diagnosis Date  . Chronic bronchitis ([place])   . COPD (chronic obstructive pulmonary disease) ([place])   . Diabetes mellitus without complication ([place])   . Diastolic dysfunction   . Dyspnea    with exertion  . Hypertension     Past Surgical History:  Procedure Laterality Date  . CIRCUMCISION    . SHOULDER ARTHROSCOPY WITH OPEN ROTATOR CUFF REPAIR Left 06/20/2016   Procedure: SHOULDER ARTHROSCOPY WITH OPEN ROTATOR CUFF REPAIR;  Surgeon: Earnestine Leys, MD;  Location: [facility] ORS;  Service: Orthopedics;  Laterality: Left;  . UPPER GI ENDOSCOPY      Family History  Problem Relation Age of Onset  . Hypertension Mother     Social History   Socioeconomic History  . Marital status: Widowed    Spouse name: Not on file  . Number of children: 2  . Years of education: Not on file  . Highest education level: Not on file  Occupational History  . Not on file  Tobacco Use  . Smoking status: Former Smoker    Quit date: 03/27/2011    Years since quitting: 9.3  . Smokeless tobacco: Current User    Types: Chew  Substance and Sexual Activity  . Alcohol use: No  . Drug use: No  . Sexual activity: Not on file  Other Topics Concern  . Not on file  Social History Narrative  . Not on file   Social Determinants of Health   Financial Resource Strain:   . Difficulty of Paying Living Expenses: Not on file  Food Insecurity:   . Worried About Charity fundraiser  in the Last Year: Not on file  . Ran Out of Food in the Last Year: Not on file  Transportation Needs:   . Lack of Transportation (Medical): Not on file  . Lack of Transportation (Non-Medical): Not on file  Physical Activity:   . Days of Exercise per Week: Not on file  . Minutes of Exercise per Session: Not on file  Stress:   . Feeling of Stress : Not on file  Social Connections:   . Frequency of Communication with Friends and Family: Not on file  . Frequency of Social Gatherings with Friends and Family: Not on file  . Attends Religious Services: Not on file  . Active Member of Clubs or Organizations: Not on file  . Attends Archivist Meetings: Not on file  . Marital Status: Not on file  Intimate Partner Violence:   . Fear of Current or Ex-Partner: Not on file  . Emotionally Abused: Not on file  . Physically Abused: Not on file  . Sexually Abused: Not on file     Current Outpatient Medications:  .  ACCU-CHEK AVIVA PLUS test strip, Check blood sugar bid, Disp: 100 each, Rfl: 3 .  Accu-Chek Softclix Lancets lancets, , Disp: ,  Rfl:  .  albuterol (VENTOLIN HFA) 108 (90 Base) MCG/ACT inhaler, Inhale 2 puffs into the lungs as needed for wheezing or shortness of breath., Disp: 18 g, Rfl: 3 .  Alcohol Swabs (B-D SINGLE USE SWABS REGULAR) PADS, Use to clean finger to check blood sugar, Disp: 300 each, Rfl: 3 .  aspirin 81 MG EC tablet, Take 81 mg by mouth daily. Swallow whole. , Disp: , Rfl:  .  atorvastatin (LIPITOR) 40 MG tablet, TAKE 1 TABLET EVERY DAY, Disp: 90 tablet, Rfl: 3 .  B Complex-C (SUPER B COMPLEX PO), Take 1 tablet by mouth daily. , Disp: , Rfl:  .  cyclobenzaprine (FLEXERIL) 10 MG tablet, Take 10 mg by mouth at bedtime. , Disp: , Rfl:  .  fluticasone (FLONASE) 50 MCG/ACT nasal spray, Place 1 spray into both nostrils 2 (two) times a day. , Disp: , Rfl:  .  furosemide (LASIX) 20 MG tablet, , Disp: , Rfl:  .  gabapentin (NEURONTIN) 300 MG capsule, Take 1 capsule (300  mg total) by mouth 3 (three) times daily., Disp: 30 capsule, Rfl: 0 .  glipiZIDE (GLUCOTROL) 5 MG tablet, Take 5 mg by mouth 2 (two) times daily before a meal. , Disp: , Rfl:  .  losartan-hydrochlorothiazide (HYZAAR) 100-25 MG tablet, TAKE 1 TABLET EVERY DAY, Disp: 90 tablet, Rfl: 3 .  metFORMIN (GLUCOPHAGE) 500 MG tablet, TAKE 1 TABLET TWICE DAILY, Disp: 180 tablet, Rfl: 3 .  Omega-3 Fatty Acids (FISH OIL PO), Take 2 capsules by mouth 2 (two) times daily. , Disp: , Rfl:  .  sulfamethoxazole-trimethoprim (BACTRIM DS) 800-160 MG tablet, Take 1 tablet by mouth 2 (two) times daily for 14 days., Disp: 28 tablet, Rfl: 0 .  SYMBICORT 160-4.5 MCG/ACT inhaler, INHALE 2 PUFFS TWICE DAILY, Disp: 1 Inhaler, Rfl: 6 .  traZODone (DESYREL) 50 MG tablet, Take 100 mg by mouth at bedtime. , Disp: , Rfl:  .  vitamin C (ASCORBIC ACID) 500 MG tablet, Take 500 mg by mouth daily., Disp: , Rfl:    Allergies  Allergen Reactions  . Aspirin Nausea Only and Other (See Comments)    325 mg aspirin cause stomach upset  . Penicillins Rash    ROS Review of Systems  Constitutional: Negative.   HENT: Negative.   Eyes: Negative.   Respiratory: Positive for shortness of breath and wheezing. Negative for cough.   Cardiovascular: Negative.   Gastrointestinal: Negative.   Endocrine: Negative.   Genitourinary: Negative.  Negative for dysuria.  Musculoskeletal: Negative.  Negative for arthralgias.  Skin: Negative.   Allergic/Immunologic: Negative.   Neurological: Negative.  Negative for seizures.  Hematological: Negative.   Psychiatric/Behavioral: Negative.  Negative for confusion.  All other systems reviewed and are negative.     Objective:    Physical Exam Vitals reviewed.  Constitutional:      Appearance: Normal appearance.  HENT:     Mouth/Throat:     Mouth: Mucous membranes are moist.  Eyes:     Pupils: Pupils are equal, round, and reactive to light.  Neck:     Vascular: No carotid bruit.    Cardiovascular:     Rate and Rhythm: Normal rate and regular rhythm.     Pulses: Normal pulses.     Heart sounds: Normal heart sounds.  Pulmonary:     Effort: Pulmonary effort is normal.     Breath sounds: Normal breath sounds.  Abdominal:     General: Bowel sounds are normal.     Palpations:  Abdomen is soft. There is no hepatomegaly, splenomegaly or mass.     Tenderness: There is no abdominal tenderness.     Hernia: No hernia is present.  Musculoskeletal:     Cervical back: Neck supple.     Right lower leg: Edema present.     Left lower leg: Edema present.     Comments: There is cellulitis of the right leg.  There is also an ulcer formation in the middle of the right leg it is healing slowly.  Patient is taking Bactrim 1 tablet twice a day  Skin:    Findings: No rash.  Neurological:     Mental Status: He is alert and oriented to person, place, and time.     Motor: No weakness.  Psychiatric:        Mood and Affect: Mood normal.        Behavior: Behavior normal.     BP 118/69   Pulse 84   Ht 5\' 6"  (1.676 m)   Wt 225 lb 4.8 oz (102.2 kg)   SpO2 (!) 84% Comment: Patient on 2L of oxygen  BMI 36.36 kg/m  Wt Readings from Last 3 Encounters:  07/19/20 225 lb 4.8 oz (102.2 kg)  07/13/20 220 lb 6.4 oz (100 kg)  05/11/20 215 lb 1.6 oz (97.6 kg)     Health Maintenance Due  Topic Date Due  . Hepatitis C Screening  Never done  . FOOT EXAM  Never done  . OPHTHALMOLOGY EXAM  Never done  . TETANUS/TDAP  Never done  . COLONOSCOPY  Never done  . HEMOGLOBIN A1C  08/07/2019    There are no preventive care reminders to display for this patient.  No results found for: TSH Lab Results  Component Value Date   WBC 10.1 07/03/2019   HGB 14.9 07/03/2019   HCT 46.3 07/03/2019   MCV 94.5 07/03/2019   PLT 215 07/03/2019   Lab Results  Component Value Date   NA 136 07/03/2019   K 4.0 07/03/2019   CO2 26 07/03/2019   GLUCOSE 195 (H) 07/03/2019   BUN 26 (H) 07/03/2019    CREATININE 0.92 07/03/2019   BILITOT 0.6 07/03/2019   ALKPHOS 76 07/03/2019   AST 23 07/03/2019   ALT 26 07/03/2019   PROT 7.6 07/03/2019   ALBUMIN 4.3 07/03/2019   CALCIUM 8.9 07/03/2019   ANIONGAP 9 07/03/2019   No results found for: CHOL No results found for: HDL No results found for: LDLCALC No results found for: TRIG No results found for: CHOLHDL Lab Results  Component Value Date   HGBA1C 7.3 (H) 02/05/2019      Assessment & Plan:   Problem List Items Addressed This Visit      Cardiovascular and Mediastinum   Essential hypertension    - Today, the patient's blood pressure is well managed on present regime. - The patient will continue the current treatment regimen.  - I encouraged the patient to eat a low-sodium diet to help control blood pressure. - I encouraged the patient to live an active lifestyle and complete activities that increases heart rate to 85% target heart rate at least 5 times per week for one hour.             Respiratory   Chronic respiratory failure with hypoxia ([place])    Patient has COPD B-cell lymphoma and he is also on 24-hour oxygen.  He has not been able to lose weight.  Blood pressure is under control.  Endocrine   Diabetes 1.5, managed as type 2 ([place]) - Primary    Patient did glipizide and Metformin for diabetes.  He was advised to lose weight.  He does not smoke, he does not drink.      Relevant Orders   POCT glucose (manual entry) (Completed)     Other   Obesity (BMI 30.0-34.9)   Cellulitis of right lower extremity    He was started on antibiotic from the right lower extremity cellulitis.  There is no evidence of DVT.  He was advised to return in 1 week when he finished the course.      Relevant Medications   sulfamethoxazole-trimethoprim (BACTRIM DS) 800-160 MG tablet      Meds ordered this encounter  Medications  . sulfamethoxazole-trimethoprim (BACTRIM DS) 800-160 MG tablet    Sig: Take 1 tablet by mouth 2 (two)  times daily for 14 days.    Dispense:  28 tablet    Refill:  0    Follow-up: No follow-ups on file.  Patient does not have any significant vascular disease my ABI index in the past.  [name], MD

## 2020-07-24 ENCOUNTER — Encounter: Payer: Self-pay | Admitting: Internal Medicine

## 2020-07-24 NOTE — Assessment & Plan Note (Signed)
He was started on antibiotic from the right lower extremity cellulitis.  There is no evidence of DVT.  He was advised to return in 1 week when he finished the course.

## 2020-07-24 NOTE — Assessment & Plan Note (Signed)
-   Today, the patient's blood pressure is well managed on present regime. - The patient will continue the current treatment regimen.  - I encouraged the patient to eat a low-sodium diet to help control blood pressure. - I encouraged the patient to live an active lifestyle and complete activities that increases heart rate to 85% target heart rate at least 5 times per week for one hour.

## 2020-07-24 NOTE — Assessment & Plan Note (Signed)
Patient did glipizide and Metformin for diabetes.  He was advised to lose weight.  He does not smoke, he does not drink.

## 2020-07-24 NOTE — Assessment & Plan Note (Signed)
Patient has COPD B-cell lymphoma and he is also on 24-hour oxygen.  He has not been able to lose weight.  Blood pressure is under control.

## 2020-07-27 ENCOUNTER — Other Ambulatory Visit: Payer: Self-pay | Admitting: Internal Medicine

## 2020-07-27 ENCOUNTER — Other Ambulatory Visit: Payer: Self-pay | Admitting: *Deleted

## 2020-07-28 ENCOUNTER — Other Ambulatory Visit: Payer: Self-pay | Admitting: *Deleted

## 2020-07-28 MED ORDER — GABAPENTIN 300 MG PO CAPS
300.0000 mg | ORAL_CAPSULE | Freq: Three times a day (TID) | ORAL | 3 refills | Status: DC
Start: 2020-07-28 — End: 2020-09-13

## 2020-08-03 ENCOUNTER — Ambulatory Visit (INDEPENDENT_AMBULATORY_CARE_PROVIDER_SITE_OTHER): Payer: Medicare HMO | Admitting: Internal Medicine

## 2020-08-03 ENCOUNTER — Other Ambulatory Visit: Payer: Self-pay | Admitting: Internal Medicine

## 2020-08-03 ENCOUNTER — Other Ambulatory Visit: Payer: Self-pay

## 2020-08-03 ENCOUNTER — Encounter: Payer: Self-pay | Admitting: Internal Medicine

## 2020-08-03 VITALS — BP 150/76 | HR 78 | Ht 66.0 in | Wt 225.0 lb

## 2020-08-03 DIAGNOSIS — E669 Obesity, unspecified: Secondary | ICD-10-CM | POA: Diagnosis not present

## 2020-08-03 DIAGNOSIS — E139 Other specified diabetes mellitus without complications: Secondary | ICD-10-CM

## 2020-08-03 DIAGNOSIS — J9611 Chronic respiratory failure with hypoxia: Secondary | ICD-10-CM | POA: Diagnosis not present

## 2020-08-03 DIAGNOSIS — I10 Essential (primary) hypertension: Secondary | ICD-10-CM

## 2020-08-03 DIAGNOSIS — R609 Edema, unspecified: Secondary | ICD-10-CM | POA: Diagnosis not present

## 2020-08-03 DIAGNOSIS — L03115 Cellulitis of right lower limb: Secondary | ICD-10-CM | POA: Diagnosis not present

## 2020-08-03 MED ORDER — TRAMADOL HCL 50 MG PO TABS
50.0000 mg | ORAL_TABLET | Freq: Three times a day (TID) | ORAL | 0 refills | Status: AC | PRN
Start: 2020-08-03 — End: 2020-08-08

## 2020-08-03 MED ORDER — FUROSEMIDE 20 MG PO TABS: 40.0000 mg | ORAL_TABLET | Freq: Every day | ORAL | 4 refills | Status: DC

## 2020-08-03 NOTE — Assessment & Plan Note (Signed)
Patient was advised to follow antidiabetic diet and lose weight.  Blood sugar high today

## 2020-08-03 NOTE — Assessment & Plan Note (Signed)
-   I encouraged the patient to lose weight.  - I educated them on making healthy dietary choices including eating more fruits and vegetables and less fried foods. - I encouraged the patient to exercise more, and educated on the benefits of exercise including weight loss, diabetes prevention, and hypertension prevention.   

## 2020-08-03 NOTE — Assessment & Plan Note (Signed)
We will start patient on Lasix 40 mg daily

## 2020-08-03 NOTE — Assessment & Plan Note (Signed)
Patient has a COPD and cor pulmonale also has a swollen both legs.  I started him on Lasix 40 mg p.o. daily

## 2020-08-03 NOTE — Progress Notes (Signed)
Established Patient Office Visit  Subjective:  Patient ID: Julian Sparks, male    DOB: [birth year]  Age: 74 y.o. MRN: [id number]  CC:  Chief Complaint  Patient presents with  . Cellulitis    2 week follow up     HPI  Julian Sparks presents for copd follow up,.  Patient has been unhealing wound on the right leg we will refer him to the wound clinic.  Both legs are swollen so I started him on Lasix 40 mg p.o. daily.  He was also advised to wrap both legs.  Past Medical History:  Diagnosis Date  . Chronic bronchitis ([place])   . COPD (chronic obstructive pulmonary disease) ([place])   . Diabetes mellitus without complication ([place])   . Diastolic dysfunction   . Dyspnea    with exertion  . Hypertension     Past Surgical History:  Procedure Laterality Date  . CIRCUMCISION    . SHOULDER ARTHROSCOPY WITH OPEN ROTATOR CUFF REPAIR Left 06/20/2016   Procedure: SHOULDER ARTHROSCOPY WITH OPEN ROTATOR CUFF REPAIR;  Surgeon: Earnestine Leys, MD;  Location: [facility] ORS;  Service: Orthopedics;  Laterality: Left;  . UPPER GI ENDOSCOPY      Family History  Problem Relation Age of Onset  . Hypertension Mother     Social History   Socioeconomic History  . Marital status: Widowed    Spouse name: Not on file  . Number of children: 2  . Years of education: Not on file  . Highest education level: Not on file  Occupational History  . Not on file  Tobacco Use  . Smoking status: Former Smoker    Quit date: 03/27/2011    Years since quitting: 9.3  . Smokeless tobacco: Current User    Types: Chew  Substance and Sexual Activity  . Alcohol use: No  . Drug use: No  . Sexual activity: Not on file  Other Topics Concern  . Not on file  Social History Narrative  . Not on file   Social Determinants of Health   Financial Resource Strain:   . Difficulty of Paying Living Expenses: Not on file  Food Insecurity:   . Worried About Charity fundraiser in the Last Year: Not on file  . Ran Out of Food in  the Last Year: Not on file  Transportation Needs:   . Lack of Transportation (Medical): Not on file  . Lack of Transportation (Non-Medical): Not on file  Physical Activity:   . Days of Exercise per Week: Not on file  . Minutes of Exercise per Session: Not on file  Stress:   . Feeling of Stress : Not on file  Social Connections:   . Frequency of Communication with Friends and Family: Not on file  . Frequency of Social Gatherings with Friends and Family: Not on file  . Attends Religious Services: Not on file  . Active Member of Clubs or Organizations: Not on file  . Attends Archivist Meetings: Not on file  . Marital Status: Not on file  Intimate Partner Violence:   . Fear of Current or Ex-Partner: Not on file  . Emotionally Abused: Not on file  . Physically Abused: Not on file  . Sexually Abused: Not on file     Current Outpatient Medications:  .  ACCU-CHEK AVIVA PLUS test strip, Check blood sugar bid, Disp: 100 each, Rfl: 3 .  Accu-Chek Softclix Lancets lancets, , Disp: , Rfl:  .  albuterol (VENTOLIN HFA)  108 (90 Base) MCG/ACT inhaler, Inhale 2 puffs into the lungs as needed for wheezing or shortness of breath., Disp: 18 g, Rfl: 3 .  Alcohol Swabs (B-D SINGLE USE SWABS REGULAR) PADS, Use to clean finger to check blood sugar, Disp: 300 each, Rfl: 3 .  aspirin 81 MG EC tablet, Take 81 mg by mouth daily. Swallow whole. , Disp: , Rfl:  .  atorvastatin (LIPITOR) 40 MG tablet, TAKE 1 TABLET EVERY DAY, Disp: 90 tablet, Rfl: 3 .  B Complex-C (SUPER B COMPLEX PO), Take 1 tablet by mouth daily. , Disp: , Rfl:  .  cyclobenzaprine (FLEXERIL) 10 MG tablet, Take 10 mg by mouth at bedtime. , Disp: , Rfl:  .  fluticasone (FLONASE) 50 MCG/ACT nasal spray, Place 1 spray into both nostrils 2 (two) times a day. , Disp: , Rfl:  .  furosemide (LASIX) 20 MG tablet, Take 2 tablets (40 mg total) by mouth daily., Disp: 90 tablet, Rfl: 4 .  gabapentin (NEURONTIN) 300 MG capsule, Take 1 capsule (300  mg total) by mouth 3 (three) times daily., Disp: 90 capsule, Rfl: 3 .  glipiZIDE (GLUCOTROL) 5 MG tablet, Take 5 mg by mouth 2 (two) times daily before a meal. , Disp: , Rfl:  .  losartan-hydrochlorothiazide (HYZAAR) 100-25 MG tablet, TAKE 1 TABLET EVERY DAY, Disp: 90 tablet, Rfl: 3 .  metFORMIN (GLUCOPHAGE) 500 MG tablet, TAKE 1 TABLET TWICE DAILY, Disp: 180 tablet, Rfl: 3 .  Omega-3 Fatty Acids (FISH OIL PO), Take 2 capsules by mouth 2 (two) times daily. , Disp: , Rfl:  .  SYMBICORT 160-4.5 MCG/ACT inhaler, INHALE 2 PUFFS TWICE DAILY, Disp: 1 Inhaler, Rfl: 6 .  traZODone (DESYREL) 50 MG tablet, TAKE 2 TABLETS EVERY DAY, Disp: 180 tablet, Rfl: 3 .  vitamin C (ASCORBIC ACID) 500 MG tablet, Take 500 mg by mouth daily., Disp: , Rfl:  .  traMADol (ULTRAM) 50 MG tablet, Take 1 tablet (50 mg total) by mouth every 8 (eight) hours as needed for up to 5 days., Disp: 15 tablet, Rfl: 0   Allergies  Allergen Reactions  . Aspirin Nausea Only and Other (See Comments)    325 mg aspirin cause stomach upset  . Penicillins Rash    ROS Review of Systems  Constitutional: Negative.   HENT: Negative.   Eyes: Negative.   Respiratory: Negative.   Cardiovascular: Negative.   Gastrointestinal: Negative.   Endocrine: Negative.   Genitourinary: Negative.   Musculoskeletal: Negative.   Skin: Negative.   Allergic/Immunologic: Negative.   Neurological: Negative.   Hematological: Negative.   Psychiatric/Behavioral: Negative.   All other systems reviewed and are negative.     Objective:    Physical Exam Vitals reviewed.  Constitutional:      Appearance: Normal appearance.  HENT:     Mouth/Throat:     Mouth: Mucous membranes are moist.  Eyes:     Pupils: Pupils are equal, round, and reactive to light.  Neck:     Vascular: No carotid bruit.  Cardiovascular:     Rate and Rhythm: Normal rate and regular rhythm.     Pulses: Normal pulses.     Heart sounds: Normal heart sounds.  Pulmonary:      Effort: Pulmonary effort is normal.     Breath sounds: Normal breath sounds.  Abdominal:     General: Bowel sounds are normal.     Palpations: Abdomen is soft. There is no hepatomegaly, splenomegaly or mass.     Tenderness: There  is no abdominal tenderness.     Hernia: No hernia is present.  Musculoskeletal:     Cervical back: Neck supple.     Right lower leg: No edema.     Left lower leg: No edema.  Skin:    Findings: No rash.  Neurological:     Mental Status: He is alert and oriented to person, place, and time.     Motor: No weakness.  Psychiatric:        Mood and Affect: Mood normal.        Behavior: Behavior normal.   Patient has a swelling of the both legs.  He also has an ulcer on the medial side of the right leg which is not feeling.  So he was sent to the wound clinic.  BP (!) 150/76   Pulse 78   Ht 5\' 6"  (1.676 m)   Wt 225 lb (102.1 kg)   SpO2 (!) 87% Comment: 2 liters  BMI 36.32 kg/m  Wt Readings from Last 3 Encounters:  08/03/20 225 lb (102.1 kg)  07/19/20 225 lb 4.8 oz (102.2 kg)  07/13/20 220 lb 6.4 oz (100 kg)     Health Maintenance Due  Topic Date Due  . Hepatitis C Screening  Never done  . FOOT EXAM  Never done  . OPHTHALMOLOGY EXAM  Never done  . TETANUS/TDAP  Never done  . COLONOSCOPY  Never done  . HEMOGLOBIN A1C  08/07/2019    There are no preventive care reminders to display for this patient.  No results found for: TSH Lab Results  Component Value Date   WBC 10.1 07/03/2019   HGB 14.9 07/03/2019   HCT 46.3 07/03/2019   MCV 94.5 07/03/2019   PLT 215 07/03/2019   Lab Results  Component Value Date   NA 136 07/03/2019   K 4.0 07/03/2019   CO2 26 07/03/2019   GLUCOSE 195 (H) 07/03/2019   BUN 26 (H) 07/03/2019   CREATININE 0.92 07/03/2019   BILITOT 0.6 07/03/2019   ALKPHOS 76 07/03/2019   AST 23 07/03/2019   ALT 26 07/03/2019   PROT 7.6 07/03/2019   ALBUMIN 4.3 07/03/2019   CALCIUM 8.9 07/03/2019   ANIONGAP 9 07/03/2019   No  results found for: CHOL No results found for: HDL No results found for: LDLCALC No results found for: TRIG No results found for: CHOLHDL Lab Results  Component Value Date   HGBA1C 7.3 (H) 02/05/2019      Assessment & Plan:   Problem List Items Addressed This Visit      Cardiovascular and Mediastinum   Essential hypertension   Relevant Medications   furosemide (LASIX) 20 MG tablet     Respiratory   Chronic respiratory failure with hypoxia ([place])    Patient has a COPD and cor pulmonale also has a swollen both legs.  I started him on Lasix 40 mg p.o. daily        Endocrine   Diabetes 1.5, managed as type 2 ([place])    Patient was advised to follow antidiabetic diet and lose weight.  Blood sugar high today        Other   Edema - Primary    We will start patient on Lasix 40 mg daily      Relevant Medications   furosemide (LASIX) 20 MG tablet   Obesity (BMI 30.0-34.9)    - I encouraged the patient to lose weight.  - I educated them on making healthy dietary choices including eating  more fruits and vegetables and less fried foods. - I encouraged the patient to exercise more, and educated on the benefits of exercise including weight loss, diabetes prevention, and hypertension prevention.        Cellulitis of right lower extremity    Patient has a nonhealing wound in the right leg.  I started her on SCDs he was advised to lose Ace bandages both legs.  He he will be referred to the wound clinic for further evaluation.      Relevant Orders   Ambulatory referral to Wound Clinic      Meds ordered this encounter  Medications  . furosemide (LASIX) 20 MG tablet    Sig: Take 2 tablets (40 mg total) by mouth daily.    Dispense:  90 tablet    Refill:  4  . traMADol (ULTRAM) 50 MG tablet    Sig: Take 1 tablet (50 mg total) by mouth every 8 (eight) hours as needed for up to 5 days.    Dispense:  15 tablet    Refill:  0    Follow-up: No follow-ups on file.    [name],  MD

## 2020-08-03 NOTE — Assessment & Plan Note (Signed)
Patient has a nonhealing wound in the right leg.  I started her on SCDs he was advised to lose Ace bandages both legs.  He he will be referred to the wound clinic for further evaluation.

## 2020-08-12 ENCOUNTER — Other Ambulatory Visit: Payer: Self-pay | Admitting: *Deleted

## 2020-08-12 MED ORDER — TRAMADOL HCL 50 MG PO TABS
50.0000 mg | ORAL_TABLET | Freq: Two times a day (BID) | ORAL | 0 refills | Status: AC
Start: 2020-08-12 — End: 2020-09-11

## 2020-08-16 ENCOUNTER — Other Ambulatory Visit: Payer: Self-pay

## 2020-08-16 ENCOUNTER — Ambulatory Visit (INDEPENDENT_AMBULATORY_CARE_PROVIDER_SITE_OTHER): Payer: Medicare HMO | Admitting: Nurse Practitioner

## 2020-08-16 ENCOUNTER — Encounter (INDEPENDENT_AMBULATORY_CARE_PROVIDER_SITE_OTHER): Payer: Self-pay | Admitting: Nurse Practitioner

## 2020-08-16 VITALS — BP 142/66 | HR 103 | Resp 17 | Wt 212.0 lb

## 2020-08-16 DIAGNOSIS — R609 Edema, unspecified: Secondary | ICD-10-CM | POA: Diagnosis not present

## 2020-08-16 DIAGNOSIS — I10 Essential (primary) hypertension: Secondary | ICD-10-CM

## 2020-08-16 DIAGNOSIS — I5082 Biventricular heart failure: Secondary | ICD-10-CM

## 2020-08-16 NOTE — Progress Notes (Signed)
Subjective:    Patient ID: Julian Sparks, male    DOB: July 18, 1946, 74 y.o.   MRN: [id number] Chief Complaint  Patient presents with  . Follow-up    Ref Masoud rle cellulitits    Julian Sparks is a 74 year old male that presents today on referral from his primary care physician in regards to lower extremity edema and recent cellulitis.  The patient was recently bit by his dog on his right lower extremity and continues to have wound in place.  However prior to his visit today the patient had evidence of cellulitis.  The patient notes that he was placed on antibiotics and the redness is resolved as has the pain and discomfort.  However the patient notes that his legs are very swollen.  The patient notes that he has tried to utilize medical grade compression stockings previously however they were too tight and very painful.  Currently the patient is on home O2.  He denies any fever, chills, nausea, vomiting or diarrhea.  He denies any chest pain or shortness of breath.   Review of Systems  Respiratory: Positive for shortness of breath.   Cardiovascular: Positive for leg swelling.  Skin: Positive for wound.  All other systems reviewed and are negative.      Objective:   Physical Exam Vitals reviewed.  HENT:     Head: Normocephalic.  Cardiovascular:     Rate and Rhythm: Normal rate.     Pulses: Normal pulses.  Pulmonary:     Comments: Home 02 Musculoskeletal:     Right lower leg: 2+ Edema present.     Left lower leg: 2+ Edema present.  Skin:    General: Skin is warm and dry.  Neurological:     Mental Status: He is alert and oriented to person, place, and time.  Psychiatric:        Mood and Affect: Mood normal.        Behavior: Behavior normal.        Thought Content: Thought content normal.        Judgment: Judgment normal.     BP (!) 142/66 (BP Location: Right Arm)   Pulse (!) 103   Resp 17   Wt 212 lb (96.2 kg)   BMI 34.22 kg/m   Past Medical History:  Diagnosis Date   . Chronic bronchitis ([place])   . COPD (chronic obstructive pulmonary disease) ([place])   . Diabetes mellitus without complication ([place])   . Diastolic dysfunction   . Dyspnea    with exertion  . Hypertension     Social History   Socioeconomic History  . Marital status: Widowed    Spouse name: Not on file  . Number of children: 2  . Years of education: Not on file  . Highest education level: Not on file  Occupational History  . Not on file  Tobacco Use  . Smoking status: Former Smoker    Quit date: 03/27/2011    Years since quitting: 9.3  . Smokeless tobacco: Current User    Types: Chew  Substance and Sexual Activity  . Alcohol use: No  . Drug use: No  . Sexual activity: Not on file  Other Topics Concern  . Not on file  Social History Narrative  . Not on file   Social Determinants of Health   Financial Resource Strain: Not on file  Food Insecurity: Not on file  Transportation Needs: Not on file  Physical Activity: Not on file  Stress: Not on  file  Social Connections: Not on file  Intimate Partner Violence: Not on file    Past Surgical History:  Procedure Laterality Date  . CIRCUMCISION    . SHOULDER ARTHROSCOPY WITH OPEN ROTATOR CUFF REPAIR Left 06/20/2016   Procedure: SHOULDER ARTHROSCOPY WITH OPEN ROTATOR CUFF REPAIR;  Surgeon: Earnestine Leys, MD;  Location: [facility] ORS;  Service: Orthopedics;  Laterality: Left;  . UPPER GI ENDOSCOPY      Family History  Problem Relation Age of Onset  . Hypertension Mother     Allergies  Allergen Reactions  . Aspirin Nausea Only and Other (See Comments)    325 mg aspirin cause stomach upset  . Penicillins Rash    CBC Latest Ref Rng & Units 07/03/2019 03/27/2019 02/05/2019  WBC 4.0 - 10.5 K/uL 10.1 10.4 12.8(H)  Hemoglobin 13.0 - 17.0 g/dL 14.9 15.1 16.1  Hematocrit 39.0 - 52.0 % 46.3 46.6 48.5  Platelets 150 - 400 K/uL 215 209 224      CMP     Component Value Date/Time   NA 136 07/03/2019 1027   NA 134 (L) 02/11/2012  1706   K 4.0 07/03/2019 1027   K 4.1 02/11/2012 1706   CL 101 07/03/2019 1027   CL 95 (L) 02/11/2012 1706   CO2 26 07/03/2019 1027   CO2 29 02/11/2012 1706   GLUCOSE 195 (H) 07/03/2019 1027   GLUCOSE 179 (H) 02/11/2012 1706   BUN 26 (H) 07/03/2019 1027   BUN 12 02/11/2012 1706   CREATININE 0.92 07/03/2019 1027   CREATININE 0.87 02/11/2012 1706   CALCIUM 8.9 07/03/2019 1027   CALCIUM 9.8 02/11/2012 1706   PROT 7.6 07/03/2019 1027   PROT 7.9 02/11/2012 1706   ALBUMIN 4.3 07/03/2019 1027   ALBUMIN 4.1 02/11/2012 1706   AST 23 07/03/2019 1027   AST 20 02/11/2012 1706   ALT 26 07/03/2019 1027   ALT 45 02/11/2012 1706   ALKPHOS 76 07/03/2019 1027   ALKPHOS 109 02/11/2012 1706   BILITOT 0.6 07/03/2019 1027   BILITOT 0.7 02/11/2012 1706   GFRNONAA >60 07/03/2019 1027   GFRNONAA >60 02/11/2012 1706   GFRAA >60 07/03/2019 1027   GFRAA >60 02/11/2012 1706     No results found.     Assessment & Plan:   1. Edema, unspecified type No surgery or intervention at this point in time.    I have had a long discussion with the patient regarding venous insufficiency and why it  causes symptoms, specifically venous ulceration . I have discussed with the patient the chronic skin changes that accompany venous insufficiency and the long term sequela such as infection and recurring  ulceration.  Patient will be placed in [employer] which will be changed weekly drainage permitting.  In addition, behavioral modification including several periods of elevation of the lower extremities during the day will be continued. Achieving a position with the ankles at heart level was stressed to the patient  The patient is instructed to begin routine exercise, especially walking on a daily basis  Following the review of the ultrasound the patient will follow up in 4 weeks to reassess the degree of swelling and the control that Unna therapy is offering.   The patient can be assessed for graduated compression  stockings or wraps as well as a Lymph Pump once the ulcers are healed.   2. Essential hypertension Continue antihypertensive medications as already ordered, these medications have been reviewed and there are no changes at this time.  3. Biventricular congestive heart failure ([place]) This is certainly contributing to the patient's lower extremity edema however his primary care recently started him on Lasix so hopefully this will help with his fluid status as well.   Current Outpatient Medications on File Prior to Visit  Medication Sig Dispense Refill  . ACCU-CHEK AVIVA PLUS test strip TEST BLOOD SUGAR TWICE DAILY 200 strip 6  . Accu-Chek Softclix Lancets lancets     . albuterol (VENTOLIN HFA) 108 (90 Base) MCG/ACT inhaler Inhale 2 puffs into the lungs as needed for wheezing or shortness of breath. 18 g 3  . Alcohol Swabs (B-D SINGLE USE SWABS REGULAR) PADS Use to clean finger to check blood sugar 300 each 3  . aspirin 81 MG EC tablet Take 81 mg by mouth daily. Swallow whole.     Marland Kitchen atorvastatin (LIPITOR) 40 MG tablet TAKE 1 TABLET EVERY DAY 90 tablet 3  . B Complex-C (SUPER B COMPLEX PO) Take 1 tablet by mouth daily.     . cyclobenzaprine (FLEXERIL) 10 MG tablet Take 10 mg by mouth at bedtime.     . fluticasone (FLONASE) 50 MCG/ACT nasal spray Place 1 spray into both nostrils 2 (two) times a day.     . furosemide (LASIX) 20 MG tablet Take 2 tablets (40 mg total) by mouth daily. 90 tablet 4  . gabapentin (NEURONTIN) 300 MG capsule Take 1 capsule (300 mg total) by mouth 3 (three) times daily. 90 capsule 3  . glipiZIDE (GLUCOTROL) 5 MG tablet Take 5 mg by mouth 2 (two) times daily before a meal.     . losartan-hydrochlorothiazide (HYZAAR) 100-25 MG tablet TAKE 1 TABLET EVERY DAY 90 tablet 3  . metFORMIN (GLUCOPHAGE) 500 MG tablet TAKE 1 TABLET TWICE DAILY 180 tablet 3  . Omega-3 Fatty Acids (FISH OIL PO) Take 2 capsules by mouth 2 (two) times daily.     . SYMBICORT 160-4.5 MCG/ACT inhaler  INHALE 2 PUFFS TWICE DAILY 1 Inhaler 6  . traMADol (ULTRAM) 50 MG tablet Take 1 tablet (50 mg total) by mouth 2 (two) times daily. 60 tablet 0  . traZODone (DESYREL) 50 MG tablet TAKE 2 TABLETS EVERY DAY 180 tablet 3  . vitamin C (ASCORBIC ACID) 500 MG tablet Take 500 mg by mouth daily.     No current facility-administered medications on file prior to visit.    There are no Patient Instructions on file for this visit. No follow-ups on file.   Kris Hartmann, NP

## 2020-08-23 ENCOUNTER — Other Ambulatory Visit: Payer: Self-pay

## 2020-08-23 ENCOUNTER — Encounter (INDEPENDENT_AMBULATORY_CARE_PROVIDER_SITE_OTHER): Payer: Self-pay

## 2020-08-23 ENCOUNTER — Ambulatory Visit (INDEPENDENT_AMBULATORY_CARE_PROVIDER_SITE_OTHER): Payer: Medicare HMO | Admitting: Nurse Practitioner

## 2020-08-23 VITALS — BP 147/71 | HR 89 | Resp 16 | Wt 211.6 lb

## 2020-08-23 DIAGNOSIS — R609 Edema, unspecified: Secondary | ICD-10-CM

## 2020-08-23 NOTE — Progress Notes (Signed)
History of Present Illness  There is no documented history at this time  Assessments & Plan   There are no diagnoses linked to this encounter.    Additional instructions  Subjective:  Patient presents with venous ulcer of the Bilateral lower extremity.    Procedure:  3 layer unna wrap was placed Bilateral lower extremity.   Plan:   Follow up in one week.  

## 2020-08-24 ENCOUNTER — Other Ambulatory Visit: Payer: Self-pay | Admitting: Internal Medicine

## 2020-08-30 ENCOUNTER — Other Ambulatory Visit: Payer: Self-pay

## 2020-08-30 ENCOUNTER — Ambulatory Visit (INDEPENDENT_AMBULATORY_CARE_PROVIDER_SITE_OTHER): Payer: Medicare HMO | Admitting: Vascular Surgery

## 2020-08-30 VITALS — BP 139/79 | HR 89 | Resp 16 | Ht 66.0 in | Wt 212.0 lb

## 2020-08-30 DIAGNOSIS — R600 Localized edema: Secondary | ICD-10-CM | POA: Diagnosis not present

## 2020-08-30 DIAGNOSIS — L03115 Cellulitis of right lower limb: Secondary | ICD-10-CM | POA: Diagnosis not present

## 2020-08-30 DIAGNOSIS — R609 Edema, unspecified: Secondary | ICD-10-CM

## 2020-08-30 NOTE — Progress Notes (Signed)
History of Present Illness  There is no documented history at this time  Assessments & Plan   There are no diagnoses linked to this encounter.    Additional instructions  Subjective:  Patient presents with venous ulcer of Bilateral lower extremities.    Procedure:  3 layer unna wrap was placed on Bilateral lower extremities.   Plan:   Follow up in one week.

## 2020-08-31 ENCOUNTER — Encounter: Payer: Self-pay | Admitting: Internal Medicine

## 2020-08-31 ENCOUNTER — Ambulatory Visit (INDEPENDENT_AMBULATORY_CARE_PROVIDER_SITE_OTHER): Payer: Medicare HMO | Admitting: Internal Medicine

## 2020-08-31 VITALS — BP 118/60 | HR 61 | Ht 66.0 in | Wt 212.0 lb

## 2020-08-31 DIAGNOSIS — R609 Edema, unspecified: Secondary | ICD-10-CM | POA: Diagnosis not present

## 2020-08-31 DIAGNOSIS — E139 Other specified diabetes mellitus without complications: Secondary | ICD-10-CM | POA: Diagnosis not present

## 2020-08-31 DIAGNOSIS — J9611 Chronic respiratory failure with hypoxia: Secondary | ICD-10-CM

## 2020-08-31 DIAGNOSIS — I10 Essential (primary) hypertension: Secondary | ICD-10-CM

## 2020-08-31 DIAGNOSIS — E669 Obesity, unspecified: Secondary | ICD-10-CM | POA: Diagnosis not present

## 2020-08-31 NOTE — Assessment & Plan Note (Signed)
Patient does not smoke or drink.  He complains of cough and congestion, hacking cough is bothering him too much so he was given Tussionex half a teaspoonful twice a day

## 2020-08-31 NOTE — Assessment & Plan Note (Signed)
.  -   I encouraged the patient to eat a low-sodium diet to help control blood pressure. - I encouraged the patient to live an active lifestyle and complete activities that increases heart rate to 85% target heart rate at least 5 times per week for one hour.     

## 2020-08-31 NOTE — Assessment & Plan Note (Signed)
-   I encouraged the patient to regularly check blood sugar.  - I encouraged the patient to monitor diet. I encouraged the patient to eat low-carb and low-sugar to help prevent blood sugar spikes.  - I encouraged the patient to continue following their prescribed treatment plan for diabetes - I informed the patient to get help if blood sugar drops below 54mg/dL, or if suddenly have trouble thinking clearly or breathing.     

## 2020-08-31 NOTE — Assessment & Plan Note (Signed)
-   I encouraged the patient to lose weight.  - I educated them on making healthy dietary choices including eating more fruits and vegetables and less fried foods. - I encouraged the patient to exercise more, and educated on the benefits of exercise including weight loss, diabetes management, and hypertension management.

## 2020-08-31 NOTE — Assessment & Plan Note (Signed)
Swelling of the leg is better patient is wearinguna boots

## 2020-08-31 NOTE — Progress Notes (Signed)
Established Patient Office Visit  Subjective:  Patient ID: Julian Sparks, male    DOB: 1946-03-28  Age: 75 y.o. MRN: [id number]  CC:  Chief Complaint  Patient presents with  . Cough    Cough This is a recurrent problem. The current episode started in the past 7 days. The problem has been gradually worsening. The problem occurs constantly. The cough is non-productive. Associated symptoms include myalgias. Pertinent negatives include no chest pain, chills, ear pain, fever, headaches, hemoptysis, nasal congestion, sweats, weight loss or wheezing. He has tried a beta-agonist inhaler, OTC cough suppressant and prescription cough suppressant for the symptoms. His past medical history is significant for bronchitis, COPD and emphysema. There is no history of asthma, bronchiectasis or pneumonia.    Julian Sparks presents for check up  Past Medical History:  Diagnosis Date  . Chronic bronchitis ([place])   . COPD (chronic obstructive pulmonary disease) ([place])   . Diabetes mellitus without complication ([place])   . Diastolic dysfunction   . Dyspnea    with exertion  . Hypertension     Past Surgical History:  Procedure Laterality Date  . CIRCUMCISION    . SHOULDER ARTHROSCOPY WITH OPEN ROTATOR CUFF REPAIR Left 06/20/2016   Procedure: SHOULDER ARTHROSCOPY WITH OPEN ROTATOR CUFF REPAIR;  Surgeon: Earnestine Leys, MD;  Location: [facility] ORS;  Service: Orthopedics;  Laterality: Left;  . UPPER GI ENDOSCOPY      Family History  Problem Relation Age of Onset  . Hypertension Mother     Social History   Socioeconomic History  . Marital status: Widowed    Spouse name: Not on file  . Number of children: 2  . Years of education: Not on file  . Highest education level: Not on file  Occupational History  . Not on file  Tobacco Use  . Smoking status: Former Smoker    Quit date: 03/27/2011    Years since quitting: 9.4  . Smokeless tobacco: Current User    Types: Chew  Substance and Sexual Activity   . Alcohol use: No  . Drug use: No  . Sexual activity: Not on file  Other Topics Concern  . Not on file  Social History Narrative  . Not on file   Social Determinants of Health   Financial Resource Strain: Not on file  Food Insecurity: Not on file  Transportation Needs: Not on file  Physical Activity: Not on file  Stress: Not on file  Social Connections: Not on file  Intimate Partner Violence: Not on file     Current Outpatient Medications:  .  ACCU-CHEK AVIVA PLUS test strip, TEST BLOOD SUGAR TWICE DAILY, Disp: 200 strip, Rfl: 6 .  Accu-Chek Softclix Lancets lancets, , Disp: , Rfl:  .  albuterol (VENTOLIN HFA) 108 (90 Base) MCG/ACT inhaler, Inhale 2 puffs into the lungs as needed for wheezing or shortness of breath., Disp: 18 g, Rfl: 3 .  Alcohol Swabs (B-D SINGLE USE SWABS REGULAR) PADS, Use to clean finger to check blood sugar, Disp: 300 each, Rfl: 3 .  aspirin 81 MG EC tablet, Take 81 mg by mouth daily. Swallow whole. , Disp: , Rfl:  .  atorvastatin (LIPITOR) 40 MG tablet, TAKE 1 TABLET EVERY DAY, Disp: 90 tablet, Rfl: 3 .  B Complex-C (SUPER B COMPLEX PO), Take 1 tablet by mouth daily. , Disp: , Rfl:  .  cyclobenzaprine (FLEXERIL) 10 MG tablet, Take 10 mg by mouth at bedtime. , Disp: , Rfl:  .  fluticasone (FLONASE) 50 MCG/ACT nasal spray, Place 1 spray into both nostrils 2 (two) times a day. , Disp: , Rfl:  .  furosemide (LASIX) 20 MG tablet, Take 2 tablets (40 mg total) by mouth daily., Disp: 90 tablet, Rfl: 4 .  gabapentin (NEURONTIN) 300 MG capsule, Take 1 capsule (300 mg total) by mouth 3 (three) times daily., Disp: 90 capsule, Rfl: 3 .  glipiZIDE (GLUCOTROL XL) 5 MG 24 hr tablet, TAKE 1 TABLET TWICE DAILY, Disp: 180 tablet, Rfl: 1 .  glipiZIDE (GLUCOTROL) 5 MG tablet, Take 5 mg by mouth 2 (two) times daily before a meal. , Disp: , Rfl:  .  losartan-hydrochlorothiazide (HYZAAR) 100-25 MG tablet, TAKE 1 TABLET EVERY DAY, Disp: 90 tablet, Rfl: 3 .  metFORMIN (GLUCOPHAGE)  500 MG tablet, TAKE 1 TABLET TWICE DAILY, Disp: 180 tablet, Rfl: 3 .  Omega-3 Fatty Acids (FISH OIL PO), Take 2 capsules by mouth 2 (two) times daily. , Disp: , Rfl:  .  SYMBICORT 160-4.5 MCG/ACT inhaler, INHALE 2 PUFFS TWICE DAILY, Disp: 1 Inhaler, Rfl: 6 .  traMADol (ULTRAM) 50 MG tablet, Take 1 tablet (50 mg total) by mouth 2 (two) times daily., Disp: 60 tablet, Rfl: 0 .  traZODone (DESYREL) 50 MG tablet, TAKE 2 TABLETS EVERY DAY, Disp: 180 tablet, Rfl: 3 .  vitamin C (ASCORBIC ACID) 500 MG tablet, Take 500 mg by mouth daily., Disp: , Rfl:    Allergies  Allergen Reactions  . Aspirin Nausea Only and Other (See Comments)    325 mg aspirin cause stomach upset  . Penicillins Rash    ROS Review of Systems  Constitutional: Negative.  Negative for chills, fever and weight loss.  HENT: Negative.  Negative for ear pain.   Eyes: Negative.   Respiratory: Positive for cough. Negative for hemoptysis and wheezing.   Cardiovascular: Negative.  Negative for chest pain.  Gastrointestinal: Negative.   Endocrine: Negative.   Genitourinary: Negative.   Musculoskeletal: Positive for myalgias.  Skin: Negative.   Allergic/Immunologic: Negative.   Neurological: Negative.  Negative for headaches.  Hematological: Negative.   Psychiatric/Behavioral: Negative.   All other systems reviewed and are negative.     Objective:    Physical Exam Vitals reviewed.  Constitutional:      Appearance: Normal appearance.  HENT:     Mouth/Throat:     Mouth: Mucous membranes are moist.  Eyes:     Pupils: Pupils are equal, round, and reactive to light.  Neck:     Vascular: No carotid bruit.  Cardiovascular:     Rate and Rhythm: Normal rate and regular rhythm.     Pulses: Normal pulses.     Heart sounds: Normal heart sounds. No murmur heard.   Pulmonary:     Effort: Pulmonary effort is normal.     Breath sounds: Rhonchi present.  Abdominal:     General: Bowel sounds are normal.     Palpations: Abdomen  is soft. There is no hepatomegaly, splenomegaly or mass.     Tenderness: There is no abdominal tenderness.     Hernia: No hernia is present.  Musculoskeletal:     Cervical back: Neck supple. No rigidity.     Right lower leg: Edema present.     Left lower leg: Edema present.  Lymphadenopathy:     Cervical: No cervical adenopathy.  Skin:    Findings: No rash.  Neurological:     General: No focal deficit present.     Mental Status: He is alert  and oriented to person, place, and time.     Motor: No weakness.  Psychiatric:        Mood and Affect: Mood normal.        Behavior: Behavior normal.     There were no vitals taken for this visit. Wt Readings from Last 3 Encounters:  08/30/20 212 lb (96.2 kg)  08/23/20 211 lb 9.6 oz (96 kg)  08/16/20 212 lb (96.2 kg)     Health Maintenance Due  Topic Date Due  . Hepatitis C Screening  Never done  . FOOT EXAM  Never done  . OPHTHALMOLOGY EXAM  Never done  . TETANUS/TDAP  Never done  . COLONOSCOPY (Pts 45-45yrs Insurance coverage will need to be confirmed)  Never done  . HEMOGLOBIN A1C  08/07/2019  . COVID-19 Vaccine (3 - Pfizer risk 4-dose series) 01/13/2020    There are no preventive care reminders to display for this patient.  No results found for: TSH Lab Results  Component Value Date   WBC 10.1 07/03/2019   HGB 14.9 07/03/2019   HCT 46.3 07/03/2019   MCV 94.5 07/03/2019   PLT 215 07/03/2019   Lab Results  Component Value Date   NA 136 07/03/2019   K 4.0 07/03/2019   CO2 26 07/03/2019   GLUCOSE 195 (H) 07/03/2019   BUN 26 (H) 07/03/2019   CREATININE 0.92 07/03/2019   BILITOT 0.6 07/03/2019   ALKPHOS 76 07/03/2019   AST 23 07/03/2019   ALT 26 07/03/2019   PROT 7.6 07/03/2019   ALBUMIN 4.3 07/03/2019   CALCIUM 8.9 07/03/2019   ANIONGAP 9 07/03/2019   No results found for: CHOL No results found for: HDL No results found for: LDLCALC No results found for: TRIG No results found for: CHOLHDL Lab Results   Component Value Date   HGBA1C 7.3 (H) 02/05/2019      Assessment & Plan:   Problem List Items Addressed This Visit      Cardiovascular and Mediastinum   Essential hypertension    - .  - I encouraged the patient to eat a low-sodium diet to help control blood pressure. - I encouraged the patient to live an active lifestyle and complete activities that increases heart rate to 85% target heart rate at least 5 times per week for one hour.            Respiratory   Chronic respiratory failure with hypoxia ([place])    Patient does not smoke or drink.  He complains of cough and congestion, hacking cough is bothering him too much so he was given Tussionex half a teaspoonful twice a day          Endocrine   Diabetes 1.5, managed as type 2 ([place])    -.  - I encouraged the patient to regularly check blood sugar.  - I encouraged the patient to monitor diet. I encouraged the patient to eat low-carb and low-sugar to help prevent blood sugar spikes.  - I encouraged the patient to continue following their prescribed treatment plan for diabetes - I informed the patient to get help if blood sugar drops below 54mg /dL, or if suddenly have trouble thinking clearly or breathing.            Other   Edema - Primary    Swelling of the leg is better patient is wearinguna boots      Obesity (BMI 30.0-34.9)    - I encouraged the patient to lose weight.  - I  educated them on making healthy dietary choices including eating more fruits and vegetables and less fried foods. - I encouraged the patient to exercise more, and educated on the benefits of exercise including weight loss, diabetes management, and hypertension management.           No orders of the defined types were placed in this encounter.   Follow-up: No follow-ups on file.    Patient complaining of cough wheezing shortness of breath he is on oxygen.  Cough is bothering him too much.  Given Tussionex half a teaspoon twice a day.  He  does not smoke does not drink.  He is on oxygen 24 hours a day..  Patient was advised to lose weight follow low-cholesterol diet.   [name], MD

## 2020-09-06 ENCOUNTER — Encounter (INDEPENDENT_AMBULATORY_CARE_PROVIDER_SITE_OTHER): Payer: Medicare HMO

## 2020-09-13 ENCOUNTER — Other Ambulatory Visit: Payer: Self-pay | Admitting: *Deleted

## 2020-09-13 ENCOUNTER — Ambulatory Visit (INDEPENDENT_AMBULATORY_CARE_PROVIDER_SITE_OTHER): Payer: Medicare HMO | Admitting: Nurse Practitioner

## 2020-09-13 MED ORDER — GABAPENTIN 300 MG PO CAPS
300.0000 mg | ORAL_CAPSULE | Freq: Three times a day (TID) | ORAL | 3 refills | Status: DC
Start: 2020-09-13 — End: 2020-09-13

## 2020-09-13 MED ORDER — LEVOFLOXACIN 500 MG PO TABS: 500.0000 mg | ORAL_TABLET | Freq: Every day | ORAL | 0 refills | Status: DC

## 2020-09-13 MED ORDER — GABAPENTIN 300 MG PO CAPS
300.0000 mg | ORAL_CAPSULE | Freq: Three times a day (TID) | ORAL | 0 refills | Status: DC
Start: 2020-09-13 — End: 2021-08-29

## 2020-09-26 DIAGNOSIS — J441 Chronic obstructive pulmonary disease with (acute) exacerbation: Secondary | ICD-10-CM | POA: Diagnosis not present

## 2020-09-26 DIAGNOSIS — Z9981 Dependence on supplemental oxygen: Secondary | ICD-10-CM | POA: Diagnosis not present

## 2020-09-26 DIAGNOSIS — Z7952 Long term (current) use of systemic steroids: Secondary | ICD-10-CM | POA: Diagnosis not present

## 2020-09-27 ENCOUNTER — Ambulatory Visit (INDEPENDENT_AMBULATORY_CARE_PROVIDER_SITE_OTHER): Payer: Medicare HMO | Admitting: Nurse Practitioner

## 2020-10-04 DIAGNOSIS — J449 Chronic obstructive pulmonary disease, unspecified: Secondary | ICD-10-CM | POA: Diagnosis not present

## 2020-10-26 ENCOUNTER — Other Ambulatory Visit: Payer: Self-pay

## 2020-10-26 ENCOUNTER — Encounter: Payer: Self-pay | Admitting: Internal Medicine

## 2020-10-26 ENCOUNTER — Ambulatory Visit (INDEPENDENT_AMBULATORY_CARE_PROVIDER_SITE_OTHER): Payer: Medicare HMO | Admitting: Internal Medicine

## 2020-10-26 VITALS — BP 180/82 | HR 112 | Ht 66.0 in | Wt 216.2 lb

## 2020-10-26 DIAGNOSIS — I10 Essential (primary) hypertension: Secondary | ICD-10-CM

## 2020-10-26 DIAGNOSIS — M7989 Other specified soft tissue disorders: Secondary | ICD-10-CM | POA: Diagnosis not present

## 2020-10-26 DIAGNOSIS — J9611 Chronic respiratory failure with hypoxia: Secondary | ICD-10-CM | POA: Diagnosis not present

## 2020-10-26 DIAGNOSIS — M7502 Adhesive capsulitis of left shoulder: Secondary | ICD-10-CM | POA: Diagnosis not present

## 2020-10-26 DIAGNOSIS — R0602 Shortness of breath: Secondary | ICD-10-CM

## 2020-10-26 DIAGNOSIS — M7501 Adhesive capsulitis of right shoulder: Secondary | ICD-10-CM

## 2020-10-26 DIAGNOSIS — E139 Other specified diabetes mellitus without complications: Secondary | ICD-10-CM | POA: Diagnosis not present

## 2020-10-26 LAB — COMPLETE METABOLIC PANEL WITH GFR
AG Ratio: 1.8 (calc) (ref 1.0–2.5)
ALT: 20 U/L (ref 9–46)
AST: 19 U/L (ref 10–35)
Albumin: 4.2 g/dL (ref 3.6–5.1)
Alkaline phosphatase (APISO): 84 U/L (ref 35–144)
BUN: 19 mg/dL (ref 7–25)
CO2: 34 mmol/L — ABNORMAL HIGH (ref 20–32)
Calcium: 9 mg/dL (ref 8.6–10.3)
Chloride: 101 mmol/L (ref 98–110)
Creat: 0.72 mg/dL (ref 0.70–1.18)
GFR, Est African American: 105 mL/min/{1.73_m2} (ref >=60)
GFR, Est Non African American: 91 mL/min/{1.73_m2} (ref >=60)
Globulin: 2.3 g/dL (calc) (ref 1.9–3.7)
Glucose, Bld: 167 mg/dL — ABNORMAL HIGH (ref 65–99)
Potassium: 4.4 mmol/L (ref 3.5–5.3)
Sodium: 140 mmol/L (ref 135–146)
Total Bilirubin: 0.5 mg/dL (ref 0.2–1.2)
Total Protein: 6.5 g/dL (ref 6.1–8.1)

## 2020-10-26 LAB — CBC WITH DIFFERENTIAL/PLATELET
Absolute Monocytes: 639 cells/uL (ref 200–950)
Basophils Absolute: 36 cells/uL (ref 0–200)
Basophils Relative: 0.4 %
Eosinophils Absolute: 405 cells/uL (ref 15–500)
Eosinophils Relative: 4.5 %
HCT: 39.2 % (ref 38.5–50.0)
Hemoglobin: 13.2 g/dL (ref 13.2–17.1)
Lymphs Abs: 2592 cells/uL (ref 850–3900)
MCH: 30.9 pg (ref 27.0–33.0)
MCHC: 33.7 g/dL (ref 32.0–36.0)
MCV: 91.8 fL (ref 80.0–100.0)
MPV: 9.8 fL (ref 7.5–12.5)
Monocytes Relative: 7.1 %
Neutro Abs: 5328 cells/uL (ref 1500–7800)
Neutrophils Relative %: 59.2 %
Platelets: 239 10*3/uL (ref 140–400)
RBC: 4.27 10*6/uL (ref 4.20–5.80)
RDW: 14.6 % (ref 11.0–15.0)
Total Lymphocyte: 28.8 %
WBC: 9 10*3/uL (ref 3.8–10.8)

## 2020-10-26 MED ORDER — ALBUTEROL SULFATE HFA 108 (90 BASE) MCG/ACT IN AERS: 2.0000 | INHALATION_SPRAY | RESPIRATORY_TRACT | 3 refills | Status: DC | PRN

## 2020-10-26 MED ORDER — IPRATROPIUM-ALBUTEROL 0.5-2.5 (3) MG/3ML IN SOLN: 3.0000 mL | RESPIRATORY_TRACT | 6 refills | Status: DC | PRN

## 2020-10-26 MED ORDER — LEVOFLOXACIN 500 MG PO TABS: 500.0000 mg | ORAL_TABLET | Freq: Every day | ORAL | 0 refills | Status: DC

## 2020-10-26 MED ORDER — METHYLPREDNISOLONE 4 MG PO TBPK: ORAL_TABLET | ORAL | 1 refills | Status: DC

## 2020-10-26 NOTE — Assessment & Plan Note (Signed)
Patient does not smoke anymore.

## 2020-10-26 NOTE — Progress Notes (Signed)
Established Patient Office Visit  Subjective:  Patient ID: Julian Sparks, male    DOB: [birth year]  Age: 75 y.o. MRN: [id number]  CC:  Chief Complaint  Patient presents with  . post covid    Patient still having sob from covid and feels congested, also still having headaches     HPI  Julian Sparks presents for check up  Blood sugar is okay, patient has a history of Covid about 2 months ago,he did not feel very good, complains of shortness of breath, on exertion.  He also has been on oxygen 3 L/min.  Patient is known to have COPD and emphysema in the past.  He is overweight.  Does not have any temperature.  Patient is also known to have diabetes and shoulder problems. ,  Past Medical History:  Diagnosis Date  . Chronic bronchitis ([place])   . COPD (chronic obstructive pulmonary disease) ([place])   . Diabetes mellitus without complication ([place])   . Diastolic dysfunction   . Dyspnea    with exertion  . Hypertension     Past Surgical History:  Procedure Laterality Date  . CIRCUMCISION    . SHOULDER ARTHROSCOPY WITH OPEN ROTATOR CUFF REPAIR Left 06/20/2016   Procedure: SHOULDER ARTHROSCOPY WITH OPEN ROTATOR CUFF REPAIR;  Surgeon: Earnestine Leys, MD;  Location: [facility] ORS;  Service: Orthopedics;  Laterality: Left;  . UPPER GI ENDOSCOPY      Family History  Problem Relation Age of Onset  . Hypertension Mother     Social History   Socioeconomic History  . Marital status: Widowed    Spouse name: Not on file  . Number of children: 2  . Years of education: Not on file  . Highest education level: Not on file  Occupational History  . Not on file  Tobacco Use  . Smoking status: Former Smoker    Quit date: 03/27/2011    Years since quitting: 9.5  . Smokeless tobacco: Current User    Types: Chew  Substance and Sexual Activity  . Alcohol use: No  . Drug use: No  . Sexual activity: Not on file  Other Topics Concern  . Not on file  Social History Narrative  . Not on file    Social Determinants of Health   Financial Resource Strain: Not on file  Food Insecurity: Not on file  Transportation Needs: Not on file  Physical Activity: Not on file  Stress: Not on file  Social Connections: Not on file  Intimate Partner Violence: Not on file     Current Outpatient Medications:  .  ACCU-CHEK AVIVA PLUS test strip, TEST BLOOD SUGAR TWICE DAILY, Disp: 200 strip, Rfl: 6 .  Accu-Chek Softclix Lancets lancets, , Disp: , Rfl:  .  Alcohol Swabs (B-D SINGLE USE SWABS REGULAR) PADS, Use to clean finger to check blood sugar, Disp: 300 each, Rfl: 3 .  aspirin 81 MG EC tablet, Take 81 mg by mouth daily. Swallow whole. , Disp: , Rfl:  .  atorvastatin (LIPITOR) 40 MG tablet, TAKE 1 TABLET EVERY DAY, Disp: 90 tablet, Rfl: 3 .  B Complex-C (SUPER B COMPLEX PO), Take 1 tablet by mouth daily. , Disp: , Rfl:  .  cyclobenzaprine (FLEXERIL) 10 MG tablet, Take 10 mg by mouth at bedtime. , Disp: , Rfl:  .  fluticasone (FLONASE) 50 MCG/ACT nasal spray, Place 1 spray into both nostrils 2 (two) times a day. , Disp: , Rfl:  .  furosemide (LASIX) 20 MG tablet, Take  2 tablets (40 mg total) by mouth daily., Disp: 90 tablet, Rfl: 4 .  gabapentin (NEURONTIN) 300 MG capsule, Take 1 capsule (300 mg total) by mouth 3 (three) times daily., Disp: 90 capsule, Rfl: 0 .  glipiZIDE (GLUCOTROL XL) 5 MG 24 hr tablet, TAKE 1 TABLET TWICE DAILY, Disp: 180 tablet, Rfl: 1 .  glipiZIDE (GLUCOTROL) 5 MG tablet, Take 5 mg by mouth 2 (two) times daily before a meal. , Disp: , Rfl:  .  ipratropium-albuterol (DUONEB) 0.5-2.5 (3) MG/3ML SOLN, Take 3 mLs by nebulization every 4 (four) hours as needed., Disp: 258 each, Rfl: 6 .  levofloxacin (LEVAQUIN) 500 MG tablet, Take 1 tablet (500 mg total) by mouth daily., Disp: 7 tablet, Rfl: 0 .  levofloxacin (LEVAQUIN) 500 MG tablet, Take 1 tablet (500 mg total) by mouth daily., Disp: 7 tablet, Rfl: 0 .  losartan-hydrochlorothiazide (HYZAAR) 100-25 MG tablet, TAKE 1 TABLET EVERY  DAY, Disp: 90 tablet, Rfl: 3 .  metFORMIN (GLUCOPHAGE) 500 MG tablet, TAKE 1 TABLET TWICE DAILY, Disp: 180 tablet, Rfl: 3 .  methylPREDNISolone (MEDROL DOSEPAK) 4 MG TBPK tablet, Medrol Dosepak as directed 4 mg, Disp: 21 tablet, Rfl: 1 .  Omega-3 Fatty Acids (FISH OIL PO), Take 2 capsules by mouth 2 (two) times daily. , Disp: , Rfl:  .  SYMBICORT 160-4.5 MCG/ACT inhaler, INHALE 2 PUFFS TWICE DAILY, Disp: 1 Inhaler, Rfl: 6 .  traZODone (DESYREL) 50 MG tablet, TAKE 2 TABLETS EVERY DAY, Disp: 180 tablet, Rfl: 3 .  vitamin C (ASCORBIC ACID) 500 MG tablet, Take 500 mg by mouth daily., Disp: , Rfl:  .  albuterol (VENTOLIN HFA) 108 (90 Base) MCG/ACT inhaler, Inhale 2 puffs into the lungs as needed for wheezing or shortness of breath., Disp: 18 g, Rfl: 3   Allergies  Allergen Reactions  . Aspirin Nausea Only and Other (See Comments)    325 mg aspirin cause stomach upset  . Penicillins Rash    ROS Review of Systems  Constitutional: Negative.   HENT: Negative.   Eyes: Negative.   Respiratory: Negative.        Cough  wheezing  Cardiovascular: Negative.   Gastrointestinal: Negative.   Endocrine: Negative.   Genitourinary: Negative.   Musculoskeletal: Negative.   Skin: Negative.   Allergic/Immunologic: Negative.   Neurological: Negative.   Hematological: Negative.   Psychiatric/Behavioral: Negative.   All other systems reviewed and are negative.     Objective:    Physical Exam Vitals reviewed.  Constitutional:      Appearance: Normal appearance.  HENT:     Mouth/Throat:     Mouth: Mucous membranes are moist.  Eyes:     Pupils: Pupils are equal, round, and reactive to light.  Neck:     Vascular: No carotid bruit.  Cardiovascular:     Rate and Rhythm: Normal rate and regular rhythm.     Pulses: Normal pulses.     Heart sounds: Normal heart sounds.  Pulmonary:     Effort: Pulmonary effort is normal.     Breath sounds: Normal breath sounds.     Comments: Patient had a  decreased breath sounds bilaterally with occasional rhonchi.  There is 2+ pedal edema. Abdominal:     General: Bowel sounds are normal.     Palpations: Abdomen is soft. There is no hepatomegaly, splenomegaly or mass.     Tenderness: There is no abdominal tenderness.     Hernia: No hernia is present.  Musculoskeletal:     Cervical back:  Neck supple.     Right lower leg: No edema.     Left lower leg: No edema.  Skin:    Findings: No rash.  Neurological:     Mental Status: He is alert and oriented to person, place, and time.     Motor: No weakness.  Psychiatric:        Mood and Affect: Mood normal.        Behavior: Behavior normal.     BP (!) 180/82   Pulse (!) 112   Ht 5\' 6"  (1.676 m)   Wt 216 lb 3.2 oz (98.1 kg)   SpO2 (!) 87% Comment: 3 liters  BMI 34.90 kg/m  Wt Readings from Last 3 Encounters:  10/26/20 216 lb 3.2 oz (98.1 kg)  08/31/20 212 lb (96.2 kg)  08/30/20 212 lb (96.2 kg)     Health Maintenance Due  Topic Date Due  . Hepatitis C Screening  Never done  . FOOT EXAM  Never done  . OPHTHALMOLOGY EXAM  Never done  . TETANUS/TDAP  Never done  . HEMOGLOBIN A1C  08/07/2019  . COVID-19 Vaccine (3 - Pfizer risk 4-dose series) 01/13/2020    There are no preventive care reminders to display for this patient.  No results found for: TSH Lab Results  Component Value Date   WBC 10.1 07/03/2019   HGB 14.9 07/03/2019   HCT 46.3 07/03/2019   MCV 94.5 07/03/2019   PLT 215 07/03/2019   Lab Results  Component Value Date   NA 136 07/03/2019   K 4.0 07/03/2019   CO2 26 07/03/2019   GLUCOSE 195 (H) 07/03/2019   BUN 26 (H) 07/03/2019   CREATININE 0.92 07/03/2019   BILITOT 0.6 07/03/2019   ALKPHOS 76 07/03/2019   AST 23 07/03/2019   ALT 26 07/03/2019   PROT 7.6 07/03/2019   ALBUMIN 4.3 07/03/2019   CALCIUM 8.9 07/03/2019   ANIONGAP 9 07/03/2019   No results found for: CHOL No results found for: HDL No results found for: LDLCALC No results found for: TRIG No  results found for: CHOLHDL Lab Results  Component Value Date   HGBA1C 7.3 (H) 02/05/2019      Assessment & Plan:   Problem List Items Addressed This Visit      Cardiovascular and Mediastinum   Essential hypertension    Blood pressure is elevated today because of cough and bronchitis.        Respiratory   Chronic respiratory failure with hypoxia ([place])    Patient does not smoke anymore.      Relevant Medications   levofloxacin (LEVAQUIN) 500 MG tablet   methylPREDNISolone (MEDROL DOSEPAK) 4 MG TBPK tablet     Endocrine   Diabetes 1.5, managed as type 2 ([place])    - The patient's blood sugar is not under control on med. - The patient will continue the current treatment regimen.  - I encouraged the patient to regularly check blood sugar.  - I encouraged the patient to monitor diet. I encouraged the patient to eat low-carb and low-sugar to help prevent blood sugar spikes.  - I encouraged the patient to continue following their prescribed treatment plan for diabetes - I informed the patient to get help if blood sugar drops below 54mg /dL, or if suddenly have trouble thinking clearly or breathing.  We will check CBC and metabolic panel.           Musculoskeletal and Integument   Adhesive capsulitis of both shoulders  stable      Relevant Medications   methylPREDNISolone (MEDROL DOSEPAK) 4 MG TBPK tablet     Other   Leg swelling    Patient has 2+ swelling of the legs we will check he said that it goes up and down.      Shortness of breath - Primary    Patient complains of shortness of breath he has a Covid 2 months ago.  I will get a chest x-ray on him and as well as CBC and metabolic panel.  Patient was started on steroid and Levaquin.  We will also give him inhaled nebulizer treatment with DuoNeb.      Relevant Orders   DG Chest 2 View   CBC with Differential/Platelet   COMPLETE METABOLIC PANEL WITH GFR      Meds ordered this encounter  Medications  .  albuterol (VENTOLIN HFA) 108 (90 Base) MCG/ACT inhaler    Sig: Inhale 2 puffs into the lungs as needed for wheezing or shortness of breath.    Dispense:  18 g    Refill:  3  . ipratropium-albuterol (DUONEB) 0.5-2.5 (3) MG/3ML SOLN    Sig: Take 3 mLs by nebulization every 4 (four) hours as needed.    Dispense:  258 each    Refill:  6  . levofloxacin (LEVAQUIN) 500 MG tablet    Sig: Take 1 tablet (500 mg total) by mouth daily.    Dispense:  7 tablet    Refill:  0  . methylPREDNISolone (MEDROL DOSEPAK) 4 MG TBPK tablet    Sig: Medrol Dosepak as directed 4 mg    Dispense:  21 tablet    Refill:  1    Follow-up: No follow-ups on file.    [name], MD

## 2020-10-26 NOTE — Assessment & Plan Note (Signed)
Blood pressure is elevated today because of cough and bronchitis.

## 2020-10-26 NOTE — Assessment & Plan Note (Addendum)
-   The patient's blood sugar is not under control on med. - The patient will continue the current treatment regimen.  - I encouraged the patient to regularly check blood sugar.  - I encouraged the patient to monitor diet. I encouraged the patient to eat low-carb and low-sugar to help prevent blood sugar spikes.  - I encouraged the patient to continue following their prescribed treatment plan for diabetes - I informed the patient to get help if blood sugar drops below 54mg /dL, or if suddenly have trouble thinking clearly or breathing.  We will check CBC and metabolic panel.

## 2020-10-26 NOTE — Assessment & Plan Note (Signed)
Patient complains of shortness of breath he has a Covid 2 months ago.  I will get a chest x-ray on him and as well as CBC and metabolic panel.  Patient was started on steroid and Levaquin.  We will also give him inhaled nebulizer treatment with DuoNeb.

## 2020-10-26 NOTE — Assessment & Plan Note (Signed)
We will start the patient on DuoNeb nebulized treatment for breathing.  He was advised to continue using his oxygen 3 L/min 24 hours a day.  He has 2+ pedal edema.

## 2020-10-26 NOTE — Assessment & Plan Note (Signed)
stable °

## 2020-10-26 NOTE — Assessment & Plan Note (Signed)
Patient has 2+ swelling of the legs we will check he said that it goes up and down.

## 2020-10-28 ENCOUNTER — Other Ambulatory Visit: Payer: Self-pay | Admitting: Internal Medicine

## 2020-10-28 ENCOUNTER — Ambulatory Visit
Admission: RE | Admit: 2020-10-28 | Discharge: 2020-10-28 | Disposition: A | Discharge status: Home / Self Care | Payer: Medicare HMO | Source: Ambulatory Visit | Attending: Internal Medicine | Admitting: Internal Medicine

## 2020-10-28 ENCOUNTER — Ambulatory Visit
Admission: RE | Admit: 2020-10-28 | Discharge: 2020-10-28 | Disposition: A | Discharge status: Home / Self Care | Payer: Medicare HMO | Attending: Cardiology | Admitting: Cardiology

## 2020-10-28 ENCOUNTER — Other Ambulatory Visit: Payer: Self-pay

## 2020-10-28 DIAGNOSIS — I517 Cardiomegaly: Secondary | ICD-10-CM | POA: Diagnosis not present

## 2020-10-28 DIAGNOSIS — J439 Emphysema, unspecified: Secondary | ICD-10-CM | POA: Diagnosis not present

## 2020-10-28 DIAGNOSIS — R0602 Shortness of breath: Secondary | ICD-10-CM

## 2020-11-01 ENCOUNTER — Ambulatory Visit (INDEPENDENT_AMBULATORY_CARE_PROVIDER_SITE_OTHER): Payer: Medicare HMO | Admitting: Internal Medicine

## 2020-11-01 ENCOUNTER — Encounter: Payer: Self-pay | Admitting: Internal Medicine

## 2020-11-01 ENCOUNTER — Other Ambulatory Visit: Payer: Self-pay

## 2020-11-01 VITALS — BP 140/82 | HR 59 | Ht 66.0 in | Wt 216.2 lb

## 2020-11-01 DIAGNOSIS — R0602 Shortness of breath: Secondary | ICD-10-CM | POA: Diagnosis not present

## 2020-11-01 DIAGNOSIS — E669 Obesity, unspecified: Secondary | ICD-10-CM

## 2020-11-01 DIAGNOSIS — J9611 Chronic respiratory failure with hypoxia: Secondary | ICD-10-CM | POA: Diagnosis not present

## 2020-11-01 DIAGNOSIS — I10 Essential (primary) hypertension: Secondary | ICD-10-CM | POA: Diagnosis not present

## 2020-11-01 DIAGNOSIS — E139 Other specified diabetes mellitus without complications: Secondary | ICD-10-CM

## 2020-11-01 MED ORDER — BUDESONIDE-FORMOTEROL FUMARATE 160-4.5 MCG/ACT IN AERO: 2.0000 | INHALATION_SPRAY | Freq: Two times a day (BID) | RESPIRATORY_TRACT | 3 refills | Status: DC

## 2020-11-01 MED ORDER — ACCU-CHEK AVIVA PLUS VI STRP: ORAL_STRIP | 3 refills | Status: DC

## 2020-11-01 MED ORDER — ALBUTEROL SULFATE HFA 108 (90 BASE) MCG/ACT IN AERS: 2.0000 | INHALATION_SPRAY | RESPIRATORY_TRACT | 3 refills | Status: DC | PRN

## 2020-11-01 MED ORDER — ACCU-CHEK AVIVA PLUS W/DEVICE KIT: PACK | 3 refills | Status: AC

## 2020-11-01 MED ORDER — ACCU-CHEK SOFTCLIX LANCETS MISC: 3 refills | Status: DC

## 2020-11-01 NOTE — Assessment & Plan Note (Signed)
Patient blood pressure is normal patient denies any chest pain  shortness of breath  Is better, there is no history of palpitation paroxysmal nocturnal dyspnea patient can walk 2 blocks without any problem patient was advised to follow low-salt low-cholesterol diet

## 2020-11-01 NOTE — Assessment & Plan Note (Signed)
Patient is on oxygen 2 to 3 L/min.  His O2 saturation was 88% on oxygen.  He was advised to lose weight.  Patient does not smoke.

## 2020-11-01 NOTE — Assessment & Plan Note (Signed)
resp failure   Is stable

## 2020-11-01 NOTE — Assessment & Plan Note (Signed)

## 2020-11-01 NOTE — Assessment & Plan Note (Signed)
-   I encouraged the patient to lose weight.  - I educated them on making healthy dietary choices including eating more fruits and vegetables and less fried foods. - I encouraged the patient to exercise more, and educated on the benefits of exercise including weight loss, diabetes prevention, and hypertension prevention.   

## 2020-11-01 NOTE — Progress Notes (Signed)
Established Patient Office Visit  Subjective:  Patient ID: Julian Sparks, male    DOB: May 04, 1946  Age: 75 y.o. MRN: [id number]  CC:  Chief Complaint  Patient presents with  . Follow-up    Patient here today for 1 week follow up from shortness of breath. Patient states that the nebulizer has really helped and he is feeling much better.    . Diabetes    Patient's blood sugar is 106 this morning     HPI  Julian Sparks presents for general checkup.  Complains of shortness of breath  He  is known to diabetes.  Blood pressure is borderline high oxygen saturations 88%.  Patient is known to have COPD and diastolic dysfunction of the left ventricle  he complains of shortness of breath on exertion and has hypertension. Past Medical History:  Diagnosis Date  . Chronic bronchitis ([place])   . COPD (chronic obstructive pulmonary disease) ([place])   . Diabetes mellitus without complication ([place])   . Diastolic dysfunction   . Dyspnea    with exertion  . Hypertension     Past Surgical History:  Procedure Laterality Date  . CIRCUMCISION    . SHOULDER ARTHROSCOPY WITH OPEN ROTATOR CUFF REPAIR Left 06/20/2016   Procedure: SHOULDER ARTHROSCOPY WITH OPEN ROTATOR CUFF REPAIR;  Surgeon: Earnestine Leys, MD;  Location: [facility] ORS;  Service: Orthopedics;  Laterality: Left;  . UPPER GI ENDOSCOPY      Family History  Problem Relation Age of Onset  . Hypertension Mother     Social History   Socioeconomic History  . Marital status: Widowed    Spouse name: Not on file  . Number of children: 2  . Years of education: Not on file  . Highest education level: Not on file  Occupational History  . Not on file  Tobacco Use  . Smoking status: Former Smoker    Quit date: 03/27/2011    Years since quitting: 9.6  . Smokeless tobacco: Current User    Types: Chew  Substance and Sexual Activity  . Alcohol use: No  . Drug use: No  . Sexual activity: Not on file  Other Topics Concern  . Not on file   Social History Narrative  . Not on file   Social Determinants of Health   Financial Resource Strain: Not on file  Food Insecurity: Not on file  Transportation Needs: Not on file  Physical Activity: Not on file  Stress: Not on file  Social Connections: Not on file  Intimate Partner Violence: Not on file     Current Outpatient Medications:  .  Alcohol Swabs (B-D SINGLE USE SWABS REGULAR) PADS, Use to clean finger to check blood sugar, Disp: 300 each, Rfl: 3 .  aspirin 81 MG EC tablet, Take 81 mg by mouth daily. Swallow whole. , Disp: , Rfl:  .  atorvastatin (LIPITOR) 40 MG tablet, TAKE 1 TABLET EVERY DAY, Disp: 90 tablet, Rfl: 3 .  B Complex-C (SUPER B COMPLEX PO), Take 1 tablet by mouth daily. , Disp: , Rfl:  .  Blood Glucose Monitoring Suppl (ACCU-CHEK AVIVA PLUS) w/Device KIT, Check blood sugar BID, Disp: 1 kit, Rfl: 3 .  cyclobenzaprine (FLEXERIL) 10 MG tablet, Take 10 mg by mouth at bedtime. , Disp: , Rfl:  .  fluticasone (FLONASE) 50 MCG/ACT nasal spray, Place 1 spray into both nostrils 2 (two) times a day. , Disp: , Rfl:  .  furosemide (LASIX) 20 MG tablet, Take 2 tablets (40 mg  total) by mouth daily., Disp: 90 tablet, Rfl: 4 .  gabapentin (NEURONTIN) 300 MG capsule, Take 1 capsule (300 mg total) by mouth 3 (three) times daily., Disp: 90 capsule, Rfl: 0 .  glipiZIDE (GLUCOTROL XL) 5 MG 24 hr tablet, TAKE 1 TABLET TWICE DAILY, Disp: 180 tablet, Rfl: 1 .  glipiZIDE (GLUCOTROL) 5 MG tablet, Take 5 mg by mouth 2 (two) times daily before a meal. , Disp: , Rfl:  .  ipratropium-albuterol (DUONEB) 0.5-2.5 (3) MG/3ML SOLN, Take 3 mLs by nebulization every 4 (four) hours as needed., Disp: 258 each, Rfl: 6 .  levofloxacin (LEVAQUIN) 500 MG tablet, Take 1 tablet (500 mg total) by mouth daily., Disp: 7 tablet, Rfl: 0 .  levofloxacin (LEVAQUIN) 500 MG tablet, Take 1 tablet (500 mg total) by mouth daily., Disp: 7 tablet, Rfl: 0 .  losartan-hydrochlorothiazide (HYZAAR) 100-25 MG tablet, TAKE 1  TABLET EVERY DAY, Disp: 90 tablet, Rfl: 3 .  metFORMIN (GLUCOPHAGE) 500 MG tablet, TAKE 1 TABLET TWICE DAILY, Disp: 180 tablet, Rfl: 3 .  methylPREDNISolone (MEDROL DOSEPAK) 4 MG TBPK tablet, Medrol Dosepak as directed 4 mg, Disp: 21 tablet, Rfl: 1 .  Omega-3 Fatty Acids (FISH OIL PO), Take 2 capsules by mouth 2 (two) times daily. , Disp: , Rfl:  .  traZODone (DESYREL) 50 MG tablet, TAKE 2 TABLETS EVERY DAY, Disp: 180 tablet, Rfl: 3 .  vitamin C (ASCORBIC ACID) 500 MG tablet, Take 500 mg by mouth daily., Disp: , Rfl:  .  ACCU-CHEK AVIVA PLUS test strip, TEST BLOOD SUGAR TWICE DAILY, Disp: 300 strip, Rfl: 3 .  Accu-Chek Softclix Lancets lancets, Check blood sugar two times daily, Disp: 300 each, Rfl: 3 .  albuterol (VENTOLIN HFA) 108 (90 Base) MCG/ACT inhaler, Inhale 2 puffs into the lungs as needed for wheezing or shortness of breath., Disp: 18 g, Rfl: 3 .  budesonide-formoterol (SYMBICORT) 160-4.5 MCG/ACT inhaler, Inhale 2 puffs into the lungs 2 (two) times daily., Disp: 3 each, Rfl: 3   Allergies  Allergen Reactions  . Aspirin Nausea Only and Other (See Comments)    325 mg aspirin cause stomach upset  . Penicillins Rash    ROS Review of Systems  Constitutional: Negative.   HENT: Negative.   Eyes: Negative.   Respiratory: Negative.   Cardiovascular: Negative.   Gastrointestinal: Negative.   Endocrine: Negative.   Genitourinary: Negative.   Musculoskeletal: Negative.   Skin: Negative.   Allergic/Immunologic: Negative.   Neurological: Negative.   Hematological: Negative.   Psychiatric/Behavioral: Negative.   All other systems reviewed and are negative.     Objective:    Physical Exam Vitals reviewed.  Constitutional:      Appearance: Normal appearance.  HENT:     Mouth/Throat:     Mouth: Mucous membranes are moist.  Eyes:     Pupils: Pupils are equal, round, and reactive to light.  Neck:     Vascular: No carotid bruit.  Cardiovascular:     Rate and Rhythm: Normal  rate and regular rhythm.     Pulses: Normal pulses.     Heart sounds: Normal heart sounds.  Pulmonary:     Effort: Pulmonary effort is normal.     Breath sounds: Normal breath sounds.  Abdominal:     General: Bowel sounds are normal.     Palpations: Abdomen is soft. There is no hepatomegaly, splenomegaly or mass.     Tenderness: There is no abdominal tenderness.     Hernia: No hernia is present.  Musculoskeletal:     Cervical back: Neck supple.     Right lower leg: No edema.     Left lower leg: No edema.  Skin:    Findings: No rash.  Neurological:     Mental Status: He is alert and oriented to person, place, and time.     Motor: No weakness.  Psychiatric:        Mood and Affect: Mood normal.        Behavior: Behavior normal.     BP 140/82   Pulse (!) 59   Ht 5' 6"  (1.676 m)   Wt 216 lb 3.2 oz (98.1 kg)   SpO2 (!) 88% Comment: 2 lliters  BMI 34.90 kg/m  Wt Readings from Last 3 Encounters:  11/01/20 216 lb 3.2 oz (98.1 kg)  10/26/20 216 lb 3.2 oz (98.1 kg)  08/31/20 212 lb (96.2 kg)     Health Maintenance Due  Topic Date Due  . Hepatitis C Screening  Never done  . FOOT EXAM  Never done  . OPHTHALMOLOGY EXAM  Never done  . TETANUS/TDAP  Never done  . HEMOGLOBIN A1C  08/07/2019  . COVID-19 Vaccine (3 - Pfizer risk 4-dose series) 01/13/2020    There are no preventive care reminders to display for this patient.  No results found for: TSH Lab Results  Component Value Date   WBC 9.0 10/26/2020   HGB 13.2 10/26/2020   HCT 39.2 10/26/2020   MCV 91.8 10/26/2020   PLT 239 10/26/2020   Lab Results  Component Value Date   NA 140 10/26/2020   K 4.4 10/26/2020   CO2 34 (H) 10/26/2020   GLUCOSE 167 (H) 10/26/2020   BUN 19 10/26/2020   CREATININE 0.72 10/26/2020   BILITOT 0.5 10/26/2020   ALKPHOS 76 07/03/2019   AST 19 10/26/2020   ALT 20 10/26/2020   PROT 6.5 10/26/2020   ALBUMIN 4.3 07/03/2019   CALCIUM 9.0 10/26/2020   ANIONGAP 9 07/03/2019   No results  found for: CHOL No results found for: HDL No results found for: LDLCALC No results found for: TRIG No results found for: CHOLHDL Lab Results  Component Value Date   HGBA1C 7.3 (H) 02/05/2019      Assessment & Plan:   Problem List Items Addressed This Visit      Cardiovascular and Mediastinum   Essential hypertension    Patient blood pressure is normal patient denies any chest pain  shortness of breath  Is better, there is no history of palpitation paroxysmal nocturnal dyspnea patient can walk 2 blocks without any problem patient was advised to follow low-salt low-cholesterol diet        Respiratory   Respiratory failure ([place]) - Primary    resp failure   Is stable        Endocrine   Diabetes 1.5, managed as type 2 ([place])    - The patient's blood sugar is under control on med. - The patient will continue the current treatment regimen.  - I encouraged the patient to regularly check blood sugar.  - I encouraged the patient to monitor diet. I encouraged the patient to eat low-carb and low-sugar to help prevent blood sugar spikes.  - I encouraged the patient to continue following their prescribed treatment plan for diabetes - I informed the patient to get help if blood sugar drops below 80m/dL, or if suddenly have trouble thinking clearly or breathing.         Other   Obesity (  BMI 30.0-34.9)    - I encouraged the patient to lose weight.  - I educated them on making healthy dietary choices including eating more fruits and vegetables and less fried foods. - I encouraged the patient to exercise more, and educated on the benefits of exercise including weight loss, diabetes prevention, and hypertension prevention.        Shortness of breath    Patient is on oxygen 2 to 3 L/min.  His O2 saturation was 88% on oxygen.  He was advised to lose weight.  Patient does not smoke.         Meds ordered this encounter  Medications  . ACCU-CHEK AVIVA PLUS test strip    Sig: TEST BLOOD  SUGAR TWICE DAILY    Dispense:  300 strip    Refill:  3  . Accu-Chek Softclix Lancets lancets    Sig: Check blood sugar two times daily    Dispense:  300 each    Refill:  3  . budesonide-formoterol (SYMBICORT) 160-4.5 MCG/ACT inhaler    Sig: Inhale 2 puffs into the lungs 2 (two) times daily.    Dispense:  3 each    Refill:  3  . albuterol (VENTOLIN HFA) 108 (90 Base) MCG/ACT inhaler    Sig: Inhale 2 puffs into the lungs as needed for wheezing or shortness of breath.    Dispense:  18 g    Refill:  3  . Blood Glucose Monitoring Suppl (ACCU-CHEK AVIVA PLUS) w/Device KIT    Sig: Check blood sugar BID    Dispense:  1 kit    Refill:  3    Follow-up: No follow-ups on file.    [name], MD

## 2020-11-03 ENCOUNTER — Other Ambulatory Visit: Payer: Self-pay | Admitting: *Deleted

## 2020-11-03 MED ORDER — ALBUTEROL SULFATE HFA 108 (90 BASE) MCG/ACT IN AERS: 2.0000 | INHALATION_SPRAY | RESPIRATORY_TRACT | 3 refills | Status: DC | PRN

## 2020-11-04 ENCOUNTER — Other Ambulatory Visit: Payer: Self-pay | Admitting: *Deleted

## 2020-11-09 ENCOUNTER — Other Ambulatory Visit: Payer: Self-pay | Admitting: *Deleted

## 2020-11-09 MED ORDER — ALBUTEROL SULFATE HFA 108 (90 BASE) MCG/ACT IN AERS: 2.0000 | INHALATION_SPRAY | RESPIRATORY_TRACT | 3 refills | Status: DC | PRN

## 2020-11-30 ENCOUNTER — Ambulatory Visit: Payer: Medicare HMO | Admitting: Internal Medicine

## 2020-12-12 ENCOUNTER — Other Ambulatory Visit: Payer: Self-pay | Admitting: Internal Medicine

## 2020-12-12 DIAGNOSIS — R609 Edema, unspecified: Secondary | ICD-10-CM

## 2020-12-26 ENCOUNTER — Other Ambulatory Visit: Payer: Self-pay | Admitting: Internal Medicine

## 2021-01-16 ENCOUNTER — Telehealth (INDEPENDENT_AMBULATORY_CARE_PROVIDER_SITE_OTHER): Payer: Self-pay | Admitting: Nurse Practitioner

## 2021-01-16 NOTE — Telephone Encounter (Signed)
Patient sched for 5/24 at 2:15 pm

## 2021-01-16 NOTE — Telephone Encounter (Signed)
Patient states he woke up yesterday morning and legs were swollen from the knees down, now they're swollen all the way up to his thighs.  Patients states they feel tight and hurt to walk.  Please advise.

## 2021-01-16 NOTE — Telephone Encounter (Signed)
I called there pt and made him aware of the Np's instructions and that some one will call him to schedule him to be wrapped.

## 2021-01-16 NOTE — Telephone Encounter (Signed)
Bring pt in for wraps. Please advise.

## 2021-01-16 NOTE — Telephone Encounter (Signed)
The patient can come in for wraps however the rapid onset of swelling is concerning.  The patient may need some diuretics adjustment, so he should also follow-up with his PCP.  If the patient feels short of breath he should go to the emergency room for evaluation

## 2021-01-17 ENCOUNTER — Encounter (INDEPENDENT_AMBULATORY_CARE_PROVIDER_SITE_OTHER): Payer: Self-pay

## 2021-01-17 ENCOUNTER — Other Ambulatory Visit: Payer: Self-pay

## 2021-01-17 ENCOUNTER — Ambulatory Visit (INDEPENDENT_AMBULATORY_CARE_PROVIDER_SITE_OTHER): Payer: Medicare HMO | Admitting: Nurse Practitioner

## 2021-01-17 VITALS — BP 110/60 | HR 96 | Resp 16 | Wt 229.6 lb

## 2021-01-17 DIAGNOSIS — I83029 Varicose veins of left lower extremity with ulcer of unspecified site: Secondary | ICD-10-CM | POA: Diagnosis not present

## 2021-01-17 DIAGNOSIS — L03115 Cellulitis of right lower limb: Secondary | ICD-10-CM | POA: Diagnosis not present

## 2021-01-17 DIAGNOSIS — I83019 Varicose veins of right lower extremity with ulcer of unspecified site: Secondary | ICD-10-CM | POA: Diagnosis not present

## 2021-01-17 NOTE — Progress Notes (Signed)
History of Present Illness  There is no documented history at this time  Assessments & Plan   There are no diagnoses linked to this encounter.    Additional instructions  Subjective:  Patient presents with venous ulcer of the Bilateral lower extremity.    Procedure:  3 layer unna wrap was placed Bilateral lower extremity.   Plan:   Follow up in one week.  

## 2021-01-18 ENCOUNTER — Other Ambulatory Visit: Payer: Self-pay | Admitting: Internal Medicine

## 2021-01-23 ENCOUNTER — Encounter (INDEPENDENT_AMBULATORY_CARE_PROVIDER_SITE_OTHER): Payer: Self-pay | Admitting: Nurse Practitioner

## 2021-01-24 ENCOUNTER — Other Ambulatory Visit: Payer: Self-pay

## 2021-01-24 ENCOUNTER — Ambulatory Visit (INDEPENDENT_AMBULATORY_CARE_PROVIDER_SITE_OTHER): Payer: Medicare HMO | Admitting: Nurse Practitioner

## 2021-01-24 ENCOUNTER — Emergency Department: Payer: Medicare HMO

## 2021-01-24 ENCOUNTER — Inpatient Hospital Stay
Admission: EM | Admit: 2021-01-24 | Discharge: 2021-02-14 | DRG: 291 | Disposition: A | Discharge status: Home Health | Payer: Medicare HMO | Attending: Internal Medicine | Admitting: Internal Medicine

## 2021-01-24 ENCOUNTER — Ambulatory Visit: Payer: Medicare HMO | Admitting: Internal Medicine

## 2021-01-24 ENCOUNTER — Inpatient Hospital Stay (HOSPITAL_COMMUNITY)
Admit: 2021-01-24 | Discharge: 2021-01-24 | Disposition: A | Discharge status: Home / Self Care | Payer: Medicare HMO | Attending: Internal Medicine | Admitting: Internal Medicine

## 2021-01-24 ENCOUNTER — Encounter (INDEPENDENT_AMBULATORY_CARE_PROVIDER_SITE_OTHER): Payer: Self-pay | Admitting: Nurse Practitioner

## 2021-01-24 ENCOUNTER — Encounter (INDEPENDENT_AMBULATORY_CARE_PROVIDER_SITE_OTHER): Payer: Self-pay

## 2021-01-24 VITALS — BP 164/78 | HR 105 | Resp 18 | Wt 234.0 lb

## 2021-01-24 DIAGNOSIS — I509 Heart failure, unspecified: Secondary | ICD-10-CM | POA: Diagnosis not present

## 2021-01-24 DIAGNOSIS — Z880 Allergy status to penicillin: Secondary | ICD-10-CM

## 2021-01-24 DIAGNOSIS — K429 Umbilical hernia without obstruction or gangrene: Secondary | ICD-10-CM | POA: Diagnosis not present

## 2021-01-24 DIAGNOSIS — E119 Type 2 diabetes mellitus without complications: Secondary | ICD-10-CM

## 2021-01-24 DIAGNOSIS — J440 Chronic obstructive pulmonary disease with acute lower respiratory infection: Secondary | ICD-10-CM | POA: Diagnosis present

## 2021-01-24 DIAGNOSIS — M7989 Other specified soft tissue disorders: Secondary | ICD-10-CM | POA: Diagnosis not present

## 2021-01-24 DIAGNOSIS — Z7951 Long term (current) use of inhaled steroids: Secondary | ICD-10-CM

## 2021-01-24 DIAGNOSIS — I83019 Varicose veins of right lower extremity with ulcer of unspecified site: Secondary | ICD-10-CM

## 2021-01-24 DIAGNOSIS — Z20822 Contact with and (suspected) exposure to covid-19: Secondary | ICD-10-CM | POA: Diagnosis not present

## 2021-01-24 DIAGNOSIS — I83029 Varicose veins of left lower extremity with ulcer of unspecified site: Secondary | ICD-10-CM | POA: Diagnosis not present

## 2021-01-24 DIAGNOSIS — J189 Pneumonia, unspecified organism: Secondary | ICD-10-CM | POA: Diagnosis not present

## 2021-01-24 DIAGNOSIS — Z87891 Personal history of nicotine dependence: Secondary | ICD-10-CM

## 2021-01-24 DIAGNOSIS — M47816 Spondylosis without myelopathy or radiculopathy, lumbar region: Secondary | ICD-10-CM | POA: Diagnosis not present

## 2021-01-24 DIAGNOSIS — I517 Cardiomegaly: Secondary | ICD-10-CM | POA: Diagnosis not present

## 2021-01-24 DIAGNOSIS — J90 Pleural effusion, not elsewhere classified: Secondary | ICD-10-CM

## 2021-01-24 DIAGNOSIS — L03115 Cellulitis of right lower limb: Secondary | ICD-10-CM

## 2021-01-24 DIAGNOSIS — I2721 Secondary pulmonary arterial hypertension: Secondary | ICD-10-CM | POA: Diagnosis present

## 2021-01-24 DIAGNOSIS — S2242XA Multiple fractures of ribs, left side, initial encounter for closed fracture: Secondary | ICD-10-CM | POA: Diagnosis not present

## 2021-01-24 DIAGNOSIS — I5082 Biventricular heart failure: Secondary | ICD-10-CM | POA: Diagnosis present

## 2021-01-24 DIAGNOSIS — J154 Pneumonia due to other streptococci: Secondary | ICD-10-CM | POA: Diagnosis present

## 2021-01-24 DIAGNOSIS — D62 Acute posthemorrhagic anemia: Secondary | ICD-10-CM | POA: Diagnosis present

## 2021-01-24 DIAGNOSIS — E11649 Type 2 diabetes mellitus with hypoglycemia without coma: Secondary | ICD-10-CM | POA: Diagnosis not present

## 2021-01-24 DIAGNOSIS — J811 Chronic pulmonary edema: Secondary | ICD-10-CM | POA: Diagnosis not present

## 2021-01-24 DIAGNOSIS — D638 Anemia in other chronic diseases classified elsewhere: Secondary | ICD-10-CM | POA: Diagnosis present

## 2021-01-24 DIAGNOSIS — G928 Other toxic encephalopathy: Secondary | ICD-10-CM | POA: Diagnosis not present

## 2021-01-24 DIAGNOSIS — K5730 Diverticulosis of large intestine without perforation or abscess without bleeding: Secondary | ICD-10-CM | POA: Diagnosis not present

## 2021-01-24 DIAGNOSIS — E785 Hyperlipidemia, unspecified: Secondary | ICD-10-CM | POA: Diagnosis present

## 2021-01-24 DIAGNOSIS — J441 Chronic obstructive pulmonary disease with (acute) exacerbation: Secondary | ICD-10-CM | POA: Diagnosis present

## 2021-01-24 DIAGNOSIS — T380X5A Adverse effect of glucocorticoids and synthetic analogues, initial encounter: Secondary | ICD-10-CM | POA: Diagnosis present

## 2021-01-24 DIAGNOSIS — J9600 Acute respiratory failure, unspecified whether with hypoxia or hypercapnia: Secondary | ICD-10-CM

## 2021-01-24 DIAGNOSIS — C8590 Non-Hodgkin lymphoma, unspecified, unspecified site: Secondary | ICD-10-CM | POA: Diagnosis present

## 2021-01-24 DIAGNOSIS — J439 Emphysema, unspecified: Secondary | ICD-10-CM | POA: Diagnosis not present

## 2021-01-24 DIAGNOSIS — I110 Hypertensive heart disease with heart failure: Secondary | ICD-10-CM | POA: Diagnosis not present

## 2021-01-24 DIAGNOSIS — I493 Ventricular premature depolarization: Secondary | ICD-10-CM | POA: Diagnosis present

## 2021-01-24 DIAGNOSIS — Z886 Allergy status to analgesic agent status: Secondary | ICD-10-CM

## 2021-01-24 DIAGNOSIS — I471 Supraventricular tachycardia: Secondary | ICD-10-CM | POA: Diagnosis not present

## 2021-01-24 DIAGNOSIS — I2609 Other pulmonary embolism with acute cor pulmonale: Secondary | ICD-10-CM | POA: Diagnosis not present

## 2021-01-24 DIAGNOSIS — J9811 Atelectasis: Secondary | ICD-10-CM | POA: Diagnosis not present

## 2021-01-24 DIAGNOSIS — E873 Alkalosis: Secondary | ICD-10-CM

## 2021-01-24 DIAGNOSIS — C8300 Small cell B-cell lymphoma, unspecified site: Secondary | ICD-10-CM | POA: Diagnosis not present

## 2021-01-24 DIAGNOSIS — Z8616 Personal history of COVID-19: Secondary | ICD-10-CM

## 2021-01-24 DIAGNOSIS — C9110 Chronic lymphocytic leukemia of B-cell type not having achieved remission: Secondary | ICD-10-CM | POA: Diagnosis not present

## 2021-01-24 DIAGNOSIS — N3944 Nocturnal enuresis: Secondary | ICD-10-CM | POA: Diagnosis not present

## 2021-01-24 DIAGNOSIS — E1165 Type 2 diabetes mellitus with hyperglycemia: Secondary | ICD-10-CM | POA: Diagnosis present

## 2021-01-24 DIAGNOSIS — J9621 Acute and chronic respiratory failure with hypoxia: Secondary | ICD-10-CM | POA: Diagnosis not present

## 2021-01-24 DIAGNOSIS — I5031 Acute diastolic (congestive) heart failure: Secondary | ICD-10-CM | POA: Diagnosis not present

## 2021-01-24 DIAGNOSIS — E669 Obesity, unspecified: Secondary | ICD-10-CM | POA: Diagnosis present

## 2021-01-24 DIAGNOSIS — E875 Hyperkalemia: Secondary | ICD-10-CM

## 2021-01-24 DIAGNOSIS — J9620 Acute and chronic respiratory failure, unspecified whether with hypoxia or hypercapnia: Secondary | ICD-10-CM | POA: Diagnosis present

## 2021-01-24 DIAGNOSIS — Z9981 Dependence on supplemental oxygen: Secondary | ICD-10-CM

## 2021-01-24 DIAGNOSIS — R042 Hemoptysis: Secondary | ICD-10-CM | POA: Diagnosis not present

## 2021-01-24 DIAGNOSIS — Z79899 Other long term (current) drug therapy: Secondary | ICD-10-CM

## 2021-01-24 DIAGNOSIS — G9341 Metabolic encephalopathy: Secondary | ICD-10-CM | POA: Diagnosis not present

## 2021-01-24 DIAGNOSIS — Z6837 Body mass index (BMI) 37.0-37.9, adult: Secondary | ICD-10-CM | POA: Diagnosis not present

## 2021-01-24 DIAGNOSIS — A419 Sepsis, unspecified organism: Secondary | ICD-10-CM | POA: Diagnosis not present

## 2021-01-24 DIAGNOSIS — I5033 Acute on chronic diastolic (congestive) heart failure: Secondary | ICD-10-CM | POA: Diagnosis present

## 2021-01-24 DIAGNOSIS — R059 Cough, unspecified: Secondary | ICD-10-CM | POA: Diagnosis not present

## 2021-01-24 DIAGNOSIS — Z8701 Personal history of pneumonia (recurrent): Secondary | ICD-10-CM | POA: Diagnosis not present

## 2021-01-24 DIAGNOSIS — E6609 Other obesity due to excess calories: Secondary | ICD-10-CM | POA: Diagnosis present

## 2021-01-24 DIAGNOSIS — R0902 Hypoxemia: Secondary | ICD-10-CM | POA: Diagnosis not present

## 2021-01-24 DIAGNOSIS — I5032 Chronic diastolic (congestive) heart failure: Secondary | ICD-10-CM | POA: Diagnosis present

## 2021-01-24 DIAGNOSIS — Z8249 Family history of ischemic heart disease and other diseases of the circulatory system: Secondary | ICD-10-CM

## 2021-01-24 DIAGNOSIS — J9690 Respiratory failure, unspecified, unspecified whether with hypoxia or hypercapnia: Secondary | ICD-10-CM | POA: Diagnosis not present

## 2021-01-24 DIAGNOSIS — J432 Centrilobular emphysema: Secondary | ICD-10-CM | POA: Diagnosis not present

## 2021-01-24 DIAGNOSIS — J9622 Acute and chronic respiratory failure with hypercapnia: Secondary | ICD-10-CM | POA: Diagnosis not present

## 2021-01-24 DIAGNOSIS — I272 Pulmonary hypertension, unspecified: Secondary | ICD-10-CM | POA: Diagnosis not present

## 2021-01-24 DIAGNOSIS — Z7982 Long term (current) use of aspirin: Secondary | ICD-10-CM

## 2021-01-24 DIAGNOSIS — R0602 Shortness of breath: Secondary | ICD-10-CM

## 2021-01-24 DIAGNOSIS — I10 Essential (primary) hypertension: Secondary | ICD-10-CM | POA: Diagnosis not present

## 2021-01-24 DIAGNOSIS — E1169 Type 2 diabetes mellitus with other specified complication: Secondary | ICD-10-CM

## 2021-01-24 DIAGNOSIS — Z7984 Long term (current) use of oral hypoglycemic drugs: Secondary | ICD-10-CM

## 2021-01-24 DIAGNOSIS — R609 Edema, unspecified: Secondary | ICD-10-CM

## 2021-01-24 DIAGNOSIS — E874 Mixed disorder of acid-base balance: Secondary | ICD-10-CM | POA: Diagnosis present

## 2021-01-24 DIAGNOSIS — I878 Other specified disorders of veins: Secondary | ICD-10-CM | POA: Diagnosis present

## 2021-01-24 DIAGNOSIS — J9601 Acute respiratory failure with hypoxia: Secondary | ICD-10-CM

## 2021-01-24 DIAGNOSIS — E118 Type 2 diabetes mellitus with unspecified complications: Secondary | ICD-10-CM | POA: Diagnosis not present

## 2021-01-24 LAB — HEMOGLOBIN A1C
Hgb A1c MFr Bld: 6.8 % — ABNORMAL HIGH (ref 4.8–5.6)
Mean Plasma Glucose: 148 mg/dL

## 2021-01-24 LAB — HEPATIC FUNCTION PANEL
ALT: 18 U/L (ref 0–44)
AST: 20 U/L (ref 15–41)
Albumin: 3.6 g/dL (ref 3.5–5.0)
Alkaline Phosphatase: 67 U/L (ref 38–126)
Bilirubin, Direct: 0.2 mg/dL (ref 0.0–0.2)
Indirect Bilirubin: 0.9 mg/dL (ref 0.3–0.9)
Total Bilirubin: 1.1 mg/dL (ref 0.3–1.2)
Total Protein: 6.9 g/dL (ref 6.5–8.1)

## 2021-01-24 LAB — CBC
HCT: 38.4 % — ABNORMAL LOW (ref 39.0–52.0)
Hemoglobin: 11.8 g/dL — ABNORMAL LOW (ref 13.0–17.0)
MCH: 30.4 pg (ref 26.0–34.0)
MCHC: 30.7 g/dL (ref 30.0–36.0)
MCV: 99 fL (ref 80.0–100.0)
Platelets: 233 10*3/uL (ref 150–400)
RBC: 3.88 MIL/uL — ABNORMAL LOW (ref 4.22–5.81)
RDW: 15.4 % (ref 11.5–15.5)
WBC: 10.2 10*3/uL (ref 4.0–10.5)
nRBC: 0 % (ref 0.0–0.2)

## 2021-01-24 LAB — RESP PANEL BY RT-PCR (FLU A&B, COVID) ARPGX2
Influenza A by PCR: NEGATIVE
Influenza B by PCR: NEGATIVE
SARS Coronavirus 2 by RT PCR: NEGATIVE

## 2021-01-24 LAB — BASIC METABOLIC PANEL
Anion gap: 10 (ref 5–15)
BUN: 21 mg/dL (ref 8–23)
CO2: 29 mmol/L (ref 22–32)
Calcium: 8.5 mg/dL — ABNORMAL LOW (ref 8.9–10.3)
Chloride: 99 mmol/L (ref 98–111)
Creatinine, Ser: 0.9 mg/dL (ref 0.61–1.24)
GFR, Estimated: >60 mL/min (ref >60)
Glucose, Bld: 123 mg/dL — ABNORMAL HIGH (ref 70–99)
Potassium: 6.1 mmol/L — ABNORMAL HIGH (ref 3.5–5.1)
Sodium: 138 mmol/L (ref 135–145)

## 2021-01-24 LAB — CBG MONITORING, ED
Glucose-Capillary: 135 mg/dL — ABNORMAL HIGH (ref 70–99)
Glucose-Capillary: 104 mg/dL — ABNORMAL HIGH (ref 70–99)

## 2021-01-24 LAB — TROPONIN I (HIGH SENSITIVITY): Troponin I (High Sensitivity): 13 ng/L (ref <18)

## 2021-01-24 LAB — TSH: TSH: 1.152 u[IU]/mL (ref 0.350–4.500)

## 2021-01-24 MED ORDER — METOPROLOL SUCCINATE ER 25 MG PO TB24
12.5000 mg | ORAL_TABLET | Freq: Every day | ORAL | Status: DC
  Administered 2021-01-24 – 2021-02-13 (×20): 12.5 mg via ORAL
  Filled 2021-01-24: qty 1
  Filled 2021-01-24: qty 0.5
  Filled 2021-01-24: qty 1
  Filled 2021-01-24 (×2): qty 0.5
  Filled 2021-01-24: qty 1
  Filled 2021-01-24 (×3): qty 0.5
  Filled 2021-01-24 (×2): qty 1
  Filled 2021-01-24 (×11): qty 0.5

## 2021-01-24 MED ORDER — FUROSEMIDE 10 MG/ML IJ SOLN
40.0000 mg | Freq: Once | INTRAMUSCULAR | Status: AC
  Administered 2021-01-24: 40 mg via INTRAVENOUS
  Filled 2021-01-24: qty 4

## 2021-01-24 MED ORDER — FUROSEMIDE 10 MG/ML IJ SOLN
40.0000 mg | Freq: Every day | INTRAMUSCULAR | Status: DC
  Administered 2021-01-25: 40 mg via INTRAVENOUS
  Filled 2021-01-24: qty 4

## 2021-01-24 MED ORDER — IPRATROPIUM-ALBUTEROL 0.5-2.5 (3) MG/3ML IN SOLN
3.0000 mL | RESPIRATORY_TRACT | Status: DC | PRN
  Administered 2021-01-24: 3 mL via RESPIRATORY_TRACT
  Filled 2021-01-24: qty 3

## 2021-01-24 MED ORDER — ASPIRIN EC 81 MG PO TBEC
81.0000 mg | DELAYED_RELEASE_TABLET | Freq: Every day | ORAL | Status: DC
  Administered 2021-01-24 – 2021-02-02 (×10): 81 mg via ORAL
  Filled 2021-01-24 (×10): qty 1

## 2021-01-24 MED ORDER — INSULIN ASPART 100 UNIT/ML IJ SOLN
0.0000 [IU] | Freq: Three times a day (TID) | INTRAMUSCULAR | Status: DC
  Administered 2021-01-25 – 2021-01-26 (×3): 3 [IU] via SUBCUTANEOUS
  Administered 2021-01-26 – 2021-01-27 (×3): 4 [IU] via SUBCUTANEOUS
  Administered 2021-01-28: 7 [IU] via SUBCUTANEOUS
  Administered 2021-01-28: 4 [IU] via SUBCUTANEOUS
  Administered 2021-01-28: 7 [IU] via SUBCUTANEOUS
  Filled 2021-01-24 (×8): qty 1

## 2021-01-24 MED ORDER — SODIUM CHLORIDE 0.9% FLUSH
3.0000 mL | INTRAVENOUS | Status: DC | PRN
  Administered 2021-01-27 – 2021-02-02 (×2): 3 mL via INTRAVENOUS

## 2021-01-24 MED ORDER — MOMETASONE FURO-FORMOTEROL FUM 200-5 MCG/ACT IN AERO
2.0000 | INHALATION_SPRAY | Freq: Two times a day (BID) | RESPIRATORY_TRACT | Status: DC
  Administered 2021-01-24 – 2021-02-04 (×21): 2 via RESPIRATORY_TRACT
  Filled 2021-01-24 (×2): qty 8.8

## 2021-01-24 MED ORDER — ACETAMINOPHEN 325 MG PO TABS
650.0000 mg | ORAL_TABLET | ORAL | Status: DC | PRN
  Administered 2021-01-29 – 2021-02-02 (×5): 650 mg via ORAL
  Filled 2021-01-24 (×5): qty 2

## 2021-01-24 MED ORDER — ATORVASTATIN CALCIUM 20 MG PO TABS
40.0000 mg | ORAL_TABLET | Freq: Every day | ORAL | Status: DC
  Administered 2021-01-24 – 2021-02-14 (×22): 40 mg via ORAL
  Filled 2021-01-24 (×22): qty 2

## 2021-01-24 MED ORDER — SODIUM ZIRCONIUM CYCLOSILICATE 10 G PO PACK
10.0000 g | PACK | ORAL | Status: AC
  Administered 2021-01-24: 10 g via ORAL
  Filled 2021-01-24 (×2): qty 1

## 2021-01-24 MED ORDER — ENOXAPARIN SODIUM 60 MG/0.6ML IJ SOSY
0.5000 mg/kg | PREFILLED_SYRINGE | INTRAMUSCULAR | Status: DC
  Administered 2021-01-24 – 2021-01-29 (×6): 52.5 mg via SUBCUTANEOUS
  Filled 2021-01-24: qty 0.53
  Filled 2021-01-24: qty 0.6
  Filled 2021-01-24 (×5): qty 0.53

## 2021-01-24 MED ORDER — TRAZODONE HCL 100 MG PO TABS
100.0000 mg | ORAL_TABLET | Freq: Every day | ORAL | Status: DC
  Administered 2021-01-24 – 2021-02-13 (×18): 100 mg via ORAL
  Filled 2021-01-24 (×18): qty 1

## 2021-01-24 MED ORDER — CALCIUM GLUCONATE 10 % IV SOLN
1.0000 g | Freq: Once | INTRAVENOUS | Status: AC
  Administered 2021-01-24: 1 g via INTRAVENOUS
  Filled 2021-01-24: qty 10

## 2021-01-24 MED ORDER — FLUTICASONE PROPIONATE 50 MCG/ACT NA SUSP
1.0000 | Freq: Two times a day (BID) | NASAL | Status: DC
  Administered 2021-01-24 – 2021-02-14 (×38): 1 via NASAL
  Filled 2021-01-24 (×2): qty 16

## 2021-01-24 MED ORDER — GABAPENTIN 300 MG PO CAPS
300.0000 mg | ORAL_CAPSULE | Freq: Three times a day (TID) | ORAL | Status: DC
  Administered 2021-01-24 – 2021-02-14 (×59): 300 mg via ORAL
  Filled 2021-01-24 (×60): qty 1

## 2021-01-24 MED ORDER — ONDANSETRON HCL 4 MG/2ML IJ SOLN: 4.0000 mg | Freq: Four times a day (QID) | INTRAMUSCULAR | Status: DC | PRN

## 2021-01-24 MED ORDER — ASCORBIC ACID 500 MG PO TABS
500.0000 mg | ORAL_TABLET | Freq: Every day | ORAL | Status: DC
  Administered 2021-01-24 – 2021-02-14 (×22): 500 mg via ORAL
  Filled 2021-01-24 (×21): qty 1

## 2021-01-24 MED ORDER — CYCLOBENZAPRINE HCL 10 MG PO TABS
10.0000 mg | ORAL_TABLET | Freq: Every evening | ORAL | Status: DC | PRN
  Administered 2021-01-24 – 2021-01-26 (×2): 10 mg via ORAL
  Filled 2021-01-24 (×3): qty 1

## 2021-01-24 MED ORDER — SODIUM CHLORIDE 0.9% FLUSH
3.0000 mL | Freq: Two times a day (BID) | INTRAVENOUS | Status: DC
  Administered 2021-01-24 – 2021-02-14 (×41): 3 mL via INTRAVENOUS

## 2021-01-24 MED ORDER — OMEGA-3-ACID ETHYL ESTERS 1 G PO CAPS
2.0000 g | ORAL_CAPSULE | Freq: Two times a day (BID) | ORAL | Status: DC
  Administered 2021-01-24 – 2021-02-05 (×22): 2 g via ORAL
  Filled 2021-01-24 (×22): qty 2

## 2021-01-24 MED ORDER — SODIUM CHLORIDE 0.9 % IV SOLN: 250.0000 mL | INTRAVENOUS | Status: DC | PRN

## 2021-01-24 MED ORDER — PERFLUTREN LIPID MICROSPHERE
1.0000 mL | INTRAVENOUS | Status: AC | PRN
  Administered 2021-01-24: 2 mL via INTRAVENOUS
  Filled 2021-01-24: qty 10

## 2021-01-24 NOTE — ED Notes (Signed)
Pharmacy contacted for missing meds

## 2021-01-24 NOTE — ED Notes (Signed)
Pt provided pillow and repositioned

## 2021-01-24 NOTE — ED Triage Notes (Signed)
First Nurse Note:  Arrives with C/O leg swelling and sats dropping since Saturday.  Patient wears home oxygen 2l/ [place].  AAOx3.  Skin warm and dry. NAD

## 2021-01-24 NOTE — ED Triage Notes (Signed)
Pt c/o increased SOB with generalized edema of the legs into the abd worse since Saturday. Pt does take a fluid pills. Pt is 2L [place] all the time.

## 2021-01-24 NOTE — ED Notes (Signed)
Pt eating dinner tray. Family at bedside

## 2021-01-24 NOTE — ED Notes (Signed)
Pt increased to 4L [place] to maintain SpO2 >92%

## 2021-01-24 NOTE — ED Provider Notes (Signed)
[facility] Emergency Department Provider Note  ____________________________________________  Time seen: Approximately 12:40 PM  I have reviewed the triage vital signs and the nursing notes.   HISTORY  Chief Complaint Edema and Shortness of Breath    HPI Julian Sparks is a 75 y.o. male with a history of COPD diabetes and CHF who was brought to the ED due to worsening leg swelling and shortness of breath for the past 3 days.  Symptoms are constant, worse with walking.  Denies chest pain.  No fevers chills or cough.  He is having shortness of breath at rest now, and having to increase his home oxygen from his chronic 2 L nasal cannula to 3 or 4 L nasal cannula.      Past Medical History:  Diagnosis Date  . Chronic bronchitis ([place])   . COPD (chronic obstructive pulmonary disease) ([place])   . Diabetes mellitus without complication ([place])   . Diastolic dysfunction   . Dyspnea    with exertion  . Hypertension      Patient Active Problem List   Diagnosis Date Noted  . Acute on chronic diastolic CHF (congestive heart failure) ([place]) 01/24/2021  . Type 2 diabetes mellitus without complication ([place]) 71/01/2693  . Acute on chronic respiratory failure ([place]) 01/24/2021  . Hyperkalemia 01/24/2021  . Obesity (BMI 30-39.9) 01/24/2021  . Shortness of breath 10/26/2020  . Essential hypertension 07/13/2020  . Cellulitis of right lower extremity 07/13/2020  . Need for influenza vaccination 05/11/2020  . Chronic respiratory failure with hypoxia ([place]) 04/27/2020  . Bronchitis 04/27/2020  . Pleurodynia 04/27/2020  . Obesity (BMI 30.0-34.9) 02/17/2020  . Diabetes 1.5, managed as type 2 ([place]) 02/17/2020  . Adhesive capsulitis of both shoulders 02/17/2020  . Edema 07/19/2019  . Leg swelling 07/19/2019  . Diminished pulses in lower extremity 07/19/2019  . Bursitis of shoulder 04/03/2019  . Respiratory failure ([place]) 02/04/2019  . Full thickness rotator cuff tear  05/23/2016     Past Surgical History:  Procedure Laterality Date  . CIRCUMCISION    . SHOULDER ARTHROSCOPY WITH OPEN ROTATOR CUFF REPAIR Left 06/20/2016   Procedure: SHOULDER ARTHROSCOPY WITH OPEN ROTATOR CUFF REPAIR;  Surgeon: Earnestine Leys, MD;  Location: [facility] ORS;  Service: Orthopedics;  Laterality: Left;  . UPPER GI ENDOSCOPY       Prior to Admission medications   Medication Sig Start Date End Date Taking? Authorizing Provider  ACCU-CHEK AVIVA PLUS test strip TEST BLOOD SUGAR TWICE DAILY 11/01/20  Yes Masoud, [name], MD  Accu-Chek Softclix Lancets lancets Check blood sugar two times daily 11/01/20  Yes Masoud, [name], MD  albuterol (VENTOLIN HFA) 108 (90 Base) MCG/ACT inhaler Inhale 2 puffs into the lungs as needed for wheezing or shortness of breath. 11/09/20  Yes Masoud, [name], MD  Alcohol Swabs (B-D SINGLE USE SWABS REGULAR) PADS USE AS DIRECTED 12/27/20  Yes Masoud, [name], MD  aspirin 81 MG EC tablet Take 81 mg by mouth daily. Swallow whole.    Yes [provider]  atorvastatin (LIPITOR) 40 MG tablet TAKE 1 TABLET EVERY DAY Patient taking differently: Take 40 mg by mouth daily. 05/05/20  Yes Masoud, [name], MD  B Complex-C (SUPER B COMPLEX PO) Take 1 tablet by mouth daily.    Yes [provider]  Blood Glucose Monitoring Suppl (ACCU-CHEK AVIVA PLUS) w/Device KIT Check blood sugar BID 11/01/20  Yes Masoud, [name], MD  budesonide-formoterol (SYMBICORT) 160-4.5 MCG/ACT inhaler Inhale 2 puffs into the lungs 2 (two) times daily. 11/01/20  Yes [name], MD  cyclobenzaprine (FLEXERIL) 10 MG tablet Take 10 mg by mouth at bedtime as needed for muscle spasms.   Yes [provider]  fluticasone (FLONASE) 50 MCG/ACT nasal spray Place 1 spray into both nostrils 2 (two) times a day.  11/04/18  Yes [provider]  furosemide (LASIX) 20 MG tablet TAKE 2 TABLETS EVERY DAY Patient taking differently: Take 40 mg by mouth daily. 12/12/20  Yes Masoud, [name], MD  gabapentin  (NEURONTIN) 300 MG capsule Take 1 capsule (300 mg total) by mouth 3 (three) times daily. 09/13/20  Yes Masoud, [name], MD  glipiZIDE (GLUCOTROL XL) 5 MG 24 hr tablet TAKE 1 TABLET TWICE DAILY Patient taking differently: Take 5 mg by mouth 2 (two) times daily before a meal. 01/19/21  Yes Masoud, [name], MD  ipratropium-albuterol (DUONEB) 0.5-2.5 (3) MG/3ML SOLN Take 3 mLs by nebulization every 4 (four) hours as needed. Patient taking differently: Take 3 mLs by nebulization every 4 (four) hours as needed (shortness of breath or wheezing). 10/26/20  Yes Masoud, [name], MD  losartan-hydrochlorothiazide (HYZAAR) 100-25 MG tablet TAKE 1 TABLET EVERY DAY Patient taking differently: Take 1 tablet by mouth daily. 05/19/20  Yes Masoud, [name], MD  metFORMIN (GLUCOPHAGE) 500 MG tablet TAKE 1 TABLET TWICE DAILY Patient taking differently: Take 500 mg by mouth 2 (two) times daily with a meal. 04/25/20  Yes Masoud, [name], MD  Omega-3 Fatty Acids (FISH OIL) 1000 MG CAPS Take 2 capsules by mouth 2 (two) times daily.    Yes [provider]  traZODone (DESYREL) 50 MG tablet TAKE 2 TABLETS EVERY DAY Patient taking differently: Take 100 mg by mouth at bedtime. 07/28/20  Yes Masoud, [name], MD  vitamin C (ASCORBIC ACID) 500 MG tablet Take 500 mg by mouth daily.   Yes [provider]  levofloxacin (LEVAQUIN) 500 MG tablet Take 1 tablet (500 mg total) by mouth daily. Patient not taking: No sig reported 09/13/20   [name], MD  levofloxacin (LEVAQUIN) 500 MG tablet Take 1 tablet (500 mg total) by mouth daily. Patient not taking: No sig reported 10/26/20   [name], MD  methylPREDNISolone (MEDROL DOSEPAK) 4 MG TBPK tablet Medrol Dosepak as directed 4 mg Patient not taking: No sig reported 10/26/20   [name], MD     Allergies Aspirin and Penicillins   Family History  Problem Relation Age of Onset  . Hypertension Mother     Social History Social History   Tobacco Use  . Smoking status:  Former Smoker    Quit date: 03/27/2011    Years since quitting: 9.8  . Smokeless tobacco: Current User    Types: Chew  Substance Use Topics  . Alcohol use: No  . Drug use: No    Review of Systems  Constitutional:   No fever or chills.  ENT:   No sore throat. No rhinorrhea. Cardiovascular:   No chest pain or syncope. Respiratory:   Positive shortness of breath without cough. Gastrointestinal:   Negative for abdominal pain, vomiting and diarrhea.  Musculoskeletal:   Positive leg swelling All other systems reviewed and are negative except as documented above in ROS and HPI.  ____________________________________________   PHYSICAL EXAM:  VITAL SIGNS: ED Triage Vitals  Enc Vitals Group     BP 01/24/21 1059 131/71     Pulse Rate 01/24/21 1059 91     Resp 01/24/21 1059 (!) 24     Temp 01/24/21 1059 98.2 F (36.8 C)  Temp Source 01/24/21 1059 Oral     SpO2 01/24/21 1059 (!) 88 %     Weight 01/24/21 1100 234 lb (106.1 kg)     Height --      Head Circumference --      Peak Flow --      Pain Score 01/24/21 1100 5     Pain Loc --      Pain Edu? --      Excl. in [place]? --     Vital signs reviewed, nursing assessments reviewed.   Constitutional:   Alert and oriented. Non-toxic appearance. Eyes:   Conjunctivae are normal. EOMI. PERRL. ENT      Head:   Normocephalic and atraumatic.      Nose: Normal      Mouth/Throat: Moist mucosa      Neck:   No meningismus. Full ROM. Hematological/Lymphatic/Immunilogical:   No cervical lymphadenopathy. Cardiovascular:   RRR. Symmetric bilateral radial and DP pulses.  No murmurs. Cap refill less than 2 seconds. Respiratory:   Mild tachypnea with a respiratory rate of 20.  Crackles at bilateral bases.  No focal wheezing.  Symmetric air movement. Gastrointestinal:   Soft and nontender. Non distended. There is no CVA tenderness.  No rebound, rigidity, or guarding.  There is pitting edema of the lower abdominal wall below the  umbilicus. Genitourinary:   deferred Musculoskeletal:   Normal range of motion in all extremities. No joint effusions.  No lower extremity tenderness.  2+ pitting edema bilateral lower extremities Neurologic:   Normal speech and language.  Motor grossly intact. No acute focal neurologic deficits are appreciated.  Skin:    Skin is warm, dry and intact. No rash noted.  No petechiae, purpura, or bullae.  ____________________________________________    LABS (pertinent positives/negatives) (all labs ordered are listed, but only abnormal results are displayed) Labs Reviewed  BASIC METABOLIC PANEL - Abnormal; Notable for the following components:      Result Value   Potassium 6.1 (*)    Glucose, Bld 123 (*)    Calcium 8.5 (*)    All other components within normal limits  CBC - Abnormal; Notable for the following components:   RBC 3.88 (*)    Hemoglobin 11.8 (*)    HCT 38.4 (*)    All other components within normal limits  RESP PANEL BY RT-PCR (FLU A&B, COVID) ARPGX2  HEMOGLOBIN A1C  HEPATIC FUNCTION PANEL  TSH  TROPONIN I (HIGH SENSITIVITY)   ____________________________________________   EKG  By me Normal sinus rhythm rate of 94, normal axis and intervals.  Normal QRS and ST segments.  Unremarkable T waves.  No peaked T waves or signs of dysrhythmia or myocardial destabilization  ____________________________________________    RADIOLOGY  DG Chest 2 View  Result Date: 01/24/2021 CLINICAL DATA:  Shortness of breath. EXAM: CHEST - 2 VIEW COMPARISON:  10/28/2020. FINDINGS: Cardiomegaly. Bilateral interstitial prominence consistent interstitial edema. Cardiac B-lines noted. Pneumonitis cannot be excluded. No pleural effusion or pneumothorax. IMPRESSION: Cardiomegaly bilateral interstitial prominence most consistent interstitial edema. Findings consistent with CHF. Electronically Signed   By: Marcello Moores  Register   On: 01/24/2021 11:30     ____________________________________________   PROCEDURES Procedures  ____________________________________________  DIFFERENTIAL DIAGNOSIS   CHF exacerbation, pneumonia, pulmonary edema, non-STEMI, electrolyte abnormality, COVID/flu  CLINICAL IMPRESSION / ASSESSMENT AND PLAN / ED COURSE  Medications ordered in the ED: Medications  sodium zirconium cyclosilicate (LOKELMA) packet 10 g (has no administration in time range)  calcium gluconate inj 10% (1  g) URGENT USE ONLY! (has no administration in time range)  aspirin EC tablet 81 mg (has no administration in time range)  atorvastatin (LIPITOR) tablet 40 mg (has no administration in time range)  traZODone (DESYREL) tablet 100 mg (has no administration in time range)  cyclobenzaprine (FLEXERIL) tablet 10 mg (has no administration in time range)  gabapentin (NEURONTIN) capsule 300 mg (has no administration in time range)  omega-3 acid ethyl esters (LOVAZA) capsule 2 g (has no administration in time range)  ascorbic acid (VITAMIN C) tablet 500 mg (has no administration in time range)  mometasone-formoterol (DULERA) 200-5 MCG/ACT inhaler 2 puff (has no administration in time range)  fluticasone (FLONASE) 50 MCG/ACT nasal spray 1 spray (has no administration in time range)  ipratropium-albuterol (DUONEB) 0.5-2.5 (3) MG/3ML nebulizer solution 3 mL (has no administration in time range)  sodium chloride flush (NS) 0.9 % injection 3 mL (has no administration in time range)  sodium chloride flush (NS) 0.9 % injection 3 mL (has no administration in time range)  0.9 %  sodium chloride infusion (has no administration in time range)  acetaminophen (TYLENOL) tablet 650 mg (has no administration in time range)  ondansetron (ZOFRAN) injection 4 mg (has no administration in time range)  enoxaparin (LOVENOX) injection 52.5 mg (has no administration in time range)  furosemide (LASIX) injection 40 mg (has no administration in time range)  metoprolol  succinate (TOPROL-XL) 24 hr tablet 12.5 mg (has no administration in time range)  insulin aspart (novoLOG) injection 0-20 Units (has no administration in time range)  furosemide (LASIX) injection 40 mg (40 mg Intravenous Given 01/24/21 1208)    Pertinent labs & imaging results that were available during my care of the patient were reviewed by me and considered in my medical decision making (see chart for details).  [name] was evaluated in Emergency Department on 01/24/2021 for the symptoms described in the history of present illness. He was evaluated in the context of the global COVID-19 pandemic, which necessitated consideration that the patient might be at risk for infection with the SARS-CoV-2 virus that causes COVID-19. Institutional protocols and algorithms that pertain to the evaluation of patients at risk for COVID-19 are in a state of rapid change based on information released by regulatory bodies including the CDC and federal and state organizations. These policies and algorithms were followed during the patient's care in the ED.   Patient presents with worsening shortness of breath and symptoms suggestive of CHF exacerbation.  Chest x-ray viewed and interpreted by me which does show some pulmonary edema and vascular congestion.  No focal infiltrate.  Troponin is normal.  Labs unremarkable except for potassium of 6.1.    No EKG changes to suggest cardiac toxicity from hyperkalemia.  We will give a dose of calcium gluconate, Lokelma.  IV Lasix which will also help lower potassium.        ____________________________________________   FINAL CLINICAL IMPRESSION(S) / ED DIAGNOSES    Final diagnoses:  Acute on chronic congestive heart failure, unspecified heart failure type (HCC)  Acute on chronic respiratory failure with hypoxia (HCC)  Type 2 diabetes mellitus without complication, without long-term current use of insulin ([place])  Morbid obesity [facility])     ED Discharge Orders     None      Portions of this note were generated with dragon dictation software. Dictation errors may occur despite best attempts at proofreading.   Carrie Mew, MD 01/24/21 [phone]

## 2021-01-24 NOTE — Progress Notes (Signed)
History of Present Illness  There is no documented history at this time  Assessments & Plan   There are no diagnoses linked to this encounter.    Additional instructions  Subjective:  Patient presents with venous ulcer of the Bilateral lower extremity.    Procedure:  3 layer unna wrap was placed Bilateral lower extremity.   Plan:   Follow up in one week.  

## 2021-01-24 NOTE — ED Notes (Signed)
Pt repositioned in bed.

## 2021-01-24 NOTE — ED Notes (Signed)
Pt provided ice water, ok per Dr Joni Fears

## 2021-01-24 NOTE — ED Notes (Signed)
Pt linens changed and brief changed from urine. desat to 86% on 2L with movement in bed. Increased to 3L [place]

## 2021-01-24 NOTE — H&P (Addendum)
History and Physical    [name] HBZ:[id number] DOB: 1946/06/09 DOA: 01/24/2021  PCP: [name], MD   Patient coming from: Home  I have personally briefly reviewed patient's old medical records in [place]  Chief Complaint: Shortness of breath  HPI: Julian Sparks is a 75 y.o. male with medical history significant for COPD with chronic respiratory failure on 2 L of oxygen, diabetes mellitus, hypertension and chronic venous stasis who presents to the emergency room for evaluation of worsening shortness of breath from his baseline associated with bilateral lower extremity swelling as well as increased abdominal girth for about 2 days. Patient states that at baseline he is usually short of breath but over the last 2 days he has noticed worsening shortness of breath both at rest and exertion.  He also has an increase in his abdominal girth and notes that his pulse oximetry on 2 L at home to be in the 70s. He has chronic venous stasis with ulceration and was seen at the vascular clinic where he had on arrival placed bilaterally to both lower extremities. He has a cough which is nonproductive but denies having any fever, no chills, no headache, no dizziness, no lightheadedness, no nausea, no vomiting, no chest pain, no changes in his bowel habits, no urinary symptoms, no palpitation or diaphoresis. Labs show sodium 138, potassium 6.1, chloride 99, bicarb 29, glucose 123, BUN 21, creatinine 0.90, calcium 8.5, troponin 13, white count 10.2, hemoglobin 11.8, hematocrit 38, MCV 99, RDW 15.4, platelet count 233 Respiratory viral panel is negative Chest x-ray reviewed by me shows cardiomegaly with bilateral interstitial prominence most consistent with CHF. Twelve-lead EKG reviewed by me shows sinus rhythm with PVCs   ED Course: Patient is a 75 year old Caucasian male who presents to the ER for a 2-day history of worsening shortness of breath from his baseline associated with bilateral lower  extremity swelling and increased abdominal girth. Patient was hypoxic with pulse oximetry on 2 L that he was chronically in the 70s and is currently on 4 L of oxygen with pulse oximetry of 95%. Chest x-ray shows pulmonary edema and he has hyperkalemia with no EKG changes. He received a dose of Lasix in the ER as well as Lokelma and will be admitted to the hospital for further evaluation.  Review of Systems: As per HPI otherwise all other systems reviewed and negative.    Past Medical History:  Diagnosis Date  . Chronic bronchitis ([place])   . COPD (chronic obstructive pulmonary disease) ([place])   . Diabetes mellitus without complication ([place])   . Diastolic dysfunction   . Dyspnea    with exertion  . Hypertension     Past Surgical History:  Procedure Laterality Date  . CIRCUMCISION    . SHOULDER ARTHROSCOPY WITH OPEN ROTATOR CUFF REPAIR Left 06/20/2016   Procedure: SHOULDER ARTHROSCOPY WITH OPEN ROTATOR CUFF REPAIR;  Surgeon: Earnestine Leys, MD;  Location: [facility] ORS;  Service: Orthopedics;  Laterality: Left;  . UPPER GI ENDOSCOPY       reports that he quit smoking about 9 years ago. His smokeless tobacco use includes chew. He reports that he does not drink alcohol and does not use drugs.  Allergies  Allergen Reactions  . Aspirin Nausea Only and Other (See Comments)    325 mg aspirin cause stomach upset  . Penicillins Rash    Family History  Problem Relation Age of Onset  . Hypertension Mother       Prior to Admission  medications   Medication Sig Start Date End Date Taking? Authorizing Provider  ACCU-CHEK AVIVA PLUS test strip TEST BLOOD SUGAR TWICE DAILY 11/01/20  Yes Masoud, [name], MD  Accu-Chek Softclix Lancets lancets Check blood sugar two times daily 11/01/20  Yes Masoud, [name], MD  albuterol (VENTOLIN HFA) 108 (90 Base) MCG/ACT inhaler Inhale 2 puffs into the lungs as needed for wheezing or shortness of breath. 11/09/20  Yes Masoud, [name], MD  Alcohol Swabs (B-D SINGLE USE  SWABS REGULAR) PADS USE AS DIRECTED 12/27/20  Yes Masoud, [name], MD  aspirin 81 MG EC tablet Take 81 mg by mouth daily. Swallow whole.    Yes [provider]  atorvastatin (LIPITOR) 40 MG tablet TAKE 1 TABLET EVERY DAY Patient taking differently: Take 40 mg by mouth daily. 05/05/20  Yes Masoud, [name], MD  B Complex-C (SUPER B COMPLEX PO) Take 1 tablet by mouth daily.    Yes [provider]  Blood Glucose Monitoring Suppl (ACCU-CHEK AVIVA PLUS) w/Device KIT Check blood sugar BID 11/01/20  Yes Masoud, [name], MD  budesonide-formoterol (SYMBICORT) 160-4.5 MCG/ACT inhaler Inhale 2 puffs into the lungs 2 (two) times daily. 11/01/20  Yes Masoud, [name], MD  cyclobenzaprine (FLEXERIL) 10 MG tablet Take 10 mg by mouth at bedtime as needed for muscle spasms.   Yes [provider]  fluticasone (FLONASE) 50 MCG/ACT nasal spray Place 1 spray into both nostrils 2 (two) times a day.  11/04/18  Yes [provider]  furosemide (LASIX) 20 MG tablet TAKE 2 TABLETS EVERY DAY Patient taking differently: Take 40 mg by mouth daily. 12/12/20  Yes Masoud, [name], MD  gabapentin (NEURONTIN) 300 MG capsule Take 1 capsule (300 mg total) by mouth 3 (three) times daily. 09/13/20  Yes Masoud, [name], MD  glipiZIDE (GLUCOTROL XL) 5 MG 24 hr tablet TAKE 1 TABLET TWICE DAILY Patient taking differently: Take 5 mg by mouth 2 (two) times daily before a meal. 01/19/21  Yes Masoud, [name], MD  ipratropium-albuterol (DUONEB) 0.5-2.5 (3) MG/3ML SOLN Take 3 mLs by nebulization every 4 (four) hours as needed. Patient taking differently: Take 3 mLs by nebulization every 4 (four) hours as needed (shortness of breath or wheezing). 10/26/20  Yes Masoud, [name], MD  losartan-hydrochlorothiazide (HYZAAR) 100-25 MG tablet TAKE 1 TABLET EVERY DAY Patient taking differently: Take 1 tablet by mouth daily. 05/19/20  Yes Masoud, [name], MD  metFORMIN (GLUCOPHAGE) 500 MG tablet TAKE 1 TABLET TWICE DAILY Patient taking differently: Take  500 mg by mouth 2 (two) times daily with a meal. 04/25/20  Yes Masoud, [name], MD  Omega-3 Fatty Acids (FISH OIL) 1000 MG CAPS Take 2 capsules by mouth 2 (two) times daily.    Yes [provider]  traZODone (DESYREL) 50 MG tablet TAKE 2 TABLETS EVERY DAY Patient taking differently: Take 100 mg by mouth at bedtime. 07/28/20  Yes Masoud, [name], MD  vitamin C (ASCORBIC ACID) 500 MG tablet Take 500 mg by mouth daily.   Yes [provider]  levofloxacin (LEVAQUIN) 500 MG tablet Take 1 tablet (500 mg total) by mouth daily. Patient not taking: No sig reported 09/13/20   [name], MD  levofloxacin (LEVAQUIN) 500 MG tablet Take 1 tablet (500 mg total) by mouth daily. Patient not taking: No sig reported 10/26/20   [name], MD  methylPREDNISolone (MEDROL DOSEPAK) 4 MG TBPK tablet Medrol Dosepak as directed 4 mg Patient not taking: No sig reported 10/26/20   [name], MD    Physical Exam: Vitals:   01/24/21  1059 01/24/21 1100 01/24/21 1200 01/24/21 1230  BP: 131/71  (!) 123/95 135/76  Pulse: 91  91 84  Resp: (!) _0 Temp: 98.2 F (36.8 C)     TempSrc: Oral     SpO2: (!) 88%  92% 96%  Weight:  106.1 kg       Vitals:   01/24/21 1059 01/24/21 1100 01/24/21 1200 01/24/21 1230  BP: 131/71  (!) 123/95 135/76  Pulse: 91  91 84  Resp: (!) _1 Temp: 98.2 F (36.8 C)     TempSrc: Oral     SpO2: (!) 88%  92% 96%  Weight:  106.1 kg        Constitutional: Alert and oriented x 3.  Appears to be in respiratory distress HEENT:      Head: Normocephalic and atraumatic.         Eyes: PERLA, EOMI, Conjunctivae are normal. Sclera is non-icteric.       Mouth/Throat: Mucous membranes are moist.       Neck: Supple with no signs of meningismus. Cardiovascular: Regular rate and rhythm. No murmurs, gallops, or rubs. 2+ symmetrical distal pulses are present . No JVD.  Lower extremities wrapped in Unna boots Respiratory:  Tachypnea.crackles in both lung fields  bilaterally. No wheezes or rhonchi.  Gastrointestinal: Soft, non tender, and non distended with positive bowel sounds.  Central adiposity Genitourinary: No CVA tenderness. Musculoskeletal: Nontender with normal range of motion in all extremities. No cyanosis, or erythema of extremities. Neurologic:  Face is symmetric. Moving all extremities. No gross focal neurologic deficits . Skin: Skin is warm, dry.  Redness of the lower abdominal wall and thighs Psychiatric: Mood and affect are normal   Labs on Admission: I have personally reviewed following labs and imaging studies  CBC: Recent Labs  Lab 01/24/21 1132  WBC 10.2  HGB 11.8*  HCT 38.4*  MCV 99.0  PLT 790   Basic Metabolic Panel: Recent Labs  Lab 01/24/21 1132  NA 138  K 6.1*  CL 99  CO2 29  GLUCOSE 123*  BUN 21  CREATININE 0.90  CALCIUM 8.5*   GFR: Estimated Creatinine Clearance: 79.7 mL/min (by C-G formula based on SCr of 0.9 mg/dL). Liver Function Tests: No results for input(s): AST, ALT, ALKPHOS, BILITOT, PROT, ALBUMIN in the last 168 hours. No results for input(s): LIPASE, AMYLASE in the last 168 hours. No results for input(s): AMMONIA in the last 168 hours. Coagulation Profile: No results for input(s): INR, PROTIME in the last 168 hours. Cardiac Enzymes: No results for input(s): CKTOTAL, CKMB, CKMBINDEX, TROPONINI in the last 168 hours. BNP (last 3 results) No results for input(s): PROBNP in the last 8760 hours. HbA1C: No results for input(s): HGBA1C in the last 72 hours. CBG: No results for input(s): GLUCAP in the last 168 hours. Lipid Profile: No results for input(s): CHOL, HDL, LDLCALC, TRIG, CHOLHDL, LDLDIRECT in the last 72 hours. Thyroid Function Tests: No results for input(s): TSH, T4TOTAL, FREET4, T3FREE, THYROIDAB in the last 72 hours. Anemia Panel: No results for input(s): VITAMINB12, FOLATE, FERRITIN, TIBC, IRON, RETICCTPCT in the last 72 hours. Urine analysis:    Component Value Date/Time    COLORURINE YELLOW 02/11/2012 [place] 02/11/2012 1608   LABSPEC 1.010 02/11/2012 1608   PHURINE 6.5 02/11/2012 1608   GLUCOSEU NEGATIVE 02/11/2012 1608   HGBUR 2+ 02/11/2012 1608   BILIRUBINUR NEGATIVE 02/11/2012 1608   KETONESUR NEGATIVE 02/11/2012 1608   PROTEINUR  NEGATIVE 02/11/2012 1608   NITRITE NEGATIVE 02/11/2012 1608   LEUKOCYTESUR NEGATIVE 02/11/2012 1608    Radiological Exams on Admission: DG Chest 2 View  Result Date: 01/24/2021 CLINICAL DATA:  Shortness of breath. EXAM: CHEST - 2 VIEW COMPARISON:  10/28/2020. FINDINGS: Cardiomegaly. Bilateral interstitial prominence consistent interstitial edema. Cardiac B-lines noted. Pneumonitis cannot be excluded. No pleural effusion or pneumothorax. IMPRESSION: Cardiomegaly bilateral interstitial prominence most consistent interstitial edema. Findings consistent with CHF. Electronically Signed   By: Marcello Moores  Register   On: 01/24/2021 11:30     Assessment/Plan Principal Problem:   Acute on chronic diastolic CHF (congestive heart failure) ([place]) Active Problems:   Type 2 diabetes mellitus without complication (HCC)   Acute on chronic respiratory failure (H[facility])   Hyperkalemia   Obesity (BMI 30-39.9)    Acute on chronic diastolic dysfunction CHF Patient presents for evaluation of worsening shortness of breath from his baseline associated with bilateral lower extremity swelling and hypoxia. Chest x-ray is consistent with CHF Place patient on Lasix 40 mg IV daily Hold losartan due to hyperkalemia Start patient on low-dose beta-blocker Repeat 2D echocardiogram to assess LVEF, last 2D echocardiogram was from 2020 with an LVEF of 55 to 60%.   Acute on chronic respiratory failure Secondary to CHF Patient has a history of chronic respiratory failure and is on 2 L of oxygen continuous He was noted to be hypoxic with pulse oximetry of 88% on his 2 L and is currently on 4 L of oxygen via nasal cannula to maintain pulse  oximetry greater than 92% We will attempt to wean patient down to his baseline home oxygen once his acute CHF improves.    Hyperkalemia Secondary to ARB use Expect improvement in potassium levels following diuretic therapy as well as Lokelma    Diabetes mellitus Maintain consistent carbohydrate diet Glycemic control with sliding scale insulin Hold metformin    History of COPD with chronic respiratory failure Not acutely exacerbated Continue as needed bronchodilator therapy as well as inhaled steroids    Obesity (BMI 37) Complicates overall prognosis and care    Chronic venous stasis and ulceration Patient has bilateral Unna boots placed on 01/24/21 Follow-up with vascular surgery as outpatient  DVT prophylaxis: Lovenox Code Status: full code Family Communication: Greater than 50% of time was spent discussing patient's condition and plan of care with him and his granddaughter at the bedside.  All questions and concerns have been addressed.  He verbalizes understanding and agrees with the plan.  CODE STATUS was discussed and he is a full code. Disposition Plan: Back to previous home environment Consults called: none Status: At the time of admission, it appears that the appropriate admission status this patient is inpatient This is judged to be reasonable and necessary in order to provide the required intensity of service to ensure the patient's safety given the presenting symptoms, physical exam findings and initial radiographic and laboratory data in the context of their comorbid conditions. Patient requires inpatient status due to high intensity of service, high risk for further deterioration and high frequency of surveillance required.    Collier Bullock MD [facility]     01/24/2021, 2:35 PM

## 2021-01-24 NOTE — ED Notes (Signed)
See triage note, pt reports increased shob and BLE edema x2 weeks, sent by dr. Dyspnea noted at rest, speaking in short sentences. 95% on 3L decreased to 2 L [place], wears 2 L chronically. Alert and oriented

## 2021-01-24 NOTE — ED Notes (Signed)
Pt moved to hospital bed for comfort.

## 2021-01-24 NOTE — ED Notes (Signed)
Lab called and informed of A1C add-on.  Lab verbalized understanding and states they would run the lab.

## 2021-01-25 ENCOUNTER — Inpatient Hospital Stay: Payer: Medicare HMO

## 2021-01-25 DIAGNOSIS — E669 Obesity, unspecified: Secondary | ICD-10-CM

## 2021-01-25 DIAGNOSIS — I5033 Acute on chronic diastolic (congestive) heart failure: Secondary | ICD-10-CM | POA: Diagnosis not present

## 2021-01-25 DIAGNOSIS — J9620 Acute and chronic respiratory failure, unspecified whether with hypoxia or hypercapnia: Secondary | ICD-10-CM

## 2021-01-25 DIAGNOSIS — I509 Heart failure, unspecified: Secondary | ICD-10-CM | POA: Diagnosis not present

## 2021-01-25 DIAGNOSIS — I272 Pulmonary hypertension, unspecified: Secondary | ICD-10-CM

## 2021-01-25 DIAGNOSIS — J9621 Acute and chronic respiratory failure with hypoxia: Secondary | ICD-10-CM | POA: Diagnosis not present

## 2021-01-25 DIAGNOSIS — E119 Type 2 diabetes mellitus without complications: Secondary | ICD-10-CM

## 2021-01-25 LAB — BASIC METABOLIC PANEL
Anion gap: 9 (ref 5–15)
BUN: 21 mg/dL (ref 8–23)
CO2: 32 mmol/L (ref 22–32)
Calcium: 8.4 mg/dL — ABNORMAL LOW (ref 8.9–10.3)
Chloride: 97 mmol/L — ABNORMAL LOW (ref 98–111)
Creatinine, Ser: 0.94 mg/dL (ref 0.61–1.24)
GFR, Estimated: >60 mL/min (ref >60)
Glucose, Bld: 99 mg/dL (ref 70–99)
Potassium: 3.9 mmol/L (ref 3.5–5.1)
Sodium: 138 mmol/L (ref 135–145)

## 2021-01-25 LAB — ECHOCARDIOGRAM COMPLETE
AV Peak grad: 7.1 mmHg
Ao pk vel: 1.33 m/s
Area-P 1/2: 5.27 cm2
S' Lateral: 3.7 cm
Weight: 3744 oz

## 2021-01-25 LAB — BLOOD GAS, ARTERIAL
Acid-Base Excess: 10.2 mmol/L — ABNORMAL HIGH (ref 0.0–2.0)
Acid-Base Excess: 13.8 mmol/L — ABNORMAL HIGH (ref 0.0–2.0)
Bicarbonate: 41.5 mmol/L — ABNORMAL HIGH (ref 20.0–28.0)
Bicarbonate: 43.3 mmol/L — ABNORMAL HIGH (ref 20.0–28.0)
Delivery systems: POSITIVE
Expiratory PAP: 8
FIO2: 1
FIO2: 0.4
Inspiratory PAP: 16
O2 Saturation: 98.7 %
O2 Saturation: 90.4 %
Patient temperature: 37
Patient temperature: 37
pCO2 arterial: 99 mmHg (ref 32.0–48.0)
pCO2 arterial: 86 mmHg (ref 32.0–48.0)
pH, Arterial: 7.23 — ABNORMAL LOW (ref 7.350–7.450)
pH, Arterial: 7.31 — ABNORMAL LOW (ref 7.350–7.450)
pO2, Arterial: 138 mmHg — ABNORMAL HIGH (ref 83.0–108.0)
pO2, Arterial: 65 mmHg — ABNORMAL LOW (ref 83.0–108.0)

## 2021-01-25 LAB — GLUCOSE, CAPILLARY
Glucose-Capillary: 103 mg/dL — ABNORMAL HIGH (ref 70–99)
Glucose-Capillary: 136 mg/dL — ABNORMAL HIGH (ref 70–99)
Glucose-Capillary: 127 mg/dL — ABNORMAL HIGH (ref 70–99)
Glucose-Capillary: 99 mg/dL (ref 70–99)

## 2021-01-25 LAB — BRAIN NATRIURETIC PEPTIDE: B Natriuretic Peptide: 278.7 pg/mL — ABNORMAL HIGH (ref 0.0–100.0)

## 2021-01-25 MED ORDER — FUROSEMIDE 10 MG/ML IJ SOLN: 40.0000 mg | Freq: Two times a day (BID) | INTRAMUSCULAR | Status: DC

## 2021-01-25 MED ORDER — HYDROCODONE-ACETAMINOPHEN 5-325 MG PO TABS
1.0000 | ORAL_TABLET | Freq: Four times a day (QID) | ORAL | Status: AC | PRN
  Administered 2021-01-25: 1 via ORAL
  Filled 2021-01-25: qty 1

## 2021-01-25 MED ORDER — BUDESONIDE 0.5 MG/2ML IN SUSP
0.5000 mg | Freq: Two times a day (BID) | RESPIRATORY_TRACT | Status: DC
  Administered 2021-01-25 – 2021-02-14 (×40): 0.5 mg via RESPIRATORY_TRACT
  Filled 2021-01-25 (×40): qty 2

## 2021-01-25 MED ORDER — IPRATROPIUM-ALBUTEROL 0.5-2.5 (3) MG/3ML IN SOLN
3.0000 mL | RESPIRATORY_TRACT | Status: DC
  Administered 2021-01-25 – 2021-01-27 (×10): 3 mL via RESPIRATORY_TRACT
  Filled 2021-01-25 (×10): qty 3

## 2021-01-25 MED ORDER — FUROSEMIDE 10 MG/ML IJ SOLN
40.0000 mg | Freq: Three times a day (TID) | INTRAMUSCULAR | Status: DC
  Administered 2021-01-25 – 2021-01-30 (×14): 40 mg via INTRAVENOUS
  Filled 2021-01-25 (×16): qty 4

## 2021-01-25 MED ORDER — METOLAZONE 2.5 MG PO TABS
2.5000 mg | ORAL_TABLET | Freq: Once | ORAL | Status: DC
  Filled 2021-01-25: qty 1

## 2021-01-25 MED ORDER — LOSARTAN POTASSIUM 50 MG PO TABS
25.0000 mg | ORAL_TABLET | Freq: Every day | ORAL | Status: DC
  Administered 2021-01-26 – 2021-01-30 (×4): 25 mg via ORAL
  Filled 2021-01-25 (×5): qty 1

## 2021-01-25 MED ORDER — NITROGLYCERIN 2 % TD OINT
1.0000 [in_us] | TOPICAL_OINTMENT | Freq: Four times a day (QID) | TRANSDERMAL | Status: DC
  Administered 2021-01-25 – 2021-01-27 (×2): 1 [in_us] via TOPICAL
  Filled 2021-01-25 (×3): qty 1

## 2021-01-25 NOTE — Consult Note (Signed)
Cardiology Consultation:   Patient ID: Julian Sparks: [id number]; DOB: 06/01/1946  Admit date: 01/24/2021 Date of Consult: 01/25/2021  PCP:  [name], MD   [facility] Providers Cardiologist:  New to [facility]}     Patient Profile:   Julian Sparks is a 75 y.o. male with a hx of COPD on home 2L O2, Dm2, HTN, chronic venous stasis who is being seen 01/25/2021 for the evaluation of respiratory distress/CHF at the request of Dr. Francine Graven.  History of Present Illness:   Julian Sparks said he saw a cardiologist a couple years ago for heart failure, but does not remember the details. He follows with PCP, Dr. [name] for cardiac issues. Echo in 01/2019 showed LVEF 55-60%, normal RV function, trivial pericardial effusion. He quit smoking over 10 years ago. No drug history of current alcohol use. He uses 2-3 L O2 at home. He lives by himself, but family lives close by.   Last seen 11/01/20 at PCP office and reported SOB on 2-3 L with O2 88%. Lifestyle changes were recommended.   The patient presented to the ER 12/3120 for worsening shortness of breath, lower leg edema with abdominal fullness. Symptoms had been progressive for the last week. Reported worsening shortness of breath on exertion. Noted oxygen has been low at home, down into the 70s at times. He also reported orthopnea. Also admitted to dull intermittent chest pain that is worse with inspiration. Denies fever, chills, nausea, vomiting, lightheadedness, dizziness. At home he was taking lasix 91m daily. He does not take daily weights. He admits to low sat diet.   In the ER he was placed on 4L O2. Labs showed sodium 138, K 6.1, chloride 99, bicarb 29, glucose 123, GUB 21, creatinine 0.9, WBC 10.2, Hgb 11.8. HS trop 13. Covid negative. CXR showed cardiomegaly with bilateral interstitial prominence most consistent with CHF. EKG poor quality, NSR with no acute ischemic changes. He was given IV lasix, lokelma and admitted for further work-up.   Past  Medical History:  Diagnosis Date  . Chronic bronchitis ([place]   . COPD (chronic obstructive pulmonary disease) ([place]   . Diabetes mellitus without complication ([facility]   . Diastolic dysfunction   . Dyspnea    with exertion  . Hypertension     Past Surgical History:  Procedure Laterality Date  . CIRCUMCISION    . SHOULDER ARTHROSCOPY WITH OPEN ROTATOR CUFF REPAIR Left 06/20/2016   Procedure: SHOULDER ARTHROSCOPY WITH OPEN ROTATOR CUFF REPAIR;  Surgeon: HEarnestine Leys MD;  Location: [facility] ORS;  Service: Orthopedics;  Laterality: Left;  . UPPER GI ENDOSCOPY       Home Medications:  Prior to Admission medications   Medication Sig Start Date End Date Taking? Authorizing Provider  ACCU-CHEK AVIVA PLUS test strip TEST BLOOD SUGAR TWICE DAILY 11/01/20  Yes Masoud, JViann Shove MD  Accu-Chek Softclix Lancets lancets Check blood sugar two times daily 11/01/20  Yes Masoud, JViann Shove MD  albuterol (VENTOLIN HFA) 108 (90 Base) MCG/ACT inhaler Inhale 2 puffs into the lungs as needed for wheezing or shortness of breath. 11/09/20  Yes Masoud, JViann Shove MD  Alcohol Swabs (B-D SINGLE USE SWABS REGULAR) PADS USE AS DIRECTED 12/27/20  Yes Masoud, JViann Shove MD  aspirin 81 MG EC tablet Take 81 mg by mouth daily. Swallow whole.    Yes [provider]  atorvastatin (LIPITOR) 40 MG tablet TAKE 1 TABLET EVERY DAY Patient taking differently: Take 40 mg by mouth daily. 05/05/20  Yes MCletis Athens MD  B  Complex-C (SUPER B COMPLEX PO) Take 1 tablet by mouth daily.    Yes [provider]  Blood Glucose Monitoring Suppl (ACCU-CHEK AVIVA PLUS) w/Device KIT Check blood sugar BID 11/01/20  Yes Masoud, [name], MD  budesonide-formoterol (SYMBICORT) 160-4.5 MCG/ACT inhaler Inhale 2 puffs into the lungs 2 (two) times daily. 11/01/20  Yes Masoud, [name], MD  cyclobenzaprine (FLEXERIL) 10 MG tablet Take 10 mg by mouth at bedtime as needed for muscle spasms.   Yes [provider]  fluticasone (FLONASE) 50 MCG/ACT nasal spray Place  1 spray into both nostrils 2 (two) times a day.  11/04/18  Yes [provider]  furosemide (LASIX) 20 MG tablet TAKE 2 TABLETS EVERY DAY Patient taking differently: Take 40 mg by mouth daily. 12/12/20  Yes Masoud, [name], MD  gabapentin (NEURONTIN) 300 MG capsule Take 1 capsule (300 mg total) by mouth 3 (three) times daily. 09/13/20  Yes Masoud, [name], MD  glipiZIDE (GLUCOTROL XL) 5 MG 24 hr tablet TAKE 1 TABLET TWICE DAILY Patient taking differently: Take 5 mg by mouth 2 (two) times daily before a meal. 01/19/21  Yes Masoud, [name], MD  ipratropium-albuterol (DUONEB) 0.5-2.5 (3) MG/3ML SOLN Take 3 mLs by nebulization every 4 (four) hours as needed. Patient taking differently: Take 3 mLs by nebulization every 4 (four) hours as needed (shortness of breath or wheezing). 10/26/20  Yes Masoud, [name], MD  losartan-hydrochlorothiazide (HYZAAR) 100-25 MG tablet TAKE 1 TABLET EVERY DAY Patient taking differently: Take 1 tablet by mouth daily. 05/19/20  Yes Masoud, [name], MD  metFORMIN (GLUCOPHAGE) 500 MG tablet TAKE 1 TABLET TWICE DAILY Patient taking differently: Take 500 mg by mouth 2 (two) times daily with a meal. 04/25/20  Yes Masoud, [name], MD  Omega-3 Fatty Acids (FISH OIL) 1000 MG CAPS Take 2 capsules by mouth 2 (two) times daily.    Yes [provider]  traZODone (DESYREL) 50 MG tablet TAKE 2 TABLETS EVERY DAY Patient taking differently: Take 100 mg by mouth at bedtime. 07/28/20  Yes Masoud, [name], MD  vitamin C (ASCORBIC ACID) 500 MG tablet Take 500 mg by mouth daily.   Yes [provider]  levofloxacin (LEVAQUIN) 500 MG tablet Take 1 tablet (500 mg total) by mouth daily. Patient not taking: No sig reported 09/13/20   [name], MD  levofloxacin (LEVAQUIN) 500 MG tablet Take 1 tablet (500 mg total) by mouth daily. Patient not taking: No sig reported 10/26/20   [name], MD  methylPREDNISolone (MEDROL DOSEPAK) 4 MG TBPK tablet Medrol Dosepak as directed 4 mg Patient not  taking: No sig reported 10/26/20   [name], MD    Inpatient Medications: Scheduled Meds: . vitamin C  500 mg Oral Daily  . aspirin EC  81 mg Oral Daily  . atorvastatin  40 mg Oral Daily  . enoxaparin (LOVENOX) injection  0.5 mg/kg Subcutaneous Q24H  . fluticasone  1 spray Each Nare BID  . furosemide  40 mg Intravenous Daily  . gabapentin  300 mg Oral TID  . insulin aspart  0-20 Units Subcutaneous TID WC  . metoprolol succinate  12.5 mg Oral Daily  . mometasone-formoterol  2 puff Inhalation BID  . omega-3 acid ethyl esters  2 g Oral BID  . sodium chloride flush  3 mL Intravenous Q12H  . traZODone  100 mg Oral QHS   Continuous Infusions: . sodium chloride     PRN Meds: sodium chloride, acetaminophen, cyclobenzaprine, ipratropium-albuterol, ondansetron (ZOFRAN) IV, sodium chloride flush  Allergies:  Allergies  Allergen Reactions  . Aspirin Nausea Only and Other (See Comments)    325 mg aspirin cause stomach upset  . Penicillins Rash    Social History:   Social History   Socioeconomic History  . Marital status: Widowed    Spouse name: Not on file  . Number of children: 2  . Years of education: Not on file  . Highest education level: Not on file  Occupational History  . Not on file  Tobacco Use  . Smoking status: Former Smoker    Quit date: 03/27/2011    Years since quitting: 9.8  . Smokeless tobacco: Current User    Types: Chew  Substance and Sexual Activity  . Alcohol use: No  . Drug use: No  . Sexual activity: Not on file  Other Topics Concern  . Not on file  Social History Narrative  . Not on file   Social Determinants of Health   Financial Resource Strain: Not on file  Food Insecurity: Not on file  Transportation Needs: Not on file  Physical Activity: Not on file  Stress: Not on file  Social Connections: Not on file  Intimate Partner Violence: Not on file    Family History:    Family History  Problem Relation Age of Onset  . Hypertension  Mother      ROS:  Please see the history of present illness.   All other ROS reviewed and negative.     Physical Exam/Data:   Vitals:   01/25/21 0308 01/25/21 0446 01/25/21 0500 01/25/21 0802  BP:  121/63  92/66  Pulse:  99  88  Resp:  20    Temp:  98.8 F (37.1 C)  97.8 F (36.6 C)  TempSrc:    Oral  SpO2: 92% 92%  93%  Weight:   106 kg     Intake/Output Summary (Last 24 hours) at 01/25/2021 0920 Last data filed at 01/25/2021 0645 Gross per 24 hour  Intake --  Output 550 ml  Net -550 ml   Last 3 Weights 01/25/2021 01/24/2021 01/24/2021  Weight (lbs) 233 lb 12.4 oz 234 lb 234 lb  Weight (kg) 106.04 kg 106.142 kg 106.142 kg     Body mass index is 37.73 kg/m.  General:  Well nourished, well developed, in no acute distress HEENT: normal Lymph: no adenopathy Neck: difficult to assess JVD Endocrine:  No thryomegaly Vascular: No carotid bruits; FA pulses 2+ bilaterally without bruits  Cardiac:  normal S1, S2; RRR; no murmur  Lungs:  Diminished, mild crackles Abd: firm, nontender, no hepatomegaly  Ext: 2+ b/l edema Musculoskeletal:  No deformities, BUE and BLE strength normal and equal Skin: warm and dry  Neuro:  CNs 2-12 intact, no focal abnormalities noted Psych:  Normal affect   EKG:  The EKG was personally reviewed and demonstrates:  Poor quality. NSR, 93bpm, possible LAFB, no acute ST/T changes Telemetry:  Telemetry was personally reviewed and demonstrates:  NSR, HF 80-105, PVCs, NSVT longest 22 beats  Relevant CV Studies:  Echo 2020 1. The left ventricle has normal systolic function, with an ejection  fraction of 55-60%. The cavity size was normal. There is mild concentric  left ventricular hypertrophy. Left ventricular diastolic function could  not be evaluated.  2. The right ventricle has normal systolic function. The cavity was  normal. There is no increase in right ventricular wall thickness.  3. Trivial pericardial effusion is present.  4. The mitral  valve is grossly normal.  5. The  tricuspid valve is grossly normal.  6. The aortic valve is grossly normal.  7. The inferior vena cava was dilated in size with >50% respiratory  variability.  8. very suboptimal study.  Laboratory Data:  High Sensitivity Troponin:   Recent Labs  Lab 01/24/21 1132  TROPONINIHS 13     Chemistry Recent Labs  Lab 01/24/21 1132 01/25/21 0442  NA 138 138  K 6.1* 3.9  CL 99 97*  CO2 29 32  GLUCOSE 123* 99  BUN 21 21  CREATININE 0.90 0.94  CALCIUM 8.5* 8.4*  GFRNONAA >60 >60  ANIONGAP 10 9    Recent Labs  Lab 01/24/21 1405  PROT 6.9  ALBUMIN 3.6  AST 20  ALT 18  ALKPHOS 67  BILITOT 1.1   Hematology Recent Labs  Lab 01/24/21 1132  WBC 10.2  RBC 3.88*  HGB 11.8*  HCT 38.4*  MCV 99.0  MCH 30.4  MCHC 30.7  RDW 15.4  PLT 233   BNPNo results for input(s): BNP, PROBNP in the last 168 hours.  DDimer No results for input(s): DDIMER in the last 168 hours.   Radiology/Studies:  DG Chest 2 View  Result Date: 01/24/2021 CLINICAL DATA:  Shortness of breath. EXAM: CHEST - 2 VIEW COMPARISON:  10/28/2020. FINDINGS: Cardiomegaly. Bilateral interstitial prominence consistent interstitial edema. Cardiac B-lines noted. Pneumonitis cannot be excluded. No pleural effusion or pneumothorax. IMPRESSION: Cardiomegaly bilateral interstitial prominence most consistent interstitial edema. Findings consistent with CHF. Electronically Signed   By: Marcello Moores  Register   On: 01/24/2021 11:30   Assessment and Plan:   Acute on chronic respiratory failure COPD on 2L O2 at home - requiring 4L O2 in the ER. CXR with CHF - IV lasix for CHF - no acute COPD exacerbation - wean O2 as able down to baseline  Acute on chronic diastolic CHF - presented with progressive DOE, LLE and abdominal fullness for the last week. Also reported dull intermittent non-exertional chest discomfort, with HS troponin negative. COVID negative.CXR with CHF given IV lasix in the ER.  PTA lasix 3m daily.  - started on IV lasix 463mdaily. I will increase to BID - home losartan/HCTZ held for hyperkalemia - Toprol 12.61m84mily started - Echo ordered - Echo from 2020 showed LVEF 55-60% - kidney function wnl - so far UOP -550m761meight down 1 lb. - continue with diuresis - check BNP - consider resumption of ARB if K remains stable - daily weights, monitor kidney function, and strict I/Os  Hyperkalemia - PTA  Losartan/HCTZ>>held for K 6.1 - s/p lokelma - K+ 3.9 today - daidly BMET with diuersis  Atypical chest pain - intermittent dull chest discomfort, worse with inspiration - HS trop negative x 1 - EKG without changes - Echo as above - possibly from volume overload - continue aspirin and statin  HTN - PTA Losartan-HCTZ and lasix - d/c HCTZ with need for lasix - losartan held for hyperkalemia - started on metoprolol - bps wnl  HLD - PTA atorvastatin 40mg43mly>>continued - no recent labs  DM2 - SSI per IM   For questions or updates, please contact [facility] Please consult [email or web address] for contact info under    Signed, Treyden Hakim H FurNinfa MeekerC  01/25/2021 9:20 AM

## 2021-01-25 NOTE — Progress Notes (Signed)
   01/25/21 1431  Assess: MEWS Score  Temp 98 F (36.7 C)  BP 139/84  Pulse Rate 98  Resp (!) 32  Level of Consciousness Responds to Voice  SpO2 99 %  O2 Device HFNC;Non-rebreather Mask  Patient Activity (if Appropriate) In bed  O2 Flow Rate (L/min) 8 L/min (15 % nonrebreather)  Assess: MEWS Score  MEWS Temp 0  MEWS Systolic 0  MEWS Pulse 0  MEWS RR 2  MEWS LOC 1  MEWS Score 3  MEWS Score Color Yellow  Assess: if the MEWS score is Yellow or Red  Were vital signs taken at a resting state? Yes  Focused Assessment Change from prior assessment (see assessment flowsheet)  Early Detection of Sepsis Score *See Row Information* Medium  MEWS guidelines implemented *See Row Information* Yes  Treat  MEWS Interventions Administered scheduled meds/treatments;Escalated (See documentation below);Consulted Respiratory Therapy (40 mg Lasix administered IV)  Faces Pain Scale 0  Take Vital Signs  Increase Vital Sign Frequency  Yellow: Q 2hr X 2 then Q 4hr X 2, if remains yellow, continue Q 4hrs  Escalate  MEWS: Escalate Yellow: discuss with charge nurse/RN and consider discussing with provider and RRT  Notify: Charge Nurse/RN  Name of Charge Nurse/RN Notified Linard Millers, RN  Date Charge Nurse/RN Notified 01/25/21  Time Charge Nurse/RN Notified 1422  Notify: Provider  Provider Name/Title Dr. Manuella Ghazi  Date Provider Notified 01/25/21  Time Provider Notified 1423  Notification Type Page  Notification Reason Change in status  Provider response At bedside  Date of Provider Response 01/25/21  Time of Provider Response 1424  Notify: Rapid Response  Name of Rapid Response RN Notified Adalberto Ill, RN  Date Rapid Response Notified 01/25/21  Time Rapid Response Notified 4097  Document  Patient Outcome Other (Comment) (BiPAP to start)

## 2021-01-25 NOTE — Consult Note (Signed)
   Heart Failure Nurse Navigator Note  HFpEF 50-55%.  Grade 2 diastolic dysfunction.  Right ventricular systolic function is moderately reduced.  Mild right atrial enlargement.  He presented to the emergency room with worsening shortness of breath, abdominal girth and lower extremity edema.  Comorbidities:  Morbid obesity Hyperlipidemia Hypertension Type 2 diabetes COPD with home O2 use   Medications:  Aspirin 81 mg daily Atorvastatin 40 mg daily Furosemide 40 mg IV every 8 Losartan 25 mg daily Metoprolol succinate 12-1/2 mg daily  Labs:  Sodium 138, potassium 3.9, chloride 97, CO2 32, BUN 21, creatinine 0.94 Weight 106 kg Intake not documented Output 550 mL  Assessment:  General-he is awake and alert,  HEENT-difficult to assess JVD body habitus  Cardiac-heart tones of regular rate and rhythm, no murmurs rubs are appreciated.  Chest-breath sounds are diminished in the bases  Abdomen- rounded, distended, nontender  Musculoskeletal- patient has Unna boot on lower legs.  Psych-is pleasant and appropriate, makes good eye contact  Neurological- speech is clear moves all extremities.  Initial meeting with patient.  He states that this time he resides with his sister.  They do very little cooking at home and tend to do more eating out.  Daughter who was at the bedside also states that he uses convenience frozen dinners for breakfast lunch and dinner.  She brought up the fact that he should be able to scramble an egg rather than using the convenience breakfast bowls.  Discussed limiting sodium intake to 2000 mg daily.  Also discussed fluid being limited to 64 ounces in a 24-hour period.  Stressed the importance of daily weights and reporting 2 to 3 pound weight gain overnight or 5 pounds within a week.  Also stressed the importance of following up in the outpatient heart failure clinic.  Daughter states that she is familiar with Darylene Price and will make sure that her  father keeps that appointment.  They were given the living with heart failure teaching booklet, zone magnet and handout information on taking control of your heart failure and low-sodium information.   Pricilla Riffle RN [facility]

## 2021-01-25 NOTE — Progress Notes (Signed)
[facility] NAME: Julian Sparks    MR#:  [id number]  DATE OF BIRTH:  1946-01-28  SUBJECTIVE:  CHIEF COMPLAINT:   Chief Complaint  Patient presents with  . Edema  . Shortness of Breath  Very short of breath even at rest.  Cannot lie flat.  Coughing.  Family at bedside.  Obtunded.  Rapid response was called later in the day and now placed on BiPAP REVIEW OF SYSTEMS:  ROS unable to evaluate due to his critical condition DRUG ALLERGIES:   Allergies  Allergen Reactions  . Aspirin Nausea Only and Other (See Comments)    325 mg aspirin cause stomach upset  . Penicillins Rash   VITALS:  Blood pressure 139/84, pulse 98, temperature 98 F (36.7 C), temperature source Oral, resp. rate (!) 32, weight 106 kg, SpO2 96 %. PHYSICAL EXAMINATION:  Physical Exam  75 year old male lying in the bed in acute respiratory distress Lungs decreased breath sounds at the bases, rales present.  No wheezing Cardiovascular S1-S2 normal, no murmurs appreciated Neuro: Patient is obtunded.,  Nonfocal exam Abdomen: Firm and distended, nontender Extremities: ACE wrapped in bilateral lower extremities Skin: Dry and flaky in lower extremities. LABORATORY PANEL:  Male CBC Recent Labs  Lab 01/24/21 1132  WBC 10.2  HGB 11.8*  HCT 38.4*  PLT 233   ------------------------------------------------------------------------------------------------------------------ Chemistries  Recent Labs  Lab 01/24/21 1405 01/25/21 0442  NA  --  138  K  --  3.9  CL  --  97*  CO2  --  32  GLUCOSE  --  99  BUN  --  21  CREATININE  --  0.94  CALCIUM  --  8.4*  AST 20  --   ALT 18  --   ALKPHOS 67  --   BILITOT 1.1  --    RADIOLOGY:  No results found. ASSESSMENT AND PLAN:  75 y.o. male with medical history significant for COPD with chronic respiratory failure on 2 L of oxygen, diabetes mellitus, hypertension and chronic venous stasis admitted for worsening shortness of  breath from his baseline associated with bilateral lower extremity swelling as well as increased abdominal girth   Acute on chronic hypoxic respiratory failure Secondary to CHF, COPD, pulmonary hypertension Patient has a history of chronic respiratory failure and is on 2 L of oxygen continuous Remained hypoxic with more oxygen requirement despite high flow nasal cannula/NRB and now requiring BiPAP to maintain oxygenation Stat ABG shows respiratory acidosis.  We will place him on BiPAP and monitor another 30 to 45-minute and decide need to transfer him to ICU/stepdown  Acute on chronic diastolic dysfunction CHF We will request cardiology consultation Chest x-ray is consistent with CHF Lasix 40 mg IV every 8 hours, losartan 25 mg p.o. daily.  Cardio added metolazone 2.5 mg once daily and nitroglycerin patch - last 2D echocardiogram was from 2020 with an LVEF of 55 to 60%.,  Recheck echo -Continue Lipitor, metoprolol, Lovaza Strict I's and O's, daily weights   Hyperkalemia Secondary to ARB use Resolved following diuretic therapy as well as Lokelma 6.1->3.9  Diabetes mellitus Maintain consistent carbohydrate diet Glycemic control with sliding scale insulin Hold metformin  History of COPD with chronic respiratory failure Not acutely exacerbated Continue as needed bronchodilator therapy as well as inhaled steroids PCCM consult  Obesity (BMI 37) Complicates overall prognosis and care  Chronic venous stasis in bilateral lower extremity with swelling and ulceration Patient  has bilateral Unna boots placed on 01/24/21 Follow-up with vascular surgery as outpatient We will obtain bilateral lower extremity Doppler  Body mass index is 37.73 kg/m.  Net IO Since Admission: -550 mL [01/25/21 1519]      Status is: Inpatient  Remains inpatient appropriate because:IV treatments appropriate due to intensity of illness or inability to take PO   Dispo: The patient is from: Home               Anticipated d/c is to: Home              Patient currently is not medically stable to d/c.   Difficult to place patient No    DVT prophylaxis:        Lovenox    Family Communication: Daughter and wife updated at bedside on 6/1   All the records are reviewed and case discussed with Care Management/Social Worker. Management plans discussed with the patient, family and they are in agreement.  CODE STATUS: Full Code Level of care: Stepdown  TOTAL TIME TAKING CARE OF THIS PATIENT: 35 minutes.   More than 50% of the time was spent in counseling/coordination of care: YES  POSSIBLE D/C IN 3-4 DAYS, DEPENDING ON CLINICAL CONDITION.   Max Sane M.D on 01/25/2021 at 3:19 PM  [facility]   CC: Primary care physician; [name], MD  Note: This dictation was prepared with Dragon dictation along with smaller phrase technology. Any transcriptional errors that result from this process are unintentional.

## 2021-01-25 NOTE — Consult Note (Signed)
Julian Sparks, MRN:  [id number], DOB:  1946-05-08, LOS: 1 ADMISSION DATE:  01/24/2021 CONSULTATION DATE: 01/25/2021  REFERRING MD:  [facility] CHIEF COMPLAINT:  SOB and swelling  BRIEF SYNOPSIS 75 y.o. male with medical history significant for COPD with chronic respiratory failure on 2 L of oxygen +diabetes mellitus, hypertension and chronic venous stasis  presents to the emergency room for evaluationf worsening shortness of breath from his baseline associated with bilateral lower extremity swelling as well as increased abdominal girth for about 2 days. Has generalized anasarca +hypoxia + chronic venous stasis with ulceration and was seen at the vascular clinic where he had on arrival placed bilaterally to both lower extremities.   denies having any fever, no chills Respiratory viral panel is negative Chest x-ray reviewed by me shows cardiomegaly with bilateral interstitial prominence most consistent with CHF.    ED Course. Chest x-ray shows pulmonary edema and he has hyperkalemia with no EKG changes. He received a dose of Lasix in the ER as well as Lokelma and will be admitted to the hospital for further evaluation.   Significant Hospital Events: Including procedures, antibiotic start and stop dates in addition to other pertinent events   . 5/31 admitted for acute diastolic heart failure . 6/1  resp distress and mental status changes, placed on biPAP   Interim History / Subjective:  RAPID RESPONSE  TEAM CALLED Patient placed on biPAP Able to follow simple commands     Antimicrobials:   Antibiotics Given (last 72 hours)    None           Objective   Blood pressure 139/84, pulse 98, temperature 98 F (36.7 C), temperature source Oral, resp. rate (!) 32, weight 106 kg, SpO2 99 %.        Intake/Output Summary (Last 24 hours) at 01/25/2021 1508 Last data filed at 01/25/2021 0645 Gross per 24 hour  Intake --  Output 250 ml  Net -250 ml   Filed Weights    01/24/21 1100 01/25/21 0500  Weight: 106.1 kg 106 kg      REVIEW OF SYSTEMS  PATIENT IS UNABLE TO PROVIDE COMPLETE REVIEW OF SYSTEMS DUE TO SEVERE CRITICAL ILLNESS AND TOXIC METABOLIC ENCEPHALOPATHY   PHYSICAL EXAMINATION:  GENERAL:critically ill appearing, on biPAP HEAD: Normocephalic, atraumatic.  EYES: Pupils equal, round, reactive to light.  No scleral icterus.  MOUTH: Moist mucosal membrane. NECK: Supple. PULMONARY: +crackles CARDIOVASCULAR: S1 and S2. Regular rate and rhythm. No murmurs, rubs, or gallops.  GASTROINTESTINAL: Soft, nontender, -distended. Positive bowel sounds.  MUSCULOSKELETAL:+edema.  NEUROLOGIC: lethargic but arousable to vocal stimuli, follows simple commands SKIN:intact,warm,dry   Labs/imaging that I havepersonally reviewed  (right click and "Reselect all SmartList Selections" daily)        ASSESSMENT AND PLAN SYNOPSIS    Severe ACUTE Hypoxic and Hypercapnic Respiratory Failure due to acute severe decompensated combined systloic and diastolic heart failure  Plan for biPAP Repeat ABG High risk for intubation, low threshold to transfer to ICU/SD   SEVERE COPD EXACERBATION -continue IV steroids as prescribed -continue NEB THERAPY as prescribed -morphine as needed -wean fio2 as needed and tolerated    CARDIAC FAILURE-acute combined systolic/diastolic dysfunction -oxygen as needed -Lasix as tolerated -follow up cardiac enzymes as indicated Follow up cariology recs   CARDIAC ICU monitoring    NEUROLOGY Acute toxic metabolic encephalopathy due to hypoxia and hypercapnia    ENDO - ICU hypoglycemic\Hyperglycemia protocol -check FSBS per protocol   GI GI PROPHYLAXIS as indicated  NUTRITIONAL STATUS DIET-->NPO Constipation protocol as indicated   ELECTROLYTES -follow labs as needed -replace as needed -pharmacy consultation and following    Labs   CBC: Recent Labs  Lab 01/24/21 1132  WBC 10.2  HGB 11.8*   HCT 38.4*  MCV 99.0  PLT 468    Basic Metabolic Panel: Recent Labs  Lab 01/24/21 1132 01/25/21 0442  NA 138 138  K 6.1* 3.9  CL 99 97*  CO2 29 32  GLUCOSE 123* 99  BUN 21 21  CREATININE 0.90 0.94  CALCIUM 8.5* 8.4*   GFR: Estimated Creatinine Clearance: 76.3 mL/min (by C-G formula based on SCr of 0.94 mg/dL). Recent Labs  Lab 01/24/21 1132  WBC 10.2    Liver Function Tests: Recent Labs  Lab 01/24/21 1405  AST 20  ALT 18  ALKPHOS 67  BILITOT 1.1  PROT 6.9  ALBUMIN 3.6   No results for input(s): LIPASE, AMYLASE in the last 168 hours. No results for input(s): AMMONIA in the last 168 hours.  ABG    Component Value Date/Time   PHART 7.23 (L) 01/25/2021 1435   PCO2ART 99 (HH) 01/25/2021 1435   PO2ART 138 (H) 01/25/2021 1435   HCO3 41.5 (H) 01/25/2021 1435   O2SAT 98.7 01/25/2021 1435   HbA1C: Hgb A1c MFr Bld  Date/Time Value Ref Range Status  01/24/2021 11:32 AM 6.8 (H) 4.8 - 5.6 % Final    Comment:    (NOTE)         Prediabetes: 5.7 - 6.4         Diabetes: >6.4         Glycemic control for adults with diabetes: <7.0   02/05/2019 02:07 AM 7.3 (H) 4.8 - 5.6 % Final    Comment:    (NOTE) Pre diabetes:          5.7%-6.4% Diabetes:              >6.4% Glycemic control for   <7.0% adults with diabetes     CBG: Recent Labs  Lab 01/24/21 1636 01/24/21 2242 01/25/21 0804 01/25/21 1225  GLUCAP 135* 104* 103* 136*    Allergies Allergies  Allergen Reactions  . Aspirin Nausea Only and Other (See Comments)    325 mg aspirin cause stomach upset  . Penicillins Rash        Critical Care Time devoted to patient care services described in this note is 55 minutes.  Critical care was necessary to treat or prevent imminent or life-threatening deterioration. Overall, patient is critically ill, prognosis is guarded.    Corrin Parker, M.D.  [facility]  Medical Director [place] Director [facility]  Cardio-Pulmonary Department

## 2021-01-25 NOTE — Consult Note (Signed)
WOC Nurse Consult Note: Consulted due to bilateral 3 layer compression wraps.  They were applied by vascular office yesterday, patient reports.  HE indicates that he will remove them Monday and he will go back to MD on TUesday for re-application.  Dressings are secure and intact.  No need for change.  This is not the reason for his hospitalization.  Due to shortness of breath, I remind him that compression can mobilize fluid and if he is struggling to breathe at any point, let staff know and we will remove the wraps.  He verbalizes understanding. Reason for Consult:Venous stasis disease.  MD visit yesterday indicates bilateral venous ulcers.  MD assessed and re-wrapped and asked to see him back in one week.  No need for WOC intervention at this time.  Wound type:venous Pressure Injury POA: NA Dressing procedure/placement/frequency: Keep wraps intact.  Due to be removed 01/30/21 and back to MD for re-wrap on 01/31/21  If patient has difficulty breathing due to excess fluid, wraps can be removed and WOC team re-consulted.  Will not follow at this time.  Please re-consult if needed.  Domenic Moras MSN, RN, FNP-BC CWON Wound, Ostomy, Continence Nurse Pager [phone]

## 2021-01-25 NOTE — Progress Notes (Signed)
  Chaplain On-Call responded to the Rapid Response notification.  Upon first visit, the Medical Team was providing care for the patient. Chaplain provided presence outside the room.  Later, this Chaplain returned and learned that the patient's family are in discussions with the Physician regarding the next steps of care.  This Chaplain will make a referral to the Evening Chaplain for follow up support.  Chaplain Pollyann Samples M.Div., [facility]

## 2021-01-25 NOTE — Progress Notes (Signed)
IVT consulted to place a second PIV. Current PIV flushed well and clamped. Currently no IV meds running and all ordered are compatible.  RN Brittney aware IVT unable to place a second line at this time; however, will be glad to assess and place line should pt Rx's or status warrant a new PIV.

## 2021-01-25 NOTE — Progress Notes (Signed)
   01/25/21 2021  Assess: MEWS Score  Temp 99.1 F (37.3 C)  BP (!) 100/54  Pulse Rate 77  Resp 16  Level of Consciousness Responds to Voice  SpO2 96 %  O2 Device Bi-PAP  Assess: MEWS Score  MEWS Temp 0  MEWS Systolic 1  MEWS Pulse 0  MEWS RR 0  MEWS LOC 1  MEWS Score 2  MEWS Score Color Yellow  Assess: if the MEWS score is Yellow or Red  Were vital signs taken at a resting state? Yes  Focused Assessment No change from prior assessment  Early Detection of Sepsis Score *See Row Information* Low  MEWS guidelines implemented *See Row Information* Yes  Treat  Pain Scale 0-10  Pain Score 0  Notify: Charge Nurse/RN  Name of Charge Nurse/RN Notified Ria Comment, RN  Date Charge Nurse/RN Notified 01/25/21  Time Charge Nurse/RN Notified 2100  Notify: Provider  Provider Name/Title Dr. Sabino Gasser  Date Provider Notified 01/25/21  Time Provider Notified 2100  Notification Type Page  Notification Reason Critical result  Provider response Other (Comment) (Hold lasix)  Date of Provider Response 01/25/21  Time of Provider Response 2105

## 2021-01-25 NOTE — Significant Event (Signed)
Rapid Response Event Note   Reason for Call :  Increasing O2 demands, lethargy  Initial Focused Assessment:  Rapid response RN arrived in patient's room with patient sitting up in bed with nasal cannula and non-rebreather on. Per report from patient's primary RN patient had been increasingly lethargic and requiring more oxygen. Patient with known CHF and COPD and just received dose of IV lasix to help with fluid overload. Patient's lungs diminished to auscultation with lots of coarse and fine crackles. Swelling noted in extremities. Chest x-ray from yesterday showing probable interstitial edema. Patient would arouse to voice and try to answer questions, but did appear tired.  Interventions:  Respiratory therapy drew ABG which showed pH 7.23, CO2 99, O2 138 on 100% FiO2 with NRB mask, and Bicarbonate 41.5.  Plan of Care:  Respiratory therapy starting patient on Bipap. Dr. Rockey Situ came and updated family as well and stated he would review patient's orders and labs to see if more diuresis was possible. Dr. Manuella Ghazi stated he would get in contact with Pulmonary Critical Care Medicine for a consult on patient. Patient to remain on 2A for trial of Bipap and then be re-evaluated by care team. Family at bedside updated on current interventions and plan of care.  Event Summary:   MD Notified: Dr. Manuella Ghazi and Dr. Rockey Situ Call Time: 14:29 Arrival Time: 14:30 End Time: 14:50  Elwin Tsou, Jaynie Bream, RN

## 2021-01-25 NOTE — Progress Notes (Signed)
Rapid Response Follow-up:  Patient is alert and following commands. SPO2 ~90% on BiPAP with 40% FiO2. No acute or obvious distress. Bedside RN instructed to call if patient's condition worsens.

## 2021-01-26 ENCOUNTER — Inpatient Hospital Stay: Payer: Medicare HMO

## 2021-01-26 DIAGNOSIS — I5033 Acute on chronic diastolic (congestive) heart failure: Secondary | ICD-10-CM | POA: Diagnosis not present

## 2021-01-26 LAB — CBC
HCT: 35.6 % — ABNORMAL LOW (ref 39.0–52.0)
Hemoglobin: 10.7 g/dL — ABNORMAL LOW (ref 13.0–17.0)
MCH: 29.8 pg (ref 26.0–34.0)
MCHC: 30.1 g/dL (ref 30.0–36.0)
MCV: 99.2 fL (ref 80.0–100.0)
Platelets: 236 10*3/uL (ref 150–400)
RBC: 3.59 MIL/uL — ABNORMAL LOW (ref 4.22–5.81)
RDW: 15.3 % (ref 11.5–15.5)
WBC: 9.1 10*3/uL (ref 4.0–10.5)
nRBC: 0 % (ref 0.0–0.2)

## 2021-01-26 LAB — LIPID PANEL
Cholesterol: 101 mg/dL (ref 0–200)
HDL: 46 mg/dL (ref >40)
LDL Cholesterol: 36 mg/dL (ref 0–99)
Total CHOL/HDL Ratio: 2.2 RATIO
Triglycerides: 97 mg/dL (ref <150)
VLDL: 19 mg/dL (ref 0–40)

## 2021-01-26 LAB — BASIC METABOLIC PANEL
Anion gap: 10 (ref 5–15)
BUN: 25 mg/dL — ABNORMAL HIGH (ref 8–23)
CO2: 34 mmol/L — ABNORMAL HIGH (ref 22–32)
Calcium: 8.2 mg/dL — ABNORMAL LOW (ref 8.9–10.3)
Chloride: 95 mmol/L — ABNORMAL LOW (ref 98–111)
Creatinine, Ser: 1.08 mg/dL (ref 0.61–1.24)
GFR, Estimated: >60 mL/min (ref >60)
Glucose, Bld: 71 mg/dL (ref 70–99)
Potassium: 4 mmol/L (ref 3.5–5.1)
Sodium: 139 mmol/L (ref 135–145)

## 2021-01-26 LAB — GLUCOSE, CAPILLARY
Glucose-Capillary: 70 mg/dL (ref 70–99)
Glucose-Capillary: 197 mg/dL — ABNORMAL HIGH (ref 70–99)
Glucose-Capillary: 164 mg/dL — ABNORMAL HIGH (ref 70–99)
Glucose-Capillary: 153 mg/dL — ABNORMAL HIGH (ref 70–99)

## 2021-01-26 MED ORDER — GUAIFENESIN-DM 100-10 MG/5ML PO SYRP
5.0000 mL | ORAL_SOLUTION | ORAL | Status: DC | PRN
  Administered 2021-01-29 – 2021-02-05 (×3): 5 mL via ORAL
  Filled 2021-01-26 (×4): qty 5

## 2021-01-26 NOTE — Progress Notes (Signed)
Continued the heart failure education that was started yesterday with the heart failure coordinator. The patient's daughter was present and an active part of the education.   Used teach back method to assess amount of heart failure education that patient retained from the day before. Utilized living with heart failure booklet and heart failure magnet to reinforce education, stressing the importance of daily weights, monitoring daily sodium intake, and fluid intake. Discussed ways to motivate patient to change, and discussed alternatives to high sodium foods and snacks that the patient would enjoy.  Reviewed with patient their scheduled appointment with the heart failure clinic.

## 2021-01-26 NOTE — Progress Notes (Signed)
Patient awake and alert, following commands. Requesting to come off the Bipap mask for a while. Vitals are stable at this time. Respirations within normal limits. SpO2 94%. Placed patient on heated HFNC 50L @ 40%. Tolerating well. Strong cough. Able to clear his airway. SpO2 94%. Will continue to monitor.

## 2021-01-26 NOTE — Progress Notes (Addendum)
Julian Sparks, MRN:  [id number], DOB:  1946-07-19, LOS: 2 ADMISSION DATE:  01/24/2021 CONSULTATION DATE: 01/26/2021  REFERRING MD:  [facility] CHIEF COMPLAINT:  SOB and swelling   Brief Pt Description/Synopsis:  Severe ACUTE Hypoxic and Hypercapnic Respiratory Failure due to acute severe decompensated combined systloic and diastolic heart failure   History of Present Illness:   75 y.o. male with medical history significant for COPD with chronic respiratory failure on 2 L of oxygen +diabetes mellitus, hypertension and chronic venous stasis  presents to the emergency room for evaluationf worsening shortness of breath from his baseline associated with bilateral lower extremity swelling as well as increased abdominal girth for about 2 days. Has generalized anasarca +hypoxia + chronic venous stasis with ulceration and was seen at the vascular clinic where he had on arrival placed bilaterally to both lower extremities.   denies having any fever, no chills Respiratory viral panel is negative Chest x-ray reviewed by me shows cardiomegaly with bilateral interstitial prominence most consistent with CHF.    ED Course. Chest x-ray shows pulmonary edema and he has hyperkalemia with no EKG changes. He received a dose of Lasix in the ER as well as Lokelma and will be admitted to the hospital for further evaluation.   Significant Hospital Events: Including procedures, antibiotic start and stop dates in addition to other pertinent events   . 5/31 admitted for acute diastolic heart failure . 6/1  resp distress and mental status changes, placed on biPAP . 6/2: Respiratory status improved, weaned off BiPAP to HFNC 50% FiO2 @ 60L   Interim History / Subjective:  -Respiratory status improved, weaned from BiPAP to HFNC 50% FiO2 @ 60L -Documented UOP 350 last 24 hrs (Net -900 ml since admit)  -Creatinine slightly increased to 1.08 (0.94 yesterday) -Echocardiogram results pending -Pt is up in  recliner, just finished all of his breakfast -Pt reports his breathing has improved, also reports mild wheezing and intermittent cough productive of yellow/tan sputum -Denies chest pain, palpitations, fever, chills   Antimicrobials:   Antibiotics Given (last 72 hours)    None         Objective   Blood pressure (!) 114/57, pulse 83, temperature 99.5 F (37.5 C), temperature source Oral, resp. rate 18, weight 106 kg, SpO2 96 %.    FiO2 (%):  [40 %-50 %] 50 %   Intake/Output Summary (Last 24 hours) at 01/26/2021 3299 Last data filed at 01/26/2021 0631 Gross per 24 hour  Intake --  Output 350 ml  Net -350 ml   Filed Weights   01/24/21 1100 01/25/21 0500  Weight: 106.1 kg 106 kg      REVIEW OF SYSTEMS Positives in BOLD: Gen: Denies fever, chills, weight change, fatigue, night sweats HEENT: Denies blurred vision, double vision, hearing loss, tinnitus, sinus congestion, rhinorrhea, sore throat, neck stiffness, dysphagia PULM: Denies shortness of breath, cough, sputum production, hemoptysis, wheezing CV: Denies chest pain, edema, orthopnea, paroxysmal nocturnal dyspnea, palpitations GI: Denies abdominal pain, nausea, vomiting, diarrhea, hematochezia, melena, constipation, change in bowel habits GU: Denies dysuria, hematuria, polyuria, oliguria, urethral discharge Endocrine: Denies hot or cold intolerance, polyuria, polyphagia or appetite change Derm: Denies rash, dry skin, scaling or peeling skin change Heme: Denies easy bruising, bleeding, bleeding gums Neuro: Denies headache, numbness, weakness, slurred speech, loss of memory or consciousness    PHYSICAL EXAMINATION:  GENERAL: Acute on chronically ill-appearing male, sitting in recliner, on HFNC, no acute distress HEAD: Normocephalic, atraumatic.  EYES: Pupils equal, round,  reactive to light.  No scleral icterus.  MOUTH: Moist mucosal membrane. NECK: Supple, positive JVD PULMONARY: Diminished breath sounds bilaterally  with mild expiratory wheezing, even, nonlabored, normal effort CARDIOVASCULAR: S1 and S2. Regular rate and rhythm. No murmurs, rubs, or gallops.  GASTROINTESTINAL: Soft, nontender, -distended. Positive bowel sounds.  MUSCULOSKELETAL:1+edema bilateral lower extremity edema with legs wrapped, no deformities.  NEUROLOGIC: Awake, alert and oriented x3, moves all extremities to command, no focal deficits, speech clear, pupils PERRLA SKIN: Limited exam- intact,warm,dry   Labs/imaging that I havepersonally reviewed  (right click and "Reselect all SmartList Selections" daily)  Labs 01/26/2021: Bicarbonate 34, BUN 25, creatinine 1.08, WBC 9.1, hemoglobin 10.7, hematocrit 35.6 Venous ultrasound bilateral lower extremities 6/2>> negative     ASSESSMENT / PLAN:    Severe ACUTE Hypoxic and Hypercapnic Respiratory Failure due to acute decompensated HFpEF and AECOPD PMHx of COPD -Supplemental O2 as needed to maintain O2 sats 88 to 94% -Currently on HFNC 50% FiO2 -BiPAP as needed -Follow intermittent chest x-ray and ABG as needed -Diuresis as blood pressure and renal function permits -Aggressive pulmonary toilet -Continue bronchodilators, budesonide nebs, and Dulera -Pt reports cough productive of yellowish/tan sputum ~ will obtain Sputum culture  Acute on Chronic HFpEF PMHx of Hypertension, Hyperlipidemia -Continuous cardiac monitoring -Maintain MAP greater than 65 -Cardiology following, appreciate input -Diuresis as blood pressure and renal function permits (currently on 40 mg IV Lasix q8h as per Cardiology) -Echocardiogram pending    Patient's respiratory status is greatly improved, weaned from BiPAP to HFNC at 50% FiO2.  PCCM will sign off at this time.  Please reconsult if any critical care needs arise or we can be of further assistance.  Thank you for allowing [place] to participate in the care of this patient.      Labs   CBC: Recent Labs  Lab 01/24/21 1132 01/26/21 0540  WBC 10.2  9.1  HGB 11.8* 10.7*  HCT 38.4* 35.6*  MCV 99.0 99.2  PLT 233 572    Basic Metabolic Panel: Recent Labs  Lab 01/24/21 1132 01/25/21 0442 01/26/21 0540  NA 138 138 139  K 6.1* 3.9 4.0  CL 99 97* 95*  CO2 29 32 34*  GLUCOSE 123* 99 71  BUN 21 21 25*  CREATININE 0.90 0.94 1.08  CALCIUM 8.5* 8.4* 8.2*   GFR: Estimated Creatinine Clearance: 66.4 mL/min (by C-G formula based on SCr of 1.08 mg/dL). Recent Labs  Lab 01/24/21 1132 01/26/21 0540  WBC 10.2 9.1    Liver Function Tests: Recent Labs  Lab 01/24/21 1405  AST 20  ALT 18  ALKPHOS 67  BILITOT 1.1  PROT 6.9  ALBUMIN 3.6   No results for input(s): LIPASE, AMYLASE in the last 168 hours. No results for input(s): AMMONIA in the last 168 hours.  ABG    Component Value Date/Time   PHART 7.31 (L) 01/25/2021 1536   PCO2ART 86 (HH) 01/25/2021 1536   PO2ART 65 (L) 01/25/2021 1536   HCO3 43.3 (H) 01/25/2021 1536   O2SAT 90.4 01/25/2021 1536   HbA1C: Hgb A1c MFr Bld  Date/Time Value Ref Range Status  01/24/2021 11:32 AM 6.8 (H) 4.8 - 5.6 % Final    Comment:    (NOTE)         Prediabetes: 5.7 - 6.4         Diabetes: >6.4         Glycemic control for adults with diabetes: <7.0   02/05/2019 02:07 AM 7.3 (H) 4.8 - 5.6 %  Final    Comment:    (NOTE) Pre diabetes:          5.7%-6.4% Diabetes:              >6.4% Glycemic control for   <7.0% adults with diabetes     CBG: Recent Labs  Lab 01/25/21 0804 01/25/21 1225 01/25/21 1625 01/25/21 2034 01/26/21 0739  GLUCAP 103* 136* 127* 99 70    Allergies Allergies  Allergen Reactions  . Aspirin Nausea Only and Other (See Comments)    325 mg aspirin cause stomach upset  . Penicillins Rash       Darel Hong, AGACNP-BC [facility] Prefer epic messenger for cross cover needs If after hours, please call E-link

## 2021-01-26 NOTE — Progress Notes (Signed)
[place] [facility] [place] NAME: Julian Sparks    MR#:  [id number]  DATE OF BIRTH:  1946-05-16  SUBJECTIVE:  CHIEF COMPLAINT:   Chief Complaint  Patient presents with  . Edema  . Shortness of Breath  He is much more awake today.  Still has distended abdomen and short of breath.  Reports improvement in his symptoms REVIEW OF SYSTEMS:  Review of Systems  Constitutional: Negative for diaphoresis, fever, malaise/fatigue and weight loss.  HENT: Negative for ear discharge, ear pain, hearing loss, nosebleeds, sore throat and tinnitus.   Eyes: Negative for blurred vision and pain.  Respiratory: Positive for shortness of breath. Negative for cough, hemoptysis and wheezing.   Cardiovascular: Positive for orthopnea, leg swelling and PND. Negative for chest pain and palpitations.  Gastrointestinal: Negative for abdominal pain, blood in stool, constipation, diarrhea, heartburn, nausea and vomiting.  Genitourinary: Negative for dysuria, frequency and urgency.  Musculoskeletal: Negative for back pain and myalgias.  Skin: Negative for itching and rash.  Neurological: Negative for dizziness, tingling, tremors, focal weakness, seizures, weakness and headaches.  Psychiatric/Behavioral: Negative for depression. The patient is not nervous/anxious.     DRUG ALLERGIES:   Allergies  Allergen Reactions  . Aspirin Nausea Only and Other (See Comments)    325 mg aspirin cause stomach upset  . Penicillins Rash   VITALS:  Blood pressure (!) 104/55, pulse 79, temperature 97.8 F (36.6 C), resp. rate 18, weight 106 kg, SpO2 95 %. PHYSICAL EXAMINATION:  Physical Exam  75 year old male lying in the bed in no acute distress Neck: Elevated JVD Lungs decreased breath sounds at the bases, rales present.  No wheezing Cardiovascular S1-S2 normal, no murmurs appreciated Neuro: Awake and alert.,  Nonfocal exam Abdomen: Firm and distended, nontender Extremities: ACE wrapped in bilateral  lower extremities Skin: Dry and flaky in lower extremities. LABORATORY PANEL:  Male CBC Recent Labs  Lab 01/26/21 0540  WBC 9.1  HGB 10.7*  HCT 35.6*  PLT 236   ------------------------------------------------------------------------------------------------------------------ Chemistries  Recent Labs  Lab 01/24/21 1405 01/25/21 0442 01/26/21 0540  NA  --    < > 139  K  --    < > 4.0  CL  --    < > 95*  CO2  --    < > 34*  GLUCOSE  --    < > 71  BUN  --    < > 25*  CREATININE  --    < > 1.08  CALCIUM  --    < > 8.2*  AST 20  --   --   ALT 18  --   --   ALKPHOS 67  --   --   BILITOT 1.1  --   --    < > = values in this interval not displayed.   RADIOLOGY:  US Venous Img Lower Bilateral (DVT)  Result Date: 01/26/2021 CLINICAL DATA:  Bilateral leg swelling EXAM: Bilateral LOWER EXTREMITY VENOUS DOPPLER ULTRASOUND TECHNIQUE: Gray-scale sonography with compression, as well as color and duplex ultrasound, were performed to evaluate the deep venous system(s) from the level of the common femoral vein through the popliteal and proximal calf veins. COMPARISON:  06/25/2019 FINDINGS: VENOUS Normal compressibility of the common femoral, superficial femoral, and popliteal veins, as well as the visualized calf veins. Visualized portions of profunda femoral vein and great saphenous vein unremarkable. No filling defects to suggest DVT on grayscale or color Doppler imaging.  Doppler waveforms show normal direction of venous flow, normal respiratory plasticity and response to augmentation. OTHER None. Limitations: none IMPRESSION: Negative. Electronically Signed   By: Donavan Foil M.D.   On: 01/26/2021 01:34   ASSESSMENT AND PLAN:  75 y.o. male with medical history significant for COPD with chronic respiratory failure on 2 L of oxygen, diabetes mellitus, hypertension and chronic venous stasis admitted for worsening shortness of breath from his baseline associated with bilateral lower extremity  swelling as well as increased abdominal girth   5/31: Admitted for worsening shortness of breath and edema 6/1: Worsening respiratory status and lethargy with ABG showing respiratory acidosis requiring BiPAP  6/2: Off BiPAP -weaned off to heated high flow at 50% FiO2 @ 60 L  Acute on chronic hypoxic respiratory failure Secondary to CHF, COPD, pulmonary hypertension Patient has a history of chronic respiratory failure and is on 2 L of oxygen continuous On heated high flow nasal cannula, as needed BiPAP  Acute on chronic diastolic dysfunction CHF Appreciate cardiology input.  Still significant volume overloaded Chest x-ray is consistent with CHF Lasix 40 mg IV every 8 hours, losartan 25 mg p.o. daily.  Continue metolazone 2.5 mg once daily and nitroglycerin patch - last 2D echocardiogram was from 2020 with an LVEF of 55 to 60%.,  Recheck echo shows normal LV function, moderately dilated RV with reduced function and significantly dilated IVC.  Cardiology planning right and/or left heart cath possibly tomorrow if oxygenation and kidney function improves -Continue Lipitor, metoprolol, Lovaza Strict I's and O's, daily weights Net IO Since Admission: -1,020 mL [01/26/21 1426]  Hyperkalemia Secondary to ARB use Resolved following diuretic therapy as well as Lokelma 6.1->3.9  Diabetes mellitus Maintain consistent carbohydrate diet Glycemic control with sliding scale insulin Hold metformin  History of COPD with chronic respiratory failure Not acutely exacerbated Continue as needed bronchodilator therapy as well as inhaled steroids PCCM consult  Obesity (BMI 37) Complicates overall prognosis and care  Chronic venous stasis in bilateral lower extremity swelling Patient has bilateral 3 layer compression wraps placed on 01/24/21 at vascular surgery office.  They are due for removal on 6/6 on follow-up with them and likely rewrap on 6/7 Follow-up with vascular surgery as outpatient as  scheduled Negative bilateral lower extremity Doppler for any DVT  Body mass index is 37.73 kg/m.  Net IO Since Admission: -1,020 mL [01/26/21 1423]      Status is: Inpatient  Remains inpatient appropriate because:IV treatments appropriate due to intensity of illness or inability to take PO   Dispo: The patient is from: Home              Anticipated d/c is to: Home              Patient currently is not medically stable to d/c.   Difficult to place patient No    DVT prophylaxis:        Lovenox    Family Communication: wife updated at bedside on 6/2   All the records are reviewed and case discussed with Care Management/Social Worker. Management plans discussed with the patient, family and they are in agreement.  CODE STATUS: Full Code Level of care: Stepdown  TOTAL TIME TAKING CARE OF THIS PATIENT: 35 minutes.   More than 50% of the time was spent in counseling/coordination of care: YES  POSSIBLE D/C IN 3-4 DAYS, DEPENDING ON CLINICAL CONDITION.   Max Sane M.D on 01/26/2021 at 2:23 PM  [facility]   CC: Primary care  physician; [name], MD  Note: This dictation was prepared with Dragon dictation along with smaller phrase technology. Any transcriptional errors that result from this process are unintentional.

## 2021-01-26 NOTE — Progress Notes (Signed)
Progress Note  Patient Name: Julian Sparks Date of Encounter: 01/26/2021  Primary Cardiologist: New to [facility] - consult by [facility]  Subjective   Breathing improved this morning compared to 6/1. Remains on HFNFC. Documented UOP ~ 1 L for the admission. No weight. Renal function stable. Echo pending. No chest pain. Slept in bed, not fully supine. Up sitting in recliner, waiting for breakfast.   Inpatient Medications    Scheduled Meds: . vitamin C  500 mg Oral Daily  . aspirin EC  81 mg Oral Daily  . atorvastatin  40 mg Oral Daily  . budesonide (PULMICORT) nebulizer solution  0.5 mg Nebulization BID  . enoxaparin (LOVENOX) injection  0.5 mg/kg Subcutaneous Q24H  . fluticasone  1 spray Each Nare BID  . furosemide  40 mg Intravenous Q8H  . gabapentin  300 mg Oral TID  . insulin aspart  0-20 Units Subcutaneous TID WC  . ipratropium-albuterol  3 mL Nebulization Q4H  . losartan  25 mg Oral Daily  . metolazone  2.5 mg Oral Once  . metoprolol succinate  12.5 mg Oral Daily  . mometasone-formoterol  2 puff Inhalation BID  . nitroGLYCERIN  1 inch Topical Q6H  . omega-3 acid ethyl esters  2 g Oral BID  . sodium chloride flush  3 mL Intravenous Q12H  . traZODone  100 mg Oral QHS   Continuous Infusions: . sodium chloride     PRN Meds: sodium chloride, acetaminophen, cyclobenzaprine, ondansetron (ZOFRAN) IV, sodium chloride flush   Vital Signs    Vitals:   01/26/21 0229 01/26/21 0401 01/26/21 0609 01/26/21 0742  BP:   111/65 (!) 114/57  Pulse:   80 83  Resp:   18   Temp:   99.1 F (37.3 C) 99.5 F (37.5 C)  TempSrc:   Oral Oral  SpO2: 92% 92% 95% 96%  Weight:        Intake/Output Summary (Last 24 hours) at 01/26/2021 0800 Last data filed at 01/26/2021 0631 Gross per 24 hour  Intake --  Output 350 ml  Net -350 ml   Filed Weights   01/24/21 1100 01/25/21 0500  Weight: 106.1 kg 106 kg    Telemetry    SR with PVCs - Personally Reviewed  ECG    NSR, 84 bpm, occasional  PVCs, rare PAC, poor R wave progression along the precoridal leads, possible prior inferior infarct, no acute st/t changes - Personally Reviewed  Physical Exam   GEN: No acute distress.   Neck: JVD elevated to the angle of the mandible. Cardiac: RRR, no murmurs, rubs, or gallops.  Respiratory: Markedly diminished breath sounds bilaterally with expiratory wheezing. On HFNC. GI: Soft, nontender, non-distended.   MS: 1+ bilateral lower extremity edema with legs wrapped; No deformity. Neuro:  Alert and oriented x 3; Nonfocal.  Psych: Normal affect.  Labs    Chemistry Recent Labs  Lab 01/24/21 1132 01/24/21 1405 01/25/21 0442 01/26/21 0540  NA 138  --  138 139  K 6.1*  --  3.9 4.0  CL 99  --  97* 95*  CO2 29  --  32 34*  GLUCOSE 123*  --  99 71  BUN 21  --  21 25*  CREATININE 0.90  --  0.94 1.08  CALCIUM 8.5*  --  8.4* 8.2*  PROT  --  6.9  --   --   ALBUMIN  --  3.6  --   --   AST  --  20  --   --  ALT  --  18  --   --   ALKPHOS  --  67  --   --   BILITOT  --  1.1  --   --   GFRNONAA >60  --  >60 >60  ANIONGAP 10  --  9 10     Hematology Recent Labs  Lab 01/24/21 1132 01/26/21 0540  WBC 10.2 9.1  RBC 3.88* 3.59*  HGB 11.8* 10.7*  HCT 38.4* 35.6*  MCV 99.0 99.2  MCH 30.4 29.8  MCHC 30.7 30.1  RDW 15.4 15.3  PLT 233 236    Cardiac EnzymesNo results for input(s): TROPONINI in the last 168 hours. No results for input(s): TROPIPOC in the last 168 hours.   BNP Recent Labs  Lab 01/25/21 0442  BNP 278.7*     DDimer No results for input(s): DDIMER in the last 168 hours.   Radiology    DG Chest 2 View  Result Date: 01/24/2021 IMPRESSION: Cardiomegaly bilateral interstitial prominence most consistent interstitial edema. Findings consistent with CHF. Electronically Signed   By: Marcello Moores  Register   On: 01/24/2021 11:30   US Venous Img Lower Bilateral (DVT)  Result Date: 01/26/2021 IMPRESSION: Negative. Electronically Signed   By: Donavan Foil M.D.   On:  01/26/2021 01:34    Cardiac Studies   2D echo 01/24/2021: Pending __________  2D echo 01/2019: 1. The left ventricle has normal systolic function, with an ejection  fraction of 55-60%. The cavity size was normal. There is mild concentric  left ventricular hypertrophy. Left ventricular diastolic function could  not be evaluated.  2. The right ventricle has normal systolic function. The cavity was  normal. There is no increase in right ventricular wall thickness.  3. Trivial pericardial effusion is present.  4. The mitral valve is grossly normal.  5. The tricuspid valve is grossly normal.  6. The aortic valve is grossly normal.  7. The inferior vena cava was dilated in size with >50% respiratory  variability.  8. very suboptimal study.   Patient Profile     75 y.o. male with history of chronic hypoxic and hypercapnic respiratory failure on supplemental oxygen, COPD, DM2, HTN, venous insufficiency, and obesity admitted on 5/31 with acute on chronic hypoxic and hypercapnic respiratory failure secondary to acute on chronic diastolic CHF, AECOPD, and hyperkalemia.   Assessment & Plan    1. Acute on chronic hypoxic and hypercapnic respiratory failure: -Multifactorial including acute on chronic diastolic CHF and AECOPD -Weaned from BiPAP, now on HFNC, continue to wean as tolerated  -May benefit from repeat ABG to assess CO2 and need for further BiPAP today -Wean off nitro paste  -Will need outpatient ischemic workup once his respiratory status is back to baseline  2. Acute on chronic diastolic CHF: -He remains volume up -Continue IV Lasix -Metolazone ordered for this morning -Echo pending, preliminary read consistent with RV dysfunction  -Pending respiratory status and renal function trend, may need to consider RHC this admission to better estimate volume status and PA pressures -Close monitoring of renal function -Daily weights -Strict I/O  3. HTN: -Blood pressure  currently well controlled -Continue current medical therapy  4. HLD: -Liptior  5. AECOPD: -Management per IM -Not currently receiving IV steroids, if needed, would use as short a course as possible to minimize fluid retention   For questions or updates, please contact [place] Please consult [email or web address] for contact info under Cardiology/STEMI.    Signed, Christell Faith, PA-C [place] Pager: [phone]  01/26/2021, 8:00 AM

## 2021-01-27 ENCOUNTER — Inpatient Hospital Stay: Payer: Medicare HMO

## 2021-01-27 DIAGNOSIS — I2609 Other pulmonary embolism with acute cor pulmonale: Secondary | ICD-10-CM | POA: Diagnosis not present

## 2021-01-27 DIAGNOSIS — I5033 Acute on chronic diastolic (congestive) heart failure: Secondary | ICD-10-CM | POA: Diagnosis not present

## 2021-01-27 LAB — BLOOD GAS, ARTERIAL
Acid-Base Excess: 12.8 mmol/L — ABNORMAL HIGH (ref 0.0–2.0)
Bicarbonate: 41.5 mmol/L — ABNORMAL HIGH (ref 20.0–28.0)
Delivery systems: POSITIVE
Expiratory PAP: 8
FIO2: 0.4
Inspiratory PAP: 16
O2 Saturation: 93.3 %
Patient temperature: 37
pCO2 arterial: 77 mmHg (ref 32.0–48.0)
pH, Arterial: 7.34 — ABNORMAL LOW (ref 7.350–7.450)
pO2, Arterial: 72 mmHg — ABNORMAL LOW (ref 83.0–108.0)

## 2021-01-27 LAB — BASIC METABOLIC PANEL
Anion gap: 10 (ref 5–15)
BUN: 31 mg/dL — ABNORMAL HIGH (ref 8–23)
CO2: 34 mmol/L — ABNORMAL HIGH (ref 22–32)
Calcium: 8.2 mg/dL — ABNORMAL LOW (ref 8.9–10.3)
Chloride: 95 mmol/L — ABNORMAL LOW (ref 98–111)
Creatinine, Ser: 1.1 mg/dL (ref 0.61–1.24)
GFR, Estimated: >60 mL/min (ref >60)
Glucose, Bld: 119 mg/dL — ABNORMAL HIGH (ref 70–99)
Potassium: 4 mmol/L (ref 3.5–5.1)
Sodium: 139 mmol/L (ref 135–145)

## 2021-01-27 LAB — CBC
HCT: 35.2 % — ABNORMAL LOW (ref 39.0–52.0)
Hemoglobin: 10.8 g/dL — ABNORMAL LOW (ref 13.0–17.0)
MCH: 30.4 pg (ref 26.0–34.0)
MCHC: 30.7 g/dL (ref 30.0–36.0)
MCV: 99.2 fL (ref 80.0–100.0)
Platelets: 227 10*3/uL (ref 150–400)
RBC: 3.55 MIL/uL — ABNORMAL LOW (ref 4.22–5.81)
RDW: 15.1 % (ref 11.5–15.5)
WBC: 9.6 10*3/uL (ref 4.0–10.5)
nRBC: 0 % (ref 0.0–0.2)

## 2021-01-27 LAB — GLUCOSE, CAPILLARY
Glucose-Capillary: 127 mg/dL — ABNORMAL HIGH (ref 70–99)
Glucose-Capillary: 108 mg/dL — ABNORMAL HIGH (ref 70–99)
Glucose-Capillary: 152 mg/dL — ABNORMAL HIGH (ref 70–99)
Glucose-Capillary: 155 mg/dL — ABNORMAL HIGH (ref 70–99)
Glucose-Capillary: 163 mg/dL — ABNORMAL HIGH (ref 70–99)

## 2021-01-27 LAB — EXPECTORATED SPUTUM ASSESSMENT W GRAM STAIN, RFLX TO RESP C

## 2021-01-27 LAB — MRSA PCR SCREENING: MRSA by PCR: POSITIVE — AB

## 2021-01-27 MED ORDER — METHYLPREDNISOLONE SODIUM SUCC 40 MG IJ SOLR
20.0000 mg | Freq: Two times a day (BID) | INTRAMUSCULAR | Status: DC
  Administered 2021-01-27 – 2021-01-29 (×6): 20 mg via INTRAVENOUS
  Filled 2021-01-27 (×6): qty 1

## 2021-01-27 MED ORDER — MUPIROCIN 2 % EX OINT
1.0000 "application " | TOPICAL_OINTMENT | Freq: Two times a day (BID) | CUTANEOUS | Status: AC
  Administered 2021-01-27 – 2021-01-31 (×10): 1 via NASAL
  Filled 2021-01-27: qty 22

## 2021-01-27 MED ORDER — IPRATROPIUM-ALBUTEROL 0.5-2.5 (3) MG/3ML IN SOLN
3.0000 mL | Freq: Four times a day (QID) | RESPIRATORY_TRACT | Status: DC
  Administered 2021-01-27 – 2021-01-31 (×14): 3 mL via RESPIRATORY_TRACT
  Filled 2021-01-27 (×14): qty 3

## 2021-01-27 MED ORDER — CLONAZEPAM 0.25 MG PO TBDP
0.2500 mg | ORAL_TABLET | Freq: Two times a day (BID) | ORAL | Status: DC | PRN
  Administered 2021-01-27 – 2021-02-07 (×3): 0.25 mg via ORAL
  Filled 2021-01-27 (×3): qty 1

## 2021-01-27 MED ORDER — MORPHINE SULFATE (PF) 2 MG/ML IV SOLN
2.0000 mg | INTRAVENOUS | Status: DC | PRN
  Administered 2021-01-27 – 2021-02-07 (×7): 2 mg via INTRAVENOUS
  Filled 2021-01-27 (×9): qty 1

## 2021-01-27 MED ORDER — HYDROXYZINE HCL 50 MG/ML IM SOLN
50.0000 mg | Freq: Once | INTRAMUSCULAR | Status: AC
  Administered 2021-01-27: 50 mg via INTRAMUSCULAR
  Filled 2021-01-27: qty 1

## 2021-01-27 MED ORDER — CHLORHEXIDINE GLUCONATE CLOTH 2 % EX PADS
6.0000 | MEDICATED_PAD | Freq: Every day | CUTANEOUS | Status: DC
  Administered 2021-01-27: 6 via TOPICAL

## 2021-01-27 MED ORDER — DEXMEDETOMIDINE HCL IN NACL 400 MCG/100ML IV SOLN
0.4000 ug/kg/h | INTRAVENOUS | Status: DC
Start: 2021-01-27 — End: 2021-01-30
  Administered 2021-01-27 (×2): 0.6 ug/kg/h via INTRAVENOUS
  Administered 2021-01-27 – 2021-01-28 (×3): 1 ug/kg/h via INTRAVENOUS
  Filled 2021-01-27 (×5): qty 100

## 2021-01-27 NOTE — Progress Notes (Signed)
GOALS OF CARE DISCUSSION  The Clinical status was relayed to family in detail. Daughter at bedside  Updated and notified of patients medical condition.    Patient remains unresponsive and will not open eyes to command-placed on precedex for increased agitation  Patient with increased WOB and using accessory muscles to breathe Explained to family course of therapy and the modalities  Patient placed on high flow [place]   Patient with Progressive multiorgan failure with a very high probablity of a very minimal chance of meaningful recovery despite all aggressive and optimal medical therapy.  PATIENT REMAINS FULL CODE  Family understands the situation. Will plan for family meeting tomorrow High risk for intubation and cardiac arrest  Family are satisfied with Plan of action and management. All questions answered  Additional CC time 25 mins   Keldrick Pomplun Patricia Pesa, M.D.  [facility]  Medical Director [place] Director [facility] Cardio-Pulmonary Department

## 2021-01-27 NOTE — Progress Notes (Signed)
Dr. Kennon Holter notified of patients increased agitation. Patients daughter at bedside and patient continues to try and take bipap off and pulling at wires and cords. Awaiting response.

## 2021-01-27 NOTE — Plan of Care (Signed)
Discussed in front of patient plan of care for te evening, pain management and purpose for [facility] with no evidence of learning.  Patient pulling out IV's and other equipment trying to leave with safety sitter added.  Problem: Health Behavior/Discharge Planning: Goal: Ability to manage health-related needs will improve Outcome: Progressing

## 2021-01-27 NOTE — Progress Notes (Signed)
Pt becoming restless on bipap, found to be tearing it off his face. Pt states "I can't wear this anymore. I feel like its choking me." Pt began coughing up thick tan phlegm, which progressed into blood tinged. Charge nurse called to bedside as well as ICU charge nurse. VS taken (see flowsheets.) Pt O2 sats dropped to low 60's during coughing spell. Pt placed back on bipap and slowly returned back to O2 sat in the 90's. NP notified of situation. New orders placed for xray and ABG. NP came to evaluate pt at bedside. Orders to transfer pt to ICU placed around 2 am. Pt transferred by this nurse and respiratory at approximately 2:15 am.

## 2021-01-27 NOTE — Progress Notes (Addendum)
1330  Patient extremely agitated. Unable to reason with patient. Patient will not cooperate even with daughter. Attempting OOB constantly. Patient now confused and unable to follow directions. After consulting with Dr. Mortimer Fries and new IV started per IV team, patient started on Precedex at .6 mcg/kg/min. Still extremely agitated and unable to follow commands. Bolus dose of  Precedx 1mg /kg given.1430 Patient remains agitated and short of breath.  Given Morphine 2 mg. Per order for extreme shortness of breath.1500 Calm and respirations even with oxygen saturations >88%. 1900 Became extremely agitated at change of shift. Pulled out left upper arm IV. Precedex increased to 1 mcg/kg/min. Family meeting planned for 1100 01/28/21 to discuss treatment plan.

## 2021-01-27 NOTE — Progress Notes (Signed)
[place] at [place] NAME: Julian Sparks    MR#:  [id number]  DATE OF BIRTH:  April 23, 1946  SUBJECTIVE:  CHIEF COMPLAINT:   Chief Complaint  Patient presents with  . Edema  . Shortness of Breath  Overnight, patient got agitated requiring transfer to ICU due to worsening respiratory status and cough with thick secretion resulting in hypoxia and not able to tolerate BiPAP.  Cough and shortness of breath present also wheezing REVIEW OF SYSTEMS:  Review of Systems  Constitutional: Negative for diaphoresis, fever, malaise/fatigue and weight loss.  HENT: Negative for ear discharge, ear pain, hearing loss, nosebleeds, sore throat and tinnitus.   Eyes: Negative for blurred vision and pain.  Respiratory: Positive for shortness of breath. Negative for cough, hemoptysis and wheezing.   Cardiovascular: Positive for orthopnea, leg swelling and PND. Negative for chest pain and palpitations.  Gastrointestinal: Negative for abdominal pain, blood in stool, constipation, diarrhea, heartburn, nausea and vomiting.  Genitourinary: Negative for dysuria, frequency and urgency.  Musculoskeletal: Negative for back pain and myalgias.  Skin: Negative for itching and rash.  Neurological: Negative for dizziness, tingling, tremors, focal weakness, seizures, weakness and headaches.  Psychiatric/Behavioral: Negative for depression. The patient is not nervous/anxious.     DRUG ALLERGIES:   Allergies  Allergen Reactions  . Aspirin Nausea Only and Other (See Comments)    325 mg aspirin cause stomach upset  . Penicillins Rash   VITALS:  Blood pressure 136/67, pulse (!) 101, temperature 99.2 F (37.3 C), temperature source Axillary, resp. rate (!) 22, weight 104.4 kg, SpO2 (!) 89 %. PHYSICAL EXAMINATION:  Physical Exam  75 year old male lying in the bed in no acute distress Neck: Elevated JVD Lungs decreased breath sounds at the bases, rales present.  Expiratory wheezing  + Cardiovascular S1-S2 normal, no murmurs appreciated Neuro: Awake and alert.,  Nonfocal exam Abdomen: Firm and distended, nontender Extremities: ACE wrapped in bilateral lower extremities Skin: Dry and flaky in lower extremities. LABORATORY PANEL:  Male CBC Recent Labs  Lab 01/27/21 0524  WBC 9.6  HGB 10.8*  HCT 35.2*  PLT 227   ------------------------------------------------------------------------------------------------------------------ Chemistries  Recent Labs  Lab 01/24/21 1405 01/25/21 0442 01/27/21 0524  NA  --    < > 139  K  --    < > 4.0  CL  --    < > 95*  CO2  --    < > 34*  GLUCOSE  --    < > 119*  BUN  --    < > 31*  CREATININE  --    < > 1.10  CALCIUM  --    < > 8.2*  AST 20  --   --   ALT 18  --   --   ALKPHOS 67  --   --   BILITOT 1.1  --   --    < > = values in this interval not displayed.   RADIOLOGY:  DG Chest 1 View  Result Date: 01/27/2021 CLINICAL DATA:  Shortness of breath EXAM: CHEST  1 VIEW COMPARISON:  01/24/2021 FINDINGS: Cardiomegaly with mild interstitial pulmonary edema, unchanged. No pleural effusion. IMPRESSION: Cardiomegaly and mild interstitial pulmonary edema. Electronically Signed   By: Ulyses Jarred M.D.   On: 01/27/2021 01:47   ASSESSMENT AND PLAN:  75 y.o. male with medical history significant for COPD with chronic respiratory failure on 2 L of oxygen, diabetes mellitus, hypertension and chronic  venous stasis admitted for worsening shortness of breath from his baseline associated with bilateral lower extremity swelling as well as increased abdominal girth   5/31: Admitted for worsening shortness of breath and edema 6/1: Worsening respiratory status and lethargy with ABG showing respiratory acidosis requiring BiPAP  6/2: Off BiPAP -weaned off to heated high flow at 50% FiO2 @ 60 L 6/3: Required transfer to ICU/stepdown due to worsening hypoxia from thick secretion which he was unable to clear resulting in hypoxia and unable to  tolerate BiPAP.  Now he is weaned off to heated high flow nasal cannula and seems comfortable   Acute on chronic hypoxic respiratory failure Secondary to CHF, COPD, pulmonary hypertension Patient has a history of chronic respiratory failure and is on 2 L of oxygen continuous On heated high flow nasal cannula, as needed BiPAP Worsened hypoxia, breathing and more agitation overnight requiring transfer to ICU/stepdown  Acute on chronic diastolic dysfunction CHF Appreciate cardiology input.  Still significant volume overloaded Chest x-ray is consistent with CHF Lasix 40 mg IV every 8 hours, losartan 25 mg p.o. daily.  Continue metolazone 2.5 mg once daily and nitroglycerin patch - last 2D echocardiogram was from 2020 with an LVEF of 55 to 60%.,  Recheck echo shows normal LV function, moderately dilated RV with reduced function and significantly dilated IVC.  Cardiology planning right and/or left heart cath once oxygenation and respiratory status improves - may be early next week -Continue Lipitor, metoprolol, Lovaza Strict I's and O's, daily weights Net IO Since Admission: -1,337 mL [01/27/21 1414]  Hyperkalemia Secondary to ARB use.  Now resolved Resolved following diuretic therapy as well as Lokelma  Diabetes mellitus Maintain consistent carbohydrate diet Glycemic control with sliding scale insulin Hold metformin  History of COPD with chronic respiratory failure -mild exacerbation at this time IV Solu-Medrol 20 mg twice daily added on 6/3 by PCCM Continue as needed bronchodilator therapy as well as inhaled steroids PCCM following  Obesity (BMI 37) Complicates overall prognosis and care  Chronic venous stasis in bilateral lower extremity swelling Patient has bilateral 3 layer compression wraps placed on 01/24/21 at vascular surgery office.  They are due for removal on 6/6 on follow-up with them and likely rewrap on 6/7 Follow-up with vascular surgery as outpatient as  scheduled Negative bilateral lower extremity Doppler for any DVT  Body mass index is 37.15 kg/m.  Net IO Since Admission: -1,337 mL [01/27/21 1414]      Status is: Inpatient  Remains inpatient appropriate because:IV treatments appropriate due to intensity of illness or inability to take PO and Inpatient level of care appropriate due to severity of illness   Dispo: The patient is from: Home              Anticipated d/c is to: Home              Patient currently is not medically stable to d/c.   Difficult to place patient No    DVT prophylaxis:        Lovenox    Family Communication: wife updated at bedside on 6/2   All the records are reviewed and case discussed with Care Management/Social Worker. Management plans discussed with the patient, nursing and they are in agreement.  CODE STATUS: Full Code Level of care: Stepdown  TOTAL TIME TAKING CARE OF THIS PATIENT: 35 minutes.   More than 50% of the time was spent in counseling/coordination of care: YES  POSSIBLE D/C IN 4-5 DAYS, DEPENDING ON CLINICAL  CONDITION.   Max Sane M.D on 01/27/2021 at 2:14 PM  [facility]   CC: Primary care physician; [name], MD  Note: This dictation was prepared with Dragon dictation along with smaller phrase technology. Any transcriptional errors that result from this process are unintentional.

## 2021-01-27 NOTE — Progress Notes (Addendum)
Julian Sparks, MRN:  [id number], DOB:  08-20-1946, LOS: 3 ADMISSION DATE:  01/24/2021 CONSULTATION DATE: 01/27/2021  REFERRING MD:  [facility] CHIEF COMPLAINT:  SOB and swelling   Brief Pt Description/Synopsis:  Severe ACUTE Hypoxic and Hypercapnic Respiratory Failure due to acute severe decompensated combined systloic and diastolic heart failure   History of Present Illness:   75 y.o. male with medical history significant for COPD with chronic respiratory failure on 2 L of oxygen +diabetes mellitus, hypertension and chronic venous stasis  presents to the emergency room for evaluationf worsening shortness of breath from his baseline associated with bilateral lower extremity swelling as well as increased abdominal girth for about 2 days. Has generalized anasarca +hypoxia + chronic venous stasis with ulceration and was seen at the vascular clinic where he had on arrival placed bilaterally to both lower extremities.   denies having any fever, no chills Respiratory viral panel is negative Chest x-ray reviewed by me shows cardiomegaly with bilateral interstitial prominence most consistent with CHF.    ED Course. Chest x-ray shows pulmonary edema and he has hyperkalemia with no EKG changes. He received a dose of Lasix in the ER as well as Lokelma and will be admitted to the hospital for further evaluation.   Significant Hospital Events: Including procedures, antibiotic start and stop dates in addition to other pertinent events   . 5/31 admitted for acute diastolic heart failure . 6/1  resp distress and mental status changes, placed on biPAP . 6/2: Respiratory status improved, weaned off BiPAP to HFNC 50% FiO2 @ 60L . 6/3: Overnight with agitation and unable to tolerate BiPAP with thick tan secretions, resulting in  hypoxia (O2 sats 60's) during coughing episode, transferred to Stepdown ~ now weaned back to HFNC, will add IV steroids for AECOPD   Interim History / Subjective:   -Overnight patient with agitation and unable to wear BiPAP resulting in hypoxia with O2 sats in the 60s -ABG with respiratory acidosis, chest x-ray with cardiomegaly and mild interstitial pulmonary edema -This morning weaned to [facility], currently comfortable and tolerating -Continues to report shortness of breath (but has improved), intermittent cough productive of tan sputum, and wheezing -Denies chest pain, palpitations, abdominal pain, N/V/D, fever, chills -Noted to have wheezing on exam ~ will add IV solumedrol 20 mg BID -Sputum culture collected   Antimicrobials:   Antibiotics Given (last 72 hours)    None         Objective   Blood pressure 136/79, pulse (!) 102, temperature 99.2 F (37.3 C), temperature source Axillary, resp. rate (!) 43, weight 104.4 kg, SpO2 95 %.    FiO2 (%):  [40 %-72 %] 72 %   Intake/Output Summary (Last 24 hours) at 01/27/2021 0941 Last data filed at 01/27/2021 0500 Gross per 24 hour  Intake 960 ml  Output 1400 ml  Net -440 ml   Filed Weights   01/24/21 1100 01/25/21 0500 01/27/21 0345  Weight: 106.1 kg 106 kg 104.4 kg      REVIEW OF SYSTEMS Positives in BOLD: Gen: Denies fever, chills, weight change, fatigue, night sweats HEENT: Denies blurred vision, double vision, hearing loss, tinnitus, sinus congestion, rhinorrhea, sore throat, neck stiffness, dysphagia PULM: Denies shortness of breath, cough, sputum production, hemoptysis, wheezing CV: Denies chest pain, edema, orthopnea, paroxysmal nocturnal dyspnea, palpitations GI: Denies abdominal pain, nausea, vomiting, diarrhea, hematochezia, melena, constipation, change in bowel habits GU: Denies dysuria, hematuria, polyuria, oliguria, urethral discharge Endocrine: Denies hot or cold intolerance, polyuria, polyphagia  or appetite change Derm: Denies rash, dry skin, scaling or peeling skin change Heme: Denies easy bruising, bleeding, bleeding gums Neuro: Denies headache, numbness, weakness, slurred  speech, loss of memory or consciousness    PHYSICAL EXAMINATION:  GENERAL: Acute on chronically ill-appearing male, sitting in recliner, on HHFNC, no acute distress HEAD: Normocephalic, atraumatic.  EYES: Pupils equal, round, reactive to light.  No scleral icterus.  MOUTH: Moist mucosal membrane. NECK: Supple, positive JVD PULMONARY: Coarse breath sounds bilaterally when expiratory wheezing, mild tachypnea, even CARDIOVASCULAR: S1 and S2. Regular rate and rhythm. No murmurs, rubs, or gallops.  GASTROINTESTINAL: Soft, nontender, -distended. Positive bowel sounds.  MUSCULOSKELETAL:1+edema bilateral lower extremity edema with legs wrapped, no deformities.  NEUROLOGIC: Awake, alert and oriented x3, moves all extremities to command, no focal deficits, speech clear, pupils PERRLA SKIN: Limited exam- intact,warm,dry   Labs/imaging that I havepersonally reviewed  (right click and "Reselect all SmartList Selections" daily)  Labs 01/26/2021: Chloride 95, bicarb 34, glucose 119, BUN 31, creatinine 1.1, WBC 9.6, hemoglobin 10.8 Chest x-ray 01/27/2021:Cardiomegaly with mild interstitial pulmonary edema, unchanged. No pleural effusion. Venous ultrasound bilateral lower extremities 6/2>> negative     ASSESSMENT / PLAN:    Severe ACUTE Hypoxic and Hypercapnic Respiratory Failure due to acute decompensated HFpEF and AECOPD PMHx of COPD -Supplemental O2 as needed to maintain O2 sats 88 to 94% -Currently on HHFNC and tolerating -BiPAP as needed -Follow intermittent chest x-ray and ABG as needed -Diuresis as blood pressure and renal function permits -Aggressive pulmonary toilet -Continue bronchodilators, budesonide nebs, and Dulera -Add IV solumedrol 20 mg BID today 01/27/21 -Pt reports cough productive of yellowish/tan sputum ~ Sputum culture obtained  Acute on Chronic HFpEF PMHx of Hypertension, Hyperlipidemia -Continuous cardiac monitoring -Maintain MAP greater than 65 -Cardiology following,  appreciate input -Diuresis as blood pressure and renal function permits (currently on 40 mg IV Lasix q8h as per Cardiology) -Echocardiogram performed, results pending        Labs   CBC: Recent Labs  Lab 01/24/21 1132 01/26/21 0540 01/27/21 0524  WBC 10.2 9.1 9.6  HGB 11.8* 10.7* 10.8*  HCT 38.4* 35.6* 35.2*  MCV 99.0 99.2 99.2  PLT 233 236 789    Basic Metabolic Panel: Recent Labs  Lab 01/24/21 1132 01/25/21 0442 01/26/21 0540 01/27/21 0524  NA 138 138 139 139  K 6.1* 3.9 4.0 4.0  CL 99 97* 95* 95*  CO2 29 32 34* 34*  GLUCOSE 123* 99 71 119*  BUN 21 21 25* 31*  CREATININE 0.90 0.94 1.08 1.10  CALCIUM 8.5* 8.4* 8.2* 8.2*   GFR: Estimated Creatinine Clearance: 64.6 mL/min (by C-G formula based on SCr of 1.1 mg/dL). Recent Labs  Lab 01/24/21 1132 01/26/21 0540 01/27/21 0524  WBC 10.2 9.1 9.6    Liver Function Tests: Recent Labs  Lab 01/24/21 1405  AST 20  ALT 18  ALKPHOS 67  BILITOT 1.1  PROT 6.9  ALBUMIN 3.6   No results for input(s): LIPASE, AMYLASE in the last 168 hours. No results for input(s): AMMONIA in the last 168 hours.  ABG    Component Value Date/Time   PHART 7.34 (L) 01/27/2021 0115   PCO2ART 77 (HH) 01/27/2021 0115   PO2ART 72 (L) 01/27/2021 0115   HCO3 41.5 (H) 01/27/2021 0115   O2SAT 93.3 01/27/2021 0115   HbA1C: Hgb A1c MFr Bld  Date/Time Value Ref Range Status  01/24/2021 11:32 AM 6.8 (H) 4.8 - 5.6 % Final    Comment:    (NOTE)  Prediabetes: 5.7 - 6.4         Diabetes: >6.4         Glycemic control for adults with diabetes: <7.0   02/05/2019 02:07 AM 7.3 (H) 4.8 - 5.6 % Final    Comment:    (NOTE) Pre diabetes:          5.7%-6.4% Diabetes:              >6.4% Glycemic control for   <7.0% adults with diabetes     CBG: Recent Labs  Lab 01/26/21 0739 01/26/21 1134 01/26/21 1712 01/26/21 2121 01/27/21 0735  GLUCAP 70 197* 164* 153* 108*    Allergies Allergies  Allergen Reactions  . Aspirin Nausea  Only and Other (See Comments)    325 mg aspirin cause stomach upset  . Penicillins Rash       Darel Hong, AGACNP-BC [facility] Prefer epic messenger for cross cover needs If after hours, please call E-link

## 2021-01-27 NOTE — Progress Notes (Signed)
Progress Note  Patient Name: Julian Sparks Date of Encounter: 01/27/2021  Primary Cardiologist: New to [facility] - consult by Rockey Situ  Subjective   Did not receive ordered metolazone 6/2. Transferred to the ICU overnight with agitation and refusal of BiPAP leading to hypoxia with O2 saturations in the 60s. ABG overnight with a pCO2 of 77 and p O2 72. CXR with cardiomegaly and mild interstitial pulmonary edema. In the ICU, currently on HFNC. HGB low, though stable. BUN/SCr 25/1.08-->31/1.10. Documented UOP 44 mL for the past 24 hours with a net - 1.3 L for the admission. Weight 106/4-->104.4 kg over the past 48 hours. Now on IV Lasix 40 mg q 8 hours. He feels like his respiratory status continues to improve.  Inpatient Medications    Scheduled Meds: . vitamin C  500 mg Oral Daily  . aspirin EC  81 mg Oral Daily  . atorvastatin  40 mg Oral Daily  . budesonide (PULMICORT) nebulizer solution  0.5 mg Nebulization BID  . Chlorhexidine Gluconate Cloth  6 each Topical Q0600  . enoxaparin (LOVENOX) injection  0.5 mg/kg Subcutaneous Q24H  . fluticasone  1 spray Each Nare BID  . furosemide  40 mg Intravenous Q8H  . gabapentin  300 mg Oral TID  . insulin aspart  0-20 Units Subcutaneous TID WC  . ipratropium-albuterol  3 mL Nebulization Q6H  . losartan  25 mg Oral Daily  . methylPREDNISolone (SOLU-MEDROL) injection  20 mg Intravenous Q12H  . metoprolol succinate  12.5 mg Oral Daily  . mometasone-formoterol  2 puff Inhalation BID  . mupirocin ointment  1 application Nasal BID  . nitroGLYCERIN  1 inch Topical Q6H  . omega-3 acid ethyl esters  2 g Oral BID  . sodium chloride flush  3 mL Intravenous Q12H  . traZODone  100 mg Oral QHS   Continuous Infusions: . sodium chloride     PRN Meds: sodium chloride, acetaminophen, cyclobenzaprine, guaiFENesin-dextromethorphan, morphine injection, ondansetron (ZOFRAN) IV, sodium chloride flush   Vital Signs    Vitals:   01/27/21 0700 01/27/21 0743  01/27/21 0802 01/27/21 0943  BP: 136/79   136/67  Pulse: (!) 102   93  Resp:      Temp:  99.2 F (37.3 C)    TempSrc:  Axillary    SpO2: 99% 95% 95%   Weight:        Intake/Output Summary (Last 24 hours) at 01/27/2021 1025 Last data filed at 01/27/2021 0944 Gross per 24 hour  Intake 723 ml  Output 1400 ml  Net -677 ml   Filed Weights   01/24/21 1100 01/25/21 0500 01/27/21 0345  Weight: 106.1 kg 106 kg 104.4 kg    Telemetry    SR with PVCs, 4 beat run of NSVT, artifact - Personally Reviewed  ECG    No new tracings - Personally Reviewed  Physical Exam   GEN: No acute distress.   Neck: JVD elevated to the angle of the mandible. Cardiac: RRR, no murmurs, rubs, or gallops.  Respiratory: Markedly diminished breath sounds bilaterally with expiratory wheezing. On HFNC. GI: Soft, nontender, non-distended.   MS: Trace bilateral pretibial lower extremity edema with legs wrapped; No deformity. Neuro:  Alert and oriented x 3; Nonfocal.  Psych: Normal affect.  Labs    Chemistry Recent Labs  Lab 01/24/21 1405 01/25/21 0442 01/26/21 0540 01/27/21 0524  NA  --  138 139 139  K  --  3.9 4.0 4.0  CL  --  97*  95* 95*  CO2  --  32 34* 34*  GLUCOSE  --  99 71 119*  BUN  --  21 25* 31*  CREATININE  --  0.94 1.08 1.10  CALCIUM  --  8.4* 8.2* 8.2*  PROT 6.9  --   --   --   ALBUMIN 3.6  --   --   --   AST 20  --   --   --   ALT 18  --   --   --   ALKPHOS 67  --   --   --   BILITOT 1.1  --   --   --   GFRNONAA  --  >60 >60 >60  ANIONGAP  --  9 10 10      Hematology Recent Labs  Lab 01/24/21 1132 01/26/21 0540 01/27/21 0524  WBC 10.2 9.1 9.6  RBC 3.88* 3.59* 3.55*  HGB 11.8* 10.7* 10.8*  HCT 38.4* 35.6* 35.2*  MCV 99.0 99.2 99.2  MCH 30.4 29.8 30.4  MCHC 30.7 30.1 30.7  RDW 15.4 15.3 15.1  PLT 233 236 227    Cardiac EnzymesNo results for input(s): TROPONINI in the last 168 hours. No results for input(s): TROPIPOC in the last 168 hours.   BNP Recent Labs  Lab  01/25/21 0442  BNP 278.7*     DDimer No results for input(s): DDIMER in the last 168 hours.   Radiology    DG Chest 2 View  Result Date: 01/24/2021 IMPRESSION: Cardiomegaly bilateral interstitial prominence most consistent interstitial edema. Findings consistent with CHF. Electronically Signed   By: Marcello Moores  Register   On: 01/24/2021 11:30   US Venous Img Lower Bilateral (DVT)  Result Date: 01/26/2021 IMPRESSION: Negative. Electronically Signed   By: Donavan Foil M.D.   On: 01/26/2021 01:34    Cardiac Studies   2D echo 01/24/2021: Pending __________  2D echo 01/2019: 1. The left ventricle has normal systolic function, with an ejection  fraction of 55-60%. The cavity size was normal. There is mild concentric  left ventricular hypertrophy. Left ventricular diastolic function could  not be evaluated.  2. The right ventricle has normal systolic function. The cavity was  normal. There is no increase in right ventricular wall thickness.  3. Trivial pericardial effusion is present.  4. The mitral valve is grossly normal.  5. The tricuspid valve is grossly normal.  6. The aortic valve is grossly normal.  7. The inferior vena cava was dilated in size with >50% respiratory  variability.  8. very suboptimal study.   Patient Profile     75 y.o. male with history of chronic hypoxic and hypercapnic respiratory failure on supplemental oxygen, COPD, DM2, HTN, venous insufficiency, and obesity admitted on 5/31 with acute on chronic hypoxic and hypercapnic respiratory failure secondary to acute on chronic diastolic CHF, AECOPD, and hyperkalemia.   Assessment & Plan    1. Acute on chronic hypoxic and hypercapnic respiratory failure: -Transferred to the ICU overnight with noted hypoxia in the context of agitation and refusal to wear BiPAP -Multifactorial including acute on chronic diastolic CHF and AECOPD -Weaned from BiPAP, now on HFNC, continue to wean as tolerated  -CO2 remains  elevated on ABG early this morning, though is improving with BiPAP use -Wean off nitro paste  -Will need ischemic workup once his respiratory status is improved as outline below  2. Acute on chronic diastolic CHF: -He remains volume up  -Continue IV Lasix 40 mg q 8 hours -He did  not receive metolazone 6/2 -Echo remains pending, preliminary read consistent with RV dysfunction, will reach out to the echo tech to see who this study is assigned to and if needed change over to [facility] -He will benefit from a RHC at a minimum, and ideally a R/LHC if renal function allows, possibly early next week once his respiratory status is improved -Close monitoring of renal function -Daily weights -Strict I/O  3. HTN: -Blood pressure currently well controlled -Continue current medical therapy  4. HLD: -Liptior  5. AECOPD: -Management per IM -IV steroids, wean when possible to minimize fluids rentention    For questions or updates, please contact [place] Please consult [email or web address] for contact info under Cardiology/STEMI.    Signed, Christell Faith, PA-C [place] Pager: [phone] 01/27/2021, 10:25 AM

## 2021-01-27 NOTE — Progress Notes (Signed)
Patient admitted to ICU bed 10, daughter is at bedside also. Pt is wearing bipap, alert to self, place, and situation.

## 2021-01-27 NOTE — Progress Notes (Addendum)
D/w Dr Mortimer Fries. He will take over service at this point with patient's overall decompensation in anticipation of possible intubation/cardiac arrest. TRH will sign off.

## 2021-01-28 DIAGNOSIS — I5033 Acute on chronic diastolic (congestive) heart failure: Secondary | ICD-10-CM | POA: Diagnosis not present

## 2021-01-28 DIAGNOSIS — I2609 Other pulmonary embolism with acute cor pulmonale: Secondary | ICD-10-CM | POA: Diagnosis not present

## 2021-01-28 LAB — MAGNESIUM: Magnesium: 2.2 mg/dL (ref 1.7–2.4)

## 2021-01-28 LAB — BASIC METABOLIC PANEL
Anion gap: 11 (ref 5–15)
BUN: 40 mg/dL — ABNORMAL HIGH (ref 8–23)
CO2: 36 mmol/L — ABNORMAL HIGH (ref 22–32)
Calcium: 8.6 mg/dL — ABNORMAL LOW (ref 8.9–10.3)
Chloride: 96 mmol/L — ABNORMAL LOW (ref 98–111)
Creatinine, Ser: 1.15 mg/dL (ref 0.61–1.24)
GFR, Estimated: >60 mL/min (ref >60)
Glucose, Bld: 185 mg/dL — ABNORMAL HIGH (ref 70–99)
Potassium: 5.6 mmol/L — ABNORMAL HIGH (ref 3.5–5.1)
Sodium: 143 mmol/L (ref 135–145)

## 2021-01-28 LAB — CBC
HCT: 41 % (ref 39.0–52.0)
Hemoglobin: 12.1 g/dL — ABNORMAL LOW (ref 13.0–17.0)
MCH: 29.3 pg (ref 26.0–34.0)
MCHC: 29.5 g/dL — ABNORMAL LOW (ref 30.0–36.0)
MCV: 99.3 fL (ref 80.0–100.0)
Platelets: 221 10*3/uL (ref 150–400)
RBC: 4.13 MIL/uL — ABNORMAL LOW (ref 4.22–5.81)
RDW: 14.6 % (ref 11.5–15.5)
WBC: 6.8 10*3/uL (ref 4.0–10.5)
nRBC: 0 % (ref 0.0–0.2)

## 2021-01-28 LAB — POTASSIUM: Potassium: 5.7 mmol/L — ABNORMAL HIGH (ref 3.5–5.1)

## 2021-01-28 LAB — GLUCOSE, CAPILLARY
Glucose-Capillary: 185 mg/dL — ABNORMAL HIGH (ref 70–99)
Glucose-Capillary: 212 mg/dL — ABNORMAL HIGH (ref 70–99)
Glucose-Capillary: 207 mg/dL — ABNORMAL HIGH (ref 70–99)
Glucose-Capillary: 129 mg/dL — ABNORMAL HIGH (ref 70–99)

## 2021-01-28 LAB — PHOSPHORUS: Phosphorus: 5.1 mg/dL — ABNORMAL HIGH (ref 2.5–4.6)

## 2021-01-28 MED ORDER — INSULIN ASPART 100 UNIT/ML IJ SOLN
0.0000 [IU] | Freq: Three times a day (TID) | INTRAMUSCULAR | Status: DC
  Administered 2021-01-28: 3 [IU] via SUBCUTANEOUS
  Administered 2021-01-29: 11 [IU] via SUBCUTANEOUS
  Administered 2021-01-29: 7 [IU] via SUBCUTANEOUS
  Administered 2021-01-29: 11 [IU] via SUBCUTANEOUS
  Administered 2021-01-29: 7 [IU] via SUBCUTANEOUS
  Administered 2021-01-30: 4 [IU] via SUBCUTANEOUS
  Administered 2021-01-30 (×2): 11 [IU] via SUBCUTANEOUS
  Administered 2021-01-30: 7 [IU] via SUBCUTANEOUS
  Administered 2021-01-31: 3 [IU] via SUBCUTANEOUS
  Administered 2021-01-31: 11 [IU] via SUBCUTANEOUS
  Administered 2021-01-31 – 2021-02-01 (×3): 3 [IU] via SUBCUTANEOUS
  Administered 2021-02-01: 7 [IU] via SUBCUTANEOUS
  Administered 2021-02-01: 11 [IU] via SUBCUTANEOUS
  Administered 2021-02-01: 15 [IU] via SUBCUTANEOUS
  Administered 2021-02-02: 7 [IU] via SUBCUTANEOUS
  Administered 2021-02-02: 3 [IU] via SUBCUTANEOUS
  Administered 2021-02-02 (×2): 11 [IU] via SUBCUTANEOUS
  Administered 2021-02-03 (×2): 15 [IU] via SUBCUTANEOUS
  Administered 2021-02-03 (×2): 4 [IU] via SUBCUTANEOUS
  Administered 2021-02-04 (×2): 15 [IU] via SUBCUTANEOUS
  Administered 2021-02-04 (×2): 11 [IU] via SUBCUTANEOUS
  Filled 2021-01-28 (×29): qty 1

## 2021-01-28 MED ORDER — CHLORHEXIDINE GLUCONATE CLOTH 2 % EX PADS
6.0000 | MEDICATED_PAD | Freq: Every day | CUTANEOUS | Status: DC
  Administered 2021-01-28 – 2021-02-13 (×12): 6 via TOPICAL

## 2021-01-28 NOTE — Progress Notes (Addendum)
Progress Note  Patient Name: Julian Sparks Date of Encounter: 01/28/2021  Primary Cardiologist: New to [facility] - consult by [place]  Subjective   No chest pain, dyspnea largely unchanged. Refused BiPAP overnight. Weight 104.4-->103 kg over the past 48 hours. Documented UOP 1.5 L for the past 24 hours with a net - 2.8 L for the admission. BUN/SCr 31/1.10-->40/1.15 over the past 24 hours. He continues to be confused and agitated at times, requiring Precedex.  Inpatient Medications    Scheduled Meds: . vitamin C  500 mg Oral Daily  . aspirin EC  81 mg Oral Daily  . atorvastatin  40 mg Oral Daily  . budesonide (PULMICORT) nebulizer solution  0.5 mg Nebulization BID  . Chlorhexidine Gluconate Cloth  6 each Topical Q0600  . enoxaparin (LOVENOX) injection  0.5 mg/kg Subcutaneous Q24H  . fluticasone  1 spray Each Nare BID  . furosemide  40 mg Intravenous Q8H  . gabapentin  300 mg Oral TID  . insulin aspart  0-20 Units Subcutaneous TID WC  . ipratropium-albuterol  3 mL Nebulization Q6H  . losartan  25 mg Oral Daily  . methylPREDNISolone (SOLU-MEDROL) injection  20 mg Intravenous Q12H  . metoprolol succinate  12.5 mg Oral Daily  . mometasone-formoterol  2 puff Inhalation BID  . mupirocin ointment  1 application Nasal BID  . omega-3 acid ethyl esters  2 g Oral BID  . sodium chloride flush  3 mL Intravenous Q12H  . traZODone  100 mg Oral QHS   Continuous Infusions: . sodium chloride    . dexmedetomidine (PRECEDEX) IV infusion 1 mcg/kg/hr (01/28/21 0523)   PRN Meds: sodium chloride, acetaminophen, clonazepam, cyclobenzaprine, guaiFENesin-dextromethorphan, morphine injection, ondansetron (ZOFRAN) IV, sodium chloride flush   Vital Signs    Vitals:   01/28/21 0300 01/28/21 0400 01/28/21 0500 01/28/21 0600  BP: 117/64 111/73 123/63   Pulse: (!) 52 (!) 52 (!) 50 (!) 53  Resp: 17 14 17 20   Temp: 97.9 F (36.6 C)     TempSrc: Axillary     SpO2: 96% 98% 97% 94%  Weight:   103 kg      Intake/Output Summary (Last 24 hours) at 01/28/2021 0754 Last data filed at 01/28/2021 0600 Gross per 24 hour  Intake 883.09 ml  Output 2400 ml  Net -1516.91 ml   Filed Weights   01/27/21 0345 01/27/21 0800 01/28/21 0500  Weight: 104.4 kg 104.4 kg 103 kg    Telemetry    SR - Personally Reviewed  ECG    No new tracings - Personally Reviewed  Physical Exam   GEN: No acute distress.   Neck: JVD elevated ~ 8 cm. Cardiac: RRR, no murmurs, rubs, or gallops.  Respiratory: Continued diminished breath sounds bilaterally with expiratory wheezing. On HFNC. GI: Soft, nontender, non-distended.   MS: Trace bilateral pretibial lower extremity edema with legs wrapped; No deformity. Neuro:  Alert and oriented x 3; Nonfocal.  Psych: Normal affect.  Labs    Chemistry Recent Labs  Lab 01/24/21 1405 01/25/21 0442 01/26/21 0540 01/27/21 0524 01/28/21 0439  NA  --    < > 139 139 143  K  --    < > 4.0 4.0 5.6*  CL  --    < > 95* 95* 96*  CO2  --    < > 34* 34* 36*  GLUCOSE  --    < > 71 119* 185*  BUN  --    < > 25* 31* 40*  CREATININE  --    < > 1.08 1.10 1.15  CALCIUM  --    < > 8.2* 8.2* 8.6*  PROT 6.9  --   --   --   --   ALBUMIN 3.6  --   --   --   --   AST 20  --   --   --   --   ALT 18  --   --   --   --   ALKPHOS 67  --   --   --   --   BILITOT 1.1  --   --   --   --   GFRNONAA  --    < > >60 >60 >60  ANIONGAP  --    < > 10 10 11    < > = values in this interval not displayed.     Hematology Recent Labs  Lab 01/26/21 0540 01/27/21 0524 01/28/21 0439  WBC 9.1 9.6 6.8  RBC 3.59* 3.55* 4.13*  HGB 10.7* 10.8* 12.1*  HCT 35.6* 35.2* 41.0  MCV 99.2 99.2 99.3  MCH 29.8 30.4 29.3  MCHC 30.1 30.7 29.5*  RDW 15.3 15.1 14.6  PLT 236 227 221    Cardiac EnzymesNo results for input(s): TROPONINI in the last 168 hours. No results for input(s): TROPIPOC in the last 168 hours.   BNP Recent Labs  Lab 01/25/21 0442  BNP 278.7*     DDimer No results for input(s):  DDIMER in the last 168 hours.   Radiology    DG Chest 2 View  Result Date: 01/24/2021 IMPRESSION: Cardiomegaly bilateral interstitial prominence most consistent interstitial edema. Findings consistent with CHF. Electronically Signed   By: Marcello Moores  Register   On: 01/24/2021 11:30   US Venous Img Lower Bilateral (DVT)  Result Date: 01/26/2021 IMPRESSION: Negative. Electronically Signed   By: Donavan Foil M.D.   On: 01/26/2021 01:34    Cardiac Studies   2D echo 01/24/2021: Pending __________  2D echo 01/2019: 1. The left ventricle has normal systolic function, with an ejection  fraction of 55-60%. The cavity size was normal. There is mild concentric  left ventricular hypertrophy. Left ventricular diastolic function could  not be evaluated.  2. The right ventricle has normal systolic function. The cavity was  normal. There is no increase in right ventricular wall thickness.  3. Trivial pericardial effusion is present.  4. The mitral valve is grossly normal.  5. The tricuspid valve is grossly normal.  6. The aortic valve is grossly normal.  7. The inferior vena cava was dilated in size with >50% respiratory  variability.  8. very suboptimal study.   Patient Profile     75 y.o. male with history of chronic hypoxic and hypercapnic respiratory failure on supplemental oxygen, COPD, DM2, HTN, venous insufficiency, and obesity admitted on 5/31 with acute on chronic hypoxic and hypercapnic respiratory failure secondary to acute on chronic diastolic CHF, AECOPD, and hyperkalemia.   Assessment & Plan    1. Acute on chronic hypoxic and hypercapnic respiratory failure: -Multifactorial including acute on chronic diastolic CHF and AECOPD -HFNC, wean at tolerated -May need to consider placing back on BiPAP for CO2 retention  -Will need ischemic workup once his respiratory status is improved as outline below  2. Right-sided volume overload: -He remains volume up, though had good UOP  over the past 24 hours with ~ 2 L as some urine was lost due to bed wetting -Consider Foley, defer to CCM -Continue  IV Lasix 40 mg q 8 hours -Reassess daily for need to start low dose milrinone for RV support to assist with augmentation of diuresis, will discuss with MD -Echo has been read, though is not crossing over to Epic, a ticket has been placed. It showed normal LVSF, moderately dilated RV and RA with a significantly dilated IVC. PA pressure could not be estimated. Diastolic function appear normal -He will benefit from a RHC at a minimum, and ideally a R/LHC if renal function allows, possibly early next week once his respiratory status is improved -Close monitoring of renal function -Daily weights -Strict I/O -Overall prognosis appears to be quite poor, remains a full code  3. HTN: -Blood pressure soft -Hold metoprolol and losartan this morning   4. HLD: -Liptior  5. AECOPD: -Management per IM -IV steroids, wean when possible to minimize fluids rentention    For questions or updates, please contact [place] Please consult [email or web address] for contact info under Cardiology/STEMI.    Signed, Christell Faith, PA-C [place] Pager: [phone] 01/28/2021, 7:54 AM  Cardiology Attending  Patient seen and examined. Agree with the findings as noted above. The patient has had a nice improvement over the past 24 hours. He no longer appears acutely volume overloaded after IV lasix. He is awake and alert. His lungs reveal scattered basilar rales but no edema. His tele demonstrates nsr and his weight is down a Kg. We will continue IV lasix and supportive care. His pressure a little soft so it is appropriate to hold his bp lowering meds. No need for IV milrinone at this point.   Carleene Overlie Karrisa Didio,MD

## 2021-01-28 NOTE — Progress Notes (Signed)
GOALS OF CARE DISCUSSION  The Clinical status was relayed to family in detail. All family in waiting room  Updated and notified of patients medical condition.   Patient with increased WOB and using accessory muscles to breathe, remains on 50L High flow [place] 85% fio2 Previous dx of COVID  Explained to family course of therapy and the modalities    Patient with Progressive multiorgan failure with a very high probablity of a very minimal chance of meaningful recovery despite all aggressive and optimal medical therapy.  PATIENT HAS BEEN DECLINING OVER LAST SEVERAL MONTHS  PATIENT HAS EXPRESSED THAT HE IS TIRED AND ONLY DOING THIS FOR HIS GRAND-DAUGHTER I have relayed this to family in detail  Family understands the situation.  They have consented and agreed to [place] NO CPR  Family are satisfied with Plan of action and management. All questions answered  Additional CC time 35 mins   Christon Parada Patricia Pesa, M.D.  [facility]  Medical Director [place] Director [facility] Cardio-Pulmonary Department

## 2021-01-28 NOTE — Progress Notes (Signed)
NAME:  Julian Sparks, MRN:  [id number], DOB:  1945/08/29, LOS: 4 ADMISSION DATE:  01/24/2021, INITIAL CONSULTATION DATE: 01/27/2021 REFERRING MD: Max Sane MD, CHIEF COMPLAINT: Worsening SOB  Brief Patient Description  75 y.o.malewith PMH as below presenting to the ED worsening shortness of breath from his baseline associated with bilateral lower extremity swelling as well as increased abdominal girth for about 2 days. Has generalized anasarca +hypoxia + chronic venous stasis with ulceration and was seen at the vascular clinic where he had on arrival placed bilaterally to both lower extremities. He was admitted to PCU with Severe ACUTE Hypoxic and Hypercapnic Respiratory Failure due to acute severe decompensated combined systloic and diastolic heart failure on BiPAP  Pertinent  Medical History    Chronic bronchitis ([place])  . COPD (chronic obstructive pulmonary disease) ([place])  . Diabetes mellitus without complication ([place])  . Diastolic dysfunction  . Dyspnea   Chronic venous statis  . Hypertension   Significant Hospital Events: Including procedures, antibiotic start and stop dates in addition to other pertinent events   5/31: Admitted for acute diastolic heart failure 6/1: Resp distress and mental status changes, placed on biPAP 6/2: Respiratory status improved, weaned off BiPAP to HFNC 50% FiO2 @ 60L 6/3: Overnight with agitation and unable to tolerate BiPAP with thick tan secretions, resulting in  hypoxia (O2 sats 60's) during coughing episode, transferred to [facility] ~ now weaned back to HFNC, add IV steroids for AECOPD  Cultures:  01/24/2021: SARS-CoV-2 PCR>> negative 01/24/2021: Influenza PCR>> negative 01/27/2021: MRSA PCR>> negative   Antimicrobials:  None required Interim History / Subjective:  - Sitting up in bed this morning eating breakfast -Refused BiPAP overnight, on HHFNC this morning -Remains on low dose Precedex for agitation  OBJECTIVE   Blood pressure 123/63,  pulse (!) 53, temperature 97.9 F (36.6 C), temperature source Axillary, resp. rate 20, weight 103 kg, SpO2 94 %.    FiO2 (%):  [85 %-93 %] 85 %   Intake/Output Summary (Last 24 hours) at 01/28/2021 0831 Last data filed at 01/28/2021 0600 Gross per 24 hour  Intake 640.09 ml  Output 2200 ml  Net -1559.91 ml   Filed Weights   01/27/21 0345 01/27/21 0800 01/28/21 0500  Weight: 104.4 kg 104.4 kg 103 kg    Physical Examination: GENERAL: Acute on chronically ill-appearing male, sitting in BED, on HHFNC, no acute distress HEAD: Normocephalic, atraumatic.  EYES: Pupils equal, round, reactive to light.  No scleral icterus.  MOUTH: Moist mucosal membrane. NECK: Supple, positive JVD PULMONARY: Coarse breath sounds bilaterally when expiratory wheezing, mild tachypnea, even CARDIOVASCULAR: S1 and S2. Regular rate and rhythm. No murmurs, rubs, or gallops.  GASTROINTESTINAL: Soft, nontender, -distended. Positive bowel sounds.  MUSCULOSKELETAL:1+edema bilateral lower extremity edema with legs wrapped, no deformities.  NEUROLOGIC: Awake, alert and oriented x3, moves all extremities to command, no focal deficits, speech clear, pupils PERRLA SKIN: Limited exam- intact,warm,dry  Labs/imaging that I havepersonally reviewed  (right click and "Reselect all SmartList Selections" daily)  Labs 01/26/2021: Chloride 95, bicarb 34, glucose 119, BUN 31, creatinine 1.1, WBC 9.6, hemoglobin 10.8 Chest x-ray 01/27/2021:Cardiomegaly with mild interstitial pulmonary edema, unchanged. No pleural effusion. Venous ultrasound bilateral lower extremities 6/2>> negative  Labs   CBC: Recent Labs  Lab 01/24/21 1132 01/26/21 0540 01/27/21 0524 01/28/21 0439  WBC 10.2 9.1 9.6 6.8  HGB 11.8* 10.7* 10.8* 12.1*  HCT 38.4* 35.6* 35.2* 41.0  MCV 99.0 99.2 99.2 99.3  PLT 233 236 227 221  Basic Metabolic Panel: Recent Labs  Lab 01/24/21 1132 01/25/21 0442 01/26/21 0540 01/27/21 0524 01/28/21 0439  NA 138 138 139  139 143  K 6.1* 3.9 4.0 4.0 5.6*  CL 99 97* 95* 95* 96*  CO2 29 32 34* 34* 36*  GLUCOSE 123* 99 71 119* 185*  BUN 21 21 25* 31* 40*  CREATININE 0.90 0.94 1.08 1.10 1.15  CALCIUM 8.5* 8.4* 8.2* 8.2* 8.6*  MG  --   --   --   --  2.2  PHOS  --   --   --   --  5.1*   GFR: Estimated Creatinine Clearance: 61.4 mL/min (by C-G formula based on SCr of 1.15 mg/dL). Recent Labs  Lab 01/24/21 1132 01/26/21 0540 01/27/21 0524 01/28/21 0439  WBC 10.2 9.1 9.6 6.8    Liver Function Tests: Recent Labs  Lab 01/24/21 1405  AST 20  ALT 18  ALKPHOS 67  BILITOT 1.1  PROT 6.9  ALBUMIN 3.6   No results for input(s): LIPASE, AMYLASE in the last 168 hours. No results for input(s): AMMONIA in the last 168 hours.  ABG    Component Value Date/Time   PHART 7.34 (L) 01/27/2021 0115   PCO2ART 77 (HH) 01/27/2021 0115   PO2ART 72 (L) 01/27/2021 0115   HCO3 41.5 (H) 01/27/2021 0115   O2SAT 93.3 01/27/2021 0115     Coagulation Profile: No results for input(s): INR, PROTIME in the last 168 hours.  Cardiac Enzymes: No results for input(s): CKTOTAL, CKMB, CKMBINDEX, TROPONINI in the last 168 hours.  HbA1C: Hgb A1c MFr Bld  Date/Time Value Ref Range Status  01/24/2021 11:32 AM 6.8 (H) 4.8 - 5.6 % Final    Comment:    (NOTE)         Prediabetes: 5.7 - 6.4         Diabetes: >6.4         Glycemic control for adults with diabetes: <7.0   02/05/2019 02:07 AM 7.3 (H) 4.8 - 5.6 % Final    Comment:    (NOTE) Pre diabetes:          5.7%-6.4% Diabetes:              >6.4% Glycemic control for   <7.0% adults with diabetes     CBG: Recent Labs  Lab 01/27/21 0735 01/27/21 1125 01/27/21 1629 01/27/21 2256 01/28/21 0729  GLUCAP 108* 152* 155* 163* 185*    Allergies Allergies  Allergen Reactions  . Aspirin Nausea Only and Other (See Comments)    325 mg aspirin cause stomach upset  . Penicillins Rash     Home Medications  Prior to Admission medications   Medication Sig Start Date  End Date Taking? Authorizing Provider  ACCU-CHEK AVIVA PLUS test strip TEST BLOOD SUGAR TWICE DAILY 11/01/20  Yes Masoud, [name], MD  Accu-Chek Softclix Lancets lancets Check blood sugar two times daily 11/01/20  Yes Masoud, [name], MD  albuterol (VENTOLIN HFA) 108 (90 Base) MCG/ACT inhaler Inhale 2 puffs into the lungs as needed for wheezing or shortness of breath. 11/09/20  Yes Masoud, [name], MD  Alcohol Swabs (B-D SINGLE USE SWABS REGULAR) PADS USE AS DIRECTED 12/27/20  Yes Masoud, [name], MD  aspirin 81 MG EC tablet Take 81 mg by mouth daily. Swallow whole.    Yes [provider]  atorvastatin (LIPITOR) 40 MG tablet TAKE 1 TABLET EVERY DAY 05/05/20  Yes Masoud, [name], MD  B Complex-C (SUPER B COMPLEX PO) Take 1 tablet by  mouth daily.    Yes [provider]  Blood Glucose Monitoring Suppl (ACCU-CHEK AVIVA PLUS) w/Device KIT Check blood sugar BID 11/01/20  Yes Masoud, [name], MD  budesonide-formoterol (SYMBICORT) 160-4.5 MCG/ACT inhaler Inhale 2 puffs into the lungs 2 (two) times daily. 11/01/20  Yes Masoud, [name], MD  cyclobenzaprine (FLEXERIL) 10 MG tablet Take 10 mg by mouth at bedtime as needed for muscle spasms.   Yes [provider]  fluticasone (FLONASE) 50 MCG/ACT nasal spray Place 1 spray into both nostrils 2 (two) times a day.  11/04/18  Yes [provider]  furosemide (LASIX) 20 MG tablet TAKE 2 TABLETS EVERY DAY 12/12/20  Yes Masoud, [name], MD  gabapentin (NEURONTIN) 300 MG capsule Take 1 capsule (300 mg total) by mouth 3 (three) times daily. 09/13/20  Yes Masoud, [name], MD  glipiZIDE (GLUCOTROL XL) 5 MG 24 hr tablet TAKE 1 TABLET TWICE DAILY Patient taking differently: Take 5 mg by mouth 2 (two) times daily before a meal. 01/19/21  Yes Masoud, [name], MD  ipratropium-albuterol (DUONEB) 0.5-2.5 (3) MG/3ML SOLN Take 3 mLs by nebulization every 4 (four) hours as needed. Patient taking differently: Take 3 mLs by nebulization every 4 (four) hours as needed (shortness of  breath or wheezing). 10/26/20  Yes Masoud, [name], MD  losartan-hydrochlorothiazide (HYZAAR) 100-25 MG tablet TAKE 1 TABLET EVERY DAY 05/19/20  Yes Masoud, [name], MD  metFORMIN (GLUCOPHAGE) 500 MG tablet TAKE 1 TABLET TWICE DAILY Patient taking differently: Take 500 mg by mouth 2 (two) times daily with a meal. 04/25/20  Yes Masoud, [name], MD  Omega-3 Fatty Acids (FISH OIL) 1000 MG CAPS Take 2 capsules by mouth 2 (two) times daily.    Yes [provider]  traZODone (DESYREL) 50 MG tablet TAKE 2 TABLETS EVERY DAY Patient taking differently: Take 100 mg by mouth at bedtime. 07/28/20  Yes Masoud, [name], MD  vitamin C (ASCORBIC ACID) 500 MG tablet Take 500 mg by mouth daily.   Yes [provider]  levofloxacin (LEVAQUIN) 500 MG tablet Take 1 tablet (500 mg total) by mouth daily. Patient not taking: No sig reported 09/13/20   [name], MD  levofloxacin (LEVAQUIN) 500 MG tablet Take 1 tablet (500 mg total) by mouth daily. Patient not taking: No sig reported 10/26/20   [name], MD  methylPREDNISolone (MEDROL DOSEPAK) 4 MG TBPK tablet Medrol Dosepak as directed 4 mg Patient not taking: No sig reported 10/26/20   [name], MD    Scheduled Meds: . vitamin C  500 mg Oral Daily  . aspirin EC  81 mg Oral Daily  . atorvastatin  40 mg Oral Daily  . budesonide (PULMICORT) nebulizer solution  0.5 mg Nebulization BID  . Chlorhexidine Gluconate Cloth  6 each Topical Q0600  . enoxaparin (LOVENOX) injection  0.5 mg/kg Subcutaneous Q24H  . fluticasone  1 spray Each Nare BID  . furosemide  40 mg Intravenous Q8H  . gabapentin  300 mg Oral TID  . insulin aspart  0-20 Units Subcutaneous TID WC  . ipratropium-albuterol  3 mL Nebulization Q6H  . losartan  25 mg Oral Daily  . methylPREDNISolone (SOLU-MEDROL) injection  20 mg Intravenous Q12H  . metoprolol succinate  12.5 mg Oral Daily  . mometasone-formoterol  2 puff Inhalation BID  . mupirocin ointment  1 application Nasal BID  . omega-3  acid ethyl esters  2 g Oral BID  . sodium chloride flush  3 mL Intravenous Q12H  . traZODone  100 mg Oral QHS  Continuous Infusions: . sodium chloride    . dexmedetomidine (PRECEDEX) IV infusion 0.6 mcg/kg/hr (01/28/21 0815)   PRN Meds:.sodium chloride, acetaminophen, clonazepam, cyclobenzaprine, guaiFENesin-dextromethorphan, morphine injection, ondansetron (ZOFRAN) IV, sodium chloride flush   Resolved Hospital Problem list     ASSESSMENT & PLAN  Acute Hypoxic Hypercapnic Respiratory Failure secondary to Acute Decompensated HFpEF & COPD with evidence of acute exacerbation -Supplemental O2 as needed to maintain O2 saturations 88 to 92% -BiPAP, wean as tolerated -High risk for intubation -Follow intermittent ABG and chest x-ray as needed -Repeat CXR on 6/3 with Cardiomegaly with mild interstitial pulmonary edema, unchanged. No pleural effusion -IV Lasix as blood pressure and renal function permits; currently on Lasix 40 mg IV BID -Continue IV Steroids -As needed bronchodilators   Acute on chronic Diastolic HFpEF  Hypertension Hx: HLD  -Continuous cardiac monitoring -Maintain MAP greater than 65 -IV Lasix as blood pressure and renal function permits; currently on Lasix 40 mg IV BID -Hold HCTZ, metoprolol. -Continue low dose Losartan -Atorvastatin 40 mg -Repeat 2D Echocardiogram shows Left ventricular ejection fraction, by estimation, is 50 to 55%.  Hypokinesis of septal wall. Left ventricular diastolic parameters are consistent with Grade II diastolic dysfunction (pseudonormalization).The right ventricular size is severely enlarged. -Cardiology following, appreciate input   Acute Metabolic Encephalopathy due to Hypercapnia -Continue Precedex for agitation -Provide supportive care -BiPAP to treat Hypercapnia   Diabetes mellitus -CBGs -Sliding scale insulin -Follow ICU hyper/hypoglycemia protocol -Hold home Glipizide     Best practice (right click and "Reselect all  SmartList Selections" daily)  Diet:  Oral Pain/Anxiety/Delirium protocol (if indicated): Yes (RASS goal 0) VAP protocol (if indicated): Not indicated DVT prophylaxis: LMWH GI prophylaxis: PPI Glucose control:  SSI Yes Central venous access:  N/A Arterial line:  N/A Foley:  Yes, and it is still needed Mobility:  bed rest  PT consulted: Yes Last date of multidisciplinary goals of care discussion [6/3] Code Status:  full code Disposition: ICU  Critical care time: Anna, DNP, FNP-C, AGACNP-BC Acute Care Nurse Practitioner  [place] Pager: [phone] [place] at [facility]

## 2021-01-28 NOTE — Progress Notes (Signed)
Good day. Precedex turned off at 1000. Family in and out. Up in chair x 4 hours without issues. Code status changed to partial code-no CPR. Ate better today as well.

## 2021-01-29 DIAGNOSIS — I5033 Acute on chronic diastolic (congestive) heart failure: Secondary | ICD-10-CM | POA: Diagnosis not present

## 2021-01-29 LAB — CULTURE, RESPIRATORY W GRAM STAIN

## 2021-01-29 LAB — BASIC METABOLIC PANEL
Anion gap: 11 (ref 5–15)
BUN: 46 mg/dL — ABNORMAL HIGH (ref 8–23)
CO2: 36 mmol/L — ABNORMAL HIGH (ref 22–32)
Calcium: 8.6 mg/dL — ABNORMAL LOW (ref 8.9–10.3)
Chloride: 91 mmol/L — ABNORMAL LOW (ref 98–111)
Creatinine, Ser: 0.94 mg/dL (ref 0.61–1.24)
GFR, Estimated: >60 mL/min (ref >60)
Glucose, Bld: 225 mg/dL — ABNORMAL HIGH (ref 70–99)
Potassium: 4.7 mmol/L (ref 3.5–5.1)
Sodium: 138 mmol/L (ref 135–145)

## 2021-01-29 LAB — CBC
HCT: 37 % — ABNORMAL LOW (ref 39.0–52.0)
Hemoglobin: 11.3 g/dL — ABNORMAL LOW (ref 13.0–17.0)
MCH: 30 pg (ref 26.0–34.0)
MCHC: 30.5 g/dL (ref 30.0–36.0)
MCV: 98.1 fL (ref 80.0–100.0)
Platelets: 231 10*3/uL (ref 150–400)
RBC: 3.77 MIL/uL — ABNORMAL LOW (ref 4.22–5.81)
RDW: 14.6 % (ref 11.5–15.5)
WBC: 13.1 10*3/uL — ABNORMAL HIGH (ref 4.0–10.5)
nRBC: 0 % (ref 0.0–0.2)

## 2021-01-29 LAB — GLUCOSE, CAPILLARY
Glucose-Capillary: 276 mg/dL — ABNORMAL HIGH (ref 70–99)
Glucose-Capillary: 206 mg/dL — ABNORMAL HIGH (ref 70–99)
Glucose-Capillary: 227 mg/dL — ABNORMAL HIGH (ref 70–99)
Glucose-Capillary: 295 mg/dL — ABNORMAL HIGH (ref 70–99)

## 2021-01-29 LAB — MAGNESIUM: Magnesium: 2 mg/dL (ref 1.7–2.4)

## 2021-01-29 LAB — PHOSPHORUS: Phosphorus: 3.6 mg/dL (ref 2.5–4.6)

## 2021-01-29 MED ORDER — ENOXAPARIN SODIUM 60 MG/0.6ML IJ SOSY
50.0000 mg | PREFILLED_SYRINGE | INTRAMUSCULAR | Status: DC
  Administered 2021-01-30 – 2021-02-02 (×4): 50 mg via SUBCUTANEOUS
  Filled 2021-01-29 (×4): qty 0.5

## 2021-01-29 MED ORDER — TRAMADOL HCL 50 MG PO TABS
50.0000 mg | ORAL_TABLET | Freq: Once | ORAL | Status: AC
Start: 2021-01-29 — End: 2021-01-29
  Administered 2021-01-29: 50 mg via ORAL
  Filled 2021-01-29: qty 1

## 2021-01-29 NOTE — Progress Notes (Signed)
Placed back on HFNC at pt request

## 2021-01-29 NOTE — Progress Notes (Addendum)
Progress Note  Patient Name: Julian Sparks Date of Encounter: 01/29/2021  Primary Cardiologist: New to [facility] - consult by [place]  Subjective   No chest pain, dyspnea improving. Remains orthopneic and on HFNC. Weight 103-->103.2 kg over the past 24 hours. Documented UOP 2.0 L for the past 24 hours with a net - 4.9 L for the admission. BUN/SCr stable over the past 24 hours. .  Inpatient Medications    Scheduled Meds: . vitamin C  500 mg Oral Daily  . aspirin EC  81 mg Oral Daily  . atorvastatin  40 mg Oral Daily  . budesonide (PULMICORT) nebulizer solution  0.5 mg Nebulization BID  . Chlorhexidine Gluconate Cloth  6 each Topical Q0600  . enoxaparin (LOVENOX) injection  0.5 mg/kg Subcutaneous Q24H  . fluticasone  1 spray Each Nare BID  . furosemide  40 mg Intravenous Q8H  . gabapentin  300 mg Oral TID  . insulin aspart  0-20 Units Subcutaneous TID AC & HS  . ipratropium-albuterol  3 mL Nebulization Q6H  . losartan  25 mg Oral Daily  . methylPREDNISolone (SOLU-MEDROL) injection  20 mg Intravenous Q12H  . metoprolol succinate  12.5 mg Oral Daily  . mometasone-formoterol  2 puff Inhalation BID  . mupirocin ointment  1 application Nasal BID  . omega-3 acid ethyl esters  2 g Oral BID  . sodium chloride flush  3 mL Intravenous Q12H  . traZODone  100 mg Oral QHS   Continuous Infusions: . sodium chloride    . dexmedetomidine (PRECEDEX) IV infusion Stopped (01/28/21 0923)   PRN Meds: sodium chloride, acetaminophen, clonazepam, cyclobenzaprine, guaiFENesin-dextromethorphan, morphine injection, ondansetron (ZOFRAN) IV, sodium chloride flush   Vital Signs    Vitals:   01/29/21 0400 01/29/21 0412 01/29/21 0500 01/29/21 0600  BP: 128/73  (!) 143/73 (!) 142/62  Pulse: 93  98 94  Resp: (!) 22  (!) 26 (!) 24  Temp: 97.8 F (36.6 C)     TempSrc: Oral     SpO2: 92% 91% 92% 97%  Weight:        Intake/Output Summary (Last 24 hours) at 01/29/2021 0732 Last data filed at 01/29/2021  0500 Gross per 24 hour  Intake 2069.36 ml  Output 4125 ml  Net -2055.64 ml   Filed Weights   01/27/21 0800 01/28/21 0500 01/29/21 0350  Weight: 104.4 kg 103 kg 103.2 kg    Telemetry    SR with PVCs - Personally Reviewed  ECG    No new tracings - Personally Reviewed  Physical Exam   GEN: No acute distress.   Neck: JVD elevated ~ 8 cm. Cardiac: RRR, no murmurs, rubs, or gallops.  Respiratory: Continued diminished breath sounds bilaterally with expiratory wheezing. On HFNC. GI: Soft, nontender, non-distended.   MS: Trace bilateral pretibial lower extremity edema with legs wrapped; No deformity. Neuro:  Alert and oriented x 3; Nonfocal.  Psych: Normal affect.  Labs    Chemistry Recent Labs  Lab 01/24/21 1405 01/25/21 0442 01/27/21 0524 01/28/21 0439 01/29/21 0446  NA  --    < > 139 143 138  K  --    < > 4.0 5.7*  5.6* 4.7  CL  --    < > 95* 96* 91*  CO2  --    < > 34* 36* 36*  GLUCOSE  --    < > 119* 185* 225*  BUN  --    < > 31* 40* 46*  CREATININE  --    < >  1.10 1.15 0.94  CALCIUM  --    < > 8.2* 8.6* 8.6*  PROT 6.9  --   --   --   --   ALBUMIN 3.6  --   --   --   --   AST 20  --   --   --   --   ALT 18  --   --   --   --   ALKPHOS 67  --   --   --   --   BILITOT 1.1  --   --   --   --   GFRNONAA  --    < > >60 >60 >60  ANIONGAP  --    < > 10 11 11    < > = values in this interval not displayed.     Hematology Recent Labs  Lab 01/27/21 0524 01/28/21 0439 01/29/21 0446  WBC 9.6 6.8 13.1*  RBC 3.55* 4.13* 3.77*  HGB 10.8* 12.1* 11.3*  HCT 35.2* 41.0 37.0*  MCV 99.2 99.3 98.1  MCH 30.4 29.3 30.0  MCHC 30.7 29.5* 30.5  RDW 15.1 14.6 14.6  PLT 227 221 231    Cardiac EnzymesNo results for input(s): TROPONINI in the last 168 hours. No results for input(s): TROPIPOC in the last 168 hours.   BNP Recent Labs  Lab 01/25/21 0442  BNP 278.7*     DDimer No results for input(s): DDIMER in the last 168 hours.   Radiology    DG Chest 2  View  Result Date: 01/24/2021 IMPRESSION: Cardiomegaly bilateral interstitial prominence most consistent interstitial edema. Findings consistent with CHF. Electronically Signed   By: Marcello Moores  Register   On: 01/24/2021 11:30   US Venous Img Lower Bilateral (DVT)  Result Date: 01/26/2021 IMPRESSION: Negative. Electronically Signed   By: Donavan Foil M.D.   On: 01/26/2021 01:34    Cardiac Studies   2D echo 01/24/2021: Pending __________  2D echo 01/2019: 1. The left ventricle has normal systolic function, with an ejection  fraction of 55-60%. The cavity size was normal. There is mild concentric  left ventricular hypertrophy. Left ventricular diastolic function could  not be evaluated.  2. The right ventricle has normal systolic function. The cavity was  normal. There is no increase in right ventricular wall thickness.  3. Trivial pericardial effusion is present.  4. The mitral valve is grossly normal.  5. The tricuspid valve is grossly normal.  6. The aortic valve is grossly normal.  7. The inferior vena cava was dilated in size with >50% respiratory  variability.  8. very suboptimal study.   Patient Profile     75 y.o. male with history of chronic hypoxic and hypercapnic respiratory failure on supplemental oxygen, COPD, DM2, HTN, venous insufficiency, and obesity admitted on 5/31 with acute on chronic hypoxic and hypercapnic respiratory failure secondary to acute on chronic diastolic CHF, AECOPD, and hyperkalemia.   Assessment & Plan    1. Acute on chronic hypoxic and hypercapnic respiratory failure: -Multifactorial including acute on chronic diastolic CHF and AECOPD -HFNC, wean at tolerated -Less confused/agitated, Precedex stopped -Will need ischemic workup once his respiratory status is improved as outline below  2. Right-sided volume overload: -He has had good UOP and volume status is significantly improved -Continue IV Lasix 40 mg q 8 hours -No need for milrinone  at this time -Echo has been read, though is not crossing over to Epic, a ticket has been placed. It showed normal LVSF, moderately dilated  RV and RA with a significantly dilated IVC. PA pressure could not be estimated. Diastolic function appear normal -He will benefit from a RHC at a minimum, and ideally a R/LHC if renal function allows, possibly early next week once his respiratory status and orthopnea are improved -Close monitoring of renal function -Daily weights -Strict I/O -Overall prognosis appears to be quite poor, remains a full code  3. HTN: -Blood pressure soft yesterday, improved today -Metoprolol and losartan   4. HLD: -Liptior  5. AECOPD: -Management per IM -IV steroids, wean when possible to minimize fluids rentention    For questions or updates, please contact [place] Please consult [email or web address] for contact info under Cardiology/STEMI.    Signed, Christell Faith, PA-C [place] Pager: [phone] 01/29/2021, 7:32 AM  Cardiology attending  Patient seen and examined. Agree with the findings as noted above. The patient remains critically ill, but appears improved. His dyspnea is better and he appears less anxious. His exam is notable for minimal edema. He has minimal rales in the bases. His renal function is stable. Continue diuresis.  Accurate daily weights. bp is better. Can re-initiate the beta blocker and losartan.   Carleene Overlie Julian [place],MD

## 2021-01-29 NOTE — Progress Notes (Signed)
Another good day. Up in chair x 6 hours per request. Laughing and joking. Family pleased. HFNC oxygen weaned slowly to 70%/35L.

## 2021-01-29 NOTE — Progress Notes (Addendum)
NAME:  Julian Sparks, MRN:  [id number], DOB:  Apr 16, 1946, LOS: 5 ADMISSION DATE:  01/24/2021, INITIAL CONSULTATION DATE: 01/27/2021 REFERRING MD: Max Sane MD, CHIEF COMPLAINT: Worsening SOB  Brief Patient Description  75 y.o. male with PMH as below presenting to the ED worsening shortness of breath from his baseline associated with bilateral lower extremity swelling as well as increased abdominal girth for about 2 days. Has generalized anasarca +hypoxia + chronic venous stasis with ulceration and was seen at the vascular clinic where he had on arrival placed bilaterally to both lower extremities. He was admitted to PCU with Severe ACUTE Hypoxic and Hypercapnic Respiratory Failure due to acute severe decompensated combined systloic and diastolic heart failure on BiPAP  Pertinent  Medical History    Chronic bronchitis ([place])  . COPD (chronic obstructive pulmonary disease) ([place])  . Diabetes mellitus without complication ([place])  . Diastolic dysfunction  . Dyspnea   Chronic venous statis  . Hypertension   Significant Hospital Events: Including procedures, antibiotic start and stop dates in addition to other pertinent events   5/31: Admitted for acute diastolic heart failure 6/1: Resp distress and mental status changes, placed on biPAP 6/2: Respiratory status improved, weaned off BiPAP to HFNC 50% FiO2 @ 60L 6/3: Overnight with agitation and unable to tolerate BiPAP with thick tan secretions, resulting in  hypoxia (O2 sats 60's) during coughing episode, transferred to [facility] ~ now weaned back to HFNC, add IV steroids for AECOPD 6/5: Tolerated BiPAP for 3 hours last night   Cultures:  01/24/2021: SARS-CoV-2 PCR>> negative 01/24/2021: Influenza PCR>> negative 01/27/2021: MRSA PCR>> negative   Antimicrobials:  None required Interim History / Subjective:  -Sitting up in chair -tolerated  BiPAP overnight x 3 hours, on HHFNC this morning -Remains on low dose Precedex for agitation   FiO2 (%):   [72 %-85 %] 80 %  OBJECTIVE   Blood pressure (!) 138/59, pulse 100, temperature 97.8 F (36.6 C), temperature source Oral, resp. rate (!) 24, weight 103.2 kg, SpO2 97 %.    FiO2 (%):  [72 %-85 %] 80 %   Intake/Output Summary (Last 24 hours) at 01/29/2021 0955 Last data filed at 01/29/2021 0920 Gross per 24 hour  Intake 1697.65 ml  Output 4875 ml  Net -3177.35 ml   Filed Weights   01/27/21 0800 01/28/21 0500 01/29/21 0350  Weight: 104.4 kg 103 kg 103.2 kg    Physical Examination: GENERAL: Acute on chronically ill-appearing male, sitting up in chair , on HHFNC, no acute distress HEAD: Normocephalic, atraumatic.  EYES: Pupils equal, round, reactive to light.  No scleral icterus.  MOUTH: Moist mucosal membrane. NECK: Supple, positive JVD PULMONARY: Coarse breath sounds bilaterally when expiratory wheezing, mild tachypnea, even CARDIOVASCULAR: S1 and S2. Regular rate and rhythm. No murmurs, rubs, or gallops.  GASTROINTESTINAL: Soft, nontender, -distended. Positive bowel sounds.  MUSCULOSKELETAL:1+edema bilateral lower extremity edema with legs wrapped, no deformities.  NEUROLOGIC: Awake, alert and oriented x3, moves all extremities to command, no focal deficits, speech clear, pupils PERRLA SKIN: Limited exam- intact,warm,dry  Labs/imaging that I havepersonally reviewed  (right click and "Reselect all SmartList Selections" daily)  Labs 01/26/2021: Chloride 95, bicarb 34, glucose 119, BUN 31, creatinine 1.1, WBC 9.6, hemoglobin 10.8 Chest x-ray 01/27/2021:Cardiomegaly with mild interstitial pulmonary edema, unchanged. No pleural effusion. Venous ultrasound bilateral lower extremities 6/2>> negative  Labs   CBC: Recent Labs  Lab 01/24/21 1132 01/26/21 0540 01/27/21 0524 01/28/21 0439 01/29/21 0446  WBC 10.2 9.1 9.6 6.8 13.1*  HGB 11.8* 10.7* 10.8* 12.1* 11.3*  HCT 38.4* 35.6* 35.2* 41.0 37.0*  MCV 99.0 99.2 99.2 99.3 98.1  PLT 233 236 227 221 119    Basic Metabolic  Panel: Recent Labs  Lab 01/25/21 0442 01/26/21 0540 01/27/21 0524 01/28/21 0439 01/29/21 0446  NA 138 139 139 143 138  K 3.9 4.0 4.0 5.7*  5.6* 4.7  CL 97* 95* 95* 96* 91*  CO2 32 34* 34* 36* 36*  GLUCOSE 99 71 119* 185* 225*  BUN 21 25* 31* 40* 46*  CREATININE 0.94 1.08 1.10 1.15 0.94  CALCIUM 8.4* 8.2* 8.2* 8.6* 8.6*  MG  --   --   --  2.2 2.0  PHOS  --   --   --  5.1* 3.6   GFR: Estimated Creatinine Clearance: 75.3 mL/min (by C-G formula based on SCr of 0.94 mg/dL). Recent Labs  Lab 01/26/21 0540 01/27/21 0524 01/28/21 0439 01/29/21 0446  WBC 9.1 9.6 6.8 13.1*    Liver Function Tests: Recent Labs  Lab 01/24/21 1405  AST 20  ALT 18  ALKPHOS 67  BILITOT 1.1  PROT 6.9  ALBUMIN 3.6   No results for input(s): LIPASE, AMYLASE in the last 168 hours. No results for input(s): AMMONIA in the last 168 hours.  ABG    Component Value Date/Time   PHART 7.34 (L) 01/27/2021 0115   PCO2ART 77 (HH) 01/27/2021 0115   PO2ART 72 (L) 01/27/2021 0115   HCO3 41.5 (H) 01/27/2021 0115   O2SAT 93.3 01/27/2021 0115     Coagulation Profile: No results for input(s): INR, PROTIME in the last 168 hours.  Cardiac Enzymes: No results for input(s): CKTOTAL, CKMB, CKMBINDEX, TROPONINI in the last 168 hours.  HbA1C: Hgb A1c MFr Bld  Date/Time Value Ref Range Status  01/24/2021 11:32 AM 6.8 (H) 4.8 - 5.6 % Final    Comment:    (NOTE)         Prediabetes: 5.7 - 6.4         Diabetes: >6.4         Glycemic control for adults with diabetes: <7.0   02/05/2019 02:07 AM 7.3 (H) 4.8 - 5.6 % Final    Comment:    (NOTE) Pre diabetes:          5.7%-6.4% Diabetes:              >6.4% Glycemic control for   <7.0% adults with diabetes     CBG: Recent Labs  Lab 01/28/21 0729 01/28/21 1106 01/28/21 1630 01/28/21 2124 01/29/21 0706  GLUCAP 185* 212* 207* 129* 276*    Allergies Allergies  Allergen Reactions  . Aspirin Nausea Only and Other (See Comments)    325 mg aspirin  cause stomach upset  . Penicillins Rash     Home Medications  Prior to Admission medications   Medication Sig Start Date End Date Taking? Authorizing Provider  ACCU-CHEK AVIVA PLUS test strip TEST BLOOD SUGAR TWICE DAILY 11/01/20  Yes Masoud, [name], MD  Accu-Chek Softclix Lancets lancets Check blood sugar two times daily 11/01/20  Yes Masoud, [name], MD  albuterol (VENTOLIN HFA) 108 (90 Base) MCG/ACT inhaler Inhale 2 puffs into the lungs as needed for wheezing or shortness of breath. 11/09/20  Yes Masoud, [name], MD  Alcohol Swabs (B-D SINGLE USE SWABS REGULAR) PADS USE AS DIRECTED 12/27/20  Yes Masoud, [name], MD  aspirin 81 MG EC tablet Take 81 mg by mouth daily. Swallow whole.    Yes [provider]  atorvastatin (LIPITOR) 40 MG tablet TAKE 1 TABLET EVERY DAY 05/05/20  Yes Masoud, [name], MD  B Complex-C (SUPER B COMPLEX PO) Take 1 tablet by mouth daily.    Yes [provider]  Blood Glucose Monitoring Suppl (ACCU-CHEK AVIVA PLUS) w/Device KIT Check blood sugar BID 11/01/20  Yes Masoud, [name], MD  budesonide-formoterol (SYMBICORT) 160-4.5 MCG/ACT inhaler Inhale 2 puffs into the lungs 2 (two) times daily. 11/01/20  Yes Masoud, [name], MD  cyclobenzaprine (FLEXERIL) 10 MG tablet Take 10 mg by mouth at bedtime as needed for muscle spasms.   Yes [provider]  fluticasone (FLONASE) 50 MCG/ACT nasal spray Place 1 spray into both nostrils 2 (two) times a day.  11/04/18  Yes [provider]  furosemide (LASIX) 20 MG tablet TAKE 2 TABLETS EVERY DAY 12/12/20  Yes Masoud, [name], MD  gabapentin (NEURONTIN) 300 MG capsule Take 1 capsule (300 mg total) by mouth 3 (three) times daily. 09/13/20  Yes Masoud, [name], MD  glipiZIDE (GLUCOTROL XL) 5 MG 24 hr tablet TAKE 1 TABLET TWICE DAILY Patient taking differently: Take 5 mg by mouth 2 (two) times daily before a meal. 01/19/21  Yes Masoud, [name], MD  ipratropium-albuterol (DUONEB) 0.5-2.5 (3) MG/3ML SOLN Take 3 mLs by nebulization every 4  (four) hours as needed. Patient taking differently: Take 3 mLs by nebulization every 4 (four) hours as needed (shortness of breath or wheezing). 10/26/20  Yes Masoud, [name], MD  losartan-hydrochlorothiazide (HYZAAR) 100-25 MG tablet TAKE 1 TABLET EVERY DAY 05/19/20  Yes Masoud, [name], MD  metFORMIN (GLUCOPHAGE) 500 MG tablet TAKE 1 TABLET TWICE DAILY Patient taking differently: Take 500 mg by mouth 2 (two) times daily with a meal. 04/25/20  Yes Masoud, [name], MD  Omega-3 Fatty Acids (FISH OIL) 1000 MG CAPS Take 2 capsules by mouth 2 (two) times daily.    Yes [provider]  traZODone (DESYREL) 50 MG tablet TAKE 2 TABLETS EVERY DAY Patient taking differently: Take 100 mg by mouth at bedtime. 07/28/20  Yes Masoud, [name], MD  vitamin C (ASCORBIC ACID) 500 MG tablet Take 500 mg by mouth daily.   Yes [provider]  levofloxacin (LEVAQUIN) 500 MG tablet Take 1 tablet (500 mg total) by mouth daily. Patient not taking: No sig reported 09/13/20   [name], MD  levofloxacin (LEVAQUIN) 500 MG tablet Take 1 tablet (500 mg total) by mouth daily. Patient not taking: No sig reported 10/26/20   [name], MD  methylPREDNISolone (MEDROL DOSEPAK) 4 MG TBPK tablet Medrol Dosepak as directed 4 mg Patient not taking: No sig reported 10/26/20   [name], MD    Scheduled Meds: . vitamin C  500 mg Oral Daily  . aspirin EC  81 mg Oral Daily  . atorvastatin  40 mg Oral Daily  . budesonide (PULMICORT) nebulizer solution  0.5 mg Nebulization BID  . Chlorhexidine Gluconate Cloth  6 each Topical Q0600  . enoxaparin (LOVENOX) injection  0.5 mg/kg Subcutaneous Q24H  . fluticasone  1 spray Each Nare BID  . furosemide  40 mg Intravenous Q8H  . gabapentin  300 mg Oral TID  . insulin aspart  0-20 Units Subcutaneous TID AC & HS  . ipratropium-albuterol  3 mL Nebulization Q6H  . losartan  25 mg Oral Daily  . methylPREDNISolone (SOLU-MEDROL) injection  20 mg Intravenous Q12H  . metoprolol succinate   12.5 mg Oral Daily  . mometasone-formoterol  2 puff Inhalation BID  . mupirocin ointment  1 application Nasal BID  .  omega-3 acid ethyl esters  2 g Oral BID  . sodium chloride flush  3 mL Intravenous Q12H  . traZODone  100 mg Oral QHS   Continuous Infusions: . sodium chloride    . dexmedetomidine (PRECEDEX) IV infusion Stopped (01/28/21 0923)   PRN Meds:.sodium chloride, acetaminophen, clonazepam, cyclobenzaprine, guaiFENesin-dextromethorphan, morphine injection, ondansetron (ZOFRAN) IV, sodium chloride flush   Resolved Hospital Problem list     ASSESSMENT & PLAN  Acute Hypoxic Hypercapnic Respiratory Failure secondary to Acute Decompensated HFpEF & COPD with evidence of acute exacerbation -Supplemental O2 as needed to maintain O2 saturations 88 to 92% -BiPAP, wean as tolerated -High risk for intubation -Follow intermittent ABG and chest x-ray as needed -Repeat CXR on 6/3 with Cardiomegaly with mild interstitial pulmonary edema, unchanged. No pleural effusion -IV Lasix as blood pressure and renal function permits; currently on Lasix 40 mg IV BID -Continue IV Steroids -As needed bronchodilators   Acute on chronic Diastolic HFpEF  Hypertension Hx: HLD  -Continuous cardiac monitoring -Maintain MAP greater than 65 -IV Lasix as blood pressure and renal function permits; currently on Lasix 40 mg IV BID -Hold HCTZ, metoprolol. -Continue low dose Losartan -Atorvastatin 40 mg -Repeat 2D Echocardiogram shows Left ventricular ejection fraction, by estimation, is 50 to 55%.  Hypokinesis of septal wall. Left ventricular diastolic parameters are consistent with Grade II diastolic dysfunction (pseudonormalization).The right ventricular size is severely enlarged. -Cardiology following, appreciate input   Acute Metabolic Encephalopathy due to Hypercapnia now improved -Off precedex -Provide supportive care -BiPAP to treat Hypercapnia   Diabetes mellitus -CBGs -Sliding scale  insulin -Follow ICU hyper/hypoglycemia protocol -Hold home Glipizide     Best practice (right click and "Reselect all SmartList Selections" daily)  Diet:  Oral Pain/Anxiety/Delirium protocol (if indicated): Yes (RASS goal 0) VAP protocol (if indicated): Not indicated DVT prophylaxis: LMWH GI prophylaxis: PPI Glucose control:  SSI Yes Central venous access:  N/A Arterial line:  N/A Foley:  Yes, and it is still needed Mobility:  bed rest  PT consulted: Yes Last date of multidisciplinary goals of care discussion [6/3] Code Status:  full code Disposition: ICU  Critical care time: [place], DNP, FNP-C, AGACNP-BC Acute Care Nurse Practitioner  [place] Pager: [phone] [place] [facility] [place]:  Patient seen and examined and relevant ancillary tests reviewed.   I agree with the assessment and plan of care as outlined by Rufina Falco NP.  This patient was not seen as a shared visit. The following reflects my independent critical care time.  I  personally  reviewed database in its entirety and discussed care plan in detail. In addition, this patient was discussed on multidisciplinary rounds.   I agree with assessment and plan. Acute Hypoxic Hypercapnic Respiratory Failure secondary to Acute Decompensated HFpEF&COPD with evidence ofacute exacerbation Severe hypoxia biPAP and high flow [place] High risk for intubation   Critical Care Time devoted to patient care services described in this note is 55 total care time minutes.   Overall, patient is critically ill, prognosis is guarded.  Patient with Multiorgan failure and at high risk for cardiac arrest and death.    Corrin Parker, M.D.  [facility]  Medical Director [place] Director [facility] Cardio-Pulmonary Department

## 2021-01-30 LAB — GLUCOSE, CAPILLARY
Glucose-Capillary: 193 mg/dL — ABNORMAL HIGH (ref 70–99)
Glucose-Capillary: 234 mg/dL — ABNORMAL HIGH (ref 70–99)
Glucose-Capillary: 294 mg/dL — ABNORMAL HIGH (ref 70–99)
Glucose-Capillary: 265 mg/dL — ABNORMAL HIGH (ref 70–99)

## 2021-01-30 LAB — CBC
HCT: 37.2 % — ABNORMAL LOW (ref 39.0–52.0)
Hemoglobin: 11.3 g/dL — ABNORMAL LOW (ref 13.0–17.0)
MCH: 30.1 pg (ref 26.0–34.0)
MCHC: 30.4 g/dL (ref 30.0–36.0)
MCV: 99.2 fL (ref 80.0–100.0)
Platelets: 224 10*3/uL (ref 150–400)
RBC: 3.75 MIL/uL — ABNORMAL LOW (ref 4.22–5.81)
RDW: 14.8 % (ref 11.5–15.5)
WBC: 11.2 10*3/uL — ABNORMAL HIGH (ref 4.0–10.5)
nRBC: 0 % (ref 0.0–0.2)

## 2021-01-30 LAB — BASIC METABOLIC PANEL
Anion gap: 11 (ref 5–15)
BUN: 41 mg/dL — ABNORMAL HIGH (ref 8–23)
CO2: 41 mmol/L — ABNORMAL HIGH (ref 22–32)
Calcium: 9 mg/dL (ref 8.9–10.3)
Chloride: 88 mmol/L — ABNORMAL LOW (ref 98–111)
Creatinine, Ser: 0.89 mg/dL (ref 0.61–1.24)
GFR, Estimated: >60 mL/min (ref >60)
Glucose, Bld: 209 mg/dL — ABNORMAL HIGH (ref 70–99)
Potassium: 4.9 mmol/L (ref 3.5–5.1)
Sodium: 140 mmol/L (ref 135–145)

## 2021-01-30 LAB — MAGNESIUM: Magnesium: 2.3 mg/dL (ref 1.7–2.4)

## 2021-01-30 LAB — PHOSPHORUS: Phosphorus: 3.4 mg/dL (ref 2.5–4.6)

## 2021-01-30 MED ORDER — INSULIN GLARGINE 100 UNIT/ML ~~LOC~~ SOLN
10.0000 [IU] | Freq: Every day | SUBCUTANEOUS | Status: DC
  Administered 2021-01-30 – 2021-02-04 (×6): 10 [IU] via SUBCUTANEOUS
  Filled 2021-01-30 (×7): qty 0.1

## 2021-01-30 MED ORDER — SODIUM CHLORIDE 0.9 % IV SOLN
1.0000 g | INTRAVENOUS | Status: DC
  Filled 2021-01-30: qty 10

## 2021-01-30 MED ORDER — METOLAZONE 2.5 MG PO TABS
2.5000 mg | ORAL_TABLET | Freq: Every day | ORAL | Status: DC
  Administered 2021-01-30 – 2021-02-02 (×4): 2.5 mg via ORAL
  Filled 2021-01-30 (×5): qty 1

## 2021-01-30 MED ORDER — SODIUM CHLORIDE 0.9 % IV SOLN
2.0000 g | INTRAVENOUS | Status: DC
  Administered 2021-01-30 – 2021-02-01 (×3): 2 g via INTRAVENOUS
  Filled 2021-01-30 (×4): qty 20

## 2021-01-30 MED ORDER — ACETAZOLAMIDE 250 MG PO TABS
500.0000 mg | ORAL_TABLET | Freq: Two times a day (BID) | ORAL | Status: DC
  Administered 2021-01-30 (×2): 500 mg via ORAL
  Filled 2021-01-30 (×3): qty 2

## 2021-01-30 MED ORDER — METHYLPREDNISOLONE SODIUM SUCC 40 MG IJ SOLR
20.0000 mg | Freq: Every day | INTRAMUSCULAR | Status: DC
  Administered 2021-01-30 – 2021-02-03 (×5): 20 mg via INTRAVENOUS
  Filled 2021-01-30 (×6): qty 1

## 2021-01-30 NOTE — Progress Notes (Signed)
Pt ox4. Calm and cooperative, pleasant.   Foley in place. @ outset of shift, urine clear/yellow. Near 1400, urine transitioned to amber/clear. Near 1700, urine transitioned to pink/red, intensivists notified. Will continue to monitor.  Pt got diamox and metolazone today. Pt gets Lovenox q24 hrs. 3.9L out via foley today.  Pt remains on [place]. Pt weaned by RT to 35L 65%.   Large BM on bedside comode, soft brown.   Pt up to chair today, eating well.   Family @ bedside.

## 2021-01-30 NOTE — Progress Notes (Signed)
NAME:  Julian Sparks, MRN:  [id number], DOB:  August 05, 1946, LOS: 6 ADMISSION DATE:  01/24/2021, INITIAL CONSULTATION DATE: 01/27/2021 REFERRING MD: Max Sane MD, CHIEF COMPLAINT: Worsening SOB  Brief Patient Description  75 y.o. male with PMH as below presenting to the ED worsening shortness of breath from his baseline associated with bilateral lower extremity swelling as well as increased abdominal girth for about 2 days. Has generalized anasarca +hypoxia + chronic venous stasis with ulceration and was seen at the vascular clinic where he had on arrival placed bilaterally to both lower extremities. He was admitted to PCU with Severe ACUTE Hypoxic and Hypercapnic Respiratory Failure due to acute severe decompensated combined systloic and diastolic heart failure on BiPAP  Pertinent  Medical History    Chronic bronchitis ([place])  . COPD (chronic obstructive pulmonary disease) ([place])  . Diabetes mellitus without complication ([place])  . Diastolic dysfunction  . Dyspnea   Chronic venous statis  . Hypertension   Significant Hospital Events: Including procedures, antibiotic start and stop dates in addition to other pertinent events   5/31: Admitted for acute diastolic heart failure 6/1: Resp distress and mental status changes, placed on biPAP 6/2: Respiratory status improved, weaned off BiPAP to HFNC 50% FiO2 @ 60L 6/3: Overnight with agitation and unable to tolerate BiPAP with thick tan secretions, resulting in  hypoxia (O2 sats 60's) during coughing episode, transferred to [facility] ~ now weaned back to HFNC, add IV steroids for AECOPD 6/5: Tolerated BiPAP for 3 hours last night 6/6: Remains on HHFNC (currently at 87% FiO2),BMP concerning for Contraction Metabolic Alkalosis due to aggressive Diuresis with Lasix ~ will hold Lasix and give Diamox + Metolazone; Sputum culture with Streptococcus Agalactiae ~ will start Ceftriaxone    Cultures:  01/24/2021: SARS-CoV-2 PCR>> negative 01/24/2021: Influenza  PCR>> negative 01/27/2021: MRSA PCR>> negative 01/27/2021: Sputum>> STREPTOCOCCUS AGALACTIAE  Antimicrobials:  Ceftriaxone 6/6>>  Interim History / Subjective:  -Sitting in bed, on HHFNC, eating breakfast -No acute events overnight, remains off Precedex -Afebrile, hemodynamically stable, no vasopressors -Pt reports mild SOB, cough productive of tan/white sputum -Denies wheezing, chest pain, palpitation, abdominal pain, N/V/D -Urine output 4.75L over last 24 hrs (net - 9.8 L since admit); Creatine remains stable at 0.89 (0.94 yesterday) -BMP concerning for Contraction Metabolic Alkalosis due to aggressive Diuresis with Lasix ~ discussed with Dr. Mortimer Fries, will hold Lasix and give Diamox + Metolazone  -Sputum culture with Streptococcus Agalactiae ~ will add Ceftriaxone   FiO2 (%):  [50 %-87 %] 87 %  OBJECTIVE   Blood pressure (!) 143/76, pulse 91, temperature 98 F (36.7 C), temperature source Oral, resp. rate (!) 21, weight 103.2 kg, SpO2 97 %.    FiO2 (%):  [50 %-87 %] 87 %   Intake/Output Summary (Last 24 hours) at 01/30/2021 0857 Last data filed at 01/30/2021 0800 Gross per 24 hour  Intake 786 ml  Output 4800 ml  Net -4014 ml   Filed Weights   01/27/21 0800 01/28/21 0500 01/29/21 0350  Weight: 104.4 kg 103 kg 103.2 kg    Physical Examination: GENERAL: Acute on chronically ill-appearing male, sitting up in bed , on HHFNC, no acute distress HEAD: Normocephalic, atraumatic.  EYES: Pupils equal, round, reactive to light.  No scleral icterus.  MOUTH: Moist mucosal membrane. NECK: Supple, positive JVD PULMONARY: Coarse breath sounds bilaterally when mild expiratory wheezing, even CARDIOVASCULAR: S1 and S2. Regular rate and rhythm. No murmurs, rubs, or gallops.  GASTROINTESTINAL: Soft, nontender, -distended. Positive bowel sounds.  MUSCULOSKELETAL:1+edema  bilateral lower extremity edema with legs wrapped, no deformities.  NEUROLOGIC: Awake, alert and oriented x3, moves all extremities  to command, no focal deficits, speech clear, pupils PERRLA SKIN: Limited exam- intact,warm,dry  Labs/imaging that I havepersonally reviewed  (right click and "Reselect all SmartList Selections" daily)  Labs 01/30/2021: Cl 88, Bicarb 41, glucose 209, BUN 41, Cr. 0.89, WBC 11.2, Hgb 11.3, Hct 37.2 Chest x-ray 01/27/2021:Cardiomegaly with mild interstitial pulmonary edema, unchanged. No pleural effusion. Venous ultrasound bilateral lower extremities 6/2>> negative  Labs   CBC: Recent Labs  Lab 01/26/21 0540 01/27/21 0524 01/28/21 0439 01/29/21 0446 01/30/21 0651  WBC 9.1 9.6 6.8 13.1* 11.2*  HGB 10.7* 10.8* 12.1* 11.3* 11.3*  HCT 35.6* 35.2* 41.0 37.0* 37.2*  MCV 99.2 99.2 99.3 98.1 99.2  PLT 236 227 221 231 403    Basic Metabolic Panel: Recent Labs  Lab 01/26/21 0540 01/27/21 0524 01/28/21 0439 01/29/21 0446 01/30/21 0651  NA 139 139 143 138 140  K 4.0 4.0 5.7*  5.6* 4.7 4.9  CL 95* 95* 96* 91* 88*  CO2 34* 34* 36* 36* 41*  GLUCOSE 71 119* 185* 225* 209*  BUN 25* 31* 40* 46* 41*  CREATININE 1.08 1.10 1.15 0.94 0.89  CALCIUM 8.2* 8.2* 8.6* 8.6* 9.0  MG  --   --  2.2 2.0 2.3  PHOS  --   --  5.1* 3.6 3.4   GFR: Estimated Creatinine Clearance: 79.5 mL/min (by C-G formula based on SCr of 0.89 mg/dL). Recent Labs  Lab 01/27/21 0524 01/28/21 0439 01/29/21 0446 01/30/21 0651  WBC 9.6 6.8 13.1* 11.2*    Liver Function Tests: Recent Labs  Lab 01/24/21 1405  AST 20  ALT 18  ALKPHOS 67  BILITOT 1.1  PROT 6.9  ALBUMIN 3.6   No results for input(s): LIPASE, AMYLASE in the last 168 hours. No results for input(s): AMMONIA in the last 168 hours.  ABG    Component Value Date/Time   PHART 7.34 (L) 01/27/2021 0115   PCO2ART 77 (HH) 01/27/2021 0115   PO2ART 72 (L) 01/27/2021 0115   HCO3 41.5 (H) 01/27/2021 0115   O2SAT 93.3 01/27/2021 0115     Coagulation Profile: No results for input(s): INR, PROTIME in the last 168 hours.  Cardiac Enzymes: No results for  input(s): CKTOTAL, CKMB, CKMBINDEX, TROPONINI in the last 168 hours.  HbA1C: Hgb A1c MFr Bld  Date/Time Value Ref Range Status  01/24/2021 11:32 AM 6.8 (H) 4.8 - 5.6 % Final    Comment:    (NOTE)         Prediabetes: 5.7 - 6.4         Diabetes: >6.4         Glycemic control for adults with diabetes: <7.0   02/05/2019 02:07 AM 7.3 (H) 4.8 - 5.6 % Final    Comment:    (NOTE) Pre diabetes:          5.7%-6.4% Diabetes:              >6.4% Glycemic control for   <7.0% adults with diabetes     CBG: Recent Labs  Lab 01/29/21 0706 01/29/21 1135 01/29/21 1543 01/29/21 1940 01/30/21 0807  GLUCAP 276* 206* 227* 295* 193*    Allergies Allergies  Allergen Reactions  . Aspirin Nausea Only and Other (See Comments)    325 mg aspirin cause stomach upset  . Penicillins Rash     Home Medications  Prior to Admission medications   Medication Sig Start  Date End Date Taking? Authorizing Provider  ACCU-CHEK AVIVA PLUS test strip TEST BLOOD SUGAR TWICE DAILY 11/01/20  Yes Masoud, [name], MD  Accu-Chek Softclix Lancets lancets Check blood sugar two times daily 11/01/20  Yes Masoud, [name], MD  albuterol (VENTOLIN HFA) 108 (90 Base) MCG/ACT inhaler Inhale 2 puffs into the lungs as needed for wheezing or shortness of breath. 11/09/20  Yes Masoud, [name], MD  Alcohol Swabs (B-D SINGLE USE SWABS REGULAR) PADS USE AS DIRECTED 12/27/20  Yes Masoud, [name], MD  aspirin 81 MG EC tablet Take 81 mg by mouth daily. Swallow whole.    Yes [provider]  atorvastatin (LIPITOR) 40 MG tablet TAKE 1 TABLET EVERY DAY 05/05/20  Yes Masoud, [name], MD  B Complex-C (SUPER B COMPLEX PO) Take 1 tablet by mouth daily.    Yes [provider]  Blood Glucose Monitoring Suppl (ACCU-CHEK AVIVA PLUS) w/Device KIT Check blood sugar BID 11/01/20  Yes Masoud, [name], MD  budesonide-formoterol (SYMBICORT) 160-4.5 MCG/ACT inhaler Inhale 2 puffs into the lungs 2 (two) times daily. 11/01/20  Yes Masoud, [name], MD   cyclobenzaprine (FLEXERIL) 10 MG tablet Take 10 mg by mouth at bedtime as needed for muscle spasms.   Yes [provider]  fluticasone (FLONASE) 50 MCG/ACT nasal spray Place 1 spray into both nostrils 2 (two) times a day.  11/04/18  Yes [provider]  furosemide (LASIX) 20 MG tablet TAKE 2 TABLETS EVERY DAY 12/12/20  Yes Masoud, [name], MD  gabapentin (NEURONTIN) 300 MG capsule Take 1 capsule (300 mg total) by mouth 3 (three) times daily. 09/13/20  Yes Masoud, [name], MD  glipiZIDE (GLUCOTROL XL) 5 MG 24 hr tablet TAKE 1 TABLET TWICE DAILY Patient taking differently: Take 5 mg by mouth 2 (two) times daily before a meal. 01/19/21  Yes Masoud, [name], MD  ipratropium-albuterol (DUONEB) 0.5-2.5 (3) MG/3ML SOLN Take 3 mLs by nebulization every 4 (four) hours as needed. Patient taking differently: Take 3 mLs by nebulization every 4 (four) hours as needed (shortness of breath or wheezing). 10/26/20  Yes Masoud, [name], MD  losartan-hydrochlorothiazide (HYZAAR) 100-25 MG tablet TAKE 1 TABLET EVERY DAY 05/19/20  Yes Masoud, [name], MD  metFORMIN (GLUCOPHAGE) 500 MG tablet TAKE 1 TABLET TWICE DAILY Patient taking differently: Take 500 mg by mouth 2 (two) times daily with a meal. 04/25/20  Yes Masoud, [name], MD  Omega-3 Fatty Acids (FISH OIL) 1000 MG CAPS Take 2 capsules by mouth 2 (two) times daily.    Yes [provider]  traZODone (DESYREL) 50 MG tablet TAKE 2 TABLETS EVERY DAY Patient taking differently: Take 100 mg by mouth at bedtime. 07/28/20  Yes Masoud, [name], MD  vitamin C (ASCORBIC ACID) 500 MG tablet Take 500 mg by mouth daily.   Yes [provider]  levofloxacin (LEVAQUIN) 500 MG tablet Take 1 tablet (500 mg total) by mouth daily. Patient not taking: No sig reported 09/13/20   [name], MD  levofloxacin (LEVAQUIN) 500 MG tablet Take 1 tablet (500 mg total) by mouth daily. Patient not taking: No sig reported 10/26/20   [name], MD  methylPREDNISolone (MEDROL  DOSEPAK) 4 MG TBPK tablet Medrol Dosepak as directed 4 mg Patient not taking: No sig reported 10/26/20   [name], MD    Scheduled Meds: . vitamin C  500 mg Oral Daily  . aspirin EC  81 mg Oral Daily  . atorvastatin  40 mg Oral Daily  . budesonide (PULMICORT) nebulizer solution  0.5 mg Nebulization BID  .  Chlorhexidine Gluconate Cloth  6 each Topical Q0600  . enoxaparin (LOVENOX) injection  50 mg Subcutaneous Q24H  . fluticasone  1 spray Each Nare BID  . furosemide  40 mg Intravenous Q8H  . gabapentin  300 mg Oral TID  . insulin aspart  0-20 Units Subcutaneous TID AC & HS  . ipratropium-albuterol  3 mL Nebulization Q6H  . losartan  25 mg Oral Daily  . methylPREDNISolone (SOLU-MEDROL) injection  20 mg Intravenous Q12H  . metoprolol succinate  12.5 mg Oral Daily  . mometasone-formoterol  2 puff Inhalation BID  . mupirocin ointment  1 application Nasal BID  . omega-3 acid ethyl esters  2 g Oral BID  . sodium chloride flush  3 mL Intravenous Q12H  . traZODone  100 mg Oral QHS   Continuous Infusions: . sodium chloride     PRN Meds:.sodium chloride, acetaminophen, clonazepam, cyclobenzaprine, guaiFENesin-dextromethorphan, morphine injection, ondansetron (ZOFRAN) IV, sodium chloride flush   Resolved Hospital Problem list     ASSESSMENT & PLAN  Acute Hypoxic Hypercapnic Respiratory Failure secondary to Acute Decompensated HFpEF & COPD with evidence of acute exacerbation -Supplemental O2 as needed to maintain O2 saturations 88 to 92% -BiPAP as needed -High risk for intubation -Follow intermittent ABG and chest x-ray as needed -Repeat CXR on 6/3 with Cardiomegaly with mild interstitial pulmonary edema, unchanged. No pleural effusion -Diuresis as BP and renal function permits ~ will change to Diamox + Metolazone today 6/6 due to contraction alkalosis -IV Steroids ~ will wean to 20 mg IV daily today 6/6 -Bronchodilators -Budesonide nebs -Sputum culture from 01/27/21 with  STREPTOCOCCUS AGALACTIAE ~ will start Ceftriaxone today 01/30/21   Acute on chronic Diastolic HFpEF  Hypertension Hx: HLD  -Continuous cardiac monitoring -Maintain MAP greater than 65 -Diuresis as BP and renal function permits -Hold HCTZ, metoprolol. -Continue low dose Losartan -Atorvastatin 40 mg -Repeat 2D Echocardiogram shows Left ventricular ejection fraction, by estimation, is 50 to 55%.  Hypokinesis of septal wall. Left ventricular diastolic parameters are consistent with Grade II diastolic dysfunction (pseudonormalization).The right ventricular size is severely enlarged. -Cardiology following, appreciate input  Contraction Metabolic Alkalosis, suspect due to IV Lasix -Monitor I&O's / urinary output -Follow BMP -Ensure adequate renal perfusion -Avoid nephrotoxic agents as able -Replace electrolytes as indicated -Discussed with Dr. Mortimer Fries, will hold Lasix and give Diamox + Metolazone   Acute Metabolic Encephalopathy due to Hypercapnia now improved -Off precedex -Provide supportive care -BiPAP to treat Hypercapnia   Diabetes mellitus -CBGs -Sliding scale insulin -Follow ICU hyper/hypoglycemia protocol -Hold home Glipizide     Best practice (right click and "Reselect all SmartList Selections" daily)  Diet:  Oral Pain/Anxiety/Delirium protocol (if indicated): No VAP protocol (if indicated): Not indicated DVT prophylaxis: LMWH GI prophylaxis: PPI Glucose control:  SSI Yes Central venous access:  N/A Arterial line:  N/A Foley:  Yes, and it is still needed Mobility:  OOB as tolerated  PT consulted: Yes Last date of multidisciplinary goals of care discussion [01/30/21] Code Status:  Limited Code (NO CPR) Disposition: ICU  Critical care time: 40 minutes     Darel Hong, AGACNP-BC [facility] Prefer epic messenger for cross cover needs If after hours, please call E-link

## 2021-01-30 NOTE — Progress Notes (Signed)
Progress Note  Patient Name: Julian Sparks Date of Encounter: 01/30/2021  Primary Cardiologist: New to [facility] - consult by Rockey Situ  Subjective   He is alert and oriented today.  He continues to be on high flow nasal cannula.  He was switched from IV furosemide to Diamox with metolazone given increased CO2 and concern for contraction alkalosis.  His urine output seems to be excellent.  Inpatient Medications    Scheduled Meds: . acetaZOLAMIDE  500 mg Oral BID  . vitamin C  500 mg Oral Daily  . aspirin EC  81 mg Oral Daily  . atorvastatin  40 mg Oral Daily  . budesonide (PULMICORT) nebulizer solution  0.5 mg Nebulization BID  . Chlorhexidine Gluconate Cloth  6 each Topical Q0600  . enoxaparin (LOVENOX) injection  50 mg Subcutaneous Q24H  . fluticasone  1 spray Each Nare BID  . gabapentin  300 mg Oral TID  . insulin aspart  0-20 Units Subcutaneous TID AC & HS  . insulin glargine  10 Units Subcutaneous Daily  . ipratropium-albuterol  3 mL Nebulization Q6H  . losartan  25 mg Oral Daily  . methylPREDNISolone (SOLU-MEDROL) injection  20 mg Intravenous Daily  . metolazone  2.5 mg Oral Daily  . metoprolol succinate  12.5 mg Oral Daily  . mometasone-formoterol  2 puff Inhalation BID  . mupirocin ointment  1 application Nasal BID  . omega-3 acid ethyl esters  2 g Oral BID  . sodium chloride flush  3 mL Intravenous Q12H  . traZODone  100 mg Oral QHS   Continuous Infusions: . sodium chloride    . cefTRIAXone (ROCEPHIN)  IV 2 g (01/30/21 1117)   PRN Meds: sodium chloride, acetaminophen, clonazepam, cyclobenzaprine, guaiFENesin-dextromethorphan, morphine injection, ondansetron (ZOFRAN) IV, sodium chloride flush   Vital Signs    Vitals:   01/30/21 1350 01/30/21 1400 01/30/21 1500 01/30/21 1600  BP:  123/60    Pulse: 86 90 96 88  Resp: 18 20 20 16   Temp:      TempSrc:      SpO2: 97% 96% 90% 96%  Weight:        Intake/Output Summary (Last 24 hours) at 01/30/2021 1659 Last data  filed at 01/30/2021 1600 Gross per 24 hour  Intake 840 ml  Output 5250 ml  Net -4410 ml   Filed Weights   01/27/21 0800 01/28/21 0500 01/29/21 0350  Weight: 104.4 kg 103 kg 103.2 kg    Telemetry    SR with PVCs - Personally Reviewed  ECG    No new tracings - Personally Reviewed  Physical Exam   GEN: No acute distress.   Neck: JVD elevated ~ 8 cm. Cardiac: RRR, no murmurs, rubs, or gallops.  Respiratory: Continued diminished breath sounds bilaterally with expiratory wheezing. On HFNC. GI: Soft, nontender, non-distended.   MS:  Mild bilateral pretibial lower extremity edema with legs wrapped; No deformity. Neuro:  Alert and oriented x 3; Nonfocal.  Psych: Normal affect.  Labs    Chemistry Recent Labs  Lab 01/24/21 1405 01/25/21 0442 01/28/21 0439 01/29/21 0446 01/30/21 0651  NA  --    < > 143 138 140  K  --    < > 5.7*  5.6* 4.7 4.9  CL  --    < > 96* 91* 88*  CO2  --    < > 36* 36* 41*  GLUCOSE  --    < > 185* 225* 209*  BUN  --    < >  40* 46* 41*  CREATININE  --    < > 1.15 0.94 0.89  CALCIUM  --    < > 8.6* 8.6* 9.0  PROT 6.9  --   --   --   --   ALBUMIN 3.6  --   --   --   --   AST 20  --   --   --   --   ALT 18  --   --   --   --   ALKPHOS 67  --   --   --   --   BILITOT 1.1  --   --   --   --   GFRNONAA  --    < > >60 >60 >60  ANIONGAP  --    < > 11 11 11    < > = values in this interval not displayed.     Hematology Recent Labs  Lab 01/28/21 0439 01/29/21 0446 01/30/21 0651  WBC 6.8 13.1* 11.2*  RBC 4.13* 3.77* 3.75*  HGB 12.1* 11.3* 11.3*  HCT 41.0 37.0* 37.2*  MCV 99.3 98.1 99.2  MCH 29.3 30.0 30.1  MCHC 29.5* 30.5 30.4  RDW 14.6 14.6 14.8  PLT 221 231 224    Cardiac EnzymesNo results for input(s): TROPONINI in the last 168 hours. No results for input(s): TROPIPOC in the last 168 hours.   BNP Recent Labs  Lab 01/25/21 0442  BNP 278.7*     DDimer No results for input(s): DDIMER in the last 168 hours.   Radiology    DG Chest  2 View  Result Date: 01/24/2021 IMPRESSION: Cardiomegaly bilateral interstitial prominence most consistent interstitial edema. Findings consistent with CHF. Electronically Signed   By: Marcello Moores  Register   On: 01/24/2021 11:30   US Venous Img Lower Bilateral (DVT)  Result Date: 01/26/2021 IMPRESSION: Negative. Electronically Signed   By: Donavan Foil M.D.   On: 01/26/2021 01:34    Cardiac Studies   2D echo 01/24/2021: Pending __________  2D echo 01/2019: 1. The left ventricle has normal systolic function, with an ejection  fraction of 55-60%. The cavity size was normal. There is mild concentric  left ventricular hypertrophy. Left ventricular diastolic function could  not be evaluated.  2. The right ventricle has normal systolic function. The cavity was  normal. There is no increase in right ventricular wall thickness.  3. Trivial pericardial effusion is present.  4. The mitral valve is grossly normal.  5. The tricuspid valve is grossly normal.  6. The aortic valve is grossly normal.  7. The inferior vena cava was dilated in size with >50% respiratory  variability.  8. very suboptimal study.   Patient Profile     75 y.o. male with history of chronic hypoxic and hypercapnic respiratory failure on supplemental oxygen, COPD, DM2, HTN, venous insufficiency, and obesity admitted on 5/31 with acute on chronic hypoxic and hypercapnic respiratory failure secondary to acute on chronic diastolic CHF, AECOPD, and hyperkalemia.   Assessment & Plan    1. Acute on chronic hypoxic and hypercapnic respiratory failure: -Multifactorial including acute on chronic diastolic CHF and AECOPD -HFNC, wean at tolerated -Will need ischemic workup once his respiratory status is improved as outline below  2.  Acute diastolic heart failure as well as cor pulmonale: He was switched from IV furosemide to Diamox today due to contraction alkalosis with a gradually rising CO2. Echocardiogram during this  admission showed normal LVSF, moderately dilated RV and RA with a significantly  dilated IVC. PA pressure could not be estimated. Diastolic function appear normal -He will benefit from a RHC at a minimum, and ideally a R/LHC if renal function allows, likely later this week once his oxygen requirement decreases.  3. HTN: -Blood pressure soft yesterday, improved today -Metoprolol and losartan   4. HLD: -Liptior  5. AECOPD: -Management per IM -IV steroids, wean when possible to minimize fluids rentention    For questions or updates, please contact [place] Please consult [email or web address] for contact info under Cardiology/STEMI.    Signed, Kathlyn Sacramento, MD [facility] 01/30/2021, 4:59 PM

## 2021-01-30 NOTE — Progress Notes (Signed)
Inpatient Diabetes Program Recommendations  AACE/ADA: New Consensus Statement on Inpatient Glycemic Control (2015)  Target Ranges:  Prepandial:   less than 140 mg/dL      Peak postprandial:   less than 180 mg/dL (1-2 hours)      Critically ill patients:  140 - 180 mg/dL   Results for Julian Sparks, Julian Sparks (MRN [id number]) as of 01/30/2021 07:24  Ref. Range 01/29/2021 07:06 01/29/2021 11:35 01/29/2021 15:43 01/29/2021 19:40  Glucose-Capillary Latest Ref Range: 70 - 99 mg/dL 276 (H)  11 units NOVOLOG  206 (H)  7 units NOVOLOG  227 (H)  7 units NOVOLOG @17 :47  295 (H)  11 units NOVOLOG@21 :00    Results for Julian Sparks, Julian Sparks (MRN [id number]) as of 01/30/2021 08:52  Ref. Range 01/30/2021 08:07  Glucose-Capillary Latest Ref Range: 70 - 99 mg/dL 193 (H)   Results for Julian Sparks, Julian Sparks (MRN [id number]) as of 01/30/2021 07:24  Ref. Range 01/24/2021 11:32  Hemoglobin A1C Latest Ref Range: 4.8 - 5.6 % 6.8 (H)    Admit with: Acute Hypoxic Hypercapnic Respiratory Failure secondary to Acute Decompensated HFpEF&COPD with evidence ofacute exacerbation  History: DM, COPD  Home DM Meds: Glipizide 5 mg BID       Metformin 500 mg BID  Current Orders: Novolog Resistant Correction Scale/ SSI (0-20 units) TID AC + HS    MD- Note patient getting Solumedrol 20 mg BID.  AM CBGs elevated.  If plan is to remain on Solumedrol today, please consider adding weight based Basal insulin:  Lantus 10 units Daily (0.1 units/kg)    --Will follow patient during hospitalization--  Wyn Quaker RN, MSN, CDE Diabetes Coordinator Inpatient Glycemic Control Team Team Pager: [phone] (8a-5p)

## 2021-01-31 ENCOUNTER — Inpatient Hospital Stay: Payer: Medicare HMO

## 2021-01-31 ENCOUNTER — Encounter (INDEPENDENT_AMBULATORY_CARE_PROVIDER_SITE_OTHER): Payer: Medicare HMO

## 2021-01-31 DIAGNOSIS — I5033 Acute on chronic diastolic (congestive) heart failure: Secondary | ICD-10-CM

## 2021-01-31 LAB — CBC
HCT: 37.8 % — ABNORMAL LOW (ref 39.0–52.0)
Hemoglobin: 11.1 g/dL — ABNORMAL LOW (ref 13.0–17.0)
MCH: 29.7 pg (ref 26.0–34.0)
MCHC: 29.4 g/dL — ABNORMAL LOW (ref 30.0–36.0)
MCV: 101.1 fL — ABNORMAL HIGH (ref 80.0–100.0)
Platelets: 227 10*3/uL (ref 150–400)
RBC: 3.74 MIL/uL — ABNORMAL LOW (ref 4.22–5.81)
RDW: 14.6 % (ref 11.5–15.5)
WBC: 11.8 10*3/uL — ABNORMAL HIGH (ref 4.0–10.5)
nRBC: 0 % (ref 0.0–0.2)

## 2021-01-31 LAB — GLUCOSE, CAPILLARY
Glucose-Capillary: 124 mg/dL — ABNORMAL HIGH (ref 70–99)
Glucose-Capillary: 133 mg/dL — ABNORMAL HIGH (ref 70–99)
Glucose-Capillary: 211 mg/dL — ABNORMAL HIGH (ref 70–99)
Glucose-Capillary: 274 mg/dL — ABNORMAL HIGH (ref 70–99)
Glucose-Capillary: 135 mg/dL — ABNORMAL HIGH (ref 70–99)

## 2021-01-31 LAB — BASIC METABOLIC PANEL
Anion gap: 5 (ref 5–15)
BUN: 40 mg/dL — ABNORMAL HIGH (ref 8–23)
CO2: 38 mmol/L — ABNORMAL HIGH (ref 22–32)
Calcium: 9.1 mg/dL (ref 8.9–10.3)
Chloride: 94 mmol/L — ABNORMAL LOW (ref 98–111)
Creatinine, Ser: 0.79 mg/dL (ref 0.61–1.24)
GFR, Estimated: >60 mL/min (ref >60)
Glucose, Bld: 114 mg/dL — ABNORMAL HIGH (ref 70–99)
Potassium: 4.3 mmol/L (ref 3.5–5.1)
Sodium: 137 mmol/L (ref 135–145)

## 2021-01-31 LAB — MAGNESIUM: Magnesium: 2.2 mg/dL (ref 1.7–2.4)

## 2021-01-31 LAB — PHOSPHORUS: Phosphorus: 3.4 mg/dL (ref 2.5–4.6)

## 2021-01-31 MED ORDER — ACETAZOLAMIDE 250 MG PO TABS
500.0000 mg | ORAL_TABLET | Freq: Every day | ORAL | Status: DC
  Administered 2021-01-31 – 2021-02-02 (×3): 500 mg via ORAL
  Filled 2021-01-31 (×4): qty 2

## 2021-01-31 MED ORDER — IPRATROPIUM-ALBUTEROL 0.5-2.5 (3) MG/3ML IN SOLN
3.0000 mL | RESPIRATORY_TRACT | Status: DC
  Administered 2021-01-31 – 2021-02-04 (×22): 3 mL via RESPIRATORY_TRACT
  Filled 2021-01-31 (×21): qty 3

## 2021-01-31 NOTE — Progress Notes (Signed)
Progress Note  Patient Name: Julian Sparks Date of Encounter: 01/31/2021  Primary Cardiologist: New to [facility] - consult by Rockey Situ  Subjective   He had agitation again this morning and hypoxia and was placed back on BiPAP.  He continues to have excellent urine output on metolazone and Diamox. He is on 2 L of oxygen at baseline for COPD. He is -14.5 L for the admission.  Admission weight was 106 kg and today's weight is 96 kg.  There might be some variability in weight measurement.   Inpatient Medications    Scheduled Meds: . acetaZOLAMIDE  500 mg Oral Daily  . vitamin C  500 mg Oral Daily  . aspirin EC  81 mg Oral Daily  . atorvastatin  40 mg Oral Daily  . budesonide (PULMICORT) nebulizer solution  0.5 mg Nebulization BID  . Chlorhexidine Gluconate Cloth  6 each Topical Q0600  . enoxaparin (LOVENOX) injection  50 mg Subcutaneous Q24H  . fluticasone  1 spray Each Nare BID  . gabapentin  300 mg Oral TID  . insulin aspart  0-20 Units Subcutaneous TID AC & HS  . insulin glargine  10 Units Subcutaneous Daily  . ipratropium-albuterol  3 mL Nebulization Q4H  . losartan  25 mg Oral Daily  . methylPREDNISolone (SOLU-MEDROL) injection  20 mg Intravenous Daily  . metolazone  2.5 mg Oral Daily  . metoprolol succinate  12.5 mg Oral Daily  . mometasone-formoterol  2 puff Inhalation BID  . mupirocin ointment  1 application Nasal BID  . omega-3 acid ethyl esters  2 g Oral BID  . sodium chloride flush  3 mL Intravenous Q12H  . traZODone  100 mg Oral QHS   Continuous Infusions: . sodium chloride    . cefTRIAXone (ROCEPHIN)  IV 2 g (01/30/21 1117)   PRN Meds: sodium chloride, acetaminophen, clonazepam, cyclobenzaprine, guaiFENesin-dextromethorphan, morphine injection, ondansetron (ZOFRAN) IV, sodium chloride flush   Vital Signs    Vitals:   01/31/21 0655 01/31/21 0700 01/31/21 0800 01/31/21 0801  BP:  (!) 86/73 114/65   Pulse: 96 84 77   Resp:      Temp:      TempSrc:      SpO2:  (!) 74% 98% 99% 100%  Weight:        Intake/Output Summary (Last 24 hours) at 01/31/2021 0911 Last data filed at 01/31/2021 0600 Gross per 24 hour  Intake 240 ml  Output 5200 ml  Net -4960 ml   Filed Weights   01/28/21 0500 01/29/21 0350 01/31/21 0240  Weight: 103 kg 103.2 kg 96 kg    Telemetry    SR with PVCs - Personally Reviewed  ECG    No new tracings - Personally Reviewed  Physical Exam   GEN:  The patient is on BiPAP with some agitation Neck: JVD elevated ~ 8 cm. Cardiac: RRR, no murmurs, rubs, or gallops.  Respiratory: Continued diminished breath sounds bilaterally with expiratory wheezing. On HFNC. GI: Soft, nontender, non-distended.   MS:  Mild bilateral pretibial lower extremity edema with legs wrapped; No deformity. Neuro:  Alert and oriented x 3; Nonfocal.  Psych: Normal affect.  Labs    Chemistry Recent Labs  Lab 01/24/21 1405 01/25/21 0442 01/29/21 0446 01/30/21 0651 01/31/21 0425  NA  --    < > 138 140 137  K  --    < > 4.7 4.9 4.3  CL  --    < > 91* 88* 94*  CO2  --    < >  36* 41* 38*  GLUCOSE  --    < > 225* 209* 114*  BUN  --    < > 46* 41* 40*  CREATININE  --    < > 0.94 0.89 0.79  CALCIUM  --    < > 8.6* 9.0 9.1  PROT 6.9  --   --   --   --   ALBUMIN 3.6  --   --   --   --   AST 20  --   --   --   --   ALT 18  --   --   --   --   ALKPHOS 67  --   --   --   --   BILITOT 1.1  --   --   --   --   GFRNONAA  --    < > >60 >60 >60  ANIONGAP  --    < > 11 11 5    < > = values in this interval not displayed.     Hematology Recent Labs  Lab 01/29/21 0446 01/30/21 0651 01/31/21 0425  WBC 13.1* 11.2* 11.8*  RBC 3.77* 3.75* 3.74*  HGB 11.3* 11.3* 11.1*  HCT 37.0* 37.2* 37.8*  MCV 98.1 99.2 101.1*  MCH 30.0 30.1 29.7  MCHC 30.5 30.4 29.4*  RDW 14.6 14.8 14.6  PLT 231 224 227    Cardiac EnzymesNo results for input(s): TROPONINI in the last 168 hours. No results for input(s): TROPIPOC in the last 168 hours.   BNP Recent Labs  Lab  01/25/21 0442  BNP 278.7*     DDimer No results for input(s): DDIMER in the last 168 hours.   Radiology    DG Chest 2 View  Result Date: 01/24/2021 IMPRESSION: Cardiomegaly bilateral interstitial prominence most consistent interstitial edema. Findings consistent with CHF. Electronically Signed   By: Marcello Moores  Register   On: 01/24/2021 11:30   US Venous Img Lower Bilateral (DVT)  Result Date: 01/26/2021 IMPRESSION: Negative. Electronically Signed   By: Donavan Foil M.D.   On: 01/26/2021 01:34    Cardiac Studies   2D echo 01/24/2021: Pending __________  2D echo 01/2019: 1. The left ventricle has normal systolic function, with an ejection  fraction of 55-60%. The cavity size was normal. There is mild concentric  left ventricular hypertrophy. Left ventricular diastolic function could  not be evaluated.  2. The right ventricle has normal systolic function. The cavity was  normal. There is no increase in right ventricular wall thickness.  3. Trivial pericardial effusion is present.  4. The mitral valve is grossly normal.  5. The tricuspid valve is grossly normal.  6. The aortic valve is grossly normal.  7. The inferior vena cava was dilated in size with >50% respiratory  variability.  8. very suboptimal study.   Patient Profile     75 y.o. male with history of chronic hypoxic and hypercapnic respiratory failure on supplemental oxygen, COPD, DM2, HTN, venous insufficiency, and obesity admitted on 5/31 with acute on chronic hypoxic and hypercapnic respiratory failure secondary to acute on chronic diastolic CHF, AECOPD, and hyperkalemia.   Assessment & Plan    1. Acute on chronic hypoxic and hypercapnic respiratory failure: -Multifactorial including acute on chronic diastolic CHF and AECOPD.  He is on 2 L of oxygen at baseline. -HFNC, wean as tolerated. His hypoxia and hypercapnia seems to be out of proportion to his heart failure and suspect that he has significant  underlying lung disease.  2.  Acute diastolic heart failure as well as cor pulmonale: He was switched from IV furosemide to Diamox today due to contraction alkalosis with a gradually rising CO2. Echocardiogram during this admission showed normal LVSF, moderately dilated RV and RA with a significantly dilated IVC. PA pressure could not be estimated. Diastolic function appear normal -He will benefit from a RHC at a minimum, and ideally a R/LHC if renal function allows, likely later this week once his oxygen requirement decreases. -Continue Diamox and metolazone at the current dose.  His urine output continues to be excellent with stable renal function.  3. HTN: -Intermittent hypotension noted.  I will discontinue losartan especially that his ejection fraction is normal.  4. HLD: -Liptior  5. AECOPD: -Management per pulmonary critical care   For questions or updates, please contact [facility] Please consult [email or web address] for contact info under Cardiology/STEMI.    Signed, Kathlyn Sacramento, MD [facility] 01/31/2021, 9:11 AM

## 2021-01-31 NOTE — Progress Notes (Addendum)
NAME:  Julian Sparks, MRN:  [id number], DOB:  Jan 25, 1946, LOS: 7 ADMISSION DATE:  01/24/2021, INITIAL CONSULTATION DATE: 01/27/2021 REFERRING MD: Max Sane MD, CHIEF COMPLAINT: Worsening SOB  Brief Patient Description  75 y.o. male with PMH as below presenting to the ED worsening shortness of breath from his baseline associated with bilateral lower extremity swelling as well as increased abdominal girth for about 2 days. Has generalized anasarca +hypoxia + chronic venous stasis with ulceration and was seen at the vascular clinic where he had on arrival placed bilaterally to both lower extremities. He was admitted to PCU with Severe ACUTE Hypoxic and Hypercapnic Respiratory Failure due to acute severe decompensated combined systloic and diastolic heart failure on BiPAP  Pertinent  Medical History    Chronic bronchitis ([place])  . COPD (chronic obstructive pulmonary disease) ([place])  . Diabetes mellitus without complication ([place])  . Diastolic dysfunction  . Dyspnea   Chronic venous statis  . Hypertension   Significant Hospital Events: Including procedures, antibiotic start and stop dates in addition to other pertinent events   5/31: Admitted for acute diastolic heart failure 6/1: Resp distress and mental status changes, placed on biPAP 6/2: Respiratory status improved, weaned off BiPAP to HFNC 50% FiO2 @ 60L 6/3: Overnight with agitation and unable to tolerate BiPAP with thick tan secretions, resulting in  hypoxia (O2 sats 60's) during coughing episode, transferred to [facility] ~ now weaned back to HFNC, add IV steroids for AECOPD 6/5: Tolerated BiPAP for 3 hours last night 6/6: Remains on HHFNC (currently at 87% FiO2),BMP concerning for Contraction Metabolic Alkalosis due to aggressive Diuresis with Lasix ~ will hold Lasix and give Diamox + Metolazone; Sputum culture with Streptococcus Agalactiae ~ will start Ceftriaxone  6/7: Placed on BiPAP this morning due to SOB, CXR with new RUL alveolar  infiltrate (placed on Rocephin yesterday and sputum cx with Streptococcus Agalactiae. Will decrease Diamox to 500 mg daily, continue Metolazone 2.5 mg daily  Cultures:  01/24/2021: SARS-CoV-2 PCR>> negative 01/24/2021: Influenza PCR>> negative 01/27/2021: MRSA PCR>> negative 01/27/2021: Sputum>> STREPTOCOCCUS AGALACTIAE  Antimicrobials:  Ceftriaxone 6/6>>  Interim History / Subjective:  -Placed on BiPAP earlier this morning @ 06:00 due to complaints of SOB -CXR with morning with new RUL alveolar infiltrate/edema; sputum cx with Streptococcus Agalactiae ~ placed on Rocephin yesterday -Urine output 6.85 L yesterday (net - 15L since admit) -Creatinine remains stable 0.79; Bicarb improved to 38 (41) with addition of Diamox yesterday -Afebrile, hemodynamically stable, no vasopressors -Pt is awake and alert on BiPAP, reports SOB and slight wheezing ~ plan to transition back to [facility] -Denies chest pain, palpitations, abdominal pain, N/V/D, fever, chills   FiO2 (%):  [50 %-65 %] 50 %  OBJECTIVE   Blood pressure 114/65, pulse 77, temperature 98.8 F (37.1 C), temperature source Oral, resp. rate 16, weight 96 kg, SpO2 100 %.    FiO2 (%):  [50 %-65 %] 50 %   Intake/Output Summary (Last 24 hours) at 01/31/2021 0830 Last data filed at 01/31/2021 0600 Gross per 24 hour  Intake 360 ml  Output 5550 ml  Net -5190 ml   Filed Weights   01/28/21 0500 01/29/21 0350 01/31/21 0240  Weight: 103 kg 103.2 kg 96 kg    Physical Examination: GENERAL: Acute on chronically ill-appearing male, Laying in bed , on BiPAP, no acute distress HEAD: Normocephalic, atraumatic.  EYES: Pupils equal, round, reactive to light.  No scleral icterus.  MOUTH: Moist mucosal membrane. NECK: Supple, positive JVD PULMONARY: Coarse breath  sounds bilaterally when mild expiratory wheezing, BiPAP assisted, even CARDIOVASCULAR: S1 and S2. Regular rate and rhythm. No murmurs, rubs, or gallops.  GASTROINTESTINAL: Soft, nontender,  -distended. Positive bowel sounds.  MUSCULOSKELETAL:1+edema bilateral lower extremity edema with legs wrapped, no deformities.  NEUROLOGIC: Awake, alert and oriented x3, moves all extremities to command, no focal deficits, speech clear, pupils PERRLA SKIN: Limited exam- intact,warm,dry  Labs/imaging that I havepersonally reviewed  (right click and "Reselect all SmartList Selections" daily)  Labs 01/30/2021: Cl 94, Bicarb 38, glucose 114, BUN 40, Cr. 0.79, WBC 11.8, Hgb 11.1, Hct 37.8  Chest x-ray 01/31/2021:Mediastinum and hilar structures normal. Cardiomegaly again noted. New prominent right upper lobe alveolar infiltrate/edema. Persistent bilateral interstitial prominence consistent with interstitial edema and or pneumonitis. Tiny right pleural effusion. No pneumothorax Venous ultrasound bilateral lower extremities 6/2>> negative  Labs   CBC: Recent Labs  Lab 01/27/21 0524 01/28/21 0439 01/29/21 0446 01/30/21 0651 01/31/21 0425  WBC 9.6 6.8 13.1* 11.2* 11.8*  HGB 10.8* 12.1* 11.3* 11.3* 11.1*  HCT 35.2* 41.0 37.0* 37.2* 37.8*  MCV 99.2 99.3 98.1 99.2 101.1*  PLT 227 221 231 224 841    Basic Metabolic Panel: Recent Labs  Lab 01/27/21 0524 01/28/21 0439 01/29/21 0446 01/30/21 0651 01/31/21 0425  NA 139 143 138 140 137  K 4.0 5.7*  5.6* 4.7 4.9 4.3  CL 95* 96* 91* 88* 94*  CO2 34* 36* 36* 41* 38*  GLUCOSE 119* 185* 225* 209* 114*  BUN 31* 40* 46* 41* 40*  CREATININE 1.10 1.15 0.94 0.89 0.79  CALCIUM 8.2* 8.6* 8.6* 9.0 9.1  MG  --  2.2 2.0 2.3 2.2  PHOS  --  5.1* 3.6 3.4 3.4   GFR: Estimated Creatinine Clearance: 85.2 mL/min (by C-G formula based on SCr of 0.79 mg/dL). Recent Labs  Lab 01/28/21 0439 01/29/21 0446 01/30/21 0651 01/31/21 0425  WBC 6.8 13.1* 11.2* 11.8*    Liver Function Tests: Recent Labs  Lab 01/24/21 1405  AST 20  ALT 18  ALKPHOS 67  BILITOT 1.1  PROT 6.9  ALBUMIN 3.6   No results for input(s): LIPASE, AMYLASE in the last 168  hours. No results for input(s): AMMONIA in the last 168 hours.  ABG    Component Value Date/Time   PHART 7.34 (L) 01/27/2021 0115   PCO2ART 77 (HH) 01/27/2021 0115   PO2ART 72 (L) 01/27/2021 0115   HCO3 41.5 (H) 01/27/2021 0115   O2SAT 93.3 01/27/2021 0115     Coagulation Profile: No results for input(s): INR, PROTIME in the last 168 hours.  Cardiac Enzymes: No results for input(s): CKTOTAL, CKMB, CKMBINDEX, TROPONINI in the last 168 hours.  HbA1C: Hgb A1c MFr Bld  Date/Time Value Ref Range Status  01/24/2021 11:32 AM 6.8 (H) 4.8 - 5.6 % Final    Comment:    (NOTE)         Prediabetes: 5.7 - 6.4         Diabetes: >6.4         Glycemic control for adults with diabetes: <7.0   02/05/2019 02:07 AM 7.3 (H) 4.8 - 5.6 % Final    Comment:    (NOTE) Pre diabetes:          5.7%-6.4% Diabetes:              >6.4% Glycemic control for   <7.0% adults with diabetes     CBG: Recent Labs  Lab 01/30/21 0807 01/30/21 1129 01/30/21 1513 01/30/21 2142 01/31/21 [place]  193* 234* 294* 265* 124*    Allergies Allergies  Allergen Reactions  . Aspirin Nausea Only and Other (See Comments)    325 mg aspirin cause stomach upset  . Penicillins Rash     Home Medications  Prior to Admission medications   Medication Sig Start Date End Date Taking? Authorizing Provider  ACCU-CHEK AVIVA PLUS test strip TEST BLOOD SUGAR TWICE DAILY 11/01/20  Yes Masoud, [name], MD  Accu-Chek Softclix Lancets lancets Check blood sugar two times daily 11/01/20  Yes Masoud, [name], MD  albuterol (VENTOLIN HFA) 108 (90 Base) MCG/ACT inhaler Inhale 2 puffs into the lungs as needed for wheezing or shortness of breath. 11/09/20  Yes Masoud, [name], MD  Alcohol Swabs (B-D SINGLE USE SWABS REGULAR) PADS USE AS DIRECTED 12/27/20  Yes Masoud, [name], MD  aspirin 81 MG EC tablet Take 81 mg by mouth daily. Swallow whole.    Yes [provider]  atorvastatin (LIPITOR) 40 MG tablet TAKE 1 TABLET EVERY DAY 05/05/20   Yes Masoud, [name], MD  B Complex-C (SUPER B COMPLEX PO) Take 1 tablet by mouth daily.    Yes [provider]  Blood Glucose Monitoring Suppl (ACCU-CHEK AVIVA PLUS) w/Device KIT Check blood sugar BID 11/01/20  Yes Masoud, [name], MD  budesonide-formoterol (SYMBICORT) 160-4.5 MCG/ACT inhaler Inhale 2 puffs into the lungs 2 (two) times daily. 11/01/20  Yes Masoud, [name], MD  cyclobenzaprine (FLEXERIL) 10 MG tablet Take 10 mg by mouth at bedtime as needed for muscle spasms.   Yes [provider]  fluticasone (FLONASE) 50 MCG/ACT nasal spray Place 1 spray into both nostrils 2 (two) times a day.  11/04/18  Yes [provider]  furosemide (LASIX) 20 MG tablet TAKE 2 TABLETS EVERY DAY 12/12/20  Yes Masoud, [name], MD  gabapentin (NEURONTIN) 300 MG capsule Take 1 capsule (300 mg total) by mouth 3 (three) times daily. 09/13/20  Yes Masoud, [name], MD  glipiZIDE (GLUCOTROL XL) 5 MG 24 hr tablet TAKE 1 TABLET TWICE DAILY Patient taking differently: Take 5 mg by mouth 2 (two) times daily before a meal. 01/19/21  Yes Masoud, [name], MD  ipratropium-albuterol (DUONEB) 0.5-2.5 (3) MG/3ML SOLN Take 3 mLs by nebulization every 4 (four) hours as needed. Patient taking differently: Take 3 mLs by nebulization every 4 (four) hours as needed (shortness of breath or wheezing). 10/26/20  Yes Masoud, [name], MD  losartan-hydrochlorothiazide (HYZAAR) 100-25 MG tablet TAKE 1 TABLET EVERY DAY 05/19/20  Yes Masoud, [name], MD  metFORMIN (GLUCOPHAGE) 500 MG tablet TAKE 1 TABLET TWICE DAILY Patient taking differently: Take 500 mg by mouth 2 (two) times daily with a meal. 04/25/20  Yes Masoud, [name], MD  Omega-3 Fatty Acids (FISH OIL) 1000 MG CAPS Take 2 capsules by mouth 2 (two) times daily.    Yes [provider]  traZODone (DESYREL) 50 MG tablet TAKE 2 TABLETS EVERY DAY Patient taking differently: Take 100 mg by mouth at bedtime. 07/28/20  Yes Masoud, [name], MD  vitamin C (ASCORBIC ACID) 500 MG tablet Take 500  mg by mouth daily.   Yes [provider]  levofloxacin (LEVAQUIN) 500 MG tablet Take 1 tablet (500 mg total) by mouth daily. Patient not taking: No sig reported 09/13/20   [name], MD  levofloxacin (LEVAQUIN) 500 MG tablet Take 1 tablet (500 mg total) by mouth daily. Patient not taking: No sig reported 10/26/20   [name], MD  methylPREDNISolone (MEDROL DOSEPAK) 4 MG TBPK tablet Medrol Dosepak as directed 4 mg Patient not taking:  No sig reported 10/26/20   [name], MD    Scheduled Meds: . acetaZOLAMIDE  500 mg Oral BID  . vitamin C  500 mg Oral Daily  . aspirin EC  81 mg Oral Daily  . atorvastatin  40 mg Oral Daily  . budesonide (PULMICORT) nebulizer solution  0.5 mg Nebulization BID  . Chlorhexidine Gluconate Cloth  6 each Topical Q0600  . enoxaparin (LOVENOX) injection  50 mg Subcutaneous Q24H  . fluticasone  1 spray Each Nare BID  . gabapentin  300 mg Oral TID  . insulin aspart  0-20 Units Subcutaneous TID AC & HS  . insulin glargine  10 Units Subcutaneous Daily  . ipratropium-albuterol  3 mL Nebulization Q6H  . losartan  25 mg Oral Daily  . methylPREDNISolone (SOLU-MEDROL) injection  20 mg Intravenous Daily  . metolazone  2.5 mg Oral Daily  . metoprolol succinate  12.5 mg Oral Daily  . mometasone-formoterol  2 puff Inhalation BID  . mupirocin ointment  1 application Nasal BID  . omega-3 acid ethyl esters  2 g Oral BID  . sodium chloride flush  3 mL Intravenous Q12H  . traZODone  100 mg Oral QHS   Continuous Infusions: . sodium chloride    . cefTRIAXone (ROCEPHIN)  IV 2 g (01/30/21 1117)   PRN Meds:.sodium chloride, acetaminophen, clonazepam, cyclobenzaprine, guaiFENesin-dextromethorphan, morphine injection, ondansetron (ZOFRAN) IV, sodium chloride flush   Resolved Hospital Problem list     ASSESSMENT & PLAN   Acute Hypoxic Hypercapnic Respiratory Failure secondary to Acute Decompensated HFpEF, AECOPD, & STREPTOCOCCUS AGALACTIAE Pneumonia, &  post  COVID Pneumonitis/fibrosis -Supplemental O2 as needed to maintain O2 saturations 88 to 92% -BiPAP as needed -High risk for intubation -Follow intermittent ABG and chest x-ray as needed -Repeat CXR on 6/7 with new RUL infiltrate -Diuresis as BP and renal function permits ~ continue Diamox + Metolazone due to contraction alkalosis -IV Steroids, continue SoluMedrol 20 mg IV daily -Bronchodilators -Budesonide nebs -Sputum culture from 01/27/21 with STREPTOCOCCUS AGALACTIAE ~ continue Ceftriaxone   Acute on chronic Diastolic HFpEF  Hypertension Hx: HLD  -Continuous cardiac monitoring -Maintain MAP greater than 65 -Diuresis as BP and renal function permits ~ continue Diamox + Metalozone -Hold HCTZ, metoprolol. -Continue low dose Losartan -Atorvastatin 40 mg -Repeat 2D Echocardiogram shows Left ventricular ejection fraction, by estimation, is 50 to 55%.  Hypokinesis of septal wall. Left ventricular diastolic parameters are consistent with Grade II diastolic dysfunction (pseudonormalization).The right ventricular size is severely enlarged. -Cardiology following, appreciate input  STREPTOCOCCUS AGALACTIAE Pneumonia -Monitor fever curve -Trend WBC's -Sputum culture on 6/3 with Strep Agalactiae -CXR on 6/7 with new RUL infiltrate -Continue Ceftriaxone (started on 6/6)  Contraction Metabolic Alkalosis, suspect due to IV Lasix~ improving -Monitor I&O's / urinary output -Follow BMP -Ensure adequate renal perfusion -Avoid nephrotoxic agents as able -Replace electrolytes as indicated -Will decrease Diamox to 500 mg daily, Continue Metolazone 2.5 mg daily   Acute Metabolic Encephalopathy due to Hypercapnia now improved -Off precedex -Provide supportive care -BiPAP to treat Hypercapnia   Diabetes mellitus -CBGs -Sliding scale insulin & 10 units Lantus daily -Follow ICU hyper/hypoglycemia protocol -Hold home Glipizide     Best practice (right click and "Reselect all SmartList  Selections" daily)  Diet:  Oral Pain/Anxiety/Delirium protocol (if indicated): No VAP protocol (if indicated): Not indicated DVT prophylaxis: LMWH GI prophylaxis: PPI Glucose control:  SSI Yes Central venous access:  N/A Arterial line:  N/A Foley:  Yes, and it is still needed Mobility:  OOB as tolerated  PT consulted: Yes Last date of multidisciplinary goals of care discussion [01/31/21] Code Status:  Limited Code (NO CPR) Disposition: ICU  Updated pt's daughter at bedside 01/31/21.  All questions answered.  Critical care time: 32 minutes     Darel Hong, AGACNP-BC [facility] Prefer epic messenger for cross cover needs If after hours, please call E-link

## 2021-01-31 NOTE — Progress Notes (Signed)
PT Cancellation Note  Patient Details Name: Julian Sparks MRN: [id number] DOB: [birth year]   Cancelled Treatment:    Reason Eval/Treat Not Completed: Patient not medically ready Spoke with nurse who reports that O2 has continued to have high O2 requirement.  States he had to variously increase O2 support and states "Maybe we give him a little break" and asks for use to try back tomorrow.  Will maintain on caseload and attempt to see when appropriate.  Apparently he did sit up in the recliner for a few hours 2 days ago but has since had up/down respiratory status.   Kreg Shropshire, DPT 01/31/2021, 11:21 AM

## 2021-01-31 NOTE — Progress Notes (Addendum)
@   outset of shift, pt intermittently confused and restless on BIPAP. Pt took off BIPAP. Armstrong placed on pt, 65% 35L. Pt tolerating HHFNC well.   Near 1400, pt desat'ed. Pt increased to 85% 35L. Able to wean pt back down to 65% 35L near 1630.   Over course of day, pt more lucid w/ family @ bedside. Still displays intermittent signs of confusion (Ex: takes off gown/BP cuff).   Foley remains in place. Putting out amber/clear urine, 1.2 L out. Urine became pink/red mid shift, has returned back to amber color.     No PT today d/t desat and WOB.   Pt eating adequately x3 meals.

## 2021-02-01 LAB — MAGNESIUM: Magnesium: 2 mg/dL (ref 1.7–2.4)

## 2021-02-01 LAB — GLUCOSE, CAPILLARY
Glucose-Capillary: 128 mg/dL — ABNORMAL HIGH (ref 70–99)
Glucose-Capillary: 213 mg/dL — ABNORMAL HIGH (ref 70–99)
Glucose-Capillary: 312 mg/dL — ABNORMAL HIGH (ref 70–99)
Glucose-Capillary: 276 mg/dL — ABNORMAL HIGH (ref 70–99)

## 2021-02-01 LAB — BASIC METABOLIC PANEL
Anion gap: 10 (ref 5–15)
BUN: 39 mg/dL — ABNORMAL HIGH (ref 8–23)
CO2: 34 mmol/L — ABNORMAL HIGH (ref 22–32)
Calcium: 9 mg/dL (ref 8.9–10.3)
Chloride: 94 mmol/L — ABNORMAL LOW (ref 98–111)
Creatinine, Ser: 0.86 mg/dL (ref 0.61–1.24)
GFR, Estimated: >60 mL/min (ref >60)
Glucose, Bld: 126 mg/dL — ABNORMAL HIGH (ref 70–99)
Potassium: 4.1 mmol/L (ref 3.5–5.1)
Sodium: 138 mmol/L (ref 135–145)

## 2021-02-01 LAB — CBC
HCT: 36.3 % — ABNORMAL LOW (ref 39.0–52.0)
Hemoglobin: 11 g/dL — ABNORMAL LOW (ref 13.0–17.0)
MCH: 30.1 pg (ref 26.0–34.0)
MCHC: 30.3 g/dL (ref 30.0–36.0)
MCV: 99.2 fL (ref 80.0–100.0)
Platelets: 197 10*3/uL (ref 150–400)
RBC: 3.66 MIL/uL — ABNORMAL LOW (ref 4.22–5.81)
RDW: 14.7 % (ref 11.5–15.5)
WBC: 9.8 10*3/uL (ref 4.0–10.5)
nRBC: 0 % (ref 0.0–0.2)

## 2021-02-01 LAB — PHOSPHORUS: Phosphorus: 3.8 mg/dL (ref 2.5–4.6)

## 2021-02-01 MED ORDER — INSULIN ASPART 100 UNIT/ML IJ SOLN
3.0000 [IU] | Freq: Three times a day (TID) | INTRAMUSCULAR | Status: DC
  Administered 2021-02-01 – 2021-02-04 (×9): 3 [IU] via SUBCUTANEOUS
  Filled 2021-02-01 (×9): qty 1

## 2021-02-01 NOTE — Progress Notes (Signed)
     Re: Julian Sparks   To Whom It May Concern,            This letter is to confirm that [name] has been admitted to [facility] with multiple medical issues and is currently in the Intensive Care Unit. Admission date is 5/31 to the presesnt time.       Corrin Parker, M.D.  [facility]  Medical Director [place] Director [place] Department  [phone]

## 2021-02-01 NOTE — Progress Notes (Signed)
Inpatient Diabetes Program Recommendations  AACE/ADA: New Consensus Statement on Inpatient Glycemic Control (2015)  Target Ranges:  Prepandial:   less than 140 mg/dL      Peak postprandial:   less than 180 mg/dL (1-2 hours)      Critically ill patients:  140 - 180 mg/dL  Results for Julian Sparks, Julian Sparks (MRN [id number]) as of 02/01/2021 11:36  Ref. Range 02/01/2021 08:04 02/01/2021 11:19  Glucose-Capillary Latest Ref Range: 70 - 99 mg/dL 128 (H)  3 units NOVOLOG  10 units LANTUS @9 :09 213 (H)   Home DM Meds: Glipizide 5 mg BID                             Metformin 500 mg BID   Current Orders: Novolog 0-20 units TID ac/hs      Lantus 10 units Daily   Solumderol 20 mg Daily   MD- Please consider adding low dose Novolog Meal Coverage:  Novolog 3 units TID with meals  Hold if pt eats <50% of meal, Hold if pt NPO      --Will follow patient during hospitalization--  Wyn Quaker RN, MSN, CDE Diabetes Coordinator Inpatient Glycemic Control Team Team Pager: [phone] (8a-5p)

## 2021-02-01 NOTE — Progress Notes (Signed)
Progress Note  Patient Name: Julian Sparks Date of Encounter: 02/01/2021  [facility] Cardiologist: Dr. Rockey Situ  Subjective   Currently on BiPAP mask.  Placed on BiPAP as throughout the night.  Working with respiratory therapy, not able to wean BiPAP mask yet.  Had -2 L over the past 24 hours.  Inpatient Medications    Scheduled Meds: . acetaZOLAMIDE  500 mg Oral Daily  . vitamin C  500 mg Oral Daily  . aspirin EC  81 mg Oral Daily  . atorvastatin  40 mg Oral Daily  . budesonide (PULMICORT) nebulizer solution  0.5 mg Nebulization BID  . Chlorhexidine Gluconate Cloth  6 each Topical Q0600  . enoxaparin (LOVENOX) injection  50 mg Subcutaneous Q24H  . fluticasone  1 spray Each Nare BID  . gabapentin  300 mg Oral TID  . insulin aspart  0-20 Units Subcutaneous TID AC & HS  . insulin aspart  3 Units Subcutaneous TID WC  . insulin glargine  10 Units Subcutaneous Daily  . ipratropium-albuterol  3 mL Nebulization Q4H  . methylPREDNISolone (SOLU-MEDROL) injection  20 mg Intravenous Daily  . metolazone  2.5 mg Oral Daily  . metoprolol succinate  12.5 mg Oral Daily  . mometasone-formoterol  2 puff Inhalation BID  . omega-3 acid ethyl esters  2 g Oral BID  . sodium chloride flush  3 mL Intravenous Q12H  . traZODone  100 mg Oral QHS   Continuous Infusions: . sodium chloride    . cefTRIAXone (ROCEPHIN)  IV 2 g (02/01/21 1122)   PRN Meds: sodium chloride, acetaminophen, clonazepam, cyclobenzaprine, guaiFENesin-dextromethorphan, morphine injection, ondansetron (ZOFRAN) IV, sodium chloride flush   Vital Signs    Vitals:   02/01/21 0800 02/01/21 0900 02/01/21 1000 02/01/21 1100  BP: 107/60 113/63 118/68 (!) 112/96  Pulse: 78 82 85 (!) 102  Resp: 18 20 19 14   Temp: (!) 97.5 F (36.4 C)     TempSrc: Axillary     SpO2: 94% 95% 95% (!) 87%  Weight:        Intake/Output Summary (Last 24 hours) at 02/01/2021 1246 Last data filed at 02/01/2021 1245 Gross per 24 hour  Intake 720 ml   Output 2450 ml  Net -1730 ml   Last 3 Weights 02/01/2021 01/31/2021 01/29/2021  Weight (lbs) 210 lb 8.6 oz 211 lb 10.3 oz 227 lb 8.2 oz  Weight (kg) 95.5 kg 96 kg 103.2 kg      Telemetry    Sinus rhythm- Personally Reviewed  ECG    No new tracing- Personally Reviewed  Physical Exam   GEN:  Mild respiratory distress Neck: No JVD Cardiac: RRR Respiratory:  Rhonchorous breath sounds GI: Soft, nontender, non-distended  MS: No edema; No deformity. Neuro:  Nonfocal  Psych: Normal affect   Labs    High Sensitivity Troponin:   Recent Labs  Lab 01/24/21 1132  TROPONINIHS 13      Chemistry Recent Labs  Lab 01/30/21 0651 01/31/21 0425 02/01/21 0439  NA 140 137 138  K 4.9 4.3 4.1  CL 88* 94* 94*  CO2 41* 38* 34*  GLUCOSE 209* 114* 126*  BUN 41* 40* 39*  CREATININE 0.89 0.79 0.86  CALCIUM 9.0 9.1 9.0  GFRNONAA >60 >60 >60  ANIONGAP 11 5 10      Hematology Recent Labs  Lab 01/30/21 0651 01/31/21 0425 02/01/21 0439  WBC 11.2* 11.8* 9.8  RBC 3.75* 3.74* 3.66*  HGB 11.3* 11.1* 11.0*  HCT 37.2* 37.8* 36.3*  MCV 99.2 101.1* 99.2  MCH 30.1 29.7 30.1  MCHC 30.4 29.4* 30.3  RDW 14.8 14.6 14.7  PLT 224 227 197    BNPNo results for input(s): BNP, PROBNP in the last 168 hours.   DDimer No results for input(s): DDIMER in the last 168 hours.   Radiology    DG Chest Port 1 View  Result Date: 01/31/2021 CLINICAL DATA:  Respiratory failure. EXAM: PORTABLE CHEST 1 VIEW COMPARISON:  01/27/2021. FINDINGS: Mediastinum and hilar structures normal. Cardiomegaly again noted. New prominent right upper lobe alveolar infiltrate/edema. Persistent bilateral interstitial prominence consistent with interstitial edema and or pneumonitis. Tiny right pleural effusion. No pneumothorax. IMPRESSION: 1.  Cardiomegaly again noted. 2. New prominent right upper lobe alveolar infiltrates/edema. Persistent bilateral interstitial prominence consistent interstitial edema and or pneumonitis. Tiny right  pleural effusion. Electronically Signed   By: Marcello Moores  Register   On: 01/31/2021 05:48    Cardiac Studies   Echo 12/2020 Reviewed, EF 55 to 60%  Patient Profile     75 y.o. male with history of HFpEF, COPD, hypoxic respiratory failure presenting with shortness of breath, diagnosed with hypoxia and hypercapnic respiratory failure.  Being seen for HFpEF.  Assessment & Plan    1.  HFpEF -Last EF 55% -Net -2 L over the past 24 hours. -Continue metolazone, Diamox -Appears euvolemic on exam -Etiology of shortness of breath more likely respiratory. -Consider right heart cath when respiratory function improves to assess volume status.  2.  COPD, hypercapnic respiratory failure -BiPAP -Management as per pulmonary medicine, critical care team  3. hld lipitor  Total encounter time 35 minutes  Greater than 50% was spent in counseling and coordination of care with the patient      Signed, Kate Sable, MD  02/01/2021, 12:46 PM

## 2021-02-01 NOTE — Evaluation (Signed)
Physical Therapy Evaluation Patient Details Name: Julian Sparks MRN: [id number] DOB: 1945/11/03 Today's Date: 02/01/2021   History of Present Illness  presented to ER secondary to progressive SOB, LE and abdominal edema; admitted for management of acute/chronic hypoxic, hypercapnic respiratory failure due to CHF/COPD.  Clinical Impression  Patient seated in recliner upon arrival to room; daughter and grand-daughter in room.  Very supportive and encouraging to patient.  Patient alert and oriented; follows commands and eager to participate with session today.  Voices "I feel good today.  I want to get better and go home".  Chronic ROM deficits noted to bilat shoulders (L > R); otherwise, strength and ROM WFL for basic transfers and gait.  Currently requiring min assist for sit/stand and standing balance during standing activities; does require RW for optimal safety/stability and overall energy conservation.  Desat to 86% on HHFNC (35L, 50% FiO2) with exertion, recovers to >90% within 30 seconds of seated rest.  Demonstrates ability to self-pacing activity; fair/good awareness of signs symptoms of fatigue and self-initiated rest periods.  BORG 5/10 end of treatment session. Gait and progressive mobility deferred this session due to [facility]; will continue to assess/progress as medically appropriate. Would benefit from skilled PT to address above deficits and promote optimal return to PLOF.; recommend transition to STR upon discharge from acute hospitalization.  Will continue to monitor progress and update recs as appropriate.     Follow Up Recommendations SNF    Equipment Recommendations       Recommendations for Other Services       Precautions / Restrictions Precautions Precautions: Fall Restrictions Weight Bearing Restrictions: No      Mobility  Bed Mobility               General bed mobility comments: seated in recliner beginning/end of treatment session    Transfers Overall  transfer level: Needs assistance Equipment used: Rolling walker (2 wheeled) Transfers: Sit to/from Stand Sit to Stand: Min assist         General transfer comment: prefers UE placement on RW due to chronic shoulder deficits  Ambulation/Gait             General Gait Details: deferred secondary to [place] Mobility    Modified Rankin (Stroke Patients Only)       Balance Overall balance assessment: Needs assistance Sitting-balance support: No upper extremity supported;Feet supported Sitting balance-Leahy Scale: Good     Standing balance support: Bilateral upper extremity supported Standing balance-Leahy Scale: Fair                               Pertinent Vitals/Pain Pain Assessment: No/denies pain    Home Living Family/patient expects to be discharged to:: Private residence Living Arrangements: Alone Available Help at Discharge: Family;Available PRN/intermittently Type of Home: House Home Access: Ramped entrance     Home Layout: One level Home Equipment: [place] - 4 wheels Additional Comments: Daughter and grand-daughter live next door and provide frequent check in; other daughter comes to stay with patient on weekends    Prior Function Level of Independence: Independent with assistive device(s)         Comments: Mod indep with ADLs, household and community mobilization without assist device; does have access to 4WRW if needed. Home O2 at 2L. Denies fall history.  + driving.     Hand Dominance  Extremity/Trunk Assessment   Upper Extremity Assessment Upper Extremity Assessment:  (R shoulder elevation limited approx 90 degrees (chronic, athritic changes), L shoulder elevation limited approx 30-40 degrees actively (history of TSR).  Bilat elbows, wrists and hands WFL)    Lower Extremity Assessment Lower Extremity Assessment: Overall WFL for tasks assessed (grossly 4/5 throughout)        Communication   Communication: No difficulties  Cognition Arousal/Alertness: Awake/alert Behavior During Therapy: WFL for tasks assessed/performed Overall Cognitive Status: Within Functional Limits for tasks assessed                                        General Comments      Exercises Other Exercises Other Exercises: Standing LE therex, 1-2x10 with RW, cga/min assist: marching, heel raises.  Standing/seated rest periods between sets as needed.  Desat to 86% on HHFNC (35L, 50% FiO2) with exertion, recovers to >90% within 30 seconds of seated rest. Other Exercises: Sit/stand x5 with RW, min assist-patient self-pacing activity; fair/good awareness of signs symptoms of fatigue and self-initiated rest periods   Assessment/Plan    PT Assessment Patient needs continued PT services  PT Problem List Decreased strength;Decreased range of motion;Decreased activity tolerance;Decreased balance;Decreased coordination;Decreased knowledge of use of DME;Decreased safety awareness;Decreased mobility;Decreased knowledge of precautions;Cardiopulmonary status limiting activity       PT Treatment Interventions DME instruction;Gait training;Stair training;Functional mobility training;Therapeutic activities;Patient/family education;Balance training;Therapeutic exercise    PT Goals (Current goals can be found in the Care Plan section)  Acute Rehab PT Goals Patient Stated Goal: to get stronger and get back home! PT Goal Formulation: With patient/family Time For Goal Achievement: 02/15/21 Potential to Achieve Goals: Good    Frequency Min 2X/week   Barriers to discharge        Co-evaluation               AM-PAC PT "6 Clicks" Mobility  Outcome Measure Help needed turning from your back to your side while in a flat bed without using bedrails?: None Help needed moving from lying on your back to sitting on the side of a flat bed without using bedrails?: A Little   Help needed  standing up from a chair using your arms (e.g., wheelchair or bedside chair)?: A Little Help needed to walk in hospital room?: A Little Help needed climbing 3-5 steps with a railing? : A Lot 6 Click Score: 15    End of Session Equipment Utilized During Treatment: Oxygen Activity Tolerance: Patient tolerated treatment well Patient left: in chair;with call bell/phone within reach;with family/visitor present   PT Visit Diagnosis: Difficulty in walking, not elsewhere classified (R26.2);Muscle weakness (generalized) (M62.81)    Time: 3382-5053 PT Time Calculation (min) (ACUTE ONLY): 28 min   Charges:   PT Evaluation $PT Eval Moderate Complexity: 1 Mod PT Treatments $Therapeutic Exercise: 8-22 mins        Sascha Baugher H. Owens Shark, PT, DPT, NCS 02/01/21, 10:02 PM [phone]

## 2021-02-01 NOTE — Progress Notes (Signed)
NAME:  Julian Sparks, MRN:  [id number], DOB:  10/27/1945, LOS: 8 ADMISSION DATE:  01/24/2021, INITIAL CONSULTATION DATE: 01/27/2021 REFERRING MD: Max Sane MD, CHIEF COMPLAINT: Worsening SOB  Brief Patient Description  75 y.o. male with PMH as below presenting to the ED worsening shortness of breath from his baseline associated with bilateral lower extremity swelling as well as increased abdominal girth for about 2 days. Has generalized anasarca +hypoxia + chronic venous stasis with ulceration and was seen at the vascular clinic where he had on arrival placed bilaterally to both lower extremities. He was admitted to PCU with Severe ACUTE Hypoxic and Hypercapnic Respiratory Failure due to acute severe decompensated combined systloic and diastolic heart failure on BiPAP  Pertinent  Medical History    Chronic bronchitis ([place])  . COPD (chronic obstructive pulmonary disease) ([place])  . Diabetes mellitus without complication ([place])  . Diastolic dysfunction  . Dyspnea   Chronic venous statis  . Hypertension   Significant Hospital Events: Including procedures, antibiotic start and stop dates in addition to other pertinent events   5/31: Admitted for acute diastolic heart failure 6/1: Resp distress and mental status changes, placed on biPAP 6/2: Respiratory status improved, weaned off BiPAP to HFNC 50% FiO2 @ 60L 6/3: Overnight with agitation and unable to tolerate BiPAP with thick tan secretions, resulting in  hypoxia (O2 sats 60's) during coughing episode, transferred to [facility] ~ now weaned back to HFNC, add IV steroids for AECOPD 6/5: Tolerated BiPAP for 3 hours last night 6/6: Remains on HHFNC (currently at 87% FiO2),BMP concerning for Contraction Metabolic Alkalosis due to aggressive Diuresis with Lasix ~ will hold Lasix and give Diamox + Metolazone; Sputum culture with Streptococcus Agalactiae ~ will start Ceftriaxone  6/7: Placed on BiPAP this morning due to SOB, CXR with new RUL alveolar  infiltrate (placed on Rocephin yesterday and sputum cx with Streptococcus Agalactiae. Will decrease Diamox to 500 mg daily, continue Metolazone 2.5 mg daily 6/8: On BiPAP overnight (50% FiO2) and tolerating, plan for HHFNC during day, diuresing well and contraction alkalosis continues to improve with Diamox + metolazone  Cultures:  01/24/2021: SARS-CoV-2 PCR>> negative 01/24/2021: Influenza PCR>> negative 01/27/2021: MRSA PCR>> negative 01/27/2021: Sputum>> STREPTOCOCCUS AGALACTIAE  Antimicrobials:  Ceftriaxone 6/6>>  Interim History / Subjective:  -No acute events overnight -On BiPAP overnight (12/8, 50% FiO2) and tolerating ~ plan to switch to [facility] during the day -Awake and alert ~ denies SOB, wheezing, chest pain, abdominal pain, N/V/D, fever, chills -Afebrile, hemodynamically stable -UOP 2.5 L (net - 17L since admit), creatinine remains stable 0.86, Bicarb improved to 34 (38 yesterday) with Diamox -WBC improved to 9.8 (11.8 yesterday)   FiO2 (%):  [50 %-85 %] 50 %  OBJECTIVE   Blood pressure 107/60, pulse 78, temperature (!) 97.5 F (36.4 C), temperature source Axillary, resp. rate 18, weight 95.5 kg, SpO2 94 %.    FiO2 (%):  [50 %-85 %] 50 %   Intake/Output Summary (Last 24 hours) at 02/01/2021 0958 Last data filed at 02/01/2021 0500 Gross per 24 hour  Intake 480 ml  Output 2200 ml  Net -1720 ml   Filed Weights   01/29/21 0350 01/31/21 0240 02/01/21 0500  Weight: 103.2 kg 96 kg 95.5 kg    Physical Examination: GENERAL: Acute on chronically ill-appearing male, sitting in bed , on BiPAP, no acute distress HEAD: Normocephalic, atraumatic.  EYES: Pupils equal, round, reactive to light.  No scleral icterus.  MOUTH: Moist mucosal membrane. NECK: Supple, positive JVD PULMONARY:  Clear breath sounds bilaterally, no wheezing or rales noted, BiPAP assisted, even CARDIOVASCULAR: S1 and S2. Regular rate and rhythm. No murmurs, rubs, or gallops.  GASTROINTESTINAL: Soft, nontender,  -distended. Positive bowel sounds.  MUSCULOSKELETAL:1+edema bilateral lower extremity, no deformities, normal bulk and tone  NEUROLOGIC: Awake, alert and oriented x3, moves all extremities to command, no focal deficits, speech clear, pupils PERRLA SKIN: Limited exam- intact,warm,dry   Review of Systems: Positives in BOLD: Pt currently denies all complaints Gen: Denies fever, chills, weight change, fatigue, night sweats HEENT: Denies blurred vision, double vision, hearing loss, tinnitus, sinus congestion, rhinorrhea, sore throat, neck stiffness, dysphagia PULM: Denies shortness of breath, cough, sputum production, hemoptysis, wheezing CV: Denies chest pain, edema, orthopnea, paroxysmal nocturnal dyspnea, palpitations GI: Denies abdominal pain, nausea, vomiting, diarrhea, hematochezia, melena, constipation, change in bowel habits GU: Denies dysuria, hematuria, polyuria, oliguria, urethral discharge Endocrine: Denies hot or cold intolerance, polyuria, polyphagia or appetite change Derm: Denies rash, dry skin, scaling or peeling skin change Heme: Denies easy bruising, bleeding, bleeding gums Neuro: Denies headache, numbness, weakness, slurred speech, loss of memory or consciousness   Labs/imaging that I havepersonally reviewed  (right click and "Reselect all SmartList Selections" daily)  Labs 02/01/2021: Cl 94, Bicarb 34, glucose 126, BUN 39, Cr. 0.86, WBC 9.8, Hgb 11., Hct 36.3  Chest x-ray 01/31/2021:Mediastinum and hilar structures normal. Cardiomegaly again noted. New prominent right upper lobe alveolar infiltrate/edema. Persistent bilateral interstitial prominence consistent with interstitial edema and or pneumonitis. Tiny right pleural effusion. No pneumothorax Venous ultrasound bilateral lower extremities 6/2>> negative  Labs   CBC: Recent Labs  Lab 01/28/21 0439 01/29/21 0446 01/30/21 0651 01/31/21 0425 02/01/21 0439  WBC 6.8 13.1* 11.2* 11.8* 9.8  HGB 12.1* 11.3* 11.3* 11.1*  11.0*  HCT 41.0 37.0* 37.2* 37.8* 36.3*  MCV 99.3 98.1 99.2 101.1* 99.2  PLT 221 231 224 227 553    Basic Metabolic Panel: Recent Labs  Lab 01/28/21 0439 01/29/21 0446 01/30/21 0651 01/31/21 0425 02/01/21 0439  NA 143 138 140 137 138  K 5.7*  5.6* 4.7 4.9 4.3 4.1  CL 96* 91* 88* 94* 94*  CO2 36* 36* 41* 38* 34*  GLUCOSE 185* 225* 209* 114* 126*  BUN 40* 46* 41* 40* 39*  CREATININE 1.15 0.94 0.89 0.79 0.86  CALCIUM 8.6* 8.6* 9.0 9.1 9.0  MG 2.2 2.0 2.3 2.2 2.0  PHOS 5.1* 3.6 3.4 3.4 3.8   GFR: Estimated Creatinine Clearance: 79.1 mL/min (by C-G formula based on SCr of 0.86 mg/dL). Recent Labs  Lab 01/29/21 0446 01/30/21 0651 01/31/21 0425 02/01/21 0439  WBC 13.1* 11.2* 11.8* 9.8    Liver Function Tests: No results for input(s): AST, ALT, ALKPHOS, BILITOT, PROT, ALBUMIN in the last 168 hours. No results for input(s): LIPASE, AMYLASE in the last 168 hours. No results for input(s): AMMONIA in the last 168 hours.  ABG    Component Value Date/Time   PHART 7.34 (L) 01/27/2021 0115   PCO2ART 77 (HH) 01/27/2021 0115   PO2ART 72 (L) 01/27/2021 0115   HCO3 41.5 (H) 01/27/2021 0115   O2SAT 93.3 01/27/2021 0115     Coagulation Profile: No results for input(s): INR, PROTIME in the last 168 hours.  Cardiac Enzymes: No results for input(s): CKTOTAL, CKMB, CKMBINDEX, TROPONINI in the last 168 hours.  HbA1C: Hgb A1c MFr Bld  Date/Time Value Ref Range Status  01/24/2021 11:32 AM 6.8 (H) 4.8 - 5.6 % Final    Comment:    (NOTE)  Prediabetes: 5.7 - 6.4         Diabetes: >6.4         Glycemic control for adults with diabetes: <7.0   02/05/2019 02:07 AM 7.3 (H) 4.8 - 5.6 % Final    Comment:    (NOTE) Pre diabetes:          5.7%-6.4% Diabetes:              >6.4% Glycemic control for   <7.0% adults with diabetes     CBG: Recent Labs  Lab 01/31/21 1120 01/31/21 1512 01/31/21 1735 01/31/21 2137 02/01/21 0804  GLUCAP 133* 211* 274* 135* 128*     Allergies Allergies  Allergen Reactions  . Aspirin Nausea Only and Other (See Comments)    325 mg aspirin cause stomach upset  . Penicillins Rash     Home Medications  Prior to Admission medications   Medication Sig Start Date End Date Taking? Authorizing Provider  ACCU-CHEK AVIVA PLUS test strip TEST BLOOD SUGAR TWICE DAILY 11/01/20  Yes Masoud, [name], MD  Accu-Chek Softclix Lancets lancets Check blood sugar two times daily 11/01/20  Yes Masoud, [name], MD  albuterol (VENTOLIN HFA) 108 (90 Base) MCG/ACT inhaler Inhale 2 puffs into the lungs as needed for wheezing or shortness of breath. 11/09/20  Yes Masoud, [name], MD  Alcohol Swabs (B-D SINGLE USE SWABS REGULAR) PADS USE AS DIRECTED 12/27/20  Yes Masoud, [name], MD  aspirin 81 MG EC tablet Take 81 mg by mouth daily. Swallow whole.    Yes [provider]  atorvastatin (LIPITOR) 40 MG tablet TAKE 1 TABLET EVERY DAY 05/05/20  Yes Masoud, [name], MD  B Complex-C (SUPER B COMPLEX PO) Take 1 tablet by mouth daily.    Yes [provider]  Blood Glucose Monitoring Suppl (ACCU-CHEK AVIVA PLUS) w/Device KIT Check blood sugar BID 11/01/20  Yes Masoud, [name], MD  budesonide-formoterol (SYMBICORT) 160-4.5 MCG/ACT inhaler Inhale 2 puffs into the lungs 2 (two) times daily. 11/01/20  Yes Masoud, [name], MD  cyclobenzaprine (FLEXERIL) 10 MG tablet Take 10 mg by mouth at bedtime as needed for muscle spasms.   Yes [provider]  fluticasone (FLONASE) 50 MCG/ACT nasal spray Place 1 spray into both nostrils 2 (two) times a day.  11/04/18  Yes [provider]  furosemide (LASIX) 20 MG tablet TAKE 2 TABLETS EVERY DAY 12/12/20  Yes Masoud, [name], MD  gabapentin (NEURONTIN) 300 MG capsule Take 1 capsule (300 mg total) by mouth 3 (three) times daily. 09/13/20  Yes Masoud, [name], MD  glipiZIDE (GLUCOTROL XL) 5 MG 24 hr tablet TAKE 1 TABLET TWICE DAILY Patient taking differently: Take 5 mg by mouth 2 (two) times daily before a meal. 01/19/21   Yes Masoud, [name], MD  ipratropium-albuterol (DUONEB) 0.5-2.5 (3) MG/3ML SOLN Take 3 mLs by nebulization every 4 (four) hours as needed. Patient taking differently: Take 3 mLs by nebulization every 4 (four) hours as needed (shortness of breath or wheezing). 10/26/20  Yes Masoud, [name], MD  losartan-hydrochlorothiazide (HYZAAR) 100-25 MG tablet TAKE 1 TABLET EVERY DAY 05/19/20  Yes Masoud, [name], MD  metFORMIN (GLUCOPHAGE) 500 MG tablet TAKE 1 TABLET TWICE DAILY Patient taking differently: Take 500 mg by mouth 2 (two) times daily with a meal. 04/25/20  Yes Masoud, [name], MD  Omega-3 Fatty Acids (FISH OIL) 1000 MG CAPS Take 2 capsules by mouth 2 (two) times daily.    Yes [provider]  traZODone (DESYREL) 50 MG tablet TAKE 2 TABLETS EVERY DAY Patient  taking differently: Take 100 mg by mouth at bedtime. 07/28/20  Yes Masoud, [name], MD  vitamin C (ASCORBIC ACID) 500 MG tablet Take 500 mg by mouth daily.   Yes [provider]  levofloxacin (LEVAQUIN) 500 MG tablet Take 1 tablet (500 mg total) by mouth daily. Patient not taking: No sig reported 09/13/20   [name], MD  levofloxacin (LEVAQUIN) 500 MG tablet Take 1 tablet (500 mg total) by mouth daily. Patient not taking: No sig reported 10/26/20   [name], MD  methylPREDNISolone (MEDROL DOSEPAK) 4 MG TBPK tablet Medrol Dosepak as directed 4 mg Patient not taking: No sig reported 10/26/20   [name], MD    Scheduled Meds: . acetaZOLAMIDE  500 mg Oral Daily  . vitamin C  500 mg Oral Daily  . aspirin EC  81 mg Oral Daily  . atorvastatin  40 mg Oral Daily  . budesonide (PULMICORT) nebulizer solution  0.5 mg Nebulization BID  . Chlorhexidine Gluconate Cloth  6 each Topical Q0600  . enoxaparin (LOVENOX) injection  50 mg Subcutaneous Q24H  . fluticasone  1 spray Each Nare BID  . gabapentin  300 mg Oral TID  . insulin aspart  0-20 Units Subcutaneous TID AC & HS  . insulin glargine  10 Units Subcutaneous Daily  .  ipratropium-albuterol  3 mL Nebulization Q4H  . methylPREDNISolone (SOLU-MEDROL) injection  20 mg Intravenous Daily  . metolazone  2.5 mg Oral Daily  . metoprolol succinate  12.5 mg Oral Daily  . mometasone-formoterol  2 puff Inhalation BID  . omega-3 acid ethyl esters  2 g Oral BID  . sodium chloride flush  3 mL Intravenous Q12H  . traZODone  100 mg Oral QHS   Continuous Infusions: . sodium chloride    . cefTRIAXone (ROCEPHIN)  IV 2 g (01/31/21 1041)   PRN Meds:.sodium chloride, acetaminophen, clonazepam, cyclobenzaprine, guaiFENesin-dextromethorphan, morphine injection, ondansetron (ZOFRAN) IV, sodium chloride flush   Resolved Hospital Problem list     ASSESSMENT & PLAN   Acute Hypoxic Hypercapnic Respiratory Failure secondary to Acute Decompensated HFpEF, AECOPD, STREPTOCOCCUS AGALACTIAE Pneumonia, &  post COVID Pneumonitis/fibrosis -Supplemental O2 as needed to maintain O2 saturations 88 to 92% -BiPAP as needed -High risk for intubation -Follow intermittent ABG and chest x-ray as needed -Repeat CXR on 6/7 with new RUL infiltrate -Diuresis as BP and renal function permits ~ continue Diamox + Metolazone due to contraction alkalosis -IV Steroids, continue SoluMedrol 20 mg IV daily -Bronchodilators -Budesonide nebs -Sputum culture from 01/27/21 with STREPTOCOCCUS AGALACTIAE ~ continue Ceftriaxone   Acute on chronic Diastolic HFpEF  Hypertension Hx: HLD  -Continuous cardiac monitoring -Maintain MAP greater than 65 -Diuresis as BP and renal function permits ~ continue Diamox + Metalozone -Hold HCTZ, metoprolol. -Continue low dose Losartan -Atorvastatin 40 mg -Repeat 2D Echocardiogram shows Left ventricular ejection fraction, by estimation, is 50 to 55%.  Hypokinesis of septal wall. Left ventricular diastolic parameters are consistent with Grade II diastolic dysfunction (pseudonormalization).The right ventricular size is severely enlarged. -Cardiology following, appreciate  input  STREPTOCOCCUS AGALACTIAE Pneumonia -Monitor fever curve -Trend WBC's -Sputum culture on 6/3 with Strep Agalactiae -CXR on 6/7 with new RUL infiltrate -Continue Ceftriaxone (started on 6/6)  Contraction Metabolic Alkalosis, suspect due to IV Lasix~ improving -Monitor I&O's / urinary output -Follow BMP -Ensure adequate renal perfusion -Avoid nephrotoxic agents as able -Replace electrolytes as indicated -Continue Diamox 500 mg daily + Metolazone 2.5 mg daily   Acute Metabolic Encephalopathy due to Hypercapnia now improved -Off precedex -  Provide supportive care -BiPAP to treat Hypercapnia   Diabetes mellitus -CBGs -Sliding scale insulin & 10 units Lantus daily -Follow ICU hyper/hypoglycemia protocol -Hold home Glipizide     Best practice (right click and "Reselect all SmartList Selections" daily)  Diet:  Oral Pain/Anxiety/Delirium protocol (if indicated): No VAP protocol (if indicated): Not indicated DVT prophylaxis: LMWH GI prophylaxis: PPI Glucose control:  SSI Yes Central venous access:  N/A Arterial line:  N/A Foley:  Yes, and it is still needed Mobility:  OOB as tolerated  PT consulted: Yes Last date of multidisciplinary goals of care discussion [02/01/21] Code Status:  Limited Code (NO CPR) Disposition: Stepdown   Critical care time: 35 minutes     Darel Hong, AGACNP-BC [facility] Prefer epic messenger for cross cover needs If after hours, please call E-link

## 2021-02-02 ENCOUNTER — Ambulatory Visit: Payer: Medicare HMO | Admitting: Family

## 2021-02-02 LAB — CBC
HCT: 32.7 % — ABNORMAL LOW (ref 39.0–52.0)
Hemoglobin: 10.2 g/dL — ABNORMAL LOW (ref 13.0–17.0)
MCH: 29.9 pg (ref 26.0–34.0)
MCHC: 31.2 g/dL (ref 30.0–36.0)
MCV: 95.9 fL (ref 80.0–100.0)
Platelets: 190 10*3/uL (ref 150–400)
RBC: 3.41 MIL/uL — ABNORMAL LOW (ref 4.22–5.81)
RDW: 14.5 % (ref 11.5–15.5)
WBC: 9.6 10*3/uL (ref 4.0–10.5)
nRBC: 0 % (ref 0.0–0.2)

## 2021-02-02 LAB — GLUCOSE, CAPILLARY
Glucose-Capillary: 130 mg/dL — ABNORMAL HIGH (ref 70–99)
Glucose-Capillary: 259 mg/dL — ABNORMAL HIGH (ref 70–99)
Glucose-Capillary: 203 mg/dL — ABNORMAL HIGH (ref 70–99)
Glucose-Capillary: 268 mg/dL — ABNORMAL HIGH (ref 70–99)

## 2021-02-02 LAB — BASIC METABOLIC PANEL
Anion gap: 8 (ref 5–15)
BUN: 39 mg/dL — ABNORMAL HIGH (ref 8–23)
CO2: 33 mmol/L — ABNORMAL HIGH (ref 22–32)
Calcium: 8.7 mg/dL — ABNORMAL LOW (ref 8.9–10.3)
Chloride: 95 mmol/L — ABNORMAL LOW (ref 98–111)
Creatinine, Ser: 0.88 mg/dL (ref 0.61–1.24)
GFR, Estimated: >60 mL/min (ref >60)
Glucose, Bld: 124 mg/dL — ABNORMAL HIGH (ref 70–99)
Potassium: 4 mmol/L (ref 3.5–5.1)
Sodium: 136 mmol/L (ref 135–145)

## 2021-02-02 LAB — MAGNESIUM: Magnesium: 2.4 mg/dL (ref 1.7–2.4)

## 2021-02-02 LAB — PHOSPHORUS: Phosphorus: 4.2 mg/dL (ref 2.5–4.6)

## 2021-02-02 MED ORDER — PHENAZOPYRIDINE HCL 100 MG PO TABS
100.0000 mg | ORAL_TABLET | Freq: Three times a day (TID) | ORAL | Status: AC
  Administered 2021-02-02 (×3): 100 mg via ORAL
  Filled 2021-02-02 (×3): qty 1

## 2021-02-02 MED ORDER — CEFAZOLIN SODIUM-DEXTROSE 2-4 GM/100ML-% IV SOLN
2.0000 g | Freq: Three times a day (TID) | INTRAVENOUS | Status: AC
  Administered 2021-02-02 – 2021-02-06 (×11): 2 g via INTRAVENOUS
  Filled 2021-02-02 (×13): qty 100

## 2021-02-02 NOTE — Progress Notes (Signed)
Dr. Mortimer Fries notified that patient has been coughing and when he suctions himself bloody secretions noted on the tubing and yanker. He is on Lovenox and Aspirin.   MD ordered to discontinue aspirin for now. Patient and family (daughter and grand daughter) at bedside notified.   Patient otherwise had a great day. He got his bath this morning and has been sitting up in the chair with his heated HF nasal cannula which was wean down from this morning.   He ate 100% of breakfast lunch and dinner. Foley catheter discontinued and patient has been peeing without any issues.  Family has been at bedside the whole day and has been assisting the patient especially with ordering food.   He had BM and got to the bedside commode using the walker with 2 assist for safety.

## 2021-02-02 NOTE — Progress Notes (Signed)
NAME:  Julian Sparks, MRN:  [id number], DOB:  14-Mar-1946, LOS: 9 ADMISSION DATE:  01/24/2021, INITIAL CONSULTATION DATE: 01/27/2021 REFERRING MD: Max Sane MD, CHIEF COMPLAINT: Worsening SOB  Brief Patient Description  75 y.o. male with PMH as below presenting to the ED worsening shortness of breath from his baseline associated with bilateral lower extremity swelling as well as increased abdominal girth for about 2 days. Has generalized anasarca +hypoxia + chronic venous stasis with ulceration and was seen at the vascular clinic where he had on arrival placed bilaterally to both lower extremities. He was admitted to PCU with Severe ACUTE Hypoxic and Hypercapnic Respiratory Failure due to acute severe decompensated combined systloic and diastolic heart failure on BiPAP  Pertinent  Medical History    Chronic bronchitis (HCC)   COPD (chronic obstructive pulmonary disease) ([place])   Diabetes mellitus without complication ([place])   Diastolic dysfunction   Dyspnea   Chronic venous statis   Hypertension   Significant Hospital Events: Including procedures, antibiotic start and stop dates in addition to other pertinent events   5/31: Admitted for acute diastolic heart failure 6/1: Resp distress and mental status changes, placed on biPAP 6/2: Respiratory status improved, weaned off BiPAP to HFNC 50% FiO2 @ 60L 6/3: Overnight with agitation and unable to tolerate BiPAP with thick tan secretions, resulting in  hypoxia (O2 sats 60's) during coughing episode, transferred to [facility] ~ now weaned back to HFNC, add IV steroids for AECOPD 6/5: Tolerated BiPAP for 3 hours last night 6/6: Remains on HHFNC (currently at 87% FiO2),BMP concerning for Contraction Metabolic Alkalosis due to aggressive Diuresis with Lasix ~ will hold Lasix and give Diamox + Metolazone; Sputum culture with Streptococcus Agalactiae ~ will start Ceftriaxone  6/7: Placed on BiPAP this morning due to SOB, CXR with new RUL alveolar infiltrate  (placed on Rocephin yesterday and sputum cx with Streptococcus Agalactiae. Will decrease Diamox to 500 mg daily, continue Metolazone 2.5 mg daily 6/8: On BiPAP overnight (50% FiO2) and tolerating, plan for HHFNC during day, diuresing well and contraction alkalosis continues to improve with Diamox + metolazone 6/9: Ceftriaxone switched to Cefazolin, remains on HHFNC (currently @ 65% FiO2), diuresing well, Creatinine remains stable, foley catheter removed  Cultures:  01/24/2021: SARS-CoV-2 PCR>> negative 01/24/2021: Influenza PCR>> negative 01/27/2021: MRSA PCR>> negative 01/27/2021: Sputum>> STREPTOCOCCUS AGALACTIAE  Antimicrobials:  Ceftriaxone 6/6>>6/9 Cefazolin 6/9>>  Interim History / Subjective:  -No acute events reported overnight -Currently on 65% FiO2 via HHFNC and tolerating -Afebrile, hemodynamically stable -UOP 3.6 L last 24 hrs (-19L since admit); Creatinine stable at  0.88 -Ceftriaxone changed to Cefazolin (plan to complete a total of 7 day course of ABX) -Pt reports dyspnea on exertion (no SOB at rest currently), productive cough and mild wheezing -Pt very appreciative of having foley catheter removed   FiO2 (%):  [50 %-70 %] 65 %  OBJECTIVE   Blood pressure (!) 127/55, pulse 99, temperature 98.8 F (37.1 C), temperature source Oral, resp. rate 19, weight 96 kg, SpO2 94 %.    FiO2 (%):  [50 %-70 %] 65 %   Intake/Output Summary (Last 24 hours) at 02/02/2021 1118 Last data filed at 02/02/2021 0900 Gross per 24 hour  Intake 580 ml  Output 3600 ml  Net -3020 ml    Filed Weights   01/31/21 0240 02/01/21 0500 02/02/21 0442  Weight: 96 kg 95.5 kg 96 kg    Physical Examination: GENERAL: Acute on chronically ill-appearing male, sitting in recliner , on HHFNC, no  acute distress HEAD: Normocephalic, atraumatic.  EYES: Pupils equal, round, reactive to light.  No scleral icterus.  MOUTH: Moist mucosal membrane. NECK: Supple, positive JVD PULMONARY: Coarse breath sound  bilaterally with mild expiratory wheezing to bilateral bases, even, no accessory muscle use CARDIOVASCULAR: S1 and S2. Regular rate and rhythm. No murmurs, rubs, or gallops.  GASTROINTESTINAL: Soft, nontender, -distended. Positive bowel sounds.  MUSCULOSKELETAL: Trace edema bilateral lower extremity, no deformities, normal bulk and tone  NEUROLOGIC: Awake, alert and oriented x3, moves all extremities to command, no focal deficits, speech clear, pupils PERRLA SKIN: Limited exam- intact,warm,dry   Review of Systems: Positives in BOLD:  Gen: Denies fever, chills, weight change, fatigue, night sweats HEENT: Denies blurred vision, double vision, hearing loss, tinnitus, sinus congestion, rhinorrhea, sore throat, neck stiffness, dysphagia PULM: Denies shortness of breath, Dyspnea on exertion, cough, sputum production, hemoptysis, wheezing CV: Denies chest pain, edema, orthopnea, paroxysmal nocturnal dyspnea, palpitations GI: Denies abdominal pain, nausea, vomiting, diarrhea, hematochezia, melena, constipation, change in bowel habits GU: Denies dysuria, hematuria, polyuria, oliguria, urethral discharge Endocrine: Denies hot or cold intolerance, polyuria, polyphagia or appetite change Derm: Denies rash, dry skin, scaling or peeling skin change Heme: Denies easy bruising, bleeding, bleeding gums Neuro: Denies headache, numbness, weakness, slurred speech, loss of memory or consciousness   Labs/imaging that I havepersonally reviewed  (right click and "Reselect all SmartList Selections" daily)  Labs 02/02/2021: Cl 95, Bicarb 33, glucose 124, BUN 39, Cr. 0.88, WBC 9.6, Hgb 10.2,  Hct 36.3  Chest x-ray 01/31/2021:Mediastinum and hilar structures normal. Cardiomegaly again noted. New prominent right upper lobe alveolar infiltrate/edema. Persistent bilateral interstitial prominence consistent with interstitial edema and or pneumonitis. Tiny right pleural effusion. No pneumothorax Venous ultrasound bilateral  lower extremities 6/2>> negative  Labs   CBC: Recent Labs  Lab 01/29/21 0446 01/30/21 0651 01/31/21 0425 02/01/21 0439 02/02/21 0501  WBC 13.1* 11.2* 11.8* 9.8 9.6  HGB 11.3* 11.3* 11.1* 11.0* 10.2*  HCT 37.0* 37.2* 37.8* 36.3* 32.7*  MCV 98.1 99.2 101.1* 99.2 95.9  PLT 231 224 227 197 190     Basic Metabolic Panel: Recent Labs  Lab 01/29/21 0446 01/30/21 0651 01/31/21 0425 02/01/21 0439 02/02/21 0501  NA 138 140 137 138 136  K 4.7 4.9 4.3 4.1 4.0  CL 91* 88* 94* 94* 95*  CO2 36* 41* 38* 34* 33*  GLUCOSE 225* 209* 114* 126* 124*  BUN 46* 41* 40* 39* 39*  CREATININE 0.94 0.89 0.79 0.86 0.88  CALCIUM 8.6* 9.0 9.1 9.0 8.7*  MG 2.0 2.3 2.2 2.0 2.4  PHOS 3.6 3.4 3.4 3.8 4.2    GFR: Estimated Creatinine Clearance: 77.5 mL/min (by C-G formula based on SCr of 0.88 mg/dL). Recent Labs  Lab 01/30/21 0651 01/31/21 0425 02/01/21 0439 02/02/21 0501  WBC 11.2* 11.8* 9.8 9.6     Liver Function Tests: No results for input(s): AST, ALT, ALKPHOS, BILITOT, PROT, ALBUMIN in the last 168 hours. No results for input(s): LIPASE, AMYLASE in the last 168 hours. No results for input(s): AMMONIA in the last 168 hours.  ABG    Component Value Date/Time   PHART 7.34 (L) 01/27/2021 0115   PCO2ART 77 (HH) 01/27/2021 0115   PO2ART 72 (L) 01/27/2021 0115   HCO3 41.5 (H) 01/27/2021 0115   O2SAT 93.3 01/27/2021 0115     Coagulation Profile: No results for input(s): INR, PROTIME in the last 168 hours.  Cardiac Enzymes: No results for input(s): CKTOTAL, CKMB, CKMBINDEX, TROPONINI in the last 168 hours.  HbA1C: Hgb A1c MFr Bld  Date/Time Value Ref Range Status  01/24/2021 11:32 AM 6.8 (H) 4.8 - 5.6 % Final    Comment:    (NOTE)         Prediabetes: 5.7 - 6.4         Diabetes: >6.4         Glycemic control for adults with diabetes: <7.0   02/05/2019 02:07 AM 7.3 (H) 4.8 - 5.6 % Final    Comment:    (NOTE) Pre diabetes:          5.7%-6.4% Diabetes:               >6.4% Glycemic control for   <7.0% adults with diabetes     CBG: Recent Labs  Lab 02/01/21 0804 02/01/21 1119 02/01/21 1515 02/01/21 2113 02/02/21 0740  GLUCAP 128* 213* 312* 276* 130*     Allergies Allergies  Allergen Reactions   Aspirin Nausea Only and Other (See Comments)    325 mg aspirin cause stomach upset   Penicillins Rash     Home Medications  Prior to Admission medications   Medication Sig Start Date End Date Taking? Authorizing Provider  ACCU-CHEK AVIVA PLUS test strip TEST BLOOD SUGAR TWICE DAILY 11/01/20  Yes Masoud, [name], MD  Accu-Chek Softclix Lancets lancets Check blood sugar two times daily 11/01/20  Yes Masoud, [name], MD  albuterol (VENTOLIN HFA) 108 (90 Base) MCG/ACT inhaler Inhale 2 puffs into the lungs as needed for wheezing or shortness of breath. 11/09/20  Yes Masoud, [name], MD  Alcohol Swabs (B-D SINGLE USE SWABS REGULAR) PADS USE AS DIRECTED 12/27/20  Yes Masoud, [name], MD  aspirin 81 MG EC tablet Take 81 mg by mouth daily. Swallow whole.    Yes [provider]  atorvastatin (LIPITOR) 40 MG tablet TAKE 1 TABLET EVERY DAY 05/05/20  Yes Masoud, [name], MD  B Complex-C (SUPER B COMPLEX PO) Take 1 tablet by mouth daily.    Yes [provider]  Blood Glucose Monitoring Suppl (ACCU-CHEK AVIVA PLUS) w/Device KIT Check blood sugar BID 11/01/20  Yes Masoud, [name], MD  budesonide-formoterol (SYMBICORT) 160-4.5 MCG/ACT inhaler Inhale 2 puffs into the lungs 2 (two) times daily. 11/01/20  Yes Masoud, [name], MD  cyclobenzaprine (FLEXERIL) 10 MG tablet Take 10 mg by mouth at bedtime as needed for muscle spasms.   Yes [provider]  fluticasone (FLONASE) 50 MCG/ACT nasal spray Place 1 spray into both nostrils 2 (two) times a day.  11/04/18  Yes [provider]  furosemide (LASIX) 20 MG tablet TAKE 2 TABLETS EVERY DAY 12/12/20  Yes Masoud, [name], MD  gabapentin (NEURONTIN) 300 MG capsule Take 1 capsule (300 mg total) by mouth 3 (three) times  daily. 09/13/20  Yes Masoud, [name], MD  glipiZIDE (GLUCOTROL XL) 5 MG 24 hr tablet TAKE 1 TABLET TWICE DAILY Patient taking differently: Take 5 mg by mouth 2 (two) times daily before a meal. 01/19/21  Yes Masoud, [name], MD  ipratropium-albuterol (DUONEB) 0.5-2.5 (3) MG/3ML SOLN Take 3 mLs by nebulization every 4 (four) hours as needed. Patient taking differently: Take 3 mLs by nebulization every 4 (four) hours as needed (shortness of breath or wheezing). 10/26/20  Yes Masoud, [name], MD  losartan-hydrochlorothiazide (HYZAAR) 100-25 MG tablet TAKE 1 TABLET EVERY DAY 05/19/20  Yes Masoud, [name], MD  metFORMIN (GLUCOPHAGE) 500 MG tablet TAKE 1 TABLET TWICE DAILY Patient taking differently: Take 500 mg by mouth 2 (two) times daily with a meal. 04/25/20  Yes Masoud,  [name], MD  Omega-3 Fatty Acids (FISH OIL) 1000 MG CAPS Take 2 capsules by mouth 2 (two) times daily.    Yes [provider]  traZODone (DESYREL) 50 MG tablet TAKE 2 TABLETS EVERY DAY Patient taking differently: Take 100 mg by mouth at bedtime. 07/28/20  Yes Masoud, [name], MD  vitamin C (ASCORBIC ACID) 500 MG tablet Take 500 mg by mouth daily.   Yes [provider]  levofloxacin (LEVAQUIN) 500 MG tablet Take 1 tablet (500 mg total) by mouth daily. Patient not taking: No sig reported 09/13/20   [name], MD  levofloxacin (LEVAQUIN) 500 MG tablet Take 1 tablet (500 mg total) by mouth daily. Patient not taking: No sig reported 10/26/20   [name], MD  methylPREDNISolone (MEDROL DOSEPAK) 4 MG TBPK tablet Medrol Dosepak as directed 4 mg Patient not taking: No sig reported 10/26/20   [name], MD    Scheduled Meds:  acetaZOLAMIDE  500 mg Oral Daily   vitamin C  500 mg Oral Daily   aspirin EC  81 mg Oral Daily   atorvastatin  40 mg Oral Daily   budesonide (PULMICORT) nebulizer solution  0.5 mg Nebulization BID   Chlorhexidine Gluconate Cloth  6 each Topical Q0600   enoxaparin (LOVENOX) injection  50 mg Subcutaneous Q24H    fluticasone  1 spray Each Nare BID   gabapentin  300 mg Oral TID   insulin aspart  0-20 Units Subcutaneous TID AC & HS   insulin aspart  3 Units Subcutaneous TID WC   insulin glargine  10 Units Subcutaneous Daily   ipratropium-albuterol  3 mL Nebulization Q4H   methylPREDNISolone (SOLU-MEDROL) injection  20 mg Intravenous Daily   metolazone  2.5 mg Oral Daily   metoprolol succinate  12.5 mg Oral Daily   mometasone-formoterol  2 puff Inhalation BID   omega-3 acid ethyl esters  2 g Oral BID   phenazopyridine  100 mg Oral TID WC   sodium chloride flush  3 mL Intravenous Q12H   traZODone  100 mg Oral QHS   Continuous Infusions:  sodium chloride      ceFAZolin (ANCEF) IV     PRN Meds:.sodium chloride, acetaminophen, clonazepam, cyclobenzaprine, guaiFENesin-dextromethorphan, morphine injection, ondansetron (ZOFRAN) IV, sodium chloride flush   Resolved Hospital Problem list     ASSESSMENT & PLAN   Acute Hypoxic Hypercapnic Respiratory Failure secondary to Acute Decompensated HFpEF, AECOPD, STREPTOCOCCUS AGALACTIAE Pneumonia, &  post COVID Pneumonitis/fibrosis -Supplemental O2 as needed to maintain O2 saturations 88 to 92% -BiPAP as needed -Follow intermittent ABG and chest x-ray as needed -Repeat CXR on 6/7 with new RUL infiltrate -Diuresis as BP and renal function permits ~ continue Diamox + Metolazone due to contraction alkalosis -IV Steroids, continue SoluMedrol 20 mg IV daily -Bronchodilators -Budesonide nebs -Sputum culture from 01/27/21 with STREPTOCOCCUS AGALACTIAE ~ ABX changed to Cefazolin   Acute on chronic Diastolic HFpEF  Hypertension Hx: HLD  -Continuous cardiac monitoring -Maintain MAP greater than 65 -Diuresis as BP and renal function permits ~ continue Diamox + Metalozone -Hold HCTZ, metoprolol. -Continue low dose Losartan -Atorvastatin 40 mg -Repeat 2D Echocardiogram shows Left ventricular ejection fraction, by estimation, is 50 to 55%.  Hypokinesis of septal  wall. Left ventricular diastolic parameters are consistent with Grade II diastolic dysfunction (pseudonormalization).The right ventricular size is severely enlarged. -Cardiology following, appreciate input  STREPTOCOCCUS AGALACTIAE Pneumonia -Monitor fever curve -Trend WBC's -Sputum culture on 6/3 with Strep Agalactiae -CXR on 6/7 with new RUL infiltrate -ABX changed to  Cefazolin (plan for 7 day course total of ABX, to complete on 02/06/21)  Contraction Metabolic Alkalosis, suspect due to IV Lasix~ improving -Monitor I&O's / urinary output -Follow BMP -Ensure adequate renal perfusion -Avoid nephrotoxic agents as able -Replace electrolytes as indicated -Continue Diamox 500 mg daily + Metolazone 2.5 mg daily   Acute Metabolic Encephalopathy due to Hypercapnia now improved -Off precedex -Provide supportive care -BiPAP to treat Hypercapnia   Diabetes mellitus -CBGs -Sliding scale insulin & 10 units Lantus daily -Follow ICU hyper/hypoglycemia protocol -Hold home Glipizide     Best practice (right click and "Reselect all SmartList Selections" daily)  Diet:  Oral Pain/Anxiety/Delirium protocol (if indicated): No VAP protocol (if indicated): Not indicated DVT prophylaxis: LMWH GI prophylaxis: PPI Glucose control:  SSI Yes Central venous access:  N/A Arterial line:  N/A Foley:  Discontinued Mobility:  OOB as tolerated  PT consulted: Yes Last date of multidisciplinary goals of care discussion [02/02/21] Code Status:  Limited Code (NO CPR) Disposition: Stepdown  Updated pt's daughter and granddaughter at bedside 02/02/21.  All questions answered.  Critical care time: 35 minutes     Darel Hong, AGACNP-BC [facility] Prefer epic messenger for cross cover needs If after hours, please call E-link

## 2021-02-02 NOTE — Progress Notes (Signed)
Progress Note  Patient Name: Julian Sparks Date of Encounter: 02/02/2021  [facility] Cardiologist: Dr. Rockey Situ  Subjective   He reports improvement in shortness of breath.  However, he continues to be on high flow nasal cannula at 65%.  He is negative at least 23 L for the admission and his weight is down 9 kg.  Inpatient Medications    Scheduled Meds:  acetaZOLAMIDE  500 mg Oral Daily   vitamin C  500 mg Oral Daily   aspirin EC  81 mg Oral Daily   atorvastatin  40 mg Oral Daily   budesonide (PULMICORT) nebulizer solution  0.5 mg Nebulization BID   Chlorhexidine Gluconate Cloth  6 each Topical Q0600   enoxaparin (LOVENOX) injection  50 mg Subcutaneous Q24H   fluticasone  1 spray Each Nare BID   gabapentin  300 mg Oral TID   insulin aspart  0-20 Units Subcutaneous TID AC & HS   insulin aspart  3 Units Subcutaneous TID WC   insulin glargine  10 Units Subcutaneous Daily   ipratropium-albuterol  3 mL Nebulization Q4H   methylPREDNISolone (SOLU-MEDROL) injection  20 mg Intravenous Daily   metolazone  2.5 mg Oral Daily   metoprolol succinate  12.5 mg Oral Daily   mometasone-formoterol  2 puff Inhalation BID   omega-3 acid ethyl esters  2 g Oral BID   phenazopyridine  100 mg Oral TID WC   sodium chloride flush  3 mL Intravenous Q12H   traZODone  100 mg Oral QHS   Continuous Infusions:  sodium chloride      ceFAZolin (ANCEF) IV     PRN Meds: sodium chloride, acetaminophen, clonazepam, cyclobenzaprine, guaiFENesin-dextromethorphan, morphine injection, ondansetron (ZOFRAN) IV, sodium chloride flush   Vital Signs    Vitals:   02/02/21 0700 02/02/21 0800 02/02/21 0820 02/02/21 1100  BP: (!) 111/57  (!) 113/59 (!) 127/55  Pulse: (!) 39 86 75 99  Resp: (!) 22 19 (!) 27 19  Temp:      TempSrc:      SpO2: 98% 98% 97% 94%  Weight:        Intake/Output Summary (Last 24 hours) at 02/02/2021 1302 Last data filed at 02/02/2021 1100 Gross per 24 hour  Intake 360 ml  Output 3800  ml  Net -3440 ml    Last 3 Weights 02/02/2021 02/01/2021 01/31/2021  Weight (lbs) 211 lb 10.3 oz 210 lb 8.6 oz 211 lb 10.3 oz  Weight (kg) 96 kg 95.5 kg 96 kg      Telemetry    Sinus rhythm- Personally Reviewed  ECG    No new tracing- Personally Reviewed  Physical Exam   GEN:  Mild respiratory distress Neck: No JVD Cardiac: RRR Respiratory:  Rhonchorous breath sounds throughout GI: Soft, nontender, non-distended  MS: Trace edema; No deformity. Neuro:  Nonfocal  Psych: Normal affect   Labs    High Sensitivity Troponin:   Recent Labs  Lab 01/24/21 1132  TROPONINIHS 13       Chemistry Recent Labs  Lab 01/31/21 0425 02/01/21 0439 02/02/21 0501  NA 137 138 136  K 4.3 4.1 4.0  CL 94* 94* 95*  CO2 38* 34* 33*  GLUCOSE 114* 126* 124*  BUN 40* 39* 39*  CREATININE 0.79 0.86 0.88  CALCIUM 9.1 9.0 8.7*  GFRNONAA >60 >60 >60  ANIONGAP 5 10 8       Hematology Recent Labs  Lab 01/31/21 0425 02/01/21 0439 02/02/21 0501  WBC 11.8* 9.8 9.6  RBC 3.74* 3.66* 3.41*  HGB 11.1* 11.0* 10.2*  HCT 37.8* 36.3* 32.7*  MCV 101.1* 99.2 95.9  MCH 29.7 30.1 29.9  MCHC 29.4* 30.3 31.2  RDW 14.6 14.7 14.5  PLT 227 197 190     BNPNo results for input(s): BNP, PROBNP in the last 168 hours.   DDimer No results for input(s): DDIMER in the last 168 hours.   Radiology    No results found.   Cardiac Studies   Echo 12/2020 Reviewed, EF 55 to 60%  Patient Profile     75 y.o. male with history of HFpEF, COPD, hypoxic respiratory failure presenting with shortness of breath, diagnosed with hypoxia and hypercapnic respiratory failure.  Being seen for HFpEF.  Assessment & Plan    1.  HFpEF -Last EF 55% -Net -2 L over the past 24 hours. The patient's symptoms manifested mostly by right-sided heart failure with suspected pulmonary hypertension.  He was switched from IV furosemide to Diamox given significantly elevated CO2. He has been diuresing reasonably well with Diamox and  metolazone. Suspect that his continued hypoxia is likely due to underlying lung disease  -Consider right heart cath when respiratory function improves to assess volume status.  2.  COPD, hypercapnic respiratory failure -BiPAP -Management as per pulmonary medicine, critical care team  3. hld lipitor  Total encounter time 35 minutes  Greater than 50% was spent in counseling and coordination of care with the patient      Signed, Kathlyn Sacramento, MD  02/02/2021, 1:02 PM

## 2021-02-03 ENCOUNTER — Inpatient Hospital Stay: Payer: Medicare HMO

## 2021-02-03 ENCOUNTER — Encounter: Payer: Self-pay | Admitting: Internal Medicine

## 2021-02-03 DIAGNOSIS — C8300 Small cell B-cell lymphoma, unspecified site: Secondary | ICD-10-CM

## 2021-02-03 DIAGNOSIS — J9622 Acute and chronic respiratory failure with hypercapnia: Secondary | ICD-10-CM

## 2021-02-03 LAB — SEDIMENTATION RATE: Sed Rate: 20 mm/hr (ref 0–20)

## 2021-02-03 LAB — CBC
HCT: 32 % — ABNORMAL LOW (ref 39.0–52.0)
Hemoglobin: 9.7 g/dL — ABNORMAL LOW (ref 13.0–17.0)
MCH: 29.8 pg (ref 26.0–34.0)
MCHC: 30.3 g/dL (ref 30.0–36.0)
MCV: 98.2 fL (ref 80.0–100.0)
Platelets: 202 10*3/uL (ref 150–400)
RBC: 3.26 MIL/uL — ABNORMAL LOW (ref 4.22–5.81)
RDW: 14.6 % (ref 11.5–15.5)
WBC: 10.1 10*3/uL (ref 4.0–10.5)
nRBC: 0 % (ref 0.0–0.2)

## 2021-02-03 LAB — BASIC METABOLIC PANEL
Anion gap: 7 (ref 5–15)
BUN: 44 mg/dL — ABNORMAL HIGH (ref 8–23)
CO2: 33 mmol/L — ABNORMAL HIGH (ref 22–32)
Calcium: 8.6 mg/dL — ABNORMAL LOW (ref 8.9–10.3)
Chloride: 98 mmol/L (ref 98–111)
Creatinine, Ser: 1.12 mg/dL (ref 0.61–1.24)
GFR, Estimated: >60 mL/min (ref >60)
Glucose, Bld: 195 mg/dL — ABNORMAL HIGH (ref 70–99)
Potassium: 3.8 mmol/L (ref 3.5–5.1)
Sodium: 138 mmol/L (ref 135–145)

## 2021-02-03 LAB — C-REACTIVE PROTEIN: CRP: 2.9 mg/dL — ABNORMAL HIGH (ref <1.0)

## 2021-02-03 LAB — GLUCOSE, CAPILLARY
Glucose-Capillary: 176 mg/dL — ABNORMAL HIGH (ref 70–99)
Glucose-Capillary: 189 mg/dL — ABNORMAL HIGH (ref 70–99)
Glucose-Capillary: 312 mg/dL — ABNORMAL HIGH (ref 70–99)
Glucose-Capillary: 347 mg/dL — ABNORMAL HIGH (ref 70–99)
Glucose-Capillary: 282 mg/dL — ABNORMAL HIGH (ref 70–99)

## 2021-02-03 LAB — HEMOGLOBIN AND HEMATOCRIT, BLOOD
HCT: 34.5 % — ABNORMAL LOW (ref 39.0–52.0)
Hemoglobin: 10.4 g/dL — ABNORMAL LOW (ref 13.0–17.0)

## 2021-02-03 LAB — BRAIN NATRIURETIC PEPTIDE: B Natriuretic Peptide: 163.9 pg/mL — ABNORMAL HIGH (ref 0.0–100.0)

## 2021-02-03 LAB — MAGNESIUM: Magnesium: 2.1 mg/dL (ref 1.7–2.4)

## 2021-02-03 LAB — PHOSPHORUS: Phosphorus: 4.3 mg/dL (ref 2.5–4.6)

## 2021-02-03 LAB — PROTIME-INR
INR: 1.1 (ref 0.8–1.2)
Prothrombin Time: 13.7 seconds (ref 11.4–15.2)

## 2021-02-03 LAB — APTT: aPTT: 30 seconds (ref 24–36)

## 2021-02-03 MED ORDER — IOHEXOL 300 MG/ML  SOLN
100.0000 mL | Freq: Once | INTRAMUSCULAR | Status: AC | PRN
  Administered 2021-02-03: 100 mL via INTRAVENOUS

## 2021-02-03 MED ORDER — METHYLPREDNISOLONE SODIUM SUCC 125 MG IJ SOLR
80.0000 mg | Freq: Three times a day (TID) | INTRAMUSCULAR | Status: DC
  Administered 2021-02-03 – 2021-02-05 (×5): 80 mg via INTRAVENOUS
  Filled 2021-02-03 (×5): qty 2

## 2021-02-03 MED ORDER — MELATONIN 5 MG PO TABS
5.0000 mg | ORAL_TABLET | Freq: Every day | ORAL | Status: DC
  Administered 2021-02-03 – 2021-02-13 (×11): 5 mg via ORAL
  Filled 2021-02-03 (×11): qty 1

## 2021-02-03 MED ORDER — IOHEXOL 9 MG/ML PO SOLN
500.0000 mL | ORAL | Status: AC
  Administered 2021-02-03 (×2): 500 mL via ORAL

## 2021-02-03 NOTE — TOC Progression Note (Signed)
Transition of Care [facility]) - Progression Note    Patient Details  Name: Julian Sparks MRN: [id number] Date of Birth: 1946-02-01  Transition of Care [facility]) CM/SW [place], [place] Phone Number: 02/03/2021, 5:28 PM  Clinical Narrative:      Patient alert and oriented. Patient remains critically ill.  CT chest concerning for R>L multifocal opacity (consistent with pnuemonia vs. PAH), along with progression of previous abdominal adenopathy (consistent with Lymphoma); Oncology consulted      Expected Discharge Plan and Services                                                 Social Determinants of Health (SDOH) Interventions    Readmission Risk Interventions No flowsheet data found.

## 2021-02-03 NOTE — Progress Notes (Signed)
HFNC settings increased to 50L & 80% due to desat while pt sleeping on his side.

## 2021-02-03 NOTE — Progress Notes (Signed)
NAME:  Julian Sparks, MRN:  [id number], DOB:  [birth year], LOS: 10 ADMISSION DATE:  01/24/2021, INITIAL CONSULTATION DATE: 01/27/2021 REFERRING MD: Max Sane MD, CHIEF COMPLAINT: Worsening SOB  Brief Patient Description  75 y.o. male with PMH as below presenting to the ED worsening shortness of breath from his baseline associated with bilateral lower extremity swelling as well as increased abdominal girth for about 2 days. Has generalized anasarca +hypoxia + chronic venous stasis with ulceration and was seen at the vascular clinic where he had on arrival placed bilaterally to both lower extremities. He was admitted to PCU with Severe ACUTE Hypoxic and Hypercapnic Respiratory Failure due to acute severe decompensated combined systloic and diastolic heart failure on BiPAP  Pertinent  Medical History    Chronic bronchitis (HCC)   COPD (chronic obstructive pulmonary disease) ([place])   Diabetes mellitus without complication ([place])   Diastolic dysfunction   Dyspnea   Chronic venous statis   Hypertension   Significant Hospital Events: Including procedures, antibiotic start and stop dates in addition to other pertinent events   5/31: Admitted for acute diastolic heart failure 6/1: Resp distress and mental status changes, placed on biPAP 6/2: Respiratory status improved, weaned off BiPAP to HFNC 50% FiO2 @ 60L 6/3: Overnight with agitation and unable to tolerate BiPAP with thick tan secretions, resulting in  hypoxia (O2 sats 60's) during coughing episode, transferred to [facility] ~ now weaned back to HFNC, add IV steroids for AECOPD 6/5: Tolerated BiPAP for 3 hours last night 6/6: Remains on HHFNC (currently at 87% FiO2),BMP concerning for Contraction Metabolic Alkalosis due to aggressive Diuresis with Lasix ~ will hold Lasix and give Diamox + Metolazone; Sputum culture with Streptococcus Agalactiae ~ will start Ceftriaxone  6/7: Placed on BiPAP this morning due to SOB, CXR with new RUL alveolar  infiltrate (placed on Rocephin yesterday and sputum cx with Streptococcus Agalactiae. Will decrease Diamox to 500 mg daily, continue Metolazone 2.5 mg daily 6/8: On BiPAP overnight (50% FiO2) and tolerating, plan for HHFNC during day, diuresing well and contraction alkalosis continues to improve with Diamox + metolazone 6/9: Ceftriaxone switched to Cefazolin, remains on HHFNC (currently @ 65% FiO2), diuresing well, Creatinine remains stable, foley catheter removed 6/10: CT chest concerning for R>L multifocal opacity (consistent with pnuemonia vs. PAH), along with progression of previous abdominal adenopathy (consistent with Lymphoma); Oncology consulted, Holding diuresis due to soft BP and slight increase in serum Creatinine, increase steroids  Cultures:  01/24/2021: SARS-CoV-2 PCR>> negative 01/24/2021: Influenza PCR>> negative 01/27/2021: MRSA PCR>> negative 01/27/2021: Sputum>> STREPTOCOCCUS AGALACTIAE  Antimicrobials:  Ceftriaxone 6/6>>6/9 Cefazolin 6/9>>  Interim History / Subjective:  -Overnight required increase in FiO2 to 80% while sleeping ~ weaned back to 60% currently -CXR this morning concerning for worsening of Pneumonia ~ CT Chest ordered~ CT chest concerning for R>L multifocal opacity (consistent with pnuemonia vs PAH), along with progression of previous abdominal adenopathy (consistent with Lymphoma) -Oncology consulted -Afebrile, BP soft this morning -UOP 3.3 L last 24 hrs (-21L since admit) -Will hold Diuresis today due to soft BP and slight increase in Cr. To 1.12 (0.88 yesterday) -Pt denies SOB, chest pain, Abdominal pain, N/V/D, fever, chills -Does report wheezing and cough, previous hemoptysis yesterday evening but no episodes so far this morning   FiO2 (%):  [60 %-80 %] 60 %  OBJECTIVE   Blood pressure (!) 119/55, pulse 80, temperature (!) 97.5 F (36.4 C), temperature source Axillary, resp. rate (!) 32, weight 93.1 kg, SpO2 97 %.  FiO2 (%):  [60 %-80 %] 60 %    Intake/Output Summary (Last 24 hours) at 02/03/2021 0941 Last data filed at 02/03/2021 9323 Gross per 24 hour  Intake 1457.07 ml  Output 2725 ml  Net -1267.93 ml    Filed Weights   02/01/21 0500 02/02/21 0442 02/03/21 0500  Weight: 95.5 kg 96 kg 93.1 kg    Physical Examination: GENERAL: Acute on chronically ill-appearing male, laying in bed , on HHFNC, no acute distress HEAD: Normocephalic, atraumatic.  EYES: Pupils equal, round, reactive to light.  No scleral icterus.  MOUTH: Moist mucosal membrane. NECK: Supple, positive JVD PULMONARY: Coarse breath sound bilaterally, no wheezing or rales, even, no accessory muscle use CARDIOVASCULAR: S1 and S2. Regular rate and rhythm. No murmurs, rubs, or gallops.  GASTROINTESTINAL: Soft, nontender, -distended. Positive bowel sounds.  MUSCULOSKELETAL: Trace edema bilateral lower extremity, no deformities, normal bulk and tone  NEUROLOGIC: Awake, alert and oriented x3, moves all extremities to command, no focal deficits, speech clear, pupils PERRLA SKIN: Limited exam- intact,warm,dry   Review of Systems: Positives in BOLD:  Gen: Denies fever, chills, weight change, fatigue, night sweats HEENT: Denies blurred vision, double vision, hearing loss, tinnitus, sinus congestion, rhinorrhea, sore throat, neck stiffness, dysphagia PULM: Denies shortness of breath, cough, sputum production, hemoptysis, wheezing CV: Denies chest pain, edema, orthopnea, paroxysmal nocturnal dyspnea, palpitations GI: Denies abdominal pain, nausea, vomiting, diarrhea, hematochezia, melena, constipation, change in bowel habits GU: Denies dysuria, hematuria, polyuria, oliguria, urethral discharge Endocrine: Denies hot or cold intolerance, polyuria, polyphagia or appetite change Derm: Denies rash, dry skin, scaling or peeling skin change Heme: Denies easy bruising, bleeding, bleeding gums Neuro: Denies headache, numbness, weakness, slurred speech, loss of memory or  consciousness   Labs/imaging that I havepersonally reviewed  (right click and "Reselect all SmartList Selections" daily)  Labs 02/03/2021: Cl 98, Bicarb 33, glucose 195, BUN 44, Cr. 1.12, WBC 10.1, Hgb 10.4,  Hct 34.5, BNP 163  CXR 6/10>>There is extensive airspace opacity in the right upper and mid lung regions, slightly increased. There is patchy airspace opacity in the left mid lung, stable, and in the left base, marginally increased. There is stable cardiomegaly with a degree of pulmonary venous hypertension. There is aortic atherosclerosis. No adenopathy no bone lesions. CT Chest w/o contrast 6/10>>1. Right greater than left multifocal airspace and ground-glass opacity, most consistent with pneumonia. 2. Tiny bilateral pleural effusions. 3. Progression of massive upper abdominal adenopathy,consistent with lymphoma. 4. Development of mild thoracic adenopathy which could be reactive or related to thoracic extension of lymphoma. 5. Aortic atherosclerosis (ICD10-I70.0), coronary artery atherosclerosis and emphysema (ICD10-J43.9). 6. Pulmonary artery enlargement suggests pulmonary arterial hypertension. 7. Interval T7 compression deformity.   Labs   CBC: Recent Labs  Lab 01/30/21 0651 01/31/21 0425 02/01/21 0439 02/02/21 0501 02/03/21 0346  WBC 11.2* 11.8* 9.8 9.6 10.1  HGB 11.3* 11.1* 11.0* 10.2* 9.7*  HCT 37.2* 37.8* 36.3* 32.7* 32.0*  MCV 99.2 101.1* 99.2 95.9 98.2  PLT 224 227 197 190 202     Basic Metabolic Panel: Recent Labs  Lab 01/30/21 0651 01/31/21 0425 02/01/21 0439 02/02/21 0501 02/03/21 0346  NA 140 137 138 136 138  K 4.9 4.3 4.1 4.0 3.8  CL 88* 94* 94* 95* 98  CO2 41* 38* 34* 33* 33*  GLUCOSE 209* 114* 126* 124* 195*  BUN 41* 40* 39* 39* 44*  CREATININE 0.89 0.79 0.86 0.88 1.12  CALCIUM 9.0 9.1 9.0 8.7* 8.6*  MG 2.3 2.2 2.0 2.4 2.1  PHOS 3.4 3.4 3.8 4.2 4.3    GFR: Estimated Creatinine Clearance: 59.9 mL/min (by C-G formula based on SCr of  1.12 mg/dL). Recent Labs  Lab 01/31/21 0425 02/01/21 0439 02/02/21 0501 02/03/21 0346  WBC 11.8* 9.8 9.6 10.1     Liver Function Tests: No results for input(s): AST, ALT, ALKPHOS, BILITOT, PROT, ALBUMIN in the last 168 hours. No results for input(s): LIPASE, AMYLASE in the last 168 hours. No results for input(s): AMMONIA in the last 168 hours.  ABG    Component Value Date/Time   PHART 7.34 (L) 01/27/2021 0115   PCO2ART 77 (HH) 01/27/2021 0115   PO2ART 72 (L) 01/27/2021 0115   HCO3 41.5 (H) 01/27/2021 0115   O2SAT 93.3 01/27/2021 0115     Coagulation Profile: Recent Labs  Lab 02/03/21 0527  INR 1.1    Cardiac Enzymes: No results for input(s): CKTOTAL, CKMB, CKMBINDEX, TROPONINI in the last 168 hours.  HbA1C: Hgb A1c MFr Bld  Date/Time Value Ref Range Status  01/24/2021 11:32 AM 6.8 (H) 4.8 - 5.6 % Final    Comment:    (NOTE)         Prediabetes: 5.7 - 6.4         Diabetes: >6.4         Glycemic control for adults with diabetes: <7.0   02/05/2019 02:07 AM 7.3 (H) 4.8 - 5.6 % Final    Comment:    (NOTE) Pre diabetes:          5.7%-6.4% Diabetes:              >6.4% Glycemic control for   <7.0% adults with diabetes     CBG: Recent Labs  Lab 02/02/21 0740 02/02/21 1126 02/02/21 1542 02/02/21 1910 02/03/21 0722  GLUCAP 130* 259* 203* 268* 176*     Allergies Allergies  Allergen Reactions   Aspirin Nausea Only and Other (See Comments)    325 mg aspirin cause stomach upset   Penicillins Rash     Home Medications  Prior to Admission medications   Medication Sig Start Date End Date Taking? Authorizing Provider  ACCU-CHEK AVIVA PLUS test strip TEST BLOOD SUGAR TWICE DAILY 11/01/20  Yes Masoud, [name], MD  Accu-Chek Softclix Lancets lancets Check blood sugar two times daily 11/01/20  Yes Masoud, [name], MD  albuterol (VENTOLIN HFA) 108 (90 Base) MCG/ACT inhaler Inhale 2 puffs into the lungs as needed for wheezing or shortness of breath. 11/09/20  Yes  Masoud, [name], MD  Alcohol Swabs (B-D SINGLE USE SWABS REGULAR) PADS USE AS DIRECTED 12/27/20  Yes Masoud, [name], MD  aspirin 81 MG EC tablet Take 81 mg by mouth daily. Swallow whole.    Yes [provider]  atorvastatin (LIPITOR) 40 MG tablet TAKE 1 TABLET EVERY DAY 05/05/20  Yes Masoud, [name], MD  B Complex-C (SUPER B COMPLEX PO) Take 1 tablet by mouth daily.    Yes [provider]  Blood Glucose Monitoring Suppl (ACCU-CHEK AVIVA PLUS) w/Device KIT Check blood sugar BID 11/01/20  Yes Masoud, [name], MD  budesonide-formoterol (SYMBICORT) 160-4.5 MCG/ACT inhaler Inhale 2 puffs into the lungs 2 (two) times daily. 11/01/20  Yes Masoud, [name], MD  cyclobenzaprine (FLEXERIL) 10 MG tablet Take 10 mg by mouth at bedtime as needed for muscle spasms.   Yes [provider]  fluticasone (FLONASE) 50 MCG/ACT nasal spray Place 1 spray into both nostrils 2 (two) times a day.  11/04/18  Yes [provider]  furosemide (LASIX) 20 MG  tablet TAKE 2 TABLETS EVERY DAY 12/12/20  Yes Masoud, [name], MD  gabapentin (NEURONTIN) 300 MG capsule Take 1 capsule (300 mg total) by mouth 3 (three) times daily. 09/13/20  Yes Masoud, [name], MD  glipiZIDE (GLUCOTROL XL) 5 MG 24 hr tablet TAKE 1 TABLET TWICE DAILY Patient taking differently: Take 5 mg by mouth 2 (two) times daily before a meal. 01/19/21  Yes Masoud, [name], MD  ipratropium-albuterol (DUONEB) 0.5-2.5 (3) MG/3ML SOLN Take 3 mLs by nebulization every 4 (four) hours as needed. Patient taking differently: Take 3 mLs by nebulization every 4 (four) hours as needed (shortness of breath or wheezing). 10/26/20  Yes Masoud, [name], MD  losartan-hydrochlorothiazide (HYZAAR) 100-25 MG tablet TAKE 1 TABLET EVERY DAY 05/19/20  Yes Masoud, [name], MD  metFORMIN (GLUCOPHAGE) 500 MG tablet TAKE 1 TABLET TWICE DAILY Patient taking differently: Take 500 mg by mouth 2 (two) times daily with a meal. 04/25/20  Yes Masoud, [name], MD  Omega-3 Fatty Acids (FISH OIL) 1000 MG  CAPS Take 2 capsules by mouth 2 (two) times daily.    Yes [provider]  traZODone (DESYREL) 50 MG tablet TAKE 2 TABLETS EVERY DAY Patient taking differently: Take 100 mg by mouth at bedtime. 07/28/20  Yes Masoud, [name], MD  vitamin C (ASCORBIC ACID) 500 MG tablet Take 500 mg by mouth daily.   Yes [provider]  levofloxacin (LEVAQUIN) 500 MG tablet Take 1 tablet (500 mg total) by mouth daily. Patient not taking: No sig reported 09/13/20   [name], MD  levofloxacin (LEVAQUIN) 500 MG tablet Take 1 tablet (500 mg total) by mouth daily. Patient not taking: No sig reported 10/26/20   [name], MD  methylPREDNISolone (MEDROL DOSEPAK) 4 MG TBPK tablet Medrol Dosepak as directed 4 mg Patient not taking: No sig reported 10/26/20   [name], MD    Scheduled Meds:  vitamin C  500 mg Oral Daily   atorvastatin  40 mg Oral Daily   budesonide (PULMICORT) nebulizer solution  0.5 mg Nebulization BID   Chlorhexidine Gluconate Cloth  6 each Topical Q0600   fluticasone  1 spray Each Nare BID   gabapentin  300 mg Oral TID   insulin aspart  0-20 Units Subcutaneous TID AC & HS   insulin aspart  3 Units Subcutaneous TID WC   insulin glargine  10 Units Subcutaneous Daily   ipratropium-albuterol  3 mL Nebulization Q4H   methylPREDNISolone (SOLU-MEDROL) injection  20 mg Intravenous Daily   metoprolol succinate  12.5 mg Oral Daily   mometasone-formoterol  2 puff Inhalation BID   omega-3 acid ethyl esters  2 g Oral BID   sodium chloride flush  3 mL Intravenous Q12H   traZODone  100 mg Oral QHS   Continuous Infusions:  sodium chloride      ceFAZolin (ANCEF) IV Stopped (02/03/21 0622)   PRN Meds:.sodium chloride, acetaminophen, clonazepam, cyclobenzaprine, guaiFENesin-dextromethorphan, morphine injection, ondansetron (ZOFRAN) IV, sodium chloride flush   Resolved Hospital Problem list     ASSESSMENT & PLAN   Acute Hypoxic Hypercapnic Respiratory Failure secondary to Acute  Decompensated HFpEF, AECOPD, STREPTOCOCCUS AGALACTIAE Pneumonia, &  post COVID Pneumonitis/fibrosis ? Pulmonary Alveolar Hemorrhage -Supplemental O2 as needed to maintain O2 saturations 88 to 92% -BiPAP as needed -Follow intermittent ABG and chest x-ray as needed -CT Chest 6/10 w/ R/L multifocal airspace opacity consistent with pneumonia ~ Discussed with Dr. Patsey Berthold, concern for possible Alveolar Hemorrhage given recent hemoptysis ~ Lovenox and ASA on hold -Diuresis as BP and  renal function permits ~ will hold Diuresis today due to soft BP -IV Steroids, increased SoluMedrol to 80 mg q8h per Dr. Patsey Berthold -Check ESR, CRP, RF, ANCA -Bronchodilators -Budesonide nebs -Sputum culture from 01/27/21 with STREPTOCOCCUS AGALACTIAE ~ Continue Cefazolin   Acute on chronic Diastolic HFpEF  Hypertension Hx: HLD  -Continuous cardiac monitoring -Maintain MAP greater than 65 -Diuresis as BP and renal function permits ~ hold diuresis today 02/03/21 due to soft BP and slight increase in Creatinine -Hold HCTZ, metoprolol. -Continue low dose Losartan -Atorvastatin 40 mg -Repeat 2D Echocardiogram shows Left ventricular ejection fraction, by estimation, is 50 to 55%.  Hypokinesis of septal wall. Left ventricular diastolic parameters are consistent with Grade II diastolic dysfunction (pseudonormalization).The right ventricular size is severely enlarged. -Cardiology following, appreciate input  STREPTOCOCCUS AGALACTIAE Pneumonia -Monitor fever curve -Trend WBC's -Sputum culture on 6/3 with Strep Agalactiae -Continue Cefazolin (plan for 7 day course total of ABX, to complete on 02/06/21)  Contraction Metabolic Alkalosis, suspect due to IV Lasix~ improving -Monitor I&O's / urinary output -Follow BMP -Ensure adequate renal perfusion -Avoid nephrotoxic agents as able -Replace electrolytes as indicated -HOLD Diamox 500 mg daily + Metolazone 2.5 mg daily today 02/03/21 due to soft BP and slight increase in serum  Creatinine  Abdominal Adenopathy (concern for Lymphoma) -Oncology consulted, appreciate input -Obtain CT Abdomen/Pelvis with contrast   Acute Metabolic Encephalopathy due to Hypercapnia now improved -Off precedex -Provide supportive care -BiPAP to treat Hypercapnia   Diabetes mellitus -CBGs -Sliding scale insulin & 10 units Lantus daily -Follow ICU hyper/hypoglycemia protocol -Hold home Glipizide     Best practice (right click and "Reselect all SmartList Selections" daily)  Diet:  Oral Pain/Anxiety/Delirium protocol (if indicated): No VAP protocol (if indicated): Not indicated DVT prophylaxis: Contraindicated due to hemoptysis  GI prophylaxis: PPI Glucose control:  SSI Yes Central venous access:  N/A Arterial line:  N/A Foley:  N/A Mobility:  OOB as tolerated  PT consulted: Yes Last date of multidisciplinary goals of care discussion [02/03/21] Code Status:  Limited Code (NO CPR) Disposition: Stepdown   Critical care time: 40 minutes     Darel Hong, AGACNP-BC [facility] Prefer epic messenger for cross cover needs If after hours, please call E-link

## 2021-02-03 NOTE — Progress Notes (Signed)
Pt's daughter Julian Sparks [phone]). Daughter states she was not aware of a password in place- and with the pt's verbal consent she was given the password to appropriately receive medical information on her father.    Julian Sparks was given a update on the pt's condition and had no further concerns that were posed to this RN.

## 2021-02-03 NOTE — Progress Notes (Signed)
Inpatient Diabetes Program Recommendations  AACE/ADA: New Consensus Statement on Inpatient Glycemic Control (2015)  Target Ranges:  Prepandial:   less than 140 mg/dL      Peak postprandial:   less than 180 mg/dL (1-2 hours)      Critically ill patients:  140 - 180 mg/dL   Lab Results  Component Value Date   GLUCAP 176 (H) 02/03/2021   HGBA1C 6.8 (H) 01/24/2021    Review of Glycemic Control Results for Julian Sparks, Julian Sparks (MRN [id number]) as of 02/03/2021 07:27  Ref. Range 02/02/2021 07:40 02/02/2021 11:26 02/02/2021 15:42 02/02/2021 19:10 02/03/2021 07:22  Glucose-Capillary Latest Ref Range: 70 - 99 mg/dL 130 (H) 259 (H) 203 (H) 268 (H) 176 (H)     Inpatient Diabetes Program Recommendations:    Increase Novolog meal coverage to 5 units TID with meals if consumes at least 50%.Will continue to follow while inpatient.  Thank you, Reche Dixon, RN, BSN Diabetes Coordinator Inpatient Diabetes Program [phone] (team pager from 8a-5p)

## 2021-02-03 NOTE — Progress Notes (Addendum)
Patient is alert and oriented x4 with some confusion noted upon waking. Answers all questions appropriately. Was initally confused about the time of day when awakened this morning. Respirations even and non-labored. Continues on heated high flow nasal cannula with rate increased to 50L/80% due to desaturation. He removes the cannula and desats into the 60's. He states that he doesn't know why he does that but the behavior has continued x3 or 4 times. Continent of bowel and bladder; 1162ml out. Order to leave legs wrapped but there are no wraps on his legs. Afebrile. Call bell in reach. Will continue to monitor. Patient also has had a moderate amount of bloody secretions from his mouth and was hypotensive overnight. BP now stable. Provider was notified.

## 2021-02-03 NOTE — Consult Note (Addendum)
[place] [facility] CONSULT NOTE  Patient Care Team: [name], MD as PCP - General (Internal Medicine) Sindy Guadeloupe, MD as Consulting Physician (Hematology and Oncology)  CHIEF COMPLAINTS/PURPOSE OF CONSULTATION: Small lymphocytic leukemia  HISTORY OF PRESENTING ILLNESS:  Julian Sparks 75 y.o.  male patient with multiple medical problems including COPD/CHF chronic respiratory failure on 2 L of oxygen /diabetes hypertension and also history of small lymphocytic leukemia is currently admitted to hospital for worsening shortness of breath.  Patient is currently in the ICU high flow nasal oxygen.  Patient has noted some improvement of his breathing while on steroids/diuretics/respiratory therapy.  Patient was diagnosed with small lymphocytic leukemia s/p abnormal biopsy; followed by Dr.Rao.  However given the absence of any progressive symptoms or bulky adenopathy patient is recommended surveillance.  Patient at this admission had a CT scan chest abdomen pelvis that showed progressive retroperitoneal adenopathy with the largest conglomerate of lymph nodes approximately 14 cm in size.  Patient denies any night sweats or fevers.   Review of Systems  Constitutional:  Positive for malaise/fatigue. Negative for chills, fever and weight loss.  HENT:  Negative for nosebleeds and sore throat.   Eyes:  Negative for double vision.  Respiratory:  Positive for cough and shortness of breath. Negative for hemoptysis, sputum production and wheezing.   Cardiovascular:  Negative for chest pain, palpitations, orthopnea and leg swelling.  Gastrointestinal:  Negative for abdominal pain, blood in stool, constipation, diarrhea, heartburn, melena, nausea and vomiting.  Genitourinary:  Negative for dysuria, frequency and urgency.  Musculoskeletal:  Negative for back pain and joint pain.  Skin: Negative.  Negative for itching and rash.  Neurological:  Negative for dizziness, tingling, focal weakness,  weakness and headaches.  Endo/Heme/Allergies:  Does not bruise/bleed easily.  Psychiatric/Behavioral:  Negative for depression. The patient is not nervous/anxious and does not have insomnia.     MEDICAL HISTORY:  Past Medical History:  Diagnosis Date   Chronic bronchitis (HCC)    COPD (chronic obstructive pulmonary disease) ([place])    Diabetes mellitus without complication (HCC)    Diastolic dysfunction    Dyspnea    with exertion   Hypertension     SURGICAL HISTORY: Past Surgical History:  Procedure Laterality Date   CIRCUMCISION     SHOULDER ARTHROSCOPY WITH OPEN ROTATOR CUFF REPAIR Left 06/20/2016   Procedure: SHOULDER ARTHROSCOPY WITH OPEN ROTATOR CUFF REPAIR;  Surgeon: Earnestine Leys, MD;  Location: [facility] ORS;  Service: Orthopedics;  Laterality: Left;   UPPER GI ENDOSCOPY      SOCIAL HISTORY: Social History   Socioeconomic History   Marital status: Widowed    Spouse name: Not on file   Number of children: 2   Years of education: Not on file   Highest education level: Not on file  Occupational History   Not on file  Tobacco Use   Smoking status: Former    Pack years: 0.00   Smokeless tobacco: Current    Types: Chew  Substance and Sexual Activity   Alcohol use: No   Drug use: No   Sexual activity: Not on file  Other Topics Concern   Not on file  Social History Narrative   Not on file   Social Determinants of Health   Financial Resource Strain: Not on file  Food Insecurity: Not on file  Transportation Needs: Not on file  Physical Activity: Not on file  Stress: Not on file  Social Connections: Not on file  Intimate Partner Violence: Not  on file    FAMILY HISTORY: Family History  Problem Relation Age of Onset   Hypertension Mother     ALLERGIES:  is allergic to aspirin and penicillins.  MEDICATIONS:  Current Facility-Administered Medications  Medication Dose Route Frequency Provider Last Rate Last Admin   0.9 %  sodium chloride infusion  250 mL  Intravenous PRN Agbata, Tochukwu, MD       acetaminophen (TYLENOL) tablet 650 mg  650 mg Oral Q4H PRN Agbata, Tochukwu, MD   650 mg at 02/02/21 2102   ascorbic acid (VITAMIN C) tablet 500 mg  500 mg Oral Daily Agbata, Tochukwu, MD   500 mg at 02/03/21 1006   atorvastatin (LIPITOR) tablet 40 mg  40 mg Oral Daily Agbata, Tochukwu, MD   40 mg at 02/03/21 1006   budesonide (PULMICORT) nebulizer solution 0.5 mg  0.5 mg Nebulization BID Mortimer Fries, Kurian, MD   0.5 mg at 02/03/21 2008   ceFAZolin (ANCEF) IVPB 2g/100 mL premix  2 g Intravenous Q8H Kasa, Maretta Bees, MD 200 mL/hr at 02/03/21 2134 2 g at 02/03/21 2134   Chlorhexidine Gluconate Cloth 2 % PADS 6 each  6 each Topical J2426 Flora Lipps, MD   6 each at 02/02/21 1042   clonazePAM (KLONOPIN) disintegrating tablet 0.25 mg  0.25 mg Oral BID PRN Flora Lipps, MD   0.25 mg at 02/03/21 1816   cyclobenzaprine (FLEXERIL) tablet 10 mg  10 mg Oral QHS PRN Agbata, Tochukwu, MD   10 mg at 01/26/21 1158   fluticasone (FLONASE) 50 MCG/ACT nasal spray 1 spray  1 spray Each Nare BID Agbata, Tochukwu, MD   1 spray at 02/03/21 2124   gabapentin (NEURONTIN) capsule 300 mg  300 mg Oral TID Agbata, Tochukwu, MD   300 mg at 02/03/21 2122   guaiFENesin-dextromethorphan (ROBITUSSIN DM) 100-10 MG/5ML syrup 5 mL  5 mL Oral Q4H PRN Max Sane, MD   5 mL at 02/01/21 2302   insulin aspart (novoLOG) injection 0-20 Units  0-20 Units Subcutaneous TID AC & HS Rust-Chester, Britton L, NP   15 Units at 02/03/21 2123   insulin aspart (novoLOG) injection 3 Units  3 Units Subcutaneous TID WC Darel Hong D, NP   3 Units at 02/03/21 1637   insulin glargine (LANTUS) injection 10 Units  10 Units Subcutaneous Daily Darel Hong D, NP   10 Units at 02/03/21 1007   ipratropium-albuterol (DUONEB) 0.5-2.5 (3) MG/3ML nebulizer solution 3 mL  3 mL Nebulization Q4H Darel Hong D, NP   3 mL at 02/04/21 0048   melatonin tablet 5 mg  5 mg Oral QHS Tyler Pita, MD   5 mg at 02/03/21 2122    methylPREDNISolone sodium succinate (SOLU-MEDROL) 125 mg/2 mL injection 80 mg  80 mg Intravenous Q8H Tyler Pita, MD   80 mg at 02/03/21 1813   metoprolol succinate (TOPROL-XL) 24 hr tablet 12.5 mg  12.5 mg Oral Daily Agbata, Tochukwu, MD   12.5 mg at 02/03/21 1006   mometasone-formoterol (DULERA) 200-5 MCG/ACT inhaler 2 puff  2 puff Inhalation BID Agbata, Tochukwu, MD   2 puff at 02/03/21 2124   morphine 2 MG/ML injection 2 mg  2 mg Intravenous Q2H PRN Flora Lipps, MD   2 mg at 02/03/21 1223   omega-3 acid ethyl esters (LOVAZA) capsule 2 g  2 g Oral BID Agbata, Tochukwu, MD   2 g at 02/03/21 2123   ondansetron (ZOFRAN) injection 4 mg  4 mg Intravenous Q6H PRN Collier Bullock, MD  sodium chloride flush (NS) 0.9 % injection 3 mL  3 mL Intravenous Q12H Agbata, Tochukwu, MD   3 mL at 02/03/21 2124   sodium chloride flush (NS) 0.9 % injection 3 mL  3 mL Intravenous PRN Agbata, Tochukwu, MD   3 mL at 02/02/21 2103   traZODone (DESYREL) tablet 100 mg  100 mg Oral QHS Agbata, Tochukwu, MD   100 mg at 02/03/21 2122      .  PHYSICAL EXAMINATION:  Vitals:   02/03/21 1900 02/03/21 2000  BP:    Pulse:    Resp:  18  Temp:  98 F (36.7 C)  SpO2: 91%    Filed Weights   02/01/21 0500 02/02/21 0442 02/03/21 0500  Weight: 210 lb 8.6 oz (95.5 kg) 211 lb 10.3 oz (96 kg) 205 lb 4 oz (93.1 kg)    Physical Exam Constitutional:      Comments: Patient propped up in the chair.  Patient accompanied by daughter/granddaughter.    HENT:     Head: Normocephalic and atraumatic.     Mouth/Throat:     Pharynx: No oropharyngeal exudate.  Eyes:     Pupils: Pupils are equal, round, and reactive to light.  Cardiovascular:     Rate and Rhythm: Normal rate and regular rhythm.  Pulmonary:     Effort: No respiratory distress.     Breath sounds: No wheezing.     Comments: Decreased breath sounds bilaterally; positive for crackles.  No wheeze. Abdominal:     General: Bowel sounds are normal. There is  no distension.     Palpations: Abdomen is soft. There is no mass.     Tenderness: There is no abdominal tenderness. There is no guarding or rebound.  Musculoskeletal:        General: No tenderness. Normal range of motion.     Cervical back: Normal range of motion and neck supple.  Skin:    General: Skin is warm.  Neurological:     Mental Status: He is alert and oriented to person, place, and time.  Psychiatric:        Mood and Affect: Affect normal.     LABORATORY DATA:  I have reviewed the data as listed Lab Results  Component Value Date   WBC 10.1 02/03/2021   HGB 10.4 (L) 02/03/2021   HCT 34.5 (L) 02/03/2021   MCV 98.2 02/03/2021   PLT 202 02/03/2021   Recent Labs    10/26/20 1002 01/24/21 1132 01/24/21 1405 01/25/21 0442 02/01/21 0439 02/02/21 0501 02/03/21 0346  NA 140   < >  --    < > 138 136 138  K 4.4   < >  --    < > 4.1 4.0 3.8  CL 101   < >  --    < > 94* 95* 98  CO2 34*   < >  --    < > 34* 33* 33*  GLUCOSE 167*   < >  --    < > 126* 124* 195*  BUN 19   < >  --    < > 39* 39* 44*  CREATININE 0.72   < >  --    < > 0.86 0.88 1.12  CALCIUM 9.0   < >  --    < > 9.0 8.7* 8.6*  GFRNONAA 91   < >  --    < > >60 >60 >60  GFRAA 105  --   --   --   --   --   --  PROT 6.5  --  6.9  --   --   --   --   ALBUMIN  --   --  3.6  --   --   --   --   AST 19  --  20  --   --   --   --   ALT 20  --  18  --   --   --   --   ALKPHOS  --   --  67  --   --   --   --   BILITOT 0.5  --  1.1  --   --   --   --   BILIDIR  --   --  0.2  --   --   --   --   IBILI  --   --  0.9  --   --   --   --    < > = values in this interval not displayed.    RADIOGRAPHIC STUDIES: I have personally reviewed the radiological images as listed and agreed with the findings in the report. DG Chest 1 View  Result Date: 01/27/2021 CLINICAL DATA:  Shortness of breath EXAM: CHEST  1 VIEW COMPARISON:  01/24/2021 FINDINGS: Cardiomegaly with mild interstitial pulmonary edema, unchanged. No pleural  effusion. IMPRESSION: Cardiomegaly and mild interstitial pulmonary edema. Electronically Signed   By: Ulyses Jarred M.D.   On: 01/27/2021 01:47   DG Chest 2 View  Result Date: 01/24/2021 CLINICAL DATA:  Shortness of breath. EXAM: CHEST - 2 VIEW COMPARISON:  10/28/2020. FINDINGS: Cardiomegaly. Bilateral interstitial prominence consistent interstitial edema. Cardiac B-lines noted. Pneumonitis cannot be excluded. No pleural effusion or pneumothorax. IMPRESSION: Cardiomegaly bilateral interstitial prominence most consistent interstitial edema. Findings consistent with CHF. Electronically Signed   By: Marcello Moores  Register   On: 01/24/2021 11:30   CT CHEST WO CONTRAST  Result Date: 02/03/2021 CLINICAL DATA:  cough. upper back pain. persistent hemoptysis. pneumonia. EXAM: CT CHEST WITHOUT CONTRAST TECHNIQUE: Multidetector CT imaging of the chest was performed following the standard protocol without IV contrast. COMPARISON: 02/03/2021 plain film.  PET 03/06/2019.  CTA chest 02/05/2019. FINDINGS: Cardiovascular: Aortic atherosclerosis. Tortuous thoracic aorta. Mild cardiomegaly, without pericardial effusion. Three vessel coronary artery calcification. Pulmonary artery enlargement, outflow tract 3.2 cm Mediastinum/Nodes: Node within the azygoesophageal recess measures 1.5 cm on 84/2, newly enlarged. Hilar regions poorly evaluated without intravenous contrast. A newly enlarged retrocrural node at 1.0 cm on 145/2. Lungs/Pleura: Trace, right larger than left pleural effusions. Moderate centrilobular emphysema. Multifocal, right greater than left airspace and ground-glass opacities, consistent with infection. Most significant in the right upper lobe, with near complete involvement. Some areas of smooth septal thickening also identified. No extraalveolar air to suggest necrosis. Upper Abdomen: Normal imaged portions of the liver, spleen, stomach, pancreas, gallbladder, adrenal glands, kidneys. Massive upper abdominal  adenopathy. Left periaortic nodal mass measures 3.9 x 5.7 cm on 170/2. Compare 1.4 x 2.1 cm on 02/05/2019. Gastrohepatic ligament nodal mass measures 6.6 by 8.6 cm versus 1.9 x 4.1 cm on the prior exam (when remeasured). Musculoskeletal: Osteopenia. Remote left rib fractures. Moderate T7 compression deformity is new since the prior CT. No ventral canal encroachment. IMPRESSION: 1. Right greater than left multifocal airspace and ground-glass opacity, most consistent with pneumonia. 2. Tiny bilateral pleural effusions. 3. Progression of massive upper abdominal adenopathy,consistent with lymphoma. 4. Development of mild thoracic adenopathy which could be reactive or related to thoracic extension of lymphoma. 5. Aortic atherosclerosis (ICD10-I70.0), coronary  artery atherosclerosis and emphysema (ICD10-J43.9). 6. Pulmonary artery enlargement suggests pulmonary arterial hypertension. 7. Interval T7 compression deformity. Electronically Signed   By: Abigail Miyamoto M.D.   On: 02/03/2021 09:54   CT ABDOMEN PELVIS W CONTRAST  Result Date: 02/03/2021 CLINICAL DATA:  Chronic lymphocytic leukemia/small lymphocytic lymphoma. EXAM: CT ABDOMEN AND PELVIS WITH CONTRAST TECHNIQUE: Multidetector CT imaging of the abdomen and pelvis was performed using the standard protocol following bolus administration of intravenous contrast. CONTRAST:  159mL OMNIPAQUE IOHEXOL 300 MG/ML  SOLN COMPARISON:  PET-CT 03/06/2019 FINDINGS: Lower chest: Consolidation posteriorly in the right lower lobe with airspace opacity posteriorly in the right middle lobe and hazy ground-glass opacities in the lingula. Confluent bandlike volume loss centrally in the left lower lobe extending to the lung base, these findings are generally similar to the chest CT from this morning. Trace bilateral pleural effusions. Mild cardiomegaly. Right coronary artery and descending thoracic aortic atherosclerosis. Hepatobiliary: 3 mm hypodense lesion in the left hepatic lobe on  image 14 series 2 is technically too small to characterize although statistically likely to be benign. Gallbladder unremarkable. No biliary dilatation. Pancreas: Unremarkable Spleen: No splenomegaly or focal splenic lesion. Adrenals/Urinary Tract: Small amount of gas in the urinary bladder, query recent catheterization. Otherwise unremarkable. Stomach/Bowel: Descending and sigmoid colon diverticulosis. Vascular/Lymphatic: Aortoiliac atherosclerotic vascular disease. Compared to PET-CT of 03/06/2019 there is substantial increase in bulky adenopathy along the gastrohepatic ligament/celiac chain and in the retroperitoneum. A celiac node measures 5.4 cm in short axis on image 25 series 2, previously 2.1 cm. The retroperitoneal adenopathy now forms and encasing rind which displaces the aorta from the spine. The analogous portion of this rind to a previously 2.5 cm in short axis right retroperitoneal lymph node currently measures 4.7 cm. The conglomerate adenopathy in the periaortic region in total measures about 14.5 by 7.1 by 16.5 cm. This adenopathy extends down to the common iliac level but not into the lower pelvis. Reproductive: The prostate gland measures 5.5 by 3.8 by 5.3 cm (volume = 58 cm^3), compatible with mild prostatomegaly. Other: No supplemental non-categorized findings. Musculoskeletal: Callus formation compatible with late phase healing of left seventh and eighth lateral rib fractures. Transitional S1 vertebra. Degenerative disc disease at L5-S1 thought to likely be causing bilateral foraminal impingement and central narrowing of the thecal sac. Disc bulge and facet arthropathy at L3-4 and L4-5 likely cause mild bilateral impingement at L4-5 and mild right foraminal impingement at L3-4. Small umbilical hernia contains adipose tissue. IMPRESSION: 1. Substantial increase in bulky celiac and retroperitoneal adenopathy compared to the 03/06/2019 PET-CT. Appearance compatible with limb for proliferative  process. 2. Stable basilar airspace opacities, right greater than left, with stable trace bilateral pleural effusions. 3. Lower lumbar impingement due to spondylosis and degenerative disc disease. 4. Mild prostatomegaly. 5.  Aortic Atherosclerosis (ICD10-I70.0). 6. Descending and sigmoid colon diverticulosis. 7. Small umbilical hernia contains adipose tissue. Electronically Signed   By: Van Clines M.D.   On: 02/03/2021 15:39   US Venous Img Lower Bilateral (DVT)  Result Date: 01/26/2021 CLINICAL DATA:  Bilateral leg swelling EXAM: Bilateral LOWER EXTREMITY VENOUS DOPPLER ULTRASOUND TECHNIQUE: Gray-scale sonography with compression, as well as color and duplex ultrasound, were performed to evaluate the deep venous system(s) from the level of the common femoral vein through the popliteal and proximal calf veins. COMPARISON:  06/25/2019 FINDINGS: VENOUS Normal compressibility of the common femoral, superficial femoral, and popliteal veins, as well as the visualized calf veins. Visualized portions of profunda femoral vein and  great saphenous vein unremarkable. No filling defects to suggest DVT on grayscale or color Doppler imaging. Doppler waveforms show normal direction of venous flow, normal respiratory plasticity and response to augmentation. OTHER None. Limitations: none IMPRESSION: Negative. Electronically Signed   By: Donavan Foil M.D.   On: 01/26/2021 01:34   DG Chest Port 1 View  Result Date: 02/03/2021 CLINICAL DATA:  Hypoxia EXAM: PORTABLE CHEST 1 VIEW COMPARISON:  January 31, 2021 FINDINGS: There is extensive airspace opacity in the right upper and mid lung regions, slightly increased. There is patchy airspace opacity in the left mid lung, stable, and in the left base, marginally increased. There is stable cardiomegaly with a degree of pulmonary venous hypertension. There is aortic atherosclerosis. No adenopathy no bone lesions. IMPRESSION: Multifocal airspace opacity, increased in the right upper  and mid lung regions and subtly increased in the left base. Stable cardiac prominence with a degree of pulmonary vascular congestion. Aortic Atherosclerosis (ICD10-I70.0). Electronically Signed   By: Lowella Grip III M.D.   On: 02/03/2021 08:01   DG Chest Port 1 View  Result Date: 01/31/2021 CLINICAL DATA:  Respiratory failure. EXAM: PORTABLE CHEST 1 VIEW COMPARISON:  01/27/2021. FINDINGS: Mediastinum and hilar structures normal. Cardiomegaly again noted. New prominent right upper lobe alveolar infiltrate/edema. Persistent bilateral interstitial prominence consistent with interstitial edema and or pneumonitis. Tiny right pleural effusion. No pneumothorax. IMPRESSION: 1.  Cardiomegaly again noted. 2. New prominent right upper lobe alveolar infiltrates/edema. Persistent bilateral interstitial prominence consistent interstitial edema and or pneumonitis. Tiny right pleural effusion. Electronically Signed   By: Marcello Moores  Register   On: 01/31/2021 05:48   ECHOCARDIOGRAM COMPLETE  Result Date: 01/25/2021    ECHOCARDIOGRAM REPORT   Patient Name:   Julian Sparks Date of Exam: 01/24/2021 Medical Rec #:  [id number]     Height:       66.0 in Accession #:    [id number]    Weight:       234.0 lb Date of Birth:  19-Oct-1945     BSA:          2.138 m Patient Age:    60 years      BP:           126/62 mmHg Patient Gender: M             HR:           80 bpm. Exam Location:  [facility] Procedure: 2D Echo, Cardiac Doppler, Color Doppler and Intracardiac            Opacification Agent Indications:     K87.68 Acute Diastolic Heart Failure  History:         Patient has prior history of Echocardiogram examinations, most                  recent 02/05/2019. Risk Factors:Hypertension and Diabetes.                  Dyspnea. COPD.  Sonographer:     Wilford Sports Rodgers-Jones Referring Phys:  [id number] OMBTDHRC AGBATA Diagnosing Phys: Ida Rogue MD  Sonographer Comments: Technically difficult study due to poor echo windows. IMPRESSIONS  1. Left  ventricular ejection fraction, by estimation, is 50 to 55%. The left ventricle has low normal function. Hypokinesis of septal wall. Left ventricular diastolic parameters are consistent with Grade II diastolic dysfunction (pseudonormalization).  2. Right ventricular systolic function is moderately reduced. The right ventricular size is severely enlarged. Tricuspid regurgitation signal is inadequate for assessing PA  pressure.  3. Right atrial size was mildly dilated. FINDINGS  Left Ventricle: Left ventricular ejection fraction, by estimation, is 50 to 55%. The left ventricle has low normal function. The left ventricle has no regional wall motion abnormalities. Definity contrast agent was given IV to delineate the left ventricular endocardial borders. The left ventricular internal cavity size was normal in size. There is no left ventricular hypertrophy. Left ventricular diastolic parameters are consistent with Grade II diastolic dysfunction (pseudonormalization). Right Ventricle: The right ventricular size is severely enlarged. No increase in right ventricular wall thickness. Right ventricular systolic function is moderately reduced. Tricuspid regurgitation signal is inadequate for assessing PA pressure. Left Atrium: Left atrial size was normal in size. Right Atrium: Right atrial size was mildly dilated. Pericardium: There is no evidence of pericardial effusion. Mitral Valve: The mitral valve is normal in structure. No evidence of mitral valve regurgitation. No evidence of mitral valve stenosis. Tricuspid Valve: The tricuspid valve is normal in structure. Tricuspid valve regurgitation is not demonstrated. No evidence of tricuspid stenosis. Aortic Valve: The aortic valve is normal in structure. Aortic valve regurgitation is not visualized. No aortic stenosis is present. Aortic valve peak gradient measures 7.1 mmHg. Pulmonic Valve: The pulmonic valve was normal in structure. Pulmonic valve regurgitation is not  visualized. No evidence of pulmonic stenosis. Aorta: The aortic root is normal in size and structure. Venous: The pulmonary veins were not well visualized. The inferior vena cava is normal in size with greater than 50% respiratory variability, suggesting right atrial pressure of 3 mmHg. IAS/Shunts: No atrial level shunt detected by color flow Doppler.  LEFT VENTRICLE PLAX 2D LVIDd:         5.30 cm Diastology LVIDs:         3.70 cm LV e' medial:    10.10 cm/s LV PW:         1.00 cm LV E/e' medial:  8.7 LV IVS:        1.00 cm LV e' lateral:   9.48 cm/s                        LV E/e' lateral: 9.3  RIGHT VENTRICLE             IVC RV Basal diam:  5.70 cm     IVC diam: 2.40 cm RV S prime:     15.35 cm/s TAPSE (M-mode): 2.1 cm LEFT ATRIUM             Index       RIGHT ATRIUM           Index LA diam:        4.40 cm 2.06 cm/m  RA Area:     24.60 cm LA Vol (A2C):   63.5 ml 29.70 ml/m RA Volume:   94.30 ml  44.11 ml/m LA Vol (A4C):   39.2 ml 18.34 ml/m LA Biplane Vol: 50.7 ml 23.72 ml/m  AORTIC VALVE AV Vmax:      133.00 cm/s AV Peak Grad: 7.1 mmHg  AORTA Ao Root diam: 3.80 cm MITRAL VALVE               TRICUSPID VALVE MV Area (PHT): 5.27 cm    TR Peak grad:   31.4 mmHg MV Decel Time: 144 msec    TR Vmax:        280.00 cm/s MV E velocity: 88.10 cm/s MV A velocity: 76.50 cm/s MV E/A ratio:  1.15 Ida Rogue MD  Electronically signed by Ida Rogue MD Signature Date/Time: 01/25/2021/1:34:42 PM    Final     Lymphoma, small lymphocytic [facility]) #75 year old male patient with multiple medical problems including chronic respiratory failure/history of small lymphocytic lymphoma is currently admitted to hospital for worsening shortness of breath  #Small lymphocytic lymphoma-currently under surveillance-noted to have progression of disease on imaging.  Patient is currently not symptomatic; however given the bulky disease suspect patient symptomatic in the next few months.   #Acute on chronic respiratory failure/acute  CHF/COPD-currently on high flow nasal oxygen  Recommendation:  #Discussed with patient and daughter regarding the concerns of progression noted on the imaging.  Patient is not significantly symptomatic at this time.  However it is quite likely that patient will become symptomatic soon from his underlying lymphoma.  I think is reasonable to consider initiation of treatment once acute issues resolve.  Discussed include ibrutinib vs anti-CD20 antibody therapies.  Again reminded the patient and family that treatment options would be recommended only after acute issues resolve/to be addressed on outpatient basis.  I do not suspect patient's current acute respiratory failure is related to his progressive lymphoma.  Thank you Dr. for allowing me to participate in the care of your pleasant patient. Please do not hesitate to contact me with questions or concerns in the interim.  Discussed with patient's daughter/granddaughter at bedside.  # I reviewed the blood work- with the patient's daughter in detail; also reviewed the imaging independently [as summarized above]; and with the patient in detail.    All questions were answered. The patient knows to call the clinic with any problems, questions or concerns.   Cammie Sickle, MD 02/04/2021 1:29 AM

## 2021-02-03 NOTE — Progress Notes (Signed)
Progress Note  Patient Name: Julian Sparks Date of Encounter: 02/03/2021  [facility] Cardiologist: None   Subjective   Feels relatively well without significant dyspnea.  Patient denies chest pain, palpitations, and lightheadedness.  Edema almost completely resolved.  Inpatient Medications    Scheduled Meds:  vitamin C  500 mg Oral Daily   atorvastatin  40 mg Oral Daily   budesonide (PULMICORT) nebulizer solution  0.5 mg Nebulization BID   Chlorhexidine Gluconate Cloth  6 each Topical Q0600   fluticasone  1 spray Each Nare BID   gabapentin  300 mg Oral TID   insulin aspart  0-20 Units Subcutaneous TID AC & HS   insulin aspart  3 Units Subcutaneous TID WC   insulin glargine  10 Units Subcutaneous Daily   ipratropium-albuterol  3 mL Nebulization Q4H   melatonin  5 mg Oral QHS   methylPREDNISolone (SOLU-MEDROL) injection  80 mg Intravenous Q8H   metoprolol succinate  12.5 mg Oral Daily   mometasone-formoterol  2 puff Inhalation BID   omega-3 acid ethyl esters  2 g Oral BID   sodium chloride flush  3 mL Intravenous Q12H   traZODone  100 mg Oral QHS   Continuous Infusions:  sodium chloride      ceFAZolin (ANCEF) IV Stopped (02/03/21 1433)   PRN Meds: sodium chloride, acetaminophen, clonazepam, cyclobenzaprine, guaiFENesin-dextromethorphan, morphine injection, ondansetron (ZOFRAN) IV, sodium chloride flush   Vital Signs    Vitals:   02/03/21 1500 02/03/21 1600 02/03/21 1700 02/03/21 1800  BP: 122/63 (!) 122/59 125/68 118/60  Pulse: 87 82 93 90  Resp: 19 17 (!) 24 20  Temp:      TempSrc:      SpO2: 98% 93% (!) 87% 93%  Weight:        Intake/Output Summary (Last 24 hours) at 02/03/2021 1835 Last data filed at 02/03/2021 1800 Gross per 24 hour  Intake 377.07 ml  Output 3500 ml  Net -3122.93 ml   Last 3 Weights 02/03/2021 02/02/2021 02/01/2021  Weight (lbs) 205 lb 4 oz 211 lb 10.3 oz 210 lb 8.6 oz  Weight (kg) 93.1 kg 96 kg 95.5 kg      Telemetry    Normal  sinus rhythm with PVCs and an episode of AIVR this morning - Personally Reviewed  ECG    No new tracing  Physical Exam   GEN: No acute distress.  High flow nasal cannula in place. Neck: No JVD Cardiac: RRR, no murmurs, rubs, or gallops.  Respiratory: Bilateral dry crackles.  Normal work of breathing. GI: Soft, nontender, non-distended  MS: Trace pretibial edema bilaterally; No deformity. Neuro:  Nonfocal  Psych: Normal affect   Labs    High Sensitivity Troponin:   Recent Labs  Lab 01/24/21 1132  TROPONINIHS 13      Chemistry Recent Labs  Lab 02/01/21 0439 02/02/21 0501 02/03/21 0346  NA 138 136 138  K 4.1 4.0 3.8  CL 94* 95* 98  CO2 34* 33* 33*  GLUCOSE 126* 124* 195*  BUN 39* 39* 44*  CREATININE 0.86 0.88 1.12  CALCIUM 9.0 8.7* 8.6*  GFRNONAA >60 >60 >60  ANIONGAP 10 8 7      Hematology Recent Labs  Lab 02/01/21 0439 02/02/21 0501 02/03/21 0346 02/03/21 1004  WBC 9.8 9.6 10.1  --   RBC 3.66* 3.41* 3.26*  --   HGB 11.0* 10.2* 9.7* 10.4*  HCT 36.3* 32.7* 32.0* 34.5*  MCV 99.2 95.9 98.2  --   MCH  30.1 29.9 29.8  --   MCHC 30.3 31.2 30.3  --   RDW 14.7 14.5 14.6  --   PLT 197 190 202  --     BNP Recent Labs  Lab 02/03/21 0346  BNP 163.9*     DDimer No results for input(s): DDIMER in the last 168 hours.   Radiology    CT CHEST WO CONTRAST  Result Date: 02/03/2021 CLINICAL DATA:  cough. upper back pain. persistent hemoptysis. pneumonia. EXAM: CT CHEST WITHOUT CONTRAST TECHNIQUE: Multidetector CT imaging of the chest was performed following the standard protocol without IV contrast. COMPARISON: 02/03/2021 plain film.  PET 03/06/2019.  CTA chest 02/05/2019. FINDINGS: Cardiovascular: Aortic atherosclerosis. Tortuous thoracic aorta. Mild cardiomegaly, without pericardial effusion. Three vessel coronary artery calcification. Pulmonary artery enlargement, outflow tract 3.2 cm Mediastinum/Nodes: Node within the azygoesophageal recess measures 1.5 cm on  84/2, newly enlarged. Hilar regions poorly evaluated without intravenous contrast. A newly enlarged retrocrural node at 1.0 cm on 145/2. Lungs/Pleura: Trace, right larger than left pleural effusions. Moderate centrilobular emphysema. Multifocal, right greater than left airspace and ground-glass opacities, consistent with infection. Most significant in the right upper lobe, with near complete involvement. Some areas of smooth septal thickening also identified. No extraalveolar air to suggest necrosis. Upper Abdomen: Normal imaged portions of the liver, spleen, stomach, pancreas, gallbladder, adrenal glands, kidneys. Massive upper abdominal adenopathy. Left periaortic nodal mass measures 3.9 x 5.7 cm on 170/2. Compare 1.4 x 2.1 cm on 02/05/2019. Gastrohepatic ligament nodal mass measures 6.6 by 8.6 cm versus 1.9 x 4.1 cm on the prior exam (when remeasured). Musculoskeletal: Osteopenia. Remote left rib fractures. Moderate T7 compression deformity is new since the prior CT. No ventral canal encroachment. IMPRESSION: 1. Right greater than left multifocal airspace and ground-glass opacity, most consistent with pneumonia. 2. Tiny bilateral pleural effusions. 3. Progression of massive upper abdominal adenopathy,consistent with lymphoma. 4. Development of mild thoracic adenopathy which could be reactive or related to thoracic extension of lymphoma. 5. Aortic atherosclerosis (ICD10-I70.0), coronary artery atherosclerosis and emphysema (ICD10-J43.9). 6. Pulmonary artery enlargement suggests pulmonary arterial hypertension. 7. Interval T7 compression deformity. Electronically Signed   By: Abigail Miyamoto M.D.   On: 02/03/2021 09:54   CT ABDOMEN PELVIS W CONTRAST  Result Date: 02/03/2021 CLINICAL DATA:  Chronic lymphocytic leukemia/small lymphocytic lymphoma. EXAM: CT ABDOMEN AND PELVIS WITH CONTRAST TECHNIQUE: Multidetector CT imaging of the abdomen and pelvis was performed using the standard protocol following bolus  administration of intravenous contrast. CONTRAST:  152mL OMNIPAQUE IOHEXOL 300 MG/ML  SOLN COMPARISON:  PET-CT 03/06/2019 FINDINGS: Lower chest: Consolidation posteriorly in the right lower lobe with airspace opacity posteriorly in the right middle lobe and hazy ground-glass opacities in the lingula. Confluent bandlike volume loss centrally in the left lower lobe extending to the lung base, these findings are generally similar to the chest CT from this morning. Trace bilateral pleural effusions. Mild cardiomegaly. Right coronary artery and descending thoracic aortic atherosclerosis. Hepatobiliary: 3 mm hypodense lesion in the left hepatic lobe on image 14 series 2 is technically too small to characterize although statistically likely to be benign. Gallbladder unremarkable. No biliary dilatation. Pancreas: Unremarkable Spleen: No splenomegaly or focal splenic lesion. Adrenals/Urinary Tract: Small amount of gas in the urinary bladder, query recent catheterization. Otherwise unremarkable. Stomach/Bowel: Descending and sigmoid colon diverticulosis. Vascular/Lymphatic: Aortoiliac atherosclerotic vascular disease. Compared to PET-CT of 03/06/2019 there is substantial increase in bulky adenopathy along the gastrohepatic ligament/celiac chain and in the retroperitoneum. A celiac node measures 5.4 cm in  short axis on image 25 series 2, previously 2.1 cm. The retroperitoneal adenopathy now forms and encasing rind which displaces the aorta from the spine. The analogous portion of this rind to a previously 2.5 cm in short axis right retroperitoneal lymph node currently measures 4.7 cm. The conglomerate adenopathy in the periaortic region in total measures about 14.5 by 7.1 by 16.5 cm. This adenopathy extends down to the common iliac level but not into the lower pelvis. Reproductive: The prostate gland measures 5.5 by 3.8 by 5.3 cm (volume = 58 cm^3), compatible with mild prostatomegaly. Other: No supplemental non-categorized  findings. Musculoskeletal: Callus formation compatible with late phase healing of left seventh and eighth lateral rib fractures. Transitional S1 vertebra. Degenerative disc disease at L5-S1 thought to likely be causing bilateral foraminal impingement and central narrowing of the thecal sac. Disc bulge and facet arthropathy at L3-4 and L4-5 likely cause mild bilateral impingement at L4-5 and mild right foraminal impingement at L3-4. Small umbilical hernia contains adipose tissue. IMPRESSION: 1. Substantial increase in bulky celiac and retroperitoneal adenopathy compared to the 03/06/2019 PET-CT. Appearance compatible with limb for proliferative process. 2. Stable basilar airspace opacities, right greater than left, with stable trace bilateral pleural effusions. 3. Lower lumbar impingement due to spondylosis and degenerative disc disease. 4. Mild prostatomegaly. 5.  Aortic Atherosclerosis (ICD10-I70.0). 6. Descending and sigmoid colon diverticulosis. 7. Small umbilical hernia contains adipose tissue. Electronically Signed   By: Van Clines M.D.   On: 02/03/2021 15:39   DG Chest Port 1 View  Result Date: 02/03/2021 CLINICAL DATA:  Hypoxia EXAM: PORTABLE CHEST 1 VIEW COMPARISON:  January 31, 2021 FINDINGS: There is extensive airspace opacity in the right upper and mid lung regions, slightly increased. There is patchy airspace opacity in the left mid lung, stable, and in the left base, marginally increased. There is stable cardiomegaly with a degree of pulmonary venous hypertension. There is aortic atherosclerosis. No adenopathy no bone lesions. IMPRESSION: Multifocal airspace opacity, increased in the right upper and mid lung regions and subtly increased in the left base. Stable cardiac prominence with a degree of pulmonary vascular congestion. Aortic Atherosclerosis (ICD10-I70.0). Electronically Signed   By: Lowella Grip III M.D.   On: 02/03/2021 08:01    Cardiac Studies   TTE (01/24/2021):  1. Left  ventricular ejection fraction, by estimation, is 50 to 55%. The  left ventricle has low normal function. Hypokinesis of septal wall. Left  ventricular diastolic parameters are consistent with Grade II diastolic  dysfunction (pseudonormalization).   2. Right ventricular systolic function is moderately reduced. The right  ventricular size is severely enlarged. Tricuspid regurgitation signal is  inadequate for assessing PA pressure.   3. Right atrial size was mildly dilated.   Patient Profile     75 y.o. male with chronic HFpEF, COPD, and chronic respiratory failure with hypoxia, admitted with worsening respiratory failure.  Assessment & Plan    HFpEF and right heart failure: Patient's constellation of symptoms are most concerning for right greater than left heart failure based on recent echocardiogram.  CT chest today shows possible pneumonia superimposed on chronic lung changes.  Patient has only trace edema on exam today but otherwise appears fairly euvolemic.  Creatinine has increased slightly from yesterday after transitioning from metolazone plus furosemide to metolazone plus acetazolamide.  Given concern for worsening metabolic alkalosis, we will plan to continue metolazone and acetazolamide for 1 more day, though I would have a low threshold for de-escalating diuresis if creatinine continues to trend  up. Continue metolazone and acetazolamide for at least 1 more day. Plan for right heart catheterization on Monday if supplemental oxygen can be weaned down.  COPD and acute on chronic respiratory failure with hypoxia and hypercapnia: Continue bronchodilators and methylprednisolone per critical care medicine. Continue antibiotics per CCM. Wean supplemental oxygen as tolerated.  Accelerated idioventricular rhythm: Incidentally noted today.  No angina or worsening dyspnea experienced by patient.  Potassium 3.8 this morning. Continue low-dose metoprolol.  Replete potassium for target greater  than 4.0.  For questions or updates, please contact [place] Please consult [email or web address] for contact info under [facility].     Signed, Nelva Bush, MD  02/03/2021, 6:35 PM

## 2021-02-04 DIAGNOSIS — C8300 Small cell B-cell lymphoma, unspecified site: Secondary | ICD-10-CM

## 2021-02-04 LAB — BASIC METABOLIC PANEL
Anion gap: 6 (ref 5–15)
BUN: 48 mg/dL — ABNORMAL HIGH (ref 8–23)
CO2: 35 mmol/L — ABNORMAL HIGH (ref 22–32)
Calcium: 8.5 mg/dL — ABNORMAL LOW (ref 8.9–10.3)
Chloride: 96 mmol/L — ABNORMAL LOW (ref 98–111)
Creatinine, Ser: 1.09 mg/dL (ref 0.61–1.24)
GFR, Estimated: >60 mL/min (ref >60)
Glucose, Bld: 258 mg/dL — ABNORMAL HIGH (ref 70–99)
Potassium: 4.8 mmol/L (ref 3.5–5.1)
Sodium: 137 mmol/L (ref 135–145)

## 2021-02-04 LAB — CBC
HCT: 31.4 % — ABNORMAL LOW (ref 39.0–52.0)
Hemoglobin: 9.6 g/dL — ABNORMAL LOW (ref 13.0–17.0)
MCH: 29.8 pg (ref 26.0–34.0)
MCHC: 30.6 g/dL (ref 30.0–36.0)
MCV: 97.5 fL (ref 80.0–100.0)
Platelets: 214 10*3/uL (ref 150–400)
RBC: 3.22 MIL/uL — ABNORMAL LOW (ref 4.22–5.81)
RDW: 14.4 % (ref 11.5–15.5)
WBC: 11.3 10*3/uL — ABNORMAL HIGH (ref 4.0–10.5)
nRBC: 0 % (ref 0.0–0.2)

## 2021-02-04 LAB — GLUCOSE, CAPILLARY
Glucose-Capillary: 254 mg/dL — ABNORMAL HIGH (ref 70–99)
Glucose-Capillary: 282 mg/dL — ABNORMAL HIGH (ref 70–99)
Glucose-Capillary: 333 mg/dL — ABNORMAL HIGH (ref 70–99)
Glucose-Capillary: 310 mg/dL — ABNORMAL HIGH (ref 70–99)

## 2021-02-04 LAB — RHEUMATOID FACTOR: Rheumatoid fact SerPl-aCnc: 13.5 IU/mL (ref <14.0)

## 2021-02-04 MED ORDER — INSULIN GLARGINE 100 UNIT/ML ~~LOC~~ SOLN
15.0000 [IU] | Freq: Every day | SUBCUTANEOUS | Status: DC
  Administered 2021-02-05: 15 [IU] via SUBCUTANEOUS
  Filled 2021-02-04 (×3): qty 0.15

## 2021-02-04 MED ORDER — IPRATROPIUM-ALBUTEROL 0.5-2.5 (3) MG/3ML IN SOLN
3.0000 mL | Freq: Four times a day (QID) | RESPIRATORY_TRACT | Status: DC
  Administered 2021-02-04 – 2021-02-05 (×3): 3 mL via RESPIRATORY_TRACT
  Filled 2021-02-04 (×3): qty 3

## 2021-02-04 MED ORDER — HEPARIN SODIUM (PORCINE) 5000 UNIT/ML IJ SOLN
5000.0000 [IU] | Freq: Three times a day (TID) | INTRAMUSCULAR | Status: DC
  Administered 2021-02-04 – 2021-02-05 (×3): 5000 [IU] via SUBCUTANEOUS
  Filled 2021-02-04 (×3): qty 1

## 2021-02-04 MED ORDER — INSULIN ASPART 100 UNIT/ML IJ SOLN
7.0000 [IU] | Freq: Once | INTRAMUSCULAR | Status: AC
  Administered 2021-02-04: 7 [IU] via SUBCUTANEOUS
  Filled 2021-02-04: qty 1

## 2021-02-04 MED ORDER — INSULIN ASPART 100 UNIT/ML IJ SOLN
5.0000 [IU] | Freq: Three times a day (TID) | INTRAMUSCULAR | Status: DC
  Administered 2021-02-04: 5 [IU] via SUBCUTANEOUS
  Filled 2021-02-04: qty 1

## 2021-02-04 NOTE — Progress Notes (Signed)
Progress Note  Patient Name: Julian Sparks Date of Encounter: 02/04/2021  [facility] Cardiologist: Dr. Rockey Situ  Subjective   No acute events overnight, BiPAP mask on nightly, switched this morning to high flow oxygen.  Net -2.3 L of diuretics over the past 24 hours.  Will like something to drink.  Inpatient Medications    Scheduled Meds:  vitamin C  500 mg Oral Daily   atorvastatin  40 mg Oral Daily   budesonide (PULMICORT) nebulizer solution  0.5 mg Nebulization BID   Chlorhexidine Gluconate Cloth  6 each Topical Q0600   fluticasone  1 spray Each Nare BID   gabapentin  300 mg Oral TID   insulin aspart  0-20 Units Subcutaneous TID AC & HS   insulin aspart  3 Units Subcutaneous TID WC   insulin glargine  10 Units Subcutaneous Daily   ipratropium-albuterol  3 mL Nebulization Q4H   melatonin  5 mg Oral QHS   methylPREDNISolone (SOLU-MEDROL) injection  80 mg Intravenous Q8H   metoprolol succinate  12.5 mg Oral Daily   mometasone-formoterol  2 puff Inhalation BID   omega-3 acid ethyl esters  2 g Oral BID   sodium chloride flush  3 mL Intravenous Q12H   traZODone  100 mg Oral QHS   Continuous Infusions:  sodium chloride      ceFAZolin (ANCEF) IV 2 g (02/04/21 0614)   PRN Meds: sodium chloride, acetaminophen, clonazepam, cyclobenzaprine, guaiFENesin-dextromethorphan, morphine injection, ondansetron (ZOFRAN) IV, sodium chloride flush   Vital Signs    Vitals:   02/03/21 2000 02/04/21 0232 02/04/21 0746 02/04/21 1003  BP:   123/62   Pulse:   76   Resp: 18  17   Temp: 98 F (36.7 C)  97.6 F (36.4 C)   TempSrc: Oral  Axillary   SpO2:  96% 94% 96%  Weight:        Intake/Output Summary (Last 24 hours) at 02/04/2021 1126 Last data filed at 02/03/2021 1910 Gross per 24 hour  Intake 100 ml  Output 1700 ml  Net -1600 ml   Last 3 Weights 02/03/2021 02/02/2021 02/01/2021  Weight (lbs) 205 lb 4 oz 211 lb 10.3 oz 210 lb 8.6 oz  Weight (kg) 93.1 kg 96 kg 95.5 kg       Telemetry    Sinus rhythm- Personally Reviewed  ECG    No new tracing obtained- Personally Reviewed  Physical Exam   GEN:  Mild respiratory distress Neck: No JVD Cardiac: RRR Respiratory:  Rhonchorous breath sounds GI: Soft, nontender, non-distended MS: No edema; No deformity. Neuro:  Nonfocal Psych: Normal affect  Labs    High Sensitivity Troponin:   Recent Labs  Lab 01/24/21 1132  TROPONINIHS 13      Chemistry Recent Labs  Lab 02/02/21 0501 02/03/21 0346 02/04/21 0452  NA 136 138 137  K 4.0 3.8 4.8  CL 95* 98 96*  CO2 33* 33* 35*  GLUCOSE 124* 195* 258*  BUN 39* 44* 48*  CREATININE 0.88 1.12 1.09  CALCIUM 8.7* 8.6* 8.5*  GFRNONAA >60 >60 >60  ANIONGAP 8 7 6      Hematology Recent Labs  Lab 02/02/21 0501 02/03/21 0346 02/03/21 1004 02/04/21 0452  WBC 9.6 10.1  --  11.3*  RBC 3.41* 3.26*  --  3.22*  HGB 10.2* 9.7* 10.4* 9.6*  HCT 32.7* 32.0* 34.5* 31.4*  MCV 95.9 98.2  --  97.5  MCH 29.9 29.8  --  29.8  MCHC 31.2 30.3  --  30.6  RDW 14.5 14.6  --  14.4  PLT 190 202  --  214    BNP Recent Labs  Lab 02/03/21 0346  BNP 163.9*     DDimer No results for input(s): DDIMER in the last 168 hours.   Radiology    CT CHEST WO CONTRAST  Result Date: 02/03/2021 CLINICAL DATA:  cough. upper back pain. persistent hemoptysis. pneumonia. EXAM: CT CHEST WITHOUT CONTRAST TECHNIQUE: Multidetector CT imaging of the chest was performed following the standard protocol without IV contrast. COMPARISON: 02/03/2021 plain film.  PET 03/06/2019.  CTA chest 02/05/2019. FINDINGS: Cardiovascular: Aortic atherosclerosis. Tortuous thoracic aorta. Mild cardiomegaly, without pericardial effusion. Three vessel coronary artery calcification. Pulmonary artery enlargement, outflow tract 3.2 cm Mediastinum/Nodes: Node within the azygoesophageal recess measures 1.5 cm on 84/2, newly enlarged. Hilar regions poorly evaluated without intravenous contrast. A newly enlarged  retrocrural node at 1.0 cm on 145/2. Lungs/Pleura: Trace, right larger than left pleural effusions. Moderate centrilobular emphysema. Multifocal, right greater than left airspace and ground-glass opacities, consistent with infection. Most significant in the right upper lobe, with near complete involvement. Some areas of smooth septal thickening also identified. No extraalveolar air to suggest necrosis. Upper Abdomen: Normal imaged portions of the liver, spleen, stomach, pancreas, gallbladder, adrenal glands, kidneys. Massive upper abdominal adenopathy. Left periaortic nodal mass measures 3.9 x 5.7 cm on 170/2. Compare 1.4 x 2.1 cm on 02/05/2019. Gastrohepatic ligament nodal mass measures 6.6 by 8.6 cm versus 1.9 x 4.1 cm on the prior exam (when remeasured). Musculoskeletal: Osteopenia. Remote left rib fractures. Moderate T7 compression deformity is new since the prior CT. No ventral canal encroachment. IMPRESSION: 1. Right greater than left multifocal airspace and ground-glass opacity, most consistent with pneumonia. 2. Tiny bilateral pleural effusions. 3. Progression of massive upper abdominal adenopathy,consistent with lymphoma. 4. Development of mild thoracic adenopathy which could be reactive or related to thoracic extension of lymphoma. 5. Aortic atherosclerosis (ICD10-I70.0), coronary artery atherosclerosis and emphysema (ICD10-J43.9). 6. Pulmonary artery enlargement suggests pulmonary arterial hypertension. 7. Interval T7 compression deformity. Electronically Signed   By: Abigail Miyamoto M.D.   On: 02/03/2021 09:54   CT ABDOMEN PELVIS W CONTRAST  Result Date: 02/03/2021 CLINICAL DATA:  Chronic lymphocytic leukemia/small lymphocytic lymphoma. EXAM: CT ABDOMEN AND PELVIS WITH CONTRAST TECHNIQUE: Multidetector CT imaging of the abdomen and pelvis was performed using the standard protocol following bolus administration of intravenous contrast. CONTRAST:  166mL OMNIPAQUE IOHEXOL 300 MG/ML  SOLN COMPARISON:   PET-CT 03/06/2019 FINDINGS: Lower chest: Consolidation posteriorly in the right lower lobe with airspace opacity posteriorly in the right middle lobe and hazy ground-glass opacities in the lingula. Confluent bandlike volume loss centrally in the left lower lobe extending to the lung base, these findings are generally similar to the chest CT from this morning. Trace bilateral pleural effusions. Mild cardiomegaly. Right coronary artery and descending thoracic aortic atherosclerosis. Hepatobiliary: 3 mm hypodense lesion in the left hepatic lobe on image 14 series 2 is technically too small to characterize although statistically likely to be benign. Gallbladder unremarkable. No biliary dilatation. Pancreas: Unremarkable Spleen: No splenomegaly or focal splenic lesion. Adrenals/Urinary Tract: Small amount of gas in the urinary bladder, query recent catheterization. Otherwise unremarkable. Stomach/Bowel: Descending and sigmoid colon diverticulosis. Vascular/Lymphatic: Aortoiliac atherosclerotic vascular disease. Compared to PET-CT of 03/06/2019 there is substantial increase in bulky adenopathy along the gastrohepatic ligament/celiac chain and in the retroperitoneum. A celiac node measures 5.4 cm in short axis on image 25 series 2, previously 2.1 cm. The retroperitoneal adenopathy  now forms and encasing rind which displaces the aorta from the spine. The analogous portion of this rind to a previously 2.5 cm in short axis right retroperitoneal lymph node currently measures 4.7 cm. The conglomerate adenopathy in the periaortic region in total measures about 14.5 by 7.1 by 16.5 cm. This adenopathy extends down to the common iliac level but not into the lower pelvis. Reproductive: The prostate gland measures 5.5 by 3.8 by 5.3 cm (volume = 58 cm^3), compatible with mild prostatomegaly. Other: No supplemental non-categorized findings. Musculoskeletal: Callus formation compatible with late phase healing of left seventh and eighth  lateral rib fractures. Transitional S1 vertebra. Degenerative disc disease at L5-S1 thought to likely be causing bilateral foraminal impingement and central narrowing of the thecal sac. Disc bulge and facet arthropathy at L3-4 and L4-5 likely cause mild bilateral impingement at L4-5 and mild right foraminal impingement at L3-4. Small umbilical hernia contains adipose tissue. IMPRESSION: 1. Substantial increase in bulky celiac and retroperitoneal adenopathy compared to the 03/06/2019 PET-CT. Appearance compatible with limb for proliferative process. 2. Stable basilar airspace opacities, right greater than left, with stable trace bilateral pleural effusions. 3. Lower lumbar impingement due to spondylosis and degenerative disc disease. 4. Mild prostatomegaly. 5.  Aortic Atherosclerosis (ICD10-I70.0). 6. Descending and sigmoid colon diverticulosis. 7. Small umbilical hernia contains adipose tissue. Electronically Signed   By: Van Clines M.D.   On: 02/03/2021 15:39   DG Chest Port 1 View  Result Date: 02/03/2021 CLINICAL DATA:  Hypoxia EXAM: PORTABLE CHEST 1 VIEW COMPARISON:  January 31, 2021 FINDINGS: There is extensive airspace opacity in the right upper and mid lung regions, slightly increased. There is patchy airspace opacity in the left mid lung, stable, and in the left base, marginally increased. There is stable cardiomegaly with a degree of pulmonary venous hypertension. There is aortic atherosclerosis. No adenopathy no bone lesions. IMPRESSION: Multifocal airspace opacity, increased in the right upper and mid lung regions and subtly increased in the left base. Stable cardiac prominence with a degree of pulmonary vascular congestion. Aortic Atherosclerosis (ICD10-I70.0). Electronically Signed   By: Lowella Grip III M.D.   On: 02/03/2021 08:01    Cardiac Studies   Echo 01/24/2021  1. Left ventricular ejection fraction, by estimation, is 50 to 55%. The  left ventricle has low normal function.  Hypokinesis of septal wall. Left  ventricular diastolic parameters are consistent with Grade II diastolic  dysfunction (pseudonormalization).   2. Right ventricular systolic function is moderately reduced. The right  ventricular size is severely enlarged. Tricuspid regurgitation signal is  inadequate for assessing PA pressure.   3. Right atrial size was mildly dilated.   Patient Profile     75 y.o. male  with history of HFpEF, COPD, hypoxic respiratory failure presenting with shortness of breath, diagnosed with hypoxia and hypercapnic respiratory failure.  Being seen for HFpEF.  Assessment & Plan     1.  HFpEF -Net -2.3 L over the past 24 hours. -Continue  to hold diruretics/metolazone -Appears euvolemic on exam -Consider right heart cath Monday to eval volume status. Sob appears primarily respiratory.   2.  COPD, hypercapnic respiratory failure -BiPAP, high flow [place] -Management as per pulmonary medicine, critical care team   3. hld lipitor   Total encounter time 35 minutes  Greater than 50% was spent in counseling and coordination of care with the patient        Signed, Kate Sable, MD  02/04/2021, 11:26 AM

## 2021-02-04 NOTE — Progress Notes (Signed)
NAME:  Julian Sparks, MRN:  [id number], DOB:  [birth year], LOS: 11 ADMISSION DATE:  01/24/2021, INITIAL CONSULTATION DATE: 01/27/2021 REFERRING MD: Max Sane MD, CHIEF COMPLAINT: Worsening SOB  Brief Patient Description  75 y.o. male with PMH as below presenting to the ED worsening shortness of breath from his baseline associated with bilateral lower extremity swelling as well as increased abdominal girth for about 2 days. Has generalized anasarca +hypoxia + chronic venous stasis with ulceration and was seen at the vascular clinic where he had on arrival placed bilaterally to both lower extremities. He was admitted to PCU with Severe ACUTE Hypoxic and Hypercapnic Respiratory Failure due to acute severe decompensated combined systloic and diastolic heart failure on BiPAP  Pertinent  Medical History    Chronic bronchitis (HCC)   COPD (chronic obstructive pulmonary disease) ([place])   Diabetes mellitus without complication ([place])   Diastolic dysfunction   Dyspnea   Chronic venous statis   Hypertension   Significant Hospital Events: Including procedures, antibiotic start and stop dates in addition to other pertinent events   5/31: Admitted for acute diastolic heart failure 6/1: Resp distress and mental status changes, placed on biPAP 6/2: Respiratory status improved, weaned off BiPAP to HFNC 50% FiO2 @ 60L 6/3: Overnight with agitation and unable to tolerate BiPAP with thick tan secretions, resulting in  hypoxia (O2 sats 60's) during coughing episode, transferred to [facility] ~ now weaned back to HFNC, add IV steroids for AECOPD 6/5: Tolerated BiPAP for 3 hours last night 6/6: Remains on HHFNC (currently at 87% FiO2),BMP concerning for Contraction Metabolic Alkalosis due to aggressive Diuresis with Lasix ~ will hold Lasix and give Diamox + Metolazone; Sputum culture with Streptococcus Agalactiae ~ will start Ceftriaxone  6/7: Placed on BiPAP this morning due to SOB, CXR with new RUL alveolar  infiltrate (placed on Rocephin yesterday and sputum cx with Streptococcus Agalactiae. Will decrease Diamox to 500 mg daily, continue Metolazone 2.5 mg daily 6/8: On BiPAP overnight (50% FiO2) and tolerating, plan for HHFNC during day, diuresing well and contraction alkalosis continues to improve with Diamox + metolazone 6/9: Ceftriaxone switched to Cefazolin, remains on HHFNC (currently @ 65% FiO2), diuresing well, Creatinine remains stable, foley catheter removed 6/10: CT chest concerning for R>L multifocal opacity (consistent with pnuemonia vs. PAH), along with progression of previous abdominal adenopathy (consistent with Lymphoma); Oncology consulted, Holding diuresis due to soft BP and slight increase in serum Creatinine, decrease steroids  Cultures:  01/24/2021: SARS-CoV-2 PCR>> negative 01/24/2021: Influenza PCR>> negative 01/27/2021: MRSA PCR>> negative 01/27/2021: Sputum>> STREPTOCOCCUS AGALACTIAE  Antimicrobials:  Ceftriaxone 6/6>>6/9 Cefazolin 6/9>>  Interim History / Subjective:  -Overnight required increase in FiO2 to 80% while sleeping ~ weaned back to 60% currently -CXR this morning concerning for worsening of Pneumonia ~ CT Chest ordered~ CT chest concerning for R>L multifocal opacity (consistent with pnuemonia vs PAH), along with progression of previous abdominal adenopathy (consistent with Lymphoma) -Oncology consulted -Afebrile, BP soft this morning -UOP 3.3 L last 24 hrs (-21L since admit) -Will hold Diuresis today due to soft BP and slight increase in Cr. To 1.12 (0.88 yesterday) -Pt denies SOB, chest pain, Abdominal pain, N/V/D, fever, chills -Does report wheezing and cough, previous hemoptysis yesterday evening but no episodes so far this morning   FiO2 (%):  [50 %-60 %] 50 %  OBJECTIVE   Blood pressure (!) 120/59, pulse 70, temperature 97.9 F (36.6 C), temperature source Axillary, resp. rate (!) 25, weight 93.1 kg, SpO2 91 %.  FiO2 (%):  [50 %-60 %] 50 %    Intake/Output Summary (Last 24 hours) at 02/04/2021 2133 Last data filed at 02/04/2021 1900 Gross per 24 hour  Intake --  Output 425 ml  Net -425 ml    Filed Weights   02/01/21 0500 02/02/21 0442 02/03/21 0500  Weight: 95.5 kg 96 kg 93.1 kg    Physical Examination: GENERAL: Acute on chronically ill-appearing male, sitting up in chair , on HHFNC, no acute distress HEAD: Normocephalic, atraumatic.  EYES: Pupils equal, round, reactive to light.  No scleral icterus.  MOUTH: Moist mucosal membrane. NECK: Supple, positive JVD PULMONARY: Coarse breath sound bilaterally, no wheezing or rales, even, no accessory muscle use CARDIOVASCULAR: S1 and S2. Regular rate and rhythm. No murmurs, rubs, or gallops.  GASTROINTESTINAL: Soft, nontender, -distended. Positive bowel sounds.  MUSCULOSKELETAL: Trace edema bilateral lower extremity, no deformities, normal bulk and tone  NEUROLOGIC: Awake, alert and oriented x3, moves all extremities to command, no focal deficits, speech clear, pupils PERRLA SKIN: Limited exam- intact,warm,dry   Review of Systems: Positives in BOLD:  Gen: Denies fever, chills, weight change, fatigue, night sweats HEENT: Denies blurred vision, double vision, hearing loss, tinnitus, sinus congestion, rhinorrhea, sore throat, neck stiffness, dysphagia PULM: Denies shortness of breath, cough, sputum production, hemoptysis, wheezing CV: Denies chest pain, edema, orthopnea, paroxysmal nocturnal dyspnea, palpitations GI: Denies abdominal pain, nausea, vomiting, diarrhea, hematochezia, melena, constipation, change in bowel habits GU: Denies dysuria, hematuria, polyuria, oliguria, urethral discharge Endocrine: Denies hot or cold intolerance, polyuria, polyphagia or appetite change Derm: Denies rash, dry skin, scaling or peeling skin change Heme: Denies easy bruising, bleeding, bleeding gums Neuro: Denies headache, numbness, weakness, slurred speech, loss of memory or  consciousness   Labs/imaging that I havepersonally reviewed  (right click and "Reselect all SmartList Selections" daily)  Labs 02/03/2021: Cl 98, Bicarb 33, glucose 195, BUN 44, Cr. 1.12, WBC 10.1, Hgb 10.4,  Hct 34.5, BNP 163  CXR 6/10>>There is extensive airspace opacity in the right upper and mid lung regions, slightly increased. There is patchy airspace opacity in the left mid lung, stable, and in the left base, marginally increased. There is stable cardiomegaly with a degree of pulmonary venous hypertension. There is aortic atherosclerosis. No adenopathy no bone lesions. CT Chest w/o contrast 6/10>>1. Right greater than left multifocal airspace and ground-glass opacity, most consistent with pneumonia. 2. Tiny bilateral pleural effusions. 3. Progression of massive upper abdominal adenopathy,consistent with lymphoma. 4. Development of mild thoracic adenopathy which could be reactive or related to thoracic extension of lymphoma. 5. Aortic atherosclerosis (ICD10-I70.0), coronary artery atherosclerosis and emphysema (ICD10-J43.9). 6. Pulmonary artery enlargement suggests pulmonary arterial hypertension. 7. Interval T7 compression deformity.   Labs   CBC: Recent Labs  Lab 01/31/21 0425 02/01/21 0439 02/02/21 0501 02/03/21 0346 02/03/21 1004 02/04/21 0452  WBC 11.8* 9.8 9.6 10.1  --  11.3*  HGB 11.1* 11.0* 10.2* 9.7* 10.4* 9.6*  HCT 37.8* 36.3* 32.7* 32.0* 34.5* 31.4*  MCV 101.1* 99.2 95.9 98.2  --  97.5  PLT 227 197 190 202  --  214     Basic Metabolic Panel: Recent Labs  Lab 01/30/21 0651 01/31/21 0425 02/01/21 0439 02/02/21 0501 02/03/21 0346 02/04/21 0452  NA 140 137 138 136 138 137  K 4.9 4.3 4.1 4.0 3.8 4.8  CL 88* 94* 94* 95* 98 96*  CO2 41* 38* 34* 33* 33* 35*  GLUCOSE 209* 114* 126* 124* 195* 258*  BUN 41* 40* 39* 39* 44* 48*  CREATININE 0.89 0.79 0.86 0.88 1.12 1.09  CALCIUM 9.0 9.1 9.0 8.7* 8.6* 8.5*  MG 2.3 2.2 2.0 2.4 2.1  --   PHOS 3.4 3.4 3.8  4.2 4.3  --     GFR: Estimated Creatinine Clearance: 61.6 mL/min (by C-G formula based on SCr of 1.09 mg/dL). Recent Labs  Lab 02/01/21 0439 02/02/21 0501 02/03/21 0346 02/04/21 0452  WBC 9.8 9.6 10.1 11.3*     Liver Function Tests: No results for input(s): AST, ALT, ALKPHOS, BILITOT, PROT, ALBUMIN in the last 168 hours. No results for input(s): LIPASE, AMYLASE in the last 168 hours. No results for input(s): AMMONIA in the last 168 hours.  ABG    Component Value Date/Time   PHART 7.34 (L) 01/27/2021 0115   PCO2ART 77 (HH) 01/27/2021 0115   PO2ART 72 (L) 01/27/2021 0115   HCO3 41.5 (H) 01/27/2021 0115   O2SAT 93.3 01/27/2021 0115     Coagulation Profile: Recent Labs  Lab 02/03/21 0527  INR 1.1     Cardiac Enzymes: No results for input(s): CKTOTAL, CKMB, CKMBINDEX, TROPONINI in the last 168 hours.  HbA1C: Hgb A1c MFr Bld  Date/Time Value Ref Range Status  01/24/2021 11:32 AM 6.8 (H) 4.8 - 5.6 % Final    Comment:    (NOTE)         Prediabetes: 5.7 - 6.4         Diabetes: >6.4         Glycemic control for adults with diabetes: <7.0   02/05/2019 02:07 AM 7.3 (H) 4.8 - 5.6 % Final    Comment:    (NOTE) Pre diabetes:          5.7%-6.4% Diabetes:              >6.4% Glycemic control for   <7.0% adults with diabetes     CBG: Recent Labs  Lab 02/03/21 2324 02/04/21 0754 02/04/21 1153 02/04/21 1652 02/04/21 2102  GLUCAP 282* 254* 282* 333* 310*     Allergies Allergies  Allergen Reactions   Aspirin Nausea Only and Other (See Comments)    325 mg aspirin cause stomach upset   Penicillins Rash    Scheduled Meds:  vitamin C  500 mg Oral Daily   atorvastatin  40 mg Oral Daily   budesonide (PULMICORT) nebulizer solution  0.5 mg Nebulization BID   Chlorhexidine Gluconate Cloth  6 each Topical Q0600   fluticasone  1 spray Each Nare BID   gabapentin  300 mg Oral TID   heparin injection (subcutaneous)  5,000 Units Subcutaneous Q8H   insulin aspart   0-20 Units Subcutaneous TID AC & HS   insulin aspart  5 Units Subcutaneous TID WC   insulin aspart  7 Units Subcutaneous Once   [START ON 02/05/2021] insulin glargine  15 Units Subcutaneous Daily   ipratropium-albuterol  3 mL Nebulization Q6H   melatonin  5 mg Oral QHS   methylPREDNISolone (SOLU-MEDROL) injection  80 mg Intravenous Q8H   metoprolol succinate  12.5 mg Oral Daily   mometasone-formoterol  2 puff Inhalation BID   omega-3 acid ethyl esters  2 g Oral BID   sodium chloride flush  3 mL Intravenous Q12H   traZODone  100 mg Oral QHS   Continuous Infusions:  sodium chloride      ceFAZolin (ANCEF) IV 2 g (02/04/21 1400)   PRN Meds:.sodium chloride, acetaminophen, clonazepam, cyclobenzaprine, guaiFENesin-dextromethorphan, morphine injection, ondansetron (ZOFRAN) IV, sodium chloride flush   Resolved Hospital Problem list  ASSESSMENT & PLAN   Acute Hypoxic Hypercapnic Respiratory Failure secondary to Acute Decompensated HFpEF, AECOPD, STREPTOCOCCUS AGALACTIAE Pneumonia, &  post COVID Pneumonitis/fibrosis ? Pulmonary Alveolar Hemorrhage -Supplemental O2 as needed to maintain O2 saturations 88 to 92% -BiPAP as needed -Follow intermittent ABG and chest x-ray as needed -CT Chest 6/10 w/ R/L multifocal airspace opacity consistent with pneumonia group B strep ~decreased issues with hemoptysis,monitor -Diuresis as BP and renal function permits ~continue to hold diuretics -IV Steroids, increased SoluMedrol to 31m q 12h  -ESR, CRP, RF,nonrevealing thus far, ANCA pendidng -Bronchodilators -Budesonide nebs -Sputum culture from 01/27/21 with STREPTOCOCCUS AGALACTIAE ~ Continue Cefazolin   Acute on chronic Diastolic HFpEF  Hypertension Hx: HLD  -Continuous cardiac monitoring -Maintain MAP greater than 65 -Diuresis as BP and renal function permits ~ hold diuresis today -Hold HCTZ, metoprolol. -Continue low dose Losartan -Atorvastatin 40 mg -Repeat 2D Echocardiogram shows Left  ventricular ejection fraction, by estimation, is 50 to 55%.  Hypokinesis of septal wall. Left ventricular diastolic parameters are consistent with Grade II diastolic dysfunction (pseudonormalization).The right ventricular size is severely enlarged. -Cardiology following, appreciate input  STREPTOCOCCUS AGALACTIAE Pneumonia -Monitor fever curve -Trend WBC's -Sputum culture on 6/3 with Strep Agalactiae -Continue Cefazolin (plan for 7 day course total of ABX, to complete on 02/06/21)  Contraction Metabolic Alkalosis, suspect due to IV Lasix~ improving -Monitor I&O's / urinary output -Follow BMP -Ensure adequate renal perfusion -Avoid nephrotoxic agents as able -Replace electrolytes as indicated   Abdominal Adenopathy: Known lymphoma -Oncology consulted, appreciate input -CT Abdomen/Pelvis with contrast shows increased lymphadenopathy compared to [id number]   Acute Metabolic Encephalopathy due to Hypercapnia now improved -off Preceddex -Provide supportive care -BiPAP @ hs   Diabetes mellitus -CBGs -Sliding scale insulin & 10 units Lantus daily -Follow ICU hyper/hypoglycemia protocol -Hold home Glipizide     Best practice (right click and "Reselect all SmartList Selections" daily)  Diet:  Oral Pain/Anxiety/Delirium protocol (if indicated): No VAP protocol (if indicated): Not indicated DVT prophylaxis: Contraindicated due to hemoptysis  GI prophylaxis: PPI Glucose control:  SSI Yes Central venous access:  N/A Arterial line:  N/A Foley:  N/A Mobility:  OOB as tolerated  PT consulted: Yes Last date of multidisciplinary goals of care discussion [02/03/21] Code Status:  Limited Code (NO CPR) Disposition: Stepdown   Level 3 follow-up    Updated wife and daughter at bedside.   CRenold Don MD [facility]   *This note was dictated using voice recognition software/Dragon.  Despite best efforts to proofread, errors can occur which can change the meaning.  Any change was  purely unintentional.

## 2021-02-04 NOTE — Assessment & Plan Note (Addendum)
#  75 year old male patient with multiple medical problems including chronic respiratory failure/history of small lymphocytic lymphoma is currently admitted to hospital for worsening shortness of breath  #Small lymphocytic lymphoma-currently under surveillance-noted to have progression of disease on imaging.  Patient is currently not symptomatic; however given the bulky disease suspect patient symptomatic in the next few months.   #Acute on chronic respiratory failure/acute CHF/COPD-currently on high flow nasal oxygen  Recommendation:  #Discussed with patient and daughter regarding the concerns of progression noted on the imaging.  Patient is not significantly symptomatic at this time.  However it is quite likely that patient will become symptomatic soon from his underlying lymphoma.  I think is reasonable to consider initiation of treatment once acute issues resolve.  Discussed include ibrutinib vs anti-CD20 antibody therapies.  Again reminded the patient and family that treatment options would be recommended only after acute issues resolve/to be addressed on outpatient basis.  I do not suspect patient's current acute respiratory failure is related to his progressive lymphoma.  Thank you Dr. for allowing me to participate in the care of your pleasant patient. Please do not hesitate to contact me with questions or concerns in the interim.  Discussed with patient's daughter/granddaughter at bedside.  # I reviewed the blood work- with the patient's daughter in detail; also reviewed the imaging independently [as summarized above]; and with the patient in detail.

## 2021-02-05 ENCOUNTER — Inpatient Hospital Stay: Payer: Medicare HMO

## 2021-02-05 LAB — BASIC METABOLIC PANEL
Anion gap: 5 (ref 5–15)
BUN: 51 mg/dL — ABNORMAL HIGH (ref 8–23)
CO2: 34 mmol/L — ABNORMAL HIGH (ref 22–32)
Calcium: 8.9 mg/dL (ref 8.9–10.3)
Chloride: 100 mmol/L (ref 98–111)
Creatinine, Ser: 0.94 mg/dL (ref 0.61–1.24)
GFR, Estimated: >60 mL/min (ref >60)
Glucose, Bld: 101 mg/dL — ABNORMAL HIGH (ref 70–99)
Potassium: 4.2 mmol/L (ref 3.5–5.1)
Sodium: 139 mmol/L (ref 135–145)

## 2021-02-05 LAB — CBC
HCT: 30.3 % — ABNORMAL LOW (ref 39.0–52.0)
Hemoglobin: 9.6 g/dL — ABNORMAL LOW (ref 13.0–17.0)
MCH: 29.8 pg (ref 26.0–34.0)
MCHC: 31.7 g/dL (ref 30.0–36.0)
MCV: 94.1 fL (ref 80.0–100.0)
Platelets: 247 10*3/uL (ref 150–400)
RBC: 3.22 MIL/uL — ABNORMAL LOW (ref 4.22–5.81)
RDW: 14.7 % (ref 11.5–15.5)
WBC: 14.7 10*3/uL — ABNORMAL HIGH (ref 4.0–10.5)
nRBC: 0.1 % (ref 0.0–0.2)

## 2021-02-05 LAB — PHOSPHORUS: Phosphorus: 3.4 mg/dL (ref 2.5–4.6)

## 2021-02-05 LAB — GLUCOSE, CAPILLARY
Glucose-Capillary: 102 mg/dL — ABNORMAL HIGH (ref 70–99)
Glucose-Capillary: 167 mg/dL — ABNORMAL HIGH (ref 70–99)
Glucose-Capillary: 245 mg/dL — ABNORMAL HIGH (ref 70–99)
Glucose-Capillary: 272 mg/dL — ABNORMAL HIGH (ref 70–99)
Glucose-Capillary: 336 mg/dL — ABNORMAL HIGH (ref 70–99)
Glucose-Capillary: 369 mg/dL — ABNORMAL HIGH (ref 70–99)

## 2021-02-05 LAB — MAGNESIUM: Magnesium: 2.2 mg/dL (ref 1.7–2.4)

## 2021-02-05 MED ORDER — VANCOMYCIN HCL 1250 MG/250ML IV SOLN
1250.0000 mg | INTRAVENOUS | Status: DC
  Administered 2021-02-07: 1250 mg via INTRAVENOUS
  Filled 2021-02-05 (×2): qty 250

## 2021-02-05 MED ORDER — REVEFENACIN 175 MCG/3ML IN SOLN
175.0000 ug | Freq: Every day | RESPIRATORY_TRACT | Status: DC
  Administered 2021-02-05 – 2021-02-14 (×10): 175 ug via RESPIRATORY_TRACT
  Filled 2021-02-05 (×10): qty 3

## 2021-02-05 MED ORDER — ARFORMOTEROL TARTRATE 15 MCG/2ML IN NEBU
15.0000 ug | INHALATION_SOLUTION | Freq: Two times a day (BID) | RESPIRATORY_TRACT | Status: DC
  Administered 2021-02-05 – 2021-02-14 (×15): 15 ug via RESPIRATORY_TRACT
  Filled 2021-02-05 (×21): qty 2

## 2021-02-05 MED ORDER — ALBUTEROL SULFATE (2.5 MG/3ML) 0.083% IN NEBU
2.5000 mg | INHALATION_SOLUTION | Freq: Four times a day (QID) | RESPIRATORY_TRACT | Status: DC | PRN
  Administered 2021-02-06 – 2021-02-13 (×5): 2.5 mg via RESPIRATORY_TRACT
  Filled 2021-02-05 (×5): qty 3

## 2021-02-05 MED ORDER — INSULIN ASPART 100 UNIT/ML IJ SOLN
5.0000 [IU] | INTRAMUSCULAR | Status: DC
  Administered 2021-02-05 – 2021-02-06 (×11): 5 [IU] via SUBCUTANEOUS
  Filled 2021-02-05 (×12): qty 1

## 2021-02-05 MED ORDER — FUROSEMIDE 10 MG/ML IJ SOLN
20.0000 mg | Freq: Once | INTRAMUSCULAR | Status: AC
  Administered 2021-02-05: 20 mg via INTRAVENOUS
  Filled 2021-02-05: qty 2

## 2021-02-05 MED ORDER — INSULIN GLARGINE 100 UNIT/ML ~~LOC~~ SOLN
20.0000 [IU] | Freq: Every day | SUBCUTANEOUS | Status: DC
  Administered 2021-02-06 – 2021-02-10 (×5): 20 [IU] via SUBCUTANEOUS
  Filled 2021-02-05 (×6): qty 0.2

## 2021-02-05 MED ORDER — INSULIN ASPART 100 UNIT/ML IJ SOLN
0.0000 [IU] | INTRAMUSCULAR | Status: DC
  Administered 2021-02-05: 4 [IU] via SUBCUTANEOUS
  Administered 2021-02-05: 15 [IU] via SUBCUTANEOUS
  Administered 2021-02-05: 11 [IU] via SUBCUTANEOUS
  Administered 2021-02-05: 20 [IU] via SUBCUTANEOUS
  Administered 2021-02-05 – 2021-02-06 (×2): 7 [IU] via SUBCUTANEOUS
  Administered 2021-02-06: 3 [IU] via SUBCUTANEOUS
  Administered 2021-02-06: 11 [IU] via SUBCUTANEOUS
  Administered 2021-02-06: 4 [IU] via SUBCUTANEOUS
  Administered 2021-02-06: 3 [IU] via SUBCUTANEOUS
  Administered 2021-02-06 – 2021-02-07 (×2): 11 [IU] via SUBCUTANEOUS
  Administered 2021-02-07 (×2): 4 [IU] via SUBCUTANEOUS
  Administered 2021-02-07: 15 [IU] via SUBCUTANEOUS
  Administered 2021-02-07: 3 [IU] via SUBCUTANEOUS
  Administered 2021-02-07: 11 [IU] via SUBCUTANEOUS
  Administered 2021-02-08: 3 [IU] via SUBCUTANEOUS
  Administered 2021-02-08 (×2): 7 [IU] via SUBCUTANEOUS
  Administered 2021-02-08 – 2021-02-09 (×3): 3 [IU] via SUBCUTANEOUS
  Administered 2021-02-09: 7 [IU] via SUBCUTANEOUS
  Administered 2021-02-10: 3 [IU] via SUBCUTANEOUS
  Administered 2021-02-10: 4 [IU] via SUBCUTANEOUS
  Administered 2021-02-10: 11 [IU] via SUBCUTANEOUS
  Administered 2021-02-10: 3 [IU] via SUBCUTANEOUS
  Administered 2021-02-11: 11 [IU] via SUBCUTANEOUS
  Administered 2021-02-11: 4 [IU] via SUBCUTANEOUS
  Administered 2021-02-12: 7 [IU] via SUBCUTANEOUS
  Administered 2021-02-12: 11 [IU] via SUBCUTANEOUS
  Administered 2021-02-12 (×2): 4 [IU] via SUBCUTANEOUS
  Administered 2021-02-13: 11 [IU] via SUBCUTANEOUS
  Administered 2021-02-13: 20 [IU] via SUBCUTANEOUS
  Administered 2021-02-13: 7 [IU] via SUBCUTANEOUS
  Administered 2021-02-13: 4 [IU] via SUBCUTANEOUS
  Administered 2021-02-14: 3 [IU] via SUBCUTANEOUS
  Filled 2021-02-05 (×41): qty 1

## 2021-02-05 MED ORDER — VANCOMYCIN HCL 2000 MG/400ML IV SOLN
2000.0000 mg | Freq: Once | INTRAVENOUS | Status: AC
  Administered 2021-02-06: 2000 mg via INTRAVENOUS
  Filled 2021-02-05: qty 400

## 2021-02-05 MED ORDER — METHYLPREDNISOLONE SODIUM SUCC 40 MG IJ SOLR
40.0000 mg | Freq: Three times a day (TID) | INTRAMUSCULAR | Status: DC
  Administered 2021-02-05 – 2021-02-10 (×15): 40 mg via INTRAVENOUS
  Filled 2021-02-05 (×15): qty 1

## 2021-02-05 NOTE — Progress Notes (Signed)
Pharmacy Antibiotic Note  Julian Sparks is a 75 y.o. male admitted on 01/24/2021 with pneumonia.  Pharmacy has been consulted for Vancomycin dosing.  Plan: Vancomycin 2 gm IV X 1 ordered for 6/13 @ ~ 0000. Vancomycin 1250 mg IV Q24H ordered to start on 6/14 @ 0000.  AUC = 492.9 Vanc trough = 10.8   Weight: 93.1 kg (205 lb 4 oz)  Temp (24hrs), Avg:98.4 F (36.9 C), Min:98.4 F (36.9 C), Max:98.4 F (36.9 C)  Recent Labs  Lab 02/01/21 0439 02/02/21 0501 02/03/21 0346 02/04/21 0452 02/05/21 0417  WBC 9.8 9.6 10.1 11.3* 14.7*  CREATININE 0.86 0.88 1.12 1.09 0.94    Estimated Creatinine Clearance: 71.4 mL/min (by C-G formula based on SCr of 0.94 mg/dL).    Allergies  Allergen Reactions   Aspirin Nausea Only and Other (See Comments)    325 mg aspirin cause stomach upset   Penicillins Rash    Antimicrobials this admission:   >>    >>   Dose adjustments this admission:   Microbiology results:  BCx:   UCx:    Sputum:    MRSA PCR:   Thank you for allowing pharmacy to be a part of this patient's care.  Desarie Feild D 02/05/2021 11:40 PM

## 2021-02-05 NOTE — Progress Notes (Signed)
NAME:  Julian Sparks, MRN:  [id number], DOB:  1946-07-20, LOS: 12 ADMISSION DATE:  01/24/2021, INITIAL CONSULTATION DATE: 01/27/2021 REFERRING MD: Max Sane MD, CHIEF COMPLAINT: Worsening SOB  Brief Patient Description  75 y.o. male with PMH as below presenting to the ED worsening shortness of breath from his baseline associated with bilateral lower extremity swelling as well as increased abdominal girth for about 2 days. Has generalized anasarca +hypoxia + chronic venous stasis with ulceration and was seen at the vascular clinic where he had on arrival placed bilaterally to both lower extremities. He was admitted to PCU with Severe ACUTE Hypoxic and Hypercapnic Respiratory Failure due to acute severe decompensated combined systloic and diastolic heart failure on BiPAP  Pertinent  Medical History    Chronic bronchitis (HCC)   COPD (chronic obstructive pulmonary disease) ([place])   Diabetes mellitus without complication ([place])   Diastolic dysfunction   Dyspnea   Chronic venous statis   Hypertension   Significant Hospital Events: Including procedures, antibiotic start and stop dates in addition to other pertinent events   5/31: Admitted for acute diastolic heart failure 6/1: Resp distress and mental status changes, placed on biPAP 6/2: Respiratory status improved, weaned off BiPAP to HFNC 50% FiO2 @ 60L 6/3: Overnight with agitation and unable to tolerate BiPAP with thick tan secretions, resulting in  hypoxia (O2 sats 60's) during coughing episode, transferred to [facility] ~ now weaned back to HFNC, add IV steroids for AECOPD 6/5: Tolerated BiPAP for 3 hours last night 6/6: Remains on HHFNC (currently at 87% FiO2),BMP concerning for Contraction Metabolic Alkalosis due to aggressive Diuresis with Lasix ~ will hold Lasix and give Diamox + Metolazone; Sputum culture with Streptococcus Agalactiae ~ will start Ceftriaxone  6/7: Placed on BiPAP this morning due to SOB, CXR with new RUL alveolar  infiltrate (placed on Rocephin yesterday and sputum cx with Streptococcus Agalactiae. Will decrease Diamox to 500 mg daily, continue Metolazone 2.5 mg daily 6/8: On BiPAP overnight (50% FiO2) and tolerating, plan for HHFNC during day, diuresing well and contraction alkalosis continues to improve with Diamox + metolazone 6/9: Ceftriaxone switched to Cefazolin, remains on HHFNC (currently @ 65% FiO2), diuresing well, Creatinine remains stable, foley catheter removed 6/10: CT chest concerning for R>L multifocal opacity (consistent with pnuemonia vs. PAH), along with progression of previous abdominal adenopathy (consistent with Lymphoma); Oncology consulted, Holding diuresis due to soft BP and slight increase in serum Creatinine, decrease steroids  Cultures:  01/24/2021: SARS-CoV-2 PCR>> negative 01/24/2021: Influenza PCR>> negative 01/27/2021: MRSA PCR>> negative 01/27/2021: Sputum>> STREPTOCOCCUS AGALACTIAE  Antimicrobials:  Ceftriaxone 6/6>>6/9 Cefazolin 6/9>>  Interim History / Subjective:  -Overnight required increase in FiO2 to 90% while sleeping now 65% -CXR 6/10 concerning for worsening of Pneumonia ~ CT Chest ordered~ CT chest concerning for R>L multifocal opacity (consistent with pnuemonia vs PAH), along with progression of previous abdominal adenopathy (consistent with Lymphoma) -Oncology to follow as opt -WBC up -UOP 3.3 L last 24 hrs (-21L since admit) -Lasix today -Pt denies SOB, chest pain, Abdominal pain, N/V/D, fever, chills -Does report wheezing and cough, more hemoptysis today -not candidate for bronch due to high fiO2 req   FiO2 (%):  [50 %-65 %] 65 %  OBJECTIVE   Blood pressure 115/71, pulse 69, temperature 98.2 F (36.8 C), temperature source Axillary, resp. rate 20, weight 93.1 kg, SpO2 92 %.    FiO2 (%):  [50 %-65 %] 65 %   Intake/Output Summary (Last 24 hours) at 02/05/2021 2314 Last data  filed at 02/05/2021 1800 Gross per 24 hour  Intake 3104.33 ml  Output 2075  ml  Net 1029.33 ml    Filed Weights   02/01/21 0500 02/02/21 0442 02/03/21 0500  Weight: 95.5 kg 96 kg 93.1 kg    Physical Examination: GENERAL: Acute on chronically ill-appearing male, sitting up in chair , on HHFNC, no acute distress HEAD: Normocephalic, atraumatic.  EYES: Pupils equal, round, reactive to light.  No scleral icterus.  MOUTH: Moist mucosal membrane. NECK: Supple, positive JVD PULMONARY: Coarse breath sounds bilaterally, wheezing today,no rales, even, no accessory muscle use CARDIOVASCULAR: S1 and S2. Regular rate and rhythm. No murmurs, rubs, or gallops.  GASTROINTESTINAL: Soft, nontender, -distended. Positive bowel sounds.  MUSCULOSKELETAL: Trace edema bilateral lower extremity, no deformities, normal bulk and tone  NEUROLOGIC: Awake, alert and oriented x3, moves all extremities to command, no focal deficits, speech clear, pupils PERRLA SKIN: Limited exam- intact,warm,dry   Review of Systems:  A 10 point review of systems was performed and it is as noted above otherwise negative.  Labs/imaging that I havepersonally reviewed  (right click and "Reselect all SmartList Selections" daily)  Labs 02/03/2021: Cl 98, Bicarb 33, glucose 195, BUN 44, Cr. 1.12, WBC 10.1, Hgb 10.4,  Hct 34.5, BNP 163  CXR 6/10>>There is extensive airspace opacity in the right upper and mid lung regions, slightly increased. There is patchy airspace opacity in the left mid lung, stable, and in the left base, marginally increased. There is stable cardiomegaly with a degree of pulmonary venous hypertension. There is aortic atherosclerosis. No adenopathy no bone lesions. CT Chest w/o contrast 6/10>>1. Right greater than left multifocal airspace and ground-glass opacity, most consistent with pneumonia. 2. Tiny bilateral pleural effusions. 3. Progression of massive upper abdominal adenopathy,consistent with lymphoma. 4. Development of mild thoracic adenopathy which could be reactive or related  to thoracic extension of lymphoma. 5. Aortic atherosclerosis (ICD10-I70.0), coronary artery atherosclerosis and emphysema (ICD10-J43.9). 6. Pulmonary artery enlargement suggests pulmonary arterial hypertension. 7. Interval T7 compression deformity.   Labs   CBC: Recent Labs  Lab 02/01/21 0439 02/02/21 0501 02/03/21 0346 02/03/21 1004 02/04/21 0452 02/05/21 0417  WBC 9.8 9.6 10.1  --  11.3* 14.7*  HGB 11.0* 10.2* 9.7* 10.4* 9.6* 9.6*  HCT 36.3* 32.7* 32.0* 34.5* 31.4* 30.3*  MCV 99.2 95.9 98.2  --  97.5 94.1  PLT 197 190 202  --  214 247     Basic Metabolic Panel: Recent Labs  Lab 01/31/21 0425 02/01/21 0439 02/02/21 0501 02/03/21 0346 02/04/21 0452 02/05/21 0417  NA 137 138 136 138 137 139  K 4.3 4.1 4.0 3.8 4.8 4.2  CL 94* 94* 95* 98 96* 100  CO2 38* 34* 33* 33* 35* 34*  GLUCOSE 114* 126* 124* 195* 258* 101*  BUN 40* 39* 39* 44* 48* 51*  CREATININE 0.79 0.86 0.88 1.12 1.09 0.94  CALCIUM 9.1 9.0 8.7* 8.6* 8.5* 8.9  MG 2.2 2.0 2.4 2.1  --  2.2  PHOS 3.4 3.8 4.2 4.3  --  3.4    GFR: Estimated Creatinine Clearance: 71.4 mL/min (by C-G formula based on SCr of 0.94 mg/dL). Recent Labs  Lab 02/02/21 0501 02/03/21 0346 02/04/21 0452 02/05/21 0417  WBC 9.6 10.1 11.3* 14.7*     Liver Function Tests: No results for input(s): AST, ALT, ALKPHOS, BILITOT, PROT, ALBUMIN in the last 168 hours. No results for input(s): LIPASE, AMYLASE in the last 168 hours. No results for input(s): AMMONIA in the last 168 hours.  ABG  Component Value Date/Time   PHART 7.34 (L) 01/27/2021 0115   PCO2ART 77 (HH) 01/27/2021 0115   PO2ART 72 (L) 01/27/2021 0115   HCO3 41.5 (H) 01/27/2021 0115   O2SAT 93.3 01/27/2021 0115     Coagulation Profile: Recent Labs  Lab 02/03/21 0527  INR 1.1     Cardiac Enzymes: No results for input(s): CKTOTAL, CKMB, CKMBINDEX, TROPONINI in the last 168 hours.  HbA1C: Hgb A1c MFr Bld  Date/Time Value Ref Range Status  01/24/2021 11:32  AM 6.8 (H) 4.8 - 5.6 % Final    Comment:    (NOTE)         Prediabetes: 5.7 - 6.4         Diabetes: >6.4         Glycemic control for adults with diabetes: <7.0   02/05/2019 02:07 AM 7.3 (H) 4.8 - 5.6 % Final    Comment:    (NOTE) Pre diabetes:          5.7%-6.4% Diabetes:              >6.4% Glycemic control for   <7.0% adults with diabetes     CBG: Recent Labs  Lab 02/05/21 0358 02/05/21 0749 02/05/21 1112 02/05/21 1542 02/05/21 2004  GLUCAP 102* 167* 245* 336* 369*     Allergies Allergies  Allergen Reactions   Aspirin Nausea Only and Other (See Comments)    325 mg aspirin cause stomach upset   Penicillins Rash    Scheduled Meds:  arformoterol  15 mcg Nebulization BID   vitamin C  500 mg Oral Daily   atorvastatin  40 mg Oral Daily   budesonide (PULMICORT) nebulizer solution  0.5 mg Nebulization BID   Chlorhexidine Gluconate Cloth  6 each Topical Q0600   fluticasone  1 spray Each Nare BID   gabapentin  300 mg Oral TID   insulin aspart  0-20 Units Subcutaneous Q4H   insulin aspart  5 Units Subcutaneous Q4H   [START ON 02/06/2021] insulin glargine  20 Units Subcutaneous Daily   melatonin  5 mg Oral QHS   methylPREDNISolone (SOLU-MEDROL) injection  40 mg Intravenous Q8H   metoprolol succinate  12.5 mg Oral Daily   revefenacin  175 mcg Nebulization Daily   sodium chloride flush  3 mL Intravenous Q12H   traZODone  100 mg Oral QHS   Continuous Infusions:  sodium chloride      ceFAZolin (ANCEF) IV 2 g (02/05/21 1400)   PRN Meds:.sodium chloride, acetaminophen, albuterol, clonazepam, cyclobenzaprine, morphine injection, ondansetron (ZOFRAN) IV, sodium chloride flush   Resolved Hospital Problem list     ASSESSMENT & PLAN   Acute Hypoxic Hypercapnic Respiratory Failure secondary to Acute Decompensated HFpEF, AECOPD, STREPTOCOCCUS AGALACTIAE Pneumonia, &  post COVID Pneumonitis/fibrosis Hemoptysis ? Diffuse Alveolar Hemorrhage -Supplemental O2 as needed to  maintain O2 saturations 88 to 92% -BiPAP as needed -Follow intermittent ABG and chest x-ray as needed -CT Chest 6/10 w/ R/L multifocal airspace opacity consistent with pneumonia group B strep increased WBC and persistent hemoptysis ? resistance -Diuresis as BP and renal function permits ~Lasix today -IV Steroids, decreased SoluMedrol to 64m q 12h  -ESR, CRP, RF,nonrevealing thus far, ANCA pendidng -Brovana BID, Yupelri q d, PRN albuterol, Budesonide BID -Sputum culture from 01/27/21 with STREPTOCOCCUS AGALACTIAE ~ -Continue Cefazolin, add Vanco ? Resistance   Acute on chronic Diastolic HFpEF  Hypertension Hx: HLD  -Continuous cardiac monitoring -Maintain MAP greater than 65 -Diuresis as BP and renal function permits ~ hold diuresis  today -Hold HCTZ, metoprolol. -Continue low dose Losartan -Atorvastatin 40 mg -Repeat 2D Echocardiogram shows Left ventricular ejection fraction, by estimation, is 50 to 55%.  Hypokinesis of septal wall. Left ventricular diastolic parameters are consistent with Grade II diastolic dysfunction (pseudonormalization).The right ventricular size is severely enlarged. -Cardiology following, appreciate input  STREPTOCOCCUS AGALACTIAE Pneumonia -Monitor fever curve -Trend WBC's -Sputum culture on 6/3 with Strep Agalactiae -Continue Cefazolin add Vanco -Recollect sputum, CXR in AM  Contraction Metabolic Alkalosis, suspect due to IV Lasix~ improving -Monitor I&O's / urinary output -Follow BMP -Ensure adequate renal perfusion -Avoid nephrotoxic agents as able -Replace electrolytes as indicated   Abdominal Adenopathy: Known lymphoma -Oncology consulted, appreciate input -CT Abdomen/Pelvis with contrast shows increased lymphadenopathy compared to 2020  Anemia Chronic disease+acute blood loss  Monitor No anticoagulants SCDs    Acute Metabolic Encephalopathy due to Hypercapnia now improved -off Preceddex -Provide supportive care -BiPAP @ hs   Diabetes  mellitus -CBGs -Sliding scale insulin & 10 units Lantus daily -Follow ICU hyper/hypoglycemia protocol -Hold home Glipizide     Best practice (right click and "Reselect all SmartList Selections" daily)  Diet:  Oral Pain/Anxiety/Delirium protocol (if indicated): No VAP protocol (if indicated): Not indicated DVT prophylaxis: Contraindicated due to hemoptysis  GI prophylaxis: PPI Glucose control:  SSI Yes Central venous access:  N/A Arterial line:  N/A Foley:  N/A Mobility:  OOB as tolerated  PT consulted: Yes Last date of multidisciplinary goals of care discussion [02/03/21] Code Status:  Limited Code (NO CPR) Disposition: Stepdown   Level 3 follow-up    Updated wife and daughter at bedside.   Renold Don, MD [facility]   *This note was dictated using voice recognition software/Dragon.  Despite best efforts to proofread, errors can occur which can change the meaning.  Any change was purely unintentional.

## 2021-02-05 NOTE — Progress Notes (Signed)
Physical Therapy Treatment Patient Details Name: Julian Sparks MRN: [id number] DOB: [birth year] Today's Date: 02/05/2021    History of Present Illness presented to ER secondary to progressive SOB, LE and abdominal edema; admitted for management of acute/chronic hypoxic, hypercapnic respiratory failure due to CHF/COPD.    PT Comments    Pt seen for PT tx with family arriving shortly after start of session. Pt eager to get OOB to recliner & is able to complete bed mobility with supervision with [facility] elevated & bed rails. Pt completes sit<>stand & stand pivot transfer with RW & min assist. Pt is able to perform sitting & standing BLE strengthening exercises with instructional cuing for technique. Respiratory therapist entered room & agreeable to assisting pt with gait. Pt is able to ambulate 25 ft to door with RW & min assist with multiple people assisting with line management. SpO2 dropped to 82% at end of gait & rest break provided. Pt able to recover to 89% with pursed lip breathing & seated rest break. Pt reports he is consistently using incentive spirometer & acapella flutter valve. Pt would continue to benefit from acute PT services to progress gait & activity tolerance as able.  Pt on 65 L/min HHFNC throughout session with respiratory therapist present throughout gait & assisting with O2 monitoring.     Follow Up Recommendations  SNF     Equipment Recommendations  None recommended by PT    Recommendations for Other Services       Precautions / Restrictions Precautions Precautions: Fall Restrictions Weight Bearing Restrictions: No    Mobility  Bed Mobility Overal bed mobility: Needs Assistance Bed Mobility: Supine to Sit     Supine to sit: Supervision;HOB elevated          Transfers Overall transfer level: Needs assistance Equipment used: Rolling walker (2 wheeled) Transfers: Sit to/from [facility] Sit to Stand: Min assist Stand pivot transfers: Min  assist          Ambulation/Gait Ambulation/Gait assistance: Min assist;+2 safety/equipment Gait Distance (Feet): 25 Feet Assistive device: Rolling walker (2 wheeled) Gait Pattern/deviations: Decreased step length - left;Decreased step length - right;Decreased stride length Gait velocity: decreased       Stairs             Wheelchair Mobility    Modified Rankin (Stroke Patients Only)       Balance Overall balance assessment: Needs assistance Sitting-balance support: No upper extremity supported;Feet supported Sitting balance-Leahy Scale: Good     Standing balance support: Bilateral upper extremity supported;During functional activity Standing balance-Leahy Scale: Fair                              Cognition Arousal/Alertness: Awake/alert Behavior During Therapy: WFL for tasks assessed/performed Overall Cognitive Status: Within Functional Limits for tasks assessed                                 General Comments: Very motivated to participate, eager to sit in recliner      Exercises General Exercises - Lower Extremity Long Arc Quad: AROM;Strengthening;Both;10 reps;Seated Hip Flexion/Marching: AROM;Strengthening;Both;10 reps;Seated (BUE support on RW)    General Comments        Pertinent Vitals/Pain Pain Assessment: No/denies pain    Home Living  Prior Function            PT Goals (current goals can now be found in the care plan section) Acute Rehab PT Goals Patient Stated Goal: to get stronger and get back home! PT Goal Formulation: With patient/family Time For Goal Achievement: 02/15/21 Potential to Achieve Goals: Good Progress towards PT goals: Progressing toward goals    Frequency    Min 2X/week      PT Plan Current plan remains appropriate    Co-evaluation              AM-PAC PT "6 Clicks" Mobility   Outcome Measure  Help needed turning from your back to your side  while in a flat bed without using bedrails?: None Help needed moving from lying on your back to sitting on the side of a flat bed without using bedrails?: A Little Help needed moving to and from a bed to a chair (including a wheelchair)?: A Little Help needed standing up from a chair using your arms (e.g., wheelchair or bedside chair)?: A Little Help needed to walk in hospital room?: A Little Help needed climbing 3-5 steps with a railing? : A Lot 6 Click Score: 18    End of Session Equipment Utilized During Treatment: Oxygen;Gait belt Activity Tolerance: Patient tolerated treatment well Patient left: in chair;with call bell/phone within reach;with family/visitor present Nurse Communication: Mobility status PT Visit Diagnosis: Difficulty in walking, not elsewhere classified (R26.2);Muscle weakness (generalized) (M62.81)     Time: 7681-1572 PT Time Calculation (min) (ACUTE ONLY): 27 min  Charges:  $Therapeutic Activity: 23-37 mins                     Lavone Nian, PT, DPT 02/05/21, 4:20 PM    Waunita Schooner 02/05/2021, 4:16 PM

## 2021-02-05 NOTE — Progress Notes (Signed)
Progress Note  Patient Name: Julian Sparks Date of Encounter: 02/05/2021  [facility] Cardiologist: Dr. Rockey Situ  Subjective   No acute events overnight, requiring higher oxygenation this morning via nasal oxygen.  Sitting upright in bed.  Daughter and granddaughter at bedside.  Net -1.4 L over the past 24 hours.  Inpatient Medications    Scheduled Meds:  vitamin C  500 mg Oral Daily   atorvastatin  40 mg Oral Daily   budesonide (PULMICORT) nebulizer solution  0.5 mg Nebulization BID   Chlorhexidine Gluconate Cloth  6 each Topical Q0600   fluticasone  1 spray Each Nare BID   gabapentin  300 mg Oral TID   heparin injection (subcutaneous)  5,000 Units Subcutaneous Q8H   insulin aspart  0-20 Units Subcutaneous Q4H   insulin aspart  5 Units Subcutaneous Q4H   insulin glargine  15 Units Subcutaneous Daily   ipratropium-albuterol  3 mL Nebulization Q6H   melatonin  5 mg Oral QHS   methylPREDNISolone (SOLU-MEDROL) injection  80 mg Intravenous Q8H   metoprolol succinate  12.5 mg Oral Daily   mometasone-formoterol  2 puff Inhalation BID   omega-3 acid ethyl esters  2 g Oral BID   sodium chloride flush  3 mL Intravenous Q12H   traZODone  100 mg Oral QHS   Continuous Infusions:  sodium chloride      ceFAZolin (ANCEF) IV 2 g (02/05/21 0602)   PRN Meds: sodium chloride, acetaminophen, clonazepam, cyclobenzaprine, guaiFENesin-dextromethorphan, morphine injection, ondansetron (ZOFRAN) IV, sodium chloride flush   Vital Signs    Vitals:   02/05/21 0800 02/05/21 0804 02/05/21 0900 02/05/21 0945  BP:    (!) 148/78  Pulse: 83  83 89  Resp: 19  16   Temp:      TempSrc:      SpO2: 91% 91% (!) 80%   Weight:        Intake/Output Summary (Last 24 hours) at 02/05/2021 1130 Last data filed at 02/05/2021 0900 Gross per 24 hour  Intake 565.26 ml  Output 2150 ml  Net -1584.74 ml   Last 3 Weights 02/03/2021 02/02/2021 02/01/2021  Weight (lbs) 205 lb 4 oz 211 lb 10.3 oz 210 lb 8.6 oz   Weight (kg) 93.1 kg 96 kg 95.5 kg      Telemetry    Sinus rhythm- Personally Reviewed  ECG    No new tracing obtained- Personally Reviewed  Physical Exam   GEN:  Mild respiratory distress Neck: No JVD Cardiac: RRR Respiratory:  Rhonchorous breath sounds GI: Soft, nontender, non-distended MS: No edema; No deformity. Neuro:  Nonfocal Psych: Normal affect  Labs    High Sensitivity Troponin:   Recent Labs  Lab 01/24/21 1132  TROPONINIHS 13      Chemistry Recent Labs  Lab 02/03/21 0346 02/04/21 0452 02/05/21 0417  NA 138 137 139  K 3.8 4.8 4.2  CL 98 96* 100  CO2 33* 35* 34*  GLUCOSE 195* 258* 101*  BUN 44* 48* 51*  CREATININE 1.12 1.09 0.94  CALCIUM 8.6* 8.5* 8.9  GFRNONAA >60 >60 >60  ANIONGAP 7 6 5      Hematology Recent Labs  Lab 02/03/21 0346 02/03/21 1004 02/04/21 0452 02/05/21 0417  WBC 10.1  --  11.3* 14.7*  RBC 3.26*  --  3.22* 3.22*  HGB 9.7* 10.4* 9.6* 9.6*  HCT 32.0* 34.5* 31.4* 30.3*  MCV 98.2  --  97.5 94.1  MCH 29.8  --  29.8 29.8  MCHC 30.3  --  30.6 31.7  RDW 14.6  --  14.4 14.7  PLT 202  --  214 247    BNP Recent Labs  Lab 02/03/21 0346  BNP 163.9*     DDimer No results for input(s): DDIMER in the last 168 hours.   Radiology    CT ABDOMEN PELVIS W CONTRAST  Result Date: 02/03/2021 CLINICAL DATA:  Chronic lymphocytic leukemia/small lymphocytic lymphoma. EXAM: CT ABDOMEN AND PELVIS WITH CONTRAST TECHNIQUE: Multidetector CT imaging of the abdomen and pelvis was performed using the standard protocol following bolus administration of intravenous contrast. CONTRAST:  144mL OMNIPAQUE IOHEXOL 300 MG/ML  SOLN COMPARISON:  PET-CT 03/06/2019 FINDINGS: Lower chest: Consolidation posteriorly in the right lower lobe with airspace opacity posteriorly in the right middle lobe and hazy ground-glass opacities in the lingula. Confluent bandlike volume loss centrally in the left lower lobe extending to the lung base, these findings are  generally similar to the chest CT from this morning. Trace bilateral pleural effusions. Mild cardiomegaly. Right coronary artery and descending thoracic aortic atherosclerosis. Hepatobiliary: 3 mm hypodense lesion in the left hepatic lobe on image 14 series 2 is technically too small to characterize although statistically likely to be benign. Gallbladder unremarkable. No biliary dilatation. Pancreas: Unremarkable Spleen: No splenomegaly or focal splenic lesion. Adrenals/Urinary Tract: Small amount of gas in the urinary bladder, query recent catheterization. Otherwise unremarkable. Stomach/Bowel: Descending and sigmoid colon diverticulosis. Vascular/Lymphatic: Aortoiliac atherosclerotic vascular disease. Compared to PET-CT of 03/06/2019 there is substantial increase in bulky adenopathy along the gastrohepatic ligament/celiac chain and in the retroperitoneum. A celiac node measures 5.4 cm in short axis on image 25 series 2, previously 2.1 cm. The retroperitoneal adenopathy now forms and encasing rind which displaces the aorta from the spine. The analogous portion of this rind to a previously 2.5 cm in short axis right retroperitoneal lymph node currently measures 4.7 cm. The conglomerate adenopathy in the periaortic region in total measures about 14.5 by 7.1 by 16.5 cm. This adenopathy extends down to the common iliac level but not into the lower pelvis. Reproductive: The prostate gland measures 5.5 by 3.8 by 5.3 cm (volume = 58 cm^3), compatible with mild prostatomegaly. Other: No supplemental non-categorized findings. Musculoskeletal: Callus formation compatible with late phase healing of left seventh and eighth lateral rib fractures. Transitional S1 vertebra. Degenerative disc disease at L5-S1 thought to likely be causing bilateral foraminal impingement and central narrowing of the thecal sac. Disc bulge and facet arthropathy at L3-4 and L4-5 likely cause mild bilateral impingement at L4-5 and mild right foraminal  impingement at L3-4. Small umbilical hernia contains adipose tissue. IMPRESSION: 1. Substantial increase in bulky celiac and retroperitoneal adenopathy compared to the 03/06/2019 PET-CT. Appearance compatible with limb for proliferative process. 2. Stable basilar airspace opacities, right greater than left, with stable trace bilateral pleural effusions. 3. Lower lumbar impingement due to spondylosis and degenerative disc disease. 4. Mild prostatomegaly. 5.  Aortic Atherosclerosis (ICD10-I70.0). 6. Descending and sigmoid colon diverticulosis. 7. Small umbilical hernia contains adipose tissue. Electronically Signed   By: Van Clines M.D.   On: 02/03/2021 15:39    Cardiac Studies   Echo 01/24/2021  1. Left ventricular ejection fraction, by estimation, is 50 to 55%. The  left ventricle has low normal function. Hypokinesis of septal wall. Left  ventricular diastolic parameters are consistent with Grade II diastolic  dysfunction (pseudonormalization).   2. Right ventricular systolic function is moderately reduced. The right  ventricular size is severely enlarged. Tricuspid regurgitation signal is  inadequate for  assessing PA pressure.   3. Right atrial size was mildly dilated.   Patient Profile     75 y.o. male  with history of HFpEF, COPD, hypoxic respiratory failure presenting with shortness of breath, diagnosed with hypoxia and hypercapnic respiratory failure.  Being seen for HFpEF.  Assessment & Plan     1.  HFpEF -Net -1.4 L over the past 24 hours. -Continue  to hold diruretics/metolazone -Appears euvolemic on exam -Consider right heart cath tuesday to eval volume status.? Contraction alkalosis.  Consider right heart cath to evaluate volume status.  (Currently requiring increased oxygenation via high flow nasal cannula today.  Also sitting upright.  Not respiratory optimized for procedure/rhc tomorrow).   2.  COPD, hypercapnic respiratory failure -BiPAP, high flow [place] -Management as  per icu team   3. hld lipitor   Total encounter time 35 minutes  Greater than 50% was spent in counseling and coordination of care with the patient        Signed, Kate Sable, MD  02/05/2021, 11:30 AM

## 2021-02-06 ENCOUNTER — Encounter
Admission: EM | Disposition: A | Discharge status: Home Health | Payer: Self-pay | Source: Home / Self Care | Attending: Internal Medicine

## 2021-02-06 ENCOUNTER — Inpatient Hospital Stay: Payer: Medicare HMO

## 2021-02-06 LAB — PAN-ANCA
ANCA Proteinase 3: <3.5 U/mL (ref 0.0–3.5)
Atypical P-ANCA titer: <1:20 {titer}
C-ANCA: <1:20 {titer}
Myeloperoxidase Abs: <9 U/mL (ref 0.0–9.0)
P-ANCA: <1:20 {titer}

## 2021-02-06 LAB — CBC
HCT: 30.1 % — ABNORMAL LOW (ref 39.0–52.0)
Hemoglobin: 9.3 g/dL — ABNORMAL LOW (ref 13.0–17.0)
MCH: 29.7 pg (ref 26.0–34.0)
MCHC: 30.9 g/dL (ref 30.0–36.0)
MCV: 96.2 fL (ref 80.0–100.0)
Platelets: 261 10*3/uL (ref 150–400)
RBC: 3.13 MIL/uL — ABNORMAL LOW (ref 4.22–5.81)
RDW: 15 % (ref 11.5–15.5)
WBC: 14.4 10*3/uL — ABNORMAL HIGH (ref 4.0–10.5)
nRBC: 0 % (ref 0.0–0.2)

## 2021-02-06 LAB — EXPECTORATED SPUTUM ASSESSMENT W GRAM STAIN, RFLX TO RESP C

## 2021-02-06 LAB — BASIC METABOLIC PANEL
Anion gap: 5 (ref 5–15)
BUN: 54 mg/dL — ABNORMAL HIGH (ref 8–23)
CO2: 37 mmol/L — ABNORMAL HIGH (ref 22–32)
Calcium: 8.6 mg/dL — ABNORMAL LOW (ref 8.9–10.3)
Chloride: 98 mmol/L (ref 98–111)
Creatinine, Ser: 0.89 mg/dL (ref 0.61–1.24)
GFR, Estimated: >60 mL/min (ref >60)
Glucose, Bld: 123 mg/dL — ABNORMAL HIGH (ref 70–99)
Potassium: 4.4 mmol/L (ref 3.5–5.1)
Sodium: 140 mmol/L (ref 135–145)

## 2021-02-06 LAB — GLUCOSE, CAPILLARY
Glucose-Capillary: 105 mg/dL — ABNORMAL HIGH (ref 70–99)
Glucose-Capillary: 123 mg/dL — ABNORMAL HIGH (ref 70–99)
Glucose-Capillary: 138 mg/dL — ABNORMAL HIGH (ref 70–99)
Glucose-Capillary: 163 mg/dL — ABNORMAL HIGH (ref 70–99)
Glucose-Capillary: 245 mg/dL — ABNORMAL HIGH (ref 70–99)
Glucose-Capillary: 277 mg/dL — ABNORMAL HIGH (ref 70–99)
Glucose-Capillary: 293 mg/dL — ABNORMAL HIGH (ref 70–99)

## 2021-02-06 LAB — MAGNESIUM: Magnesium: 2.3 mg/dL (ref 1.7–2.4)

## 2021-02-06 LAB — PHOSPHORUS: Phosphorus: 3.7 mg/dL (ref 2.5–4.6)

## 2021-02-06 SURGERY — RIGHT HEART CATH
Anesthesia: Moderate Sedation

## 2021-02-06 MED ORDER — FUROSEMIDE 20 MG PO TABS
40.0000 mg | ORAL_TABLET | Freq: Every day | ORAL | Status: DC
  Administered 2021-02-06 – 2021-02-10 (×5): 40 mg via ORAL
  Filled 2021-02-06 (×5): qty 2

## 2021-02-06 NOTE — Progress Notes (Signed)
Patient alert and oriented x 4. Patient has remained afebrile. Patient states that he has no pain. While in patient room, patient asked to sit on the side of the bed. Patient oxygen saturation dropped to 84% on high flow. Patient was exhibiting signs of air hunger. I sat the patient all the way up in bed. I called respiratory. I called nurse practitioner Domingo Pulse. The respiratory therapist placed the patient on BIPAP. I asked the respiratory therapist to give the patient his PRN nebulizer treatment. Nurse practitioner asked me to give the patient his PRN morphine to combat the jitteriness that comes with taking albuterol. Patient has been sleeping since receiving the morphine and nebulizer treatment. Patient oxygen saturation has maintained in the mid 90's since being placed on the BIPAP. Will continue patient care.

## 2021-02-06 NOTE — NC FL2 (Signed)
[place] LEVEL OF CARE SCREENING TOOL     IDENTIFICATION  Patient Name: Julian Sparks Birthdate: Jul 17, 1946 Sex: male Admission Date (Current Location): 01/24/2021  [facility] and [place] Number:  Engineering geologist and Address:  [facility], [address] [place], [place] [address]      Provider Number: [id number]  Attending Physician Name and Address:  Ottie Glazier, MD  Relative Name and Phone Number:  Lenise Arena (Daughter)   [phone] [facility])    Current Level of Care: Hospital Recommended Level of Care: [place] Prior Approval Number:    Date Approved/Denied:   PASRR Number: [id number]  Discharge Plan: SNF    Current Diagnoses: Patient Active Problem List   Diagnosis Date Noted   Lymphoma, small lymphocytic ([place]) 02/04/2021   Acute cor pulmonale (HCC)    Acute on chronic congestive heart failure ([place])    Acute on chronic diastolic CHF (congestive heart failure) ([place]) 01/24/2021   Type 2 diabetes mellitus without complication ([place]) 61/60/7371   Acute on chronic respiratory failure ([place]) 01/24/2021   Hyperkalemia 01/24/2021   Obesity (BMI 30-39.9) 01/24/2021   Shortness of breath 10/26/2020   Essential hypertension 07/13/2020   Cellulitis of right lower extremity 07/13/2020   Need for influenza vaccination 05/11/2020   Chronic respiratory failure with hypoxia ([place]) 04/27/2020   Bronchitis 04/27/2020   Pleurodynia 04/27/2020   Obesity (BMI 30.0-34.9) 02/17/2020   Diabetes 1.5, managed as type 2 ([place]) 02/17/2020   Adhesive capsulitis of both shoulders 02/17/2020   Edema 07/19/2019   Leg swelling 07/19/2019   Diminished pulses in lower extremity 07/19/2019   Bursitis of shoulder 04/03/2019   Respiratory failure ([place]) 02/04/2019   Full thickness rotator cuff tear 05/23/2016    Orientation RESPIRATION BLADDER Height & Weight     Self, Time, Place, Situation  Normal Continent Weight: 205 lb 4  oz (93.1 kg) Height:     BEHAVIORAL SYMPTOMS/MOOD NEUROLOGICAL BOWEL NUTRITION STATUS      Continent Diet  AMBULATORY STATUS COMMUNICATION OF NEEDS Skin   Limited Assist Verbally Normal                       Personal Care Assistance Level of Assistance  Bathing, Feeding, Dressing, Total care Bathing Assistance: Limited assistance Feeding assistance: Limited assistance Dressing Assistance: Limited assistance     Functional Limitations Info  Speech, Hearing, Sight Sight Info: Adequate Hearing Info: Adequate Speech Info: Adequate    SPECIAL CARE FACTORS FREQUENCY       PT 5x per week OT 5x per week                Contractures Contractures Info: Not present    Additional Factors Info                  Current Medications (02/06/2021):  This is the current hospital active medication list Current Facility-Administered Medications  Medication Dose Route Frequency Provider Last Rate Last Admin   0.9 %  sodium chloride infusion  250 mL Intravenous PRN Agbata, Tochukwu, MD       acetaminophen (TYLENOL) tablet 650 mg  650 mg Oral Q4H PRN Agbata, Tochukwu, MD   650 mg at 02/02/21 2102   albuterol (PROVENTIL) (2.5 MG/3ML) 0.083% nebulizer solution 2.5 mg  2.5 mg Nebulization Q6H PRN Tyler Pita, MD   2.5 mg at 02/06/21 0118   arformoterol (BROVANA) nebulizer solution 15 mcg  15 mcg Nebulization BID Vernard Gambles  L, MD   15 mcg at 02/06/21 4580   ascorbic acid (VITAMIN C) tablet 500 mg  500 mg Oral Daily Agbata, Tochukwu, MD   500 mg at 02/06/21 0901   atorvastatin (LIPITOR) tablet 40 mg  40 mg Oral Daily Agbata, Tochukwu, MD   40 mg at 02/06/21 0900   budesonide (PULMICORT) nebulizer solution 0.5 mg  0.5 mg Nebulization BID Flora Lipps, MD   0.5 mg at 02/06/21 0830   Chlorhexidine Gluconate Cloth 2 % PADS 6 each  6 each Topical [id number] Flora Lipps, MD   6 each at 02/04/21 1224   clonazePAM (KLONOPIN) disintegrating tablet 0.25 mg  0.25 mg Oral BID PRN Flora Lipps, MD   0.25 mg at 02/03/21 1816   cyclobenzaprine (FLEXERIL) tablet 10 mg  10 mg Oral QHS PRN Agbata, Tochukwu, MD   10 mg at 01/26/21 1158   fluticasone (FLONASE) 50 MCG/ACT nasal spray 1 spray  1 spray Each Nare BID Agbata, Tochukwu, MD   1 spray at 02/06/21 0901   furosemide (LASIX) tablet 40 mg  40 mg Oral Daily Kathlyn Sacramento A, MD   40 mg at 02/06/21 1453   gabapentin (NEURONTIN) capsule 300 mg  300 mg Oral TID Agbata, Tochukwu, MD   300 mg at 02/06/21 1600   insulin aspart (novoLOG) injection 0-20 Units  0-20 Units Subcutaneous Q4H Rust-Chester, Britton L, NP   11 Units at 02/06/21 1600   insulin aspart (novoLOG) injection 5 Units  5 Units Subcutaneous Q4H Rust-Chester, Britton L, NP   5 Units at 02/06/21 1600   insulin glargine (LANTUS) injection 20 Units  20 Units Subcutaneous Daily Rust-Chester, Britton L, NP   20 Units at 02/06/21 0913   melatonin tablet 5 mg  5 mg Oral QHS Tyler Pita, MD   5 mg at 02/05/21 2159   methylPREDNISolone sodium succinate (SOLU-MEDROL) 40 mg/mL injection 40 mg  40 mg Intravenous Q8H Tyler Pita, MD   40 mg at 02/06/21 1453   metoprolol succinate (TOPROL-XL) 24 hr tablet 12.5 mg  12.5 mg Oral Daily Agbata, Tochukwu, MD   12.5 mg at 02/06/21 0903   morphine 2 MG/ML injection 2 mg  2 mg Intravenous Q2H PRN Flora Lipps, MD   2 mg at 02/06/21 0118   ondansetron (ZOFRAN) injection 4 mg  4 mg Intravenous Q6H PRN Agbata, Tochukwu, MD       revefenacin (YUPELRI) nebulizer solution 175 mcg  175 mcg Nebulization Daily Tyler Pita, MD   175 mcg at 02/06/21 0830   sodium chloride flush (NS) 0.9 % injection 3 mL  3 mL Intravenous Q12H Agbata, Tochukwu, MD   3 mL at 02/06/21 0904   sodium chloride flush (NS) 0.9 % injection 3 mL  3 mL Intravenous PRN Agbata, Tochukwu, MD   3 mL at 02/02/21 2103   traZODone (DESYREL) tablet 100 mg  100 mg Oral QHS Agbata, Tochukwu, MD   100 mg at 02/05/21 2159   [START ON 02/07/2021] vancomycin (VANCOREADY) IVPB  1250 mg/250 mL  1,250 mg Intravenous Q24H Tyler Pita, MD         Discharge Medications: Please see discharge summary for a list of discharge medications.  Relevant Imaging Results:  Relevant Lab Results:   Additional Information SS# [id number]  Adelene Amas, LCSWA

## 2021-02-06 NOTE — Progress Notes (Signed)
Pt placed on Bipap due to desats to mid 80's on HFNC. After approx 5 min on Bipap, O2 sat is 96.

## 2021-02-06 NOTE — TOC Progression Note (Signed)
Transition of Care [facility]) - Progression Note    Patient Details  Name: Julian Sparks MRN: [id number] Date of Birth: [birth year]  Transition of Care [facility]) CM/SW [place], [place] Phone Number: [phone] 02/06/2021, 5:23 PM  Clinical Narrative:     CSW spoke with patient's daughter Julian Sparks (Daughter)  [phone] [facility]), with update on PT recommendation.  CSW gave Ms. Domingo Cocking the Medicare.gov website information and explained the SNF placement process and possible timeline. Ms. Domingo Cocking stated she would like to speak with the patient about SNF placement before I do.  CSW stated , Ms Domingo Cocking contact me once she speaks with the patient about SNF.  FL2 sent for signature, Attending notified.   Expected Discharge Plan: [place] Barriers to Discharge: Continued Medical Work up, SNF Pending discharge summary, SNF Pending discharge orders, SNF Pending bed offer  Expected Discharge Plan and Services Expected Discharge Plan: [place] In-house Referral: Clinical Social Work   Post Acute Care Choice: [place] Living arrangements for the past 2 months: Single Family Home                                       Social Determinants of Health (SDOH) Interventions    Readmission Risk Interventions No flowsheet data found.

## 2021-02-06 NOTE — Progress Notes (Signed)
Progress Note  Patient Name: Julian Sparks Date of Encounter: 02/06/2021  [facility] Cardiologist: Dr. Rockey Situ  Subjective   He had hypoxia again requiring BiPAP.  Chest x-ray shows bilateral pneumonia and groundglass appearance especially on the right side.  He is currently not on any diuretic.  Inpatient Medications    Scheduled Meds:  arformoterol  15 mcg Nebulization BID   vitamin C  500 mg Oral Daily   atorvastatin  40 mg Oral Daily   budesonide (PULMICORT) nebulizer solution  0.5 mg Nebulization BID   Chlorhexidine Gluconate Cloth  6 each Topical Q0600   fluticasone  1 spray Each Nare BID   gabapentin  300 mg Oral TID   insulin aspart  0-20 Units Subcutaneous Q4H   insulin aspart  5 Units Subcutaneous Q4H   insulin glargine  20 Units Subcutaneous Daily   melatonin  5 mg Oral QHS   methylPREDNISolone (SOLU-MEDROL) injection  40 mg Intravenous Q8H   metoprolol succinate  12.5 mg Oral Daily   revefenacin  175 mcg Nebulization Daily   sodium chloride flush  3 mL Intravenous Q12H   traZODone  100 mg Oral QHS   Continuous Infusions:  sodium chloride     [START ON 02/07/2021] vancomycin     PRN Meds: sodium chloride, acetaminophen, albuterol, clonazepam, cyclobenzaprine, morphine injection, ondansetron (ZOFRAN) IV, sodium chloride flush   Vital Signs    Vitals:   02/06/21 0113 02/06/21 0200 02/06/21 0830 02/06/21 0903  BP:  115/67  137/66  Pulse:  (!) 56  76  Resp:  16    Temp:  98.1 F (36.7 C)    TempSrc:  Oral    SpO2: 96% 97% 96%   Weight:        Intake/Output Summary (Last 24 hours) at 02/06/2021 1319 Last data filed at 02/06/2021 1100 Gross per 24 hour  Intake 3638.48 ml  Output 2850 ml  Net 788.48 ml    Last 3 Weights 02/03/2021 02/02/2021 02/01/2021  Weight (lbs) 205 lb 4 oz 211 lb 10.3 oz 210 lb 8.6 oz  Weight (kg) 93.1 kg 96 kg 95.5 kg      Telemetry    Sinus rhythm- Personally Reviewed  ECG    No new tracing- Personally  Reviewed  Physical Exam   GEN:  Mild respiratory distress Neck: No JVD Cardiac: RRR Respiratory:  Rhonchorous breath sounds throughout GI: Soft, nontender, non-distended  MS: Trace edema; No deformity. Neuro:  Nonfocal  Psych: Normal affect   Labs    High Sensitivity Troponin:   Recent Labs  Lab 01/24/21 1132  TROPONINIHS 13       Chemistry Recent Labs  Lab 02/04/21 0452 02/05/21 0417 02/06/21 0614  NA 137 139 140  K 4.8 4.2 4.4  CL 96* 100 98  CO2 35* 34* 37*  GLUCOSE 258* 101* 123*  BUN 48* 51* 54*  CREATININE 1.09 0.94 0.89  CALCIUM 8.5* 8.9 8.6*  GFRNONAA >60 >60 >60  ANIONGAP 6 5 5       Hematology Recent Labs  Lab 02/04/21 0452 02/05/21 0417 02/06/21 0614  WBC 11.3* 14.7* 14.4*  RBC 3.22* 3.22* 3.13*  HGB 9.6* 9.6* 9.3*  HCT 31.4* 30.3* 30.1*  MCV 97.5 94.1 96.2  MCH 29.8 29.8 29.7  MCHC 30.6 31.7 30.9  RDW 14.4 14.7 15.0  PLT 214 247 261     BNP Recent Labs  Lab 02/03/21 0346  BNP 163.9*      DDimer No results for input(s):  DDIMER in the last 168 hours.   Radiology    DG Chest Port 1 View  Result Date: 02/06/2021 CLINICAL DATA:  Acute respiratory failure EXAM: PORTABLE CHEST 1 VIEW COMPARISON:  Radiograph 02/05/2021, CT 02/03/2021 FINDINGS: Diminished fissural thickening in the right lung albeit with persistent heterogeneous opacity throughout the right hemithorax and in the mid to lower lung on the left as well. Stable cardiomegaly with a calcified aorta. No visible pneumothorax or layering effusion. Degenerative changes are present in the imaged spine and shoulders. No acute osseous or chest wall abnormalities. Telemetry leads overlie the chest. IMPRESSION: Persistent asymmetric interstitial and airspace disease albeit with some diminished fissural thickening which could reflect decreasing edematous component. Stable cardiomegaly. Aortic Atherosclerosis (ICD10-I70.0). Electronically Signed   By: Lovena Le M.D.   On: 02/06/2021 04:16    DG Chest Port 1 View  Result Date: 02/05/2021 CLINICAL DATA:  Cough, congestion and shortness of breath. EXAM: PORTABLE CHEST 1 VIEW COMPARISON:  Chest x-ray 02/03/2021 FINDINGS: Stable borderline cardiac enlargement and tortuous calcified thoracic aorta. Stable prominent pulmonary hila. Persistent asymmetric interstitial and airspace process in the lungs. This could be due to underlying emphysema. There is a small amount of fluid noted in both major fissures. No definite pleural effusions otherwise. IMPRESSION: Persistent significant asymmetric interstitial and airspace process. Electronically Signed   By: Marijo Sanes M.D.   On: 02/05/2021 13:49     Cardiac Studies   Echo 12/2020 Reviewed, EF 55 to 60%  Patient Profile     75 y.o. male with history of HFpEF, COPD, hypoxic respiratory failure presenting with shortness of breath, diagnosed with hypoxia and hypercapnic respiratory failure.  Being seen for HFpEF.  Assessment & Plan    1.  HFpEF The patient's symptoms manifested mostly by right-sided heart failure with suspected pulmonary hypertension.  The patient was diuresed initially with IV furosemide and then subsequently was switched to Diamox and metolazone.  However, all diuretics are currently on hold as he appears to be euvolemic. Based on his clinical progress and chest x-ray findings, suspect that most of his respiratory failure at the present time is due to his lung disease.  I do not see much added benefit from a right heart catheterization at this point especially with this degree of oxygen requirement and abnormal chest x-ray. I am going to resume furosemide 40 mg once daily to try to keep him euvolemic.   2.  COPD, hypercapnic respiratory failure Repeat CT scan and chest x-ray are concerning for bilateral pneumonia with groundglass appearance. -BiPAP -Management as per pulmonary medicine, critical care team  3.  Abdominal adenopathy: History of lymphoma.  The patient was  seen by oncology with plans to start treatment in the near future but it was not felt that his respiratory failure is related to this.       Signed, Kathlyn Sacramento, MD  02/06/2021, 1:19 PM

## 2021-02-06 NOTE — Progress Notes (Signed)
NAME:  Julian Sparks, MRN:  [id number], DOB:  10/24/1945, LOS: 13 ADMISSION DATE:  01/24/2021, INITIAL CONSULTATION DATE: 01/27/2021 REFERRING MD: Max Sane MD, CHIEF COMPLAINT: Worsening SOB  Brief Patient Description  75 y.o. male with PMH as below presenting to the ED worsening shortness of breath from his baseline associated with bilateral lower extremity swelling as well as increased abdominal girth for about 2 days. Has generalized anasarca +hypoxia + chronic venous stasis with ulceration and was seen at the vascular clinic where he had on arrival placed bilaterally to both lower extremities. He was admitted to PCU with Severe ACUTE Hypoxic and Hypercapnic Respiratory Failure due to acute severe decompensated combined systloic and diastolic heart failure on BiPAP  Pertinent  Medical History    Chronic bronchitis (HCC)   COPD (chronic obstructive pulmonary disease) ([place])   Diabetes mellitus without complication ([place])   Diastolic dysfunction   Dyspnea   Chronic venous statis   Hypertension   Significant Hospital Events: Including procedures, antibiotic start and stop dates in addition to other pertinent events   5/31: Admitted for acute diastolic heart failure 6/1: Resp distress and mental status changes, placed on biPAP 6/2: Respiratory status improved, weaned off BiPAP to HFNC 50% FiO2 @ 60L 6/3: Overnight with agitation and unable to tolerate BiPAP with thick tan secretions, resulting in  hypoxia (O2 sats 60's) during coughing episode, transferred to [facility] ~ now weaned back to HFNC, add IV steroids for AECOPD 6/5: Tolerated BiPAP for 3 hours last night 6/6: Remains on HHFNC (currently at 87% FiO2),BMP concerning for Contraction Metabolic Alkalosis due to aggressive Diuresis with Lasix ~ will hold Lasix and give Diamox + Metolazone; Sputum culture with Streptococcus Agalactiae ~ will start Ceftriaxone  6/7: Placed on BiPAP this morning due to SOB, CXR with new RUL alveolar  infiltrate (placed on Rocephin yesterday and sputum cx with Streptococcus Agalactiae. Will decrease Diamox to 500 mg daily, continue Metolazone 2.5 mg daily 6/8: On BiPAP overnight (50% FiO2) and tolerating, plan for HHFNC during day, diuresing well and contraction alkalosis continues to improve with Diamox + metolazone 6/9: Ceftriaxone switched to Cefazolin, remains on HHFNC (currently @ 65% FiO2), diuresing well, Creatinine remains stable, foley catheter removed 6/10: CT chest concerning for R>L multifocal opacity (consistent with pnuemonia vs. PAH), along with progression of previous abdominal adenopathy (consistent with Lymphoma); Oncology consulted, Holding diuresis due to soft BP and slight increase in serum Creatinine, decrease steroids 02/06/21-patient remains on high flow nasal cannula with fragmented speech, I met with family at bedside including wife and daughter.  Patient still unable to lean back in supine position and we reviewed possibly needing cardiac cath in the future however he is very nervous about this and we explained that this will be done only when he is in chronic stable state.  He still has bilateral wheezing in all lung zones on exam today.  Has not had hemoptysis during my shift today.  Cultures:  01/24/2021: SARS-CoV-2 PCR>> negative 01/24/2021: Influenza PCR>> negative 01/27/2021: MRSA PCR>> negative 01/27/2021: Sputum>> STREPTOCOCCUS AGALACTIAE  Antimicrobials:  Ceftriaxone 6/6>>6/9 Cefazolin 6/9>>     FiO2 (%):  [50 %-65 %] 63 %  OBJECTIVE   Blood pressure 115/67, pulse (!) 56, temperature 98.1 F (36.7 C), temperature source Oral, resp. rate 16, weight 93.1 kg, SpO2 96 %.    FiO2 (%):  [50 %-65 %] 63 %   Intake/Output Summary (Last 24 hours) at 02/06/2021 0836 Last data filed at 02/06/2021 0500 Gross per 24  hour  Intake 3398.48 ml  Output 2200 ml  Net 1198.48 ml    Filed Weights   02/01/21 0500 02/02/21 0442 02/03/21 0500  Weight: 95.5 kg 96 kg 93.1 kg     Physical Examination: GENERAL: Acute on chronically ill-appearing male, sitting up in chair , on HHFNC, no acute distress HEAD: Normocephalic, atraumatic.  EYES: Pupils equal, round, reactive to light.  No scleral icterus.  MOUTH: Moist mucosal membrane. NECK: Supple, positive JVD PULMONARY: Coarse breath sounds bilaterally, wheezing today,no rales, even, no accessory muscle use CARDIOVASCULAR: S1 and S2. Regular rate and rhythm. No murmurs, rubs, or gallops.  GASTROINTESTINAL: Soft, nontender, -distended. Positive bowel sounds.  MUSCULOSKELETAL: Trace edema bilateral lower extremity, no deformities, normal bulk and tone  NEUROLOGIC: Awake, alert and oriented x3, moves all extremities to command, no focal deficits, speech clear, pupils PERRLA SKIN: Limited exam- intact,warm,dry   Review of Systems:  A 10 point review of systems was performed and it is as noted above otherwise negative.   CXR 6/10>>There is extensive airspace opacity in the right upper and mid lung regions, slightly increased. There is patchy airspace opacity in the left mid lung, stable, and in the left base, marginally increased. There is stable cardiomegaly with a degree of pulmonary venous hypertension. There is aortic atherosclerosis. No adenopathy no bone lesions. CT Chest w/o contrast 6/10>>1. Right greater than left multifocal airspace and ground-glass opacity, most consistent with pneumonia. 2. Tiny bilateral pleural effusions. 3. Progression of massive upper abdominal adenopathy,consistent with lymphoma. 4. Development of mild thoracic adenopathy which could be reactive or related to thoracic extension of lymphoma. 5. Aortic atherosclerosis (ICD10-I70.0), coronary artery atherosclerosis and emphysema (ICD10-J43.9). 6. Pulmonary artery enlargement suggests pulmonary arterial hypertension. 7. Interval T7 compression deformity.   Labs   CBC: Recent Labs  Lab 02/02/21 0501 02/03/21 0346  02/03/21 1004 02/04/21 0452 02/05/21 0417 02/06/21 0614  WBC 9.6 10.1  --  11.3* 14.7* 14.4*  HGB 10.2* 9.7* 10.4* 9.6* 9.6* 9.3*  HCT 32.7* 32.0* 34.5* 31.4* 30.3* 30.1*  MCV 95.9 98.2  --  97.5 94.1 96.2  PLT 190 202  --  214 247 261     Basic Metabolic Panel: Recent Labs  Lab 02/01/21 0439 02/02/21 0501 02/03/21 0346 02/04/21 0452 02/05/21 0417 02/06/21 0614  NA 138 136 138 137 139 140  K 4.1 4.0 3.8 4.8 4.2 4.4  CL 94* 95* 98 96* 100 98  CO2 34* 33* 33* 35* 34* 37*  GLUCOSE 126* 124* 195* 258* 101* 123*  BUN 39* 39* 44* 48* 51* 54*  CREATININE 0.86 0.88 1.12 1.09 0.94 0.89  CALCIUM 9.0 8.7* 8.6* 8.5* 8.9 8.6*  MG 2.0 2.4 2.1  --  2.2 2.3  PHOS 3.8 4.2 4.3  --  3.4 3.7    GFR: Estimated Creatinine Clearance: 75.4 mL/min (by C-G formula based on SCr of 0.89 mg/dL). Recent Labs  Lab 02/03/21 0346 02/04/21 0452 02/05/21 0417 02/06/21 0614  WBC 10.1 11.3* 14.7* 14.4*     Liver Function Tests: No results for input(s): AST, ALT, ALKPHOS, BILITOT, PROT, ALBUMIN in the last 168 hours. No results for input(s): LIPASE, AMYLASE in the last 168 hours. No results for input(s): AMMONIA in the last 168 hours.  ABG    Component Value Date/Time   PHART 7.34 (L) 01/27/2021 0115   PCO2ART 77 (HH) 01/27/2021 0115   PO2ART 72 (L) 01/27/2021 0115   HCO3 41.5 (H) 01/27/2021 0115   O2SAT 93.3 01/27/2021 0115  Coagulation Profile: Recent Labs  Lab 02/03/21 0527  INR 1.1     Cardiac Enzymes: No results for input(s): CKTOTAL, CKMB, CKMBINDEX, TROPONINI in the last 168 hours.  HbA1C: Hgb A1c MFr Bld  Date/Time Value Ref Range Status  01/24/2021 11:32 AM 6.8 (H) 4.8 - 5.6 % Final    Comment:    (NOTE)         Prediabetes: 5.7 - 6.4         Diabetes: >6.4         Glycemic control for adults with diabetes: <7.0   02/05/2019 02:07 AM 7.3 (H) 4.8 - 5.6 % Final    Comment:    (NOTE) Pre diabetes:          5.7%-6.4% Diabetes:              >6.4% Glycemic  control for   <7.0% adults with diabetes     CBG: Recent Labs  Lab 02/05/21 1542 02/05/21 2004 02/06/21 0000 02/06/21 0405 02/06/21 0744  GLUCAP 336* 369* 163* 138* 123*     Allergies Allergies  Allergen Reactions   Aspirin Nausea Only and Other (See Comments)    325 mg aspirin cause stomach upset   Penicillins Rash    Scheduled Meds:  arformoterol  15 mcg Nebulization BID   vitamin C  500 mg Oral Daily   atorvastatin  40 mg Oral Daily   budesonide (PULMICORT) nebulizer solution  0.5 mg Nebulization BID   Chlorhexidine Gluconate Cloth  6 each Topical Q0600   fluticasone  1 spray Each Nare BID   gabapentin  300 mg Oral TID   insulin aspart  0-20 Units Subcutaneous Q4H   insulin aspart  5 Units Subcutaneous Q4H   insulin glargine  20 Units Subcutaneous Daily   melatonin  5 mg Oral QHS   methylPREDNISolone (SOLU-MEDROL) injection  40 mg Intravenous Q8H   metoprolol succinate  12.5 mg Oral Daily   revefenacin  175 mcg Nebulization Daily   sodium chloride flush  3 mL Intravenous Q12H   traZODone  100 mg Oral QHS   Continuous Infusions:  sodium chloride     [START ON 02/07/2021] vancomycin     PRN Meds:.sodium chloride, acetaminophen, albuterol, clonazepam, cyclobenzaprine, morphine injection, ondansetron (ZOFRAN) IV, sodium chloride flush   Resolved Hospital Problem list     ASSESSMENT & PLAN   Acute Hypoxic Hypercapnic Respiratory Failure secondary to Acute Decompensated HFpEF, AECOPD, STREPTOCOCCUS AGALACTIAE Pneumonia, &  post COVID Pneumonitis/fibrosis Hemoptysis ? Diffuse Alveolar Hemorrhage -Supplemental O2 as needed to maintain O2 saturations 88 to 92% -BiPAP as needed -Follow intermittent ABG and chest x-ray as needed -Diuresis as BP and renal function permits ~Lasix today -IV Steroids, decreased SoluMedrol to 19m q 12h  -ESR, CRP, RF,nonrevealing thus far, ANCA pendidng -Brovana BID, Yupelri q d, PRN albuterol, Budesonide BID -Sputum culture from  01/27/21 with STREPTOCOCCUS AGALACTIAE ~ -Continue Cefazolin, add Vanco ? Resistance   Acute on chronic Diastolic HFpEF  Hypertension Hx: HLD  -Continuous cardiac monitoring -Maintain MAP greater than 65 -Diuresis as BP and renal function permits ~ hold diuresis today -Hold HCTZ, metoprolol. -Continue low dose Losartan -Atorvastatin 40 mg -Repeat 2D Echocardiogram shows Left ventricular ejection fraction, by estimation, is 50 to 55%.  Hypokinesis of septal wall. Left ventricular diastolic parameters are consistent with Grade II diastolic dysfunction (pseudonormalization).The right ventricular size is severely enlarged. -Cardiology following, appreciate input  STREPTOCOCCUS AGALACTIAE Pneumonia -Monitor fever curve -Trend WBC's -Sputum culture on 6/3 with Strep  Agalactiae -Continue Cefazolin add Vanco -Recollect sputum, CXR in AM  Contraction Metabolic Alkalosis, suspect due to IV Lasix~ improving -Monitor I&O's / urinary output -Follow BMP -Ensure adequate renal perfusion -Avoid nephrotoxic agents as able -Replace electrolytes as indicated   Abdominal Adenopathy: Known lymphoma -Oncology consulted, appreciate input -CT Abdomen/Pelvis with contrast shows increased lymphadenopathy compared to 2020  Anemia Chronic disease+acute blood loss  Monitor No anticoagulants SCDs    Acute Metabolic Encephalopathy due to Hypercapnia now improved -off Preceddex -Provide supportive care -BiPAP @ hs   Diabetes mellitus -CBGs -Sliding scale insulin & 10 units Lantus daily -Follow ICU hyper/hypoglycemia protocol -Hold home Glipizide     Best practice (right click and "Reselect all SmartList Selections" daily)  Diet:  Oral Pain/Anxiety/Delirium protocol (if indicated): No VAP protocol (if indicated): Not indicated DVT prophylaxis: Contraindicated due to hemoptysis  GI prophylaxis: PPI Glucose control:  SSI Yes Central venous access:  N/A Arterial line:  N/A Foley:   N/A Mobility:  OOB as tolerated  PT consulted: Yes Last date of multidisciplinary goals of care discussion [02/03/21] Code Status:  Limited Code (NO CPR) Disposition: Stepdown   Level 3 follow-up    Critical care provider statement:    Critical care time (minutes):  33   Critical care time was exclusive of:  Separately billable procedures and  treating other patients   Critical care was necessary to treat or prevent imminent or  life-threatening deterioration of the following conditions: Acute hypoxemic respiratory failure, sepsis, pneumonia, multiple comorbid conditions   Critical care was time spent personally by me on the following  activities:  Development of treatment plan with patient or surrogate,  discussions with consultants, evaluation of patient's response to  treatment, examination of patient, obtaining history from patient or  surrogate, ordering and performing treatments and interventions, ordering  and review of laboratory studies and re-evaluation of patient's condition   I assumed direction of critical care for this patient from another  provider in my specialty: no      *This note was dictated using voice recognition software/Dragon.  Despite best efforts to proofread, errors can occur which can change the meaning.  Any change was purely unintentional.      Ottie Glazier, M.D.  Pulmonary [facility] [place]

## 2021-02-07 ENCOUNTER — Encounter (INDEPENDENT_AMBULATORY_CARE_PROVIDER_SITE_OTHER): Payer: Medicare HMO

## 2021-02-07 DIAGNOSIS — E118 Type 2 diabetes mellitus with unspecified complications: Secondary | ICD-10-CM

## 2021-02-07 DIAGNOSIS — J432 Centrilobular emphysema: Secondary | ICD-10-CM

## 2021-02-07 LAB — CBC
HCT: 29.5 % — ABNORMAL LOW (ref 39.0–52.0)
Hemoglobin: 9.5 g/dL — ABNORMAL LOW (ref 13.0–17.0)
MCH: 30.1 pg (ref 26.0–34.0)
MCHC: 32.2 g/dL (ref 30.0–36.0)
MCV: 93.4 fL (ref 80.0–100.0)
Platelets: 276 10*3/uL (ref 150–400)
RBC: 3.16 MIL/uL — ABNORMAL LOW (ref 4.22–5.81)
RDW: 14.8 % (ref 11.5–15.5)
WBC: 15.3 10*3/uL — ABNORMAL HIGH (ref 4.0–10.5)
nRBC: 0 % (ref 0.0–0.2)

## 2021-02-07 LAB — PHOSPHORUS: Phosphorus: 3.8 mg/dL (ref 2.5–4.6)

## 2021-02-07 LAB — BASIC METABOLIC PANEL
Anion gap: 5 (ref 5–15)
BUN: 52 mg/dL — ABNORMAL HIGH (ref 8–23)
CO2: 37 mmol/L — ABNORMAL HIGH (ref 22–32)
Calcium: 8.5 mg/dL — ABNORMAL LOW (ref 8.9–10.3)
Chloride: 97 mmol/L — ABNORMAL LOW (ref 98–111)
Creatinine, Ser: 0.9 mg/dL (ref 0.61–1.24)
GFR, Estimated: >60 mL/min (ref >60)
Glucose, Bld: 138 mg/dL — ABNORMAL HIGH (ref 70–99)
Potassium: 4.8 mmol/L (ref 3.5–5.1)
Sodium: 139 mmol/L (ref 135–145)

## 2021-02-07 LAB — GLUCOSE, CAPILLARY
Glucose-Capillary: 143 mg/dL — ABNORMAL HIGH (ref 70–99)
Glucose-Capillary: 164 mg/dL — ABNORMAL HIGH (ref 70–99)
Glucose-Capillary: 280 mg/dL — ABNORMAL HIGH (ref 70–99)
Glucose-Capillary: 293 mg/dL — ABNORMAL HIGH (ref 70–99)
Glucose-Capillary: 329 mg/dL — ABNORMAL HIGH (ref 70–99)
Glucose-Capillary: 192 mg/dL — ABNORMAL HIGH (ref 70–99)

## 2021-02-07 LAB — MAGNESIUM: Magnesium: 2.3 mg/dL (ref 1.7–2.4)

## 2021-02-07 MED ORDER — LEVOFLOXACIN IN D5W 750 MG/150ML IV SOLN
750.0000 mg | INTRAVENOUS | Status: DC
  Administered 2021-02-07 – 2021-02-09 (×3): 750 mg via INTRAVENOUS
  Filled 2021-02-07 (×5): qty 150

## 2021-02-07 MED ORDER — INSULIN ASPART 100 UNIT/ML IJ SOLN
7.0000 [IU] | Freq: Three times a day (TID) | INTRAMUSCULAR | Status: DC
  Administered 2021-02-07 – 2021-02-10 (×12): 7 [IU] via SUBCUTANEOUS
  Filled 2021-02-07 (×12): qty 1

## 2021-02-07 MED ORDER — SODIUM CHLORIDE 0.9 % IV SOLN
100.0000 mg | Freq: Two times a day (BID) | INTRAVENOUS | Status: DC
  Administered 2021-02-07: 100 mg via INTRAVENOUS
  Filled 2021-02-07 (×2): qty 100

## 2021-02-07 NOTE — Progress Notes (Signed)
Progress Note  Patient Name: Julian Sparks Date of Encounter: 02/07/2021  [facility] Cardiologist: Dr. Rockey Situ  Subjective   Has not required BiPAP over the past 24 hours. Remains on HFNC.   Inpatient Medications    Scheduled Meds:  arformoterol  15 mcg Nebulization BID   vitamin C  500 mg Oral Daily   atorvastatin  40 mg Oral Daily   budesonide (PULMICORT) nebulizer solution  0.5 mg Nebulization BID   Chlorhexidine Gluconate Cloth  6 each Topical Q0600   fluticasone  1 spray Each Nare BID   furosemide  40 mg Oral Daily   gabapentin  300 mg Oral TID   insulin aspart  0-20 Units Subcutaneous Q4H   insulin aspart  7 Units Subcutaneous TID AC   insulin glargine  20 Units Subcutaneous Daily   melatonin  5 mg Oral QHS   methylPREDNISolone (SOLU-MEDROL) injection  40 mg Intravenous Q8H   metoprolol succinate  12.5 mg Oral Daily   revefenacin  175 mcg Nebulization Daily   sodium chloride flush  3 mL Intravenous Q12H   traZODone  100 mg Oral QHS   Continuous Infusions:  sodium chloride     doxycycline (VIBRAMYCIN) IV 125 mL/hr at 02/07/21 1237   PRN Meds: sodium chloride, acetaminophen, albuterol, clonazepam, cyclobenzaprine, morphine injection, ondansetron (ZOFRAN) IV, sodium chloride flush   Vital Signs    Vitals:   02/07/21 0800 02/07/21 0832 02/07/21 0915 02/07/21 1200  BP: 111/60  (!) 120/98 103/81  Pulse: (!) 59  83 72  Resp: 16  20 16   Temp:    98.8 F (37.1 C)  TempSrc:    Oral  SpO2: 92% 94%  95%  Weight:        Intake/Output Summary (Last 24 hours) at 02/07/2021 1320 Last data filed at 02/07/2021 1237 Gross per 24 hour  Intake 1237.31 ml  Output 4250 ml  Net -3012.69 ml    Last 3 Weights 02/03/2021 02/02/2021 02/01/2021  Weight (lbs) 205 lb 4 oz 211 lb 10.3 oz 210 lb 8.6 oz  Weight (kg) 93.1 kg 96 kg 95.5 kg      Telemetry    Sinus rhythm- Personally Reviewed  ECG    No new tracing- Personally Reviewed  Physical Exam   GEN:  No acute  distress Neck: No JVD Cardiac: RRR Respiratory:  Rhonchorous breath sounds throughout GI: Soft, nontender, non-distended  MS: Trace edema R>L; No deformity Neuro:  Nonfocal  Psych: Normal affect   Labs    High Sensitivity Troponin:   Recent Labs  Lab 01/24/21 1132  TROPONINIHS 13       Chemistry Recent Labs  Lab 02/05/21 0417 02/06/21 0614 02/07/21 0353  NA 139 140 139  K 4.2 4.4 4.8  CL 100 98 97*  CO2 34* 37* 37*  GLUCOSE 101* 123* 138*  BUN 51* 54* 52*  CREATININE 0.94 0.89 0.90  CALCIUM 8.9 8.6* 8.5*  GFRNONAA >60 >60 >60  ANIONGAP 5 5 5       Hematology Recent Labs  Lab 02/05/21 0417 02/06/21 0614 02/07/21 0353  WBC 14.7* 14.4* 15.3*  RBC 3.22* 3.13* 3.16*  HGB 9.6* 9.3* 9.5*  HCT 30.3* 30.1* 29.5*  MCV 94.1 96.2 93.4  MCH 29.8 29.7 30.1  MCHC 31.7 30.9 32.2  RDW 14.7 15.0 14.8  PLT 247 261 276     BNP Recent Labs  Lab 02/03/21 0346  BNP 163.9*      DDimer No results for input(s): DDIMER in  the last 168 hours.   Radiology    DG Chest Port 1 View  Result Date: 02/06/2021 CLINICAL DATA:  Acute respiratory failure EXAM: PORTABLE CHEST 1 VIEW COMPARISON:  Radiograph 02/05/2021, CT 02/03/2021 FINDINGS: Diminished fissural thickening in the right lung albeit with persistent heterogeneous opacity throughout the right hemithorax and in the mid to lower lung on the left as well. Stable cardiomegaly with a calcified aorta. No visible pneumothorax or layering effusion. Degenerative changes are present in the imaged spine and shoulders. No acute osseous or chest wall abnormalities. Telemetry leads overlie the chest. IMPRESSION: Persistent asymmetric interstitial and airspace disease albeit with some diminished fissural thickening which could reflect decreasing edematous component. Stable cardiomegaly. Aortic Atherosclerosis (ICD10-I70.0). Electronically Signed   By: Lovena Le M.D.   On: 02/06/2021 04:16     Cardiac Studies   Echo 12/2020 Reviewed,  EF 55 to 60%  Patient Profile     75 y.o. male with history of HFpEF, COPD, hypoxic respiratory failure presenting with shortness of breath, diagnosed with hypoxia and hypercapnic respiratory failure.  Being seen for HFpEF.  Assessment & Plan    1.  HFpEF: -The patient's symptoms manifested mostly by right-sided heart failure with suspected pulmonary hypertension.  He was diuresed initially with IV furosemide and then subsequently was switched to Diamox and metolazone  -Continue Lasix 40 mg daily to maintain euvolemia -Based on his clinical progress and chest x-ray findings, suspect that most of his respiratory failure at the present time is due to his lung disease -It has been felt there is not much added benefit from a right heart catheterization at this point especially with this degree of oxygen requirement and abnormal chest x-ray  2.  Acute hypoxic and hypercapnic respiratory failure: -Multifactorial including COPD exacerbation, Streptococcus agalactiae pneumonia, post-COVID pneumonitis/fibrosis, and HFpEF  -Suspect a significant component of his continued respiratory distress is pulmonary in etiology with a degree of volume overload -He remains on high flow nasal cannula -Management as per pulmonary medicine, critical care team  3.  Abdominal adenopathy:  -History of lymphoma -The patient was seen by oncology with plans to start treatment in the near future but it was not felt that his respiratory failure is related to this       Signed, Christell Faith, PA-C  02/07/2021, 1:20 PM

## 2021-02-07 NOTE — Progress Notes (Signed)
Physical Therapy Treatment Patient Details Name: Julian Sparks MRN: [id number] DOB: 17-May-1946 Today's Date: 02/07/2021    History of Present Illness presented to ER secondary to progressive SOB, LE and abdominal edema; admitted for management of acute/chronic hypoxic, hypercapnic respiratory failure due to CHF/COPD.    PT Comments    Pt in chair.  OK from RN to treat.  Pt is coughing up bright bloody sputum using hand held suction to manage.  Pt reports feeling well today. Participated in exercises as described below.  Stood x 2 and is able to walk forward and back x 3 (30') on 2 attempts.  Overall does quite well with gait today with min guard for balance but +2 assist for equipment.  Sats decrease to 86/87 % but with good recovery on HFNC.   Follow Up Recommendations  SNF     Equipment Recommendations  None recommended by PT    Recommendations for Other Services       Precautions / Restrictions Precautions Precautions: Fall Restrictions Weight Bearing Restrictions: No    Mobility  Bed Mobility               General bed mobility comments: seated in recliner beginning/end of treatment session    Transfers Overall transfer level: Needs assistance Equipment used: Rolling walker (2 wheeled) Transfers: Sit to/from Stand Sit to Stand: Min guard            Ambulation/Gait Ambulation/Gait assistance: Min guard;+2 safety/equipment Gait Distance (Feet): 30 Feet Assistive device: Rolling walker (2 wheeled) Gait Pattern/deviations: Decreased step length - left;Decreased step length - right;Decreased stride length Gait velocity: decreased       Stairs             Wheelchair Mobility    Modified Rankin (Stroke Patients Only)       Balance Overall balance assessment: Needs assistance Sitting-balance support: No upper extremity supported;Feet supported Sitting balance-Leahy Scale: Good     Standing balance support: Bilateral upper extremity  supported;During functional activity Standing balance-Leahy Scale: Fair                              Cognition Arousal/Alertness: Awake/alert Behavior During Therapy: WFL for tasks assessed/performed Overall Cognitive Status: Within Functional Limits for tasks assessed                                        Exercises Other Exercises Other Exercises: seated AROM x 10    General Comments        Pertinent Vitals/Pain Pain Assessment: No/denies pain    Home Living                      Prior Function            PT Goals (current goals can now be found in the care plan section) Progress towards PT goals: Progressing toward goals    Frequency    Min 2X/week      PT Plan Current plan remains appropriate    Co-evaluation              AM-PAC PT "6 Clicks" Mobility   Outcome Measure  Help needed turning from your back to your side while in a flat bed without using bedrails?: None Help needed moving from lying on your back to sitting on the side  of a flat bed without using bedrails?: A Little Help needed moving to and from a bed to a chair (including a wheelchair)?: A Little Help needed standing up from a chair using your arms (e.g., wheelchair or bedside chair)?: A Little Help needed to walk in hospital room?: A Little Help needed climbing 3-5 steps with a railing? : A Lot 6 Click Score: 18    End of Session Equipment Utilized During Treatment: Oxygen;Gait belt Activity Tolerance: Patient tolerated treatment well Patient left: in chair;with call bell/phone within reach;with chair alarm set Nurse Communication: Mobility status PT Visit Diagnosis: Difficulty in walking, not elsewhere classified (R26.2);Muscle weakness (generalized) (M62.81)     Time: 1002-1020 PT Time Calculation (min) (ACUTE ONLY): 18 min  Charges:  $Gait Training: 8-22 mins                    Chesley Noon, PTA 02/07/21, 1:05 PM , 1:01 PM

## 2021-02-07 NOTE — Progress Notes (Signed)
NAME:  Julian Sparks, MRN:  [id number], DOB:  1945-12-05, LOS: 43 ADMISSION DATE:  01/24/2021, INITIAL CONSULTATION DATE: 01/27/2021 REFERRING MD: Max Sane MD, CHIEF COMPLAINT: Worsening SOB  Brief Patient Description  75 y.o. male with PMH as below presenting to the ED worsening shortness of breath from his baseline associated with bilateral lower extremity swelling as well as increased abdominal girth for about 2 days. Has generalized anasarca +hypoxia + chronic venous stasis with ulceration and was seen at the vascular clinic where he had on arrival placed bilaterally to both lower extremities. He was admitted to PCU with Severe ACUTE Hypoxic and Hypercapnic Respiratory Failure due to acute severe decompensated combined systloic and diastolic heart failure on BiPAP  Pertinent  Medical History    Chronic bronchitis (HCC)   COPD (chronic obstructive pulmonary disease) ([place])   Diabetes mellitus without complication ([place])   Diastolic dysfunction   Dyspnea   Chronic venous statis   Hypertension   Significant Hospital Events: Including procedures, antibiotic start and stop dates in addition to other pertinent events   5/31: Admitted for acute diastolic heart failure 6/1: Resp distress and mental status changes, placed on biPAP 6/2: Respiratory status improved, weaned off BiPAP to HFNC 50% FiO2 @ 60L 6/3: Overnight with agitation and unable to tolerate BiPAP with thick tan secretions, resulting in  hypoxia (O2 sats 60's) during coughing episode, transferred to [facility] ~ now weaned back to HFNC, add IV steroids for AECOPD 6/5: Tolerated BiPAP for 3 hours last night 6/6: Remains on HHFNC (currently at 87% FiO2),BMP concerning for Contraction Metabolic Alkalosis due to aggressive Diuresis with Lasix ~ will hold Lasix and give Diamox + Metolazone; Sputum culture with Streptococcus Agalactiae ~ will start Ceftriaxone  6/7: Placed on BiPAP this morning due to SOB, CXR with new RUL alveolar  infiltrate (placed on Rocephin yesterday and sputum cx with Streptococcus Agalactiae. Will decrease Diamox to 500 mg daily, continue Metolazone 2.5 mg daily 6/8: On BiPAP overnight (50% FiO2) and tolerating, plan for HHFNC during day, diuresing well and contraction alkalosis continues to improve with Diamox + metolazone 6/9: Ceftriaxone switched to Cefazolin, remains on HHFNC (currently @ 65% FiO2), diuresing well, Creatinine remains stable, foley catheter removed 6/10: CT chest concerning for R>L multifocal opacity (consistent with pnuemonia vs. PAH), along with progression of previous abdominal adenopathy (consistent with Lymphoma); Oncology consulted, Holding diuresis due to soft BP and slight increase in serum Creatinine, decrease steroids 02/06/21-patient remains on high flow nasal cannula with fragmented speech, I met with family at bedside including wife and daughter.  Patient still unable to lean back in supine position and we reviewed possibly needing cardiac cath in the future however he is very nervous about this and we explained that this will be done only when he is in chronic stable state.  He still has bilateral wheezing in all lung zones on exam today.  Has not had hemoptysis during my shift today. 02/07/21- patient still on HFNC, family at bedside. Reviewed CT with them and atelcetasis on rll will start Metaneb  Cultures:  01/24/2021: SARS-CoV-2 PCR>> negative 01/24/2021: Influenza PCR>> negative 01/27/2021: MRSA PCR>> negative 01/27/2021: Sputum>> STREPTOCOCCUS AGALACTIAE  Antimicrobials:  Ceftriaxone 6/6>>6/9 Cefazolin 6/9>>     FiO2 (%):  [50 %-65 %] 65 %  OBJECTIVE   Blood pressure (!) 120/98, pulse 83, temperature 97.8 F (36.6 C), temperature source Oral, resp. rate 20, weight 93.1 kg, SpO2 94 %.    FiO2 (%):  [50 %-65 %] 65 %  Intake/Output Summary (Last 24 hours) at 02/07/2021 0951 Last data filed at 02/07/2021 0900 Gross per 24 hour  Intake 1245.83 ml  Output 3900 ml   Net -2654.17 ml    Filed Weights   02/01/21 0500 02/02/21 0442 02/03/21 0500  Weight: 95.5 kg 96 kg 93.1 kg    Physical Examination: GENERAL: Acute on chronically ill-appearing male, sitting up in chair , on HHFNC, no acute distress HEAD: Normocephalic, atraumatic.  EYES: Pupils equal, round, reactive to light.  No scleral icterus.  MOUTH: Moist mucosal membrane. NECK: Supple, positive JVD PULMONARY: Coarse breath sounds bilaterally, wheezing today,no rales, even, no accessory muscle use CARDIOVASCULAR: S1 and S2. Regular rate and rhythm. No murmurs, rubs, or gallops.  GASTROINTESTINAL: Soft, nontender, -distended. Positive bowel sounds.  MUSCULOSKELETAL: Trace edema bilateral lower extremity, no deformities, normal bulk and tone  NEUROLOGIC: Awake, alert and oriented x3, moves all extremities to command, no focal deficits, speech clear, pupils PERRLA SKIN: Limited exam- intact,warm,dry   Review of Systems:  A 10 point review of systems was performed and it is as noted above otherwise negative.   CXR 6/10>>There is extensive airspace opacity in the right upper and mid lung regions, slightly increased. There is patchy airspace opacity in the left mid lung, stable, and in the left base, marginally increased. There is stable cardiomegaly with a degree of pulmonary venous hypertension. There is aortic atherosclerosis. No adenopathy no bone lesions. CT Chest w/o contrast 6/10>>1. Right greater than left multifocal airspace and ground-glass opacity, most consistent with pneumonia. 2. Tiny bilateral pleural effusions. 3. Progression of massive upper abdominal adenopathy,consistent with lymphoma. 4. Development of mild thoracic adenopathy which could be reactive or related to thoracic extension of lymphoma. 5. Aortic atherosclerosis (ICD10-I70.0), coronary artery atherosclerosis and emphysema (ICD10-J43.9). 6. Pulmonary artery enlargement suggests pulmonary  arterial hypertension. 7. Interval T7 compression deformity.   Labs   CBC: Recent Labs  Lab 02/03/21 0346 02/03/21 1004 02/04/21 0452 02/05/21 0417 02/06/21 0614 02/07/21 0353  WBC 10.1  --  11.3* 14.7* 14.4* 15.3*  HGB 9.7* 10.4* 9.6* 9.6* 9.3* 9.5*  HCT 32.0* 34.5* 31.4* 30.3* 30.1* 29.5*  MCV 98.2  --  97.5 94.1 96.2 93.4  PLT 202  --  214 247 261 276     Basic Metabolic Panel: Recent Labs  Lab 02/02/21 0501 02/03/21 0346 02/04/21 0452 02/05/21 0417 02/06/21 0614 02/07/21 0353  NA 136 138 137 139 140 139  K 4.0 3.8 4.8 4.2 4.4 4.8  CL 95* 98 96* 100 98 97*  CO2 33* 33* 35* 34* 37* 37*  GLUCOSE 124* 195* 258* 101* 123* 138*  BUN 39* 44* 48* 51* 54* 52*  CREATININE 0.88 1.12 1.09 0.94 0.89 0.90  CALCIUM 8.7* 8.6* 8.5* 8.9 8.6* 8.5*  MG 2.4 2.1  --  2.2 2.3 2.3  PHOS 4.2 4.3  --  3.4 3.7 3.8    GFR: Estimated Creatinine Clearance: 74.6 mL/min (by C-G formula based on SCr of 0.9 mg/dL). Recent Labs  Lab 02/04/21 0452 02/05/21 0417 02/06/21 0614 02/07/21 0353  WBC 11.3* 14.7* 14.4* 15.3*     Liver Function Tests: No results for input(s): AST, ALT, ALKPHOS, BILITOT, PROT, ALBUMIN in the last 168 hours. No results for input(s): LIPASE, AMYLASE in the last 168 hours. No results for input(s): AMMONIA in the last 168 hours.  ABG    Component Value Date/Time   PHART 7.34 (L) 01/27/2021 0115   PCO2ART 77 (HH) 01/27/2021 0115   PO2ART 72 (L) 01/27/2021  0115   HCO3 41.5 (H) 01/27/2021 0115   O2SAT 93.3 01/27/2021 0115     Coagulation Profile: Recent Labs  Lab 02/03/21 0527  INR 1.1     Cardiac Enzymes: No results for input(s): CKTOTAL, CKMB, CKMBINDEX, TROPONINI in the last 168 hours.  HbA1C: Hgb A1c MFr Bld  Date/Time Value Ref Range Status  01/24/2021 11:32 AM 6.8 (H) 4.8 - 5.6 % Final    Comment:    (NOTE)         Prediabetes: 5.7 - 6.4         Diabetes: >6.4         Glycemic control for adults with diabetes: <7.0   02/05/2019 02:07  AM 7.3 (H) 4.8 - 5.6 % Final    Comment:    (NOTE) Pre diabetes:          5.7%-6.4% Diabetes:              >6.4% Glycemic control for   <7.0% adults with diabetes     CBG: Recent Labs  Lab 02/06/21 1611 02/06/21 2000 02/06/21 2347 02/07/21 0426 02/07/21 0725  GLUCAP 293* 245* 105* 143* 164*     Allergies Allergies  Allergen Reactions   Aspirin Nausea Only and Other (See Comments)    325 mg aspirin cause stomach upset   Penicillins Rash    Scheduled Meds:  arformoterol  15 mcg Nebulization BID   vitamin C  500 mg Oral Daily   atorvastatin  40 mg Oral Daily   budesonide (PULMICORT) nebulizer solution  0.5 mg Nebulization BID   Chlorhexidine Gluconate Cloth  6 each Topical Q0600   fluticasone  1 spray Each Nare BID   furosemide  40 mg Oral Daily   gabapentin  300 mg Oral TID   insulin aspart  0-20 Units Subcutaneous Q4H   insulin aspart  7 Units Subcutaneous TID AC   insulin glargine  20 Units Subcutaneous Daily   melatonin  5 mg Oral QHS   methylPREDNISolone (SOLU-MEDROL) injection  40 mg Intravenous Q8H   metoprolol succinate  12.5 mg Oral Daily   revefenacin  175 mcg Nebulization Daily   sodium chloride flush  3 mL Intravenous Q12H   traZODone  100 mg Oral QHS   Continuous Infusions:  sodium chloride     vancomycin 166.7 mL/hr at 02/07/21 0300   PRN Meds:.sodium chloride, acetaminophen, albuterol, clonazepam, cyclobenzaprine, morphine injection, ondansetron (ZOFRAN) IV, sodium chloride flush   Resolved Hospital Problem list     ASSESSMENT & PLAN   Acute Hypoxic Hypercapnic Respiratory Failure secondary to Acute Decompensated HFpEF, AECOPD, STREPTOCOCCUS AGALACTIAE Pneumonia, &  post COVID Pneumonitis/fibrosis Hemoptysis ? Diffuse Alveolar Hemorrhage -Supplemental O2 as needed to maintain O2 saturations 88 to 92% -BiPAP as needed -Follow intermittent ABG and chest x-ray as needed -Diuresis as BP and renal function permits ~Lasix today -IV Steroids,  decreased SoluMedrol to 25m q 12h  -ESR, CRP, RF,nonrevealing thus far, ANCA pendidng -Brovana BID, Yupelri q d, PRN albuterol, Budesonide BID -Sputum culture from 01/27/21 with STREPTOCOCCUS AGALACTIAE ~ -Continue Cefazolin, add Vanco ? Resistance     Acute on chronic Diastolic HFpEF  Hypertension Hx: HLD  -Continuous cardiac monitoring -Maintain MAP greater than 65 -Diuresis as BP and renal function permits ~ hold diuresis today -Hold HCTZ, metoprolol. -Continue low dose Losartan -Atorvastatin 40 mg -Repeat 2D Echocardiogram shows Left ventricular ejection fraction, by estimation, is 50 to 55%.  Hypokinesis of septal wall. Left ventricular diastolic parameters are consistent with Grade  II diastolic dysfunction (pseudonormalization).The right ventricular size is severely enlarged. -Cardiology following, appreciate input    STREPTOCOCCUS AGALACTIAE Pneumonia -Monitor fever curve -Trend WBC's -Sputum culture on 6/3 with Strep Agalactiae -Continue Cefazolin add Vanco -Recollect sputum, CXR in AM    Contraction Metabolic Alkalosis, suspect due to IV Lasix~ improving -Monitor I&O's / urinary output -Follow BMP -Ensure adequate renal perfusion -Avoid nephrotoxic agents as able -Replace electrolytes as indicated   Abdominal Adenopathy: Known lymphoma -Oncology consulted, appreciate input -CT Abdomen/Pelvis with contrast shows increased lymphadenopathy compared to 2020  Anemia Chronic disease+acute blood loss  Monitor No anticoagulants SCDs    Acute Metabolic Encephalopathy due to Hypercapnia now improved -off Preceddex -Provide supportive care -BiPAP @ hs   Diabetes mellitus -CBGs -Sliding scale insulin & 10 units Lantus daily -Follow ICU hyper/hypoglycemia protocol -Hold home Glipizide     Best practice (right click and "Reselect all SmartList Selections" daily)  Diet:  Oral Pain/Anxiety/Delirium protocol (if indicated): No VAP protocol (if indicated): Not  indicated DVT prophylaxis: Contraindicated due to hemoptysis  GI prophylaxis: PPI Glucose control:  SSI Yes Central venous access:  N/A Arterial line:  N/A Foley:  N/A Mobility:  OOB as tolerated  PT consulted: Yes Last date of multidisciplinary goals of care discussion [02/03/21] Code Status:  Limited Code (NO CPR) Disposition: Stepdown   Level 3 follow-up    Critical care provider statement:    Critical care time (minutes):  33   Critical care time was exclusive of:  Separately billable procedures and  treating other patients   Critical care was necessary to treat or prevent imminent or  life-threatening deterioration of the following conditions: Acute hypoxemic respiratory failure, sepsis, pneumonia, multiple comorbid conditions   Critical care was time spent personally by me on the following  activities:  Development of treatment plan with patient or surrogate,  discussions with consultants, evaluation of patient's response to  treatment, examination of patient, obtaining history from patient or  surrogate, ordering and performing treatments and interventions, ordering  and review of laboratory studies and re-evaluation of patient's condition   I assumed direction of critical care for this patient from another  provider in my specialty: no      *This note was dictated using voice recognition software/Dragon.  Despite best efforts to proofread, errors can occur which can change the meaning.  Any change was purely unintentional.      Ottie Glazier, M.D.  [facility] [place]

## 2021-02-08 LAB — CBC WITH DIFFERENTIAL/PLATELET
Abs Immature Granulocytes: 1.19 10*3/uL — ABNORMAL HIGH (ref 0.00–0.07)
Basophils Absolute: 0.1 10*3/uL (ref 0.0–0.1)
Basophils Relative: 0 %
Eosinophils Absolute: 0 10*3/uL (ref 0.0–0.5)
Eosinophils Relative: 0 %
HCT: 30.2 % — ABNORMAL LOW (ref 39.0–52.0)
Hemoglobin: 9.5 g/dL — ABNORMAL LOW (ref 13.0–17.0)
Immature Granulocytes: 7 %
Lymphocytes Relative: 17 %
Lymphs Abs: 2.9 10*3/uL (ref 0.7–4.0)
MCH: 30.1 pg (ref 26.0–34.0)
MCHC: 31.5 g/dL (ref 30.0–36.0)
MCV: 95.6 fL (ref 80.0–100.0)
Monocytes Absolute: 1 10*3/uL (ref 0.1–1.0)
Monocytes Relative: 6 %
Neutro Abs: 11.7 10*3/uL — ABNORMAL HIGH (ref 1.7–7.7)
Neutrophils Relative %: 70 %
Platelets: 292 10*3/uL (ref 150–400)
RBC: 3.16 MIL/uL — ABNORMAL LOW (ref 4.22–5.81)
RDW: 15 % (ref 11.5–15.5)
Smear Review: NORMAL
WBC: 16.9 10*3/uL — ABNORMAL HIGH (ref 4.0–10.5)
nRBC: 0.1 % (ref 0.0–0.2)

## 2021-02-08 LAB — MAGNESIUM: Magnesium: 2.3 mg/dL (ref 1.7–2.4)

## 2021-02-08 LAB — BASIC METABOLIC PANEL
Anion gap: 3 — ABNORMAL LOW (ref 5–15)
BUN: 49 mg/dL — ABNORMAL HIGH (ref 8–23)
CO2: 39 mmol/L — ABNORMAL HIGH (ref 22–32)
Calcium: 8.6 mg/dL — ABNORMAL LOW (ref 8.9–10.3)
Chloride: 97 mmol/L — ABNORMAL LOW (ref 98–111)
Creatinine, Ser: 0.81 mg/dL (ref 0.61–1.24)
GFR, Estimated: >60 mL/min (ref >60)
Glucose, Bld: 129 mg/dL — ABNORMAL HIGH (ref 70–99)
Potassium: 4.7 mmol/L (ref 3.5–5.1)
Sodium: 139 mmol/L (ref 135–145)

## 2021-02-08 LAB — GLUCOSE, CAPILLARY
Glucose-Capillary: 132 mg/dL — ABNORMAL HIGH (ref 70–99)
Glucose-Capillary: 105 mg/dL — ABNORMAL HIGH (ref 70–99)
Glucose-Capillary: 234 mg/dL — ABNORMAL HIGH (ref 70–99)
Glucose-Capillary: 233 mg/dL — ABNORMAL HIGH (ref 70–99)
Glucose-Capillary: 135 mg/dL — ABNORMAL HIGH (ref 70–99)

## 2021-02-08 LAB — CULTURE, RESPIRATORY W GRAM STAIN: Gram Stain: NONE SEEN

## 2021-02-08 LAB — PHOSPHORUS: Phosphorus: 3.5 mg/dL (ref 2.5–4.6)

## 2021-02-08 NOTE — Progress Notes (Signed)
NAME:  Julian Sparks, MRN:  [id number], DOB:  1946/02/27, LOS: 33 ADMISSION DATE:  01/24/2021, INITIAL CONSULTATION DATE: 01/27/2021 REFERRING MD: Max Sane MD, CHIEF COMPLAINT: Worsening SOB  Brief Patient Description  75 y.o. male with PMH as below presenting to the ED worsening shortness of breath from his baseline associated with bilateral lower extremity swelling as well as increased abdominal girth for about 2 days. Has generalized anasarca +hypoxia + chronic venous stasis with ulceration and was seen at the vascular clinic where he had on arrival placed bilaterally to both lower extremities. He was admitted to PCU with Severe ACUTE Hypoxic and Hypercapnic Respiratory Failure due to acute severe decompensated combined systloic and diastolic heart failure on BiPAP  Pertinent  Medical History    Chronic bronchitis (HCC)   COPD (chronic obstructive pulmonary disease) ([place])   Diabetes mellitus without complication ([place])   Diastolic dysfunction   Dyspnea   Chronic venous statis   Hypertension   Significant Hospital Events: Including procedures, antibiotic start and stop dates in addition to other pertinent events   5/31: Admitted for acute diastolic heart failure 6/1: Resp distress and mental status changes, placed on biPAP 6/2: Respiratory status improved, weaned off BiPAP to HFNC 50% FiO2 @ 60L 6/3: Overnight with agitation and unable to tolerate BiPAP with thick tan secretions, resulting in  hypoxia (O2 sats 60's) during coughing episode, transferred to [facility] ~ now weaned back to HFNC, add IV steroids for AECOPD 6/5: Tolerated BiPAP for 3 hours last night 6/6: Remains on HHFNC (currently at 87% FiO2),BMP concerning for Contraction Metabolic Alkalosis due to aggressive Diuresis with Lasix ~ will hold Lasix and give Diamox + Metolazone; Sputum culture with Streptococcus Agalactiae ~ will start Ceftriaxone  6/7: Placed on BiPAP this morning due to SOB, CXR with new RUL alveolar  infiltrate (placed on Rocephin yesterday and sputum cx with Streptococcus Agalactiae. Will decrease Diamox to 500 mg daily, continue Metolazone 2.5 mg daily 6/8: On BiPAP overnight (50% FiO2) and tolerating, plan for HHFNC during day, diuresing well and contraction alkalosis continues to improve with Diamox + metolazone 6/9: Ceftriaxone switched to Cefazolin, remains on HHFNC (currently @ 65% FiO2), diuresing well, Creatinine remains stable, foley catheter removed 6/10: CT chest concerning for R>L multifocal opacity (consistent with pnuemonia vs. PAH), along with progression of previous abdominal adenopathy (consistent with Lymphoma); Oncology consulted, Holding diuresis due to soft BP and slight increase in serum Creatinine, decrease steroids 02/06/21-patient remains on high flow nasal cannula with fragmented speech, I met with family at bedside including wife and daughter.  Patient still unable to lean back in supine position and we reviewed possibly needing cardiac cath in the future however he is very nervous about this and we explained that this will be done only when he is in chronic stable state.  He still has bilateral wheezing in all lung zones on exam today.  Has not had hemoptysis during my shift today. 02/07/21- patient still on HFNC, family at bedside. Reviewed CT with them and atelcetasis on rll will start Metaneb 02/08/21- patient is weaning down on FiO2 still on HFNC  Cultures:  01/24/2021: SARS-CoV-2 PCR>> negative 01/24/2021: Influenza PCR>> negative 01/27/2021: MRSA PCR>> negative 01/27/2021: Sputum>> STREPTOCOCCUS AGALACTIAE  Antimicrobials:  Ceftriaxone 6/6>>6/9 Cefazolin 6/9>>     FiO2 (%):  [35 %-60 %] 50 %  OBJECTIVE   Blood pressure 122/69, pulse 75, temperature 97.9 F (36.6 C), temperature source Oral, resp. rate (!) 23, weight 93.1 kg, SpO2 (!) 86 %.  FiO2 (%):  [35 %-60 %] 50 %   Intake/Output Summary (Last 24 hours) at 02/08/2021 0832 Last data filed at 02/08/2021  0700 Gross per 24 hour  Intake 1564.03 ml  Output 3900 ml  Net -2335.97 ml    Filed Weights   02/01/21 0500 02/02/21 0442 02/03/21 0500  Weight: 95.5 kg 96 kg 93.1 kg    Physical Examination: GENERAL: Acute on chronically ill-appearing male, sitting up in chair , on HHFNC, no acute distress HEAD: Normocephalic, atraumatic.  EYES: Pupils equal, round, reactive to light.  No scleral icterus.  MOUTH: Moist mucosal membrane. NECK: Supple, positive JVD PULMONARY: Coarse breath sounds bilaterally, wheezing today,no rales, even, no accessory muscle use CARDIOVASCULAR: S1 and S2. Regular rate and rhythm. No murmurs, rubs, or gallops.  GASTROINTESTINAL: Soft, nontender, -distended. Positive bowel sounds.  MUSCULOSKELETAL: Trace edema bilateral lower extremity, no deformities, normal bulk and tone  NEUROLOGIC: Awake, alert and oriented x3, moves all extremities to command, no focal deficits, speech clear, pupils PERRLA SKIN: Limited exam- intact,warm,dry   Review of Systems:  A 10 point review of systems was performed and it is as noted above otherwise negative.   CXR 6/10>>There is extensive airspace opacity in the right upper and mid lung regions, slightly increased. There is patchy airspace opacity in the left mid lung, stable, and in the left base, marginally increased. There is stable cardiomegaly with a degree of pulmonary venous hypertension. There is aortic atherosclerosis. No adenopathy no bone lesions. CT Chest w/o contrast 6/10>>1. Right greater than left multifocal airspace and ground-glass opacity, most consistent with pneumonia. 2. Tiny bilateral pleural effusions. 3. Progression of massive upper abdominal adenopathy,consistent with lymphoma. 4. Development of mild thoracic adenopathy which could be reactive or related to thoracic extension of lymphoma. 5. Aortic atherosclerosis (ICD10-I70.0), coronary artery atherosclerosis and emphysema (ICD10-J43.9). 6. Pulmonary  artery enlargement suggests pulmonary arterial hypertension. 7. Interval T7 compression deformity.   Labs   CBC: Recent Labs  Lab 02/04/21 0452 02/05/21 0417 02/06/21 0614 02/07/21 0353 02/08/21 0444  WBC 11.3* 14.7* 14.4* 15.3* 16.9*  NEUTROABS  --   --   --   --  11.7*  HGB 9.6* 9.6* 9.3* 9.5* 9.5*  HCT 31.4* 30.3* 30.1* 29.5* 30.2*  MCV 97.5 94.1 96.2 93.4 95.6  PLT 214 247 261 276 292     Basic Metabolic Panel: Recent Labs  Lab 02/03/21 0346 02/04/21 0452 02/05/21 0417 02/06/21 0614 02/07/21 0353 02/08/21 0444  NA 138 137 139 140 139 139  K 3.8 4.8 4.2 4.4 4.8 4.7  CL 98 96* 100 98 97* 97*  CO2 33* 35* 34* 37* 37* 39*  GLUCOSE 195* 258* 101* 123* 138* 129*  BUN 44* 48* 51* 54* 52* 49*  CREATININE 1.12 1.09 0.94 0.89 0.90 0.81  CALCIUM 8.6* 8.5* 8.9 8.6* 8.5* 8.6*  MG 2.1  --  2.2 2.3 2.3 2.3  PHOS 4.3  --  3.4 3.7 3.8 3.5    GFR: Estimated Creatinine Clearance: 82.9 mL/min (by C-G formula based on SCr of 0.81 mg/dL). Recent Labs  Lab 02/05/21 0417 02/06/21 0614 02/07/21 0353 02/08/21 0444  WBC 14.7* 14.4* 15.3* 16.9*     Liver Function Tests: No results for input(s): AST, ALT, ALKPHOS, BILITOT, PROT, ALBUMIN in the last 168 hours. No results for input(s): LIPASE, AMYLASE in the last 168 hours. No results for input(s): AMMONIA in the last 168 hours.  ABG    Component Value Date/Time   PHART 7.34 (L) 01/27/2021 0115  PCO2ART 77 (HH) 01/27/2021 0115   PO2ART 72 (L) 01/27/2021 0115   HCO3 41.5 (H) 01/27/2021 0115   O2SAT 93.3 01/27/2021 0115     Coagulation Profile: Recent Labs  Lab 02/03/21 0527  INR 1.1     Cardiac Enzymes: No results for input(s): CKTOTAL, CKMB, CKMBINDEX, TROPONINI in the last 168 hours.  HbA1C: Hgb A1c MFr Bld  Date/Time Value Ref Range Status  01/24/2021 11:32 AM 6.8 (H) 4.8 - 5.6 % Final    Comment:    (NOTE)         Prediabetes: 5.7 - 6.4         Diabetes: >6.4         Glycemic control for adults with  diabetes: <7.0   02/05/2019 02:07 AM 7.3 (H) 4.8 - 5.6 % Final    Comment:    (NOTE) Pre diabetes:          5.7%-6.4% Diabetes:              >6.4% Glycemic control for   <7.0% adults with diabetes     CBG: Recent Labs  Lab 02/07/21 1604 02/07/21 1939 02/07/21 2328 02/08/21 0338 02/08/21 0719  GLUCAP 293* 329* 192* 132* 105*     Allergies Allergies  Allergen Reactions   Aspirin Nausea Only and Other (See Comments)    325 mg aspirin cause stomach upset   Penicillins Rash    Scheduled Meds:  arformoterol  15 mcg Nebulization BID   vitamin C  500 mg Oral Daily   atorvastatin  40 mg Oral Daily   budesonide (PULMICORT) nebulizer solution  0.5 mg Nebulization BID   Chlorhexidine Gluconate Cloth  6 each Topical Q0600   fluticasone  1 spray Each Nare BID   furosemide  40 mg Oral Daily   gabapentin  300 mg Oral TID   insulin aspart  0-20 Units Subcutaneous Q4H   insulin aspart  7 Units Subcutaneous TID AC   insulin glargine  20 Units Subcutaneous Daily   melatonin  5 mg Oral QHS   methylPREDNISolone (SOLU-MEDROL) injection  40 mg Intravenous Q8H   metoprolol succinate  12.5 mg Oral Daily   revefenacin  175 mcg Nebulization Daily   sodium chloride flush  3 mL Intravenous Q12H   traZODone  100 mg Oral QHS   Continuous Infusions:  sodium chloride     levofloxacin (LEVAQUIN) IV Stopped (02/07/21 2056)   PRN Meds:.sodium chloride, acetaminophen, albuterol, clonazepam, cyclobenzaprine, morphine injection, ondansetron (ZOFRAN) IV, sodium chloride flush   Resolved Hospital Problem list     ASSESSMENT & PLAN   Acute Hypoxic Hypercapnic Respiratory Failure secondary to Acute Decompensated HFpEF, AECOPD, STREPTOCOCCUS AGALACTIAE Pneumonia, &  post COVID Pneumonitis/fibrosis Hemoptysis ? Diffuse Alveolar Hemorrhage -Supplemental O2 as needed to maintain O2 saturations 88 to 92% -BiPAP as needed -Follow intermittent ABG and chest x-ray as needed -Diuresis as BP and renal  function permits ~Lasix today -IV Steroids, decreased SoluMedrol to 48m q 12h  -ESR, CRP, RF,nonrevealing thus far, ANCA pendidng -Brovana BID, Yupelri q d, PRN albuterol, Budesonide BID -Sputum culture from 01/27/21 with STREPTOCOCCUS AGALACTIAE ~ -Continue Cefazolin, add Vanco ? Resistance     Acute on chronic Diastolic HFpEF  Hypertension Hx: HLD  -Continuous cardiac monitoring -Maintain MAP greater than 65 -Diuresis as BP and renal function permits ~ hold diuresis today -Hold HCTZ, metoprolol. -Continue low dose Losartan -Atorvastatin 40 mg -Repeat 2D Echocardiogram shows Left ventricular ejection fraction, by estimation, is 50 to 55%.  Hypokinesis  of septal wall. Left ventricular diastolic parameters are consistent with Grade II diastolic dysfunction (pseudonormalization).The right ventricular size is severely enlarged. -Cardiology following, appreciate input    STREPTOCOCCUS AGALACTIAE Pneumonia -Monitor fever curve -Trend WBC's -Sputum culture on 6/3 with Strep Agalactiae -Continue Cefazolin add Vanco -Recollect sputum, CXR in AM    Contraction Metabolic Alkalosis, suspect due to IV Lasix~ improving -Monitor I&O's / urinary output -Follow BMP -Ensure adequate renal perfusion -Avoid nephrotoxic agents as able -Replace electrolytes as indicated   Abdominal Adenopathy: Known lymphoma -Oncology consulted, appreciate input -CT Abdomen/Pelvis with contrast shows increased lymphadenopathy compared to 2020  Anemia Chronic disease+acute blood loss  Monitor No anticoagulants SCDs    Acute Metabolic Encephalopathy due to Hypercapnia now improved -off Preceddex -Provide supportive care -BiPAP @ hs   Diabetes mellitus -CBGs -Sliding scale insulin & 10 units Lantus daily -Follow ICU hyper/hypoglycemia protocol -Hold home Glipizide     Best practice (right click and "Reselect all SmartList Selections" daily)  Diet:  Oral Pain/Anxiety/Delirium protocol (if  indicated): No VAP protocol (if indicated): Not indicated DVT prophylaxis: Contraindicated due to hemoptysis  GI prophylaxis: PPI Glucose control:  SSI Yes Central venous access:  N/A Arterial line:  N/A Foley:  N/A Mobility:  OOB as tolerated  PT consulted: Yes Last date of multidisciplinary goals of care discussion [02/03/21] Code Status:  Limited Code (NO CPR) Disposition: Stepdown   Level 3 follow-up    Critical care provider statement:    Critical care time (minutes):  33   Critical care time was exclusive of:  Separately billable procedures and  treating other patients   Critical care was necessary to treat or prevent imminent or  life-threatening deterioration of the following conditions: Acute hypoxemic respiratory failure, sepsis, pneumonia, multiple comorbid conditions   Critical care was time spent personally by me on the following  activities:  Development of treatment plan with patient or surrogate,  discussions with consultants, evaluation of patient's response to  treatment, examination of patient, obtaining history from patient or  surrogate, ordering and performing treatments and interventions, ordering  and review of laboratory studies and re-evaluation of patient's condition   I assumed direction of critical care for this patient from another  provider in my specialty: no      *This note was dictated using voice recognition software/Dragon.  Despite best efforts to proofread, errors can occur which can change the meaning.  Any change was purely unintentional.      Ottie Glazier, M.D.  [facility]

## 2021-02-08 NOTE — Progress Notes (Signed)
Inpatient Diabetes Program Recommendations  AACE/ADA: New Consensus Statement on Inpatient Glycemic Control   Target Ranges:  Prepandial:   less than 140 mg/dL      Peak postprandial:   less than 180 mg/dL (1-2 hours)      Critically ill patients:  140 - 180 mg/dL   Results for RAIFE, LIZER (MRN [id number]) as of 02/08/2021 09:01  Ref. Range 02/07/2021 07:25 02/07/2021 11:19 02/07/2021 16:04 02/07/2021 19:39 02/07/2021 23:28 02/08/2021 03:38 02/08/2021 07:19  Glucose-Capillary Latest Ref Range: 70 - 99 mg/dL 164 (H) 280 (H) 293 (H) 329 (H) 192 (H) 132 (H) 105 (H)    Review of Glycemic Control  Diabetes history: DM2 Outpatient Diabetes medications: Glipizide 5 mg BID, Metformin 500 mg BID Current orders for Inpatient glycemic control: Lantus 20 units daily, Novolog 7 units TID with meals, Novolog 0-20 units Q4H; Solumedrol 40 mg Q8H  Inpatient Diabetes Program Recommendations:    Insulin: If steroids are continued, please consider increasing meal coverage to Novolog 12 units TID with meals if patient eats at least 50% of meals.   Thanks, Barnie Alderman, RN, MSN, CDE Diabetes Coordinator Inpatient Diabetes Program [phone] (Team Pager from 8am to 5pm)

## 2021-02-08 NOTE — Progress Notes (Signed)
Patient does not want to wear bipap mask for the night.  Patient placed on heated HFNC 50L, FIO2: 100%.  Patient appears to be tolerating well.

## 2021-02-08 NOTE — Progress Notes (Signed)
Progress Note  Patient Name: Julian Sparks Date of Encounter: 02/08/2021  [facility] Cardiologist: Dr. Rockey Situ  Subjective   Dyspnea unchanged. No chest pain. BiPAP overnight, now on HFNC. Vitals and labs stable. Gets very thirsty with BiPAP, frustrated.   Inpatient Medications    Scheduled Meds:  arformoterol  15 mcg Nebulization BID   vitamin C  500 mg Oral Daily   atorvastatin  40 mg Oral Daily   budesonide (PULMICORT) nebulizer solution  0.5 mg Nebulization BID   Chlorhexidine Gluconate Cloth  6 each Topical Q0600   fluticasone  1 spray Each Nare BID   furosemide  40 mg Oral Daily   gabapentin  300 mg Oral TID   insulin aspart  0-20 Units Subcutaneous Q4H   insulin aspart  7 Units Subcutaneous TID AC   insulin glargine  20 Units Subcutaneous Daily   melatonin  5 mg Oral QHS   methylPREDNISolone (SOLU-MEDROL) injection  40 mg Intravenous Q8H   metoprolol succinate  12.5 mg Oral Daily   revefenacin  175 mcg Nebulization Daily   sodium chloride flush  3 mL Intravenous Q12H   traZODone  100 mg Oral QHS   Continuous Infusions:  sodium chloride     levofloxacin (LEVAQUIN) IV Stopped (02/07/21 2056)   PRN Meds: sodium chloride, acetaminophen, albuterol, clonazepam, cyclobenzaprine, morphine injection, ondansetron (ZOFRAN) IV, sodium chloride flush   Vital Signs    Vitals:   02/08/21 0500 02/08/21 0600 02/08/21 0641 02/08/21 0800  BP: 117/68 (!) 115/100  122/69  Pulse: (!) 59 69  75  Resp: 16 16  (!) 23  Temp:    97.9 F (36.6 C)  TempSrc:    Oral  SpO2: 93% 99% 94% (!) 86%  Weight:        Intake/Output Summary (Last 24 hours) at 02/08/2021 0914 Last data filed at 02/08/2021 0700 Gross per 24 hour  Intake 1444.03 ml  Output 3900 ml  Net -2455.97 ml    Last 3 Weights 02/03/2021 02/02/2021 02/01/2021  Weight (lbs) 205 lb 4 oz 211 lb 10.3 oz 210 lb 8.6 oz  Weight (kg) 93.1 kg 96 kg 95.5 kg      Telemetry    Sinus rhythm with PVCs and episodes of  idioventricular rhythm - Personally Reviewed  ECG    No new tracing- Personally Reviewed  Physical Exam   GEN:  No acute distress Neck: No JVD Cardiac: RRR Respiratory:  Rhonchorous breath sounds throughout GI: Soft, nontender, non-distended  MS: Trace edema R>L; No deformity Neuro:  Nonfocal  Psych: Normal affect   Labs    High Sensitivity Troponin:   Recent Labs  Lab 01/24/21 1132  TROPONINIHS 13       Chemistry Recent Labs  Lab 02/06/21 0614 02/07/21 0353 02/08/21 0444  NA 140 139 139  K 4.4 4.8 4.7  CL 98 97* 97*  CO2 37* 37* 39*  GLUCOSE 123* 138* 129*  BUN 54* 52* 49*  CREATININE 0.89 0.90 0.81  CALCIUM 8.6* 8.5* 8.6*  GFRNONAA >60 >60 >60  ANIONGAP 5 5 3*      Hematology Recent Labs  Lab 02/06/21 0614 02/07/21 0353 02/08/21 0444  WBC 14.4* 15.3* 16.9*  RBC 3.13* 3.16* 3.16*  HGB 9.3* 9.5* 9.5*  HCT 30.1* 29.5* 30.2*  MCV 96.2 93.4 95.6  MCH 29.7 30.1 30.1  MCHC 30.9 32.2 31.5  RDW 15.0 14.8 15.0  PLT 261 276 292     BNP Recent Labs  Lab 02/03/21  0346  BNP 163.9*      DDimer No results for input(s): DDIMER in the last 168 hours.   Radiology    No results found.   Cardiac Studies   Echo 12/2020 Reviewed, EF 55 to 60%  Patient Profile     75 y.o. male with history of HFpEF, COPD, hypoxic respiratory failure presenting with shortness of breath, diagnosed with hypoxia and hypercapnic respiratory failure.  Being seen for HFpEF.  Assessment & Plan    1.  HFpEF: -The patient's symptoms manifested mostly by right-sided heart failure with suspected pulmonary hypertension.  He was diuresed initially with IV furosemide and then subsequently was switched to Diamox and metolazone  -Narrow therapeutic window, look to keep on the dry side -Continue Lasix 40 mg daily to maintain euvolemia -Labs stable -Based on his clinical progress and chest x-ray findings, suspect that most of his respiratory failure at the present time is due to  his lung disease -It has been felt there is not much added benefit from a right heart catheterization at this point especially with this degree of oxygen requirement and abnormal chest x-ray  2.  Acute hypoxic and hypercapnic respiratory failure: -Multifactorial including COPD exacerbation, Streptococcus agalactiae pneumonia, post-COVID pneumonitis/fibrosis, and HFpEF  -Suspect a significant component of his continued respiratory distress is pulmonary in etiology with a degree of volume overload -He remains on high flow nasal cannula -Management as per pulmonary medicine, critical care team  3.  Abdominal adenopathy:  -History of lymphoma -The patient was seen by oncology with plans to start treatment in the near future but it was not felt that his respiratory failure is related to this       Signed, Christell Faith, PA-C  02/08/2021, 9:14 AM

## 2021-02-09 ENCOUNTER — Ambulatory Visit: Payer: Medicare HMO | Admitting: Family

## 2021-02-09 DIAGNOSIS — J811 Chronic pulmonary edema: Secondary | ICD-10-CM

## 2021-02-09 LAB — GLUCOSE, CAPILLARY
Glucose-Capillary: 109 mg/dL — ABNORMAL HIGH (ref 70–99)
Glucose-Capillary: 115 mg/dL — ABNORMAL HIGH (ref 70–99)
Glucose-Capillary: 119 mg/dL — ABNORMAL HIGH (ref 70–99)
Glucose-Capillary: 125 mg/dL — ABNORMAL HIGH (ref 70–99)
Glucose-Capillary: 137 mg/dL — ABNORMAL HIGH (ref 70–99)
Glucose-Capillary: 227 mg/dL — ABNORMAL HIGH (ref 70–99)

## 2021-02-09 LAB — BASIC METABOLIC PANEL
Anion gap: 9 (ref 5–15)
BUN: 42 mg/dL — ABNORMAL HIGH (ref 8–23)
CO2: 35 mmol/L — ABNORMAL HIGH (ref 22–32)
Calcium: 8.8 mg/dL — ABNORMAL LOW (ref 8.9–10.3)
Chloride: 93 mmol/L — ABNORMAL LOW (ref 98–111)
Creatinine, Ser: 0.84 mg/dL (ref 0.61–1.24)
GFR, Estimated: >60 mL/min (ref >60)
Glucose, Bld: 141 mg/dL — ABNORMAL HIGH (ref 70–99)
Potassium: 4.7 mmol/L (ref 3.5–5.1)
Sodium: 137 mmol/L (ref 135–145)

## 2021-02-09 LAB — CBC WITH DIFFERENTIAL/PLATELET
Abs Immature Granulocytes: 1.57 10*3/uL — ABNORMAL HIGH (ref 0.00–0.07)
Basophils Absolute: 0.1 10*3/uL (ref 0.0–0.1)
Basophils Relative: 1 %
Eosinophils Absolute: 0 10*3/uL (ref 0.0–0.5)
Eosinophils Relative: 0 %
HCT: 33.4 % — ABNORMAL LOW (ref 39.0–52.0)
Hemoglobin: 10.7 g/dL — ABNORMAL LOW (ref 13.0–17.0)
Immature Granulocytes: 7 %
Lymphocytes Relative: 19 %
Lymphs Abs: 4.2 10*3/uL — ABNORMAL HIGH (ref 0.7–4.0)
MCH: 29.9 pg (ref 26.0–34.0)
MCHC: 32 g/dL (ref 30.0–36.0)
MCV: 93.3 fL (ref 80.0–100.0)
Monocytes Absolute: 1 10*3/uL (ref 0.1–1.0)
Monocytes Relative: 5 %
Neutro Abs: 15.1 10*3/uL — ABNORMAL HIGH (ref 1.7–7.7)
Neutrophils Relative %: 68 %
Platelets: 312 10*3/uL (ref 150–400)
RBC: 3.58 MIL/uL — ABNORMAL LOW (ref 4.22–5.81)
RDW: 15.4 % (ref 11.5–15.5)
Smear Review: NORMAL
WBC: 22.1 10*3/uL — ABNORMAL HIGH (ref 4.0–10.5)
nRBC: 0 % (ref 0.0–0.2)

## 2021-02-09 LAB — PHOSPHORUS: Phosphorus: 4.5 mg/dL (ref 2.5–4.6)

## 2021-02-09 LAB — MAGNESIUM: Magnesium: 2.3 mg/dL (ref 1.7–2.4)

## 2021-02-09 NOTE — Progress Notes (Signed)
NAME:  Julian Sparks, MRN:  [id number], DOB:  1946-03-04, LOS: 16 ADMISSION DATE:  01/24/2021, INITIAL CONSULTATION DATE: 01/27/2021 REFERRING MD: Max Sane MD, CHIEF COMPLAINT: Worsening SOB  Brief Patient Description  75 y.o. male with PMH as below presenting to the ED worsening shortness of breath from his baseline associated with bilateral lower extremity swelling as well as increased abdominal girth for about 2 days. Has generalized anasarca +hypoxia + chronic venous stasis with ulceration and was seen at the vascular clinic where he had on arrival placed bilaterally to both lower extremities. He was admitted to PCU with Severe ACUTE Hypoxic and Hypercapnic Respiratory Failure due to acute severe decompensated combined systloic and diastolic heart failure on BiPAP  Pertinent  Medical History    Chronic bronchitis (HCC)   COPD (chronic obstructive pulmonary disease) ([place])   Diabetes mellitus without complication ([place])   Diastolic dysfunction   Dyspnea   Chronic venous statis   Hypertension   Significant Hospital Events: Including procedures, antibiotic start and stop dates in addition to other pertinent events   5/31: Admitted for acute diastolic heart failure 6/1: Resp distress and mental status changes, placed on biPAP 6/2: Respiratory status improved, weaned off BiPAP to HFNC 50% FiO2 @ 60L 6/3: Overnight with agitation and unable to tolerate BiPAP with thick tan secretions, resulting in  hypoxia (O2 sats 60's) during coughing episode, transferred to [facility] ~ now weaned back to HFNC, add IV steroids for AECOPD 6/5: Tolerated BiPAP for 3 hours last night 6/6: Remains on HHFNC (currently at 87% FiO2),BMP concerning for Contraction Metabolic Alkalosis due to aggressive Diuresis with Lasix ~ will hold Lasix and give Diamox + Metolazone; Sputum culture with Streptococcus Agalactiae ~ will start Ceftriaxone  6/7: Placed on BiPAP this morning due to SOB, CXR with new RUL alveolar  infiltrate (placed on Rocephin yesterday and sputum cx with Streptococcus Agalactiae. Will decrease Diamox to 500 mg daily, continue Metolazone 2.5 mg daily 6/8: On BiPAP overnight (50% FiO2) and tolerating, plan for HHFNC during day, diuresing well and contraction alkalosis continues to improve with Diamox + metolazone 6/9: Ceftriaxone switched to Cefazolin, remains on HHFNC (currently @ 65% FiO2), diuresing well, Creatinine remains stable, foley catheter removed 6/10: CT chest concerning for R>L multifocal opacity (consistent with pnuemonia vs. PAH), along with progression of previous abdominal adenopathy (consistent with Lymphoma); Oncology consulted, Holding diuresis due to soft BP and slight increase in serum Creatinine, decrease steroids 02/06/21-patient remains on high flow nasal cannula with fragmented speech, I met with family at bedside including wife and daughter.  Patient still unable to lean back in supine position and we reviewed possibly needing cardiac cath in the future however he is very nervous about this and we explained that this will be done only when he is in chronic stable state.  He still has bilateral wheezing in all lung zones on exam today.  Has not had hemoptysis during my shift today. 02/07/21- patient still on HFNC, family at bedside. Reviewed CT with them and atelcetasis on rll will start Metaneb 02/08/21- patient is weaning down on FiO2 still on HFNC 02/09/21- patient weaned to bubbler 8L/min via [place].  Plan to optimize for [facility] transfer.   Cultures:  01/24/2021: SARS-CoV-2 PCR>> negative 01/24/2021: Influenza PCR>> negative 01/27/2021: MRSA PCR>> negative 01/27/2021: Sputum>> STREPTOCOCCUS AGALACTIAE  Antimicrobials:  Ceftriaxone 6/6>>6/9 Cefazolin 6/9>>     FiO2 (%):  [32 %-100 %] 71 %  OBJECTIVE   Blood pressure (!) 93/56, pulse 70, temperature 98.6  F (37 C), resp. rate 16, weight 93.1 kg, SpO2 95 %.    FiO2 (%):  [32 %-100 %] 71 %   Intake/Output Summary (Last  24 hours) at 02/09/2021 0926 Last data filed at 02/09/2021 0900 Gross per 24 hour  Intake 1260 ml  Output 4450 ml  Net -3190 ml    Filed Weights   02/01/21 0500 02/02/21 0442 02/03/21 0500  Weight: 95.5 kg 96 kg 93.1 kg    Physical Examination: GENERAL: Acute on chronically ill-appearing male, sitting up in chair , on bubbler high flow HEAD: Normocephalic, atraumatic.  EYES: Pupils equal, round, reactive to light.  No scleral icterus.  MOUTH: Moist mucosal membrane. NECK: Supple, positive JVD PULMONARY: Coarse breath sounds bilaterally, wheezing today,no rales, even, no accessory muscle use CARDIOVASCULAR: S1 and S2. Regular rate and rhythm. No murmurs, rubs, or gallops.  GASTROINTESTINAL: Soft, nontender, -distended. Positive bowel sounds.  MUSCULOSKELETAL: Trace edema bilateral lower extremity, no deformities, normal bulk and tone  NEUROLOGIC: Awake, alert and oriented x3, moves all extremities to command, no focal deficits, speech clear, pupils PERRLA SKIN: Limited exam- intact,warm,dry   Review of Systems:  A 10 point review of systems was performed and it is as noted above otherwise negative.   CXR 6/10>>There is extensive airspace opacity in the right upper and mid lung regions, slightly increased. There is patchy airspace opacity in the left mid lung, stable, and in the left base, marginally increased. There is stable cardiomegaly with a degree of pulmonary venous hypertension. There is aortic atherosclerosis. No adenopathy no bone lesions. CT Chest w/o contrast 6/10>>1. Right greater than left multifocal airspace and ground-glass opacity, most consistent with pneumonia. 2. Tiny bilateral pleural effusions. 3. Progression of massive upper abdominal adenopathy,consistent with lymphoma. 4. Development of mild thoracic adenopathy which could be reactive or related to thoracic extension of lymphoma. 5. Aortic atherosclerosis (ICD10-I70.0), coronary artery atherosclerosis  and emphysema (ICD10-J43.9). 6. Pulmonary artery enlargement suggests pulmonary arterial hypertension. 7. Interval T7 compression deformity.   Labs   CBC: Recent Labs  Lab 02/05/21 0417 02/06/21 0614 02/07/21 0353 02/08/21 0444 02/09/21 0602  WBC 14.7* 14.4* 15.3* 16.9* 22.1*  NEUTROABS  --   --   --  11.7* 15.1*  HGB 9.6* 9.3* 9.5* 9.5* 10.7*  HCT 30.3* 30.1* 29.5* 30.2* 33.4*  MCV 94.1 96.2 93.4 95.6 93.3  PLT 247 261 276 292 312     Basic Metabolic Panel: Recent Labs  Lab 02/05/21 0417 02/06/21 0614 02/07/21 0353 02/08/21 0444 02/09/21 0602  NA 139 140 139 139 137  K 4.2 4.4 4.8 4.7 4.7  CL 100 98 97* 97* 93*  CO2 34* 37* 37* 39* 35*  GLUCOSE 101* 123* 138* 129* 141*  BUN 51* 54* 52* 49* 42*  CREATININE 0.94 0.89 0.90 0.81 0.84  CALCIUM 8.9 8.6* 8.5* 8.6* 8.8*  MG 2.2 2.3 2.3 2.3 2.3  PHOS 3.4 3.7 3.8 3.5 4.5    GFR: Estimated Creatinine Clearance: 79.9 mL/min (by C-G formula based on SCr of 0.84 mg/dL). Recent Labs  Lab 02/06/21 0614 02/07/21 0353 02/08/21 0444 02/09/21 0602  WBC 14.4* 15.3* 16.9* 22.1*     Liver Function Tests: No results for input(s): AST, ALT, ALKPHOS, BILITOT, PROT, ALBUMIN in the last 168 hours. No results for input(s): LIPASE, AMYLASE in the last 168 hours. No results for input(s): AMMONIA in the last 168 hours.  ABG    Component Value Date/Time   PHART 7.34 (L) 01/27/2021 0115   PCO2ART 77 (HH)  01/27/2021 0115   PO2ART 72 (L) 01/27/2021 0115   HCO3 41.5 (H) 01/27/2021 0115   O2SAT 93.3 01/27/2021 0115     Coagulation Profile: Recent Labs  Lab 02/03/21 0527  INR 1.1     Cardiac Enzymes: No results for input(s): CKTOTAL, CKMB, CKMBINDEX, TROPONINI in the last 168 hours.  HbA1C: Hgb A1c MFr Bld  Date/Time Value Ref Range Status  01/24/2021 11:32 AM 6.8 (H) 4.8 - 5.6 % Final    Comment:    (NOTE)         Prediabetes: 5.7 - 6.4         Diabetes: >6.4         Glycemic control for adults with diabetes:  <7.0   02/05/2019 02:07 AM 7.3 (H) 4.8 - 5.6 % Final    Comment:    (NOTE) Pre diabetes:          5.7%-6.4% Diabetes:              >6.4% Glycemic control for   <7.0% adults with diabetes     CBG: Recent Labs  Lab 02/08/21 1530 02/08/21 1948 02/09/21 0011 02/09/21 0324 02/09/21 0746  GLUCAP 233* 135* 119* 137* 109*     Allergies Allergies  Allergen Reactions   Aspirin Nausea Only and Other (See Comments)    325 mg aspirin cause stomach upset   Penicillins Rash    Scheduled Meds:  arformoterol  15 mcg Nebulization BID   vitamin C  500 mg Oral Daily   atorvastatin  40 mg Oral Daily   budesonide (PULMICORT) nebulizer solution  0.5 mg Nebulization BID   Chlorhexidine Gluconate Cloth  6 each Topical Q0600   fluticasone  1 spray Each Nare BID   furosemide  40 mg Oral Daily   gabapentin  300 mg Oral TID   insulin aspart  0-20 Units Subcutaneous Q4H   insulin aspart  7 Units Subcutaneous TID AC   insulin glargine  20 Units Subcutaneous Daily   melatonin  5 mg Oral QHS   methylPREDNISolone (SOLU-MEDROL) injection  40 mg Intravenous Q8H   metoprolol succinate  12.5 mg Oral Daily   revefenacin  175 mcg Nebulization Daily   sodium chloride flush  3 mL Intravenous Q12H   traZODone  100 mg Oral QHS   Continuous Infusions:  sodium chloride     levofloxacin (LEVAQUIN) IV Stopped (02/08/21 1758)   PRN Meds:.sodium chloride, acetaminophen, albuterol, clonazepam, cyclobenzaprine, morphine injection, ondansetron (ZOFRAN) IV, sodium chloride flush   Resolved Hospital Problem list     ASSESSMENT & PLAN   Acute Hypoxic Hypercapnic Respiratory Failure secondary to Acute Decompensated HFpEF, AECOPD, STREPTOCOCCUS AGALACTIAE  -Supplemental O2 as needed to maintain O2 saturations 88 to 92% -BiPAP as needed -Follow intermittent ABG and chest x-ray as needed -Diuresis as BP and renal function permits ~Lasix today -IV Steroids, decreased SoluMedrol to 29m q 12h  -ESR, CRP,  RF,nonrevealing thus far, ANCA pendidng -Brovana BID, Yupelri q d, PRN albuterol, Budesonide BID -Sputum culture from 01/27/21 with STREPTOCOCCUS AGALACTIAE ~ -Continue Cefazolin     Acute on chronic Diastolic HFpEF  Hypertension Hx: HLD  -Continuous cardiac monitoring -Maintain MAP greater than 65 -Diuresis as BP and renal function permits ~ hold diuresis today -Hold HCTZ, metoprolol. -Continue low dose Losartan -Atorvastatin 40 mg -Repeat 2D Echocardiogram shows Left ventricular ejection fraction, by estimation, is 50 to 55%.  Hypokinesis of septal wall. Left ventricular diastolic parameters are consistent with Grade II diastolic dysfunction (pseudonormalization).The right ventricular  size is severely enlarged. -Cardiology following, appreciate input    STREPTOCOCCUS AGALACTIAE Pneumonia -Monitor fever curve -Trend WBC's -Sputum culture on 6/3 with Strep Agalactiae -Continue Cefazolin add Vanco -Recollect sputum, CXR in AM    Contraction Metabolic Alkalosis, suspect due to IV Lasix~ improving -Monitor I&O's / urinary output -Follow BMP -Ensure adequate renal perfusion -Avoid nephrotoxic agents as able -Replace electrolytes as indicated   Abdominal Adenopathy: Known lymphoma -Oncology consulted, appreciate input -CT Abdomen/Pelvis with contrast shows increased lymphadenopathy compared to 2020  Anemia Chronic disease+acute blood loss  Monitor No anticoagulants SCDs    Acute Metabolic Encephalopathy due to Hypercapnia now improved -off Preceddex -Provide supportive care -BiPAP @ hs   Diabetes mellitus -CBGs -Sliding scale insulin & 10 units Lantus daily -Follow ICU hyper/hypoglycemia protocol -Hold home Glipizide     Best practice (right click and "Reselect all SmartList Selections" daily)  Diet:  Oral Pain/Anxiety/Delirium protocol (if indicated): No VAP protocol (if indicated): Not indicated DVT prophylaxis: Contraindicated due to hemoptysis  GI  prophylaxis: PPI Glucose control:  SSI Yes Central venous access:  N/A Arterial line:  N/A Foley:  N/A Mobility:  OOB as tolerated  PT consulted: Yes Last date of multidisciplinary goals of care discussion [02/03/21] Code Status:  Limited Code (NO CPR) Disposition: Stepdown   Level 3 follow-up    Critical care provider statement:    Critical care time (minutes):  33   Critical care time was exclusive of:  Separately billable procedures and  treating other patients   Critical care was necessary to treat or prevent imminent or  life-threatening deterioration of the following conditions: Acute hypoxemic respiratory failure, sepsis, pneumonia, multiple comorbid conditions   Critical care was time spent personally by me on the following  activities:  Development of treatment plan with patient or surrogate,  discussions with consultants, evaluation of patient's response to  treatment, examination of patient, obtaining history from patient or  surrogate, ordering and performing treatments and interventions, ordering  and review of laboratory studies and re-evaluation of patient's condition   I assumed direction of critical care for this patient from another  provider in my specialty: no      *This note was dictated using voice recognition software/Dragon.  Despite best efforts to proofread, errors can occur which can change the meaning.  Any change was purely unintentional.      Ottie Glazier, M.D.  Pulmonary [facility] [place]

## 2021-02-09 NOTE — Progress Notes (Signed)
Progress Note  Patient Name: Julian Sparks Date of Encounter: 02/09/2021  [facility] Cardiologist: Dr. Rockey Situ  Subjective   Says he feels "fantastic." Reports his dyspnea is a little improved. No chest pain. Declined BiPAP over night. Documented UOP 2.9 L for the past 24 hours with a net - 32 L for the admission. No recent weights.   Inpatient Medications    Scheduled Meds:  arformoterol  15 mcg Nebulization BID   vitamin C  500 mg Oral Daily   atorvastatin  40 mg Oral Daily   budesonide (PULMICORT) nebulizer solution  0.5 mg Nebulization BID   Chlorhexidine Gluconate Cloth  6 each Topical Q0600   fluticasone  1 spray Each Nare BID   furosemide  40 mg Oral Daily   gabapentin  300 mg Oral TID   insulin aspart  0-20 Units Subcutaneous Q4H   insulin aspart  7 Units Subcutaneous TID AC   insulin glargine  20 Units Subcutaneous Daily   melatonin  5 mg Oral QHS   methylPREDNISolone (SOLU-MEDROL) injection  40 mg Intravenous Q8H   metoprolol succinate  12.5 mg Oral Daily   revefenacin  175 mcg Nebulization Daily   sodium chloride flush  3 mL Intravenous Q12H   traZODone  100 mg Oral QHS   Continuous Infusions:  sodium chloride     levofloxacin (LEVAQUIN) IV Stopped (02/08/21 1758)   PRN Meds: sodium chloride, acetaminophen, albuterol, clonazepam, cyclobenzaprine, morphine injection, ondansetron (ZOFRAN) IV, sodium chloride flush   Vital Signs    Vitals:   02/09/21 0400 02/09/21 0500 02/09/21 0600 02/09/21 0728  BP: 90/66 (!) 99/52 (!) 93/56   Pulse: 64 69 70   Resp: 19 20 16    Temp: 98.6 F (37 C)     TempSrc:      SpO2: 94% 93% 90% 94%  Weight:        Intake/Output Summary (Last 24 hours) at 02/09/2021 0802 Last data filed at 02/09/2021 0500 Gross per 24 hour  Intake 1140 ml  Output 4100 ml  Net -2960 ml    Last 3 Weights 02/03/2021 02/02/2021 02/01/2021  Weight (lbs) 205 lb 4 oz 211 lb 10.3 oz 210 lb 8.6 oz  Weight (kg) 93.1 kg 96 kg 95.5 kg      Telemetry     Sinus rhythm with PVCs and episodes of idioventricular rhythm - Personally Reviewed  ECG    No new tracing- Personally Reviewed  Physical Exam   GEN:  No acute distress Neck: No JVD Cardiac: RRR Respiratory:  Rhonchorous breath sounds throughout, on HFNC GI: Soft, nontender, non-distended  MS: Trace edema R>L; No deformity Neuro:  Nonfocal  Psych: Normal affect   Labs    High Sensitivity Troponin:   Recent Labs  Lab 01/24/21 1132  TROPONINIHS 13       Chemistry Recent Labs  Lab 02/07/21 0353 02/08/21 0444 02/09/21 0602  NA 139 139 137  K 4.8 4.7 4.7  CL 97* 97* 93*  CO2 37* 39* 35*  GLUCOSE 138* 129* 141*  BUN 52* 49* 42*  CREATININE 0.90 0.81 0.84  CALCIUM 8.5* 8.6* 8.8*  GFRNONAA >60 >60 >60  ANIONGAP 5 3* 9      Hematology Recent Labs  Lab 02/07/21 0353 02/08/21 0444 02/09/21 0602  WBC 15.3* 16.9* 22.1*  RBC 3.16* 3.16* 3.58*  HGB 9.5* 9.5* 10.7*  HCT 29.5* 30.2* 33.4*  MCV 93.4 95.6 93.3  MCH 30.1 30.1 29.9  MCHC 32.2 31.5 32.0  RDW 14.8 15.0 15.4  PLT 276 292 312     BNP Recent Labs  Lab 02/03/21 0346  BNP 163.9*      DDimer No results for input(s): DDIMER in the last 168 hours.   Radiology    No results found.   Cardiac Studies   Echo 12/2020 Reviewed, EF 55 to 60%  Patient Profile     75 y.o. male with history of HFpEF, COPD, hypoxic respiratory failure presenting with shortness of breath, diagnosed with hypoxia and hypercapnic respiratory failure.  Being seen for HFpEF.  Assessment & Plan    1.  HFpEF: -The patient's symptoms manifested mostly by right-sided heart failure with suspected pulmonary hypertension.  He was diuresed initially with IV furosemide and then subsequently was switched to Diamox and metolazone  -Narrow therapeutic window, look to keep on the dry side -Stable from a cardiac perspective  -Continue Lasix 40 mg daily to maintain euvolemia -Labs stable -Based on his clinical progress and chest  x-ray findings, suspect that most of his respiratory failure at the present time is due to his lung disease -Not much added benefit from a right heart catheterization at this point especially with this degree of oxygen requirement and abnormal chest x-ray  2.  Acute hypoxic and hypercapnic respiratory failure: -Multifactorial including COPD exacerbation, Streptococcus agalactiae pneumonia, post-COVID pneumonitis/fibrosis, and HFpEF  -Suspect a significant component of his continued respiratory distress is pulmonary in etiology with a degree of volume overload -He remains on nocturnal BiPAP and high flow nasal cannula during the day -Management as per pulmonary medicine, critical care team  3.  Abdominal adenopathy:  -History of lymphoma -The patient was seen by oncology with plans to start treatment in the near future but it was not felt that his respiratory failure is related to this  4. HLD: -Lipitor        Signed, Christell Faith, PA-C  02/09/2021, 8:02 AM

## 2021-02-09 NOTE — Progress Notes (Signed)
[facility] acceptance note  Non billable note  pneumonia was initially unstable on high flow nasal canula now on 8L stablespeaking in full sentences. he has chronic abdmominal lymphoma  [facility] to assume care 6/17  No charge  Eppie Gibson

## 2021-02-10 ENCOUNTER — Inpatient Hospital Stay: Payer: Medicare HMO

## 2021-02-10 DIAGNOSIS — J90 Pleural effusion, not elsewhere classified: Secondary | ICD-10-CM

## 2021-02-10 DIAGNOSIS — G9341 Metabolic encephalopathy: Secondary | ICD-10-CM

## 2021-02-10 DIAGNOSIS — J9622 Acute and chronic respiratory failure with hypercapnia: Secondary | ICD-10-CM | POA: Diagnosis not present

## 2021-02-10 DIAGNOSIS — J9621 Acute and chronic respiratory failure with hypoxia: Secondary | ICD-10-CM | POA: Diagnosis not present

## 2021-02-10 DIAGNOSIS — J9601 Acute respiratory failure with hypoxia: Secondary | ICD-10-CM | POA: Diagnosis not present

## 2021-02-10 DIAGNOSIS — C8300 Small cell B-cell lymphoma, unspecified site: Secondary | ICD-10-CM | POA: Diagnosis not present

## 2021-02-10 DIAGNOSIS — E873 Alkalosis: Secondary | ICD-10-CM

## 2021-02-10 DIAGNOSIS — I2609 Other pulmonary embolism with acute cor pulmonale: Secondary | ICD-10-CM | POA: Diagnosis not present

## 2021-02-10 DIAGNOSIS — I5033 Acute on chronic diastolic (congestive) heart failure: Secondary | ICD-10-CM | POA: Diagnosis not present

## 2021-02-10 DIAGNOSIS — G928 Other toxic encephalopathy: Secondary | ICD-10-CM

## 2021-02-10 LAB — CBC WITH DIFFERENTIAL/PLATELET
Abs Immature Granulocytes: 1.43 10*3/uL — ABNORMAL HIGH (ref 0.00–0.07)
Basophils Absolute: 0.1 10*3/uL (ref 0.0–0.1)
Basophils Relative: 1 %
Eosinophils Absolute: 0 10*3/uL (ref 0.0–0.5)
Eosinophils Relative: 0 %
HCT: 33 % — ABNORMAL LOW (ref 39.0–52.0)
Hemoglobin: 10.5 g/dL — ABNORMAL LOW (ref 13.0–17.0)
Immature Granulocytes: 7 %
Lymphocytes Relative: 17 %
Lymphs Abs: 3.6 10*3/uL (ref 0.7–4.0)
MCH: 30.3 pg (ref 26.0–34.0)
MCHC: 31.8 g/dL (ref 30.0–36.0)
MCV: 95.1 fL (ref 80.0–100.0)
Monocytes Absolute: 0.6 10*3/uL (ref 0.1–1.0)
Monocytes Relative: 3 %
Neutro Abs: 15.7 10*3/uL — ABNORMAL HIGH (ref 1.7–7.7)
Neutrophils Relative %: 72 %
Platelets: 289 10*3/uL (ref 150–400)
RBC: 3.47 MIL/uL — ABNORMAL LOW (ref 4.22–5.81)
RDW: 15.8 % — ABNORMAL HIGH (ref 11.5–15.5)
Smear Review: NORMAL
WBC: 21.4 10*3/uL — ABNORMAL HIGH (ref 4.0–10.5)
nRBC: 0 % (ref 0.0–0.2)

## 2021-02-10 LAB — BASIC METABOLIC PANEL
Anion gap: 2 — ABNORMAL LOW (ref 5–15)
BUN: 41 mg/dL — ABNORMAL HIGH (ref 8–23)
CO2: 41 mmol/L — ABNORMAL HIGH (ref 22–32)
Calcium: 8.6 mg/dL — ABNORMAL LOW (ref 8.9–10.3)
Chloride: 96 mmol/L — ABNORMAL LOW (ref 98–111)
Creatinine, Ser: 0.86 mg/dL (ref 0.61–1.24)
GFR, Estimated: >60 mL/min (ref >60)
Glucose, Bld: 116 mg/dL — ABNORMAL HIGH (ref 70–99)
Potassium: 4.8 mmol/L (ref 3.5–5.1)
Sodium: 139 mmol/L (ref 135–145)

## 2021-02-10 LAB — GLUCOSE, CAPILLARY
Glucose-Capillary: 110 mg/dL — ABNORMAL HIGH (ref 70–99)
Glucose-Capillary: 147 mg/dL — ABNORMAL HIGH (ref 70–99)
Glucose-Capillary: 149 mg/dL — ABNORMAL HIGH (ref 70–99)
Glucose-Capillary: 153 mg/dL — ABNORMAL HIGH (ref 70–99)
Glucose-Capillary: 169 mg/dL — ABNORMAL HIGH (ref 70–99)
Glucose-Capillary: 265 mg/dL — ABNORMAL HIGH (ref 70–99)

## 2021-02-10 LAB — MAGNESIUM: Magnesium: 2.1 mg/dL (ref 1.7–2.4)

## 2021-02-10 LAB — PHOSPHORUS: Phosphorus: 5.1 mg/dL — ABNORMAL HIGH (ref 2.5–4.6)

## 2021-02-10 LAB — PROCALCITONIN: Procalcitonin: <0.1 ng/mL

## 2021-02-10 MED ORDER — FUROSEMIDE 40 MG PO TABS
40.0000 mg | ORAL_TABLET | Freq: Two times a day (BID) | ORAL | Status: DC
  Administered 2021-02-10 – 2021-02-12 (×4): 40 mg via ORAL
  Filled 2021-02-10 (×4): qty 1

## 2021-02-10 MED ORDER — METHYLPREDNISOLONE SODIUM SUCC 40 MG IJ SOLR: 40.0000 mg | Freq: Two times a day (BID) | INTRAMUSCULAR | Status: DC

## 2021-02-10 MED ORDER — PREDNISONE 20 MG PO TABS
20.0000 mg | ORAL_TABLET | Freq: Every day | ORAL | Status: DC
  Administered 2021-02-11 – 2021-02-13 (×3): 20 mg via ORAL
  Filled 2021-02-10 (×3): qty 1

## 2021-02-10 NOTE — Progress Notes (Signed)
PROGRESS NOTE    Julian Sparks  MHD:[id number] DOB: 1945-09-02 DOA: 01/24/2021 PCP: [name], MD   Chief complaint.  Shortness of breath. Brief Narrative:  75 y.o. male with PMH as below presenting to the ED worsening shortness of breath from his baseline associated with bilateral lower extremity swelling as well as increased abdominal girth for about 2 days. Has generalized anasarca +hypoxia + chronic venous stasis with ulceration and was seen at the vascular clinic where he had on arrival placed bilaterally to both lower extremities. He was admitted to PCU with Severe ACUTE Hypoxic and Hypercapnic Respiratory Failure due to acute severe decompensated combined systloic and diastolic heart failure on BiPAP  5/31: Admitted for acute diastolic heart failure 6/1: Resp distress and mental status changes, placed on biPAP 6/2: Respiratory status improved, weaned off BiPAP to HFNC 50% FiO2 @ 60L 6/3: Overnight with agitation and unable to tolerate BiPAP with thick tan secretions, resulting in  hypoxia (O2 sats 60's) during coughing episode, transferred to [facility] ~ now weaned back to HFNC, add IV steroids for AECOPD 6/5: Tolerated BiPAP for 3 hours last night 6/6: Remains on HHFNC (currently at 87% FiO2),BMP concerning for Contraction Metabolic Alkalosis due to aggressive Diuresis with Lasix ~ will hold Lasix and give Diamox + Metolazone; Sputum culture with Streptococcus Agalactiae ~ will start Ceftriaxone 6/7: Placed on BiPAP this morning due to SOB, CXR with new RUL alveolar infiltrate (placed on Rocephin yesterday and sputum cx with Streptococcus Agalactiae. Will decrease Diamox to 500 mg daily, continue Metolazone 2.5 mg daily 6/8: On BiPAP overnight (50% FiO2) and tolerating, plan for HHFNC during day, diuresing well and contraction alkalosis continues to improve with Diamox + metolazone 6/9: Ceftriaxone switched to Cefazolin, remains on HHFNC (currently @ 65% FiO2), diuresing well, Creatinine  remains stable, foley catheter removed 6/10: CT chest concerning for R>L multifocal opacity (consistent with pnuemonia vs. PAH), along with progression of previous abdominal adenopathy (consistent with Lymphoma); Oncology consulted, Holding diuresis due to soft BP and slight increase in serum Creatinine, decrease steroids 02/06/21-patient remains on high flow nasal cannula with fragmented speech, I met with family at bedside including wife and daughter.  Patient still unable to lean back in supine position and we reviewed possibly needing cardiac cath in the future however he is very nervous about this and we explained that this will be done only when he is in chronic stable state.  He still has bilateral wheezing in all lung zones on exam today.  Has not had hemoptysis during my shift today. 02/07/21- patient still on HFNC, family at bedside. Reviewed CT with them and atelcetasis on rll will start Metaneb 02/08/21- patient is weaning down on FiO2 still on HFNC 02/09/21- patient weaned to bubbler 8L/min via [place].  Plan to optimize for TRH transfer.    Principal Problem:   Acute on chronic diastolic CHF (congestive heart failure) (HCC) Active Problems:   Type 2 diabetes mellitus without complication (HCC)   Acute on chronic respiratory failure (HCC)   Hyperkalemia   Obesity (BMI 30-39.9)   Acute on chronic congestive heart failure (HCC)   Acute cor pulmonale (HCC)   Lymphoma, small lymphocytic ([place])  #1.  Acute on chronic hypoxemic respiratory failure. Acute on chronic diastolic congestive heart failure. Essential hypertension. Patient was requiring BiPAP, condition had improved, he was able to wean down to 6 L oxygen, his baseline oxygen use was 2 L. Is seen by cardiology, currently still on 40 mg oral Lasix.  Condition  had improved.  Continue to wean oxygen.  Add incentive spirometer.  2.  Streptococcus Agalactiae pneumonia. Hemoptysis Lymphoma. Repeated procalcitonin level 0.1.  I reviewed  patient chest CT scan initially, had a multilobar pneumonia.  Condition had improved, he has completed course of antibiotics.  Will discontinue antibiotic this point. Patient amount of hemoptysis for the last week, he is currently not on any anticoagulation or antiplatelet treatment.  Hemoptysis could be from lymphoma. Will continue to follow.  3.  Leukocytosis. White cell count still very high, this could be from steroids.  Patient does not have significant bronchospasm, with went down steroids to prednisone 20 mg daily.  Check a CBC tomorrow.  #4.  Acute metabolic cephalopathy. Condition has improved.  #5.  Controlled type 2 diabetes with hyperglycemia. Increased glucose due to steroids, wean down steroids quickly.  DVT prophylaxis: SCDS Code Status: Partial Family Communication: Daughter at the bedside. Disposition Plan:    Status is: Inpatient  Remains inpatient appropriate because:Inpatient level of care appropriate due to severity of illness  Dispo: The patient is from: Home              Anticipated d/c is to: Home              Patient currently is not medically stable to d/c.   Difficult to place patient No        I/O last 3 completed shifts: In: 785.2 [P.O.:480; IV Piggyback:305.2] Out: 6761 [Urine:4050] Total I/O In: -  Out: 1400 [Urine:1400]     Consultants:  Pulm, card  Procedures: none  Antimicrobials: None Subjective: Patient still has cough with hemoptysis.  He coughed up small amount of blood.  He still has some short of breath with exertion, he is currently on 6 L oxygen, he was using 2 L oxygen at baseline. No fever or chills. No abdominal pain or nausea vomiting. No dysuria hematuria.   Objective: Vitals:   02/10/21 0957 02/10/21 1000 02/10/21 1100 02/10/21 1320  BP: 105/68   105/63  Pulse: 83 91 81 90  Resp:  19 (!) 22 (!) 25  Temp:      TempSrc:      SpO2:  90% 96% (!) 88%  Weight:        Intake/Output Summary (Last 24 hours) at  02/10/2021 1505 Last data filed at 02/10/2021 1442 Gross per 24 hour  Intake 155.17 ml  Output 2200 ml  Net -2044.83 ml   Filed Weights   02/01/21 0500 02/02/21 0442 02/03/21 0500  Weight: 95.5 kg 96 kg 93.1 kg    Examination:  General exam: Appears calm and comfortable  Respiratory system: Decreased breathing sounds. Respiratory effort normal. Cardiovascular system: S1 & S2 heard, RRR. No JVD, murmurs, rubs, gallops or clicks. No pedal edema. Gastrointestinal system: Abdomen is nondistended, soft and nontender. No organomegaly or masses felt. Normal bowel sounds heard. Central nervous system: Alert and oriented. No focal neurological deficits. Extremities: Symmetric 5 x 5 power. Skin: No rashes, lesions or ulcers Psychiatry: Mood & affect appropriate.     Data Reviewed: I have personally reviewed following labs and imaging studies  CBC: Recent Labs  Lab 02/06/21 0614 02/07/21 0353 02/08/21 0444 02/09/21 0602 02/10/21 0325  WBC 14.4* 15.3* 16.9* 22.1* 21.4*  NEUTROABS  --   --  11.7* 15.1* 15.7*  HGB 9.3* 9.5* 9.5* 10.7* 10.5*  HCT 30.1* 29.5* 30.2* 33.4* 33.0*  MCV 96.2 93.4 95.6 93.3 95.1  PLT 261 276 292 312 289   Basic  Metabolic Panel: Recent Labs  Lab 02/06/21 0614 02/07/21 0353 02/08/21 0444 02/09/21 0602 02/10/21 0325  NA 140 139 139 137 139  K 4.4 4.8 4.7 4.7 4.8  CL 98 97* 97* 93* 96*  CO2 37* 37* 39* 35* 41*  GLUCOSE 123* 138* 129* 141* 116*  BUN 54* 52* 49* 42* 41*  CREATININE 0.89 0.90 0.81 0.84 0.86  CALCIUM 8.6* 8.5* 8.6* 8.8* 8.6*  MG 2.3 2.3 2.3 2.3 2.1  PHOS 3.7 3.8 3.5 4.5 5.1*   GFR: Estimated Creatinine Clearance: 78 mL/min (by C-G formula based on SCr of 0.86 mg/dL). Liver Function Tests: No results for input(s): AST, ALT, ALKPHOS, BILITOT, PROT, ALBUMIN in the last 168 hours. No results for input(s): LIPASE, AMYLASE in the last 168 hours. No results for input(s): AMMONIA in the last 168 hours. Coagulation Profile: No results for  input(s): INR, PROTIME in the last 168 hours. Cardiac Enzymes: No results for input(s): CKTOTAL, CKMB, CKMBINDEX, TROPONINI in the last 168 hours. BNP (last 3 results) No results for input(s): PROBNP in the last 8760 hours. HbA1C: No results for input(s): HGBA1C in the last 72 hours. CBG: Recent Labs  Lab 02/09/21 1933 02/09/21 2359 02/10/21 0340 02/10/21 0731 02/10/21 1126  GLUCAP 227* 169* 110* 149* 265*   Lipid Profile: No results for input(s): CHOL, HDL, LDLCALC, TRIG, CHOLHDL, LDLDIRECT in the last 72 hours. Thyroid Function Tests: No results for input(s): TSH, T4TOTAL, FREET4, T3FREE, THYROIDAB in the last 72 hours. Anemia Panel: No results for input(s): VITAMINB12, FOLATE, FERRITIN, TIBC, IRON, RETICCTPCT in the last 72 hours. Sepsis Labs: Recent Labs  Lab 02/10/21 0325  PROCALCITON <0.10    Recent Results (from the past 240 hour(s))  Expectorated Sputum Assessment w Gram Stain, Rflx to Resp Cult     Status: None   Collection Time: 02/05/21 11:59 PM   Specimen: Sputum  Result Value Ref Range Status   Specimen Description SPUTUM  Final   Special Requests NONE  Final   Sputum evaluation   Final    THIS SPECIMEN IS ACCEPTABLE FOR SPUTUM CULTURE Performed at [facility], [address], [place], [place] [address]    Report Status 02/06/2021 FINAL  Final  Culture, Respiratory w Gram Stain     Status: None   Collection Time: 02/05/21 11:59 PM   Specimen: SPU  Result Value Ref Range Status   Specimen Description   Final    SPUTUM Performed at [facility], [address] [place], [place] [address]    Special Requests   Final    NONE Reflexed from [id number] Performed at [facility], [address] [place], [place] [address]    Gram Stain   Final    NO WBC SEEN RARE BUDDING YEAST SEEN Performed at [facility], [place] [address], [place], [place] [address]    Culture   Final    ABUNDANT STENOTROPHOMONAS MALTOPHILIA RARE  CANDIDA ALBICANS    Report Status 02/08/2021 FINAL  Final   Organism ID, Bacteria STENOTROPHOMONAS MALTOPHILIA  Final      Susceptibility   Stenotrophomonas maltophilia - MIC*    LEVOFLOXACIN 2 SENSITIVE Sensitive     TRIMETH/SULFA <=20 SENSITIVE Sensitive     * ABUNDANT STENOTROPHOMONAS MALTOPHILIA         Radiology Studies: DG Chest Port 1 View  Result Date: 02/10/2021 CLINICAL DATA:  Shortness of breath EXAM: PORTABLE CHEST 1 VIEW COMPARISON:  February 06, 2021 chest radiograph and chest CT February 03, 2021 FINDINGS: There is underlying fibrotic change, more notable on the right than on the left. There has been partial but incomplete clearing of underlying airspace opacity on the right, particularly in the right mid lung. There remains patchy airspace opacity superimposed on fibrosis on the right. There is stable left midlung atelectasis. There is also left base atelectasis. No new opacities evident. Heart is mildly enlarged with pulmonary vascular normal. No adenopathy. There is aortic atherosclerosis. No bone lesions. IMPRESSION: Partial but incomplete clearing of airspace opacity/infiltrate on the right. Areas of atelectatic change on the left are stable. Underlying fibrotic changes, more severe on the right left, stable. Stable cardiac enlargement. Aortic Atherosclerosis (ICD10-I70.0). Electronically Signed   By: Lowella Grip III M.D.   On: 02/10/2021 11:05        Scheduled Meds:  arformoterol  15 mcg Nebulization BID   vitamin C  500 mg Oral Daily   atorvastatin  40 mg Oral Daily   budesonide (PULMICORT) nebulizer solution  0.5 mg Nebulization BID   Chlorhexidine Gluconate Cloth  6 each Topical Q0600   fluticasone  1 spray Each Nare BID   furosemide  40 mg Oral BID   gabapentin  300 mg Oral TID   insulin aspart  0-20 Units Subcutaneous Q4H   insulin aspart  7 Units Subcutaneous TID AC   insulin glargine  20 Units Subcutaneous Daily   melatonin  5 mg Oral QHS   metoprolol  succinate  12.5 mg Oral Daily   [START ON 02/11/2021] predniSONE  20 mg Oral Q breakfast   revefenacin  175 mcg Nebulization Daily   sodium chloride flush  3 mL Intravenous Q12H   traZODone  100 mg Oral QHS   Continuous Infusions:  sodium chloride       LOS: 17 days    Time spent: 32 minutes    Sharen Hones, MD [facility]   To contact the attending provider between 7A-7P or the covering provider during after hours 7P-7A, please log into the web site [email or web address] and access using universal [place] password for that web site. If you do not have the password, please call the hospital operator.  02/10/2021, 3:05 PM

## 2021-02-10 NOTE — Progress Notes (Signed)
Progress Note  Patient Name: Julian Sparks Date of Encounter: 02/10/2021  Primary Cardiologist: Ida Rogue, MD  Subjective   Sitting at bedside this AM.  Feels like he's improving.  No chest pain.  Remains on high flow O2 -11 lpm currently.  Coughing up blood-tinged sputum w/ clots.  Inpatient Medications    Scheduled Meds:  arformoterol  15 mcg Nebulization BID   vitamin C  500 mg Oral Daily   atorvastatin  40 mg Oral Daily   budesonide (PULMICORT) nebulizer solution  0.5 mg Nebulization BID   Chlorhexidine Gluconate Cloth  6 each Topical Q0600   fluticasone  1 spray Each Nare BID   furosemide  40 mg Oral Daily   gabapentin  300 mg Oral TID   insulin aspart  0-20 Units Subcutaneous Q4H   insulin aspart  7 Units Subcutaneous TID AC   insulin glargine  20 Units Subcutaneous Daily   melatonin  5 mg Oral QHS   methylPREDNISolone (SOLU-MEDROL) injection  40 mg Intravenous Q12H   metoprolol succinate  12.5 mg Oral Daily   revefenacin  175 mcg Nebulization Daily   sodium chloride flush  3 mL Intravenous Q12H   traZODone  100 mg Oral QHS   Continuous Infusions:  sodium chloride     levofloxacin (LEVAQUIN) IV Stopped (02/09/21 1803)   PRN Meds: sodium chloride, acetaminophen, albuterol, clonazepam, cyclobenzaprine, morphine injection, ondansetron (ZOFRAN) IV, sodium chloride flush   Vital Signs    Vitals:   02/10/21 0800 02/10/21 0900 02/10/21 0957 02/10/21 1000  BP: 105/68  105/68   Pulse: 73 82 83 91  Resp: 19 14  19   Temp:      TempSrc:      SpO2: 97% 99%  90%  Weight:        Intake/Output Summary (Last 24 hours) at 02/10/2021 1056 Last data filed at 02/10/2021 0943 Gross per 24 hour  Intake 275.17 ml  Output 2500 ml  Net -2224.83 ml   Filed Weights   02/01/21 0500 02/02/21 0442 02/03/21 0500  Weight: 95.5 kg 96 kg 93.1 kg    Physical Exam   GEN: Well nourished, well developed, in no acute distress.  HEENT: Grossly normal.  Neck: Supple, difficult  to gauge JVP due to body habitus/positioning.  No carotid bruits, or masses. Cardiac: RRR, no murmurs, rubs, or gallops. No clubbing, cyanosis, edema.  Radials 2+, DP/PT 1+ and equal bilaterally.  Respiratory:  Respirations regular and unlabored, coarse breath sounds bilaterally. GI: Soft, nontender, nondistended, BS + x 4. MS: no deformity or atrophy. Skin: warm and dry, no rash. Neuro:  Strength and sensation are intact. Psych: AAOx3.  Normal affect.  Labs    Chemistry Recent Labs  Lab 02/08/21 0444 02/09/21 0602 02/10/21 0325  NA 139 137 139  K 4.7 4.7 4.8  CL 97* 93* 96*  CO2 39* 35* 41*  GLUCOSE 129* 141* 116*  BUN 49* 42* 41*  CREATININE 0.81 0.84 0.86  CALCIUM 8.6* 8.8* 8.6*  GFRNONAA >60 >60 >60  ANIONGAP 3* 9 2*     Hematology Recent Labs  Lab 02/08/21 0444 02/09/21 0602 02/10/21 0325  WBC 16.9* 22.1* 21.4*  RBC 3.16* 3.58* 3.47*  HGB 9.5* 10.7* 10.5*  HCT 30.2* 33.4* 33.0*  MCV 95.6 93.3 95.1  MCH 30.1 29.9 30.3  MCHC 31.5 32.0 31.8  RDW 15.0 15.4 15.8*  PLT 292 312 289    Cardiac Enzymes  Recent Labs  Lab 01/24/21 1132  TROPONINIHS 13  Lipids  Lab Results  Component Value Date   CHOL 101 01/26/2021   HDL 46 01/26/2021   LDLCALC 36 01/26/2021   TRIG 97 01/26/2021   CHOLHDL 2.2 01/26/2021    HbA1c  Lab Results  Component Value Date   HGBA1C 6.8 (H) 01/24/2021    Radiology    No results found.  Telemetry    Sinus rhythm w/ PACs/PVCs; intermittent sinus brady occas into 40's; brief runs of accelerated idioventricular rhythm vs jxnl w/ intermittent bbb - Personally Reviewed  Cardiac Studies   2D Echocardiogram 5.31.2022   1. Left ventricular ejection fraction, by estimation, is 50 to 55%. The  left ventricle has low normal function. Hypokinesis of septal wall. Left  ventricular diastolic parameters are consistent with Grade II diastolic  dysfunction (pseudonormalization).   2. Right ventricular systolic function is  moderately reduced. The right  ventricular size is severely enlarged. Tricuspid regurgitation signal is  inadequate for assessing PA pressure.   3. Right atrial size was mildly dilated.   Patient Profile     75 y.o. male with history of HFpEF, COPD, hypoxic respiratory failure presenting with shortness of breath, diagnosed with hypoxia and hypercapnic respiratory failure.  Being seen for HFpEF.  Assessment & Plan    Acute on chronic hypoxic and hypercapnic resp failure: multifactorial in the setting of AECOPD, S agalactiae PNA, post-COVID pneumonitis/fibrosis, and HFpEF.  Remains on high flow nasal cannula.  Coughing up blood tinged sputum w/ clots.  Steroids/nebs/abx per IM.  2.  Acute on chronic HFpEF/PAH:  EF 50-55% by echo this admission w/ gr2 dd.  Septal HK and severe RV enlargement also noted.  Volume status looks good.  Currently receiving lasix 40mg  PO daily.  Minus 35L since admission, though it appears intakes have not always been accurately recorded.  Last weight 6/10 was 93.1kg, which is the lowest he's been in several years.  HR/BP stable on low dose ? blocker. Renal fxn stable.  Can consider outpt RHC in future.  3.  HL:  LDL 36 on statin rx.  4.  DMII:  A1c 6.8.  Insulin mgmt per IM.  5.  Lymphoma/Abdominal adenopathy:  seen by oncology w/ plan for outpt treatment.  Signed, Murray Hodgkins, NP  02/10/2021, 10:56 AM    For questions or updates, please contact   Please consult [email or web address] for contact info under Cardiology/STEMI.

## 2021-02-10 NOTE — Progress Notes (Signed)
NAME:  Julian Sparks, MRN:  [id number], DOB:  1946-04-27, LOS: 17 ADMISSION DATE:  01/24/2021, INITIAL CONSULTATION DATE: 01/27/2021 REFERRING MD: Max Sane MD, CHIEF COMPLAINT: Worsening SOB  Brief Patient Description  75 y.o. male with PMH as below presenting to the ED worsening shortness of breath from his baseline associated with bilateral lower extremity swelling as well as increased abdominal girth for about 2 days. Has generalized anasarca +hypoxia + chronic venous stasis with ulceration and was seen at the vascular clinic where he had on arrival placed bilaterally to both lower extremities. He was admitted to PCU with Severe ACUTE Hypoxic and Hypercapnic Respiratory Failure due to acute severe decompensated combined systloic and diastolic heart failure on BiPAP  Pertinent  Medical History    Chronic bronchitis (HCC)   COPD (chronic obstructive pulmonary disease) ([place])   Diabetes mellitus without complication ([place])   Diastolic dysfunction   Dyspnea   Chronic venous statis   Hypertension   Significant Hospital Events: Including procedures, antibiotic start and stop dates in addition to other pertinent events   5/31: Admitted for acute diastolic heart failure 6/1: Resp distress and mental status changes, placed on biPAP 6/2: Respiratory status improved, weaned off BiPAP to HFNC 50% FiO2 @ 60L 6/3: Overnight with agitation and unable to tolerate BiPAP with thick tan secretions, resulting in  hypoxia (O2 sats 60's) during coughing episode, transferred to [facility] ~ now weaned back to HFNC, add IV steroids for AECOPD 6/5: Tolerated BiPAP for 3 hours last night 6/6: Remains on HHFNC (currently at 87% FiO2),BMP concerning for Contraction Metabolic Alkalosis due to aggressive Diuresis with Lasix ~ will hold Lasix and give Diamox + Metolazone; Sputum culture with Streptococcus Agalactiae ~ will start Ceftriaxone  6/7: Placed on BiPAP this morning due to SOB, CXR with new RUL alveolar  infiltrate (placed on Rocephin yesterday and sputum cx with Streptococcus Agalactiae. Will decrease Diamox to 500 mg daily, continue Metolazone 2.5 mg daily 6/8: On BiPAP overnight (50% FiO2) and tolerating, plan for HHFNC during day, diuresing well and contraction alkalosis continues to improve with Diamox + metolazone 6/9: Ceftriaxone switched to Cefazolin, remains on HHFNC (currently @ 65% FiO2), diuresing well, Creatinine remains stable, foley catheter removed 6/10: CT chest concerning for R>L multifocal opacity (consistent with pnuemonia vs. PAH), along with progression of previous abdominal adenopathy (consistent with Lymphoma); Oncology consulted, Holding diuresis due to soft BP and slight increase in serum Creatinine, decrease steroids 02/06/21-patient remains on high flow nasal cannula with fragmented speech, I met with family at bedside including wife and daughter.  Patient still unable to lean back in supine position and we reviewed possibly needing cardiac cath in the future however he is very nervous about this and we explained that this will be done only when he is in chronic stable state.  He still has bilateral wheezing in all lung zones on exam today.  Has not had hemoptysis during my shift today. 02/07/21- patient still on HFNC, family at bedside. Reviewed CT with them and atelcetasis on rll will start Metaneb 02/08/21- patient is weaning down on FiO2 still on HFNC 02/09/21- patient weaned to bubbler 8L/min via [place].  Plan to optimize for TRH transfer.   Cultures:  01/24/2021: SARS-CoV-2 PCR>> negative 01/24/2021: Influenza PCR>> negative 01/27/2021: MRSA PCR>> negative 01/27/2021: Sputum>> STREPTOCOCCUS AGALACTIAE  Antimicrobials:  Ceftriaxone 6/6>>6/9 Cefazolin 6/9>>        OBJECTIVE   Blood pressure 105/68, pulse 91, temperature 98.5 F (36.9 C), temperature source Oral, resp.  rate 19, weight 93.1 kg, SpO2 (!) 83 %.        Intake/Output Summary (Last 24 hours) at 02/10/2021  1026 Last data filed at 02/10/2021 0943 Gross per 24 hour  Intake 275.17 ml  Output 2500 ml  Net -2224.83 ml    Filed Weights   02/01/21 0500 02/02/21 0442 02/03/21 0500  Weight: 95.5 kg 96 kg 93.1 kg    Physical Examination: GENERAL: Acute on chronically ill-appearing male, sitting up in chair , on bubbler high flow HEAD: Normocephalic, atraumatic.  EYES: Pupils equal, round, reactive to light.  No scleral icterus.  MOUTH: Moist mucosal membrane. NECK: Supple, positive JVD PULMONARY: Coarse breath sounds bilaterally, wheezing today,no rales, even, no accessory muscle use CARDIOVASCULAR: S1 and S2. Regular rate and rhythm. No murmurs, rubs, or gallops.  GASTROINTESTINAL: Soft, nontender, -distended. Positive bowel sounds.  MUSCULOSKELETAL: Trace edema bilateral lower extremity, no deformities, normal bulk and tone  NEUROLOGIC: Awake, alert and oriented x3, moves all extremities to command, no focal deficits, speech clear, pupils PERRLA SKIN: Limited exam- intact,warm,dry   Review of Systems:  A 10 point review of systems was performed and it is as noted above otherwise negative.   CXR 6/10>>There is extensive airspace opacity in the right upper and mid lung regions, slightly increased. There is patchy airspace opacity in the left mid lung, stable, and in the left base, marginally increased. There is stable cardiomegaly with a degree of pulmonary venous hypertension. There is aortic atherosclerosis. No adenopathy no bone lesions. CT Chest w/o contrast 6/10>>1. Right greater than left multifocal airspace and ground-glass opacity, most consistent with pneumonia. 2. Tiny bilateral pleural effusions. 3. Progression of massive upper abdominal adenopathy,consistent with lymphoma. 4. Development of mild thoracic adenopathy which could be reactive or related to thoracic extension of lymphoma. 5. Aortic atherosclerosis (ICD10-I70.0), coronary artery atherosclerosis and emphysema  (ICD10-J43.9). 6. Pulmonary artery enlargement suggests pulmonary arterial hypertension. 7. Interval T7 compression deformity.   Labs   CBC: Recent Labs  Lab 02/06/21 0614 02/07/21 0353 02/08/21 0444 02/09/21 0602 02/10/21 0325  WBC 14.4* 15.3* 16.9* 22.1* 21.4*  NEUTROABS  --   --  11.7* 15.1* 15.7*  HGB 9.3* 9.5* 9.5* 10.7* 10.5*  HCT 30.1* 29.5* 30.2* 33.4* 33.0*  MCV 96.2 93.4 95.6 93.3 95.1  PLT 261 276 292 312 289     Basic Metabolic Panel: Recent Labs  Lab 02/06/21 0614 02/07/21 0353 02/08/21 0444 02/09/21 0602 02/10/21 0325  NA 140 139 139 137 139  K 4.4 4.8 4.7 4.7 4.8  CL 98 97* 97* 93* 96*  CO2 37* 37* 39* 35* 41*  GLUCOSE 123* 138* 129* 141* 116*  BUN 54* 52* 49* 42* 41*  CREATININE 0.89 0.90 0.81 0.84 0.86  CALCIUM 8.6* 8.5* 8.6* 8.8* 8.6*  MG 2.3 2.3 2.3 2.3 2.1  PHOS 3.7 3.8 3.5 4.5 5.1*    GFR: Estimated Creatinine Clearance: 78 mL/min (by C-G formula based on SCr of 0.86 mg/dL). Recent Labs  Lab 02/07/21 0353 02/08/21 0444 02/09/21 0602 02/10/21 0325  PROCALCITON  --   --   --  <0.10  WBC 15.3* 16.9* 22.1* 21.4*     Liver Function Tests: No results for input(s): AST, ALT, ALKPHOS, BILITOT, PROT, ALBUMIN in the last 168 hours. No results for input(s): LIPASE, AMYLASE in the last 168 hours. No results for input(s): AMMONIA in the last 168 hours.  ABG    Component Value Date/Time   PHART 7.34 (L) 01/27/2021 0115   PCO2ART 77 (  HH) 01/27/2021 0115   PO2ART 72 (L) 01/27/2021 0115   HCO3 41.5 (H) 01/27/2021 0115   O2SAT 93.3 01/27/2021 0115     Coagulation Profile: No results for input(s): INR, PROTIME in the last 168 hours.   Cardiac Enzymes: No results for input(s): CKTOTAL, CKMB, CKMBINDEX, TROPONINI in the last 168 hours.  HbA1C: Hgb A1c MFr Bld  Date/Time Value Ref Range Status  01/24/2021 11:32 AM 6.8 (H) 4.8 - 5.6 % Final    Comment:    (NOTE)         Prediabetes: 5.7 - 6.4         Diabetes: >6.4         Glycemic  control for adults with diabetes: <7.0   02/05/2019 02:07 AM 7.3 (H) 4.8 - 5.6 % Final    Comment:    (NOTE) Pre diabetes:          5.7%-6.4% Diabetes:              >6.4% Glycemic control for   <7.0% adults with diabetes     CBG: Recent Labs  Lab 02/09/21 1536 02/09/21 1933 02/09/21 2359 02/10/21 0340 02/10/21 0731  GLUCAP 125* 227* 169* 110* 149*     Allergies Allergies  Allergen Reactions   Aspirin Nausea Only and Other (See Comments)    325 mg aspirin cause stomach upset   Penicillins Rash    Scheduled Meds:  arformoterol  15 mcg Nebulization BID   vitamin C  500 mg Oral Daily   atorvastatin  40 mg Oral Daily   budesonide (PULMICORT) nebulizer solution  0.5 mg Nebulization BID   Chlorhexidine Gluconate Cloth  6 each Topical Q0600   fluticasone  1 spray Each Nare BID   furosemide  40 mg Oral Daily   gabapentin  300 mg Oral TID   insulin aspart  0-20 Units Subcutaneous Q4H   insulin aspart  7 Units Subcutaneous TID AC   insulin glargine  20 Units Subcutaneous Daily   melatonin  5 mg Oral QHS   methylPREDNISolone (SOLU-MEDROL) injection  40 mg Intravenous Q12H   metoprolol succinate  12.5 mg Oral Daily   revefenacin  175 mcg Nebulization Daily   sodium chloride flush  3 mL Intravenous Q12H   traZODone  100 mg Oral QHS   Continuous Infusions:  sodium chloride     levofloxacin (LEVAQUIN) IV Stopped (02/09/21 1803)   PRN Meds:.sodium chloride, acetaminophen, albuterol, clonazepam, cyclobenzaprine, morphine injection, ondansetron (ZOFRAN) IV, sodium chloride flush   Resolved Hospital Problem list     ASSESSMENT & PLAN   Acute Hypoxic Hypercapnic Respiratory Failure secondary to Acute Decompensated HFpEF, AECOPD, STREPTOCOCCUS AGALACTIAE  -Supplemental O2 as needed to maintain O2 saturations 88 to 92% -BiPAP as needed -Follow intermittent ABG and chest x-ray as needed -Diuresis as BP and renal function permits ~Lasix today -IV Steroids, decreased  SoluMedrol to 63m q 12h  -ESR, CRP, RF,nonrevealing thus far, ANCA pendidng -Brovana BID, Yupelri q d, PRN albuterol, Budesonide BID -Sputum culture from 01/27/21 with STREPTOCOCCUS AGALACTIAE ~ -Continue Cefazolin     Acute on chronic Diastolic HFpEF  Hypertension Hx: HLD  -Continuous cardiac monitoring -Maintain MAP greater than 65 -Diuresis as BP and renal function permits ~ hold diuresis today -Hold HCTZ, metoprolol. -Continue low dose Losartan -Atorvastatin 40 mg -Repeat 2D Echocardiogram shows Left ventricular ejection fraction, by estimation, is 50 to 55%.  Hypokinesis of septal wall. Left ventricular diastolic parameters are consistent with Grade II diastolic dysfunction (pseudonormalization).The right  ventricular size is severely enlarged. -Cardiology following, appreciate input    STREPTOCOCCUS AGALACTIAE Pneumonia -Monitor fever curve -Trend WBC's -Sputum culture on 6/3 with Strep Agalactiae -Continue Cefazolin add Vanco    Contraction Metabolic Alkalosis, suspect due to IV Lasix~ improving -Monitor I&O's / urinary output -Follow BMP -Ensure adequate renal perfusion -Avoid nephrotoxic agents as able -Replace electrolytes as indicated   Abdominal Adenopathy: Known lymphoma -Oncology consulted, appreciate input -CT Abdomen/Pelvis with contrast shows increased lymphadenopathy compared to 2020  Anemia Chronic disease+acute blood loss  Monitor No anticoagulants SCDs    Acute Metabolic Encephalopathy due to Hypercapnia now improved -off Preceddex -Provide supportive care -BiPAP @ hs   Diabetes mellitus -CBGs -Sliding scale insulin & 10 units Lantus daily -Follow ICU hyper/hypoglycemia protocol -Hold home Glipizide     Best practice (right click and "Reselect all SmartList Selections" daily)  Diet:  Oral Pain/Anxiety/Delirium protocol (if indicated): No VAP protocol (if indicated): Not indicated DVT prophylaxis: Contraindicated due to hemoptysis   GI prophylaxis: PPI Glucose control:  SSI Yes Central venous access:  N/A Arterial line:  N/A Foley:  N/A Mobility:  OOB as tolerated  PT consulted: Yes Last date of multidisciplinary goals of care discussion [02/03/21] Code Status:  Limited Code (NO CPR) Disposition: Stepdown   Critical care provider statement:    Critical care time (minutes):  33   Critical care time was exclusive of:  Separately billable procedures and  treating other patients   Critical care was necessary to treat or prevent imminent or  life-threatening deterioration of the following conditions: Acute hypoxemic respiratory failure, sepsis, pneumonia, multiple comorbid conditions   Critical care was time spent personally by me on the following  activities:  Development of treatment plan with patient or surrogate,  discussions with consultants, evaluation of patient's response to  treatment, examination of patient, obtaining history from patient or  surrogate, ordering and performing treatments and interventions, ordering  and review of laboratory studies and re-evaluation of patient's condition   I assumed direction of critical care for this patient from another  provider in my specialty: no      *This note was dictated using voice recognition software/Dragon.  Despite best efforts to proofread, errors can occur which can change the meaning.  Any change was purely unintentional.      Ottie Glazier, M.D.  [facility]

## 2021-02-10 NOTE — Progress Notes (Signed)
Physical Therapy Treatment Patient Details Name: Julian Sparks MRN: [id number] DOB: 28-Mar-1946 Today's Date: 02/10/2021    History of Present Illness presented to ER secondary to progressive SOB, LE and abdominal edema; admitted for management of acute/chronic hypoxic, hypercapnic respiratory failure due to CHF/COPD.    PT Comments    Pt seen for PT tx with pt very eager to participate. Pt reports he'd like to walk more frequently than every other day & wishes to go home vs rehab. Educated pt on continued recommendation of STR f/u upon d/c. Pt on 8L/min HFNC increased to 10L/min with mobility with pt able to complete transfers & gait with CGA with 2nd person to manage lines & follow with chair. Educated pt on pursed lip breathing & decreased gait speed to focus on endurance with activity. SpO2 80-91% during session with pt able to recover with pursed lip breathing & rest; one instance of SpO2 reading 69% but pt not in observable distress & pleth waveform not consistent but pt instructed to sit when PT saw this SpO2 reading (nurse in room to observe & assist). Continue to recommend TR upon d/c to maximize independence with functional mobility & reduce fall risk prior to return home.     Follow Up Recommendations  SNF     Equipment Recommendations  None recommended by PT    Recommendations for Other Services       Precautions / Restrictions Precautions Precautions: Fall Restrictions Weight Bearing Restrictions: No    Mobility  Bed Mobility                    Transfers Overall transfer level: Needs assistance Equipment used: Rolling walker (2 wheeled) Transfers: Sit to/from Stand Sit to Stand: Min guard         General transfer comment: cuing for safe hand placement to push to standing  Ambulation/Gait Ambulation/Gait assistance: Min guard Gait Distance (Feet): 25 Feet (+ 10 ft) Assistive device: Rolling walker (2 wheeled) Gait Pattern/deviations: Decreased step  length - left;Decreased step length - right;Decreased stride length Gait velocity: cued to ambulate at decreased gait speed to focus on breathing & endurance       Stairs             Wheelchair Mobility    Modified Rankin (Stroke Patients Only)       Balance Overall balance assessment: Needs assistance Sitting-balance support: No upper extremity supported;Feet supported Sitting balance-Leahy Scale: Good     Standing balance support: Bilateral upper extremity supported;During functional activity Standing balance-Leahy Scale: Fair                              Cognition Arousal/Alertness: Awake/alert Behavior During Therapy: WFL for tasks assessed/performed Overall Cognitive Status: Within Functional Limits for tasks assessed                                 General Comments: Very motivated to participate, eager to walk more frequently      Exercises General Exercises - Lower Extremity Hip ABduction/ADduction: AROM;Strengthening;Both;10 reps;Seated (hip adduction pillow squeezes) Hip Flexion/Marching: AROM;Strengthening;Both;20 reps;Seated    General Comments        Pertinent Vitals/Pain Pain Assessment: No/denies pain    Home Living                      Prior Function  PT Goals (current goals can now be found in the care plan section) Acute Rehab PT Goals Patient Stated Goal: to get stronger and get back home! does not want to go to rehab PT Goal Formulation: With patient Time For Goal Achievement: 02/15/21 Potential to Achieve Goals: Good Progress towards PT goals: Progressing toward goals    Frequency    Min 2X/week      PT Plan Current plan remains appropriate    Co-evaluation              AM-PAC PT "6 Clicks" Mobility   Outcome Measure  Help needed turning from your back to your side while in a flat bed without using bedrails?: None Help needed moving from lying on your back to  sitting on the side of a flat bed without using bedrails?: A Little Help needed moving to and from a bed to a chair (including a wheelchair)?: A Little Help needed standing up from a chair using your arms (e.g., wheelchair or bedside chair)?: A Little Help needed to walk in hospital room?: A Little Help needed climbing 3-5 steps with a railing? : A Lot 6 Click Score: 18    End of Session Equipment Utilized During Treatment: Oxygen;Gait belt Activity Tolerance: Patient tolerated treatment well Patient left: in chair;with call bell/phone within reach;with nursing/sitter in room Nurse Communication:  (O2) PT Visit Diagnosis: Difficulty in walking, not elsewhere classified (R26.2);Muscle weakness (generalized) (M62.81)     Time: 3151-7616 PT Time Calculation (min) (ACUTE ONLY): 23 min  Charges:  $Therapeutic Activity: 23-37 mins                     Lavone Nian, PT, DPT 02/10/21, 12:24 PM    Waunita Schooner 02/10/2021, 12:21 PM

## 2021-02-10 NOTE — Consult Note (Addendum)
   Heart Failure Nurse Navigator Note  HFpEF   He presented to the with complaints of worsening shortness of breath, increasing his and bilateral lower extremity swelling.    Comorbidities:  COPD chronic 02 use of 2 liter Diabetes Hypertension  Labs:  Sodium 139, potassium 4.8, chloride, CO2 41, BUN 41, creatinine 0.86, magnesium 2.1, phosphorus 5.1, procalcitonin  <0.10 Weight is 93.1 kg  blood pressure 105/60  Medications:  Atorvastatin 40 mg daily Furosemide 40 mg daily Metoprolol succinate 12 and half milligrams daily   Assessment:  General-he is awake and alert sitting up in the chair at bedside in no acute distress.  HEENT-wears glasses, pupils equal, unable to evaluate for JVD.  Cardiac-heart tones are regular rate and rhythm no murmurs   Chest-to posterior auscultation faint inspiratory wheezes throughout the lung fields.  Abdomen-rounded, nontender  Musculoskeletal-no lower extremity edema noted  Psych-is pleasant and appropriate, makes good eye contact.  Neurologic-speech is clear, moves all extremities,   Met with patient in the ICU.  He states that he is familiar with heart failure as his wife had it.  Discussed how he can take care of himself at home.  He is already thinking about adjustments that he has to make in his diet as far as for instance not eating biscuits, bacon and ham for breakfast.  He states that his 2 daughters and granddaughter are very involved in his care. Daughter has ordered seasoning to replace the salt.   Went over his zone  magnet and what to report to physician.  He states that he has a scale and will weigh daily,record.  Also talked about fluid restriction of 64 ounces daily.   Discussed heart failure clinic appointment as an outpatient.  He states that he is familiar with Darylene Price and is looking forward to his appointment with her.  He was given a living with heart failure teaching booklet along with and taking  charge of your heart failure".  Is hoping to be going home soon but I told him if he was here still on Monday that I would visit  and answer any questions at that time.    Pricilla Riffle RN [facility]

## 2021-02-10 NOTE — Plan of Care (Signed)
  Problem: Health Behavior/Discharge Planning: Goal: Ability to manage health-related needs will improve Outcome: Progressing   Problem: Pain Managment: Goal: General experience of comfort will improve Outcome: Progressing   

## 2021-02-11 DIAGNOSIS — G9341 Metabolic encephalopathy: Secondary | ICD-10-CM | POA: Diagnosis not present

## 2021-02-11 DIAGNOSIS — I5033 Acute on chronic diastolic (congestive) heart failure: Secondary | ICD-10-CM | POA: Diagnosis not present

## 2021-02-11 DIAGNOSIS — J9621 Acute and chronic respiratory failure with hypoxia: Secondary | ICD-10-CM | POA: Diagnosis not present

## 2021-02-11 DIAGNOSIS — I2609 Other pulmonary embolism with acute cor pulmonale: Secondary | ICD-10-CM | POA: Diagnosis not present

## 2021-02-11 LAB — BASIC METABOLIC PANEL
Anion gap: 4 — ABNORMAL LOW (ref 5–15)
BUN: 49 mg/dL — ABNORMAL HIGH (ref 8–23)
CO2: 39 mmol/L — ABNORMAL HIGH (ref 22–32)
Calcium: 8.4 mg/dL — ABNORMAL LOW (ref 8.9–10.3)
Chloride: 92 mmol/L — ABNORMAL LOW (ref 98–111)
Creatinine, Ser: 0.93 mg/dL (ref 0.61–1.24)
GFR, Estimated: >60 mL/min (ref >60)
Glucose, Bld: 93 mg/dL (ref 70–99)
Potassium: 4 mmol/L (ref 3.5–5.1)
Sodium: 135 mmol/L (ref 135–145)

## 2021-02-11 LAB — GLUCOSE, CAPILLARY
Glucose-Capillary: 104 mg/dL — ABNORMAL HIGH (ref 70–99)
Glucose-Capillary: 117 mg/dL — ABNORMAL HIGH (ref 70–99)
Glucose-Capillary: 150 mg/dL — ABNORMAL HIGH (ref 70–99)
Glucose-Capillary: 182 mg/dL — ABNORMAL HIGH (ref 70–99)
Glucose-Capillary: 292 mg/dL — ABNORMAL HIGH (ref 70–99)
Glucose-Capillary: 50 mg/dL — ABNORMAL LOW (ref 70–99)
Glucose-Capillary: 76 mg/dL (ref 70–99)

## 2021-02-11 LAB — MAGNESIUM: Magnesium: 2.2 mg/dL (ref 1.7–2.4)

## 2021-02-11 LAB — CBC WITH DIFFERENTIAL/PLATELET
Abs Immature Granulocytes: 1.01 10*3/uL — ABNORMAL HIGH (ref 0.00–0.07)
Basophils Absolute: 0.1 10*3/uL (ref 0.0–0.1)
Basophils Relative: 0 %
Eosinophils Absolute: 0.2 10*3/uL (ref 0.0–0.5)
Eosinophils Relative: 1 %
HCT: 34.2 % — ABNORMAL LOW (ref 39.0–52.0)
Hemoglobin: 10.8 g/dL — ABNORMAL LOW (ref 13.0–17.0)
Immature Granulocytes: 5 %
Lymphocytes Relative: 22 %
Lymphs Abs: 4.9 10*3/uL — ABNORMAL HIGH (ref 0.7–4.0)
MCH: 29.9 pg (ref 26.0–34.0)
MCHC: 31.6 g/dL (ref 30.0–36.0)
MCV: 94.7 fL (ref 80.0–100.0)
Monocytes Absolute: 1.2 10*3/uL — ABNORMAL HIGH (ref 0.1–1.0)
Monocytes Relative: 6 %
Neutro Abs: 14.7 10*3/uL — ABNORMAL HIGH (ref 1.7–7.7)
Neutrophils Relative %: 66 %
Platelets: 285 10*3/uL (ref 150–400)
RBC: 3.61 MIL/uL — ABNORMAL LOW (ref 4.22–5.81)
RDW: 16.1 % — ABNORMAL HIGH (ref 11.5–15.5)
WBC: 22.1 10*3/uL — ABNORMAL HIGH (ref 4.0–10.5)
nRBC: 0 % (ref 0.0–0.2)

## 2021-02-11 LAB — PHOSPHORUS: Phosphorus: 4.2 mg/dL (ref 2.5–4.6)

## 2021-02-11 MED ORDER — GUAIFENESIN 100 MG/5ML PO SOLN
5.0000 mL | ORAL | Status: DC | PRN
  Administered 2021-02-11 – 2021-02-13 (×5): 100 mg via ORAL
  Filled 2021-02-11 (×6): qty 5

## 2021-02-11 NOTE — Progress Notes (Signed)
PROGRESS NOTE    Julian Sparks  AUQ:[id number] DOB: 11/09/1945 DOA: 01/24/2021 PCP: [name], MD   Chief complaint.  Shortness of breath. Brief Narrative:  75 y.o. male with PMH as below presenting to the ED worsening shortness of breath from his baseline associated with bilateral lower extremity swelling as well as increased abdominal girth for about 2 days. Has generalized anasarca +hypoxia + chronic venous stasis with ulceration and was seen at the vascular clinic where he had on arrival placed bilaterally to both lower extremities. He was admitted to PCU with Severe ACUTE Hypoxic and Hypercapnic Respiratory Failure due to acute severe decompensated combined systloic and diastolic heart failure on BiPAP   5/31: Admitted for acute diastolic heart failure 6/1: Resp distress and mental status changes, placed on biPAP 6/2: Respiratory status improved, weaned off BiPAP to HFNC 50% FiO2 @ 60L 6/3: Overnight with agitation and unable to tolerate BiPAP with thick tan secretions, resulting in  hypoxia (O2 sats 60's) during coughing episode, transferred to [facility] ~ now weaned back to HFNC, add IV steroids for AECOPD 6/5: Tolerated BiPAP for 3 hours last night 6/6: Remains on HHFNC (currently at 87% FiO2),BMP concerning for Contraction Metabolic Alkalosis due to aggressive Diuresis with Lasix ~ will hold Lasix and give Diamox + Metolazone; Sputum culture with Streptococcus Agalactiae ~ will start Ceftriaxone 6/7: Placed on BiPAP this morning due to SOB, CXR with new RUL alveolar infiltrate (placed on Rocephin yesterday and sputum cx with Streptococcus Agalactiae. Will decrease Diamox to 500 mg daily, continue Metolazone 2.5 mg daily 6/8: On BiPAP overnight (50% FiO2) and tolerating, plan for HHFNC during day, diuresing well and contraction alkalosis continues to improve with Diamox + metolazone 6/9: Ceftriaxone switched to Cefazolin, remains on HHFNC (currently @ 65% FiO2), diuresing well,  Creatinine remains stable, foley catheter removed 6/10: CT chest concerning for R>L multifocal opacity (consistent with pnuemonia vs. PAH), along with progression of previous abdominal adenopathy (consistent with Lymphoma); Oncology consulted, Holding diuresis due to soft BP and slight increase in serum Creatinine, decrease steroids 02/06/21-patient remains on high flow nasal cannula with fragmented speech, I met with family at bedside including wife and daughter.  Patient still unable to lean back in supine position and we reviewed possibly needing cardiac cath in the future however he is very nervous about this and we explained that this will be done only when he is in chronic stable state.  He still has bilateral wheezing in all lung zones on exam today.  Has not had hemoptysis during my shift today. 02/07/21- patient still on HFNC, family at bedside. Reviewed CT with them and atelcetasis on rll will start Metaneb 02/08/21- patient is weaning down on FiO2 still on HFNC 02/09/21- patient weaned to bubbler 8L/min via [place].  Plan to optimize for TR[facility] transfer.     Assessment & Plan:   Principal Problem:   Acute on chronic diastolic CHF (congestive heart failure) (HCC) Active Problems:   Type 2 diabetes mellitus without complication (HCC)   Acute on chronic respiratory failure (HCC)   Hyperkalemia   Obesity (BMI 30-39.9)   Acute on chronic congestive heart failure (HCC)   Acute cor pulmonale (HCC)   Lymphoma, small lymphocytic (HCC)   Metabolic alkalosis   Acute metabolic encephalopathy  #1.  Acute on chronic hypoxemic respiratory failure. Acute on chronic diastolic congestive heart failure. Essential hypertension. Patient condition is improving, currently on 5 L oxygen.  No significant short of breath, able to walk for few steps.  Today, patient is euvolemic, already on oral Lasix.  2.  Streptococcus Agalactiae pneumonia. Hemoptysis Lymphoma. Antibiotics completed.  Hemoptysis much better  today.  He only has some small amount of mucus.  #3.  Leukocytosis. Procalcitonin level less than 0.1, antibiotics completed. Weaning down steroids.  4.  Uncontrolled type 2 diabetes with hyperglycemia, no hypoglycemia Patient glucose running low now since glucose weaning down.  I will discontinue Lantus and scheduled insulin, now, continue sliding scale insulin.    #5.  Acute metabolic encephalopathy. Condition improved.    DVT prophylaxis: SCDs Code Status: partial Family Communication:  Disposition Plan:    Status is: Inpatient  Remains inpatient appropriate because:Inpatient level of care appropriate due to severity of illness  Dispo: The patient is from: Home              Anticipated d/c is to: Home              Patient currently is not medically stable to d/c.   Difficult to place patient No        I/O last 3 completed shifts: In: 395.2 [P.O.:240; IV Piggyback:155.2] Out: 3664 [Urine:3650] Total I/O In: 480 [P.O.:480] Out: 350 [Urine:350]     Consultants:  Pulm, card  Procedures: None  Antimicrobials: None  Subjective: Patient feels better, currently on 5 L oxygen, no significant short of breath.   Patient had a hypoglycemia overnight. Hemoptysis essentially resolved. No fever or chills. No abdominal pain nausea vomiting. No dysuria hematuria. No chest pain or palpitation.  Objective: Vitals:   02/11/21 0749 02/11/21 0752 02/11/21 0807 02/11/21 1153  BP:  (!) 112/56 104/63 (!) 107/54  Pulse:  63 62 71  Resp:  17 17 17   Temp:  97.8 F (36.6 C)  98.2 F (36.8 C)  TempSrc:  Oral  Oral  SpO2: 94% 94% 98% 91%  Weight:   92.9 kg     Intake/Output Summary (Last 24 hours) at 02/11/2021 1212 Last data filed at 02/11/2021 1000 Gross per 24 hour  Intake 720 ml  Output 2275 ml  Net -1555 ml   Filed Weights   02/02/21 0442 02/03/21 0500 02/11/21 0807  Weight: 96 kg 93.1 kg 92.9 kg    Examination:  General exam: Appears calm and comfortable   Respiratory system: Clear to auscultation. Respiratory effort normal. Cardiovascular system: S1 & S2 heard, RRR. No JVD, murmurs, rubs, gallops or clicks. No pedal edema. Gastrointestinal system: Abdomen is nondistended, soft and nontender. No organomegaly or masses felt. Normal bowel sounds heard. Central nervous system: Alert and oriented. No focal neurological deficits. Extremities: Symmetric 5 x 5 power. Skin: No rashes, lesions or ulcers Psychiatry: Judgement and insight appear normal. Mood & affect appropriate.     Data Reviewed: I have personally reviewed following labs and imaging studies  CBC: Recent Labs  Lab 02/07/21 0353 02/08/21 0444 02/09/21 0602 02/10/21 0325 02/11/21 0423  WBC 15.3* 16.9* 22.1* 21.4* 22.1*  NEUTROABS  --  11.7* 15.1* 15.7* 14.7*  HGB 9.5* 9.5* 10.7* 10.5* 10.8*  HCT 29.5* 30.2* 33.4* 33.0* 34.2*  MCV 93.4 95.6 93.3 95.1 94.7  PLT 276 292 312 289 403   Basic Metabolic Panel: Recent Labs  Lab 02/07/21 0353 02/08/21 0444 02/09/21 0602 02/10/21 0325 02/11/21 0423  NA 139 139 137 139 135  K 4.8 4.7 4.7 4.8 4.0  CL 97* 97* 93* 96* 92*  CO2 37* 39* 35* 41* 39*  GLUCOSE 138* 129* 141* 116* 93  BUN 52* 49* 42* 41*  49*  CREATININE 0.90 0.81 0.84 0.86 0.93  CALCIUM 8.5* 8.6* 8.8* 8.6* 8.4*  MG 2.3 2.3 2.3 2.1 2.2  PHOS 3.8 3.5 4.5 5.1* 4.2   GFR: Estimated Creatinine Clearance: 72.1 mL/min (by C-G formula based on SCr of 0.93 mg/dL). Liver Function Tests: No results for input(s): AST, ALT, ALKPHOS, BILITOT, PROT, ALBUMIN in the last 168 hours. No results for input(s): LIPASE, AMYLASE in the last 168 hours. No results for input(s): AMMONIA in the last 168 hours. Coagulation Profile: No results for input(s): INR, PROTIME in the last 168 hours. Cardiac Enzymes: No results for input(s): CKTOTAL, CKMB, CKMBINDEX, TROPONINI in the last 168 hours. BNP (last 3 results) No results for input(s): PROBNP in the last 8760 hours. HbA1C: No results  for input(s): HGBA1C in the last 72 hours. CBG: Recent Labs  Lab 02/11/21 0017 02/11/21 0055 02/11/21 0411 02/11/21 0804 02/11/21 1202  GLUCAP 50* 117* 104* 76 182*   Lipid Profile: No results for input(s): CHOL, HDL, LDLCALC, TRIG, CHOLHDL, LDLDIRECT in the last 72 hours. Thyroid Function Tests: No results for input(s): TSH, T4TOTAL, FREET4, T3FREE, THYROIDAB in the last 72 hours. Anemia Panel: No results for input(s): VITAMINB12, FOLATE, FERRITIN, TIBC, IRON, RETICCTPCT in the last 72 hours. Sepsis Labs: Recent Labs  Lab 02/10/21 0325  PROCALCITON <0.10    Recent Results (from the past 240 hour(s))  Expectorated Sputum Assessment w Gram Stain, Rflx to Resp Cult     Status: None   Collection Time: 02/05/21 11:59 PM   Specimen: Sputum  Result Value Ref Range Status   Specimen Description SPUTUM  Final   Special Requests NONE  Final   Sputum evaluation   Final    THIS SPECIMEN IS ACCEPTABLE FOR SPUTUM CULTURE Performed at [facility], [address] [place], [place] [address]    Report Status 02/06/2021 FINAL  Final  Culture, Respiratory w Gram Stain     Status: None   Collection Time: 02/05/21 11:59 PM   Specimen: SPU  Result Value Ref Range Status   Specimen Description   Final    SPUTUM Performed at [facility], [address], [place], [place] [address]    Special Requests   Final    NONE Reflexed from [id number] Performed at [facility], [address] [place], [place] [address]    Gram Stain   Final    NO WBC SEEN RARE BUDDING YEAST SEEN Performed at [facility], [place] [address], [place], [place] [address]    Culture   Final    ABUNDANT STENOTROPHOMONAS MALTOPHILIA RARE CANDIDA ALBICANS    Report Status 02/08/2021 FINAL  Final   Organism ID, Bacteria STENOTROPHOMONAS MALTOPHILIA  Final      Susceptibility   Stenotrophomonas maltophilia - MIC*    LEVOFLOXACIN 2 SENSITIVE Sensitive     TRIMETH/SULFA <=20 SENSITIVE  Sensitive     * ABUNDANT STENOTROPHOMONAS MALTOPHILIA         Radiology Studies: DG Chest Port 1 View  Result Date: 02/10/2021 CLINICAL DATA:  Shortness of breath EXAM: PORTABLE CHEST 1 VIEW COMPARISON:  February 06, 2021 chest radiograph and chest CT February 03, 2021 FINDINGS: There is underlying fibrotic change, more notable on the right than on the left. There has been partial but incomplete clearing of underlying airspace opacity on the right, particularly in the right mid lung. There remains patchy airspace opacity superimposed on fibrosis on the right. There is stable left midlung atelectasis. There is also left  base atelectasis. No new opacities evident. Heart is mildly enlarged with pulmonary vascular normal. No adenopathy. There is aortic atherosclerosis. No bone lesions. IMPRESSION: Partial but incomplete clearing of airspace opacity/infiltrate on the right. Areas of atelectatic change on the left are stable. Underlying fibrotic changes, more severe on the right left, stable. Stable cardiac enlargement. Aortic Atherosclerosis (ICD10-I70.0). Electronically Signed   By: Lowella Grip III M.D.   On: 02/10/2021 11:05        Scheduled Meds:  arformoterol  15 mcg Nebulization BID   vitamin C  500 mg Oral Daily   atorvastatin  40 mg Oral Daily   budesonide (PULMICORT) nebulizer solution  0.5 mg Nebulization BID   Chlorhexidine Gluconate Cloth  6 each Topical Q0600   fluticasone  1 spray Each Nare BID   furosemide  40 mg Oral BID   gabapentin  300 mg Oral TID   insulin aspart  0-20 Units Subcutaneous Q4H   insulin aspart  7 Units Subcutaneous TID AC   insulin glargine  20 Units Subcutaneous Daily   melatonin  5 mg Oral QHS   metoprolol succinate  12.5 mg Oral Daily   predniSONE  20 mg Oral Q breakfast   revefenacin  175 mcg Nebulization Daily   sodium chloride flush  3 mL Intravenous Q12H   traZODone  100 mg Oral QHS   Continuous Infusions:  sodium chloride       LOS: 18 days     Time spent: 28 minutes    Sharen Hones, MD [facility]   To contact the attending provider between 7A-7P or the covering provider during after hours 7P-7A, please log into the web site [email or web address] and access using universal [place] password for that web site. If you do not have the password, please call the hospital operator.  02/11/2021, 12:12 PM

## 2021-02-11 NOTE — Progress Notes (Signed)
Progress Note  Patient Name: Julian Sparks Date of Encounter: 02/11/2021  Primary Cardiologist: Ida Rogue, MD  Subjective   Feels well this morning.  Breathing much improved and down to 5 L/min on oxygen.  Still coughing some but no longer bringing up significant amounts of bloody sputum or clots.  Inpatient Medications    Scheduled Meds:  arformoterol  15 mcg Nebulization BID   vitamin C  500 mg Oral Daily   atorvastatin  40 mg Oral Daily   budesonide (PULMICORT) nebulizer solution  0.5 mg Nebulization BID   Chlorhexidine Gluconate Cloth  6 each Topical Q0600   fluticasone  1 spray Each Nare BID   furosemide  40 mg Oral BID   gabapentin  300 mg Oral TID   insulin aspart  0-20 Units Subcutaneous Q4H   insulin aspart  7 Units Subcutaneous TID AC   insulin glargine  20 Units Subcutaneous Daily   melatonin  5 mg Oral QHS   metoprolol succinate  12.5 mg Oral Daily   predniSONE  20 mg Oral Q breakfast   revefenacin  175 mcg Nebulization Daily   sodium chloride flush  3 mL Intravenous Q12H   traZODone  100 mg Oral QHS   Continuous Infusions:  sodium chloride     PRN Meds: sodium chloride, acetaminophen, albuterol, clonazepam, cyclobenzaprine, guaiFENesin, ondansetron (ZOFRAN) IV, sodium chloride flush   Vital Signs    Vitals:   02/11/21 0408 02/11/21 0749 02/11/21 0752 02/11/21 0807  BP: 129/72  (!) 112/56 104/63  Pulse: 69  63 62  Resp: 18   17  Temp: (!) 97.5 F (36.4 C)  97.8 F (36.6 C)   TempSrc: Oral  Oral   SpO2: 96% 94% 94% 98%  Weight:    92.9 kg    Intake/Output Summary (Last 24 hours) at 02/11/2021 0828 Last data filed at 02/11/2021 0806 Gross per 24 hour  Intake 240 ml  Output 2625 ml  Net -2385 ml   Filed Weights   02/02/21 0442 02/03/21 0500 02/11/21 0807  Weight: 96 kg 93.1 kg 92.9 kg    Physical Exam   GEN: Well nourished, well developed, in no acute distress.  HEENT: Grossly normal.  Neck: Supple, no JVD, carotid bruits, or  masses. Cardiac: RRR, distant heart sounds.  No murmurs, rubs, or gallops. No clubbing, cyanosis, trace right lower extremity edema.  Radials 2+, DP/PT 1+ and equal bilaterally.  Respiratory:  Respirations regular and unlabored, coarse breath sounds throughout/rhonchi.GI: Soft, nontender, nondistended, BS + x 4. MS: no deformity or atrophy. Skin: warm and dry, no rash. Neuro:  Strength and sensation are intact. Psych: AAOx3.  Normal affect.  Labs    Chemistry Recent Labs  Lab 02/09/21 0602 02/10/21 0325 02/11/21 0423  NA 137 139 135  K 4.7 4.8 4.0  CL 93* 96* 92*  CO2 35* 41* 39*  GLUCOSE 141* 116* 93  BUN 42* 41* 49*  CREATININE 0.84 0.86 0.93  CALCIUM 8.8* 8.6* 8.4*  GFRNONAA >60 >60 >60  ANIONGAP 9 2* 4*     Hematology Recent Labs  Lab 02/09/21 0602 02/10/21 0325 02/11/21 0423  WBC 22.1* 21.4* 22.1*  RBC 3.58* 3.47* 3.61*  HGB 10.7* 10.5* 10.8*  HCT 33.4* 33.0* 34.2*  MCV 93.3 95.1 94.7  MCH 29.9 30.3 29.9  MCHC 32.0 31.8 31.6  RDW 15.4 15.8* 16.1*  PLT 312 289 285    Cardiac Enzymes  Recent Labs  Lab 01/24/21 1132  TROPONINIHS 13  Lipids  Lab Results  Component Value Date   CHOL 101 01/26/2021   HDL 46 01/26/2021   LDLCALC 36 01/26/2021   TRIG 97 01/26/2021   CHOLHDL 2.2 01/26/2021    HbA1c  Lab Results  Component Value Date   HGBA1C 6.8 (H) 01/24/2021    Radiology    DG Chest Port 1 View  Result Date: 02/10/2021 CLINICAL DATA:  Shortness of breath EXAM: PORTABLE CHEST 1 VIEW COMPARISON:  February 06, 2021 chest radiograph and chest CT February 03, 2021 FINDINGS: There is underlying fibrotic change, more notable on the right than on the left. There has been partial but incomplete clearing of underlying airspace opacity on the right, particularly in the right mid lung. There remains patchy airspace opacity superimposed on fibrosis on the right. There is stable left midlung atelectasis. There is also left base atelectasis. No new opacities evident.  Heart is mildly enlarged with pulmonary vascular normal. No adenopathy. There is aortic atherosclerosis. No bone lesions. IMPRESSION: Partial but incomplete clearing of airspace opacity/infiltrate on the right. Areas of atelectatic change on the left are stable. Underlying fibrotic changes, more severe on the right left, stable. Stable cardiac enlargement. Aortic Atherosclerosis (ICD10-I70.0). Electronically Signed   By: Lowella Grip III M.D.   On: 02/10/2021 11:05    Telemetry    Regular sinus rhythm PVCs and PACs.  Brief run of atrial tachycardia.  Occasional junctional rhythm/accelerated idioventricular rhythm- Personally Reviewed  Cardiac Studies   2D Echocardiogram 5.31.2022    1. Left ventricular ejection fraction, by estimation, is 50 to 55%. The  left ventricle has low normal function. Hypokinesis of septal wall. Left  ventricular diastolic parameters are consistent with Grade II diastolic  dysfunction (pseudonormalization).   2. Right ventricular systolic function is moderately reduced. The right  ventricular size is severely enlarged. Tricuspid regurgitation signal is  inadequate for assessing PA pressure.   3. Right atrial size was mildly dilated.   Patient Profile     75 y.o. male with history of HFpEF, COPD, hypoxic respiratory failure presenting with shortness of breath, diagnosed with hypoxia and hypercapnic respiratory failure.  Being seen for HFpEF.  Assessment & Plan    1.  Acute on chronic hypoxic and hypercapnic respiratory failure/pneumonia: Multifactorial in the setting of AE COPD, S agalactiae PNA, post COVID pneumonitis/fibrosis, and HFpEF.  Oxygen able to be weaned down to 5 L/min from high flow O2 on June 17th, and he is currently saturating at 98%.  He feels that breathing continues to improve.  Still coughing some but no longer bringing up copious amounts of bloody sputum.  Coarse rhonchi on exam.  Steroids, nebulizers, antibiotics per internal  medicine.  2.  Acute on chronic heart failure with preserved ejection fraction/pulmonary arterial hypertension: EF 50-55% by echocardiogram this admission with grade 2 diastolic dysfunction.  Septal hypokinesis and severe RV enlargement also noted.  He has been on oral Lasix and this was increased to 40 mg twice daily yesterday.  He continues to have good urine output.  Intakes and outputs appear to be inaccurate as intake is often missing.  Weight stable at 92.9 kg.  Renal function remains stable.  Heart rate and blood pressure stable.  Continue beta-blocker and diuretic therapy at current doses.  3.  Hyperlipidemia: LDL 36 on statin therapy.  4.  Type 2 diabetes mellitus: A1c 6.8.  Insulin management per internal medicine.  5.  Lymphoma/abdominal adenopathy: Seen by oncology with plan for outpatient follow-up.  Signed, Murray Hodgkins,  NP  02/11/2021, 8:28 AM    For questions or updates, please contact   Please consult [email or web address] for contact info under Cardiology/STEMI.

## 2021-02-12 DIAGNOSIS — J9621 Acute and chronic respiratory failure with hypoxia: Secondary | ICD-10-CM | POA: Diagnosis not present

## 2021-02-12 DIAGNOSIS — J9622 Acute and chronic respiratory failure with hypercapnia: Secondary | ICD-10-CM | POA: Diagnosis not present

## 2021-02-12 DIAGNOSIS — I2609 Other pulmonary embolism with acute cor pulmonale: Secondary | ICD-10-CM | POA: Diagnosis not present

## 2021-02-12 DIAGNOSIS — G9341 Metabolic encephalopathy: Secondary | ICD-10-CM | POA: Diagnosis not present

## 2021-02-12 DIAGNOSIS — E119 Type 2 diabetes mellitus without complications: Secondary | ICD-10-CM | POA: Diagnosis not present

## 2021-02-12 DIAGNOSIS — I5033 Acute on chronic diastolic (congestive) heart failure: Secondary | ICD-10-CM | POA: Diagnosis not present

## 2021-02-12 LAB — CBC WITH DIFFERENTIAL/PLATELET
Abs Immature Granulocytes: 0.62 10*3/uL — ABNORMAL HIGH (ref 0.00–0.07)
Basophils Absolute: 0.1 10*3/uL (ref 0.0–0.1)
Basophils Relative: 0 %
Eosinophils Absolute: 0.2 10*3/uL (ref 0.0–0.5)
Eosinophils Relative: 1 %
HCT: 36.5 % — ABNORMAL LOW (ref 39.0–52.0)
Hemoglobin: 11.5 g/dL — ABNORMAL LOW (ref 13.0–17.0)
Immature Granulocytes: 3 %
Lymphocytes Relative: 18 %
Lymphs Abs: 3.9 10*3/uL (ref 0.7–4.0)
MCH: 29.6 pg (ref 26.0–34.0)
MCHC: 31.5 g/dL (ref 30.0–36.0)
MCV: 94.1 fL (ref 80.0–100.0)
Monocytes Absolute: 1.3 10*3/uL — ABNORMAL HIGH (ref 0.1–1.0)
Monocytes Relative: 6 %
Neutro Abs: 15.8 10*3/uL — ABNORMAL HIGH (ref 1.7–7.7)
Neutrophils Relative %: 72 %
Platelets: 249 10*3/uL (ref 150–400)
RBC: 3.88 MIL/uL — ABNORMAL LOW (ref 4.22–5.81)
RDW: 16.2 % — ABNORMAL HIGH (ref 11.5–15.5)
WBC: 21.9 10*3/uL — ABNORMAL HIGH (ref 4.0–10.5)
nRBC: 0 % (ref 0.0–0.2)

## 2021-02-12 LAB — GLUCOSE, CAPILLARY
Glucose-Capillary: 111 mg/dL — ABNORMAL HIGH (ref 70–99)
Glucose-Capillary: 138 mg/dL — ABNORMAL HIGH (ref 70–99)
Glucose-Capillary: 149 mg/dL — ABNORMAL HIGH (ref 70–99)
Glucose-Capillary: 162 mg/dL — ABNORMAL HIGH (ref 70–99)
Glucose-Capillary: 187 mg/dL — ABNORMAL HIGH (ref 70–99)
Glucose-Capillary: 226 mg/dL — ABNORMAL HIGH (ref 70–99)
Glucose-Capillary: 296 mg/dL — ABNORMAL HIGH (ref 70–99)

## 2021-02-12 LAB — BASIC METABOLIC PANEL
Anion gap: 5 (ref 5–15)
BUN: 40 mg/dL — ABNORMAL HIGH (ref 8–23)
CO2: 41 mmol/L — ABNORMAL HIGH (ref 22–32)
Calcium: 8.6 mg/dL — ABNORMAL LOW (ref 8.9–10.3)
Chloride: 93 mmol/L — ABNORMAL LOW (ref 98–111)
Creatinine, Ser: 0.82 mg/dL (ref 0.61–1.24)
GFR, Estimated: >60 mL/min (ref >60)
Glucose, Bld: 162 mg/dL — ABNORMAL HIGH (ref 70–99)
Potassium: 4.1 mmol/L (ref 3.5–5.1)
Sodium: 139 mmol/L (ref 135–145)

## 2021-02-12 LAB — MAGNESIUM: Magnesium: 2.3 mg/dL (ref 1.7–2.4)

## 2021-02-12 LAB — BRAIN NATRIURETIC PEPTIDE: B Natriuretic Peptide: 75.4 pg/mL (ref 0.0–100.0)

## 2021-02-12 LAB — PHOSPHORUS: Phosphorus: 3.3 mg/dL (ref 2.5–4.6)

## 2021-02-12 MED ORDER — ACETYLCYSTEINE 20 % IN SOLN
2.0000 mL | Freq: Three times a day (TID) | RESPIRATORY_TRACT | Status: DC
  Administered 2021-02-12 – 2021-02-13 (×5): 2 mL via RESPIRATORY_TRACT
  Administered 2021-02-14: 4 mL via RESPIRATORY_TRACT
  Filled 2021-02-12 (×8): qty 4

## 2021-02-12 MED ORDER — ACETYLCYSTEINE 10% NICU INHALATION SOLUTION: 2.0000 mL | Freq: Three times a day (TID) | RESPIRATORY_TRACT | Status: DC

## 2021-02-12 MED ORDER — FUROSEMIDE 40 MG PO TABS
40.0000 mg | ORAL_TABLET | Freq: Every day | ORAL | Status: DC
  Administered 2021-02-13 – 2021-02-14 (×2): 40 mg via ORAL
  Filled 2021-02-12 (×2): qty 1

## 2021-02-12 NOTE — Progress Notes (Signed)
Progress Note  Patient Name: Julian Sparks Date of Encounter: 02/12/2021  Primary Cardiologist: Ida Rogue, MD  Subjective   Feels well this AM.  Remains on O2 @ 6lpm after desaturating on 2l (prior home rate).  Coughed up some bloody sputum around 3am, but otw doing well.  Inpatient Medications    Scheduled Meds:  arformoterol  15 mcg Nebulization BID   vitamin C  500 mg Oral Daily   atorvastatin  40 mg Oral Daily   budesonide (PULMICORT) nebulizer solution  0.5 mg Nebulization BID   Chlorhexidine Gluconate Cloth  6 each Topical Q0600   fluticasone  1 spray Each Nare BID   furosemide  40 mg Oral BID   gabapentin  300 mg Oral TID   insulin aspart  0-20 Units Subcutaneous Q4H   melatonin  5 mg Oral QHS   metoprolol succinate  12.5 mg Oral Daily   predniSONE  20 mg Oral Q breakfast   revefenacin  175 mcg Nebulization Daily   sodium chloride flush  3 mL Intravenous Q12H   traZODone  100 mg Oral QHS   Continuous Infusions:  sodium chloride     PRN Meds: sodium chloride, acetaminophen, albuterol, clonazepam, cyclobenzaprine, guaiFENesin, ondansetron (ZOFRAN) IV, sodium chloride flush   Vital Signs    Vitals:   02/11/21 1600 02/11/21 1946 02/12/21 0323 02/12/21 0328  BP: 120/60  126/67   Pulse: 75     Resp: 17     Temp: 98.2 F (36.8 C)  98.1 F (36.7 C)   TempSrc: Oral     SpO2: 94% 93% 92%   Weight:    90.2 kg    Intake/Output Summary (Last 24 hours) at 02/12/2021 0757 Last data filed at 02/12/2021 0748 Gross per 24 hour  Intake 1080 ml  Output 3500 ml  Net -2420 ml   Filed Weights   02/03/21 0500 02/11/21 0807 02/12/21 0328  Weight: 93.1 kg 92.9 kg 90.2 kg    Physical Exam   GEN: Well nourished, well developed, in no acute distress.  HEENT: Grossly normal.  Neck: Supple, no JVD, carotid bruits, or masses. Cardiac: RRR, distant heart sounds, no murmurs, rubs, or gallops. No clubbing, cyanosis, edema - legs appearing dry/pruned.  Radials 2+, DP/PT  1+ and equal bilaterally.  Respiratory:  Respirations regular and unlabored, diminished breath sounds bilat w/ scattered rhonchi and faint exp wheezing. GI: Soft, nontender, nondistended, BS + x 4. MS: no deformity or atrophy. Skin: warm and dry, no rash. Neuro:  Strength and sensation are intact. Psych: AAOx3.  Normal affect.  Labs    Chemistry Recent Labs  Lab 02/10/21 0325 02/11/21 0423 02/12/21 0540  NA 139 135 139  K 4.8 4.0 4.1  CL 96* 92* 93*  CO2 41* 39* 41*  GLUCOSE 116* 93 162*  BUN 41* 49* 40*  CREATININE 0.86 0.93 0.82  CALCIUM 8.6* 8.4* 8.6*  GFRNONAA >60 >60 >60  ANIONGAP 2* 4* 5     Hematology Recent Labs  Lab 02/10/21 0325 02/11/21 0423 02/12/21 0540  WBC 21.4* 22.1* 21.9*  RBC 3.47* 3.61* 3.88*  HGB 10.5* 10.8* 11.5*  HCT 33.0* 34.2* 36.5*  MCV 95.1 94.7 94.1  MCH 30.3 29.9 29.6  MCHC 31.8 31.6 31.5  RDW 15.8* 16.1* 16.2*  PLT 289 285 249    Cardiac Enzymes  Recent Labs  Lab 01/24/21 1132  TROPONINIHS 13     Lipids  Lab Results  Component Value Date   CHOL 101 01/26/2021  HDL 46 01/26/2021   LDLCALC 36 01/26/2021   TRIG 97 01/26/2021   CHOLHDL 2.2 01/26/2021    HbA1c  Lab Results  Component Value Date   HGBA1C 6.8 (H) 01/24/2021    Radiology    DG Chest Port 1 View  Result Date: 02/10/2021 CLINICAL DATA:  Shortness of breath EXAM: PORTABLE CHEST 1 VIEW COMPARISON:  February 06, 2021 chest radiograph and chest CT February 03, 2021 FINDINGS: There is underlying fibrotic change, more notable on the right than on the left. There has been partial but incomplete clearing of underlying airspace opacity on the right, particularly in the right mid lung. There remains patchy airspace opacity superimposed on fibrosis on the right. There is stable left midlung atelectasis. There is also left base atelectasis. No new opacities evident. Heart is mildly enlarged with pulmonary vascular normal. No adenopathy. There is aortic atherosclerosis. No bone  lesions. IMPRESSION: Partial but incomplete clearing of airspace opacity/infiltrate on the right. Areas of atelectatic change on the left are stable. Underlying fibrotic changes, more severe on the right left, stable. Stable cardiac enlargement. Aortic Atherosclerosis (ICD10-I70.0). Electronically Signed   By: Lowella Grip III M.D.   On: 02/10/2021 11:05    Telemetry    Regular sinus rhythm, PACs and PVCs- Personally Reviewed  Cardiac Studies   2D Echocardiogram 5.31.2022    1. Left ventricular ejection fraction, by estimation, is 50 to 55%. The  left ventricle has low normal function. Hypokinesis of septal wall. Left  ventricular diastolic parameters are consistent with Grade II diastolic  dysfunction (pseudonormalization).   2. Right ventricular systolic function is moderately reduced. The right  ventricular size is severely enlarged. Tricuspid regurgitation signal is  inadequate for assessing PA pressure.   3. Right atrial size was mildly dilated.   Patient Profile     75 y.o. male with history of HFpEF, COPD, hypoxic respiratory failure presenting with shortness of breath, diagnosed with hypoxia, PNA, and hypercapnic respiratory failure.  Being seen for HFpEF.  Assessment & Plan    1.  Acute on chronic hypoxic and hypercapnic respiratory failure/pneumonia: Multifactorial in the setting of COPD, S agalactiae PNA, post-COVID pneumonitis/fibrosis, and HFpEF.  Remains on oxygen at 6 L via nasal cannula.  Was previously on 2 L/min at home.  Continues to cough and had some thick bloody secretions overnight.  Scattered rhonchi and faint expiratory wheezing on exam.  Steroids and nebulizers per internal medicine.  He is completed a course of antibiotics.  Volume status looks good and in fact, he is looking a little dry.  2.  Acute on chronic heart failure with preserved ejection fraction/pulmonary arterial hypertension: EF 50-55% by echocardiogram this admission grade 2 diastolic  dysfunction.  Septal hypokinesis and severe RV enlargement also noted.  He has been on Lasix 40 mg p.o. twice daily and was minus 2.1L yesterday.  Wt down to 90.2kg last night, which is lowest recorded weight in past 4 years.  Looking dry on exam.  Bicarb up to 41. BUN/Creat stable @ 40/0.82.  Will reduce Lasix to 40 mg once daily.  Heart rate and blood pressure stable.  Continue beta-blocker.  Will consider addition of spironolactone as well as empagliflozin.  3.  Hyperlipidemia: LDL 36 on statin therapy.  4.  Type 2 diabetes mellitus: A1c 6.8.  Insulin management per internal medicine.  Consider adding SGLT2 inhibitor if not cost prohibitive.  5.  Lymphoma/abdominal adenopathy: Seen by oncology with plan for outpatient follow-up.  Signed, Murray Hodgkins, NP  02/12/2021, 7:57 AM    For questions or updates, please contact   Please consult [email or web address] for contact info under Cardiology/STEMI.

## 2021-02-12 NOTE — Progress Notes (Signed)
PROGRESS NOTE    Julian Sparks  WUJ:[id number] DOB: [birth year] DOA: 01/24/2021 PCP: [name], MD   Chief complaint.  Shortness of breath. Brief Narrative:  75 y.o. male with PMH as below presenting to the ED worsening shortness of breath from his baseline associated with bilateral lower extremity swelling as well as increased abdominal girth for about 2 days. Has generalized anasarca +hypoxia + chronic venous stasis with ulceration and was seen at the vascular clinic where he had on arrival placed bilaterally to both lower extremities. He was admitted to PCU with Severe ACUTE Hypoxic and Hypercapnic Respiratory Failure due to acute severe decompensated combined systloic and diastolic heart failure on BiPAP   5/31: Admitted for acute diastolic heart failure 6/1: Resp distress and mental status changes, placed on biPAP 6/2: Respiratory status improved, weaned off BiPAP to HFNC 50% FiO2 @ 60L 6/3: Overnight with agitation and unable to tolerate BiPAP with thick tan secretions, resulting in  hypoxia (O2 sats 60's) during coughing episode, transferred to [facility] ~ now weaned back to HFNC, add IV steroids for AECOPD 6/5: Tolerated BiPAP for 3 hours last night 6/6: Remains on HHFNC (currently at 87% FiO2),BMP concerning for Contraction Metabolic Alkalosis due to aggressive Diuresis with Lasix ~ will hold Lasix and give Diamox + Metolazone; Sputum culture with Streptococcus Agalactiae ~ will start Ceftriaxone 6/7: Placed on BiPAP this morning due to SOB, CXR with new RUL alveolar infiltrate (placed on Rocephin yesterday and sputum cx with Streptococcus Agalactiae. Will decrease Diamox to 500 mg daily, continue Metolazone 2.5 mg daily 6/8: On BiPAP overnight (50% FiO2) and tolerating, plan for HHFNC during day, diuresing well and contraction alkalosis continues to improve with Diamox + metolazone 6/9: Ceftriaxone switched to Cefazolin, remains on HHFNC (currently @ 65% FiO2), diuresing well,  Creatinine remains stable, foley catheter removed 6/10: CT chest concerning for R>L multifocal opacity (consistent with pnuemonia vs. PAH), along with progression of previous abdominal adenopathy (consistent with Lymphoma); Oncology consulted, Holding diuresis due to soft BP and slight increase in serum Creatinine, decrease steroids 02/06/21-patient remains on high flow nasal cannula with fragmented speech, I met with family at bedside including wife and daughter.  Patient still unable to lean back in supine position and we reviewed possibly needing cardiac cath in the future however he is very nervous about this and we explained that this will be done only when he is in chronic stable state.  He still has bilateral wheezing in all lung zones on exam today.  Has not had hemoptysis during my shift today. 02/07/21- patient still on HFNC, family at bedside. Reviewed CT with them and atelcetasis on rll will start Metaneb 02/08/21- patient is weaning down on FiO2 still on HFNC 02/09/21- patient weaned to bubbler 8L/min via [place].  Plan to optimize for [facility] transfer.   Assessment & Plan:   Principal Problem:   Acute on chronic diastolic CHF (congestive heart failure) (HCC) Active Problems:   Type 2 diabetes mellitus without complication (HCC)   Acute on chronic respiratory failure (HCC)   Hyperkalemia   Obesity (BMI 30-39.9)   Acute on chronic congestive heart failure (HCC)   Acute cor pulmonale (HCC)   Lymphoma, small lymphocytic (HCC)   Metabolic alkalosis   Acute metabolic encephalopathy  #1.  Acute on chronic hypoxemic respiratory failure. Acute on chronic diastolic congestive heart failure. Essential hypertension. Patient have increased altered requirement to 6 L this morning, was able to wean down to 5 L yesterday.  Patient had a  worsening short of breath last night, discussed with Dr. Rockey Situ, patient may have significant secretion, not necessarily has volume overload. I will recheck a BNP.   Patient currently on 40 mg oral Lasix.  2.  Streptococcus Agalactiae pneumonia. Hemoptysis Lymphoma. Patient has completed antibiotics.  Still has hemoptysis with clots, patient also had very thick mucus.  Added Mucomyst. Will continue to follow, will consult pulmonology if hemoptysis does not getting better. I will continue wean off steroids.  3.  Uncontrolled type 2 diabetes with hyperglycemia and hypoglycemia. Discontinued long-acting insulin due to hypoglycemia.  We will continue sliding's insulin for now.  #4.  Persistent leukocytosis. Continue with steroids, no evidence of additional infection.    DVT prophylaxis: SCDs Code Status: Full Family Communication:  Disposition Plan:    Status is: Inpatient  Remains inpatient appropriate because:Inpatient level of care appropriate due to severity of illness  Dispo: The patient is from: Home              Anticipated d/c is to: Home              Patient currently is not medically stable to d/c.   Difficult to place patient No        I/O last 3 completed shifts: In: 1080 [P.O.:1080] Out: 4875 [Urine:4875] Total I/O In: 46 [P.O.:480] Out: 75 [Urine:425]     Consultants:  Card  Procedures: None  Antimicrobials: None   Subjective: Patient had increased oxygen requirement today.  He had an episode of short of breath and cough at 3 AM last night.  His oxygen was brought down to 4 L this morning, he developed worsening hypoxemia, oxygen was brought up to 6 L. Have thick mucus with cough, occasionally with blood clots. No fever or chills. No abdominal pain or nausea vomiting. No dysuria hematuria.  Objective: Vitals:   02/12/21 0323 02/12/21 0328 02/12/21 0758 02/12/21 0807  BP: 126/67  104/63   Pulse:   69   Resp:   18   Temp: 98.1 F (36.7 C)  97.8 F (36.6 C)   TempSrc:      SpO2: 92%  92% 91%  Weight:  90.2 kg      Intake/Output Summary (Last 24 hours) at 02/12/2021 1038 Last data filed at  02/12/2021 1013 Gross per 24 hour  Intake 1080 ml  Output 3350 ml  Net -2270 ml   Filed Weights   02/03/21 0500 02/11/21 0807 02/12/21 0328  Weight: 93.1 kg 92.9 kg 90.2 kg    Examination:  General exam: Appears calm and comfortable  Respiratory system: Coarse breathing sounds.  Respiratory effort normal. Cardiovascular system: S1 & S2 heard, RRR. No JVD, murmurs, rubs, gallops or clicks. No pedal edema. Gastrointestinal system: Abdomen is nondistended, soft and nontender. No organomegaly or masses felt. Normal bowel sounds heard. Central nervous system: Alert and oriented. No focal neurological deficits. Extremities: Symmetric 5 x 5 power. Skin: No rashes, lesions or ulcers Psychiatry: Judgement and insight appear normal. Mood & affect appropriate.     Data Reviewed: I have personally reviewed following labs and imaging studies  CBC: Recent Labs  Lab 02/08/21 0444 02/09/21 0602 02/10/21 0325 02/11/21 0423 02/12/21 0540  WBC 16.9* 22.1* 21.4* 22.1* 21.9*  NEUTROABS 11.7* 15.1* 15.7* 14.7* 15.8*  HGB 9.5* 10.7* 10.5* 10.8* 11.5*  HCT 30.2* 33.4* 33.0* 34.2* 36.5*  MCV 95.6 93.3 95.1 94.7 94.1  PLT 292 312 289 285 220   Basic Metabolic Panel: Recent Labs  Lab 02/08/21 0444  02/09/21 0602 02/10/21 0325 02/11/21 0423 02/12/21 0540  NA 139 137 139 135 139  K 4.7 4.7 4.8 4.0 4.1  CL 97* 93* 96* 92* 93*  CO2 39* 35* 41* 39* 41*  GLUCOSE 129* 141* 116* 93 162*  BUN 49* 42* 41* 49* 40*  CREATININE 0.81 0.84 0.86 0.93 0.82  CALCIUM 8.6* 8.8* 8.6* 8.4* 8.6*  MG 2.3 2.3 2.1 2.2 2.3  PHOS 3.5 4.5 5.1* 4.2 3.3   GFR: Estimated Creatinine Clearance: 80.7 mL/min (by C-G formula based on SCr of 0.82 mg/dL). Liver Function Tests: No results for input(s): AST, ALT, ALKPHOS, BILITOT, PROT, ALBUMIN in the last 168 hours. No results for input(s): LIPASE, AMYLASE in the last 168 hours. No results for input(s): AMMONIA in the last 168 hours. Coagulation Profile: No results  for input(s): INR, PROTIME in the last 168 hours. Cardiac Enzymes: No results for input(s): CKTOTAL, CKMB, CKMBINDEX, TROPONINI in the last 168 hours. BNP (last 3 results) No results for input(s): PROBNP in the last 8760 hours. HbA1C: No results for input(s): HGBA1C in the last 72 hours. CBG: Recent Labs  Lab 02/11/21 1625 02/11/21 2055 02/12/21 0039 02/12/21 0339 02/12/21 0747  GLUCAP 292* 150* 149* 138* 162*   Lipid Profile: No results for input(s): CHOL, HDL, LDLCALC, TRIG, CHOLHDL, LDLDIRECT in the last 72 hours. Thyroid Function Tests: No results for input(s): TSH, T4TOTAL, FREET4, T3FREE, THYROIDAB in the last 72 hours. Anemia Panel: No results for input(s): VITAMINB12, FOLATE, FERRITIN, TIBC, IRON, RETICCTPCT in the last 72 hours. Sepsis Labs: Recent Labs  Lab 02/10/21 0325  PROCALCITON <0.10    Recent Results (from the past 240 hour(s))  Expectorated Sputum Assessment w Gram Stain, Rflx to Resp Cult     Status: None   Collection Time: 02/05/21 11:59 PM   Specimen: Sputum  Result Value Ref Range Status   Specimen Description SPUTUM  Final   Special Requests NONE  Final   Sputum evaluation   Final    THIS SPECIMEN IS ACCEPTABLE FOR SPUTUM CULTURE Performed at [facility], [address], [place], [place] [address]    Report Status 02/06/2021 FINAL  Final  Culture, Respiratory w Gram Stain     Status: None   Collection Time: 02/05/21 11:59 PM   Specimen: SPU  Result Value Ref Range Status   Specimen Description   Final    SPUTUM Performed at [facility], [address] [place], [place] [address]    Special Requests   Final    NONE Reflexed from [id number] Performed at [facility], [address] [place], [place] [address]    Gram Stain   Final    NO WBC SEEN RARE BUDDING YEAST SEEN Performed at [facility], [place] [address], [place], [place] [address]    Culture   Final    ABUNDANT STENOTROPHOMONAS MALTOPHILIA RARE  CANDIDA ALBICANS    Report Status 02/08/2021 FINAL  Final   Organism ID, Bacteria STENOTROPHOMONAS MALTOPHILIA  Final      Susceptibility   Stenotrophomonas maltophilia - MIC*    LEVOFLOXACIN 2 SENSITIVE Sensitive     TRIMETH/SULFA <=20 SENSITIVE Sensitive     * ABUNDANT STENOTROPHOMONAS MALTOPHILIA         Radiology Studies: DG Chest Port 1 View  Result Date: 02/10/2021 CLINICAL DATA:  Shortness of breath EXAM: PORTABLE CHEST 1 VIEW COMPARISON:  February 06, 2021 chest radiograph and chest CT February 03, 2021 FINDINGS: There is underlying fibrotic change, more  notable on the right than on the left. There has been partial but incomplete clearing of underlying airspace opacity on the right, particularly in the right mid lung. There remains patchy airspace opacity superimposed on fibrosis on the right. There is stable left midlung atelectasis. There is also left base atelectasis. No new opacities evident. Heart is mildly enlarged with pulmonary vascular normal. No adenopathy. There is aortic atherosclerosis. No bone lesions. IMPRESSION: Partial but incomplete clearing of airspace opacity/infiltrate on the right. Areas of atelectatic change on the left are stable. Underlying fibrotic changes, more severe on the right left, stable. Stable cardiac enlargement. Aortic Atherosclerosis (ICD10-I70.0). Electronically Signed   By: Lowella Grip III M.D.   On: 02/10/2021 11:05        Scheduled Meds:  acetylcysteine  2 mL Nebulization TID   arformoterol  15 mcg Nebulization BID   vitamin C  500 mg Oral Daily   atorvastatin  40 mg Oral Daily   budesonide (PULMICORT) nebulizer solution  0.5 mg Nebulization BID   Chlorhexidine Gluconate Cloth  6 each Topical Q0600   fluticasone  1 spray Each Nare BID   furosemide  40 mg Oral BID   gabapentin  300 mg Oral TID   insulin aspart  0-20 Units Subcutaneous Q4H   melatonin  5 mg Oral QHS   metoprolol succinate  12.5 mg Oral Daily   predniSONE  20 mg Oral Q  breakfast   revefenacin  175 mcg Nebulization Daily   sodium chloride flush  3 mL Intravenous Q12H   traZODone  100 mg Oral QHS   Continuous Infusions:  sodium chloride       LOS: 19 days    Time spent: 32 minutes    Sharen Hones, MD [facility]   To contact the attending provider between 7A-7P or the covering provider during after hours 7P-7A, please log into the web site [email or web address] and access using universal [place] password for that web site. If you do not have the password, please call the hospital operator.  02/12/2021, 10:38 AM

## 2021-02-12 NOTE — Plan of Care (Signed)
  Problem: Clinical Measurements: Goal: Respiratory complications will improve Outcome: Progressing   

## 2021-02-12 NOTE — Progress Notes (Signed)
Physical Therapy Treatment Patient Details Name: Julian Sparks MRN: [id number] DOB: [birth year] Today's Date: 02/12/2021    History of Present Illness presented to ER secondary to progressive SOB, LE and abdominal edema; admitted for management of acute/chronic hypoxic, hypercapnic respiratory failure due to CHF/COPD.    PT Comments    Pt received seated in recliner on 5L oxygen with SpO2: 85% - pt reported he just had a coughing spell where he coughed up some blood which is why is oxygen is low. Pt cued to complete pursed lip breathing and was able to improve SpO2 reading to 92% prior to ambulation. Pt eager/motivated to ambulate. Pt completed sit<>stand transfer with SBA, SpO2 dropped to 88% - oxygen increased to 6L and pt's SpO2 increased to 94%. Completed 2 rounds of ambulation for 24 ft each (standing rest break for 2 min between rounds with SpO2 maintaining >88%,HR elevated to 104BPM but decreased to 80 BPM with break) with RW, CGA and PT managing oxygen tubing. Pt required 2 min seated rest break with PLB following 48 ft of ambulation maintaining SpO2 >88 %. Completed 1 final round of 24 ft using RW and CGA, pt verbalized fatigue following ambulation and requested to rest. Vcs provided throughout amulation for pursed lip breathing and posture. Overall pt tolerated interventions well today, but continued O2 needs and decreased endurance continue to indicate pt is appropriate for STR upon discharge at this time.   Follow Up Recommendations  SNF     Equipment Recommendations  None recommended by PT    Recommendations for Other Services       Precautions / Restrictions Precautions Precautions: Fall Restrictions Weight Bearing Restrictions: No    Mobility  Bed Mobility               General bed mobility comments: seated in recliner beginning/end of treatment session    Transfers Overall transfer level: Needs assistance Equipment used: Rolling walker (2 wheeled) Transfers:  Sit to/from Stand Sit to Stand: Min guard         General transfer comment: Pt provided VCs througout for appropriate RW use, and pursed lip breathing to maintain SpO2  Ambulation/Gait Ambulation/Gait assistance: Min guard Gait Distance (Feet): 72 Feet (Pt ambulated 3 rounds at 27ft each - he required a seated rest break for the 3rd round) Assistive device: Rolling walker (2 wheeled) Gait Pattern/deviations: Decreased step length - left;Decreased step length - right;Decreased stride length Gait velocity: Decreased   General Gait Details: Pt required 6L continuous O2 throughout ambulation today to maintain SpO2 >88%   Stairs             Wheelchair Mobility    Modified Rankin (Stroke Patients Only)       Balance Overall balance assessment: Needs assistance Sitting-balance support: No upper extremity supported;Feet supported Sitting balance-Leahy Scale: Good Sitting balance - Comments: Pt able to sit at the edge of his chair without his back supported completing dynamic reaching activities safely   Standing balance support: Bilateral upper extremity supported Standing balance-Leahy Scale: Fair Standing balance comment: Pt with decreased endurance in the standing position demonstrates increaed trunk excursion without LOB requiring BIL UE assist with RW                            Cognition Arousal/Alertness: Awake/alert Behavior During Therapy: WFL for tasks assessed/performed Overall Cognitive Status: Within Functional Limits for tasks assessed  General Comments: Very motivated to participate, eager to walk more frequently he would like someone to come walk with him daily      Exercises Other Exercises Other Exercises: Pt provided education on appropriate pursed lip breathing technique and appropriate RW use to decrease fall risk with ambulation    General Comments        Pertinent Vitals/Pain Pain  Assessment: No/denies pain    Home Living                      Prior Function            PT Goals (current goals can now be found in the care plan section) Acute Rehab PT Goals Patient Stated Goal: to get stronger and get back home! does not want to go to rehab PT Goal Formulation: With patient Time For Goal Achievement: 02/15/21 Potential to Achieve Goals: Good Progress towards PT goals: Progressing toward goals    Frequency    Min 2X/week      PT Plan Current plan remains appropriate    Co-evaluation              AM-PAC PT "6 Clicks" Mobility   Outcome Measure  Help needed turning from your back to your side while in a flat bed without using bedrails?: None Help needed moving from lying on your back to sitting on the side of a flat bed without using bedrails?: A Little Help needed moving to and from a bed to a chair (including a wheelchair)?: A Little Help needed standing up from a chair using your arms (e.g., wheelchair or bedside chair)?: A Little Help needed to walk in hospital room?: A Little Help needed climbing 3-5 steps with a railing? : A Lot 6 Click Score: 18    End of Session Equipment Utilized During Treatment: Oxygen;Gait belt Activity Tolerance: Patient tolerated treatment well Patient left: in chair;with call bell/phone within reach;with nursing/sitter in room   PT Visit Diagnosis: Difficulty in walking, not elsewhere classified (R26.2);Muscle weakness (generalized) (M62.81)     Time: 8676-7209 PT Time Calculation (min) (ACUTE ONLY): 25 min  Charges:  $Gait Training: 23-37 mins                     Duanne Guess, PT, DPT 02/12/21, 3:23 PM    Julian Sparks 02/12/2021, 3:16 PM

## 2021-02-13 DIAGNOSIS — J9622 Acute and chronic respiratory failure with hypercapnia: Secondary | ICD-10-CM | POA: Diagnosis not present

## 2021-02-13 DIAGNOSIS — E119 Type 2 diabetes mellitus without complications: Secondary | ICD-10-CM | POA: Diagnosis not present

## 2021-02-13 DIAGNOSIS — I4719 Other supraventricular tachycardia: Secondary | ICD-10-CM

## 2021-02-13 DIAGNOSIS — I5033 Acute on chronic diastolic (congestive) heart failure: Secondary | ICD-10-CM | POA: Diagnosis not present

## 2021-02-13 DIAGNOSIS — I471 Supraventricular tachycardia: Secondary | ICD-10-CM

## 2021-02-13 DIAGNOSIS — J9621 Acute and chronic respiratory failure with hypoxia: Secondary | ICD-10-CM | POA: Diagnosis not present

## 2021-02-13 LAB — CBC WITH DIFFERENTIAL/PLATELET
Abs Immature Granulocytes: 0.29 10*3/uL — ABNORMAL HIGH (ref 0.00–0.07)
Basophils Absolute: 0 10*3/uL (ref 0.0–0.1)
Basophils Relative: 0 %
Eosinophils Absolute: 0.2 10*3/uL (ref 0.0–0.5)
Eosinophils Relative: 2 %
HCT: 33.4 % — ABNORMAL LOW (ref 39.0–52.0)
Hemoglobin: 10.4 g/dL — ABNORMAL LOW (ref 13.0–17.0)
Immature Granulocytes: 2 %
Lymphocytes Relative: 21 %
Lymphs Abs: 3.1 10*3/uL (ref 0.7–4.0)
MCH: 30 pg (ref 26.0–34.0)
MCHC: 31.1 g/dL (ref 30.0–36.0)
MCV: 96.3 fL (ref 80.0–100.0)
Monocytes Absolute: 0.8 10*3/uL (ref 0.1–1.0)
Monocytes Relative: 5 %
Neutro Abs: 10.6 10*3/uL — ABNORMAL HIGH (ref 1.7–7.7)
Neutrophils Relative %: 70 %
Platelets: 209 10*3/uL (ref 150–400)
RBC: 3.47 MIL/uL — ABNORMAL LOW (ref 4.22–5.81)
RDW: 16 % — ABNORMAL HIGH (ref 11.5–15.5)
WBC: 15 10*3/uL — ABNORMAL HIGH (ref 4.0–10.5)
nRBC: 0 % (ref 0.0–0.2)

## 2021-02-13 LAB — PHOSPHORUS: Phosphorus: 3.7 mg/dL (ref 2.5–4.6)

## 2021-02-13 LAB — GLUCOSE, CAPILLARY
Glucose-Capillary: 133 mg/dL — ABNORMAL HIGH (ref 70–99)
Glucose-Capillary: 154 mg/dL — ABNORMAL HIGH (ref 70–99)
Glucose-Capillary: 208 mg/dL — ABNORMAL HIGH (ref 70–99)
Glucose-Capillary: 342 mg/dL — ABNORMAL HIGH (ref 70–99)
Glucose-Capillary: 356 mg/dL — ABNORMAL HIGH (ref 70–99)
Glucose-Capillary: 271 mg/dL — ABNORMAL HIGH (ref 70–99)
Glucose-Capillary: 172 mg/dL — ABNORMAL HIGH (ref 70–99)

## 2021-02-13 LAB — MAGNESIUM: Magnesium: 2.3 mg/dL (ref 1.7–2.4)

## 2021-02-13 MED ORDER — METOPROLOL SUCCINATE ER 25 MG PO TB24
25.0000 mg | ORAL_TABLET | Freq: Every day | ORAL | Status: DC
  Administered 2021-02-14: 25 mg via ORAL
  Filled 2021-02-13: qty 1

## 2021-02-13 MED ORDER — PREDNISONE 10 MG PO TABS
10.0000 mg | ORAL_TABLET | Freq: Every day | ORAL | Status: DC
  Administered 2021-02-14: 10 mg via ORAL
  Filled 2021-02-13: qty 1

## 2021-02-13 MED ORDER — METOPROLOL SUCCINATE ER 25 MG PO TB24
12.5000 mg | ORAL_TABLET | Freq: Once | ORAL | Status: AC
  Administered 2021-02-13: 12.5 mg via ORAL
  Filled 2021-02-13: qty 1

## 2021-02-13 MED ORDER — INSULIN GLARGINE 100 UNIT/ML ~~LOC~~ SOLN
10.0000 [IU] | Freq: Every day | SUBCUTANEOUS | Status: DC
  Administered 2021-02-13: 10 [IU] via SUBCUTANEOUS
  Filled 2021-02-13 (×2): qty 0.1

## 2021-02-13 NOTE — Plan of Care (Signed)
  Problem: Education: Goal: Ability to demonstrate management of disease process will improve Outcome: Not Progressing Education with fluid restriction. No fluid restriction included in diet

## 2021-02-13 NOTE — Progress Notes (Signed)
PROGRESS NOTE    Julian Sparks  LKJ:[id number] DOB: Feb 16, 1946 DOA: 01/24/2021 PCP: [name], MD   Chief complaint.  Shortness of breath. Brief Narrative:  75 y.o. male with PMH as below presenting to the ED worsening shortness of breath from his baseline associated with bilateral lower extremity swelling as well as increased abdominal girth for about 2 days. Has generalized anasarca +hypoxia + chronic venous stasis with ulceration and was seen at the vascular clinic where he had on arrival placed bilaterally to both lower extremities. He was admitted to PCU with Severe ACUTE Hypoxic and Hypercapnic Respiratory Failure due to acute severe decompensated combined systloic and diastolic heart failure on BiPAP   5/31: Admitted for acute diastolic heart failure 6/1: Resp distress and mental status changes, placed on biPAP 6/2: Respiratory status improved, weaned off BiPAP to HFNC 50% FiO2 @ 60L 6/3: Overnight with agitation and unable to tolerate BiPAP with thick tan secretions, resulting in  hypoxia (O2 sats 60's) during coughing episode, transferred to [facility] ~ now weaned back to HFNC, add IV steroids for AECOPD 6/5: Tolerated BiPAP for 3 hours last night 6/6: Remains on HHFNC (currently at 87% FiO2),BMP concerning for Contraction Metabolic Alkalosis due to aggressive Diuresis with Lasix ~ will hold Lasix and give Diamox + Metolazone; Sputum culture with Streptococcus Agalactiae ~ will start Ceftriaxone 6/7: Placed on BiPAP this morning due to SOB, CXR with new RUL alveolar infiltrate (placed on Rocephin yesterday and sputum cx with Streptococcus Agalactiae. Will decrease Diamox to 500 mg daily, continue Metolazone 2.5 mg daily 6/8: On BiPAP overnight (50% FiO2) and tolerating, plan for HHFNC during day, diuresing well and contraction alkalosis continues to improve with Diamox + metolazone 6/9: Ceftriaxone switched to Cefazolin, remains on HHFNC (currently @ 65% FiO2), diuresing well,  Creatinine remains stable, foley catheter removed 6/10: CT chest concerning for R>L multifocal opacity (consistent with pnuemonia vs. PAH), along with progression of previous abdominal adenopathy (consistent with Lymphoma); Oncology consulted, Holding diuresis due to soft BP and slight increase in serum Creatinine, decrease steroids 02/06/21-patient remains on high flow nasal cannula with fragmented speech, I met with family at bedside including wife and daughter.  Patient still unable to lean back in supine position and we reviewed possibly needing cardiac cath in the future however he is very nervous about this and we explained that this will be done only when he is in chronic stable state.  He still has bilateral wheezing in all lung zones on exam today.  Has not had hemoptysis during my shift today. 02/07/21- patient still on HFNC, family at bedside. Reviewed CT with them and atelcetasis on rll will start Metaneb 02/08/21- patient is weaning down on FiO2 still on HFNC 02/09/21- patient weaned to bubbler 8L/min via [place].  Plan to optimize for TRH transfer.     Assessment & Plan:   Principal Problem:   Acute on chronic diastolic CHF (congestive heart failure) (HCC) Active Problems:   Type 2 diabetes mellitus without complication (HCC)   Acute on chronic respiratory failure (HCC)   Hyperkalemia   Obesity (BMI 30-39.9)   Acute on chronic congestive heart failure (HCC)   Acute cor pulmonale (HCC)   Lymphoma, small lymphocytic (HCC)   Metabolic alkalosis   Acute metabolic encephalopathy  #1.  Acute on chronic hypoxemic respiratory failure. Acute on chronic diastolic congestive heart failure. Essential hypertension. Patient euvolemic, on oral Lasix. Hypoxemia appears to be pulmonary related.  2.  Streptococcus Agalactiae pneumonia. Hemoptysis Lymphoma. Patient is  still coughing up significant mount of dark-colored mucus today, almost looks like chocolate.  Patient also states that he ate  chocolate on the weekend.  I wonder if patient is aspirating.  Obtain speech therapy evaluation with modified barium swallow. Mucus still pretty sick, continue Mucomyst. Patient still on 6 L oxygen, continue oxygen treatment and wean oxygen when necessary. Decrease steroids dose to prednisone 10 mg daily.  #3.  Uncontrolled type 2 diabetes with hyperglycemia and hypoglycemia. Continue sliding scale insulin, discontinued long-acting insulin.  Continue wean down steroids.  #4.  Persistent leukocytosis. Patient procalcitonin level has normalized, pneumonia has improved.  I will continue wean steroids, leukocytosis also improving.  DVT prophylaxis: SCDs Code Status: Partial Family Communication:  Disposition Plan:    Status is: Inpatient  Remains inpatient appropriate because:Inpatient level of care appropriate due to severity of illness  Dispo: The patient is from: Home              Anticipated d/c is to: Home              Patient currently is not medically stable to d/c.   Difficult to place patient No        I/O last 3 completed shifts: In: 1680 [P.O.:1680] Out: 58 [Urine:3200] Total I/O In: 240 [P.O.:240] Out: 200 [Urine:200]     Consultants:  Cardiology  Procedures: None  Antimicrobials: None  Subjective: He is still on 6 L oxygen.  Short of breath with exertion.  Patient still coughing up significant amount of mucus, the mucus looks very dark, almost like chocolate in color.  No fever or chills. No chest pain or palpitation. No dysuria hematuria. No headaches or dizziness.   Objective: Vitals:   02/13/21 0500 02/13/21 0739 02/13/21 0829 02/13/21 1136  BP:  (!) 118/51  109/61  Pulse:  72  73  Resp:  18  18  Temp:  97.8 F (36.6 C)  98.3 F (36.8 C)  TempSrc:      SpO2:  94% 92% 91%  Weight: 90.3 kg       Intake/Output Summary (Last 24 hours) at 02/13/2021 1230 Last data filed at 02/13/2021 1100 Gross per 24 hour  Intake 1440 ml  Output 2050 ml   Net -610 ml   Filed Weights   02/11/21 0807 02/12/21 0328 02/13/21 0500  Weight: 92.9 kg 90.2 kg 90.3 kg    Examination:  General exam: Appears calm and comfortable  Respiratory system: Clear to auscultation. Respiratory effort normal. Cardiovascular system: S1 & S2 heard, RRR. No JVD, murmurs, rubs, gallops or clicks. No pedal edema. Gastrointestinal system: Abdomen is nondistended, soft and nontender. No organomegaly or masses felt. Normal bowel sounds heard. Central nervous system: Alert and oriented. No focal neurological deficits. Extremities: Symmetric 5 x 5 power. Skin: No rashes, lesions or ulcers Psychiatry: Judgement and insight appear normal. Mood & affect appropriate.     Data Reviewed: I have personally reviewed following labs and imaging studies  CBC: Recent Labs  Lab 02/09/21 0602 02/10/21 0325 02/11/21 0423 02/12/21 0540 02/13/21 0439  WBC 22.1* 21.4* 22.1* 21.9* 15.0*  NEUTROABS 15.1* 15.7* 14.7* 15.8* 10.6*  HGB 10.7* 10.5* 10.8* 11.5* 10.4*  HCT 33.4* 33.0* 34.2* 36.5* 33.4*  MCV 93.3 95.1 94.7 94.1 96.3  PLT 312 289 285 249 676   Basic Metabolic Panel: Recent Labs  Lab 02/08/21 0444 02/09/21 0602 02/10/21 0325 02/11/21 0423 02/12/21 0540 02/13/21 0439  NA 139 137 139 135 139  --   K 4.7  4.7 4.8 4.0 4.1  --   CL 97* 93* 96* 92* 93*  --   CO2 39* 35* 41* 39* 41*  --   GLUCOSE 129* 141* 116* 93 162*  --   BUN 49* 42* 41* 49* 40*  --   CREATININE 0.81 0.84 0.86 0.93 0.82  --   CALCIUM 8.6* 8.8* 8.6* 8.4* 8.6*  --   MG 2.3 2.3 2.1 2.2 2.3 2.3  PHOS 3.5 4.5 5.1* 4.2 3.3 3.7   GFR: Estimated Creatinine Clearance: 80.7 mL/min (by C-G formula based on SCr of 0.82 mg/dL). Liver Function Tests: No results for input(s): AST, ALT, ALKPHOS, BILITOT, PROT, ALBUMIN in the last 168 hours. No results for input(s): LIPASE, AMYLASE in the last 168 hours. No results for input(s): AMMONIA in the last 168 hours. Coagulation Profile: No results for  input(s): INR, PROTIME in the last 168 hours. Cardiac Enzymes: No results for input(s): CKTOTAL, CKMB, CKMBINDEX, TROPONINI in the last 168 hours. BNP (last 3 results) No results for input(s): PROBNP in the last 8760 hours. HbA1C: No results for input(s): HGBA1C in the last 72 hours. CBG: Recent Labs  Lab 02/12/21 1952 02/12/21 2335 02/13/21 0446 02/13/21 0741 02/13/21 1139  GLUCAP 226* 111* 133* 154* 208*   Lipid Profile: No results for input(s): CHOL, HDL, LDLCALC, TRIG, CHOLHDL, LDLDIRECT in the last 72 hours. Thyroid Function Tests: No results for input(s): TSH, T4TOTAL, FREET4, T3FREE, THYROIDAB in the last 72 hours. Anemia Panel: No results for input(s): VITAMINB12, FOLATE, FERRITIN, TIBC, IRON, RETICCTPCT in the last 72 hours. Sepsis Labs: Recent Labs  Lab 02/10/21 0325  PROCALCITON <0.10    Recent Results (from the past 240 hour(s))  Expectorated Sputum Assessment w Gram Stain, Rflx to Resp Cult     Status: None   Collection Time: 02/05/21 11:59 PM   Specimen: Sputum  Result Value Ref Range Status   Specimen Description SPUTUM  Final   Special Requests NONE  Final   Sputum evaluation   Final    THIS SPECIMEN IS ACCEPTABLE FOR SPUTUM CULTURE Performed at [facility], [address] [place], [place] [address]    Report Status 02/06/2021 FINAL  Final  Culture, Respiratory w Gram Stain     Status: None   Collection Time: 02/05/21 11:59 PM   Specimen: SPU  Result Value Ref Range Status   Specimen Description   Final    SPUTUM Performed at [facility], [address], [place], [place] [address]    Special Requests   Final    NONE Reflexed from [id number] Performed at [facility], [address] [place], [place] [address]    Gram Stain   Final    NO WBC SEEN RARE BUDDING YEAST SEEN Performed at [facility], [place] [address], [place],  [address]    Culture   Final    ABUNDANT STENOTROPHOMONAS MALTOPHILIA RARE  CANDIDA ALBICANS    Report Status 02/08/2021 FINAL  Final   Organism ID, Bacteria STENOTROPHOMONAS MALTOPHILIA  Final      Susceptibility   Stenotrophomonas maltophilia - MIC*    LEVOFLOXACIN 2 SENSITIVE Sensitive     TRIMETH/SULFA <=20 SENSITIVE Sensitive     * ABUNDANT STENOTROPHOMONAS MALTOPHILIA         Radiology Studies: No results found.      Scheduled Meds:  acetylcysteine  2 mL Nebulization TID   arformoterol  15 mcg Nebulization BID   vitamin C  500 mg Oral Daily  atorvastatin  40 mg Oral Daily   budesonide (PULMICORT) nebulizer solution  0.5 mg Nebulization BID   Chlorhexidine Gluconate Cloth  6 each Topical Q0600   fluticasone  1 spray Each Nare BID   furosemide  40 mg Oral Daily   gabapentin  300 mg Oral TID   insulin aspart  0-20 Units Subcutaneous Q4H   melatonin  5 mg Oral QHS   [START ON 02/14/2021] metoprolol succinate  25 mg Oral Daily   predniSONE  20 mg Oral Q breakfast   revefenacin  175 mcg Nebulization Daily   sodium chloride flush  3 mL Intravenous Q12H   traZODone  100 mg Oral QHS   Continuous Infusions:  sodium chloride       LOS: 20 days    Time spent: 32 minutes    Sharen Hones, MD [facility]   To contact the attending provider between 7A-7P or the covering provider during after hours 7P-7A, please log into the web site [email or web address] and access using universal [place] password for that web site. If you do not have the password, please call the hospital operator.  02/13/2021, 12:30 PM

## 2021-02-13 NOTE — Care Management Important Message (Signed)
Important Message  Patient Details  Name: Julian Sparks MRN: [id number] Date of Birth: 03-07-1946   Medicare Important Message Given:  Yes     Dannette Barbara 02/13/2021, 11:48 AM

## 2021-02-13 NOTE — TOC Progression Note (Addendum)
Transition of Care [facility]) - Progression Note    Patient Details  Name: Julian Sparks MRN: [id number] Date of Birth: 06-Sep-1945  Transition of Care [facility]) CM/SW Contact  Julian Sessions, RN Phone Number: 02/13/2021, 3:53 PM  Clinical Narrative:     Followed up with Daughter Julian Sparks.  She states that the patient is declining SNF, but would be agreeable to home health.  Per Julian Sparks, between the children and grandchildren there is someone checking on the patient in the home daily  Confirmed with patient he is declining SNF.  States he does not have a preference of home health agency. Referral made to [place] with [facility]. She is checking to see if they cover the area in which the patient lives and will follow up   Patient currently on 4l O2.  Per daughter patient normally wears 2 L at home  Update:  referral accepted by [place] with [facility]   Expected Discharge Plan: Skilled Nursing Facility Barriers to Discharge: Continued Medical Work up, SNF Pending discharge summary, SNF Pending discharge orders, SNF Pending bed offer  Expected Discharge Plan and Services Expected Discharge Plan: [place] In-house Referral: Clinical Social Work   Post Acute Care Choice: [place] Living arrangements for the past 2 months: Single Family Home                                       Social Determinants of Health (SDOH) Interventions    Readmission Risk Interventions No flowsheet data found.

## 2021-02-13 NOTE — Progress Notes (Addendum)
Progress Note  Patient Name: Julian Sparks Date of Encounter: 02/13/2021  Primary Cardiologist: Ida Rogue, MD  Subjective   Breathing stable this AM.  Still requiring O2 @ 6lpm.  Walked some w/ PT yesterday and desaturated on <6 lpm.  Still coughing up thick sputum - brown/brownish-red in color.  Denies dyspnea @ rest.  Inpatient Medications    Scheduled Meds:  acetylcysteine  2 mL Nebulization TID   arformoterol  15 mcg Nebulization BID   vitamin C  500 mg Oral Daily   atorvastatin  40 mg Oral Daily   budesonide (PULMICORT) nebulizer solution  0.5 mg Nebulization BID   Chlorhexidine Gluconate Cloth  6 each Topical Q0600   fluticasone  1 spray Each Nare BID   furosemide  40 mg Oral Daily   gabapentin  300 mg Oral TID   insulin aspart  0-20 Units Subcutaneous Q4H   melatonin  5 mg Oral QHS   metoprolol succinate  12.5 mg Oral Daily   predniSONE  20 mg Oral Q breakfast   revefenacin  175 mcg Nebulization Daily   sodium chloride flush  3 mL Intravenous Q12H   traZODone  100 mg Oral QHS   Continuous Infusions:  sodium chloride     PRN Meds: sodium chloride, acetaminophen, albuterol, clonazepam, cyclobenzaprine, guaiFENesin, ondansetron (ZOFRAN) IV, sodium chloride flush   Vital Signs    Vitals:   02/12/21 1639 02/12/21 1950 02/12/21 2006 02/13/21 0500  BP: (!) 132/52 125/80    Pulse: 70 72    Resp: 18 18    Temp: 98 F (36.7 C) 98 F (36.7 C)    TempSrc:  Oral    SpO2: 91% 96% 96%   Weight:    90.3 kg    Intake/Output Summary (Last 24 hours) at 02/13/2021 0734 Last data filed at 02/12/2021 2234 Gross per 24 hour  Intake 1440 ml  Output 2150 ml  Net -710 ml   Filed Weights   02/11/21 0807 02/12/21 0328 02/13/21 0500  Weight: 92.9 kg 90.2 kg 90.3 kg    Physical Exam   GEN: Well nourished, well developed, in no acute distress.  HEENT: Grossly normal.  Neck: Supple, no JVD, carotid bruits, or masses. Cardiac: RRR, distant, no murmurs, rubs, or  gallops. No clubbing, cyanosis, trace bilat ankle edema.  Radials 2+, DP/PT 1+ and equal bilaterally.  Respiratory:  Respirations regular and unlabored, diminished/coarse breath sounds bilat. GI: Soft, nontender, nondistended, BS + x 4. MS: no deformity or atrophy. Skin: warm and dry, no rash. Neuro:  Strength and sensation are intact. Psych: AAOx3.  Normal affect.  Labs    Chemistry Recent Labs  Lab 02/10/21 0325 02/11/21 0423 02/12/21 0540  NA 139 135 139  K 4.8 4.0 4.1  CL 96* 92* 93*  CO2 41* 39* 41*  GLUCOSE 116* 93 162*  BUN 41* 49* 40*  CREATININE 0.86 0.93 0.82  CALCIUM 8.6* 8.4* 8.6*  GFRNONAA >60 >60 >60  ANIONGAP 2* 4* 5     Hematology Recent Labs  Lab 02/11/21 0423 02/12/21 0540 02/13/21 0439  WBC 22.1* 21.9* 15.0*  RBC 3.61* 3.88* 3.47*  HGB 10.8* 11.5* 10.4*  HCT 34.2* 36.5* 33.4*  MCV 94.7 94.1 96.3  MCH 29.9 29.6 30.0  MCHC 31.6 31.5 31.1  RDW 16.1* 16.2* 16.0*  PLT 285 249 209    Cardiac Enzymes  Recent Labs  Lab 01/24/21 1132  TROPONINIHS 13      BNP Recent Labs  Lab 02/12/21  0540  BNP 75.4    Lipids  Lab Results  Component Value Date   CHOL 101 01/26/2021   HDL 46 01/26/2021   LDLCALC 36 01/26/2021   TRIG 97 01/26/2021   CHOLHDL 2.2 01/26/2021    HbA1c  Lab Results  Component Value Date   HGBA1C 6.8 (H) 01/24/2021    Radiology    DG Chest Port 1 View  Result Date: 02/10/2021 CLINICAL DATA:  Shortness of breath EXAM: PORTABLE CHEST 1 VIEW COMPARISON:  February 06, 2021 chest radiograph and chest CT February 03, 2021 FINDINGS: There is underlying fibrotic change, more notable on the right than on the left. There has been partial but incomplete clearing of underlying airspace opacity on the right, particularly in the right mid lung. There remains patchy airspace opacity superimposed on fibrosis on the right. There is stable left midlung atelectasis. There is also left base atelectasis. No new opacities evident. Heart is mildly  enlarged with pulmonary vascular normal. No adenopathy. There is aortic atherosclerosis. No bone lesions. IMPRESSION: Partial but incomplete clearing of airspace opacity/infiltrate on the right. Areas of atelectatic change on the left are stable. Underlying fibrotic changes, more severe on the right left, stable. Stable cardiac enlargement. Aortic Atherosclerosis (ICD10-I70.0). Electronically Signed   By: Lowella Grip III M.D.   On: 02/10/2021 11:05    Telemetry    RSR, freq PACs, run of atrial tachycardia @ ~ 100 bpm between 0510 am and 0610 am. Atach broke after PVC @ 0610 am - Personally Reviewed  Cardiac Studies   2D Echocardiogram 5.31.2022    1. Left ventricular ejection fraction, by estimation, is 50 to 55%. The  left ventricle has low normal function. Hypokinesis of septal wall. Left  ventricular diastolic parameters are consistent with Grade II diastolic  dysfunction (pseudonormalization).   2. Right ventricular systolic function is moderately reduced. The right  ventricular size is severely enlarged. Tricuspid regurgitation signal is  inadequate for assessing PA pressure.   3. Right atrial size was mildly dilated.   Patient Profile     75 y.o. male with history of HFpEF, COPD, hypoxic respiratory failure presenting with shortness of breath, diagnosed with hypoxia, PNA, and hypercapnic respiratory failure.  Being seen for HFpEF.  Assessment & Plan    1.  Acute on chronic hypoxic and hypercapnic respiratory failure/pneumonia: Multifactorial in the setting of COPD, S agalactiae PNA, post-COVID pneumonitis/fibrosis, and HFpEF.  Remains on oxygen at 6 L via nasal cannula.  Was previously on 2 L/min at home.  Walked w/ PT yesterday w/ desaturations requiring resumption of 6lpm.  Continues to cough thick, brown/reddish brown secretions.  No dyspnea @ rest.  SNF recommended but he plans to go home.  Steroids and nebulizers per internal medicine.  He has completed a course of  antibiotics.  Repeat sputum culture given ongoing secretions.  Volume mgmt as per below.  2.  Acute on chronic heart failure with preserved ejection fraction/pulmonary arterial hypertension: EF 50-55% by echocardiogram this admission grade 2 diastolic dysfunction.  Septal hypokinesis and severe RV enlargement also noted.  Looked dry yesterday and lasix reduced to 40mg  daily.  Minus 770 ml yesterday.  Wt pending this AM, but has been trending @ 90kg, which is lowest in 4-5 yrs.  Trace ankle edema on exam this AM, but sat much of the day yesterday.  Cont current dose of lasix.  Titrating ? blocker in setting of run of atrial tach this AM.  BP stable.    3.  PAT: ~1 hr episode of atrial tachycardia this AM between 0510 and 0610.  Rate just over 100.  Asymptomatic.  Rhythm broke to sinus rhythm following PVC  4.  HL:  LDL 36 on statin rx.  5.  Type 2 diabetes mellitus: A1c 6.8.  Insulin management per internal medicine.  Consider adding SGLT2 inhibitor if not cost prohibitive.  6.  Lymphoma/abdominal adenopathy: Seen by oncology with plan for outpatient follow-up.   Signed, Murray Hodgkins, NP  02/13/2021, 7:34 AM    For questions or updates, please contact   Please consult [email or web address] for contact info under Cardiology/STEMI.

## 2021-02-13 NOTE — Progress Notes (Signed)
Physical Therapy Treatment Patient Details Name: Julian Sparks MRN: [id number] DOB: [birth year] Today's Date: 02/13/2021    History of Present Illness presented to ER secondary to progressive SOB, LE and abdominal edema; admitted for management of acute/chronic hypoxic, hypercapnic respiratory failure due to CHF/COPD.    Julian Sparks Comments    Julian Sparks seen for Julian Sparks tx with Julian Sparks agreeable to tx. Julian Sparks is able to complete sit<>stand with CGA & supervision with RW. Julian Sparks is able to ambulate 60 ft + 40 ft with RW & close supervision except min assist on one occasion to correct LOB with turning. Julian Sparks instructed to take 1 seated rest break 2/2 SpO2 readings. Julian Sparks does endorse fatigue at end of session but feeling good overall.  Julian Sparks on 4L/min via nasal cannula at beginning of session. SpO2 87% after ambulating to door so turned up to 6L/min. Julian Sparks maintaining SpO2 >90% until he ambulated into hallway when reading was 71% so Julian Sparks took seated rest break then SpO2 reading >90% within seconds. Once Julian Sparks returned to recliner Julian Sparks placed on 4L/min but 88-89% so increased to 5L & SpO2 91-92%. Nurse made aware of O2 during session.     Follow Up Recommendations  SNF     Equipment Recommendations  None recommended by Julian Sparks    Recommendations for Other Services       Precautions / Restrictions Precautions Precautions: Fall Restrictions Weight Bearing Restrictions: No    Mobility  Bed Mobility                    Transfers Overall transfer level: Needs assistance Equipment used: Rolling walker (2 wheeled) Transfers: Sit to/from Stand Sit to Stand: Min guard            Ambulation/Gait Ambulation/Gait assistance: Min guard;Supervision Gait Distance (Feet): 60 Feet (+ 40 ft) Assistive device: Rolling walker (2 wheeled) Gait Pattern/deviations: Decreased step length - left;Decreased step length - right;Decreased stride length Gait velocity: slightly decreased.       Stairs             Wheelchair Mobility     Modified Rankin (Stroke Patients Only)       Balance Overall balance assessment: Needs assistance Sitting-balance support: Feet supported;No upper extremity supported Sitting balance-Leahy Scale: Good     Standing balance support: Bilateral upper extremity supported Standing balance-Leahy Scale: Fair                              Cognition Arousal/Alertness: Awake/alert Behavior During Therapy: WFL for tasks assessed/performed Overall Cognitive Status: Within Functional Limits for tasks assessed                                 General Comments: Pleasant gentleman      Exercises      General Comments        Pertinent Vitals/Pain Pain Assessment: No/denies pain    Home Living                      Prior Function            Julian Sparks Goals (current goals can now be found in the care plan section) Acute Rehab Julian Sparks Goals Patient Stated Goal: to get stronger and get back home! does not want to go to rehab Julian Sparks Goal Formulation: With patient Time For Goal Achievement: 02/15/21 Potential to Achieve Goals:  Good Progress towards Julian Sparks goals: Progressing toward goals    Frequency    Min 2X/week      Julian Sparks Plan Current plan remains appropriate    Co-evaluation              AM-PAC Julian Sparks "6 Clicks" Mobility   Outcome Measure  Help needed turning from your back to your side while in a flat bed without using bedrails?: None Help needed moving from lying on your back to sitting on the side of a flat bed without using bedrails?: A Little Help needed moving to and from a bed to a chair (including a wheelchair)?: A Little Help needed standing up from a chair using your arms (e.g., wheelchair or bedside chair)?: A Little Help needed to walk in hospital room?: A Little Help needed climbing 3-5 steps with a railing? : A Lot 6 Click Score: 18    End of Session Equipment Utilized During Treatment: Oxygen Activity Tolerance: Patient limited by  fatigue Patient left: in bed;in chair;with call bell/phone within reach;with chair alarm set;with family/visitor present Nurse Communication:  (O2) Julian Sparks Visit Diagnosis: Difficulty in walking, not elsewhere classified (R26.2);Muscle weakness (generalized) (M62.81)     Time: 0272-5366 Julian Sparks Time Calculation (min) (ACUTE ONLY): 13 min  Charges:  $Therapeutic Activity: 8-22 mins                     Julian Sparks, Julian Sparks, Julian Sparks 02/13/21, 4:08 PM    Julian Sparks 02/13/2021, 4:06 PM

## 2021-02-13 NOTE — Progress Notes (Addendum)
   Heart Failure Nurse Navigator Note  Met with patient today, he is sitting up in the chair at bedside.  States that this is first day he has felt like he can take a deep  full breath.  Discussed taking care of himself once he gets home.  He states his daughter has made him a chart for tracking his weights.  Re-enforced fluid and sodium restrictions.  States he would like to get his weight down to 180#.  He had no further questions at this time.  Pricilla Riffle RN CHFN

## 2021-02-14 ENCOUNTER — Ambulatory Visit (INDEPENDENT_AMBULATORY_CARE_PROVIDER_SITE_OTHER): Payer: Medicare HMO | Admitting: Nurse Practitioner

## 2021-02-14 ENCOUNTER — Inpatient Hospital Stay: Payer: Medicare HMO

## 2021-02-14 DIAGNOSIS — G9341 Metabolic encephalopathy: Secondary | ICD-10-CM | POA: Diagnosis not present

## 2021-02-14 DIAGNOSIS — I5033 Acute on chronic diastolic (congestive) heart failure: Secondary | ICD-10-CM | POA: Diagnosis not present

## 2021-02-14 DIAGNOSIS — J9621 Acute and chronic respiratory failure with hypoxia: Secondary | ICD-10-CM | POA: Diagnosis not present

## 2021-02-14 DIAGNOSIS — J9622 Acute and chronic respiratory failure with hypercapnia: Secondary | ICD-10-CM | POA: Diagnosis not present

## 2021-02-14 LAB — CBC WITH DIFFERENTIAL/PLATELET
Abs Immature Granulocytes: 0.2 10*3/uL — ABNORMAL HIGH (ref 0.00–0.07)
Basophils Absolute: 0 10*3/uL (ref 0.0–0.1)
Basophils Relative: 0 %
Eosinophils Absolute: 0.3 10*3/uL (ref 0.0–0.5)
Eosinophils Relative: 2 %
HCT: 31.2 % — ABNORMAL LOW (ref 39.0–52.0)
Hemoglobin: 9.7 g/dL — ABNORMAL LOW (ref 13.0–17.0)
Immature Granulocytes: 1 %
Lymphocytes Relative: 20 %
Lymphs Abs: 2.8 10*3/uL (ref 0.7–4.0)
MCH: 29.9 pg (ref 26.0–34.0)
MCHC: 31.1 g/dL (ref 30.0–36.0)
MCV: 96.3 fL (ref 80.0–100.0)
Monocytes Absolute: 0.8 10*3/uL (ref 0.1–1.0)
Monocytes Relative: 6 %
Neutro Abs: 10.1 10*3/uL — ABNORMAL HIGH (ref 1.7–7.7)
Neutrophils Relative %: 71 %
Platelets: 181 10*3/uL (ref 150–400)
RBC: 3.24 MIL/uL — ABNORMAL LOW (ref 4.22–5.81)
RDW: 15.9 % — ABNORMAL HIGH (ref 11.5–15.5)
WBC: 14.2 10*3/uL — ABNORMAL HIGH (ref 4.0–10.5)
nRBC: 0 % (ref 0.0–0.2)

## 2021-02-14 LAB — BASIC METABOLIC PANEL
Anion gap: 6 (ref 5–15)
BUN: 34 mg/dL — ABNORMAL HIGH (ref 8–23)
CO2: 38 mmol/L — ABNORMAL HIGH (ref 22–32)
Calcium: 8.2 mg/dL — ABNORMAL LOW (ref 8.9–10.3)
Chloride: 95 mmol/L — ABNORMAL LOW (ref 98–111)
Creatinine, Ser: 0.73 mg/dL (ref 0.61–1.24)
GFR, Estimated: >60 mL/min (ref >60)
Glucose, Bld: 128 mg/dL — ABNORMAL HIGH (ref 70–99)
Potassium: 3.7 mmol/L (ref 3.5–5.1)
Sodium: 139 mmol/L (ref 135–145)

## 2021-02-14 LAB — MAGNESIUM: Magnesium: 2.3 mg/dL (ref 1.7–2.4)

## 2021-02-14 LAB — GLUCOSE, CAPILLARY
Glucose-Capillary: 113 mg/dL — ABNORMAL HIGH (ref 70–99)
Glucose-Capillary: 133 mg/dL — ABNORMAL HIGH (ref 70–99)
Glucose-Capillary: 84 mg/dL (ref 70–99)

## 2021-02-14 MED ORDER — EMPAGLIFLOZIN 10 MG PO TABS: 10.0000 mg | ORAL_TABLET | Freq: Every day | ORAL | 0 refills | Status: DC

## 2021-02-14 MED ORDER — METOPROLOL SUCCINATE ER 25 MG PO TB24: 25.0000 mg | ORAL_TABLET | Freq: Every day | ORAL | 0 refills | Status: DC

## 2021-02-14 MED ORDER — GUAIFENESIN 100 MG/5ML PO SOLN: 5.0000 mL | ORAL | 0 refills | Status: DC | PRN

## 2021-02-14 MED ORDER — EMPAGLIFLOZIN 10 MG PO TABS
10.0000 mg | ORAL_TABLET | Freq: Every day | ORAL | Status: DC
  Filled 2021-02-14: qty 1

## 2021-02-14 NOTE — Evaluation (Signed)
Objective Swallowing Evaluation: Type of Study: Bedside Swallow Evaluation   Patient Details  Name: Julian Sparks MRN: [id number] Date of Birth: 1946/06/06  Today's Date: 02/14/2021 Time: SLP Start Time (ACUTE ONLY): 0840 -SLP Stop Time (ACUTE ONLY): 0940  SLP Time Calculation (min) (ACUTE ONLY): 60 min   Past Medical History:  Past Medical History:  Diagnosis Date   Chronic bronchitis (HCC)    COPD (chronic obstructive pulmonary disease) ([place])    Diabetes mellitus without complication ([place])    Diastolic dysfunction    Dyspnea    with exertion   Hypertension    Past Surgical History:  Past Surgical History:  Procedure Laterality Date   CIRCUMCISION     SHOULDER ARTHROSCOPY WITH OPEN ROTATOR CUFF REPAIR Left 06/20/2016   Procedure: SHOULDER ARTHROSCOPY WITH OPEN ROTATOR CUFF REPAIR;  Surgeon: Earnestine Leys, MD;  Location: [facility] ORS;  Service: Orthopedics;  Laterality: Left;   UPPER GI ENDOSCOPY     HPI: Pt is a 75 yo male who presented to ER secondary to progressive SOB, LE and abdominal edema; admitted for management of acute/chronic hypoxic, hypercapnic respiratory failure due to CHF/COPD.  Current medical history significant for cellulitis, COPD with chronic respiratory failure on 2 L of oxygen, diabetes mellitus, hypertension and chronic venous stasis. Followed by Cardiology; [place] per chart. Pt received BiPAP and Lasix initially d/t fluid overload per chart.  CXR at admit: cardiomegaly with bilateral interstitial prominence most consistent with CHF. Recent CXR on 6/17: Partial but incomplete clearing of airspace opacity/infiltrate on  the right. Areas of atelectatic change on the left are stable.  Underlying fibrotic changes, more severe on the right left, stable. Stable cardiac enlargement.   Subjective: pt awake, alert. verbally engaged w/ staff and followed instructions. Noted min dry cough and WOB during exertion and at rest PRIOR to po's    Assessment / Plan /  Recommendation  CHL IP CLINICAL IMPRESSIONS 02/14/2021  Clinical Impression Pt appears to present w/ functional oropharyngeal phase swallowing function w/ No oropharyngeal phase dysphagia noted during this evaluation. Pt exhibits no neuromuscular deficits of swallowing. However, pt is easily SOB w/ WOB w/ any exertion including talking and moving to chair. ANY increased WOB can impact the timing of the pharyngeal swallow initiation as well as the apnea moment during swallowing. No aspiration was noted during this study today. There was noted to be trace-slight laryngeal penetration w/ thin liquids occurring x2/10 trials; residue appeared to clear w/ completion of swallowing and no buildup of residue occurred. During the oral phase, timely bolus management and control of bolus propulsion for A-P transfer occurred. Full oral clearing achieved w/ all trial consistencies. During the pharyngeal phase, fairly timely pharyngeal swallow initiation noted w/ trial consistencies. No aspiration occurred; airway closure appeared timely, tight when pt attended to taking small, single sips slowly -- not rushing or taking large sips. Pt was respectful of his breathing and avoided becoming SOB during po trials by using single sips. It was noted that when pt took a larger sip back to back, trace-slight laryngeal penetration occurred which appeared to clear w/ completion of the swallow. No pharyngeal residue remained post swallow indicating adequate laryngeal excursion and pharyngeal pressure during the swallow.   Discussed results of MBSS and impact of WOB and SOB on swallowing -- pt agreed w/ eating/drinking slowly, general aspiration precautions, and taking rest breaks when needed. Video viewed and questions answered. Handouts given. Recommend continuing current diet as ordered. MD updated. Pt agreed.  SLP Visit  Diagnosis Dysphagia, unspecified (R13.10)  Attention and concentration deficit following --  Frontal lobe and  executive function deficit following --  Impact on safety and function (No Data)      CHL IP TREATMENT RECOMMENDATION 02/14/2021  Treatment Recommendations No treatment recommended at this time     Prognosis 02/14/2021  Prognosis for Safe Diet Advancement Good  Barriers to Reach Goals (No Data)  Barriers/Prognosis Comment --    CHL IP DIET RECOMMENDATION 02/14/2021  SLP Diet Recommendations Regular solids;Thin liquid  Liquid Administration via Cup;No straw  Medication Administration Whole meds with puree  Compensations Minimize environmental distractions;Slow rate;Small sips/bites;Lingual sweep for clearance of pocketing;Follow solids with liquid  Postural Changes Remain semi-upright after after feeds/meals (Comment);Seated upright at 90 degrees      CHL IP OTHER RECOMMENDATIONS 02/14/2021  Recommended Consults (No Data)  Oral Care Recommendations Oral care BID;Patient independent with oral care  Other Recommendations (No Data)      CHL IP FOLLOW UP RECOMMENDATIONS 02/14/2021  Follow up Recommendations None      CHL IP FREQUENCY AND DURATION 02/14/2021  Speech Therapy Frequency (ACUTE ONLY) (No Data)  Treatment Duration (No Data)           CHL IP ORAL PHASE 02/14/2021  Oral Phase WFL  Oral - Pudding Teaspoon --  Oral - Pudding Cup --  Oral - Honey Teaspoon --  Oral - Honey Cup NT  Oral - Nectar Teaspoon --  Oral - Nectar Cup 1  Oral - Nectar Straw --  Oral - Thin Teaspoon --  Oral - Thin Cup 10  Oral - Thin Straw --  Oral - Puree 3  Oral - Mech Soft 2  Oral - Regular --  Oral - Multi-Consistency --  Oral - Pill --  Oral Phase - Comment --    CHL IP PHARYNGEAL PHASE 02/14/2021  Pharyngeal Phase WFL  Pharyngeal- Pudding Teaspoon --  Pharyngeal --  Pharyngeal- Pudding Cup --  Pharyngeal --  Pharyngeal- Honey Teaspoon --  Pharyngeal --  Pharyngeal- Honey Cup NT  Pharyngeal --  Pharyngeal- Nectar Teaspoon --  Pharyngeal --  Pharyngeal- Nectar Cup 1  Pharyngeal  --  Pharyngeal- Nectar Straw --  Pharyngeal --  Pharyngeal- Thin Teaspoon --  Pharyngeal --  Pharyngeal- Thin Cup 10  Pharyngeal --  Pharyngeal- Thin Straw --  Pharyngeal --  Pharyngeal- Puree 3  Pharyngeal --  Pharyngeal- Mechanical Soft 2  Pharyngeal --  Pharyngeal- Regular --  Pharyngeal --  Pharyngeal- Multi-consistency --  Pharyngeal --  Pharyngeal- Pill --  Pharyngeal --  Pharyngeal Comment --     CHL IP CERVICAL ESOPHAGEAL PHASE 02/14/2021  Cervical Esophageal Phase WFL  Pudding Teaspoon --  Pudding Cup --  Honey Teaspoon --  Honey Cup --  Nectar Teaspoon --  Nectar Cup --  Nectar Straw --  Thin Teaspoon --  Thin Cup --  Thin Straw --  Puree --  Mechanical Soft --  Regular --  Multi-consistency --  Pill --  Cervical Esophageal Comment --         Orinda Kenner, MS, CCC-SLP Speech Language Pathologist Rehab Services [phone] [facility] 02/14/2021, 11:05 AM

## 2021-02-14 NOTE — Progress Notes (Signed)
Progress Note  Patient Name: Julian Sparks Date of Encounter: 02/14/2021  Primary Cardiologist: Ida Rogue, MD  Subjective   Breathing stable.  Desaturated again yesterday when walking w/ PT.  He says that he's used to that and knows when he needs to rest.  He just wants to go home.  Denies any additional coughing/thick secretions.  Inpatient Medications    Scheduled Meds:  acetylcysteine  2 mL Nebulization TID   arformoterol  15 mcg Nebulization BID   vitamin C  500 mg Oral Daily   atorvastatin  40 mg Oral Daily   budesonide (PULMICORT) nebulizer solution  0.5 mg Nebulization BID   Chlorhexidine Gluconate Cloth  6 each Topical Q0600   fluticasone  1 spray Each Nare BID   furosemide  40 mg Oral Daily   gabapentin  300 mg Oral TID   insulin aspart  0-20 Units Subcutaneous Q4H   insulin glargine  10 Units Subcutaneous QHS   melatonin  5 mg Oral QHS   metoprolol succinate  25 mg Oral Daily   predniSONE  10 mg Oral Q breakfast   revefenacin  175 mcg Nebulization Daily   sodium chloride flush  3 mL Intravenous Q12H   traZODone  100 mg Oral QHS   Continuous Infusions:  sodium chloride     PRN Meds: sodium chloride, acetaminophen, albuterol, clonazepam, cyclobenzaprine, guaiFENesin, ondansetron (ZOFRAN) IV, sodium chloride flush   Vital Signs    Vitals:   02/13/21 2021 02/14/21 0356 02/14/21 0400 02/14/21 0758  BP:  127/60  112/60  Pulse:  70  68  Resp:  16  17  Temp:   97.7 F (36.5 C)   TempSrc:      SpO2: 92% (!) 89% 90% 90%  Weight:        Intake/Output Summary (Last 24 hours) at 02/14/2021 0850 Last data filed at 02/14/2021 0838 Gross per 24 hour  Intake 2240 ml  Output 2740 ml  Net -500 ml   Filed Weights   02/11/21 0807 02/12/21 0328 02/13/21 0500  Weight: 92.9 kg 90.2 kg 90.3 kg    Physical Exam   GEN: Well nourished, well developed, in no acute distress.  HEENT: Grossly normal.  Neck: Supple, no JVD, carotid bruits, or masses. Cardiac: RRR,  distant, no murmurs, rubs, or gallops. No clubbing, cyanosis, trace bilat lower ext edema.  Radials 2+, DP/PT 1+ and equal bilaterally.  Respiratory:  Respirations regular and unlabored, diminished breath sounds bilat w/ scatt rhonchi. GI: Soft, nontender, nondistended, BS + x 4. MS: no deformity or atrophy. Skin: warm and dry, no rash. Neuro:  Strength and sensation are intact. Psych: AAOx3.  Normal affect.  Labs    Chemistry Recent Labs  Lab 02/11/21 0423 02/12/21 0540 02/14/21 0427  NA 135 139 139  K 4.0 4.1 3.7  CL 92* 93* 95*  CO2 39* 41* 38*  GLUCOSE 93 162* 128*  BUN 49* 40* 34*  CREATININE 0.93 0.82 0.73  CALCIUM 8.4* 8.6* 8.2*  GFRNONAA >60 >60 >60  ANIONGAP 4* 5 6     Hematology Recent Labs  Lab 02/12/21 0540 02/13/21 0439 02/14/21 0427  WBC 21.9* 15.0* 14.2*  RBC 3.88* 3.47* 3.24*  HGB 11.5* 10.4* 9.7*  HCT 36.5* 33.4* 31.2*  MCV 94.1 96.3 96.3  MCH 29.6 30.0 29.9  MCHC 31.5 31.1 31.1  RDW 16.2* 16.0* 15.9*  PLT 249 209 181    Cardiac Enzymes  Recent Labs  Lab 01/24/21 1132  TROPONINIHS 13  BNP Recent Labs  Lab 02/12/21 0540  BNP 75.4    Lipids  Lab Results  Component Value Date   CHOL 101 01/26/2021   HDL 46 01/26/2021   LDLCALC 36 01/26/2021   TRIG 97 01/26/2021   CHOLHDL 2.2 01/26/2021    HbA1c  Lab Results  Component Value Date   HGBA1C 6.8 (H) 01/24/2021    Radiology    DG Chest Port 1 View  Result Date: 02/10/2021 CLINICAL DATA:  Shortness of breath EXAM: PORTABLE CHEST 1 VIEW COMPARISON:  February 06, 2021 chest radiograph and chest CT February 03, 2021 FINDINGS: There is underlying fibrotic change, more notable on the right than on the left. There has been partial but incomplete clearing of underlying airspace opacity on the right, particularly in the right mid lung. There remains patchy airspace opacity superimposed on fibrosis on the right. There is stable left midlung atelectasis. There is also left base atelectasis. No  new opacities evident. Heart is mildly enlarged with pulmonary vascular normal. No adenopathy. There is aortic atherosclerosis. No bone lesions. IMPRESSION: Partial but incomplete clearing of airspace opacity/infiltrate on the right. Areas of atelectatic change on the left are stable. Underlying fibrotic changes, more severe on the right left, stable. Stable cardiac enlargement. Aortic Atherosclerosis (ICD10-I70.0). Electronically Signed   By: Lowella Grip III M.D.   On: 02/10/2021 11:05    Telemetry    RSR, PVCs. No recurrent atrial tachycardia - Personally Reviewed  Cardiac Studies   2D Echocardiogram 5.31.2022    1. Left ventricular ejection fraction, by estimation, is 50 to 55%. The  left ventricle has low normal function. Hypokinesis of septal wall. Left  ventricular diastolic parameters are consistent with Grade II diastolic  dysfunction (pseudonormalization).   2. Right ventricular systolic function is moderately reduced. The right  ventricular size is severely enlarged. Tricuspid regurgitation signal is  inadequate for assessing PA pressure.   3. Right atrial size was mildly dilated.   Patient Profile     75 y.o. male with history of HFpEF, COPD, hypoxic respiratory failure presenting with shortness of breath, diagnosed with hypoxia, PNA, and hypercapnic respiratory failure.  Being seen for HFpEF.  Assessment & Plan    1.  Acute on chronic hypoxic and hypercapnic respiratory failure/pneumonia: Multifactorial in the setting of COPD, S agalactiae PNA, post-COVID pneumonitis/fibrosis, and HFpEF.  Remains on oxygen at 6 L via nasal cannula.  Was previously on 2 L/min at home.  Still desaturating w/ ambulation on 4-6 lpm.  Wants to go home - says he has all necessary equipment.  PT rec SNF, but he plans on going home w/ HHPT.  Cough improved this AM - denies any additional secretions.  Nebs/steroids per IM.  2.  Acute on chronic HFpEF/PAH:  EF 50-55% by echocardiogram this  admission grade 2 diastolic dysfunction.  Septal hypokinesis and severe RV enlargement also noted.  Volume appears stable w/ only trace ankle edema - likely 2/2 sitting all day.  He says this is chronic.  Minus 640 ml yesterday on lasix 40mg  daily. Wt pending this AM.  HR/BP stable.  Cont current meds.  3.  PAT:  no recurrence following titration of metoprolol to 25mg  daily yesterday.  4.  HL:  LDL 36 on statin rx.  5.  DMII:  A1c 6.8.  Insulin mgmt per IM.  Will add empagliflozin in setting of #2.  6.  6.  Lymphoma/abdominal adenopathy: Seen by oncology with plan for outpatient follow-up.  7.  Normocytic anemia:  H/H trending down. Denies bleeding/melena.  Likely 2/2 repeated blood draws.  Signed, Murray Hodgkins, NP  02/14/2021, 8:50 AM    For questions or updates, please contact   Please consult [email or web address] for contact info under Cardiology/STEMI.

## 2021-02-14 NOTE — Discharge Summary (Addendum)
Physician Discharge Summary  Patient ID: Julian Sparks MRN: [id number] DOB/AGE: 12/27/1945 75 y.o.  Admit date: 01/24/2021 Discharge date: 02/14/2021  Admission Diagnoses:  Discharge Diagnoses:  Principal Problem:   Acute on chronic diastolic CHF (congestive heart failure) ([place]) Active Problems:   Type 2 diabetes mellitus without complication (HCC)   Acute on chronic respiratory failure (HCC)   Hyperkalemia   Obesity (BMI 30-39.9)   Acute on chronic congestive heart failure (HCC)   Acute cor pulmonale (HCC)   Lymphoma, small lymphocytic (HCC)   Metabolic alkalosis   Acute metabolic encephalopathy   Paroxysmal atrial tachycardia ([place])   Discharged Condition: good  Hospital Course:  75 y.o. male with PMH as below presenting to the ED worsening shortness of breath from his baseline associated with bilateral lower extremity swelling as well as increased abdominal girth for about 2 days. Has generalized anasarca +hypoxia + chronic venous stasis with ulceration and was seen at the vascular clinic where he had on arrival placed bilaterally to both lower extremities. He was admitted to PCU with Severe ACUTE Hypoxic and Hypercapnic Respiratory Failure due to acute severe decompensated combined systloic and diastolic heart failure on BiPAP   5/31: Admitted for acute diastolic heart failure 6/1: Resp distress and mental status changes, placed on biPAP 6/2: Respiratory status improved, weaned off BiPAP to HFNC 50% FiO2 @ 60L 6/3: Overnight with agitation and unable to tolerate BiPAP with thick tan secretions, resulting in  hypoxia (O2 sats 60's) during coughing episode, transferred to [facility] ~ now weaned back to HFNC, add IV steroids for AECOPD 6/5: Tolerated BiPAP for 3 hours last night 6/6: Remains on HHFNC (currently at 87% FiO2),BMP concerning for Contraction Metabolic Alkalosis due to aggressive Diuresis with Lasix ~ will hold Lasix and give Diamox + Metolazone; Sputum culture with  Streptococcus Agalactiae ~ will start Ceftriaxone 6/7: Placed on BiPAP this morning due to SOB, CXR with new RUL alveolar infiltrate (placed on Rocephin yesterday and sputum cx with Streptococcus Agalactiae. Will decrease Diamox to 500 mg daily, continue Metolazone 2.5 mg daily 6/8: On BiPAP overnight (50% FiO2) and tolerating, plan for HHFNC during day, diuresing well and contraction alkalosis continues to improve with Diamox + metolazone 6/9: Ceftriaxone switched to Cefazolin, remains on HHFNC (currently @ 65% FiO2), diuresing well, Creatinine remains stable, foley catheter removed 6/10: CT chest concerning for R>L multifocal opacity (consistent with pnuemonia vs. PAH), along with progression of previous abdominal adenopathy (consistent with Lymphoma); Oncology consulted, Holding diuresis due to soft BP and slight increase in serum Creatinine, decrease steroids 02/06/21-patient remains on high flow nasal cannula with fragmented speech, I met with family at bedside including wife and daughter.  Patient still unable to lean back in supine position and we reviewed possibly needing cardiac cath in the future however he is very nervous about this and we explained that this will be done only when he is in chronic stable state.  He still has bilateral wheezing in all lung zones on exam today.  Has not had hemoptysis during my shift today. 02/07/21- patient still on HFNC, family at bedside. Reviewed CT with them and atelcetasis on rll will start Metaneb 02/08/21- patient is weaning down on FiO2 still on HFNC 02/09/21- patient weaned to bubbler 8L/min via [place].  Plan to optimize for TRH transfer.  #1.  Acute on chronic hypoxemic respiratory failure. Acute on chronic diastolic congestive heart failure. Essential hypertension. Patient euvolemic, on oral Lasix. Hypoxemia appears to be pulmonary related. Currently on 3L of  O2.  2.  Streptococcus Agalactiae pneumonia. Hemoptysis Lymphoma. He has completed  antibiotics.  He has very thick mucus production with hemoptysis.  He received Mucomyst.  His mucus is clear, condition had improved.  Is currently on 3 L oxygen, baseline on 2 L oxygen.  At this point he is medically stable to be discharged. I will discontinue IV Lasix as patient has no longer have any bronchospasm, currently on 10 mg of prednisone. Patient also had a modified barium swallow, did not show any aspiration.   #3.  Uncontrolled type 2 diabetes with hyperglycemia and hypoglycemia. Longer has any hyperglycemia after weaning off steroids.  #4.  Persistent leukocytosis. Patient procalcitonin level has normalized, pneumonia has improved.  Leukocytosis is also better.  Patient is evaluated by PT/OT, recommended SNF, but patient refused SNF, will DC home with PT/OT. Consults: cardiology and pulmonary/intensive care  Significant Diagnostic Studies:  Echo: 1. Left ventricular ejection fraction, by estimation, is 50 to 55%. The left ventricle has low normal function. Hypokinesis of septal wall. Left ventricular diastolic parameters are consistent with Grade II diastolic dysfunction (pseudonormalization). 2. Right ventricular systolic function is moderately reduced. The right ventricular size is severely enlarged. Tricuspid regurgitation signal is inadequate for assessing PA pressure. 3. Right atrial size was mildly dilated.  CT ABDOMEN AND PELVIS WITH CONTRAST   TECHNIQUE: Multidetector CT imaging of the abdomen and pelvis was performed using the standard protocol following bolus administration of intravenous contrast.   CONTRAST:  18m OMNIPAQUE IOHEXOL 300 MG/ML  SOLN   COMPARISON:  PET-CT 03/06/2019   FINDINGS: Lower chest: Consolidation posteriorly in the right lower lobe with airspace opacity posteriorly in the right middle lobe and hazy ground-glass opacities in the lingula. Confluent bandlike volume loss centrally in the left lower lobe extending to the lung  base, these findings are generally similar to the chest CT from this morning. Trace bilateral pleural effusions. Mild cardiomegaly. Right coronary artery and descending thoracic aortic atherosclerosis.   Hepatobiliary: 3 mm hypodense lesion in the left hepatic lobe on image 14 series 2 is technically too small to characterize although statistically likely to be benign. Gallbladder unremarkable. No biliary dilatation.   Pancreas: Unremarkable   Spleen: No splenomegaly or focal splenic lesion.   Adrenals/Urinary Tract: Small amount of gas in the urinary bladder, query recent catheterization. Otherwise unremarkable.   Stomach/Bowel: Descending and sigmoid colon diverticulosis.   Vascular/Lymphatic: Aortoiliac atherosclerotic vascular disease.   Compared to PET-CT of 03/06/2019 there is substantial increase in bulky adenopathy along the gastrohepatic ligament/celiac chain and in the retroperitoneum.   A celiac node measures 5.4 cm in short axis on image 25 series 2, previously 2.1 cm.   The retroperitoneal adenopathy now forms and encasing rind which displaces the aorta from the spine. The analogous portion of this rind to a previously 2.5 cm in short axis right retroperitoneal lymph node currently measures 4.7 cm. The conglomerate adenopathy in the periaortic region in total measures about 14.5 by 7.1 by 16.5 cm. This adenopathy extends down to the common iliac level but not into the lower pelvis.   Reproductive: The prostate gland measures 5.5 by 3.8 by 5.3 cm (volume = 58 cm^3), compatible with mild prostatomegaly.   Other: No supplemental non-categorized findings.   Musculoskeletal: Callus formation compatible with late phase healing of left seventh and eighth lateral rib fractures. Transitional S1 vertebra. Degenerative disc disease at L5-S1 thought to likely be causing bilateral foraminal impingement and central narrowing of the thecal sac. Disc bulge  and facet  arthropathy at L3-4 and L4-5 likely cause mild bilateral impingement at L4-5 and mild right foraminal impingement at L3-4.   Small umbilical hernia contains adipose tissue.   IMPRESSION: 1. Substantial increase in bulky celiac and retroperitoneal adenopathy compared to the 03/06/2019 PET-CT. Appearance compatible with limb for proliferative process. 2. Stable basilar airspace opacities, right greater than left, with stable trace bilateral pleural effusions. 3. Lower lumbar impingement due to spondylosis and degenerative disc disease. 4. Mild prostatomegaly. 5.  Aortic Atherosclerosis (ICD10-I70.0). 6. Descending and sigmoid colon diverticulosis. 7. Small umbilical hernia contains adipose tissue.     Electronically Signed   By: Van Clines M.D.   On: 02/03/2021 15:39   Treatments: Lasix, steroids, antibiotics  Discharge Exam: Blood pressure 112/60, pulse 68, temperature 97.7 F (36.5 C), resp. rate 17, weight 90.3 kg, SpO2 90 %. General appearance: alert and cooperative Resp: clear to auscultation bilaterally Cardio: regular rate and rhythm, S1, S2 normal, no murmur, click, rub or gallop GI: soft, non-tender; bowel sounds normal; no masses,  no organomegaly Extremities: extremities normal, atraumatic, no cyanosis or edema  Disposition: Discharge disposition: 01-Home or Self Care       Discharge Instructions     Diet - low sodium heart healthy   Complete by: As directed    Discharge wound care:   Complete by: As directed    Follow with RN   Increase activity slowly   Complete by: As directed       Allergies as of 02/14/2021       Reactions   Aspirin Nausea Only, Other (See Comments)   325 mg aspirin cause stomach upset   Penicillins Rash        Medication List     STOP taking these medications    glipiZIDE 5 MG 24 hr tablet Commonly known as: GLUCOTROL XL   levofloxacin 500 MG tablet Commonly known as: LEVAQUIN    losartan-hydrochlorothiazide 100-25 MG tablet Commonly known as: HYZAAR   methylPREDNISolone 4 MG Tbpk tablet Commonly known as: MEDROL DOSEPAK   vitamin C 500 MG tablet Commonly known as: ASCORBIC ACID       TAKE these medications    Accu-Chek Aviva Plus test strip Generic drug: glucose blood TEST BLOOD SUGAR TWICE DAILY   Accu-Chek Aviva Plus w/Device Kit Check blood sugar BID   Accu-Chek Softclix Lancets lancets Check blood sugar two times daily   albuterol 108 (90 Base) MCG/ACT inhaler Commonly known as: Ventolin HFA Inhale 2 puffs into the lungs as needed for wheezing or shortness of breath.   aspirin 81 MG EC tablet Take 81 mg by mouth daily. Swallow whole.   atorvastatin 40 MG tablet Commonly known as: LIPITOR TAKE 1 TABLET EVERY DAY   B-D SINGLE USE SWABS REGULAR Pads USE AS DIRECTED   budesonide-formoterol 160-4.5 MCG/ACT inhaler Commonly known as: Symbicort Inhale 2 puffs into the lungs 2 (two) times daily.   cyclobenzaprine 10 MG tablet Commonly known as: FLEXERIL Take 10 mg by mouth at bedtime as needed for muscle spasms.   empagliflozin 10 MG Tabs tablet Commonly known as: JARDIANCE Take 1 tablet (10 mg total) by mouth daily.   Fish Oil 1000 MG Caps Take 2 capsules by mouth 2 (two) times daily.   fluticasone 50 MCG/ACT nasal spray Commonly known as: FLONASE Place 1 spray into both nostrils 2 (two) times a day.   furosemide 20 MG tablet Commonly known as: LASIX TAKE 2 TABLETS EVERY DAY   gabapentin 300  MG capsule Commonly known as: NEURONTIN Take 1 capsule (300 mg total) by mouth 3 (three) times daily.   guaiFENesin 100 MG/5ML Soln Commonly known as: ROBITUSSIN Take 5 mLs (100 mg total) by mouth every 4 (four) hours as needed for cough or to loosen phlegm.   ipratropium-albuterol 0.5-2.5 (3) MG/3ML Soln Commonly known as: DUONEB Take 3 mLs by nebulization every 4 (four) hours as needed. What changed: reasons to take this    metFORMIN 500 MG tablet Commonly known as: GLUCOPHAGE TAKE 1 TABLET TWICE DAILY What changed: when to take this   metoprolol succinate 25 MG 24 hr tablet Commonly known as: TOPROL-XL Take 1 tablet (25 mg total) by mouth daily. Start taking on: February 15, 2021   SUPER B COMPLEX PO Take 1 tablet by mouth daily.   traZODone 50 MG tablet Commonly known as: DESYREL TAKE 2 TABLETS EVERY DAY What changed: when to take this               Discharge Care Instructions  (From admission, onward)           Start     Ordered   02/14/21 0000  Discharge wound care:       Comments: Follow with RN   02/14/21 1059            Follow-up Information     [name], MD Follow up in 1 week(s).   Specialties: Internal Medicine, Cardiology Contact information: [place] [address] [phone]         Minna Merritts, MD Follow up in 2 week(s).   Specialty: Cardiology Contact information: [place] [address] [phone]         Tyler Pita, MD Follow up in 2 week(s).   Specialty: Pulmonary Disease Contact information: [place] [address] [phone]                35 minutes Signed: Sharen Hones 02/14/2021, 11:02 AM

## 2021-02-16 DIAGNOSIS — I5033 Acute on chronic diastolic (congestive) heart failure: Secondary | ICD-10-CM | POA: Diagnosis not present

## 2021-02-16 DIAGNOSIS — I5021 Acute systolic (congestive) heart failure: Secondary | ICD-10-CM | POA: Diagnosis not present

## 2021-02-16 DIAGNOSIS — I872 Venous insufficiency (chronic) (peripheral): Secondary | ICD-10-CM | POA: Diagnosis not present

## 2021-02-16 DIAGNOSIS — E261 Secondary hyperaldosteronism: Secondary | ICD-10-CM | POA: Diagnosis not present

## 2021-02-16 DIAGNOSIS — I471 Supraventricular tachycardia: Secondary | ICD-10-CM | POA: Diagnosis not present

## 2021-02-16 DIAGNOSIS — I5030 Unspecified diastolic (congestive) heart failure: Secondary | ICD-10-CM | POA: Diagnosis not present

## 2021-02-16 DIAGNOSIS — J441 Chronic obstructive pulmonary disease with (acute) exacerbation: Secondary | ICD-10-CM | POA: Diagnosis not present

## 2021-02-16 DIAGNOSIS — L97929 Non-pressure chronic ulcer of unspecified part of left lower leg with unspecified severity: Secondary | ICD-10-CM | POA: Diagnosis not present

## 2021-02-16 DIAGNOSIS — I2609 Other pulmonary embolism with acute cor pulmonale: Secondary | ICD-10-CM | POA: Diagnosis not present

## 2021-02-16 DIAGNOSIS — L97919 Non-pressure chronic ulcer of unspecified part of right lower leg with unspecified severity: Secondary | ICD-10-CM | POA: Diagnosis not present

## 2021-02-16 DIAGNOSIS — I110 Hypertensive heart disease with heart failure: Secondary | ICD-10-CM | POA: Diagnosis not present

## 2021-02-20 DIAGNOSIS — L97919 Non-pressure chronic ulcer of unspecified part of right lower leg with unspecified severity: Secondary | ICD-10-CM | POA: Diagnosis not present

## 2021-02-20 DIAGNOSIS — I5021 Acute systolic (congestive) heart failure: Secondary | ICD-10-CM | POA: Diagnosis not present

## 2021-02-20 DIAGNOSIS — L97929 Non-pressure chronic ulcer of unspecified part of left lower leg with unspecified severity: Secondary | ICD-10-CM | POA: Diagnosis not present

## 2021-02-20 DIAGNOSIS — I872 Venous insufficiency (chronic) (peripheral): Secondary | ICD-10-CM | POA: Diagnosis not present

## 2021-02-20 DIAGNOSIS — I5033 Acute on chronic diastolic (congestive) heart failure: Secondary | ICD-10-CM | POA: Diagnosis not present

## 2021-02-20 DIAGNOSIS — I471 Supraventricular tachycardia: Secondary | ICD-10-CM | POA: Diagnosis not present

## 2021-02-20 DIAGNOSIS — I110 Hypertensive heart disease with heart failure: Secondary | ICD-10-CM | POA: Diagnosis not present

## 2021-02-20 DIAGNOSIS — I2609 Other pulmonary embolism with acute cor pulmonale: Secondary | ICD-10-CM | POA: Diagnosis not present

## 2021-02-20 DIAGNOSIS — J441 Chronic obstructive pulmonary disease with (acute) exacerbation: Secondary | ICD-10-CM | POA: Diagnosis not present

## 2021-02-22 NOTE — Progress Notes (Signed)
Patient ID: Julian Sparks, male    DOB: 06-13-1946, 75 y.o.   MRN: [id number]  HPI  Julian Sparks is a 75 y/o male with a history of DM, HTN, COPD, previous tobacco use and chronic heart failure.   Echo report from 01/24/21 reviewed and showed an EF of 50-55% with severe RAE but without LVH.   Admitted 01/24/21 due to acute heart failure. Placed on bipap due to worsening SOB. Given steroids due to COPD. Cardiology, wound and med onc consults obtained. Aggressively diuresed. Antibiotics given for strep in his sputum. Eventually transitioned to nasal cannula oxygen. Modified barium swallow was negative. PT/OT evaluation done. SNF recommended but he refused. Discharged after 21 days.   He presents today for his initial visit with a chief complaint of moderate shortness of breath with little exertion. He describes this as chronic in nature having been present for several years. He has associated fatigue, cough (producing thick sputum), wheezing, easy bruising and left foot pain along with this. He denies any dizziness, abdominal distention, palpitations, pedal edema, chest pain or weight gain.   Arrived with his portable oxygen tank on 4L but initial pulse ox was 63% and he said that he's been having to turn it off and then back on for it to work correctly. Placed on 4L on our tank and he quickly rose to 93%. He says that he has to call the company because there must be something wrong with his portable tank.   Using his nebulizer 4 times/ day. Continues to produce thick brownish sputum.   Past Medical History:  Diagnosis Date   CHF (congestive heart failure) (HCC)    Chronic bronchitis (HCC)    COPD (chronic obstructive pulmonary disease) (HCC)    Diabetes mellitus without complication (HCC)    Diastolic dysfunction    Dyspnea    with exertion   Hypertension    Past Surgical History:  Procedure Laterality Date   CIRCUMCISION     SHOULDER ARTHROSCOPY WITH OPEN ROTATOR CUFF REPAIR Left 06/20/2016    Procedure: SHOULDER ARTHROSCOPY WITH OPEN ROTATOR CUFF REPAIR;  Surgeon: Earnestine Leys, MD;  Location: [facility] ORS;  Service: Orthopedics;  Laterality: Left;   UPPER GI ENDOSCOPY     Family History  Problem Relation Age of Onset   Hypertension Mother    Social History   Tobacco Use   Smoking status: Former    Pack years: 0.00   Smokeless tobacco: Current    Types: Chew  Substance Use Topics   Alcohol use: No   Allergies  Allergen Reactions   Aspirin Nausea Only and Other (See Comments)    325 mg aspirin cause stomach upset   Penicillins Rash   Prior to Admission medications   Medication Sig Start Date End Date Taking? Authorizing Provider  ACCU-CHEK AVIVA PLUS test strip TEST BLOOD SUGAR TWICE DAILY 11/01/20  Yes Masoud, [name], MD  Accu-Chek Softclix Lancets lancets Check blood sugar two times daily 11/01/20  Yes Masoud, [name], MD  albuterol (VENTOLIN HFA) 108 (90 Base) MCG/ACT inhaler Inhale 2 puffs into the lungs as needed for wheezing or shortness of breath. 11/09/20  Yes Masoud, [name], MD  Alcohol Swabs (B-D SINGLE USE SWABS REGULAR) PADS USE AS DIRECTED 12/27/20  Yes Masoud, [name], MD  aspirin 81 MG EC tablet Take 81 mg by mouth daily. Swallow whole.    Yes [provider]  atorvastatin (LIPITOR) 40 MG tablet TAKE 1 TABLET EVERY DAY 05/05/20  Yes [name], MD  B Complex-C (SUPER B COMPLEX PO) Take 1 tablet by mouth daily.    Yes [provider]  Blood Glucose Monitoring Suppl (ACCU-CHEK AVIVA PLUS) w/Device KIT Check blood sugar BID 11/01/20  Yes Masoud, [name], MD  budesonide-formoterol (SYMBICORT) 160-4.5 MCG/ACT inhaler Inhale 2 puffs into the lungs 2 (two) times daily. 11/01/20  Yes Masoud, [name], MD  cyclobenzaprine (FLEXERIL) 10 MG tablet Take 10 mg by mouth at bedtime as needed for muscle spasms.   Yes [provider]  empagliflozin (JARDIANCE) 10 MG TABS tablet Take 1 tablet (10 mg total) by mouth daily. 02/14/21  Yes Sharen Hones, MD  fluticasone  (FLONASE) 50 MCG/ACT nasal spray Place 1 spray into both nostrils 2 (two) times a day.  11/04/18  Yes [provider]  furosemide (LASIX) 20 MG tablet TAKE 2 TABLETS EVERY DAY 12/12/20  Yes Masoud, [name], MD  gabapentin (NEURONTIN) 300 MG capsule Take 1 capsule (300 mg total) by mouth 3 (three) times daily. 09/13/20  Yes Masoud, [name], MD  guaiFENesin (ROBITUSSIN) 100 MG/5ML SOLN Take 5 mLs (100 mg total) by mouth every 4 (four) hours as needed for cough or to loosen phlegm. 02/14/21  Yes Sharen Hones, MD  ipratropium-albuterol (DUONEB) 0.5-2.5 (3) MG/3ML SOLN Take 3 mLs by nebulization every 4 (four) hours as needed. 10/26/20  Yes Masoud, [name], MD  metFORMIN (GLUCOPHAGE) 500 MG tablet TAKE 1 TABLET TWICE DAILY 04/25/20  Yes Masoud, [name], MD  metoprolol succinate (TOPROL-XL) 25 MG 24 hr tablet Take 1 tablet (25 mg total) by mouth daily. 02/15/21  Yes Sharen Hones, MD  Omega-3 Fatty Acids (FISH OIL) 1000 MG CAPS Take 2 capsules by mouth 2 (two) times daily.    Yes [provider]  traZODone (DESYREL) 50 MG tablet TAKE 2 TABLETS EVERY DAY 07/28/20  Yes [name], MD    Review of Systems  Constitutional:  Positive for fatigue. Negative for appetite change.  HENT:  Negative for congestion, postnasal drip and sore throat.   Eyes: Negative.   Respiratory:  Positive for cough, shortness of breath and wheezing.   Cardiovascular:  Negative for chest pain, palpitations and leg swelling.  Gastrointestinal:  Negative for abdominal distention and abdominal pain.  Endocrine: Negative.   Genitourinary: Negative.   Musculoskeletal:  Positive for arthralgias (left foot). Negative for back pain and neck pain.  Skin: Negative.   Allergic/Immunologic: Negative.   Neurological:  Negative for dizziness and light-headedness.  Hematological:  Negative for adenopathy. Bruises/bleeds easily.  Psychiatric/Behavioral:  Positive for sleep disturbance (sleeping on 2 pillows; oxygen at 4L around the clock).  Negative for dysphoric mood. The patient is not nervous/anxious.    Vitals:   02/23/21 1156  BP: (!) 114/55  Pulse: 97  Resp: 20  SpO2: 93%  Weight: 203 lb 2 oz (92.1 kg)  Height: 5' 6"  (1.676 m)   Wt Readings from Last 3 Encounters:  02/23/21 203 lb 2 oz (92.1 kg)  02/13/21 199 lb 1.6 oz (90.3 kg)  01/24/21 234 lb (106.1 kg)   Lab Results  Component Value Date   CREATININE 0.73 02/14/2021   CREATININE 0.82 02/12/2021   CREATININE 0.93 02/11/2021   Physical Exam Vitals and nursing note reviewed. Exam conducted with a chaperone present (daughter).  Constitutional:      Appearance: Normal appearance.  HENT:     Head: Normocephalic and atraumatic.  Cardiovascular:     Rate and Rhythm: Normal rate and regular rhythm.  Pulmonary:     Effort: Pulmonary effort is  normal. No respiratory distress.     Breath sounds: Wheezing (throughout all lung fields) present. No rales.  Abdominal:     General: There is no distension.     Palpations: Abdomen is soft.  Musculoskeletal:        General: No tenderness.     Cervical back: Normal range of motion and neck supple.     Right lower leg: Edema (trace pitting) present.     Left lower leg: Edema (trace pitting) present.  Skin:    General: Skin is warm and dry.  Neurological:     General: No focal deficit present.     Mental Status: He is alert and oriented to person, place, and time.  Psychiatric:        Mood and Affect: Mood normal.        Behavior: Behavior normal.        Thought Content: Thought content normal.   Assessment & Plan:  1: Chronic heart failure with preserved ejection fraction without LVH/LAE- - NYHA class III - euvolemic today - weighing daily and says that his weight has been stable; reminded to call for an overnight weight gain of > 2 pounds or a weekly weight gain of > 5 pounds - not adding salt to his food and his daughter rinses any canned foods out; explained that he needed to keep his daily sodium intake  to <2022m/ day - has upcoming cardiology (Rockey Situ appointment on 03/21/21 - encouraged to elevated his legs when sitting for long periods of time - BNP 02/12/21 was 75.4  2: HTN- - BP looks good today today - saw PCP (Masoud) 11/01/20 - BMP 02/14/21 reviewed and showed sodium 139, potassium 3.7, creatinine 0.73 and GFR >60  3: DM- - A1c 01/24/21 was 6.8% - says glucose has been running in the 130's  4: COPD- - wears oxgyen at 4L around the clock - having issues with portable tank and he's going to call them when he gets home - use mucinex to help loosen the cough - has upcoming pulmonology appt on 03/29/21   Medication list reviewed.  Return in 6 weeks or sooner for any questions/problems before then.

## 2021-02-23 ENCOUNTER — Other Ambulatory Visit: Payer: Self-pay

## 2021-02-23 ENCOUNTER — Encounter: Payer: Self-pay | Admitting: Family

## 2021-02-23 ENCOUNTER — Ambulatory Visit: Payer: Medicare HMO | Attending: Family | Admitting: Family

## 2021-02-23 VITALS — BP 114/55 | HR 97 | Resp 20 | Ht 66.0 in | Wt 203.1 lb

## 2021-02-23 DIAGNOSIS — Z79899 Other long term (current) drug therapy: Secondary | ICD-10-CM | POA: Diagnosis not present

## 2021-02-23 DIAGNOSIS — Z886 Allergy status to analgesic agent status: Secondary | ICD-10-CM | POA: Insufficient documentation

## 2021-02-23 DIAGNOSIS — Z8249 Family history of ischemic heart disease and other diseases of the circulatory system: Secondary | ICD-10-CM | POA: Diagnosis not present

## 2021-02-23 DIAGNOSIS — E119 Type 2 diabetes mellitus without complications: Secondary | ICD-10-CM | POA: Diagnosis not present

## 2021-02-23 DIAGNOSIS — J449 Chronic obstructive pulmonary disease, unspecified: Secondary | ICD-10-CM | POA: Insufficient documentation

## 2021-02-23 DIAGNOSIS — Z9981 Dependence on supplemental oxygen: Secondary | ICD-10-CM | POA: Insufficient documentation

## 2021-02-23 DIAGNOSIS — Z7951 Long term (current) use of inhaled steroids: Secondary | ICD-10-CM | POA: Insufficient documentation

## 2021-02-23 DIAGNOSIS — I5032 Chronic diastolic (congestive) heart failure: Secondary | ICD-10-CM | POA: Insufficient documentation

## 2021-02-23 DIAGNOSIS — I10 Essential (primary) hypertension: Secondary | ICD-10-CM

## 2021-02-23 DIAGNOSIS — Z7984 Long term (current) use of oral hypoglycemic drugs: Secondary | ICD-10-CM | POA: Insufficient documentation

## 2021-02-23 DIAGNOSIS — I110 Hypertensive heart disease with heart failure: Secondary | ICD-10-CM | POA: Insufficient documentation

## 2021-02-23 DIAGNOSIS — Z7982 Long term (current) use of aspirin: Secondary | ICD-10-CM | POA: Diagnosis not present

## 2021-02-23 DIAGNOSIS — Z880 Allergy status to penicillin: Secondary | ICD-10-CM | POA: Diagnosis not present

## 2021-02-23 NOTE — Patient Instructions (Addendum)
Continue weighing daily and call for an overnight weight gain of > 2 pounds or a weekly weight gain of >5 pounds.    Keep sodium intake to 2000mg  a day

## 2021-02-27 ENCOUNTER — Other Ambulatory Visit: Payer: Self-pay | Admitting: Internal Medicine

## 2021-02-28 DIAGNOSIS — I5033 Acute on chronic diastolic (congestive) heart failure: Secondary | ICD-10-CM | POA: Diagnosis not present

## 2021-02-28 DIAGNOSIS — I471 Supraventricular tachycardia: Secondary | ICD-10-CM | POA: Diagnosis not present

## 2021-02-28 DIAGNOSIS — I5021 Acute systolic (congestive) heart failure: Secondary | ICD-10-CM | POA: Diagnosis not present

## 2021-02-28 DIAGNOSIS — L97929 Non-pressure chronic ulcer of unspecified part of left lower leg with unspecified severity: Secondary | ICD-10-CM | POA: Diagnosis not present

## 2021-02-28 DIAGNOSIS — I110 Hypertensive heart disease with heart failure: Secondary | ICD-10-CM | POA: Diagnosis not present

## 2021-02-28 DIAGNOSIS — J441 Chronic obstructive pulmonary disease with (acute) exacerbation: Secondary | ICD-10-CM | POA: Diagnosis not present

## 2021-02-28 DIAGNOSIS — L97919 Non-pressure chronic ulcer of unspecified part of right lower leg with unspecified severity: Secondary | ICD-10-CM | POA: Diagnosis not present

## 2021-02-28 DIAGNOSIS — I2609 Other pulmonary embolism with acute cor pulmonale: Secondary | ICD-10-CM | POA: Diagnosis not present

## 2021-02-28 DIAGNOSIS — I872 Venous insufficiency (chronic) (peripheral): Secondary | ICD-10-CM | POA: Diagnosis not present

## 2021-03-01 DIAGNOSIS — I5021 Acute systolic (congestive) heart failure: Secondary | ICD-10-CM | POA: Diagnosis not present

## 2021-03-01 DIAGNOSIS — I110 Hypertensive heart disease with heart failure: Secondary | ICD-10-CM | POA: Diagnosis not present

## 2021-03-01 DIAGNOSIS — I471 Supraventricular tachycardia: Secondary | ICD-10-CM | POA: Diagnosis not present

## 2021-03-01 DIAGNOSIS — I2609 Other pulmonary embolism with acute cor pulmonale: Secondary | ICD-10-CM | POA: Diagnosis not present

## 2021-03-01 DIAGNOSIS — L97919 Non-pressure chronic ulcer of unspecified part of right lower leg with unspecified severity: Secondary | ICD-10-CM | POA: Diagnosis not present

## 2021-03-01 DIAGNOSIS — J441 Chronic obstructive pulmonary disease with (acute) exacerbation: Secondary | ICD-10-CM | POA: Diagnosis not present

## 2021-03-01 DIAGNOSIS — L97929 Non-pressure chronic ulcer of unspecified part of left lower leg with unspecified severity: Secondary | ICD-10-CM | POA: Diagnosis not present

## 2021-03-01 DIAGNOSIS — I872 Venous insufficiency (chronic) (peripheral): Secondary | ICD-10-CM | POA: Diagnosis not present

## 2021-03-01 DIAGNOSIS — I5033 Acute on chronic diastolic (congestive) heart failure: Secondary | ICD-10-CM | POA: Diagnosis not present

## 2021-03-02 DIAGNOSIS — Z6833 Body mass index (BMI) 33.0-33.9, adult: Secondary | ICD-10-CM | POA: Diagnosis not present

## 2021-03-02 DIAGNOSIS — Z9981 Dependence on supplemental oxygen: Secondary | ICD-10-CM | POA: Diagnosis not present

## 2021-03-02 DIAGNOSIS — I509 Heart failure, unspecified: Secondary | ICD-10-CM | POA: Diagnosis not present

## 2021-03-02 DIAGNOSIS — J9621 Acute and chronic respiratory failure with hypoxia: Secondary | ICD-10-CM | POA: Diagnosis not present

## 2021-03-02 DIAGNOSIS — Z87891 Personal history of nicotine dependence: Secondary | ICD-10-CM | POA: Diagnosis not present

## 2021-03-02 DIAGNOSIS — E1159 Type 2 diabetes mellitus with other circulatory complications: Secondary | ICD-10-CM | POA: Diagnosis not present

## 2021-03-02 DIAGNOSIS — J449 Chronic obstructive pulmonary disease, unspecified: Secondary | ICD-10-CM | POA: Diagnosis not present

## 2021-03-02 DIAGNOSIS — E261 Secondary hyperaldosteronism: Secondary | ICD-10-CM | POA: Diagnosis not present

## 2021-03-06 DIAGNOSIS — I5033 Acute on chronic diastolic (congestive) heart failure: Secondary | ICD-10-CM | POA: Diagnosis not present

## 2021-03-06 DIAGNOSIS — I2609 Other pulmonary embolism with acute cor pulmonale: Secondary | ICD-10-CM | POA: Diagnosis not present

## 2021-03-06 DIAGNOSIS — L97919 Non-pressure chronic ulcer of unspecified part of right lower leg with unspecified severity: Secondary | ICD-10-CM | POA: Diagnosis not present

## 2021-03-06 DIAGNOSIS — L97929 Non-pressure chronic ulcer of unspecified part of left lower leg with unspecified severity: Secondary | ICD-10-CM | POA: Diagnosis not present

## 2021-03-06 DIAGNOSIS — I872 Venous insufficiency (chronic) (peripheral): Secondary | ICD-10-CM | POA: Diagnosis not present

## 2021-03-06 DIAGNOSIS — J441 Chronic obstructive pulmonary disease with (acute) exacerbation: Secondary | ICD-10-CM | POA: Diagnosis not present

## 2021-03-06 DIAGNOSIS — I5021 Acute systolic (congestive) heart failure: Secondary | ICD-10-CM | POA: Diagnosis not present

## 2021-03-06 DIAGNOSIS — I471 Supraventricular tachycardia: Secondary | ICD-10-CM | POA: Diagnosis not present

## 2021-03-06 DIAGNOSIS — I110 Hypertensive heart disease with heart failure: Secondary | ICD-10-CM | POA: Diagnosis not present

## 2021-03-08 ENCOUNTER — Other Ambulatory Visit: Payer: Self-pay

## 2021-03-08 ENCOUNTER — Encounter: Payer: Self-pay | Admitting: Internal Medicine

## 2021-03-08 ENCOUNTER — Ambulatory Visit (INDEPENDENT_AMBULATORY_CARE_PROVIDER_SITE_OTHER): Payer: Medicare HMO | Admitting: Internal Medicine

## 2021-03-08 VITALS — BP 140/70 | HR 80 | Wt 201.4 lb

## 2021-03-08 DIAGNOSIS — R0989 Other specified symptoms and signs involving the circulatory and respiratory systems: Secondary | ICD-10-CM | POA: Diagnosis not present

## 2021-03-08 DIAGNOSIS — I2609 Other pulmonary embolism with acute cor pulmonale: Secondary | ICD-10-CM | POA: Diagnosis not present

## 2021-03-08 DIAGNOSIS — L97529 Non-pressure chronic ulcer of other part of left foot with unspecified severity: Secondary | ICD-10-CM | POA: Insufficient documentation

## 2021-03-08 DIAGNOSIS — C8300 Small cell B-cell lymphoma, unspecified site: Secondary | ICD-10-CM | POA: Diagnosis not present

## 2021-03-08 DIAGNOSIS — E139 Other specified diabetes mellitus without complications: Secondary | ICD-10-CM | POA: Diagnosis not present

## 2021-03-08 DIAGNOSIS — E669 Obesity, unspecified: Secondary | ICD-10-CM | POA: Diagnosis not present

## 2021-03-08 DIAGNOSIS — I10 Essential (primary) hypertension: Secondary | ICD-10-CM

## 2021-03-08 MED ORDER — IPRATROPIUM-ALBUTEROL 0.5-2.5 (3) MG/3ML IN SOLN: 3.0000 mL | RESPIRATORY_TRACT | 3 refills | Status: DC | PRN

## 2021-03-08 NOTE — Assessment & Plan Note (Signed)
Stable at the present time. 

## 2021-03-08 NOTE — Assessment & Plan Note (Signed)

## 2021-03-08 NOTE — Progress Notes (Signed)
Established Patient Office Visit  Subjective:  Patient ID: [name], male    DOB: Jan 22, 1946  Age: 75 y.o. MRN: [id number]  CC:  Chief Complaint  Patient presents with   Hospitalization Follow-up    HPI  [name] presents for pain in lt foot  Past Medical History:  Diagnosis Date   CHF (congestive heart failure) ([place])    Chronic bronchitis ([place])    COPD (chronic obstructive pulmonary disease) ([place])    Diabetes mellitus without complication ([place])    Diastolic dysfunction    Dyspnea    with exertion   Hypertension     Past Surgical History:  Procedure Laterality Date   CIRCUMCISION     SHOULDER ARTHROSCOPY WITH OPEN ROTATOR CUFF REPAIR Left 06/20/2016   Procedure: SHOULDER ARTHROSCOPY WITH OPEN ROTATOR CUFF REPAIR;  Surgeon: Earnestine Leys, MD;  Location: [facility] ORS;  Service: Orthopedics;  Laterality: Left;   UPPER GI ENDOSCOPY      Family History  Problem Relation Age of Onset   Hypertension Mother     Social History   Socioeconomic History   Marital status: Widowed    Spouse name: Not on file   Number of children: 2   Years of education: Not on file   Highest education level: Not on file  Occupational History   Not on file  Tobacco Use   Smoking status: Former    Pack years: 0.00   Smokeless tobacco: Former    Types: Chew  Substance and Sexual Activity   Alcohol use: No   Drug use: No   Sexual activity: Not on file  Other Topics Concern   Not on file  Social History Narrative   Not on file   Social Determinants of Health   Financial Resource Strain: Not on file  Food Insecurity: Not on file  Transportation Needs: Not on file  Physical Activity: Not on file  Stress: Not on file  Social Connections: Not on file  Intimate Partner Violence: Not on file     Current Outpatient Medications:    ACCU-CHEK AVIVA PLUS test strip, TEST BLOOD SUGAR TWICE DAILY, Disp: 300 strip, Rfl: 3   Accu-Chek Softclix Lancets lancets, Check blood sugar  two times daily, Disp: 300 each, Rfl: 3   albuterol (VENTOLIN HFA) 108 (90 Base) MCG/ACT inhaler, Inhale 2 puffs into the lungs as needed for wheezing or shortness of breath., Disp: 18 g, Rfl: 3   Alcohol Swabs (B-D SINGLE USE SWABS REGULAR) PADS, USE AS DIRECTED, Disp: 300 each, Rfl: 3   aspirin 81 MG EC tablet, Take 81 mg by mouth daily. Swallow whole. , Disp: , Rfl:    atorvastatin (LIPITOR) 40 MG tablet, TAKE 1 TABLET EVERY DAY, Disp: 90 tablet, Rfl: 3   B Complex-C (SUPER B COMPLEX PO), Take 1 tablet by mouth daily. , Disp: , Rfl:    Blood Glucose Monitoring Suppl (ACCU-CHEK AVIVA PLUS) w/Device KIT, Check blood sugar BID, Disp: 1 kit, Rfl: 3   budesonide-formoterol (SYMBICORT) 160-4.5 MCG/ACT inhaler, Inhale 2 puffs into the lungs 2 (two) times daily., Disp: 3 each, Rfl: 3   cyclobenzaprine (FLEXERIL) 10 MG tablet, Take 10 mg by mouth at bedtime as needed for muscle spasms., Disp: , Rfl:    empagliflozin (JARDIANCE) 10 MG TABS tablet, Take 1 tablet (10 mg total) by mouth daily., Disp: 30 tablet, Rfl: 0   fluticasone (FLONASE) 50 MCG/ACT nasal spray, Place 1 spray into both nostrils 2 (two) times a day. ,  Disp: , Rfl:    furosemide (LASIX) 20 MG tablet, TAKE 2 TABLETS EVERY DAY, Disp: 180 tablet, Rfl: 6   gabapentin (NEURONTIN) 300 MG capsule, Take 1 capsule (300 mg total) by mouth 3 (three) times daily., Disp: 90 capsule, Rfl: 0   guaiFENesin (ROBITUSSIN) 100 MG/5ML SOLN, Take 5 mLs (100 mg total) by mouth every 4 (four) hours as needed for cough or to loosen phlegm., Disp: 236 mL, Rfl: 0   ipratropium-albuterol (DUONEB) 0.5-2.5 (3) MG/3ML SOLN, Take 3 mLs by nebulization every 4 (four) hours as needed., Disp: 9 mL, Rfl: 3   metFORMIN (GLUCOPHAGE) 500 MG tablet, TAKE 1 TABLET TWICE DAILY, Disp: 180 tablet, Rfl: 3   metoprolol succinate (TOPROL-XL) 25 MG 24 hr tablet, Take 1 tablet (25 mg total) by mouth daily., Disp: 60 tablet, Rfl: 0   Omega-3 Fatty Acids (FISH OIL) 1000 MG CAPS, Take 2  capsules by mouth 2 (two) times daily. , Disp: , Rfl:    traZODone (DESYREL) 50 MG tablet, TAKE 2 TABLETS EVERY DAY, Disp: 180 tablet, Rfl: 3   Allergies  Allergen Reactions   Aspirin Nausea Only and Other (See Comments)    325 mg aspirin cause stomach upset   Penicillins Rash    ROS Review of Systems  Constitutional: Negative.   HENT: Negative.    Eyes: Negative.   Respiratory:  Positive for cough and shortness of breath. Negative for chest tightness, wheezing and stridor.   Cardiovascular: Negative.  Negative for chest pain, palpitations and leg swelling.  Gastrointestinal: Negative.   Endocrine: Negative.   Genitourinary: Negative.   Musculoskeletal:  Positive for gait problem.       Left foot ulcer  Skin: Negative.   Allergic/Immunologic: Negative.   Neurological:  Negative for speech difficulty.  Hematological: Negative.   Psychiatric/Behavioral: Negative.  Negative for behavioral problems.   All other systems reviewed and are negative.    Objective:    Physical Exam Pulmonary:     Breath sounds: Decreased air movement present. No rhonchi or rales.  Musculoskeletal:       Feet:  Feet:     Comments: Left foot ulcer   BP 140/70   Pulse 80   Wt 201 lb 6.4 oz (91.4 kg)   SpO2 90% Comment: Pt on 4L of O2  BMI 32.51 kg/m  Wt Readings from Last 3 Encounters:  03/08/21 201 lb 6.4 oz (91.4 kg)  02/23/21 203 lb 2 oz (92.1 kg)  02/13/21 199 lb 1.6 oz (90.3 kg)     Health Maintenance Due  Topic Date Due   FOOT EXAM  Never done   OPHTHALMOLOGY EXAM  Never done   URINE MICROALBUMIN  Never done   Hepatitis C Screening  Never done   TETANUS/TDAP  Never done   Zoster Vaccines- Shingrix (2 of 2) 03/21/2015   COVID-19 Vaccine (3 - Pfizer risk series) 01/13/2020    There are no preventive care reminders to display for this patient.  Lab Results  Component Value Date   TSH 1.152 01/24/2021   Lab Results  Component Value Date   WBC 14.2 (H) 02/14/2021   HGB  9.7 (L) 02/14/2021   HCT 31.2 (L) 02/14/2021   MCV 96.3 02/14/2021   PLT 181 02/14/2021   Lab Results  Component Value Date   NA 139 02/14/2021   K 3.7 02/14/2021   CO2 38 (H) 02/14/2021   GLUCOSE 128 (H) 02/14/2021   BUN 34 (H) 02/14/2021   CREATININE 0.73 02/14/2021  BILITOT 1.1 01/24/2021   ALKPHOS 67 01/24/2021   AST 20 01/24/2021   ALT 18 01/24/2021   PROT 6.9 01/24/2021   ALBUMIN 3.6 01/24/2021   CALCIUM 8.2 (L) 02/14/2021   ANIONGAP 6 02/14/2021   Lab Results  Component Value Date   CHOL 101 01/26/2021   Lab Results  Component Value Date   HDL 46 01/26/2021   Lab Results  Component Value Date   LDLCALC 36 01/26/2021   Lab Results  Component Value Date   TRIG 97 01/26/2021   Lab Results  Component Value Date   CHOLHDL 2.2 01/26/2021   Lab Results  Component Value Date   HGBA1C 6.8 (H) 01/24/2021      Assessment & Plan:   Problem List Items Addressed This Visit       Cardiovascular and Mediastinum   Essential hypertension - Primary     Patient denies any chest pain or shortness of breath there is no history of palpitation or paroxysmal nocturnal dyspnea   patient was advised to follow low-salt low-cholesterol diet    ideally I want to keep systolic blood pressure below 130 mmHg, patient was asked to check blood pressure one times a week and give me a report on that.  Patient will be follow-up in 3 months  or earlier as needed, patient will call me back for any change in the cardiovascular symptoms Patient was advised to buy a book from local bookstore concerning blood pressure and read several chapters  every day.  This will be supplemented by some of the material we will give him from the office.  Patient should also utilize other resources like YouTube and Internet to learn more about the blood pressure and the diet.       Acute cor pulmonale ([place])    Patient does not have any swelling in the legs no rales are noted in the lungs, he is  wheelchair-bound.         Endocrine   Diabetes 1.5, managed as type 2 ([place])    Blood sugar is under control         Other   Diminished pulses in lower extremity    Patient will be referred to the vascular surgery       Relevant Orders   Ambulatory referral to Podiatry   Obesity (BMI 30.0-34.9)    - I encouraged the patient to lose weight.  - I educated them on making healthy dietary choices including eating more fruits and vegetables and less fried foods. - I encouraged the patient to exercise more, and educated on the benefits of exercise including weight loss, diabetes prevention, and hypertension prevention.   Dietary counseling with a registered dietician  Referral to a weight management support group (e.g. Weight Watchers, Overeaters Anonymous)  If your BMI is greater than 29 or you have gained more than 15 pounds you should work on weight loss.  Attend a healthy cooking class        Lymphoma, small lymphocytic ([place])    Refer to oncology patient has been seen by hematologist oncologist in the hospital       Ulcer of left foot ([place])    We will use DuoDERM patches       Relevant Orders   Ambulatory referral to Podiatry    Meds ordered this encounter  Medications   ipratropium-albuterol (DUONEB) 0.5-2.5 (3) MG/3ML SOLN    Sig: Take 3 mLs by nebulization every 4 (four) hours as needed.  Dispense:  9 mL    Refill:  3    Follow-up: No follow-ups on file.    [name], MD

## 2021-03-08 NOTE — Assessment & Plan Note (Signed)
Patient does not have any swelling in the legs no rales are noted in the lungs, he is wheelchair-bound.

## 2021-03-08 NOTE — Assessment & Plan Note (Signed)
Blood sugar is under control ?

## 2021-03-08 NOTE — Assessment & Plan Note (Signed)

## 2021-03-08 NOTE — Assessment & Plan Note (Signed)
We will use DuoDERM patches

## 2021-03-08 NOTE — Assessment & Plan Note (Signed)
Refer to oncology patient has been seen by hematologist oncologist in the hospital

## 2021-03-08 NOTE — Assessment & Plan Note (Signed)
Patient will be referred to the vascular surgery

## 2021-03-12 ENCOUNTER — Other Ambulatory Visit: Payer: Self-pay | Admitting: Internal Medicine

## 2021-03-13 ENCOUNTER — Other Ambulatory Visit: Payer: Self-pay | Admitting: *Deleted

## 2021-03-13 DIAGNOSIS — I5033 Acute on chronic diastolic (congestive) heart failure: Secondary | ICD-10-CM | POA: Diagnosis not present

## 2021-03-13 DIAGNOSIS — I2609 Other pulmonary embolism with acute cor pulmonale: Secondary | ICD-10-CM | POA: Diagnosis not present

## 2021-03-13 DIAGNOSIS — J441 Chronic obstructive pulmonary disease with (acute) exacerbation: Secondary | ICD-10-CM | POA: Diagnosis not present

## 2021-03-13 DIAGNOSIS — I872 Venous insufficiency (chronic) (peripheral): Secondary | ICD-10-CM | POA: Diagnosis not present

## 2021-03-13 DIAGNOSIS — I110 Hypertensive heart disease with heart failure: Secondary | ICD-10-CM | POA: Diagnosis not present

## 2021-03-13 DIAGNOSIS — L97929 Non-pressure chronic ulcer of unspecified part of left lower leg with unspecified severity: Secondary | ICD-10-CM | POA: Diagnosis not present

## 2021-03-13 DIAGNOSIS — I471 Supraventricular tachycardia: Secondary | ICD-10-CM | POA: Diagnosis not present

## 2021-03-13 DIAGNOSIS — I5021 Acute systolic (congestive) heart failure: Secondary | ICD-10-CM | POA: Diagnosis not present

## 2021-03-13 DIAGNOSIS — L97919 Non-pressure chronic ulcer of unspecified part of right lower leg with unspecified severity: Secondary | ICD-10-CM | POA: Diagnosis not present

## 2021-03-13 MED ORDER — METOPROLOL SUCCINATE ER 25 MG PO TB24: ORAL_TABLET | ORAL | 3 refills | Status: DC

## 2021-03-14 DIAGNOSIS — L97919 Non-pressure chronic ulcer of unspecified part of right lower leg with unspecified severity: Secondary | ICD-10-CM | POA: Diagnosis not present

## 2021-03-14 DIAGNOSIS — I471 Supraventricular tachycardia: Secondary | ICD-10-CM | POA: Diagnosis not present

## 2021-03-14 DIAGNOSIS — I5021 Acute systolic (congestive) heart failure: Secondary | ICD-10-CM | POA: Diagnosis not present

## 2021-03-14 DIAGNOSIS — I2609 Other pulmonary embolism with acute cor pulmonale: Secondary | ICD-10-CM | POA: Diagnosis not present

## 2021-03-14 DIAGNOSIS — L97929 Non-pressure chronic ulcer of unspecified part of left lower leg with unspecified severity: Secondary | ICD-10-CM | POA: Diagnosis not present

## 2021-03-14 DIAGNOSIS — I110 Hypertensive heart disease with heart failure: Secondary | ICD-10-CM | POA: Diagnosis not present

## 2021-03-14 DIAGNOSIS — J441 Chronic obstructive pulmonary disease with (acute) exacerbation: Secondary | ICD-10-CM | POA: Diagnosis not present

## 2021-03-14 DIAGNOSIS — I872 Venous insufficiency (chronic) (peripheral): Secondary | ICD-10-CM | POA: Diagnosis not present

## 2021-03-14 DIAGNOSIS — I5033 Acute on chronic diastolic (congestive) heart failure: Secondary | ICD-10-CM | POA: Diagnosis not present

## 2021-03-15 ENCOUNTER — Other Ambulatory Visit: Payer: Self-pay | Admitting: Internal Medicine

## 2021-03-16 ENCOUNTER — Other Ambulatory Visit: Payer: Self-pay | Admitting: Internal Medicine

## 2021-03-17 ENCOUNTER — Ambulatory Visit (INDEPENDENT_AMBULATORY_CARE_PROVIDER_SITE_OTHER): Payer: Medicare HMO

## 2021-03-17 ENCOUNTER — Ambulatory Visit: Payer: Medicare HMO | Admitting: Podiatry

## 2021-03-17 ENCOUNTER — Encounter: Payer: Self-pay | Admitting: Podiatry

## 2021-03-17 ENCOUNTER — Other Ambulatory Visit: Payer: Self-pay

## 2021-03-17 VITALS — BP 148/70 | HR 82 | Temp 97.7°F

## 2021-03-17 DIAGNOSIS — M79675 Pain in left toe(s): Secondary | ICD-10-CM

## 2021-03-17 DIAGNOSIS — M79674 Pain in right toe(s): Secondary | ICD-10-CM

## 2021-03-17 DIAGNOSIS — E10621 Type 1 diabetes mellitus with foot ulcer: Secondary | ICD-10-CM | POA: Diagnosis not present

## 2021-03-17 DIAGNOSIS — L97522 Non-pressure chronic ulcer of other part of left foot with fat layer exposed: Secondary | ICD-10-CM

## 2021-03-17 DIAGNOSIS — B351 Tinea unguium: Secondary | ICD-10-CM | POA: Diagnosis not present

## 2021-03-17 DIAGNOSIS — E08621 Diabetes mellitus due to underlying condition with foot ulcer: Secondary | ICD-10-CM | POA: Diagnosis not present

## 2021-03-18 DIAGNOSIS — L97919 Non-pressure chronic ulcer of unspecified part of right lower leg with unspecified severity: Secondary | ICD-10-CM | POA: Diagnosis not present

## 2021-03-18 DIAGNOSIS — I110 Hypertensive heart disease with heart failure: Secondary | ICD-10-CM | POA: Diagnosis not present

## 2021-03-18 DIAGNOSIS — I5021 Acute systolic (congestive) heart failure: Secondary | ICD-10-CM | POA: Diagnosis not present

## 2021-03-18 DIAGNOSIS — I2609 Other pulmonary embolism with acute cor pulmonale: Secondary | ICD-10-CM | POA: Diagnosis not present

## 2021-03-18 DIAGNOSIS — J441 Chronic obstructive pulmonary disease with (acute) exacerbation: Secondary | ICD-10-CM | POA: Diagnosis not present

## 2021-03-18 DIAGNOSIS — I471 Supraventricular tachycardia: Secondary | ICD-10-CM | POA: Diagnosis not present

## 2021-03-18 DIAGNOSIS — L97929 Non-pressure chronic ulcer of unspecified part of left lower leg with unspecified severity: Secondary | ICD-10-CM | POA: Diagnosis not present

## 2021-03-18 DIAGNOSIS — I872 Venous insufficiency (chronic) (peripheral): Secondary | ICD-10-CM | POA: Diagnosis not present

## 2021-03-18 DIAGNOSIS — I5033 Acute on chronic diastolic (congestive) heart failure: Secondary | ICD-10-CM | POA: Diagnosis not present

## 2021-03-20 ENCOUNTER — Other Ambulatory Visit: Payer: Self-pay | Admitting: Podiatry

## 2021-03-20 ENCOUNTER — Telehealth: Payer: Self-pay

## 2021-03-20 MED ORDER — GENTAMICIN SULFATE 0.1 % EX CREA: 1.0000 "application " | TOPICAL_CREAM | Freq: Two times a day (BID) | CUTANEOUS | 1 refills | Status: DC

## 2021-03-20 NOTE — Progress Notes (Signed)
Cardiology Office Note  Date:  03/21/2021   ID:  Julian Sparks, DOB 1945/11/16, MRN [id number]  PCP:  [name], MD   Chief Complaint  Patient presents with   Other    C/o edema ankles. Meds reviewed verbally with pt.    HPI:  Julian Sparks is a 75 year old gentleman with history of  morbid obesity,  COPD, on home oxygen 2 L,  diabetes type 2,  hypertension,  presenting to the hospital June 2022 with worsening shortness of breath, hypoxia, leg swelling concerning for CHF Who presents to establish care in the office, follow-up of his CHF  Admitted to PCU with Severe ACUTE Hypoxic and Hypercapnic Respiratory Failure due to acute severe decompensated combined systloic and diastolic heart failure on BiPAP Streptococcus Agalactiae pneumonia. Hemoptysis Lymphoma.  CT chest Substantial increase in bulky celiac and retroperitoneal adenopathy compared to the 03/06/2019 PET-CT.  Appearance compatible with limb for proliferative process.  In follow-up today reports he has been feeling relatively well Presents with family, they have been monitoring his fluid and salt intake  No walker 2 weeks, uses a cane Fall last week o/n, hit his eye, getting out of bed slipped on slippery vinyl kitchen floor Family monitoring weight Lasix 40 daily, with extra Lasix as needed for 3 to 5 pound weight gain [place] at home 200 pounds Weight here 204 Has little bit of leg swelling, denies abdominal distention, feels his breathing is stable  EKG personally reviewed by myself on todays visit Shows normal sinus rhythm rate 75 bpm old inferior [facility] records reviewed as below 6/1: Resp distress and mental status changes, placed on biPAP 6/2: Respiratory status improved, weaned off BiPAP to HFNC 50% FiO2 @ 60L 6/3: Overnight with agitation and unable to tolerate BiPAP with thick tan secretions, resulting in  hypoxia (O2 sats 60's) during coughing episode, transferred to [facility] ~ now weaned  back to HFNC, add IV steroids for AECOPD 6/5: Tolerated BiPAP for 3 hours last night 6/6: Remains on HHFNC (currently at 87% FiO2),BMP concerning for Contraction Metabolic Alkalosis due to aggressive Diuresis with Lasix ~ will hold Lasix and give Diamox + Metolazone; Sputum culture with Streptococcus Agalactiae ~ will start Ceftriaxone 6/7: Placed on BiPAP this morning due to SOB, CXR with new RUL alveolar infiltrate (placed on Rocephin yesterday and sputum cx with Streptococcus Agalactiae. Will decrease Diamox to 500 mg daily, continue Metolazone 2.5 mg daily 6/8: On BiPAP overnight (50% FiO2) and tolerating, plan for HHFNC during day, diuresing well and contraction alkalosis continues to improve with Diamox + metolazone 6/9: Ceftriaxone switched to Cefazolin, remains on HHFNC (currently @ 65% FiO2), diuresing well, Creatinine remains stable, foley catheter removed 6/10: CT chest concerning for R>L multifocal opacity (consistent with pnuemonia vs. PAH), along with progression of previous abdominal adenopathy (consistent with Lymphoma); Oncology consulted, Holding diuresis due to soft BP and slight increase in serum Creatinine, decrease steroids 02/06/21-patient remains on high flow nasal cannula with fragmented speech, I met with family at bedside including wife and daughter.  Patient still unable to lean back in supine position and we reviewed possibly needing cardiac cath in the future however he is very nervous about this and we explained that this will be done only when he is in chronic stable state.  He still has bilateral wheezing in all lung zones on exam today.  Has not had hemoptysis during my shift today. 02/07/21- patient still on HFNC, family at bedside. Reviewed CT with them and atelcetasis  on rll will start [place] 02/08/21- patient is weaning down on FiO2 still on HFNC 02/09/21- patient weaned to bubbler 8L/min via [place].  Plan to optimize for TRH transfer.    Echocardiogram Jan 24, 2021  1.  Left ventricular ejection fraction, by estimation, is 50 to 55%. The  left ventricle has low normal function. Hypokinesis of septal wall. Left  ventricular diastolic parameters are consistent with Grade II diastolic  dysfunction (pseudonormalization).   2. Right ventricular systolic function is moderately reduced. The right  ventricular size is severely enlarged. Tricuspid regurgitation signal is  inadequate for assessing PA pressure.   3. Right atrial size was mildly dilated.     PMH:   has a past medical history of CHF (congestive heart failure) (HCC), Chronic bronchitis ([place]), COPD (chronic obstructive pulmonary disease) ([place]), Diabetes mellitus without complication ([place]), Diastolic dysfunction, Dyspnea, and Hypertension.  PSH:    Past Surgical History:  Procedure Laterality Date   CIRCUMCISION     SHOULDER ARTHROSCOPY WITH OPEN ROTATOR CUFF REPAIR Left 06/20/2016   Procedure: SHOULDER ARTHROSCOPY WITH OPEN ROTATOR CUFF REPAIR;  Surgeon: Earnestine Leys, MD;  Location: [facility] ORS;  Service: Orthopedics;  Laterality: Left;   UPPER GI ENDOSCOPY      Current Outpatient Medications  Medication Sig Dispense Refill   ACCU-CHEK AVIVA PLUS test strip TEST BLOOD SUGAR TWICE DAILY 300 strip 3   Accu-Chek Softclix Lancets lancets Check blood sugar two times daily 300 each 3   albuterol (VENTOLIN HFA) 108 (90 Base) MCG/ACT inhaler Inhale 2 puffs into the lungs as needed for wheezing or shortness of breath. 18 g 3   Alcohol Swabs (B-D SINGLE USE SWABS REGULAR) PADS USE AS DIRECTED 300 each 3   aspirin 81 MG EC tablet Take 81 mg by mouth daily. Swallow whole.      atorvastatin (LIPITOR) 40 MG tablet TAKE 1 TABLET EVERY DAY 90 tablet 3   B Complex-C (SUPER B COMPLEX PO) Take 1 tablet by mouth daily.      Blood Glucose Monitoring Suppl (ACCU-CHEK AVIVA PLUS) w/Device KIT Check blood sugar BID 1 kit 3   budesonide-formoterol (SYMBICORT) 160-4.5 MCG/ACT inhaler Inhale 2 puffs into the lungs 2 (two)  times daily. 3 each 3   fluticasone (FLONASE) 50 MCG/ACT nasal spray Place 1 spray into both nostrils 2 (two) times a day.      furosemide (LASIX) 20 MG tablet TAKE 2 TABLETS EVERY DAY 180 tablet 6   gabapentin (NEURONTIN) 300 MG capsule Take 1 capsule (300 mg total) by mouth 3 (three) times daily. 90 capsule 0   gentamicin cream (GARAMYCIN) 0.1 % Apply 1 application topically 2 (two) times daily. 30 g 1   ipratropium-albuterol (DUONEB) 0.5-2.5 (3) MG/3ML SOLN INHALE THE CONTENTS OF 1 VIAL VIA NEBULIZER EVERY 4 HOURS AS NEEDED 90 mL 3   JARDIANCE 10 MG TABS tablet TAKE 1 TABLET(10 MG) BY MOUTH DAILY 30 tablet 0   metFORMIN (GLUCOPHAGE) 500 MG tablet TAKE 1 TABLET TWICE DAILY 180 tablet 3   metoprolol succinate (TOPROL-XL) 25 MG 24 hr tablet TAKE 1 TABLET(25 MG) BY MOUTH DAILY 90 tablet 3   Pseudoeph-Doxylamine-DM-APAP (NYQUIL PO) Take by mouth as needed.     traZODone (DESYREL) 50 MG tablet TAKE 2 TABLETS EVERY DAY 180 tablet 3   cyclobenzaprine (FLEXERIL) 10 MG tablet Take 10 mg by mouth at bedtime as needed for muscle spasms. (Patient not taking: Reported on 03/21/2021)     guaiFENesin (ROBITUSSIN) 100 MG/5ML SOLN Take 5 mLs (  100 mg total) by mouth every 4 (four) hours as needed for cough or to loosen phlegm. (Patient not taking: Reported on 03/21/2021) 236 mL 0   Omega-3 Fatty Acids (FISH OIL) 1000 MG CAPS Take 2 capsules by mouth 2 (two) times daily.  (Patient not taking: Reported on 03/21/2021)     No current facility-administered medications for this visit.     Allergies:   Aspirin and Penicillins   Social History:  The patient  reports that he has quit smoking. He has quit using smokeless tobacco.  His smokeless tobacco use included chew. He reports that he does not drink alcohol and does not use drugs.   Family History:   family history includes Hypertension in his mother.    Review of Systems: Review of Systems  Constitutional: Negative.   HENT: Negative.    Respiratory:  Positive  for shortness of breath.   Cardiovascular:  Positive for leg swelling.  Gastrointestinal: Negative.   Musculoskeletal: Negative.   Neurological: Negative.   Psychiatric/Behavioral: Negative.    All other systems reviewed and are negative.   PHYSICAL EXAM: VS:  BP 140/60 (BP Location: Right Arm, Patient Position: Sitting, Cuff Size: Normal)   Pulse 75   Ht 5' 6"  (1.676 m)   Wt 204 lb 2 oz (92.6 kg)   SpO2 97%   BMI 32.95 kg/m  , BMI Body mass index is 32.95 kg/m. GEN: Well nourished, well developed, in no acute distress, on oxygen HEENT: normal Neck: no JVD, carotid bruits, or masses Cardiac: RRR; no murmurs, rubs, or gallops, Trace pitting lower extremity edema Respiratory: Decreased breath sounds, scattered Rales GI: soft, nontender, nondistended, + BS MS: no deformity or atrophy Skin: warm and dry, no rash Neuro:  Strength and sensation are intact Psych: euthymic mood, full affect  Recent Labs: 01/24/2021: ALT 18; TSH 1.152 02/12/2021: B Natriuretic Peptide 75.4 02/14/2021: BUN 34; Creatinine, Ser 0.73; Hemoglobin 9.7; Magnesium 2.3; Platelets 181; Potassium 3.7; Sodium 139    Lipid Panel Lab Results  Component Value Date   CHOL 101 01/26/2021   HDL 46 01/26/2021   LDLCALC 36 01/26/2021   TRIG 97 01/26/2021      Wt Readings from Last 3 Encounters:  03/21/21 204 lb 2 oz (92.6 kg)  03/08/21 201 lb 6.4 oz (91.4 kg)  02/23/21 203 lb 2 oz (92.1 kg)      ASSESSMENT AND PLAN:  Problem List Items Addressed This Visit       Cardiology Problems   Essential hypertension     Other   Type 2 diabetes mellitus without complication ([place])   Other Visit Diagnoses     Chronic diastolic congestive heart failure (HCC)    -  Primary   Chronic obstructive pulmonary disease, unspecified COPD type (HCC)       Relevant Medications   Pseudoeph-Doxylamine-DM-APAP (NYQUIL PO)   Lymphoma, unspecified body region, unspecified lymphoma type ([place])          Chronic diastolic  CHF Recommend he continue Lasix 40 daily, extra Lasix 40 in the p.m. for 3 to 5 pound weight gain There is trace edema on today's visit, likely goal weight around 195 pounds Stable renal function Continue Jardiance, metoprolol succinate Consider adding ARB in follow-up  COPD exacerbation Recent prolonged hospitalization Aggressive treatment with BiPAP, antibiotics, steroids Appears to have stabilized on portable oxygen  Recent prolonged hospitalization reviewed with him in detail  Total encounter time more than 35 minutes  Greater than 50% was spent in counseling  and coordination of care with the patient    Signed, Esmond Plants, M.D., Ph.D. [place], [place]

## 2021-03-20 NOTE — Progress Notes (Signed)
   Subjective:  75 y.o. male with PMHx of diabetes mellitus presenting as a new patient for evaluation of a ulcer that developed underneath the left forefoot.  He states it has been present for about 3 months.  He experiences sharp pain and swelling.  He has been applying pads and Neosporin to the feet.  Patient is also requesting a nail trim today.  He has elongated thickened nails that are painful and tender especially in close toed shoes.  He is unable to trim his own nails   Past Medical History:  Diagnosis Date   CHF (congestive heart failure) (HCC)    Chronic bronchitis (HCC)    COPD (chronic obstructive pulmonary disease) ([place])    Diabetes mellitus without complication (HCC)    Diastolic dysfunction    Dyspnea    with exertion   Hypertension        Objective/Physical Exam General: The patient is alert and oriented x3 in no acute distress.  Dermatology:  Wound #1 noted to the subphase MTPJ left foot measuring approximately 0.6 x 0.6 x 0.2 cm (LxWxD).   To the noted ulceration(s), there is no eschar. There is minimal amount of slough, fibrin, and necrotic tissue noted. Granulation tissue and wound base is red. There is a minimal amount of serosanguineous drainage noted. There is no exposed bone muscle-tendon ligament or joint. There is no malodor. Periwound integrity is intact. Skin is warm, dry and supple bilateral lower extremities.  Vascular: Palpable pedal pulses bilaterally. No edema or erythema noted. Capillary refill within normal limits.  Neurological: Epicritic and protective threshold diminished bilaterally.   Musculoskeletal Exam: Range of motion within normal limits to all pedal and ankle joints bilateral. Muscle strength 5/5 in all groups bilateral.   Radiographic exam: Currently there is no cortical erosion or concern for osteomyelitis.  Degenerative changes noted throughout the foot.  No fractures identified.  Assessment: 1.  Ulcer subfifth MTPJ left  secondary to diabetes mellitus 2. diabetes mellitus w/ peripheral neuropathy   Plan of Care:  1. Patient was evaluated. 2. medically necessary excisional debridement including subcutaneous tissue was performed using a tissue nipper and a chisel blade. Excisional debridement of all the necrotic nonviable tissue down to healthy bleeding viable tissue was performed with post-debridement measurements same as pre-. 3. the wound was cleansed and dry sterile dressing applied. 4.  Prescription for gentamicin cream.  Apply daily  5.  Offloading felt dancers pads were applied to the insoles of the shoes to offload pressure from the fifth MTPJ  6.  Mechanical debridement of nails 1-5 bilateral was performed using a nail nipper without incident or bleeding  7.  Patient is to return to clinic in 3 weeks.   Edrick Kins, DPM [facility]  Dr. Edrick Kins, DPM    [address], [place] [address]                Office [phone]  Fax [phone]

## 2021-03-20 NOTE — Telephone Encounter (Signed)
Patient's daughter called and left a message. Patient was supposed to have an antibiotic cream sent in to his pharmacy, but they have not received it. Please send in and let [name] know thanks

## 2021-03-20 NOTE — Telephone Encounter (Signed)
Not sure who [name] is, but I sent in the prescription.  Whoever can let [name] know, that would be great! thanks, Dr. Amalia Hailey

## 2021-03-21 ENCOUNTER — Other Ambulatory Visit: Payer: Self-pay

## 2021-03-21 ENCOUNTER — Telehealth: Payer: Self-pay | Admitting: Podiatry

## 2021-03-21 ENCOUNTER — Ambulatory Visit: Payer: Medicare HMO | Admitting: Cardiovascular Disease

## 2021-03-21 VITALS — BP 140/60 | HR 75 | Ht 66.0 in | Wt 204.1 lb

## 2021-03-21 DIAGNOSIS — E119 Type 2 diabetes mellitus without complications: Secondary | ICD-10-CM | POA: Diagnosis not present

## 2021-03-21 DIAGNOSIS — J449 Chronic obstructive pulmonary disease, unspecified: Secondary | ICD-10-CM

## 2021-03-21 DIAGNOSIS — I5032 Chronic diastolic (congestive) heart failure: Secondary | ICD-10-CM | POA: Diagnosis not present

## 2021-03-21 DIAGNOSIS — C8590 Non-Hodgkin lymphoma, unspecified, unspecified site: Secondary | ICD-10-CM

## 2021-03-21 DIAGNOSIS — I10 Essential (primary) hypertension: Secondary | ICD-10-CM

## 2021-03-21 NOTE — Telephone Encounter (Signed)
Prescription sent yesterday.  Thanks, Dr. Amalia Hailey

## 2021-03-21 NOTE — Telephone Encounter (Signed)
Daughter  [name] states pt was seen on Friday and was given RX for cream. Daughter states no RX was at the pharm. [facility].

## 2021-03-21 NOTE — Patient Instructions (Signed)
Medication Instructions:  No changes  If you need a refill on your cardiac medications before your next appointment, please call your pharmacy.    Lab work: No new labs needed   If you have labs (blood work) drawn today and your tests are completely normal, you will receive your results only by: [place] (if you have MyChart) OR A paper copy in the mail If you have any lab test that is abnormal or we need to change your treatment, we will call you to review the results.   Testing/Procedures: No new testing needed   Follow-Up: At [facility], you and your health needs are our priority.  As part of our continuing mission to provide you with exceptional heart care, we have created designated Provider Care Teams.  These Care Teams include your primary Cardiologist (physician) and Advanced Practice Providers (APPs -  Physician Assistants and Nurse Practitioners) who all work together to provide you with the care you need, when you need it.  You will need a follow up appointment in 3 months  Providers on your designated Care Team:   Murray Hodgkins, NP Christell Faith, PA-C Marrianne Mood, PA-C Cadence Kathlen Mody, [place]  Any Other Special Instructions Will Be Listed Below (If Applicable).  COVID-19 Vaccine Information can be found at: ShippingScam.co.uk For questions related to vaccine distribution or appointments, please email [email or web address] or call [phone].

## 2021-03-22 ENCOUNTER — Ambulatory Visit (INDEPENDENT_AMBULATORY_CARE_PROVIDER_SITE_OTHER): Payer: Medicare HMO | Admitting: Internal Medicine

## 2021-03-22 ENCOUNTER — Encounter: Payer: Self-pay | Admitting: Internal Medicine

## 2021-03-22 VITALS — BP 121/63 | HR 90 | Ht 66.0 in | Wt 207.4 lb

## 2021-03-22 DIAGNOSIS — E139 Other specified diabetes mellitus without complications: Secondary | ICD-10-CM | POA: Diagnosis not present

## 2021-03-22 DIAGNOSIS — I2609 Other pulmonary embolism with acute cor pulmonale: Secondary | ICD-10-CM

## 2021-03-22 DIAGNOSIS — C8300 Small cell B-cell lymphoma, unspecified site: Secondary | ICD-10-CM | POA: Diagnosis not present

## 2021-03-22 DIAGNOSIS — I5033 Acute on chronic diastolic (congestive) heart failure: Secondary | ICD-10-CM | POA: Diagnosis not present

## 2021-03-22 DIAGNOSIS — J9611 Chronic respiratory failure with hypoxia: Secondary | ICD-10-CM | POA: Diagnosis not present

## 2021-03-22 DIAGNOSIS — E119 Type 2 diabetes mellitus without complications: Secondary | ICD-10-CM | POA: Diagnosis not present

## 2021-03-22 DIAGNOSIS — I10 Essential (primary) hypertension: Secondary | ICD-10-CM

## 2021-03-22 DIAGNOSIS — E669 Obesity, unspecified: Secondary | ICD-10-CM

## 2021-03-22 LAB — GLUCOSE, POCT (MANUAL RESULT ENTRY): POC Glucose: 147 mg/dl — AB (ref 70–99)

## 2021-03-22 NOTE — Assessment & Plan Note (Signed)

## 2021-03-22 NOTE — Assessment & Plan Note (Signed)
Patient is on home oxygen therapy

## 2021-03-22 NOTE — Assessment & Plan Note (Signed)
There is no pedal edema chest is clear patient has a sleep apnea so we will refer him to the sleep lab, for sleep test

## 2021-03-22 NOTE — Progress Notes (Signed)
Established Patient Office Visit  Subjective:  Patient ID: Julian Sparks, male    DOB: 1946/04/03  Age: 75 y.o. MRN: [id number]  CC:  Chief Complaint  Patient presents with   Hypertension   Diabetes    Hypertension  Diabetes   Julian Sparks presents for checkup.  He removed his right arm with a history of fall few days ago.  There is no headache.  There is ecchymosis below the right eye.  Pupils are reactive extraocular movement is okay there is no subconjunctival hemorrhage there is no no nosebleed ears are okay neck is supple Past Medical History:  Diagnosis Date   CHF (congestive heart failure) (HCC)    Chronic bronchitis (HCC)    COPD (chronic obstructive pulmonary disease) ([place])    Diabetes mellitus without complication (HCC)    Diastolic dysfunction    Dyspnea    with exertion   Hypertension     Past Surgical History:  Procedure Laterality Date   CIRCUMCISION     SHOULDER ARTHROSCOPY WITH OPEN ROTATOR CUFF REPAIR Left 06/20/2016   Procedure: SHOULDER ARTHROSCOPY WITH OPEN ROTATOR CUFF REPAIR;  Surgeon: Earnestine Leys, MD;  Location: [facility] ORS;  Service: Orthopedics;  Laterality: Left;   UPPER GI ENDOSCOPY      Family History  Problem Relation Age of Onset   Hypertension Mother     Social History   Socioeconomic History   Marital status: Widowed    Spouse name: Not on file   Number of children: 2   Years of education: Not on file   Highest education level: Not on file  Occupational History   Not on file  Tobacco Use   Smoking status: Former   Smokeless tobacco: Former    Types: Chew  Substance and Sexual Activity   Alcohol use: No   Drug use: No   Sexual activity: Not on file  Other Topics Concern   Not on file  Social History Narrative   Not on file   Social Determinants of Health   Financial Resource Strain: Not on file  Food Insecurity: Not on file  Transportation Needs: Not on file  Physical Activity: Not on file  Stress: Not on file   Social Connections: Not on file  Intimate Partner Violence: Not on file     Current Outpatient Medications:    ACCU-CHEK AVIVA PLUS test strip, TEST BLOOD SUGAR TWICE DAILY, Disp: 300 strip, Rfl: 3   Accu-Chek Softclix Lancets lancets, Check blood sugar two times daily, Disp: 300 each, Rfl: 3   albuterol (VENTOLIN HFA) 108 (90 Base) MCG/ACT inhaler, Inhale 2 puffs into the lungs as needed for wheezing or shortness of breath., Disp: 18 g, Rfl: 3   Alcohol Swabs (B-D SINGLE USE SWABS REGULAR) PADS, USE AS DIRECTED, Disp: 300 each, Rfl: 3   aspirin 81 MG EC tablet, Take 81 mg by mouth daily. Swallow whole. , Disp: , Rfl:    atorvastatin (LIPITOR) 40 MG tablet, TAKE 1 TABLET EVERY DAY, Disp: 90 tablet, Rfl: 3   B Complex-C (SUPER B COMPLEX PO), Take 1 tablet by mouth daily. , Disp: , Rfl:    Blood Glucose Monitoring Suppl (ACCU-CHEK AVIVA PLUS) w/Device KIT, Check blood sugar BID, Disp: 1 kit, Rfl: 3   budesonide-formoterol (SYMBICORT) 160-4.5 MCG/ACT inhaler, Inhale 2 puffs into the lungs 2 (two) times daily., Disp: 3 each, Rfl: 3   cyclobenzaprine (FLEXERIL) 10 MG tablet, Take 10 mg by mouth at bedtime as needed for muscle spasms.,  Disp: , Rfl:    fluticasone (FLONASE) 50 MCG/ACT nasal spray, Place 1 spray into both nostrils 2 (two) times a day. , Disp: , Rfl:    furosemide (LASIX) 20 MG tablet, TAKE 2 TABLETS EVERY DAY, Disp: 180 tablet, Rfl: 6   gabapentin (NEURONTIN) 300 MG capsule, Take 1 capsule (300 mg total) by mouth 3 (three) times daily., Disp: 90 capsule, Rfl: 0   gentamicin cream (GARAMYCIN) 0.1 %, Apply 1 application topically 2 (two) times daily., Disp: 30 g, Rfl: 1   guaiFENesin (ROBITUSSIN) 100 MG/5ML SOLN, Take 5 mLs (100 mg total) by mouth every 4 (four) hours as needed for cough or to loosen phlegm., Disp: 236 mL, Rfl: 0   ipratropium-albuterol (DUONEB) 0.5-2.5 (3) MG/3ML SOLN, INHALE THE CONTENTS OF 1 VIAL VIA NEBULIZER EVERY 4 HOURS AS NEEDED, Disp: 90 mL, Rfl: 3   JARDIANCE  10 MG TABS tablet, TAKE 1 TABLET(10 MG) BY MOUTH DAILY, Disp: 30 tablet, Rfl: 0   metFORMIN (GLUCOPHAGE) 500 MG tablet, TAKE 1 TABLET TWICE DAILY, Disp: 180 tablet, Rfl: 3   metoprolol succinate (TOPROL-XL) 25 MG 24 hr tablet, TAKE 1 TABLET(25 MG) BY MOUTH DAILY, Disp: 90 tablet, Rfl: 3   Omega-3 Fatty Acids (FISH OIL) 1000 MG CAPS, Take 2 capsules by mouth 2 (two) times daily., Disp: , Rfl:    Pseudoeph-Doxylamine-DM-APAP (NYQUIL PO), Take by mouth as needed., Disp: , Rfl:    traZODone (DESYREL) 50 MG tablet, TAKE 2 TABLETS EVERY DAY, Disp: 180 tablet, Rfl: 3   Allergies  Allergen Reactions   Aspirin Nausea Only and Other (See Comments)    325 mg aspirin cause stomach upset   Penicillins Rash    ROS Review of Systems  Constitutional: Negative.   HENT: Negative.    Eyes: Negative.   Respiratory: Negative.    Cardiovascular: Negative.   Gastrointestinal: Negative.   Endocrine: Negative.   Genitourinary: Negative.   Musculoskeletal: Negative.   Skin: Negative.   Allergic/Immunologic: Negative.   Neurological: Negative.   Hematological: Negative.   Psychiatric/Behavioral: Negative.    All other systems reviewed and are negative.    Objective:    Physical Exam Vitals reviewed.  Constitutional:      Appearance: Normal appearance.  HENT:     Mouth/Throat:     Mouth: Mucous membranes are moist.  Eyes:     Pupils: Pupils are equal, round, and reactive to light.      Comments: Ecchymosis around the right eye.  Neck:     Vascular: No carotid bruit.  Cardiovascular:     Rate and Rhythm: Normal rate and regular rhythm.     Pulses: Normal pulses.     Heart sounds: Normal heart sounds.  Pulmonary:     Effort: Pulmonary effort is normal.     Breath sounds: Normal breath sounds.  Abdominal:     General: Bowel sounds are normal.     Palpations: Abdomen is soft. There is no hepatomegaly, splenomegaly or mass.     Tenderness: There is no abdominal tenderness.     Hernia: No  hernia is present.  Musculoskeletal:     Cervical back: Neck supple.     Right lower leg: No edema.     Left lower leg: No edema.  Skin:    Findings: No rash.  Neurological:     Mental Status: He is alert and oriented to person, place, and time.     Motor: No weakness.  Psychiatric:  Mood and Affect: Mood normal.        Behavior: Behavior normal.    BP 121/63   Pulse 90   Ht 5' 6"  (1.676 m)   Wt 207 lb 5.8 oz (94.1 kg)   SpO2 (!) 89% Comment: 4 liters  BMI 33.47 kg/m  Wt Readings from Last 3 Encounters:  03/22/21 207 lb 5.8 oz (94.1 kg)  03/21/21 204 lb 2 oz (92.6 kg)  03/08/21 201 lb 6.4 oz (91.4 kg)     Health Maintenance Due  Topic Date Due   FOOT EXAM  Never done   OPHTHALMOLOGY EXAM  Never done   URINE MICROALBUMIN  Never done   Hepatitis C Screening  Never done   TETANUS/TDAP  Never done   Zoster Vaccines- Shingrix (2 of 2) 03/21/2015   COVID-19 Vaccine (3 - Pfizer risk series) 01/13/2020    There are no preventive care reminders to display for this patient.  Lab Results  Component Value Date   TSH 1.152 01/24/2021   Lab Results  Component Value Date   WBC 14.2 (H) 02/14/2021   HGB 9.7 (L) 02/14/2021   HCT 31.2 (L) 02/14/2021   MCV 96.3 02/14/2021   PLT 181 02/14/2021   Lab Results  Component Value Date   NA 139 02/14/2021   K 3.7 02/14/2021   CO2 38 (H) 02/14/2021   GLUCOSE 128 (H) 02/14/2021   BUN 34 (H) 02/14/2021   CREATININE 0.73 02/14/2021   BILITOT 1.1 01/24/2021   ALKPHOS 67 01/24/2021   AST 20 01/24/2021   ALT 18 01/24/2021   PROT 6.9 01/24/2021   ALBUMIN 3.6 01/24/2021   CALCIUM 8.2 (L) 02/14/2021   ANIONGAP 6 02/14/2021   Lab Results  Component Value Date   CHOL 101 01/26/2021   Lab Results  Component Value Date   HDL 46 01/26/2021   Lab Results  Component Value Date   LDLCALC 36 01/26/2021   Lab Results  Component Value Date   TRIG 97 01/26/2021   Lab Results  Component Value Date   CHOLHDL 2.2 01/26/2021    Lab Results  Component Value Date   HGBA1C 6.8 (H) 01/24/2021      Assessment & Plan:   Problem List Items Addressed This Visit       Cardiovascular and Mediastinum   Essential hypertension     Patient denies any chest pain or shortness of breath there is no history of palpitation or paroxysmal nocturnal dyspnea   patient was advised to follow low-salt low-cholesterol diet    ideally I want to keep systolic blood pressure below 130 mmHg, patient was asked to check blood pressure one times a week and give me a report on that.  Patient will be follow-up in 3 months  or earlier as needed, patient will call me back for any change in the cardiovascular symptoms Patient was advised to buy a book from local bookstore concerning blood pressure and read several chapters  every day.  This will be supplemented by some of the material we will give him from the office.  Patient should also utilize other resources like YouTube and Internet to learn more about the blood pressure and the diet.       Acute on chronic diastolic CHF (congestive heart failure) (HCC)    Chest is clear heart is regular       Acute cor pulmonale (HCC)    There is no pedal edema chest is clear patient has a sleep apnea so  we will refer him to the sleep lab, for sleep test         Respiratory   Respiratory failure ([place])    Patient is on home oxygen therapy         Endocrine   Diabetes 1.5, managed as type 2 ([place]) - Primary    - The patient's blood sugar is labile on med. - The patient will continue the current treatment regimen.  - I encouraged the patient to regularly check blood sugar.  - I encouraged the patient to monitor diet. I encouraged the patient to eat low-carb and low-sugar to help prevent blood sugar spikes.  - I encouraged the patient to continue following their prescribed treatment plan for diabetes - I informed the patient to get help if blood sugar drops below 5m/dL, or if suddenly have  trouble thinking clearly or breathing.  Patient was advised to buy a book on diabetes from a local bookstore or from [place]  Patient should read 2 chapters every day to keep the motivation going, this is in addition to some of the materials we provided them from the office.  There are other resources on the Internet like YouTube and wilkipedia to get an education on the diabetes       Relevant Orders   POCT glucose (manual entry) (Completed)   Type 2 diabetes mellitus without complication ([place]     Other   Obesity (BMI 30.0-34.9)    - I encouraged the patient to lose weight.  - I educated them on making healthy dietary choices including eating more fruits and vegetables and less fried foods. - I encouraged the patient to exercise more, and educated on the benefits of exercise including weight loss, diabetes prevention, and hypertension prevention.   Dietary counseling with a registered dietician  Referral to a weight management support group (e.g. Weight Watchers, Overeaters Anonymous)  If your BMI is greater than 29 or you have gained more than 15 pounds you should work on weight loss.  Attend a healthy cooking class        Lymphoma, small lymphocytic ([place]    No orders of the defined types were placed in this encounter.   Follow-up: No follow-ups on file.    JCletis Athens MD

## 2021-03-22 NOTE — Assessment & Plan Note (Signed)
Chest is clear heart is regular

## 2021-03-22 NOTE — Assessment & Plan Note (Signed)

## 2021-03-22 NOTE — Assessment & Plan Note (Signed)

## 2021-03-27 DIAGNOSIS — I471 Supraventricular tachycardia: Secondary | ICD-10-CM | POA: Diagnosis not present

## 2021-03-27 DIAGNOSIS — I110 Hypertensive heart disease with heart failure: Secondary | ICD-10-CM | POA: Diagnosis not present

## 2021-03-27 DIAGNOSIS — L97919 Non-pressure chronic ulcer of unspecified part of right lower leg with unspecified severity: Secondary | ICD-10-CM | POA: Diagnosis not present

## 2021-03-27 DIAGNOSIS — L97929 Non-pressure chronic ulcer of unspecified part of left lower leg with unspecified severity: Secondary | ICD-10-CM | POA: Diagnosis not present

## 2021-03-27 DIAGNOSIS — I2609 Other pulmonary embolism with acute cor pulmonale: Secondary | ICD-10-CM | POA: Diagnosis not present

## 2021-03-27 DIAGNOSIS — J441 Chronic obstructive pulmonary disease with (acute) exacerbation: Secondary | ICD-10-CM | POA: Diagnosis not present

## 2021-03-27 DIAGNOSIS — I5033 Acute on chronic diastolic (congestive) heart failure: Secondary | ICD-10-CM | POA: Diagnosis not present

## 2021-03-27 DIAGNOSIS — I5021 Acute systolic (congestive) heart failure: Secondary | ICD-10-CM | POA: Diagnosis not present

## 2021-03-27 DIAGNOSIS — I872 Venous insufficiency (chronic) (peripheral): Secondary | ICD-10-CM | POA: Diagnosis not present

## 2021-03-28 NOTE — Addendum Note (Signed)
Addended by: Britt Bottom on: 03/28/2021 08:40 AM   Modules accepted: Orders

## 2021-03-29 ENCOUNTER — Inpatient Hospital Stay: Payer: Medicare HMO | Admitting: Internal Medicine

## 2021-03-29 DIAGNOSIS — J449 Chronic obstructive pulmonary disease, unspecified: Secondary | ICD-10-CM | POA: Diagnosis not present

## 2021-03-29 DIAGNOSIS — Z0000 Encounter for general adult medical examination without abnormal findings: Secondary | ICD-10-CM | POA: Diagnosis not present

## 2021-04-05 ENCOUNTER — Ambulatory Visit: Payer: Medicare HMO | Admitting: Internal Medicine

## 2021-04-05 ENCOUNTER — Other Ambulatory Visit: Payer: Self-pay | Admitting: Internal Medicine

## 2021-04-06 ENCOUNTER — Encounter (INDEPENDENT_AMBULATORY_CARE_PROVIDER_SITE_OTHER): Payer: Medicare HMO | Admitting: Internal Medicine

## 2021-04-06 DIAGNOSIS — G4733 Obstructive sleep apnea (adult) (pediatric): Secondary | ICD-10-CM

## 2021-04-07 ENCOUNTER — Other Ambulatory Visit: Payer: Self-pay

## 2021-04-07 ENCOUNTER — Ambulatory Visit: Payer: Medicare HMO | Admitting: Podiatry

## 2021-04-07 DIAGNOSIS — L97522 Non-pressure chronic ulcer of other part of left foot with fat layer exposed: Secondary | ICD-10-CM | POA: Diagnosis not present

## 2021-04-07 DIAGNOSIS — I739 Peripheral vascular disease, unspecified: Secondary | ICD-10-CM | POA: Diagnosis not present

## 2021-04-07 DIAGNOSIS — E08621 Diabetes mellitus due to underlying condition with foot ulcer: Secondary | ICD-10-CM | POA: Diagnosis not present

## 2021-04-07 NOTE — Progress Notes (Signed)
   Subjective:  75 y.o. male with PMHx of diabetes mellitus presenting as a new patient for evaluation of a ulcer that developed underneath the left forefoot.  He states it has been present for about 3 months.  He experiences sharp pain and swelling.  He has been applying the gentamicin cream.  He says that he was unable to wear the offloading felt pad to the insole of the shoe due to pain.    Past Medical History:  Diagnosis Date   CHF (congestive heart failure) (HCC)    Chronic bronchitis (HCC)    COPD (chronic obstructive pulmonary disease) (HCC)    Diabetes mellitus without complication (HCC)    Diastolic dysfunction    Dyspnea    with exertion   Hypertension      Objective/Physical Exam General: The patient is alert and oriented x3 in no acute distress.  Dermatology:  Wound #1 noted to the subphase MTPJ left foot measuring approximately 0.8x0.8 x 0.2 cm (LxWxD).   To the noted ulceration(s), there is no eschar. There is minimal amount of slough, fibrin, and necrotic tissue noted. Granulation tissue and wound base is red. There is a minimal amount of serosanguineous drainage noted. There is no exposed bone muscle-tendon ligament or joint. There is no malodor. Periwound integrity is intact. Skin is warm, dry and supple bilateral lower extremities.  Vascular: Diminished pedal pulses bilaterally.  No erythema.  Moderate edema.  Capillary refill delayed  Neurological: Epicritic and protective threshold diminished bilaterally.   Musculoskeletal Exam: Range of motion within normal limits to all pedal and ankle joints bilateral. Muscle strength 5/5 in all groups bilateral.   Radiographic exam: Currently there is no cortical erosion or concern for osteomyelitis.  Degenerative changes noted throughout the foot.  No fractures identified.  Assessment: 1.  Ulcer subfifth MTPJ left secondary to diabetes mellitus 2. diabetes mellitus w/ peripheral neuropathy   Plan of Care:  1. Patient  was evaluated. 2. medically necessary excisional debridement including subcutaneous tissue was performed using a tissue nipper and a chisel blade. Excisional debridement of all the necrotic nonviable tissue down to healthy bleeding viable tissue was performed with post-debridement measurements same as pre-. 3. the wound was cleansed and dry sterile dressing applied. 4.  Continue gentamicin cream.  Apply daily  5.  Postsurgical shoe dispensed.  Offloading felt dancers pads were applied to the insole of the surgical shoe to offload pressure from the fifth MTPJ  6.  Order placed today for arterial studies bilateral lower extremities at [place] [facility].  Patient has history of PVD 7.  Patient is to return to clinic in 3 weeks.   Edrick Kins, DPM [facility]  Dr. Edrick Kins, DPM    [address], [place] [address]                Office [phone]  Fax [phone]

## 2021-04-11 DIAGNOSIS — G4733 Obstructive sleep apnea (adult) (pediatric): Secondary | ICD-10-CM | POA: Insufficient documentation

## 2021-04-11 NOTE — Procedures (Signed)
[place] Report Part I                                                                 Phone: [phone] Fax: [phone]  Patient Name: Julian Sparks, Julian Sparks Acquisition Number: [id number]  Date of Birth: 02-09-1946 Acquisition Date: 04/06/2021  Referring Physician: [name], MD     History: The patient is a 75 year old male who was referred for evaluation of possible sleep apnea. Medical History: CHF, Chronic bronchitis, COPD, diabetes, diastolic dysfunction, dyspnea, hypertension, diabetic neuropathy .  Medications: aspirin, atorvastatin, B complex-C, budesonide-formotarol, cyclobenzaprine, fluticasone, furosemide, gabapentin, Trazodone.  Procedure: This routine overnight polysomnogram was performed on the [facility] 5 using the standard diagnostic protocol. This included 6 channels of EEG, 2 channels of EOG, chin EMG, bilateral anterior tibialis EMG, nasal/oral thermistor, PTAF (nasal pressure transducer), chest and abdominal wall movements, EKG, and pulse oximetry.  Description: The total recording time was 381.3 minutes. The total sleep time was 138.5 minutes. There were a total of 231.4 minutes of wakefulness after sleep onset for a poorsleep efficiency of 36.3%. The latency to sleep onset was within normal limits at 11.4 minutes. The R sleep onset latency was N/A.  Sleep parameters, as a percentage of the total sleep time, demonstrated 2.9% of sleep was in N1 sleep, 81.6% N2, 15.5% N3 and 0.0% R sleep. There were a total of 39 arousals for an arousal index of 16.9 arousals per hour of sleep that was elevated.  Respiratory monitoring demonstrated occasional mild degree of snoring. All sleep was in the left, lateral position. There were 48 apneas and hypopneas for an Apnea Hypopnea Index of 20.8 apneas and hypopneas per hour of sleep.   The average duration of the respiratory events was 24.0 seconds with a maximum duration of 51.5 seconds. The respiratory events were  associated with peripheral oxygen desaturations on the average to 86%. The lowest oxygen desaturation associated with a respiratory event was 79%. The study was performed with supplemental oxygen 2-4 L/min. The baseline oxygen saturation during wakefulness was 80%, during NREM sleep averaged 90%, and during REM sleep averaged N/A. The total duration of oxygen < 90% was 292.8 minutes and <80% was 79.1 minutes.  Cardiac monitoring- frequent, multi-focal PVCs were observed with occasional couplets and runs of up to 5 beats.  Periodic limb movement monitoring- demonstrated that there were 269 periodic limb movements for a periodic limb movement index of 116.5 periodic limb movements per hour of sleep.     Impression: This routine overnight polysomnogram demonstrated significant obstructive sleep apnea with an overall Apnea Hypopnea Index of 20.8 apneas and hypopneas per hour of sleep. As REM sleep was not observed, the findings may underestimate the severity of the sleep apnea. Despite supplemental oxygen of 2-4 L/min, the baseline oxygen saturation remained below 90%. The oxygen saturation appeared to improve in sleep vs during wakefulness.  There was a highly elevated periodic limb movement index of 116.5 periodic limb movements per hour of sleep. In addition, frequent, quasi-periodic limb movements were observed during periods of wakefulness. The patient reports a prior diagnosis of restless leg syndrome which does not appear to be treated. Treatment may improve sleep quality if clinically appropriate.  There was a poor sleep  efficiency with anelevated arousal index, a reduced percentage of slow wave and no REM sleep.The patient was recently hospitalized and arrived at the laboratory very short of breath.   Recommendations:    A CPAP titration would be recommended due to the severity of the sleep apnea. BiPAP should be initiated if the oxygen saturation remains low once the respiratory events are  alleviate. Additionally, would recommend weight loss in a patient with a BMI of 33.9.     Allyne Gee, MD, [facility] Diplomate ABMS-Pulmonary, Critical Care and Sleep Medicine  Electronically reviewed and digitally signed     [place] Report Part II  Phone: [phone] Fax: [phone]  Patient last name Stave Neck Size 16.0 in. Acquisition [id number]  Patient first name Zayyan Weight 210.0 lbs. Started 04/06/2021 at 9:24:35 PM  Birth date August 18, 1946 Height 66.0 in. Stopped 04/07/2021 at 4:51:53 AM  Age 75 BMI 33.9 lb/in2 Duration 381.3  Study Type Adult      Report generated by: Adriana Mccallum, RPSGT Sleep Data: Lights Out: 10:30:11 PM Sleep Onset: 10:41:35 PM  Lights On: 4:51:29 AM Sleep Efficiency: 36.3 %  Total Recording Time: 381.3 min Sleep Latency (from Lights Off) 11.4 min  Total Sleep Time (TST): 138.5 min R Latency (from Sleep Onset): N/A  Sleep Period Time: 363.0 min Total number of awakenings: 5  Wake during sleep: 224.5 min Wake After Sleep Onset (WASO): 231.4 min   Sleep Data:         Arousal Summary: Stage  Latency from lights out (min) Latency from sleep onset (min) Duration (min) % Total Sleep Time  Normal values  N 1 11.4 0.0 4.0 2.9 (5%)  N 2 13.4 2.0 113.0 81.6 (50%)  N 3 132.4 121.0 21.5 15.5 (20%)  R N/A N/A 0.0 0.0 (25%)    Number Index  Spontaneous 13 5.6  Apneas & Hypopneas 11 4.8  RERAs 0 0.0       (Apneas & Hypopneas & RERAs)  (11) (4.8)  Limb Movement 27 11.7  Snore 0 0.0  TOTAL 51 22.1     Respiratory Data:  CA OA MA Apnea Hypopnea* A+ H RERA Total  Number 0 0 1 1 47 48 0 48  Mean Dur (sec) 0.0 0.0 21.0 21.0 24.1 24.0 0.0 24.0  Max Dur (sec) 0.0 0.0 21.0 21.0 51.5 51.5 0.0 51.5  Total Dur (min) 0.0 0.0 0.3 0.3 18.9 19.2 0.0 19.2  % of TST 0.0 0.0 0.3 0.3 13.6 13.9 0.0 13.9  Index (#/h TST) 0.0 0.0 0.4 0.4 20.4 20.8 0.0 20.8  *Hypopneas scored based on 4% or greater desaturation.  Sleep Stage:        REM  NREM TST  AHI N/A 20.8 20.8  RDI N/A 20.8 20.8           Body Position Data:  Sleep (min) TST (%) REM (min) NREM (min) CA (#) OA (#) MA (#) HYP (#) AHI (#/h) RERA (#) RDI (#/h) Desat (#)  Supine 0.1 0.07 0.0 0.1 0 0 0 0 0.0 0 0.00 0  Non-Supine 138.40 99.93 0.00 138.40 0.00 0.00 1.00 47.00 20.81 0 20.81 50.00  Left: 138.4 99.93 0.0 138.4 0 0 1 47 20.8 0 20.8 50  Prone: 0.0 0.00 0.0 0.0 0 0 0 0 0.0 0 0.00 0  UP: 0.0 0.00 0.0 0.0 0 0 0 0 0.0 0 0.00 0     Snoring: Total number of snoring episodes  0  Total time  with snoring    min (   % of sleep)   Oximetry Distribution:             WK REM NREM TOTAL  Average (%)   80    90 84  < 90% 230.4 0.0 62.4 292.8  < 80% 79.1 0.0 0.0 79.1  < 70% 7.7 0.0 0.0 7.7  # of Desaturations* 1 0 49 50  Desat Index (#/hour) 0.3    21.2 21.7  Desat Max (%) 6 0 8 8  Desat Max Dur (sec) 9.0 0.0 95.0 95.0  Approx Min O2 during sleep 81  Approx min O2 during a respiratory event 79  Was Oxygen added (Y/N) and final rate No:   0 LPM  *Desaturations based on 3% or greater drop from baseline.   Cheyne Stokes Breathing: None Present   Heart Rate Summary:  Average Heart Rate During Sleep 27.3 bpm      Highest Heart Rate During Sleep (95th %) 62.0 bpm      Highest Heart Rate During Sleep 164 bpm (artifact)  Highest Heart Rate During Recording (TIB) 230 bpm (artifact)   Heart Rate Observations: Event Type # Events   Bradycardia 0 Lowest HR Scored: N/A  Sinus Tachycardia During Sleep 0 Highest HR Scored: N/A  Narrow Complex Tachycardia 0 Highest HR Scored: N/A  Wide Complex Tachycardia 0 Highest HR Scored: N/A  Asystole 0 Longest Pause: N/A  Atrial Fibrillation 0 Duration Longest Event: N/A  Other Arrythmias  No Type:    Periodic Limb Movement Data: (Primary legs unless otherwise noted) Total # Limb Movement 272 Limb Movement Index 117.8  Total # PLMS 269 PLMS Index 116.5  Total # PLMS Arousals 27 PLMS Arousal Index 11.7   Percentage Sleep Time with PLMS 99.31min (72.1 % sleep)  Mean Duration limb movements (secs) 1197.9

## 2021-04-12 ENCOUNTER — Other Ambulatory Visit: Payer: Self-pay | Admitting: Internal Medicine

## 2021-04-12 NOTE — Progress Notes (Signed)
Patient ID: Julian Sparks, male    DOB: 05-14-1946, 75 y.o.   MRN: [id number]  HPI  [name] is a 75 y/o male with a history of DM, HTN, COPD, previous tobacco use and chronic heart failure.   Echo report from 01/24/21 reviewed and showed an EF of 50-55% with severe RAE but without LVH.   Admitted 01/24/21 due to acute heart failure. Placed on bipap due to worsening SOB. Given steroids due to COPD. Cardiology, wound and med onc consults obtained. Aggressively diuresed. Antibiotics given for strep in his sputum. Eventually transitioned to nasal cannula oxygen. Modified barium swallow was negative. PT/OT evaluation done. SNF recommended but he refused. Discharged after 21 days.   He presents today for a follow-up visit with a chief complaint of moderate shortness of breath with minimal exertion. He describes this as chronic in nature having been present for several years although has worsened in the last 24 hours. He has associated fatigue, productive cough, wheezing, pedal edema, light-headedness, easy bruising, difficulty sleeping and low oxygen levels along with this. He denies any abdominal distention, palpitations or chest pain. Doesn't feel like he's gained weight but isn't sure.   Daughters that are present says that he wears oxygen at 4L around the clock and his oxygen sats at home this morning was running 75-89% with the continuous oxygen on. They changed him from nasal cannula to a mask and the highest it got was 91%.   He arrived to the office on his 4L pulse oxygen and his sat was 70% even with his face mask on. He was changed to our oxygen tank providing continuous flow and increased it to 6L and his sat rose to 95%. Reduced it back to his baseline of 4L and changed his mask back to cannula and he dropped to 91%.   Does have bruising on his face from a recent fall.   Past Medical History:  Diagnosis Date   CHF (congestive heart failure) (HCC)    Chronic bronchitis (HCC)    COPD  (chronic obstructive pulmonary disease) (HCC)    Diabetes mellitus without complication (HCC)    Diastolic dysfunction    Dyspnea    with exertion   Hypertension    Past Surgical History:  Procedure Laterality Date   CIRCUMCISION     SHOULDER ARTHROSCOPY WITH OPEN ROTATOR CUFF REPAIR Left 06/20/2016   Procedure: SHOULDER ARTHROSCOPY WITH OPEN ROTATOR CUFF REPAIR;  Surgeon: Earnestine Leys, MD;  Location: [facility] ORS;  Service: Orthopedics;  Laterality: Left;   UPPER GI ENDOSCOPY     Family History  Problem Relation Age of Onset   Hypertension Mother    Social History   Tobacco Use   Smoking status: Former   Smokeless tobacco: Former    Types: Chew  Substance Use Topics   Alcohol use: No   Allergies  Allergen Reactions   Aspirin Nausea Only and Other (See Comments)    325 mg aspirin cause stomach upset   Penicillins Rash   Prior to Admission medications   Medication Sig Start Date End Date Taking? Authorizing Provider  ACCU-CHEK AVIVA PLUS test strip TEST BLOOD SUGAR TWICE DAILY 11/01/20   Julian Athens, MD  Accu-Chek Softclix Lancets lancets Check blood sugar two times daily 11/01/20   Julian Athens, MD  albuterol (VENTOLIN HFA) 108 (90 Base) MCG/ACT inhaler Inhale 2 puffs into the lungs as needed for wheezing or shortness of breath. 11/09/20   Julian Athens, MD  Alcohol Swabs (B-D SINGLE  USE SWABS REGULAR) PADS USE AS DIRECTED 12/27/20   Julian Athens, MD  aspirin 81 MG EC tablet Take 81 mg by mouth daily. Swallow whole.     [provider]  atorvastatin (LIPITOR) 40 MG tablet TAKE 1 TABLET EVERY DAY 02/28/21   Julian Athens, MD  B Complex-C (SUPER B COMPLEX PO) Take 1 tablet by mouth daily.     [provider]  Blood Glucose Monitoring Suppl (ACCU-CHEK AVIVA PLUS) w/Device KIT Check blood sugar BID 11/01/20   Julian Athens, MD  budesonide-formoterol [facility]) 160-4.5 MCG/ACT inhaler Inhale 2 puffs into the lungs 2 (two) times daily. 11/01/20   Julian Athens, MD   cyclobenzaprine (FLEXERIL) 10 MG tablet Take 10 mg by mouth at bedtime as needed for muscle spasms.    [provider]  fluticasone (FLONASE) 50 MCG/ACT nasal spray Place 1 spray into both nostrils 2 (two) times a day.  11/04/18   [provider]  furosemide (LASIX) 20 MG tablet TAKE 2 TABLETS EVERY DAY 12/12/20   Julian Athens, MD  gabapentin (NEURONTIN) 300 MG capsule Take 1 capsule (300 mg total) by mouth 3 (three) times daily. 09/13/20   Julian Athens, MD  gentamicin cream (GARAMYCIN) 0.1 % Apply 1 application topically 2 (two) times daily. 03/20/21   Julian Sparks  guaiFENesin (ROBITUSSIN) 100 MG/5ML SOLN Take 5 mLs (100 mg total) by mouth every 4 (four) hours as needed for cough or to loosen phlegm. 02/14/21   Julian Hones, MD  ipratropium-albuterol (DUONEB) 0.5-2.5 (3) MG/3ML SOLN INHALE THE CONTENTS OF 1 VIAL VIA NEBULIZER EVERY 4 HOURS AS NEEDED 03/16/21   Julian Athens, MD  JARDIANCE 10 MG TABS tablet TAKE 1 TABLET(10 MG) BY MOUTH DAILY 04/13/21   Julian Athens, MD  losartan-hydrochlorothiazide (HYZAAR) 100-25 MG tablet TAKE 1 TABLET EVERY DAY 04/06/21   Julian Athens, MD  metFORMIN (GLUCOPHAGE) 500 MG tablet TAKE 1 TABLET TWICE DAILY 03/16/21   Julian Athens, MD  metoprolol succinate (TOPROL-XL) 25 MG 24 hr tablet TAKE 1 TABLET(25 MG) BY MOUTH DAILY 03/13/21   Julian Athens, MD  Omega-3 Fatty Acids (FISH OIL) 1000 MG CAPS Take 2 capsules by mouth 2 (two) times daily.    [provider]  Pseudoeph-Doxylamine-DM-APAP (NYQUIL PO) Take by mouth as needed.    [provider]  traZODone (DESYREL) 50 MG tablet TAKE 2 TABLETS EVERY DAY 07/28/20   Julian Athens, MD    Review of Systems  Constitutional:  Positive for fatigue. Negative for appetite change.  HENT:  Negative for congestion, postnasal drip and sore throat.   Eyes: Negative.   Respiratory:  Positive for cough (productive of green sputum), shortness of breath (worsening) and wheezing.    Cardiovascular:  Positive for leg swelling (worsening). Negative for chest pain and palpitations.  Gastrointestinal:  Negative for abdominal distention and abdominal pain.  Endocrine: Negative.   Genitourinary: Negative.   Musculoskeletal:  Positive for arthralgias (left foot). Negative for back pain and neck pain.  Skin:  Positive for wound (bottom of left foot).  Allergic/Immunologic: Negative.   Neurological:  Positive for light-headedness. Negative for dizziness.  Hematological:  Negative for adenopathy. Bruises/bleeds easily.  Psychiatric/Behavioral:  Positive for sleep disturbance (sleeping on 2 pillows; oxygen at 4L around the clock). Negative for dysphoric mood. The patient is not nervous/anxious.    Vitals:   04/13/21 0955  BP: (!) 126/58  Pulse: 82  Resp: 16  SpO2: (!) 70%  Height: 5' 6"  (1.676 m)    Lab  Results  Component Value Date   CREATININE 0.73 02/14/2021   CREATININE 0.82 02/12/2021   CREATININE 0.93 02/11/2021    Physical Exam Vitals and nursing note reviewed. Exam conducted with a chaperone present (daughter).  Constitutional:      Appearance: Normal appearance.  HENT:     Head: Normocephalic and atraumatic.  Cardiovascular:     Rate and Rhythm: Normal rate and regular rhythm.  Pulmonary:     Effort: Pulmonary effort is normal. No respiratory distress.     Breath sounds: Examination of the right-lower field reveals rales. Examination of the left-lower field reveals rales. Wheezing and rales present.     Comments: Increased work of breathing Abdominal:     General: There is no distension.     Palpations: Abdomen is soft.     Tenderness: There is no abdominal tenderness.  Musculoskeletal:        General: No tenderness.     Cervical back: Normal range of motion and neck supple.     Right lower leg: Edema (2+ pitting to knee) present.     Left lower leg: Edema (2+ pitting to knee) present.  Skin:    General: Skin is warm and dry.     Findings:  Ecchymosis (right side of face) present.  Neurological:     General: No focal deficit present.     Mental Status: He is alert and oriented to person, place, and time.  Psychiatric:        Mood and Affect: Mood normal.        Behavior: Behavior normal.        Thought Content: Thought content normal.   Assessment & Plan:  1: Acute on Chronic heart failure with preserved ejection fraction without LVH/LAE- - NYHA class III - fluid overloaded today with pedal edema, worsening shortness of breath, rales and hypoxia.  - not weighing daily but thinks his weight has been stable; reminded to call for an overnight weight gain of > 2 pounds or a weekly weight gain of > 5 pounds - not adding salt to his food and his daughter rinses any canned foods out; explained that he needed to keep his daily sodium intake to <2030m/ day - saw cardiology (Julian Sparks 03/21/21 - hypoxic at 70% on 4L pulse oxygen, placed on our continuous at 6L with a face mask and he rose to 95%. Reduced back down to 4L and put on nasal cannula and he dropped to 91% fairly quickly - BNP 02/12/21 was 75.4  2: HTN- - BP looks good today - saw PCP (Julian Sparks) 03/22/21 - BMP 02/14/21 reviewed and showed sodium 139, potassium 3.7, creatinine 0.73 and GFR >60  3: DM- - A1c 01/24/21 was 6.8% - says glucose was 150 at home  4: COPD- - wears oxgyen at 4L around the clock, continuous at home and portable pulse when he's out - use mucinex to help loosen the cough - has upcoming pulmonology appt on 05/09/21  5: Obstructive sleep apnea-  - sleep study completed last week   Due to hypoxia (even on 4L with face mask), worsening shortness of breath, edema and rales, will send patient to the ED for evaluation/ treatment. NT from our office pushed patient to the ED while I called the ED triage nurse.   Follow-up pending his discharge.

## 2021-04-13 ENCOUNTER — Emergency Department: Payer: Medicare HMO

## 2021-04-13 ENCOUNTER — Ambulatory Visit (HOSPITAL_BASED_OUTPATIENT_CLINIC_OR_DEPARTMENT_OTHER): Payer: Medicare HMO | Admitting: Family

## 2021-04-13 ENCOUNTER — Inpatient Hospital Stay
Admission: EM | Admit: 2021-04-13 | Discharge: 2021-04-20 | DRG: 291 | Disposition: A | Discharge status: Home / Self Care | Payer: Medicare HMO | Attending: Internal Medicine | Admitting: Internal Medicine

## 2021-04-13 ENCOUNTER — Encounter: Payer: Self-pay | Admitting: Family

## 2021-04-13 ENCOUNTER — Other Ambulatory Visit: Payer: Self-pay

## 2021-04-13 VITALS — BP 126/58 | HR 82 | Resp 16 | Ht 66.0 in

## 2021-04-13 DIAGNOSIS — J440 Chronic obstructive pulmonary disease with acute lower respiratory infection: Secondary | ICD-10-CM | POA: Diagnosis not present

## 2021-04-13 DIAGNOSIS — E66811 Obesity, class 1: Secondary | ICD-10-CM | POA: Diagnosis present

## 2021-04-13 DIAGNOSIS — Z87891 Personal history of nicotine dependence: Secondary | ICD-10-CM | POA: Insufficient documentation

## 2021-04-13 DIAGNOSIS — Z9981 Dependence on supplemental oxygen: Secondary | ICD-10-CM | POA: Insufficient documentation

## 2021-04-13 DIAGNOSIS — W19XXXA Unspecified fall, initial encounter: Secondary | ICD-10-CM | POA: Diagnosis present

## 2021-04-13 DIAGNOSIS — I110 Hypertensive heart disease with heart failure: Principal | ICD-10-CM | POA: Diagnosis present

## 2021-04-13 DIAGNOSIS — Z886 Allergy status to analgesic agent status: Secondary | ICD-10-CM | POA: Insufficient documentation

## 2021-04-13 DIAGNOSIS — I5032 Chronic diastolic (congestive) heart failure: Secondary | ICD-10-CM

## 2021-04-13 DIAGNOSIS — J9622 Acute and chronic respiratory failure with hypercapnia: Secondary | ICD-10-CM

## 2021-04-13 DIAGNOSIS — S0083XA Contusion of other part of head, initial encounter: Secondary | ICD-10-CM | POA: Diagnosis present

## 2021-04-13 DIAGNOSIS — I5033 Acute on chronic diastolic (congestive) heart failure: Secondary | ICD-10-CM

## 2021-04-13 DIAGNOSIS — E669 Obesity, unspecified: Secondary | ICD-10-CM

## 2021-04-13 DIAGNOSIS — G4733 Obstructive sleep apnea (adult) (pediatric): Secondary | ICD-10-CM

## 2021-04-13 DIAGNOSIS — I509 Heart failure, unspecified: Secondary | ICD-10-CM

## 2021-04-13 DIAGNOSIS — Z7982 Long term (current) use of aspirin: Secondary | ICD-10-CM

## 2021-04-13 DIAGNOSIS — I10 Essential (primary) hypertension: Secondary | ICD-10-CM

## 2021-04-13 DIAGNOSIS — Z6832 Body mass index (BMI) 32.0-32.9, adult: Secondary | ICD-10-CM

## 2021-04-13 DIAGNOSIS — J9610 Chronic respiratory failure, unspecified whether with hypoxia or hypercapnia: Secondary | ICD-10-CM | POA: Diagnosis not present

## 2021-04-13 DIAGNOSIS — J189 Pneumonia, unspecified organism: Secondary | ICD-10-CM | POA: Diagnosis not present

## 2021-04-13 DIAGNOSIS — Z880 Allergy status to penicillin: Secondary | ICD-10-CM | POA: Insufficient documentation

## 2021-04-13 DIAGNOSIS — J9811 Atelectasis: Secondary | ICD-10-CM | POA: Diagnosis not present

## 2021-04-13 DIAGNOSIS — E139 Other specified diabetes mellitus without complications: Secondary | ICD-10-CM | POA: Diagnosis present

## 2021-04-13 DIAGNOSIS — Z79899 Other long term (current) drug therapy: Secondary | ICD-10-CM

## 2021-04-13 DIAGNOSIS — R0902 Hypoxemia: Secondary | ICD-10-CM | POA: Insufficient documentation

## 2021-04-13 DIAGNOSIS — J90 Pleural effusion, not elsewhere classified: Secondary | ICD-10-CM | POA: Diagnosis not present

## 2021-04-13 DIAGNOSIS — Z7984 Long term (current) use of oral hypoglycemic drugs: Secondary | ICD-10-CM

## 2021-04-13 DIAGNOSIS — I452 Bifascicular block: Secondary | ICD-10-CM | POA: Diagnosis present

## 2021-04-13 DIAGNOSIS — E785 Hyperlipidemia, unspecified: Secondary | ICD-10-CM | POA: Diagnosis present

## 2021-04-13 DIAGNOSIS — G9341 Metabolic encephalopathy: Secondary | ICD-10-CM | POA: Diagnosis present

## 2021-04-13 DIAGNOSIS — Z8249 Family history of ischemic heart disease and other diseases of the circulatory system: Secondary | ICD-10-CM | POA: Insufficient documentation

## 2021-04-13 DIAGNOSIS — E1169 Type 2 diabetes mellitus with other specified complication: Secondary | ICD-10-CM

## 2021-04-13 DIAGNOSIS — J811 Chronic pulmonary edema: Secondary | ICD-10-CM | POA: Diagnosis not present

## 2021-04-13 DIAGNOSIS — J449 Chronic obstructive pulmonary disease, unspecified: Secondary | ICD-10-CM | POA: Diagnosis not present

## 2021-04-13 DIAGNOSIS — J441 Chronic obstructive pulmonary disease with (acute) exacerbation: Secondary | ICD-10-CM | POA: Diagnosis not present

## 2021-04-13 DIAGNOSIS — E119 Type 2 diabetes mellitus without complications: Secondary | ICD-10-CM | POA: Insufficient documentation

## 2021-04-13 DIAGNOSIS — J9621 Acute and chronic respiratory failure with hypoxia: Secondary | ICD-10-CM | POA: Diagnosis not present

## 2021-04-13 DIAGNOSIS — R0602 Shortness of breath: Secondary | ICD-10-CM | POA: Diagnosis not present

## 2021-04-13 DIAGNOSIS — R059 Cough, unspecified: Secondary | ICD-10-CM | POA: Diagnosis not present

## 2021-04-13 DIAGNOSIS — I517 Cardiomegaly: Secondary | ICD-10-CM | POA: Diagnosis not present

## 2021-04-13 DIAGNOSIS — Z7951 Long term (current) use of inhaled steroids: Secondary | ICD-10-CM

## 2021-04-13 DIAGNOSIS — J159 Unspecified bacterial pneumonia: Secondary | ICD-10-CM

## 2021-04-13 DIAGNOSIS — E872 Acidosis: Secondary | ICD-10-CM | POA: Diagnosis not present

## 2021-04-13 DIAGNOSIS — Z20822 Contact with and (suspected) exposure to covid-19: Secondary | ICD-10-CM | POA: Diagnosis not present

## 2021-04-13 DIAGNOSIS — J9690 Respiratory failure, unspecified, unspecified whether with hypoxia or hypercapnia: Secondary | ICD-10-CM | POA: Diagnosis not present

## 2021-04-13 DIAGNOSIS — J9620 Acute and chronic respiratory failure, unspecified whether with hypoxia or hypercapnia: Secondary | ICD-10-CM | POA: Diagnosis present

## 2021-04-13 LAB — COMPREHENSIVE METABOLIC PANEL
ALT: 16 U/L (ref 0–44)
AST: 18 U/L (ref 15–41)
Albumin: 3.8 g/dL (ref 3.5–5.0)
Alkaline Phosphatase: 74 U/L (ref 38–126)
Anion gap: 9 (ref 5–15)
BUN: 31 mg/dL — ABNORMAL HIGH (ref 8–23)
CO2: 37 mmol/L — ABNORMAL HIGH (ref 22–32)
Calcium: 8.7 mg/dL — ABNORMAL LOW (ref 8.9–10.3)
Chloride: 92 mmol/L — ABNORMAL LOW (ref 98–111)
Creatinine, Ser: 0.88 mg/dL (ref 0.61–1.24)
GFR, Estimated: >60 mL/min (ref >60)
Glucose, Bld: 127 mg/dL — ABNORMAL HIGH (ref 70–99)
Potassium: 4.7 mmol/L (ref 3.5–5.1)
Sodium: 138 mmol/L (ref 135–145)
Total Bilirubin: 1 mg/dL (ref 0.3–1.2)
Total Protein: 7.3 g/dL (ref 6.5–8.1)

## 2021-04-13 LAB — CBC WITH DIFFERENTIAL/PLATELET
Abs Immature Granulocytes: 0.07 10*3/uL (ref 0.00–0.07)
Basophils Absolute: 0 10*3/uL (ref 0.0–0.1)
Basophils Relative: 0 %
Eosinophils Absolute: 0.2 10*3/uL (ref 0.0–0.5)
Eosinophils Relative: 2 %
HCT: 35.7 % — ABNORMAL LOW (ref 39.0–52.0)
Hemoglobin: 10.5 g/dL — ABNORMAL LOW (ref 13.0–17.0)
Immature Granulocytes: 1 %
Lymphocytes Relative: 24 %
Lymphs Abs: 2.2 10*3/uL (ref 0.7–4.0)
MCH: 28.3 pg (ref 26.0–34.0)
MCHC: 29.4 g/dL — ABNORMAL LOW (ref 30.0–36.0)
MCV: 96.2 fL (ref 80.0–100.0)
Monocytes Absolute: 0.6 10*3/uL (ref 0.1–1.0)
Monocytes Relative: 7 %
Neutro Abs: 6.1 10*3/uL (ref 1.7–7.7)
Neutrophils Relative %: 66 %
Platelets: 246 10*3/uL (ref 150–400)
RBC: 3.71 MIL/uL — ABNORMAL LOW (ref 4.22–5.81)
RDW: 17.4 % — ABNORMAL HIGH (ref 11.5–15.5)
WBC: 9.2 10*3/uL (ref 4.0–10.5)
nRBC: 0 % (ref 0.0–0.2)

## 2021-04-13 LAB — BLOOD GAS, ARTERIAL
Acid-Base Excess: 13.1 mmol/L — ABNORMAL HIGH (ref 0.0–2.0)
Acid-Base Excess: 10.6 mmol/L — ABNORMAL HIGH (ref 0.0–2.0)
Bicarbonate: 42.8 mmol/L — ABNORMAL HIGH (ref 20.0–28.0)
Bicarbonate: 39.9 mmol/L — ABNORMAL HIGH (ref 20.0–28.0)
Delivery systems: POSITIVE
Expiratory PAP: 6
FIO2: 0.44
FIO2: 0.5
Inspiratory PAP: 14
O2 Saturation: 90.4 %
O2 Saturation: 95.2 %
Patient temperature: 37
Patient temperature: 37
pCO2 arterial: 91 mmHg (ref 32.0–48.0)
pCO2 arterial: 85 mmHg (ref 32.0–48.0)
pH, Arterial: 7.28 — ABNORMAL LOW (ref 7.350–7.450)
pH, Arterial: 7.28 — ABNORMAL LOW (ref 7.350–7.450)
pO2, Arterial: 67 mmHg — ABNORMAL LOW (ref 83.0–108.0)
pO2, Arterial: 86 mmHg (ref 83.0–108.0)

## 2021-04-13 LAB — RESP PANEL BY RT-PCR (FLU A&B, COVID) ARPGX2
Influenza A by PCR: NEGATIVE
Influenza B by PCR: NEGATIVE
SARS Coronavirus 2 by RT PCR: NEGATIVE

## 2021-04-13 LAB — BRAIN NATRIURETIC PEPTIDE: B Natriuretic Peptide: 277.7 pg/mL — ABNORMAL HIGH (ref 0.0–100.0)

## 2021-04-13 LAB — TROPONIN I (HIGH SENSITIVITY)
Troponin I (High Sensitivity): 9 ng/L (ref <18)
Troponin I (High Sensitivity): 8 ng/L (ref <18)

## 2021-04-13 MED ORDER — IPRATROPIUM-ALBUTEROL 0.5-2.5 (3) MG/3ML IN SOLN
3.0000 mL | Freq: Once | RESPIRATORY_TRACT | Status: AC
  Administered 2021-04-13: 3 mL via RESPIRATORY_TRACT
  Filled 2021-04-13: qty 3

## 2021-04-13 MED ORDER — IOHEXOL 350 MG/ML SOLN
75.0000 mL | Freq: Once | INTRAVENOUS | Status: AC | PRN
  Administered 2021-04-13: 75 mL via INTRAVENOUS

## 2021-04-13 MED ORDER — ENOXAPARIN SODIUM 60 MG/0.6ML IJ SOSY
0.5000 mg/kg | PREFILLED_SYRINGE | INTRAMUSCULAR | Status: DC
  Administered 2021-04-13 – 2021-04-19 (×7): 45 mg via SUBCUTANEOUS
  Filled 2021-04-13: qty 0.45
  Filled 2021-04-13: qty 0.6
  Filled 2021-04-13 (×6): qty 0.45

## 2021-04-13 MED ORDER — OMEGA-3-ACID ETHYL ESTERS 1 G PO CAPS
2.0000 | ORAL_CAPSULE | Freq: Two times a day (BID) | ORAL | Status: DC
  Administered 2021-04-13 – 2021-04-20 (×14): 2 g via ORAL
  Filled 2021-04-13 (×16): qty 2

## 2021-04-13 MED ORDER — FUROSEMIDE 40 MG PO TABS
40.0000 mg | ORAL_TABLET | Freq: Every day | ORAL | Status: DC
  Administered 2021-04-14 – 2021-04-16 (×3): 40 mg via ORAL
  Filled 2021-04-13 (×3): qty 1

## 2021-04-13 MED ORDER — SODIUM CHLORIDE 0.9% FLUSH: 3.0000 mL | INTRAVENOUS | Status: DC | PRN

## 2021-04-13 MED ORDER — METHYLPREDNISOLONE SODIUM SUCC 125 MG IJ SOLR
125.0000 mg | Freq: Once | INTRAMUSCULAR | Status: AC
  Administered 2021-04-13: 125 mg via INTRAVENOUS
  Filled 2021-04-13: qty 2

## 2021-04-13 MED ORDER — SODIUM CHLORIDE 0.9 % IV SOLN
2.0000 g | INTRAVENOUS | Status: AC
Start: 2021-04-13 — End: 2021-04-17
  Administered 2021-04-13 – 2021-04-17 (×5): 2 g via INTRAVENOUS
  Filled 2021-04-13: qty 2
  Filled 2021-04-13 (×3): qty 20
  Filled 2021-04-13: qty 2

## 2021-04-13 MED ORDER — ACETAMINOPHEN 325 MG PO TABS
650.0000 mg | ORAL_TABLET | Freq: Four times a day (QID) | ORAL | Status: DC | PRN
  Administered 2021-04-14 – 2021-04-18 (×3): 650 mg via ORAL
  Filled 2021-04-13 (×3): qty 2

## 2021-04-13 MED ORDER — ATORVASTATIN CALCIUM 20 MG PO TABS
40.0000 mg | ORAL_TABLET | Freq: Every day | ORAL | Status: DC
  Administered 2021-04-14 – 2021-04-20 (×7): 40 mg via ORAL
  Filled 2021-04-13 (×7): qty 2

## 2021-04-13 MED ORDER — SODIUM CHLORIDE 0.9 % IV SOLN: 250.0000 mL | INTRAVENOUS | Status: DC | PRN

## 2021-04-13 MED ORDER — ASPIRIN EC 81 MG PO TBEC
81.0000 mg | DELAYED_RELEASE_TABLET | Freq: Every day | ORAL | Status: DC
  Administered 2021-04-14 – 2021-04-20 (×7): 81 mg via ORAL
  Filled 2021-04-13 (×7): qty 1

## 2021-04-13 MED ORDER — GUAIFENESIN 100 MG/5ML PO SOLN
5.0000 mL | ORAL | Status: DC | PRN
  Administered 2021-04-16 – 2021-04-17 (×3): 100 mg via ORAL
  Filled 2021-04-13 (×5): qty 5

## 2021-04-13 MED ORDER — PREDNISONE 20 MG PO TABS: 40.0000 mg | ORAL_TABLET | Freq: Every day | ORAL | Status: DC

## 2021-04-13 MED ORDER — ONDANSETRON HCL 4 MG/2ML IJ SOLN: 4.0000 mg | Freq: Four times a day (QID) | INTRAMUSCULAR | Status: DC | PRN

## 2021-04-13 MED ORDER — IPRATROPIUM-ALBUTEROL 0.5-2.5 (3) MG/3ML IN SOLN: 3.0000 mL | RESPIRATORY_TRACT | Status: DC | PRN

## 2021-04-13 MED ORDER — FUROSEMIDE 10 MG/ML IJ SOLN: 40.0000 mg | Freq: Once | INTRAMUSCULAR | Status: DC

## 2021-04-13 MED ORDER — METOPROLOL SUCCINATE ER 25 MG PO TB24
25.0000 mg | ORAL_TABLET | Freq: Every day | ORAL | Status: DC
  Administered 2021-04-14 – 2021-04-20 (×7): 25 mg via ORAL
  Filled 2021-04-13 (×7): qty 1

## 2021-04-13 MED ORDER — ACETAMINOPHEN 650 MG RE SUPP: 650.0000 mg | Freq: Four times a day (QID) | RECTAL | Status: DC | PRN

## 2021-04-13 MED ORDER — SODIUM CHLORIDE 0.9% FLUSH
3.0000 mL | Freq: Two times a day (BID) | INTRAVENOUS | Status: DC
  Administered 2021-04-13 – 2021-04-20 (×13): 3 mL via INTRAVENOUS

## 2021-04-13 MED ORDER — SODIUM CHLORIDE 0.9 % IV SOLN
500.0000 mg | INTRAVENOUS | Status: AC
  Administered 2021-04-13 – 2021-04-15 (×3): 500 mg via INTRAVENOUS
  Filled 2021-04-13 (×3): qty 500

## 2021-04-13 MED ORDER — TRAZODONE HCL 100 MG PO TABS
100.0000 mg | ORAL_TABLET | Freq: Every day | ORAL | Status: DC
  Administered 2021-04-13 – 2021-04-19 (×7): 100 mg via ORAL
  Filled 2021-04-13 (×7): qty 1

## 2021-04-13 MED ORDER — METHYLPREDNISOLONE SODIUM SUCC 40 MG IJ SOLR
40.0000 mg | Freq: Two times a day (BID) | INTRAMUSCULAR | Status: DC
Start: 2021-04-13 — End: 2021-04-14
  Administered 2021-04-13: 40 mg via INTRAVENOUS
  Filled 2021-04-13: qty 1

## 2021-04-13 MED ORDER — GABAPENTIN 300 MG PO CAPS
300.0000 mg | ORAL_CAPSULE | Freq: Three times a day (TID) | ORAL | Status: DC
  Administered 2021-04-13 – 2021-04-20 (×20): 300 mg via ORAL
  Filled 2021-04-13 (×20): qty 1

## 2021-04-13 MED ORDER — DIAZEPAM 5 MG/ML IJ SOLN
2.5000 mg | INTRAMUSCULAR | Status: AC | PRN
  Administered 2021-04-13: 2.5 mg via INTRAVENOUS
  Filled 2021-04-13: qty 2

## 2021-04-13 MED ORDER — ONDANSETRON HCL 4 MG PO TABS: 4.0000 mg | ORAL_TABLET | Freq: Four times a day (QID) | ORAL | Status: DC | PRN

## 2021-04-13 MED ORDER — CYCLOBENZAPRINE HCL 10 MG PO TABS
10.0000 mg | ORAL_TABLET | Freq: Every evening | ORAL | Status: DC | PRN
  Administered 2021-04-13 – 2021-04-18 (×5): 10 mg via ORAL
  Filled 2021-04-13 (×5): qty 1

## 2021-04-13 MED ORDER — MOMETASONE FURO-FORMOTEROL FUM 200-5 MCG/ACT IN AERO
2.0000 | INHALATION_SPRAY | Freq: Two times a day (BID) | RESPIRATORY_TRACT | Status: DC
  Administered 2021-04-13 – 2021-04-20 (×12): 2 via RESPIRATORY_TRACT
  Filled 2021-04-13: qty 8.8

## 2021-04-13 NOTE — ED Notes (Signed)
Pt transitioned to a hospital bed - pericare performed and a clean gown provided.

## 2021-04-13 NOTE — ED Notes (Signed)
Prima fit urine incontinence device applied to pt at this time.

## 2021-04-13 NOTE — ED Triage Notes (Signed)
Pt's daughter states that as she was was checking his o2 it was lower than normal, pt is always on 4L. 75-85% readings yesterday, pt went to the heart failure clinic and the readings cont to be low so pt was sent to the ER

## 2021-04-13 NOTE — ED Notes (Signed)
Pt transported tot he floor by this RN & RT.

## 2021-04-13 NOTE — ED Notes (Signed)
Pt taken off bipap to eat lunch and placed back on after pt was done eating.  Pt also had external male purewick come off, pt urinated on bed, gown and bedding changed and new male purewick placed.

## 2021-04-13 NOTE — ED Provider Notes (Signed)
[facility] Emergency Department Provider Note  ____________________________________________   Event Date/Time   First MD Initiated Contact with Patient 04/13/21 1038     (approximate)  I have reviewed the triage vital signs and the nursing notes.   HISTORY  Chief Complaint Shortness of Breath    HPI Julian Sparks is a 75 y.o. male with past medical history of COPD, CHF, diabetes, here with shortness of breath.  The patient states that over the last 24 hours, he has had progressively worsening shortness of breath.  He has some chronic shortness of breath and is on 4 L at baseline.  However, this is worsened over the last 24 hours.  He went to heart failure clinic today and was noted to be hypoxic to the 70s on his baseline oxygen.  Subsequently sent in for evaluation.  He has had some mild increase in swelling, though not severe.  He is not sure if he is gained weight.  Denies any fevers or sputum production.  He did not use a breathing treatment today.  No known sick contacts.  No other medical complaints.    Past Medical History:  Diagnosis Date   CHF (congestive heart failure) (HCC)    Chronic bronchitis (HCC)    COPD (chronic obstructive pulmonary disease) ([place])    Diabetes mellitus without complication ([place])    Diastolic dysfunction    Dyspnea    with exertion   Hypertension     Patient Active Problem List   Diagnosis Date Noted   OSA (obstructive sleep apnea) 04/11/2021   Ulcer of left foot ([place]) 03/08/2021   Paroxysmal atrial tachycardia (HCC)    Metabolic alkalosis 10/62/6948   Acute metabolic encephalopathy 54/62/7035   Lymphoma, small lymphocytic ([place]) 02/04/2021   Acute cor pulmonale (HCC)    Acute on chronic congestive heart failure (HCC)    Acute on chronic diastolic CHF (congestive heart failure) ([place]) 01/24/2021   Type 2 diabetes mellitus without complication ([place]) 00/93/8182   Acute on chronic respiratory failure ([place])  01/24/2021   Hyperkalemia 01/24/2021   Obesity (BMI 30-39.9) 01/24/2021   Shortness of breath 10/26/2020   Essential hypertension 07/13/2020   Cellulitis of right lower extremity 07/13/2020   Need for influenza vaccination 05/11/2020   Chronic respiratory failure with hypoxia ([place]) 04/27/2020   Bronchitis 04/27/2020   Pleurodynia 04/27/2020   Obesity (BMI 30.0-34.9) 02/17/2020   Diabetes 1.5, managed as type 2 ([place]) 02/17/2020   Adhesive capsulitis of both shoulders 02/17/2020   Edema 07/19/2019   Leg swelling 07/19/2019   Diminished pulses in lower extremity 07/19/2019   Bursitis of shoulder 04/03/2019   Respiratory failure ([place]) 02/04/2019   Full thickness rotator cuff tear 05/23/2016    Past Surgical History:  Procedure Laterality Date   CIRCUMCISION     SHOULDER ARTHROSCOPY WITH OPEN ROTATOR CUFF REPAIR Left 06/20/2016   Procedure: SHOULDER ARTHROSCOPY WITH OPEN ROTATOR CUFF REPAIR;  Surgeon: Earnestine Leys, MD;  Location: [facility] ORS;  Service: Orthopedics;  Laterality: Left;   UPPER GI ENDOSCOPY      Prior to Admission medications   Medication Sig Start Date End Date Taking? Authorizing Provider  ACCU-CHEK AVIVA PLUS test strip TEST BLOOD SUGAR TWICE DAILY 11/01/20   [name], MD  Accu-Chek Softclix Lancets lancets Check blood sugar two times daily 11/01/20   [name], MD  albuterol (VENTOLIN HFA) 108 (90 Base) MCG/ACT inhaler Inhale 2 puffs into the lungs as needed for wheezing or shortness of breath. 11/09/20  [name], MD  Alcohol Swabs (B-D SINGLE USE SWABS REGULAR) PADS USE AS DIRECTED 12/27/20   [name], MD  aspirin 81 MG EC tablet Take 81 mg by mouth daily. Swallow whole.     [provider]  atorvastatin (LIPITOR) 40 MG tablet TAKE 1 TABLET EVERY DAY 02/28/21   [name], MD  B Complex-C (SUPER B COMPLEX PO) Take 1 tablet by mouth daily.     [provider]  Blood Glucose Monitoring Suppl (ACCU-CHEK AVIVA PLUS) w/Device KIT Check  blood sugar BID 11/01/20   [name], MD  budesonide-formoterol [facility]) 160-4.5 MCG/ACT inhaler Inhale 2 puffs into the lungs 2 (two) times daily. 11/01/20   [name], MD  cyclobenzaprine (FLEXERIL) 10 MG tablet Take 10 mg by mouth at bedtime as needed for muscle spasms.    [provider]  fluticasone (FLONASE) 50 MCG/ACT nasal spray Place 1 spray into both nostrils 2 (two) times a day.  11/04/18   [provider]  furosemide (LASIX) 20 MG tablet TAKE 2 TABLETS EVERY DAY 12/12/20   [name], MD  gabapentin (NEURONTIN) 300 MG capsule Take 1 capsule (300 mg total) by mouth 3 (three) times daily. 09/13/20   [name], MD  gentamicin cream (GARAMYCIN) 0.1 % Apply 1 application topically 2 (two) times daily. 03/20/21   Edrick Kins, DPM  guaiFENesin (ROBITUSSIN) 100 MG/5ML SOLN Take 5 mLs (100 mg total) by mouth every 4 (four) hours as needed for cough or to loosen phlegm. 02/14/21   Sharen Hones, MD  ipratropium-albuterol (DUONEB) 0.5-2.5 (3) MG/3ML SOLN INHALE THE CONTENTS OF 1 VIAL VIA NEBULIZER EVERY 4 HOURS AS NEEDED 03/16/21   [name], MD  JARDIANCE 10 MG TABS tablet TAKE 1 TABLET(10 MG) BY MOUTH DAILY 04/13/21   [name], MD  losartan-hydrochlorothiazide (HYZAAR) 100-25 MG tablet TAKE 1 TABLET EVERY DAY 04/06/21   [name], MD  metFORMIN (GLUCOPHAGE) 500 MG tablet TAKE 1 TABLET TWICE DAILY 03/16/21   [name], MD  metoprolol succinate (TOPROL-XL) 25 MG 24 hr tablet TAKE 1 TABLET(25 MG) BY MOUTH DAILY 03/13/21   [name], MD  Omega-3 Fatty Acids (FISH OIL) 1000 MG CAPS Take 2 capsules by mouth 2 (two) times daily.    [provider]  Pseudoeph-Doxylamine-DM-APAP (NYQUIL PO) Take by mouth as needed.    [provider]  traZODone (DESYREL) 50 MG tablet TAKE 2 TABLETS EVERY DAY 07/28/20   [name], MD    Allergies Aspirin and Penicillins  Family History  Problem Relation Age of Onset   Hypertension Mother     Social  History Social History   Tobacco Use   Smoking status: Former   Smokeless tobacco: Former    Types: Chew  Substance Use Topics   Alcohol use: No   Drug use: No    Review of Systems  Review of Systems  Constitutional:  Positive for fatigue. Negative for chills and fever.  HENT:  Negative for sore throat.   Respiratory:  Positive for cough, shortness of breath and wheezing.   Cardiovascular:  Negative for chest pain.  Gastrointestinal:  Negative for abdominal pain.  Genitourinary:  Negative for flank pain.  Musculoskeletal:  Negative for neck pain.  Skin:  Negative for rash and wound.  Allergic/Immunologic: Negative for immunocompromised state.  Neurological:  Positive for weakness. Negative for numbness.  Hematological:  Does not bruise/bleed easily.  All other systems reviewed and are negative.   ____________________________________________  PHYSICAL EXAM:  VITAL SIGNS: ED Triage Vitals  Enc Vitals Group     BP --      Pulse Rate 04/13/21 1034 79     Resp 04/13/21 1034 20     Temp --      Temp Source 04/13/21 1034 Oral     SpO2 04/13/21 1034 (!) 81 %     Weight 04/13/21 1036 203 lb (92.1 kg)     Height 04/13/21 1036 5' 6"  (1.676 m)     Head Circumference --      Peak Flow --      Pain Score --      Pain Loc --      Pain Edu? --      Excl. in [place]? --      Physical Exam Vitals and nursing note reviewed.  Constitutional:      General: He is not in acute distress.    Appearance: He is well-developed.  HENT:     Head: Normocephalic and atraumatic.  Eyes:     Conjunctiva/sclera: Conjunctivae normal.  Cardiovascular:     Rate and Rhythm: Normal rate and regular rhythm.     Heart sounds: Normal heart sounds. No murmur heard.   No friction rub.  Pulmonary:     Effort: Pulmonary effort is normal. No respiratory distress.     Breath sounds: Examination of the right-lower field reveals rales. Examination of the left-lower field reveals rales. Decreased breath  sounds, wheezing and rales present.  Abdominal:     General: There is no distension.     Palpations: Abdomen is soft.     Tenderness: There is no abdominal tenderness.  Musculoskeletal:     Cervical back: Neck supple.     Right lower leg: Edema present.     Left lower leg: Edema present.  Skin:    General: Skin is warm.     Capillary Refill: Capillary refill takes less than 2 seconds.  Neurological:     Mental Status: He is alert and oriented to person, place, and time.     Motor: No abnormal muscle tone.      ____________________________________________   LABS (all labs ordered are listed, but only abnormal results are displayed)  Labs Reviewed  CBC WITH DIFFERENTIAL/PLATELET - Abnormal; Notable for the following components:      Result Value   RBC 3.71 (*)    Hemoglobin 10.5 (*)    HCT 35.7 (*)    MCHC 29.4 (*)    RDW 17.4 (*)    All other components within normal limits  COMPREHENSIVE METABOLIC PANEL - Abnormal; Notable for the following components:   Chloride 92 (*)    CO2 37 (*)    Glucose, Bld 127 (*)    BUN 31 (*)    Calcium 8.7 (*)    All other components within normal limits  BRAIN NATRIURETIC PEPTIDE - Abnormal; Notable for the following components:   B Natriuretic Peptide 277.7 (*)    All other components within normal limits  BLOOD GAS, ARTERIAL - Abnormal; Notable for the following components:   pH, Arterial 7.28 (*)    pCO2 arterial 91 (*)    pO2, Arterial 67 (*)    Bicarbonate 42.8 (*)    Acid-Base Excess 13.1 (*)    All other components within normal limits  RESP PANEL BY RT-PCR (FLU A&B, COVID) ARPGX2  TROPONIN I (HIGH SENSITIVITY)    ____________________________________________  EKG: Normal sinus rhythm, ventricular rate 77.  PR 178, QRS 1  1, QTc 459.  No acute ST elevations or depressions.  No acute evidence of acute ischemia or infarct. ________________________________________  RADIOLOGY All imaging, including plain films, CT scans, and  ultrasounds, independently reviewed by me, and interpretations confirmed via formal radiology reads.  ED MD interpretation:   Chest x-ray: Interstitial and airspace disease, possible recurrent pneumonia versus pulmonary edema  Official radiology report(s): DG Chest Portable 1 View  Result Date: 04/13/2021 CLINICAL DATA:  Shortness of breath. On home oxygen. History of lymphoma. EXAM: PORTABLE CHEST 1 VIEW COMPARISON:  02/10/2021. FINDINGS: Patient rotated left. Moderate cardiomegaly. Atherosclerosis in the transverse aorta. No pleural effusion or pneumothorax. Diffuse interstitial thickening is moderate and similar. Improved right upper lobe aeration. Right greater than left lower lung predominant interstitial and airspace opacities. IMPRESSION: Since 02/10/2021, improved right upper lobe aeration. Similar right greater than left lower lung predominant interstitial and airspace disease, suspicious for residual or recurrent pneumonia. Pulmonary edema could look similar, given underlying pulmonary interstitial thickening. Cardiomegaly. Aortic Atherosclerosis (ICD10-I70.0). Electronically Signed   By: Abigail Miyamoto M.D.   On: 04/13/2021 11:28    ____________________________________________  PROCEDURES   Procedure(s) performed (including Critical Care):  Procedures  ____________________________________________  INITIAL IMPRESSION / MDM / [place] / ED COURSE  As part of my medical decision making, I reviewed the following data within the [place] notes reviewed and incorporated, Old chart reviewed, Notes from prior ED visits, and [place] Controlled Substance Database       *[name] was evaluated in Emergency Department on 04/13/2021 for the symptoms described in the history of present illness. He was evaluated in the context of the global COVID-19 pandemic, which necessitated consideration that the patient might be at risk for infection with the SARS-CoV-2  virus that causes COVID-19. Institutional protocols and algorithms that pertain to the evaluation of patients at risk for COVID-19 are in a state of rapid change based on information released by regulatory bodies including the CDC and federal and state organizations. These policies and algorithms were followed during the patient's care in the ED.  Some ED evaluations and interventions may be delayed as a result of limited staffing during the pandemic.*     Medical Decision Making: 75 year old male here with shortness of breath and acute on chronic hypoxic respiratory distress.  Patient on 4 L at baseline but increased to 6 L here.  He appears grossly edematous with pitting edema of his lower extremities and lower abdominal wall.  He has increased work of breathing.  He was placed on BiPAP with significant improvement.  Lab work shows likely combined respiratory failure with mild acute on chronic hypercapnia as well as hypoxia.  CBC without leukocytosis.  CMP largely unremarkable.  BNP elevated above his baseline.  COVID-negative.  Will give Lasix, keep on BiPAP, and admit.  I have also given steroids and duo nebs for possible concomitant COPD.  He states he had been feeling well prior to the onset of his shortness of breath and symptoms not consistent with pneumonia at this time.  ____________________________________________  FINAL CLINICAL IMPRESSION(S) / ED DIAGNOSES  Final diagnoses:  Acute congestive heart failure, unspecified heart failure type (HCC)  Acute on chronic respiratory failure with hypoxia (HCC)     MEDICATIONS GIVEN DURING THIS VISIT:  Medications  furosemide (LASIX) injection 40 mg (has no administration in time range)  ipratropium-albuterol (DUONEB) 0.5-2.5 (3) MG/3ML nebulizer solution 3 mL (3 mLs Nebulization Given 04/13/21 1119)  ipratropium-albuterol (DUONEB) 0.5-2.5 (  3) MG/3ML nebulizer solution 3 mL (3 mLs Nebulization Given 04/13/21 1120)  ipratropium-albuterol (DUONEB)  0.5-2.5 (3) MG/3ML nebulizer solution 3 mL (3 mLs Nebulization Given 04/13/21 1119)  methylPREDNISolone sodium succinate (SOLU-MEDROL) 125 mg/2 mL injection 125 mg (125 mg Intravenous Given 04/13/21 1121)     ED Discharge Orders     None        Note:  This document was prepared using Dragon voice recognition software and may include unintentional dictation errors.   Duffy Bruce, MD 04/13/21 1140

## 2021-04-13 NOTE — Patient Instructions (Signed)
Continue weighing daily and call for an overnight weight gain of > 2 pounds or a weekly weight gain of >5 pounds. 

## 2021-04-13 NOTE — ED Notes (Signed)
Patient transported to CT 

## 2021-04-13 NOTE — H&P (Signed)
History and Physical    [name]:[id number] DOB: Aug 09, 1946 DOA: 04/13/2021  PCP: [name], MD   Patient coming from: Home  I have personally briefly reviewed patient's old medical records in [place]  Chief Complaint: Shortness of breath  HPI: Julian Sparks is a 75 y.o. male with medical history significant for COPD with chronic respiratory failure on 4 L of oxygen continuous, diabetes mellitus, chronic diastolic dysfunction CHF and hypertension who presents to the emergency room from the cardiology clinic for evaluation of worsening shortness of breath from his baseline which started 1 day prior to his admission.  He has a cough productive of greenish phlegm but denies having any fever, no chills, no leg swelling. He denies having any chest pain, no palpitations, no leg swelling, no diaphoresis, no dizziness, no lightheadedness, no abdominal pain, no changes in his bowel habits, no urinary symptoms, no headache, no fever, no chills, no blurred vision no focal deficits. He denies having any known sick contacts. His daughter checked his pulse oximetry on his 4 L of oxygen and his pulse oximetry was between 75 and 85%. Patient went to the heart failure clinic and was also noted to be hypoxic with pulse oximetry in the 70s on his 4 L of oxygen via nasal cannula and so was referred to the ER for further evaluation. Arterial blood gas 7.28/91/67/42.8/90.4 Sodium 138, potassium 4.7, chloride 92, bicarb 37, glucose 127, BUN 31, creatinine 0.88, calcium 8.7, alkaline phosphatase 74, albumin 3.8, AST 18, ALT 16, total protein 7.3, BNP 277, troponin 9, white count 9.2, hemoglobin 10.5, hematocrit 35.7, MCV 96.2, RDW 17.4, platelet count 246 Respiratory viral panel is negative Chest x-ray reviewed by me shows bilateral interstitial and airspace disease suspicious for residual or recurrent pneumonia.  Pulmonary edema could look similar.  Pulmonary interstitial thickening.   Cardiomegaly. Twelve-lead EKG reviewed by me shows sinus rhythm, incomplete right bundle branch block and left anterior fascicular block.  ED Course: Patient is a 75 year old male with a history of COPD and chronic respiratory failure on 4 L of oxygen continuous who presents to the ER from the heart failure clinic for evaluation of worsening shortness of breath from his baseline restarted 24 hours prior to his admission associated with a cough productive of greenish phlegm and hypoxia. On his 4 L of oxygen patient had pulse oximetry in the 70s. Chart review showed he had a recent polysomnogram which demonstrated significant obstructive sleep apnea with an AHI of 20.8 apneas and hypopneas per hour of sleep.  Despite supplemental oxygen of 2 to 4 L/min, patient's baseline oxygen saturation remained below 90%. Patient required BiPAP in the ER due to increased work of breathing and hypoxia. Patient received Solu-Medrol 125 mg IV as well as 3 DuoNeb treatments in the ER and will be admitted to the hospital for further evaluation.     Review of Systems: As per HPI otherwise all other systems reviewed and negative.    Past Medical History:  Diagnosis Date   CHF (congestive heart failure) (HCC)    Chronic bronchitis (HCC)    COPD (chronic obstructive pulmonary disease) ([place])    Diabetes mellitus without complication (HCC)    Diastolic dysfunction    Dyspnea    with exertion   Hypertension     Past Surgical History:  Procedure Laterality Date   CIRCUMCISION     SHOULDER ARTHROSCOPY WITH OPEN ROTATOR CUFF REPAIR Left 06/20/2016   Procedure: SHOULDER ARTHROSCOPY WITH OPEN ROTATOR CUFF  REPAIR;  Surgeon: Earnestine Leys, MD;  Location: [facility] ORS;  Service: Orthopedics;  Laterality: Left;   UPPER GI ENDOSCOPY       reports that he has quit smoking. He has quit using smokeless tobacco.  His smokeless tobacco use included chew. He reports that he does not drink alcohol and does not use  drugs.  Allergies  Allergen Reactions   Aspirin Nausea Only and Other (See Comments)    325 mg aspirin cause stomach upset   Penicillins Rash    Family History  Problem Relation Age of Onset   Hypertension Mother       Prior to Admission medications   Medication Sig Start Date End Date Taking? Authorizing Provider  ACCU-CHEK AVIVA PLUS test strip TEST BLOOD SUGAR TWICE DAILY 11/01/20   [name], MD  Accu-Chek Softclix Lancets lancets Check blood sugar two times daily 11/01/20   [name], MD  albuterol (VENTOLIN HFA) 108 (90 Base) MCG/ACT inhaler Inhale 2 puffs into the lungs as needed for wheezing or shortness of breath. 11/09/20   [name], MD  Alcohol Swabs (B-D SINGLE USE SWABS REGULAR) PADS USE AS DIRECTED 12/27/20   [name], MD  aspirin 81 MG EC tablet Take 81 mg by mouth daily. Swallow whole.     [provider]  atorvastatin (LIPITOR) 40 MG tablet TAKE 1 TABLET EVERY DAY 02/28/21   [name], MD  B Complex-C (SUPER B COMPLEX PO) Take 1 tablet by mouth daily.     [provider]  Blood Glucose Monitoring Suppl (ACCU-CHEK AVIVA PLUS) w/Device KIT Check blood sugar BID 11/01/20   [name], MD  budesonide-formoterol [facility]) 160-4.5 MCG/ACT inhaler Inhale 2 puffs into the lungs 2 (two) times daily. 11/01/20   [name], MD  cyclobenzaprine (FLEXERIL) 10 MG tablet Take 10 mg by mouth at bedtime as needed for muscle spasms.    [provider]  fluticasone (FLONASE) 50 MCG/ACT nasal spray Place 1 spray into both nostrils 2 (two) times a day.  11/04/18   [provider]  furosemide (LASIX) 20 MG tablet TAKE 2 TABLETS EVERY DAY 12/12/20   [name], MD  gabapentin (NEURONTIN) 300 MG capsule Take 1 capsule (300 mg total) by mouth 3 (three) times daily. 09/13/20   [name], MD  gentamicin cream (GARAMYCIN) 0.1 % Apply 1 application topically 2 (two) times daily. 03/20/21   Edrick Kins, DPM  guaiFENesin (ROBITUSSIN) 100  MG/5ML SOLN Take 5 mLs (100 mg total) by mouth every 4 (four) hours as needed for cough or to loosen phlegm. 02/14/21   Sharen Hones, MD  ipratropium-albuterol (DUONEB) 0.5-2.5 (3) MG/3ML SOLN INHALE THE CONTENTS OF 1 VIAL VIA NEBULIZER EVERY 4 HOURS AS NEEDED 03/16/21   [name], MD  JARDIANCE 10 MG TABS tablet TAKE 1 TABLET(10 MG) BY MOUTH DAILY 04/13/21   [name], MD  losartan-hydrochlorothiazide (HYZAAR) 100-25 MG tablet TAKE 1 TABLET EVERY DAY 04/06/21   [name], MD  metFORMIN (GLUCOPHAGE) 500 MG tablet TAKE 1 TABLET TWICE DAILY 03/16/21   [name], MD  metoprolol succinate (TOPROL-XL) 25 MG 24 hr tablet TAKE 1 TABLET(25 MG) BY MOUTH DAILY 03/13/21   [name], MD  Omega-3 Fatty Acids (FISH OIL) 1000 MG CAPS Take 2 capsules by mouth 2 (two) times daily.    [provider]  Pseudoeph-Doxylamine-DM-APAP (NYQUIL PO) Take by mouth as needed.    [provider]  traZODone (DESYREL) 50 MG tablet TAKE 2 TABLETS EVERY DAY 07/28/20  [name], MD    Physical Exam: Vitals:   04/13/21 1152 04/13/21 1200 04/13/21 1215 04/13/21 1230  BP: 116/60 114/64  114/71  Pulse: 70 72 68 (!) 57  Resp: 19 (!) 21 (!) 22 19  TempSrc:      SpO2: 99% 98% 97% 94%  Weight:      Height:         Vitals:   04/13/21 1152 04/13/21 1200 04/13/21 1215 04/13/21 1230  BP: 116/60 114/64  114/71  Pulse: 70 72 68 (!) 57  Resp: 19 (!) 21 (!) 22 19  TempSrc:      SpO2: 99% 98% 97% 94%  Weight:      Height:          Constitutional: Alert and oriented x 3 . Not in any apparent distress.  Patient is on BiPAP HEENT:      Head: Normocephalic and atraumatic.         Eyes: PERLA, EOMI, Conjunctivae are normal. Sclera is non-icteric.       Mouth/Throat: Mucous membranes are moist.       Neck: Supple with no signs of meningismus. Cardiovascular: Regular rate and rhythm. No murmurs, gallops, or rubs. 2+ symmetrical distal pulses are present . No JVD. Trace LE edema Respiratory:  Respiratory effort normal . Bilateral air entry . Faint wheezes, crackles, or rhonchi.  Gastrointestinal: Soft, non tender, and non distended with positive bowel sounds.  Genitourinary: No CVA tenderness. Musculoskeletal: Nontender with normal range of motion in all extremities. No cyanosis, or erythema of extremities. Neurologic:  Face is symmetric. Moving all extremities. No gross focal neurologic deficits . Skin: Skin is warm, dry.  No rash or ulcers Psychiatric: Mood and affect are normal    Labs on Admission: I have personally reviewed following labs and imaging studies  CBC: Recent Labs  Lab 04/13/21 1048  WBC 9.2  NEUTROABS 6.1  HGB 10.5*  HCT 35.7*  MCV 96.2  PLT 505   Basic Metabolic Panel: Recent Labs  Lab 04/13/21 1048  NA 138  K 4.7  CL 92*  CO2 37*  GLUCOSE 127*  BUN 31*  CREATININE 0.88  CALCIUM 8.7*   GFR: Estimated Creatinine Clearance: 75.9 mL/min (by C-G formula based on SCr of 0.88 mg/dL). Liver Function Tests: Recent Labs  Lab 04/13/21 1048  AST 18  ALT 16  ALKPHOS 74  BILITOT 1.0  PROT 7.3  ALBUMIN 3.8   No results for input(s): LIPASE, AMYLASE in the last 168 hours. No results for input(s): AMMONIA in the last 168 hours. Coagulation Profile: No results for input(s): INR, PROTIME in the last 168 hours. Cardiac Enzymes: No results for input(s): CKTOTAL, CKMB, CKMBINDEX, TROPONINI in the last 168 hours. BNP (last 3 results) No results for input(s): PROBNP in the last 8760 hours. HbA1C: No results for input(s): HGBA1C in the last 72 hours. CBG: No results for input(s): GLUCAP in the last 168 hours. Lipid Profile: No results for input(s): CHOL, HDL, LDLCALC, TRIG, CHOLHDL, LDLDIRECT in the last 72 hours. Thyroid Function Tests: No results for input(s): TSH, T4TOTAL, FREET4, T3FREE, THYROIDAB in the last 72 hours. Anemia Panel: No results for input(s): VITAMINB12, FOLATE, FERRITIN, TIBC, IRON, RETICCTPCT in the last 72 hours. Urine  analysis:    Component Value Date/Time   COLORURINE YELLOW 02/11/2012 [place] 02/11/2012 1608   LABSPEC 1.010 02/11/2012 1608   PHURINE 6.5 02/11/2012 1608   GLUCOSEU NEGATIVE 02/11/2012 1608   HGBUR 2+ 02/11/2012 1608  BILIRUBINUR NEGATIVE 02/11/2012 [place] 02/11/2012 1608   PROTEINUR NEGATIVE 02/11/2012 1608   NITRITE NEGATIVE 02/11/2012 1608   LEUKOCYTESUR NEGATIVE 02/11/2012 1608    Radiological Exams on Admission: DG Chest Portable 1 View  Result Date: 04/13/2021 CLINICAL DATA:  Shortness of breath. On home oxygen. History of lymphoma. EXAM: PORTABLE CHEST 1 VIEW COMPARISON:  02/10/2021. FINDINGS: Patient rotated left. Moderate cardiomegaly. Atherosclerosis in the transverse aorta. No pleural effusion or pneumothorax. Diffuse interstitial thickening is moderate and similar. Improved right upper lobe aeration. Right greater than left lower lung predominant interstitial and airspace opacities. IMPRESSION: Since 02/10/2021, improved right upper lobe aeration. Similar right greater than left lower lung predominant interstitial and airspace disease, suspicious for residual or recurrent pneumonia. Pulmonary edema could look similar, given underlying pulmonary interstitial thickening. Cardiomegaly. Aortic Atherosclerosis (ICD10-I70.0). Electronically Signed   By: Abigail Miyamoto M.D.   On: 04/13/2021 11:28     Assessment/Plan Principal Problem:   Acute on chronic respiratory failure (HCC) Active Problems:   Obesity (BMI 30.0-34.9)   Diabetes 1.5, managed as type 2 ([place])   Essential hypertension   Chronic diastolic CHF (congestive heart failure) (HCC)   Type 2 diabetes mellitus without complication (HCC)   OSA (obstructive sleep apnea)       Acute on chronic hypercapnic respiratory failure Most likely secondary to pneumonia/COPD exacerbation Patient's blood gas showed uncompensated respiratory acidosis Continue BiPAP and repeat arterial blood  gas in 2 hours Continue as needed bronchodilator therapy and systemic steroids Place patient on ongoing conservative therapy with Rocephin 1 g IV daily and Zithromax 500 mg IV daily Wean off BiPAP as tolerated Will obtain CT angiogram to rule out PE due to patient's worsening hypoxia.    Chronic diastolic dysfunction CHF Continue furosemide and metoprolol     Type 2 diabetes mellitus Maintain consistent carbohydrate diet Glycemic control with sliding scale insulin     Obesity with complications of obstructive sleep apnea Continue CPAP at bedtime  DVT prophylaxis: Lovenox  Code Status: full code  Family Communication: Greater than 50% of time was spent discussing patient's condition and plan of care with him at the bedside.  All questions and concerns have been addressed.  He verbalizes understanding and agrees with the plan. Disposition Plan: Back to previous home environment Consults called: none  Status: At the time of admission, it appears that the appropriate admission status for this patient is inpatient. This is judged to be reasonable and necessary in order to provide the required intensity of service to ensure the patient's safety given the presenting symptoms, physical exam findings and initial radiographic and laboratory data in the context of their comorbid conditions. Patient requires inpatient status due to high intensity of service, high risk for further deterioration and high frequency of surveillance required.    Collier Bullock MD [facility]     04/13/2021, 12:56 PM

## 2021-04-13 NOTE — ED Notes (Signed)
MD made aware of the patients consistent BiPAP use and his oral medication order.

## 2021-04-14 DIAGNOSIS — J189 Pneumonia, unspecified organism: Secondary | ICD-10-CM

## 2021-04-14 DIAGNOSIS — E785 Hyperlipidemia, unspecified: Secondary | ICD-10-CM

## 2021-04-14 DIAGNOSIS — J159 Unspecified bacterial pneumonia: Secondary | ICD-10-CM

## 2021-04-14 DIAGNOSIS — G9341 Metabolic encephalopathy: Secondary | ICD-10-CM

## 2021-04-14 DIAGNOSIS — J441 Chronic obstructive pulmonary disease with (acute) exacerbation: Secondary | ICD-10-CM

## 2021-04-14 DIAGNOSIS — E1169 Type 2 diabetes mellitus with other specified complication: Secondary | ICD-10-CM

## 2021-04-14 LAB — GLUCOSE, CAPILLARY
Glucose-Capillary: 137 mg/dL — ABNORMAL HIGH (ref 70–99)
Glucose-Capillary: 142 mg/dL — ABNORMAL HIGH (ref 70–99)
Glucose-Capillary: 175 mg/dL — ABNORMAL HIGH (ref 70–99)
Glucose-Capillary: 185 mg/dL — ABNORMAL HIGH (ref 70–99)
Glucose-Capillary: 205 mg/dL — ABNORMAL HIGH (ref 70–99)

## 2021-04-14 LAB — HEMOGLOBIN A1C
Hgb A1c MFr Bld: 6.8 % — ABNORMAL HIGH (ref 4.8–5.6)
Mean Plasma Glucose: 148.46 mg/dL

## 2021-04-14 LAB — BLOOD GAS, ARTERIAL
Acid-Base Excess: 12.5 mmol/L — ABNORMAL HIGH (ref 0.0–2.0)
Bicarbonate: 41.8 mmol/L — ABNORMAL HIGH (ref 20.0–28.0)
FIO2: 0.44
O2 Saturation: 60.6 %
Patient temperature: 37
pCO2 arterial: 89 mmHg (ref 32.0–48.0)
pH, Arterial: 7.28 — ABNORMAL LOW (ref 7.350–7.450)
pO2, Arterial: 36 mmHg — CL (ref 83.0–108.0)

## 2021-04-14 LAB — CBC
HCT: 31.4 % — ABNORMAL LOW (ref 39.0–52.0)
Hemoglobin: 9.4 g/dL — ABNORMAL LOW (ref 13.0–17.0)
MCH: 28 pg (ref 26.0–34.0)
MCHC: 29.9 g/dL — ABNORMAL LOW (ref 30.0–36.0)
MCV: 93.5 fL (ref 80.0–100.0)
Platelets: 225 10*3/uL (ref 150–400)
RBC: 3.36 MIL/uL — ABNORMAL LOW (ref 4.22–5.81)
RDW: 17.2 % — ABNORMAL HIGH (ref 11.5–15.5)
WBC: 6.2 10*3/uL (ref 4.0–10.5)
nRBC: 0 % (ref 0.0–0.2)

## 2021-04-14 LAB — BASIC METABOLIC PANEL
Anion gap: 12 (ref 5–15)
BUN: 40 mg/dL — ABNORMAL HIGH (ref 8–23)
CO2: 31 mmol/L (ref 22–32)
Calcium: 8.4 mg/dL — ABNORMAL LOW (ref 8.9–10.3)
Chloride: 94 mmol/L — ABNORMAL LOW (ref 98–111)
Creatinine, Ser: 0.98 mg/dL (ref 0.61–1.24)
GFR, Estimated: >60 mL/min (ref >60)
Glucose, Bld: 185 mg/dL — ABNORMAL HIGH (ref 70–99)
Potassium: 4.5 mmol/L (ref 3.5–5.1)
Sodium: 137 mmol/L (ref 135–145)

## 2021-04-14 MED ORDER — HALOPERIDOL LACTATE 5 MG/ML IJ SOLN
1.0000 mg | Freq: Four times a day (QID) | INTRAMUSCULAR | Status: DC | PRN
Start: 2021-04-14 — End: 2021-04-19
  Administered 2021-04-16: 1 mg via INTRAVENOUS
  Filled 2021-04-14: qty 1

## 2021-04-14 MED ORDER — DIAZEPAM 5 MG/ML IJ SOLN
2.5000 mg | INTRAMUSCULAR | Status: AC | PRN
Start: 2021-04-14 — End: 2021-04-14
  Administered 2021-04-14: 2.5 mg via INTRAVENOUS
  Filled 2021-04-14: qty 2

## 2021-04-14 MED ORDER — INSULIN ASPART 100 UNIT/ML IJ SOLN
0.0000 [IU] | Freq: Three times a day (TID) | INTRAMUSCULAR | Status: DC
  Administered 2021-04-14 – 2021-04-16 (×5): 3 [IU] via SUBCUTANEOUS
  Administered 2021-04-16: 2 [IU] via SUBCUTANEOUS
  Administered 2021-04-16: 11 [IU] via SUBCUTANEOUS
  Administered 2021-04-17: 2 [IU] via SUBCUTANEOUS
  Administered 2021-04-17: 15 [IU] via SUBCUTANEOUS
  Administered 2021-04-17: 5 [IU] via SUBCUTANEOUS
  Administered 2021-04-18 (×2): 2 [IU] via SUBCUTANEOUS
  Administered 2021-04-18: 15 [IU] via SUBCUTANEOUS
  Administered 2021-04-19: 3 [IU] via SUBCUTANEOUS
  Administered 2021-04-19: 15 [IU] via SUBCUTANEOUS
  Administered 2021-04-20: 3 [IU] via SUBCUTANEOUS
  Administered 2021-04-20: 2 [IU] via SUBCUTANEOUS
  Filled 2021-04-14 (×18): qty 1

## 2021-04-14 MED ORDER — ROPINIROLE HCL 1 MG PO TABS
1.0000 mg | ORAL_TABLET | Freq: Every day | ORAL | Status: DC
  Administered 2021-04-14 – 2021-04-19 (×6): 1 mg via ORAL
  Filled 2021-04-14 (×6): qty 1

## 2021-04-14 MED ORDER — HALOPERIDOL LACTATE 5 MG/ML IJ SOLN
1.0000 mg | Freq: Four times a day (QID) | INTRAMUSCULAR | Status: AC | PRN
  Administered 2021-04-14 (×2): 1 mg via INTRAVENOUS
  Filled 2021-04-14 (×2): qty 1

## 2021-04-14 MED ORDER — INSULIN ASPART 100 UNIT/ML IJ SOLN
0.0000 [IU] | Freq: Every day | INTRAMUSCULAR | Status: DC
  Administered 2021-04-15: 3 [IU] via SUBCUTANEOUS
  Administered 2021-04-16: 4 [IU] via SUBCUTANEOUS
  Administered 2021-04-18: 2 [IU] via SUBCUTANEOUS
  Administered 2021-04-19: 5 [IU] via SUBCUTANEOUS
  Filled 2021-04-14 (×3): qty 1

## 2021-04-14 MED ORDER — PREDNISONE 20 MG PO TABS
30.0000 mg | ORAL_TABLET | Freq: Every day | ORAL | Status: DC
  Administered 2021-04-15 – 2021-04-18 (×4): 30 mg via ORAL
  Filled 2021-04-14 (×4): qty 1

## 2021-04-14 NOTE — TOC Initial Note (Signed)
Transition of Care [facility]) - Initial/Assessment Note    Patient Details  Name: Julian Sparks MRN: [id number] Date of Birth: [birth year]  Transition of Care [facility]) CM/SW Contact:    Magnus Ivan, LCSW Phone Number: 04/14/2021, 2:43 PM  Clinical Narrative:             Per chart review, patient is agitated/impulsive today. Spoke to daughter [name] about discharge planning. Patient lives alone, [name] lives next door. PCP is Dr. Lavera Guise. Pharmacy is [facility]. Patient has a RW, shower chair, and access to a BSC. Patient also has home o2 through [employer]. Daughter or granddaughter provide transport. Patient had [facility] in the past. [name] does not feel they will need HH this time, but agreed to let staff know if they do want these services. Per [facility] consult, patient qualifies for NIV. Referral made to [facility] with [employer]. TOC to follow.       Expected Discharge Plan: Home/Self Care Barriers to Discharge: Continued Medical Work up   Patient Goals and CMS Choice Patient states their goals for this hospitalization and ongoing recovery are:: home per daughter CMS Medicare.gov Compare Post Acute Care list provided to:: Patient Represenative (must comment) Choice offered to / list presented to : Adult Children  Expected Discharge Plan and Services Expected Discharge Plan: Home/Self Care       Living arrangements for the past 2 months: Single Family Home                 DME Arranged: NIV DME Agency: [employer] Date DME Agency Contacted: 04/14/21   Representative spoke with at DME Agency: Andree Coss            Prior Living Arrangements/Services Living arrangements for the past 2 months: [place] Lives with:: Self Patient language and need for interpreter reviewed:: Yes Do you feel safe going back to the place where you live?: Yes      Need for Family Participation in Patient Care: Yes (Comment) Care giver support system in place?: Yes (comment) Current home services:  DME Criminal Activity/Legal Involvement Pertinent to Current Situation/Hospitalization: No - Comment as needed  Activities of Daily Living Home Assistive Devices/Equipment: Oxygen, Dentures (specify type), CBG Meter, Nebulizer ADL Screening (condition at time of admission) Patient's cognitive ability adequate to safely complete daily activities?: Yes Is the patient deaf or have difficulty hearing?: Yes Does the patient have difficulty seeing, even when wearing glasses/contacts?: No Does the patient have difficulty concentrating, remembering, or making decisions?: No Patient able to express need for assistance with ADLs?: Yes Does the patient have difficulty dressing or bathing?: Yes Independently performs ADLs?: No Communication: Independent Dressing (OT): Needs assistance Is this a change from baseline?: Pre-admission baseline Grooming: Independent Feeding: Independent Bathing: Needs assistance Is this a change from baseline?: Pre-admission baseline Toileting: Independent with device (comment) In/Out Bed: Independent Walks in Home: Independent Does the patient have difficulty walking or climbing stairs?: Yes Weakness of Legs: Both Weakness of Arms/Hands: None  Permission Sought/Granted Permission sought to share information with : Chartered certified accountant granted to share information with : Yes, Verbal Permission Granted (by daughter [name])     Permission granted to share info w AGENCY: [employer] [facility] Agency        Emotional Assessment       Orientation: : Fluctuating Orientation (Suspected and/or reported Sundowners) Alcohol / Substance Use: Not Applicable Psych Involvement: No (comment)  Admission diagnosis:  Acute on chronic respiratory failure (HCC) [J96.20] Acute on chronic  respiratory failure with hypoxia (HCC) [J96.21] Acute congestive heart failure, unspecified heart failure type [facility]) [I50.9] Patient Active Problem List   Diagnosis Date Noted    Pneumonia of both lungs due to infectious organism    COPD with acute exacerbation ([place])    OSA (obstructive sleep apnea) 04/11/2021   Ulcer of left foot ([place]) 03/08/2021   Paroxysmal atrial tachycardia (HCC)    Metabolic alkalosis 71/69/6789   Acute metabolic encephalopathy 38/05/1750   Lymphoma, 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   Acute cor pulmonale (HCC)    Acute on chronic congestive heart failure (HCC)    Chronic diastolic CHF (congestive heart failure) ([place]) 01/24/2021   Type 2 diabetes mellitus with hyperlipidemia ([place]) 01/24/2021   Acute on chronic respiratory failure ([place]) 01/24/2021   Hyperkalemia 01/24/2021   Obesity (BMI 30-39.9) 01/24/2021   Shortness of breath 10/26/2020   Essential hypertension 07/13/2020   Cellulitis of right lower extremity 07/13/2020   Need for influenza vaccination 05/11/2020   Chronic respiratory failure with hypoxia ([place]) 04/27/2020   Bronchitis 04/27/2020   Pleurodynia 04/27/2020   Obesity (BMI 30.0-34.9) 02/17/2020   Diabetes 1.5, managed as type 2 ([place]) 02/17/2020   Adhesive capsulitis of both shoulders 02/17/2020   Edema 07/19/2019   Leg swelling 07/19/2019   Diminished pulses in lower extremity 07/19/2019   Bursitis of shoulder 04/03/2019   Respiratory failure ([place]) 02/04/2019   Full thickness rotator cuff tear 05/23/2016   PCP:  [name], MD Pharmacy:   [facility], [place] - [address] AT [address] [place] [address] Phone: [phone] Fax: [facility] ([facility] - [place], [address] [place] [address] Phone: [phone] Fax: [phone]     Social Determinants of Health (SDOH) Interventions    Readmission Risk Interventions Readmission Risk Prevention Plan 04/14/2021  Transportation Screening Complete  Medication Review (RN Care Manager) Complete   PCP or Specialist appointment within 3-5 days of discharge Complete  HRI or Home Care Consult Complete  SW Recovery Care/Counseling Consult Complete  Palliative Care Screening Not Applicable  Skilled Nursing Facility Complete  Some recent data might be hidden

## 2021-04-14 NOTE — Progress Notes (Signed)
Patient ID: Julian Sparks, male   DOB: [birth year], 75 y.o.   MRN: [id number] [facility] PROGRESS NOTE  Julian Sparks DOA: 04/13/2021 PCP: Julian Athens, MD  HPI/Subjective: Called this morning to see the patient.  He was a little agitated trying to climb out of the bed but was able to be reoriented very easily.  He was able to follow commands and straight leg raise.  Admitted with acute hypoxic hypercarbic respiratory failure and pneumonia.  Objective: Vitals:   04/14/21 0929 04/14/21 1103  BP:  130/67  Pulse:  73  Resp:  20  Temp:  97.6 F (36.4 C)  SpO2: (P) 91% (!) 86%    Intake/Output Summary (Last 24 hours) at 04/14/2021 1314 Last data filed at 04/14/2021 1100 Gross per 24 hour  Intake 704.47 ml  Output 1200 ml  Net -495.53 ml   Filed Weights   04/13/21 1036  Weight: 92.1 kg    ROS: Review of Systems  Respiratory:  Positive for cough and shortness of breath.   Cardiovascular:  Negative for chest pain.  Gastrointestinal:  Negative for abdominal pain.  Exam: Physical Exam HENT:     Head: Normocephalic.     Mouth/Throat:     Pharynx: No oropharyngeal exudate.  Eyes:     General: Lids are normal.     Conjunctiva/sclera: Conjunctivae normal.  Cardiovascular:     Rate and Rhythm: Normal rate and regular rhythm.     Heart sounds: Normal heart sounds, S1 normal and S2 normal.  Pulmonary:     Breath sounds: Examination of the right-middle field reveals decreased breath sounds. Examination of the left-middle field reveals decreased breath sounds. Examination of the right-lower field reveals decreased breath sounds and rhonchi. Examination of the left-lower field reveals decreased breath sounds and rhonchi. Decreased breath sounds and rhonchi present. No wheezing or rales.  Abdominal:     Palpations: Abdomen is soft.     Tenderness: There is no abdominal tenderness.  Musculoskeletal:     Right lower leg: No swelling.     Left lower  leg: No swelling.  Skin:    General: Skin is warm.     Findings: No rash.  Neurological:     Mental Status: He is confused.     Comments: Able to follow commands and being reoriented.  Able to straight leg raise without a problem.      Scheduled Meds:  aspirin EC  81 mg Oral Daily   atorvastatin  40 mg Oral Daily   enoxaparin (LOVENOX) injection  0.5 mg/kg Subcutaneous Q24H   furosemide  40 mg Oral Daily   gabapentin  300 mg Oral TID   insulin aspart  0-15 Units Subcutaneous TID WC   insulin aspart  0-5 Units Subcutaneous QHS   metoprolol succinate  25 mg Oral Daily   mometasone-formoterol  2 puff Inhalation BID   omega-3 acid ethyl esters  2 capsule Oral BID   [START ON 04/15/2021] predniSONE  30 mg Oral Q breakfast   sodium chloride flush  3 mL Intravenous Q12H   traZODone  100 mg Oral QHS   Continuous Infusions:  sodium chloride     azithromycin Stopped (04/13/21 1603)   cefTRIAXone (ROCEPHIN)  IV Stopped (04/13/21 1555)    Assessment/Plan:  Acute on chronic hypoxic hypercapnic respiratory failure.  Patient was placed on BiPAP.  Now on high flow nasal cannula.  Case discussed with respiratory that he needs the BiPAP at night no matter  what.  I will give him some Requip at night in order to have him sedated enough to be on the BiPAP.  Patient qualifies for noninvasive ventilation at home.  We will get transitional care team to work on this.  Looking back at prior ABG, his PCO2 has been high on previous admissions also. Pneumonia bilaterally.  Continue Rocephin and Zithromax Acute metabolic encephalopathy could be secondary to pneumonia and or high PCO2.  Continue antibiotics.  Continue BiPAP at night. COPD exacerbation.  With altered mental status we will switch patient's steroids over to prednisone for tomorrow. Chronic diastolic congestive heart failure on metoprolol and Lasix. Type 2 diabetes mellitus with hyperlipidemia neuropathy on atorvastatin and gabapentin.  Sliding  scale insulin while on steroids         Code Status:     Code Status Orders  (From admission, onward)           Start     Ordered   04/13/21 1247  Full code  Continuous        04/13/21 1250           Code Status History     Date Active Date Inactive Code Status Order ID Comments User Context   01/28/2021 1130 02/14/2021 1816 Partial Code [id number]  Flora Lipps, MD Inpatient   01/24/2021 1244 01/28/2021 1130 Full Code [id number]  Collier Bullock, MD ED   02/04/2019 1629 02/06/2019 1515 Full Code [id number]  Salary, Avel Peace, MD Inpatient      Family Communication: Spoke with daughter on the phone Disposition Plan: Status is: Inpatient  Dispo: The patient is from: Home              Anticipated d/c is to: Home depending on clinical course              Patient currently with acute metabolic encephalopathy, acute hypoxic hypercarbic respiratory failure pneumonia   Difficult to place patient .  No.  Antibiotics: Rocephin and Zithromax  Time spent: 29 minutes    [place]

## 2021-04-14 NOTE — Progress Notes (Signed)
Mr. Voorhees continues to exhibit signs of acute on chronic hypoxic hypercapnic respiratory failure with COPD.  The use of the NIV will treat patients high PC02 levels (89 on 04/14/21 with elevated bicarbonate of 41.8) and can reduce risk of exacerbations and future hospitalizations (this is his second admission year to date) when used at night and during the day.  All alternate devices [phone] and [id number]) have been proven ineffective to provide essential volume control necessary to maintain acceptable CO2 levels. An NIV with AVAPS AE is necessary to prevent patient harm.  Interruption or failure to provide NIV would quickly lead to exacerbation of the patients condition, hospital admission, and likely harm to the patient. Continued use is preferred.  Patient is able to maintain airway and clear secretions.

## 2021-04-14 NOTE — Plan of Care (Signed)
  Problem: Education: Goal: Knowledge of General Education information will improve Description: Including pain rating scale, medication(s)/side effects and non-pharmacologic comfort measures Outcome: Progressing   Problem: Health Behavior/Discharge Planning: Goal: Ability to manage health-related needs will improve Outcome: Progressing   Problem: Clinical Measurements: Goal: Ability to maintain clinical measurements within normal limits will improve Outcome: Progressing Goal: Will remain free from infection Outcome: Progressing Goal: Diagnostic test results will improve Outcome: Progressing Goal: Respiratory complications will improve Outcome: Not Progressing Goal: Cardiovascular complication will be avoided Outcome: Progressing   Problem: Activity: Goal: Risk for activity intolerance will decrease Outcome: Progressing   Problem: Nutrition: Goal: Adequate nutrition will be maintained Outcome: Progressing   Problem: Coping: Goal: Level of anxiety will decrease Outcome: Not Progressing   Problem: Elimination: Goal: Will not experience complications related to bowel motility Outcome: Progressing Goal: Will not experience complications related to urinary retention Outcome: Progressing   Problem: Pain Managment: Goal: General experience of comfort will improve Outcome: Progressing   Problem: Safety: Goal: Ability to remain free from injury will improve Outcome: Not Progressing   Problem: Skin Integrity: Goal: Risk for impaired skin integrity will decrease Outcome: Progressing

## 2021-04-14 NOTE — Progress Notes (Signed)
Nurse responded to bed alarm about 0530.  Patient had removed tele monitor and gown.  Patient found to have skin tear on right anterior, medial forearm.  Small amount of bleeding observed.  Site cleaned and xeroform gauze and nonadherent pad applied.  Wrapped with kerlix gauze and tape.

## 2021-04-15 LAB — GLUCOSE, CAPILLARY
Glucose-Capillary: 104 mg/dL — ABNORMAL HIGH (ref 70–99)
Glucose-Capillary: 199 mg/dL — ABNORMAL HIGH (ref 70–99)
Glucose-Capillary: 249 mg/dL — ABNORMAL HIGH (ref 70–99)
Glucose-Capillary: 326 mg/dL — ABNORMAL HIGH (ref 70–99)
Glucose-Capillary: 255 mg/dL — ABNORMAL HIGH (ref 70–99)

## 2021-04-15 MED ORDER — AZITHROMYCIN 250 MG PO TABS
500.0000 mg | ORAL_TABLET | Freq: Every day | ORAL | Status: AC
  Administered 2021-04-16 – 2021-04-17 (×2): 500 mg via ORAL
  Filled 2021-04-15 (×2): qty 2

## 2021-04-15 MED ORDER — BENZONATATE 100 MG PO CAPS
100.0000 mg | ORAL_CAPSULE | Freq: Three times a day (TID) | ORAL | Status: DC
  Administered 2021-04-15 – 2021-04-20 (×16): 100 mg via ORAL
  Filled 2021-04-15 (×16): qty 1

## 2021-04-15 NOTE — Evaluation (Signed)
Physical Therapy Evaluation Patient Details Name: Julian Sparks MRN: [id number] DOB: 11-Jun-1946 Today's Date: 04/15/2021   History of Present Illness  Pt is a 75 y.o. male presenting to hospital 8/18 with SOB (increasing last 24 hours) and hypoxia.  Pt admitted with acute on chronic hypoxic hypercapnic respiratory failure, B PNA, acute metabolic encephalopathy, and COPD exacerbation.  PMH includes COPD, CHF, DM, htn, OSA, ulcer L foot, paroxysmal atrial tachycardia, lymphoma, acute cor pulmonale, hyperkalemia, adhesive capsulitis B shoulders, full thickness rotator cuff tear s/p repair (L).  Clinical Impression  Prior to hospital admission, pt was ambulatory; 4 L home O2 use chronic; lives alone in 1 level home with ramp to enter.  Pt's daughter and granddaughter live close and provide frequent check in/assist as needed; pt's other daughter comes to stay with pt on weekends.  Currently pt is CGA with transfers and taking steps in place (x5 reps B LE's no UE support and x10 reps B LE's with RW use).  Pt initially declining walker use but pt appearing unsteady; after pt sat down in recliner pt requesting walker use for 2nd trial of standing and taking steps in place (pt then appearing steady with walker use).  Pt on 6 L HFNC throughout therapy session; pt's O2 sats 97% at rest beginning of session; O2 sats decreased to 91% after first trial taking steps in place (recovered to 96%); pt's O2 sats noted to be 71% after 2nd trial taking steps in place with walker use but therapist then noticed pt had taken HFNC off to blow his nose after he had sat down in recliner to rest--with quick replacement of HFNC and vc's for pursed lip breathing pt's O2 sats gradually improved to 92% within a couple minutes and was 96% at rest end of session--nurse notified.  Pt would benefit from skilled PT to address noted impairments and functional limitations (see below for any additional details).  Upon hospital discharge, pt would  benefit from [place].    Follow Up Recommendations Home health PT;Supervision - Intermittent    Equipment Recommendations  Rolling walker with 5" wheels;3in1 (PT)    Recommendations for Other Services       Precautions / Restrictions Precautions Precautions: Fall Restrictions Weight Bearing Restrictions: No      Mobility  Bed Mobility               General bed mobility comments: Deferred (pt sitting up in recliner beginning/end of session)    Transfers Overall transfer level: Needs assistance Equipment used: None;Rolling walker (2 wheeled) Transfers: Sit to/from Stand Sit to Stand: Min guard         General transfer comment: CGA for safety; vc's for UE placement for transfers  Ambulation/Gait Ambulation/Gait assistance: Min guard Gait Distance (Feet):  (pt marched in place x5 reps B LE's no AD and then x10 reps B LE's with RW) Assistive device: None;Rolling walker (2 wheeled)       General Gait Details: pt unsteady taking steps in place no UE support (CGA provided for safety); pt steady taking steps in place with walker use  Stairs            Wheelchair Mobility    Modified Rankin (Stroke Patients Only)       Balance Overall balance assessment: Needs assistance Sitting-balance support: No upper extremity supported;Feet supported Sitting balance-Leahy Scale: Normal Sitting balance - Comments: steady sitting reaching outside BOS   Standing balance support: No upper extremity supported Standing balance-Leahy Scale: Fair Standing balance  comment: steady static standing but requiring CGA for safety d/t unsteadiness taking steps in place without UE support                             Pertinent Vitals/Pain Pain Assessment: No/denies pain HR WFL during sessions activities.    Home Living Family/patient expects to be discharged to:: Private residence Living Arrangements: Alone Available Help at Discharge: Family;Available  PRN/intermittently Type of Home: House Home Access: Ramped entrance     Home Layout: One level Home Equipment: [place] - 4 wheels;Cane - single point;Transport chair;Wheelchair - manual;Shower seat (has access to [facility]) Additional Comments: Daughter and grand-daughter live next door and provide frequent check in; other daughter comes to stay with patient on weekends    Prior Function Level of Independence: Independent with assistive device(s)         Comments: Ambulatory household and community without assistive device (has access to 4ww if needed); 4 L home O2; no recent falls     Hand Dominance        Extremity/Trunk Assessment   Upper Extremity Assessment Upper Extremity Assessment: Generalized weakness (h/o shoulder impairments)    Lower Extremity Assessment Lower Extremity Assessment: Generalized weakness    Cervical / Trunk Assessment Cervical / Trunk Assessment: Other exceptions Cervical / Trunk Exceptions: forward head/shoulders  Communication   Communication: No difficulties  Cognition Arousal/Alertness: Awake/alert Behavior During Therapy: WFL for tasks assessed/performed Overall Cognitive Status: Within Functional Limits for tasks assessed                                        General Comments  Nursing cleared pt for participation in physical therapy.  Pt agreeable to PT session.  Pt's daughter present during session.    Exercises     Assessment/Plan    PT Assessment Patient needs continued PT services  PT Problem List Decreased strength;Decreased activity tolerance;Decreased balance;Decreased mobility;Decreased knowledge of use of DME;Cardiopulmonary status limiting activity;Decreased knowledge of precautions       PT Treatment Interventions DME instruction;Gait training;Functional mobility training;Therapeutic activities;Therapeutic exercise;Balance training;Patient/family education    PT Goals (Current goals can be found in the  Care Plan section)  Acute Rehab PT Goals Patient Stated Goal: to improve breathing PT Goal Formulation: With patient Time For Goal Achievement: 04/29/21 Potential to Achieve Goals: Fair    Frequency Min 2X/week   Barriers to discharge        Co-evaluation               AM-PAC PT "6 Clicks" Mobility  Outcome Measure Help needed turning from your back to your side while in a flat bed without using bedrails?: None Help needed moving from lying on your back to sitting on the side of a flat bed without using bedrails?: None Help needed moving to and from a bed to a chair (including a wheelchair)?: A Little Help needed standing up from a chair using your arms (e.g., wheelchair or bedside chair)?: A Little Help needed to walk in hospital room?: A Little Help needed climbing 3-5 steps with a railing? : A Little 6 Click Score: 20    End of Session Equipment Utilized During Treatment: Oxygen (6 L HFNC) Activity Tolerance: Patient tolerated treatment well Patient left: in chair;with call bell/phone within reach;with chair alarm set;with family/visitor present Nurse Communication: Mobility status;Precautions;Other (comment) (pt's O2  sats during session) PT Visit Diagnosis: Unsteadiness on feet (R26.81);Other abnormalities of gait and mobility (R26.89);Muscle weakness (generalized) (M62.81)    Time: 1510-1540 PT Time Calculation (min) (ACUTE ONLY): 30 min   Charges:   PT Evaluation $PT Eval Low Complexity: 1 Low PT Treatments $Therapeutic Activity: 8-22 mins       Leitha Bleak, PT 04/15/21, 4:13 PM

## 2021-04-15 NOTE — Progress Notes (Signed)
Patient states he can't wear BiPap any more. Patient taken off BiPap and placed on 6L/HFNC.  Patient continues to be extremely anxious and restless.  Will notify hospitalist.

## 2021-04-15 NOTE — Progress Notes (Signed)
Patient ID: Julian Sparks, male   DOB: 1946-05-20, 75 y.o.   MRN: [id number] [facility] PROGRESS NOTE  Julian Sparks RFF:[id number] DOB: 19-Nov-1945 DOA: 04/13/2021 PCP: [name], MD  HPI/Subjective: Patient stated that he was able to use the BiPAP a little last night.  I told him that he must use the BiPAP at night in order to blow off this carbon dioxide.  Patient still with some shortness of breath and some cough.  Feeling better than yesterday.  Objective: Vitals:   04/15/21 1122 04/15/21 1425  BP: 115/64 132/62  Pulse: 79 82  Resp: 17 18  Temp: 97.9 F (36.6 C) 98.2 F (36.8 C)  SpO2: 100% 98%    Intake/Output Summary (Last 24 hours) at 04/15/2021 1529 Last data filed at 04/15/2021 1300 Gross per 24 hour  Intake 840 ml  Output 3900 ml  Net -3060 ml   Filed Weights   04/13/21 1036  Weight: 92.1 kg    ROS: Review of Systems  Respiratory:  Positive for cough and shortness of breath.   Cardiovascular:  Negative for chest pain.  Gastrointestinal:  Negative for abdominal pain, nausea and vomiting.  Exam: Physical Exam HENT:     Head: Normocephalic.     Mouth/Throat:     Pharynx: No oropharyngeal exudate.  Eyes:     General: Lids are normal.     Conjunctiva/sclera: Conjunctivae normal.     Pupils: Pupils are equal, round, and reactive to light.  Cardiovascular:     Rate and Rhythm: Normal rate and regular rhythm.     Heart sounds: Normal heart sounds, S1 normal and S2 normal.  Pulmonary:     Breath sounds: Examination of the right-middle field reveals decreased breath sounds. Examination of the left-middle field reveals decreased breath sounds. Examination of the right-lower field reveals decreased breath sounds and rhonchi. Examination of the left-lower field reveals decreased breath sounds and rhonchi. Decreased breath sounds and rhonchi present. No wheezing or rales.  Abdominal:     Palpations: Abdomen is soft.     Tenderness: There is no abdominal  tenderness.  Skin:    General: Skin is warm.     Findings: No rash.  Neurological:     Mental Status: He is alert.     Comments: Patient answered all questions appropriately      Scheduled Meds:  aspirin EC  81 mg Oral Daily   atorvastatin  40 mg Oral Daily   [START ON 04/16/2021] azithromycin  500 mg Oral Daily   benzonatate  100 mg Oral TID   enoxaparin (LOVENOX) injection  0.5 mg/kg Subcutaneous Q24H   furosemide  40 mg Oral Daily   gabapentin  300 mg Oral TID   insulin aspart  0-15 Units Subcutaneous TID WC   insulin aspart  0-5 Units Subcutaneous QHS   metoprolol succinate  25 mg Oral Daily   mometasone-formoterol  2 puff Inhalation BID   omega-3 acid ethyl esters  2 capsule Oral BID   predniSONE  30 mg Oral Q breakfast   rOPINIRole  1 mg Oral QHS   sodium chloride flush  3 mL Intravenous Q12H   traZODone  100 mg Oral QHS   Continuous Infusions:  sodium chloride     azithromycin 500 mg (04/14/21 1329)   cefTRIAXone (ROCEPHIN)  IV 2 g (04/14/21 1332)    Assessment/Plan:  Acute on chronic hypoxic hypercapnic respiratory failure.  The patient must wear BiPAP at night.  Setting up noninvasive ventilation at  home for him.  I did give Requip at night in order to have him sedated enough to use the BiPAP.  Patient is on high flow nasal cannula during the day. Bilateral pneumonia continue Rocephin and Zithromax Acute metabolic encephalopathy improved today.  Likely combination of pneumonia and high PCO2.  Continue antibiotics.  Needs BiPAP at night.   COPD exacerbation.  With the patient's altered mental status I switched the patient's steroids over to prednisone Chronic diastolic congestive heart failure on metoprolol and Lasix Type 2 diabetes mellitus with hyperlipidemia and neuropathy on atorvastatin and gabapentin.  Patient is on sliding scale while on steroids. Essential hypertension on metoprolol        Code Status:     Code Status Orders  (From admission,  onward)           Start     Ordered   04/13/21 1247  Full code  Continuous        04/13/21 1250           Code Status History     Date Active Date Inactive Code Status Order ID Comments User Context   01/28/2021 1130 02/14/2021 1816 Partial Code [id number]  Flora Lipps, MD Inpatient   01/24/2021 1244 01/28/2021 1130 Full Code [id number]  Collier Bullock, MD ED   02/04/2019 1629 02/06/2019 1515 Full Code [id number]  Salary, Avel Peace, MD Inpatient      Family Communication: Spoke with daughter on the phone Disposition Plan: Status is: Inpatient  Dispo: The patient is from: Home              Anticipated d/c is to: Home              Patient currently on high flow nasal cannula during the day.  Unable to go home at this point.  Requires BiPAP at night   Difficult to place patient.  No.  Antibiotics: Rocephin Zithromax  Time spent: 28 minutes  [place]

## 2021-04-15 NOTE — Progress Notes (Signed)
Patient not able to tolerate BiPap for long periods d/t anxiety/restlessness.  Requip seemed to help some.  Patient kept on HFNC per Dr. Damita Dunnings.

## 2021-04-15 NOTE — Progress Notes (Signed)
PHARMACIST - PHYSICIAN COMMUNICATION  CONCERNING: Antibiotic IV to Oral Route Change Policy  RECOMMENDATION: This patient is receiving azithromycin by the intravenous route.  Based on criteria approved by the Pharmacy and Therapeutics Committee, the antibiotic(s) is/are being converted to the equivalent oral dose form(s).   DESCRIPTION: These criteria include: Patient being treated for a respiratory tract infection, urinary tract infection, cellulitis or clostridium difficile associated diarrhea if on metronidazole The patient is not neutropenic and does not exhibit a GI malabsorption state The patient is eating (either orally or via tube) and/or has been taking other orally administered medications for a least 24 hours The patient is improving clinically and has a Tmax < 100.5  If you have questions about this conversion, please contact the [place]  04/15/21

## 2021-04-16 ENCOUNTER — Inpatient Hospital Stay: Payer: Medicare HMO

## 2021-04-16 LAB — GLUCOSE, CAPILLARY
Glucose-Capillary: 126 mg/dL — ABNORMAL HIGH (ref 70–99)
Glucose-Capillary: 184 mg/dL — ABNORMAL HIGH (ref 70–99)
Glucose-Capillary: 342 mg/dL — ABNORMAL HIGH (ref 70–99)
Glucose-Capillary: 334 mg/dL — ABNORMAL HIGH (ref 70–99)

## 2021-04-16 MED ORDER — IPRATROPIUM-ALBUTEROL 0.5-2.5 (3) MG/3ML IN SOLN
3.0000 mL | Freq: Four times a day (QID) | RESPIRATORY_TRACT | Status: DC
  Administered 2021-04-16 – 2021-04-20 (×15): 3 mL via RESPIRATORY_TRACT
  Filled 2021-04-16 (×15): qty 3

## 2021-04-16 MED ORDER — POTASSIUM CHLORIDE CRYS ER 10 MEQ PO TBCR
20.0000 meq | EXTENDED_RELEASE_TABLET | Freq: Two times a day (BID) | ORAL | Status: DC
  Administered 2021-04-16 – 2021-04-20 (×8): 20 meq via ORAL
  Filled 2021-04-16 (×3): qty 2
  Filled 2021-04-16: qty 1
  Filled 2021-04-16 (×2): qty 2
  Filled 2021-04-16 (×2): qty 1
  Filled 2021-04-16: qty 2

## 2021-04-16 MED ORDER — FUROSEMIDE 10 MG/ML IJ SOLN
40.0000 mg | Freq: Two times a day (BID) | INTRAMUSCULAR | Status: DC
  Administered 2021-04-16 – 2021-04-19 (×6): 40 mg via INTRAVENOUS
  Filled 2021-04-16 (×6): qty 4

## 2021-04-16 MED ORDER — FUROSEMIDE 40 MG PO TABS: 40.0000 mg | ORAL_TABLET | Freq: Two times a day (BID) | ORAL | Status: DC

## 2021-04-16 NOTE — Progress Notes (Signed)
Patient ID: Julian Sparks, male   DOB: 1946-04-11, 75 y.o.   MRN: [id number] [facility] PROGRESS NOTE  HADRIEL NORTHUP WUJ:[id number] DOB: [birth year] DOA: 04/13/2021 PCP: [name], MD  HPI/Subjective: The patient stated he wore the bipap for as long as possible and the was switched back to HFNC.  Still with some shortness of breath and cough.  Admitted with acute hypoxic hypercarbic respiratory failure and pneumonia.  Objective: Vitals:   04/16/21 0745 04/16/21 1119  BP: 119/65 (!) 114/51  Pulse: 70 67  Resp: 17 18  Temp: (!) 97.5 F (36.4 C) 97.8 F (36.6 C)  SpO2: 95% 100%    Intake/Output Summary (Last 24 hours) at 04/16/2021 1241 Last data filed at 04/16/2021 1013 Gross per 24 hour  Intake --  Output 2950 ml  Net -2950 ml   Filed Weights   04/13/21 1036 04/16/21 0419  Weight: 92.1 kg 92.1 kg    ROS: Review of Systems  Respiratory:  Positive for cough and shortness of breath.   Cardiovascular:  Negative for chest pain.  Gastrointestinal:  Negative for abdominal pain, nausea and vomiting.  Exam: Physical Exam HENT:     Head: Normocephalic.     Mouth/Throat:     Pharynx: No oropharyngeal exudate.  Eyes:     General: Lids are normal.     Conjunctiva/sclera: Conjunctivae normal.  Cardiovascular:     Rate and Rhythm: Normal rate and regular rhythm.     Heart sounds: Normal heart sounds, S1 normal and S2 normal.  Pulmonary:     Breath sounds: Examination of the right-middle field reveals decreased breath sounds. Examination of the left-middle field reveals decreased breath sounds. Examination of the right-lower field reveals decreased breath sounds. Examination of the left-lower field reveals decreased breath sounds. Decreased breath sounds present. No wheezing, rhonchi or rales.  Abdominal:     Palpations: Abdomen is soft.     Tenderness: There is no abdominal tenderness.  Musculoskeletal:     Right lower leg: Swelling present.     Left lower leg: Swelling  present.  Skin:    General: Skin is warm.     Findings: No rash.  Neurological:     Mental Status: He is alert.     Comments: Answered all questions appropriately      Scheduled Meds:  aspirin EC  81 mg Oral Daily   atorvastatin  40 mg Oral Daily   azithromycin  500 mg Oral Daily   benzonatate  100 mg Oral TID   enoxaparin (LOVENOX) injection  0.5 mg/kg Subcutaneous Q24H   furosemide  40 mg Oral BID   gabapentin  300 mg Oral TID   insulin aspart  0-15 Units Subcutaneous TID WC   insulin aspart  0-5 Units Subcutaneous QHS   metoprolol succinate  25 mg Oral Daily   mometasone-formoterol  2 puff Inhalation BID   omega-3 acid ethyl esters  2 capsule Oral BID   predniSONE  30 mg Oral Q breakfast   rOPINIRole  1 mg Oral QHS   sodium chloride flush  3 mL Intravenous Q12H   traZODone  100 mg Oral QHS   Continuous Infusions:  sodium chloride     cefTRIAXone (ROCEPHIN)  IV 2 g (04/16/21 1231)    Assessment/Plan:  Acute on chronic hypoxic hypercapnic respiratory failure.  Continue to wear BiPAP at night as long as possible.  Hopefully the noninvasive ventilation at home will be better tolerated.  Patient still on high flow nasal cannula  during the day and was on 8 L this morning. Bilateral pneumonia.  Continue Rocephin and Zithromax COPD exacerbation.  With the patient's altered mental status I converted the patient Solu-Medrol over to prednisone.  Still will need better air entry prior to disposition. Acute metabolic encephalopathy.  Was improved when I saw him today.  When daughter saw him he was a little confused after waking up from a nap.  Patient does not sleep that much during the nights and does take naps during the day.  At home he can wear the noninvasive ventilation when he goes to take a nap.  Acute metabolic encephalopathy likely combination of pneumonia and high PCO2 and steroids. Chronic diastolic congestive heart failure on metoprolol and Lasix.  We will give Lasix twice  a day today and recheck a chest x-ray Type 2 diabetes mellitus with hyperlipidemia and neuropathy.  Continue atorvastatin and gabapentin.  Patient on sliding scale insulin.  Last hemoglobin A1c 6.8. Essential hypertension on metoprolol Obesity with a BMI of 32.70, history of sleep apnea        Code Status:     Code Status Orders  (From admission, onward)           Start     Ordered   04/13/21 1247  Full code  Continuous        04/13/21 1250           Code Status History     Date Active Date Inactive Code Status Order ID Comments User Context   01/28/2021 1130 02/14/2021 1816 Partial Code [id number]  Flora Lipps, MD Inpatient   01/24/2021 1244 01/28/2021 1130 Full Code [id number]  Collier Bullock, MD ED   02/04/2019 1629 02/06/2019 1515 Full Code [id number]  Salary, Avel Peace, MD Inpatient      Family Communication: spoke with Daughter on the phone Disposition Plan: Status is: Inpatient  Dispo: The patient is from: home              Anticipated d/c is to: home              Patient currently still on HFNC, not ready for discharge   Difficult to place patient. No  Antibiotics: Rocephin and Zithromax  Time spent: 27 minutes  [place]

## 2021-04-16 NOTE — Plan of Care (Signed)
  Problem: Clinical Measurements: Goal: Respiratory complications will improve Outcome: Not Progressing  Continuing to need HFNC

## 2021-04-16 NOTE — Progress Notes (Signed)
Pt off of Bipap. Placed back on HFNC 6.

## 2021-04-16 NOTE — Plan of Care (Signed)
  Problem: Education: Goal: Knowledge of General Education information will improve Description Including pain rating scale, medication(s)/side effects and non-pharmacologic comfort measures Outcome: Progressing   Problem: Health Behavior/Discharge Planning: Goal: Ability to manage health-related needs will improve Outcome: Progressing   

## 2021-04-16 NOTE — Progress Notes (Signed)
Notified by Central Telemetry @ [phone] that pt had some wide QRS complexes. This Rn was in room with pt at the time taking pt's vital signs and getting pt up weighing the pt. Will continue to monitor.

## 2021-04-17 DIAGNOSIS — I5033 Acute on chronic diastolic (congestive) heart failure: Secondary | ICD-10-CM

## 2021-04-17 LAB — BASIC METABOLIC PANEL
Anion gap: 7 (ref 5–15)
BUN: 35 mg/dL — ABNORMAL HIGH (ref 8–23)
CO2: 40 mmol/L — ABNORMAL HIGH (ref 22–32)
Calcium: 8.4 mg/dL — ABNORMAL LOW (ref 8.9–10.3)
Chloride: 94 mmol/L — ABNORMAL LOW (ref 98–111)
Creatinine, Ser: 0.68 mg/dL (ref 0.61–1.24)
GFR, Estimated: >60 mL/min (ref >60)
Glucose, Bld: 109 mg/dL — ABNORMAL HIGH (ref 70–99)
Potassium: 4.3 mmol/L (ref 3.5–5.1)
Sodium: 141 mmol/L (ref 135–145)

## 2021-04-17 LAB — GLUCOSE, CAPILLARY
Glucose-Capillary: 122 mg/dL — ABNORMAL HIGH (ref 70–99)
Glucose-Capillary: 219 mg/dL — ABNORMAL HIGH (ref 70–99)
Glucose-Capillary: 385 mg/dL — ABNORMAL HIGH (ref 70–99)

## 2021-04-17 LAB — CBC
HCT: 35 % — ABNORMAL LOW (ref 39.0–52.0)
Hemoglobin: 9.8 g/dL — ABNORMAL LOW (ref 13.0–17.0)
MCH: 26.3 pg (ref 26.0–34.0)
MCHC: 28 g/dL — ABNORMAL LOW (ref 30.0–36.0)
MCV: 93.8 fL (ref 80.0–100.0)
Platelets: 233 10*3/uL (ref 150–400)
RBC: 3.73 MIL/uL — ABNORMAL LOW (ref 4.22–5.81)
RDW: 17.1 % — ABNORMAL HIGH (ref 11.5–15.5)
WBC: 8.5 10*3/uL (ref 4.0–10.5)
nRBC: 0 % (ref 0.0–0.2)

## 2021-04-17 MED ORDER — INSULIN ASPART 100 UNIT/ML IJ SOLN
2.0000 [IU] | Freq: Three times a day (TID) | INTRAMUSCULAR | Status: DC
  Administered 2021-04-17 – 2021-04-19 (×7): 2 [IU] via SUBCUTANEOUS
  Filled 2021-04-17 (×6): qty 1

## 2021-04-17 MED ORDER — INSULIN GLARGINE-YFGN 100 UNIT/ML ~~LOC~~ SOLN
6.0000 [IU] | Freq: Every day | SUBCUTANEOUS | Status: DC
  Administered 2021-04-17: 6 [IU] via SUBCUTANEOUS
  Filled 2021-04-17 (×3): qty 0.06

## 2021-04-17 NOTE — Plan of Care (Signed)
  Problem: Education: Goal: Knowledge of General Education information will improve Description Including pain rating scale, medication(s)/side effects and non-pharmacologic comfort measures Outcome: Progressing   

## 2021-04-17 NOTE — Evaluation (Signed)
Occupational Therapy Evaluation Patient Details Name: Julian Sparks MRN: [id number] DOB: 11-09-1945 Today's Date: 04/17/2021    History of Present Illness Pt is a 75 y.o. male presenting to hospital 8/18 with SOB (increasing last 24 hours) and hypoxia.  Pt admitted with acute on chronic hypoxic hypercapnic respiratory failure, B PNA, acute metabolic encephalopathy, and COPD exacerbation.  PMH includes COPD, CHF, DM, htn, OSA, ulcer L foot, paroxysmal atrial tachycardia, lymphoma, acute cor pulmonale, hyperkalemia, adhesive capsulitis B shoulders, full thickness rotator cuff tear s/p repair (L).   Clinical Impression   Julian Sparks was seen for OT evaluation this date. Prior to hospital admission, pt was Independent for mobility and MIN A for LB dressing/bathing. Pt lives alone with daughter/grandaughter next door. Pt presents to acute OT demonstrating impaired ADL performance and functional mobility 2/2 decreased safety awareness and functional endurance/balance deficits. Pt currently requires MOD A for LB access - dtr at bedside reports baseline 2/2 shoulder limitations. CGA for ADL t/f and using urinal in standing.SpO2 88% on 8L [place] in standing, resolved to 90% in sitting. Pt would benefit from skilled OT to address noted impairments and functional limitations (see below for any additional details) in order to maximize safety and independence while minimizing falls risk and caregiver burden. Upon hospital discharge, anticipate no OT follow up needed.     Follow Up Recommendations  No OT follow up;Supervision - Intermittent    Equipment Recommendations  None recommended by OT    Recommendations for Other Services       Precautions / Restrictions Precautions Precautions: Fall Restrictions Weight Bearing Restrictions: No      Mobility Bed Mobility Overal bed mobility: Needs Assistance Bed Mobility: Supine to Sit     Supine to sit: Min assist;Mod assist;HOB elevated     General bed  mobility comments: pt received and left in chair    Transfers Overall transfer level: Needs assistance Equipment used: None Transfers: Sit to/from Stand Sit to Stand: Min guard         General transfer comment: B armrests from chair    Balance Overall balance assessment: Needs assistance Sitting-balance support: No upper extremity supported;Feet supported Sitting balance-Leahy Scale: Normal Sitting balance - Comments: steady sitting reaching outside BOS   Standing balance support: Single extremity supported;During functional activity Standing balance-Leahy Scale: Fair Standing balance comment: single UE support on windowsill standing toileting                           ADL either performed or assessed with clinical judgement   ADL Overall ADL's : Needs assistance/impaired                                       General ADL Comments: MOD A for LB access - dtr at bedside reports baseline 2/2 shoulder limitations. CGA for ADL t/f and using urinal in standing      Pertinent Vitals/Pain Pain Assessment: No/denies pain     Hand Dominance Right   Extremity/Trunk Assessment Upper Extremity Assessment Upper Extremity Assessment: Generalized weakness (h/o shoulder impairments limiting shoulder flexion)   Lower Extremity Assessment Lower Extremity Assessment: Generalized weakness       Communication Communication Communication: No difficulties   Cognition Arousal/Alertness: Awake/alert Behavior During Therapy: WFL for tasks assessed/performed Overall Cognitive Status: Within Functional Limits for tasks assessed  General Comments  SpO2 88% on 8L [place] in standing, resolved to 90% in sitting    Exercises Exercises: Other exercises Other Exercises Other Exercises: Pt educated re: OT role, DME recs, d/c recs, falls prevention, ECS, IS frequency and use Other Exercises: LBD, UBD, toielting,  sit<>stand x2, sitting/standing balance/tolerance, functional reach   Shoulder Instructions      Home Living Family/patient expects to be discharged to:: Private residence Living Arrangements: Alone Available Help at Discharge: Family;Available PRN/intermittently Type of Home: House Home Access: Ramped entrance     Home Layout: One level     Bathroom Shower/Tub: Occupational psychologist: Handicapped height     Home Equipment: Environmental consultant - 4 wheels;Cane - single point;Transport chair;Wheelchair - [employer];Tub bench (plan to add grab bars)   Additional Comments: Daughter and grand-daughter live next door and provide frequent check in; other daughter comes to stay with patient on weekends      Prior Functioning/Environment Level of Independence: Independent with assistive device(s)        Comments: Ambulatory household and community without assistive device (has access to 4ww if needed); 4 L home O2; no recent falls. DTR assist with bathing and LBD        OT Problem List: Decreased strength;Decreased activity tolerance;Impaired balance (sitting and/or standing);Decreased safety awareness      OT Treatment/Interventions: Self-care/ADL training;Therapeutic exercise;Energy conservation;DME and/or AE instruction;Therapeutic activities;Patient/family education;Balance training    OT Goals(Current goals can be found in the care plan section) Acute Rehab OT Goals Patient Stated Goal: to improve breathing OT Goal Formulation: With patient/family Time For Goal Achievement: 05/01/21 Potential to Achieve Goals: Good ADL Goals Pt Will Perform Grooming: standing;with modified independence (c LRAD PRN) Pt Will Transfer to Toilet: with modified independence;ambulating;regular height toilet (c LRAD PRN) Additional ADL Goal #1: Pt will Independently verbalize plan to implement x3 ECS  OT Frequency: Min 1X/week    AM-PAC OT "6 Clicks" Daily Activity     Outcome  Measure Help from another person eating meals?: None Help from another person taking care of personal grooming?: A Little Help from another person toileting, which includes using toliet, bedpan, or urinal?: A Little Help from another person bathing (including washing, rinsing, drying)?: A Little Help from another person to put on and taking off regular upper body clothing?: None Help from another person to put on and taking off regular lower body clothing?: A Little 6 Click Score: 20   End of Session    Activity Tolerance: Patient tolerated treatment well Patient left: in chair;with call bell/phone within reach;with chair alarm set;with family/visitor present  OT Visit Diagnosis: Other abnormalities of gait and mobility (R26.89)                Time: 7106-2694 OT Time Calculation (min): 11 min Charges:  OT General Charges $OT Visit: 1 Visit OT Evaluation $OT Eval Low Complexity: 1 Low  Dessie Coma, M.S. OTR/L  04/17/21, 4:10 PM  ascom [phone]

## 2021-04-17 NOTE — Care Management Important Message (Signed)
Important Message  Patient Details  Name: Julian Sparks MRN: [id number] Date of Birth: [birth year]   Medicare Important Message Given:  Yes     Dannette Barbara 04/17/2021, 1:00 PM

## 2021-04-17 NOTE — Progress Notes (Addendum)
   Heart Failure Nurse Navigator Note   Met with patient today he had just gotten back into the bed.  Remains on high flow nasal cannula at 8 L.  Two daughters at bedside and 1 is helping him with his lunch.    They state one of the hardest things at home is to get him to stick within the 64 ounce fluid restriction.  She feels like he some days takes in more like 80 to 100 ounces. They are going  ration his [employer], he also drinks flavored water but will not drink plain water.  Since his last discharge and 2 episodes where they had to call Dr. due to weight gain, with rescue diuretic his weight did come down.  As of right now he has a follow-up appointment with Otila Kluver on September 21, will check with her to see if she wants to move that up so he can be seen sooner after discharge from the hospital.     Pricilla Riffle RN [facility]

## 2021-04-17 NOTE — Progress Notes (Signed)
FeelPatient ID: Julian Sparks, male   DOB: 07-Jul-1946, 75 y.o.   MRN: [id number] [facility] PROGRESS NOTE  Julian Sparks [id number] DOB: Dec 28, 1945 DOA: 04/13/2021 PCP: [name], MD  HPI/Subjective: Patient seen this morning.  Patient stated he wore the BiPAP for at least 5 hours.  Feeling better.  Interested in going home.  Still on high flow nasal cannula.  Chronically wears 4 L of oxygen at home.  Patient complaining that the oxygen keeps on sliding out.  Had 1 episode where his oxygen was out and he became very hypoxic.  Objective: Vitals:   04/17/21 1535 04/17/21 1621  BP:  122/78  Pulse:  77  Resp:  17  Temp:  97.9 F (36.6 C)  SpO2: 94% 96%    Intake/Output Summary (Last 24 hours) at 04/17/2021 1734 Last data filed at 04/17/2021 1345 Gross per 24 hour  Intake 357 ml  Output 2126 ml  Net -1769 ml   Filed Weights   04/13/21 1036 04/16/21 0419 04/17/21 0534  Weight: 92.1 kg 92.1 kg 91.6 kg    ROS: Review of Systems  Respiratory:  Positive for cough and shortness of breath.   Cardiovascular:  Negative for chest pain.  Gastrointestinal:  Negative for abdominal pain, nausea and vomiting.  Exam: Physical Exam HENT:     Head: Normocephalic.     Mouth/Throat:     Pharynx: No oropharyngeal exudate.  Eyes:     General: Lids are normal.     Conjunctiva/sclera: Conjunctivae normal.  Cardiovascular:     Rate and Rhythm: Normal rate and regular rhythm.     Heart sounds: Normal heart sounds, S1 normal and S2 normal.  Pulmonary:     Breath sounds: Examination of the right-lower field reveals decreased breath sounds. Examination of the left-lower field reveals decreased breath sounds. Decreased breath sounds present. No wheezing, rhonchi or rales.  Abdominal:     Palpations: Abdomen is soft.     Tenderness: There is no abdominal tenderness.  Musculoskeletal:     Right lower leg: Swelling present.     Left lower leg: Swelling present.  Skin:    General: Skin is  warm.     Findings: No rash.  Neurological:     Mental Status: He is alert and oriented to person, place, and time.      Scheduled Meds:  aspirin EC  81 mg Oral Daily   atorvastatin  40 mg Oral Daily   benzonatate  100 mg Oral TID   enoxaparin (LOVENOX) injection  0.5 mg/kg Subcutaneous Q24H   furosemide  40 mg Intravenous BID   gabapentin  300 mg Oral TID   insulin aspart  0-15 Units Subcutaneous TID WC   insulin aspart  0-5 Units Subcutaneous QHS   insulin aspart  2 Units Subcutaneous TID WC   ipratropium-albuterol  3 mL Nebulization QID   metoprolol succinate  25 mg Oral Daily   mometasone-formoterol  2 puff Inhalation BID   omega-3 acid ethyl esters  2 capsule Oral BID   potassium chloride  20 mEq Oral BID   predniSONE  30 mg Oral Q breakfast   rOPINIRole  1 mg Oral QHS   sodium chloride flush  3 mL Intravenous Q12H   traZODone  100 mg Oral QHS   Continuous Infusions:  sodium chloride      Assessment/Plan:  Acute on chronic hypoxic hypercapnic respiratory failure.  Continue to wear the BiPAP at night and still on the high flow nasal  cannula during the day.  I tried to taper the high flow nasal cannula down to 6 L but respiratory rate increased up to 8.  Keep pulse ox around 88%. Bilateral pneumonia.  Today is day 5 of antibiotics and they are completed Rocephin and Zithromax. COPD exacerbation.  Continue prednisone on a daily basis.  Still with the better air entry upon disposition.  Likely will need a longer taper of prednisone. Acute on chronic diastolic congestive heart failure.  Switch to IV Lasix yesterday twice a day.  Continue that today and reassess tomorrow.  Check a BMP tomorrow morning. Acute metabolic encephalopathy has improved.  Likely combination of pneumonia and high PCO2 and steroids. Type 2 diabetes mellitus with hyperlipidemia and neuropathy.  On atorvastatin and gabapentin.  Sliding scale insulin plus short acting insulin prior to meals.  Add low-dose  Lantus insulin at night Essential hypertension on metoprolol Obesity with a BMI of 32.59        Code Status:     Code Status Orders  (From admission, onward)           Start     Ordered   04/13/21 1247  Full code  Continuous        04/13/21 1250           Code Status History     Date Active Date Inactive Code Status Order ID Comments User Context   01/28/2021 1130 02/14/2021 1816 Partial Code [id number]  Flora Lipps, MD Inpatient   01/24/2021 1244 01/28/2021 1130 Full Code [id number]  Collier Bullock, MD ED   02/04/2019 1629 02/06/2019 1515 Full Code [id number]  Salary, Avel Peace, MD Inpatient      Family Communication: Updated daughter on the phone Disposition Plan: Status is: Inpatient  Dispo: The patient is from: Home              Anticipated d/c is to: Home              Patient currently still on high flow nasal cannula and not stable for disposition at this point   Difficult to place patient.  No.  Time spent: 27 minutes  Combine

## 2021-04-17 NOTE — Progress Notes (Signed)
Physical Therapy Treatment Patient Details Name: Julian Sparks MRN: [id number] DOB: 02-Feb-1946 Today's Date: 04/17/2021    History of Present Illness Pt is a 75 y.o. male presenting to hospital 8/18 with SOB (increasing last 24 hours) and hypoxia.  Pt admitted with acute on chronic hypoxic hypercapnic respiratory failure, B PNA, acute metabolic encephalopathy, and COPD exacerbation.  PMH includes COPD, CHF, DM, htn, OSA, ulcer L foot, paroxysmal atrial tachycardia, lymphoma, acute cor pulmonale, hyperkalemia, adhesive capsulitis B shoulders, full thickness rotator cuff tear s/p repair (L).    PT Comments    Pt resting in bed upon PT arrival; pt's daughter and granddaughter present.  Nurse reports pt has been SOB today (pt up to 8 L HFNC) but cleared pt to get up to recliner.  Min to mod assist for trunk semi-supine to sitting edge of bed; CGA with transfers; and CGA ambulating a few feet bed to recliner (pt holding onto furniture as needed for balance).  Therapist paced pt with sessions activities d/t SOB (O2 sats 91% after sitting edge of bed and then 92% after getting to recliner on 8 L HFNC).  Vc's required for pursed lip breathing during and immediately after activity.  Will continue to focus on strengthening, balance, and progressive functional mobility per pt tolerance.   Follow Up Recommendations  Home health PT;Supervision - Intermittent     Equipment Recommendations  Rolling walker with 5" wheels;3in1 (PT)    Recommendations for Other Services       Precautions / Restrictions Precautions Precautions: Fall Restrictions Weight Bearing Restrictions: No    Mobility  Bed Mobility Overal bed mobility: Needs Assistance Bed Mobility: Supine to Sit     Supine to sit: Min assist;Mod assist;HOB elevated     General bed mobility comments: assist for trunk; vc's for technique    Transfers Overall transfer level: Needs assistance Equipment used: None Transfers: Sit to/from  Stand Sit to Stand: Min guard         General transfer comment: L UE support on bed rail; increased effort to stand noted  Ambulation/Gait Ambulation/Gait assistance: Min guard Gait Distance (Feet): 3 Feet (bed to recliner) Assistive device: None   Gait velocity: decreased   General Gait Details: pt holding onto furniture as needed for balance (pt declined use of walker)   Stairs             Wheelchair Mobility    Modified Rankin (Stroke Patients Only)       Balance Overall balance assessment: Needs assistance Sitting-balance support: No upper extremity supported;Feet supported Sitting balance-Leahy Scale: Normal Sitting balance - Comments: steady sitting reaching outside BOS   Standing balance support: During functional activity Standing balance-Leahy Scale: Fair Standing balance comment: holding onto furniture as needed for support walking bed to recliner                            Cognition Arousal/Alertness: Awake/alert Behavior During Therapy: WFL for tasks assessed/performed Overall Cognitive Status: Within Functional Limits for tasks assessed                                        Exercises      General Comments  Nursing cleared pt for participation in physical therapy.  Pt agreeable to PT session.      Pertinent Vitals/Pain Pain Assessment: No/denies pain HR WFL during  sessions activities.    Home Living                      Prior Function            PT Goals (current goals can now be found in the care plan section) Acute Rehab PT Goals Patient Stated Goal: to improve breathing PT Goal Formulation: With patient Time For Goal Achievement: 04/29/21 Potential to Achieve Goals: Fair Progress towards PT goals: Progressing toward goals    Frequency    Min 2X/week      PT Plan Current plan remains appropriate    Co-evaluation              AM-PAC PT "6 Clicks" Mobility   Outcome  Measure  Help needed turning from your back to your side while in a flat bed without using bedrails?: None Help needed moving from lying on your back to sitting on the side of a flat bed without using bedrails?: A Lot Help needed moving to and from a bed to a chair (including a wheelchair)?: A Little Help needed standing up from a chair using your arms (e.g., wheelchair or bedside chair)?: A Little Help needed to walk in hospital room?: A Little Help needed climbing 3-5 steps with a railing? : A Little 6 Click Score: 18    End of Session Equipment Utilized During Treatment: Oxygen (8 L via HFNC) Activity Tolerance: Patient tolerated treatment well (with pacing) Patient left: in chair (with OT present for OT session) Nurse Communication: Mobility status;Precautions;Other (comment) (pt's vitals during session) PT Visit Diagnosis: Unsteadiness on feet (R26.81);Other abnormalities of gait and mobility (R26.89);Muscle weakness (generalized) (M62.81)     Time: 1435-1450 PT Time Calculation (min) (ACUTE ONLY): 15 min  Charges:  $Therapeutic Activity: 8-22 mins                     Leitha Bleak, PT 04/17/21, 3:47 PM

## 2021-04-17 NOTE — Progress Notes (Signed)
Inpatient Diabetes Program Recommendations  AACE/ADA: New Consensus Statement on Inpatient Glycemic Control (2015)  Target Ranges:  Prepandial:   less than 140 mg/dL      Peak postprandial:   less than 180 mg/dL (1-2 hours)      Critically ill patients:  140 - 180 mg/dL   Lab Results  Component Value Date   GLUCAP 219 (H) 04/17/2021   HGBA1C 6.8 (H) 04/14/2021    Review of Glycemic Control  Diabetes history: DM2 Outpatient Diabetes medications: Metformin 500 BID, Jardiance 10 QD  Current orders for Inpatient glycemic control: Novolog 0-15 TID and 0-5 QHS, Prednisone 30 QAM  Inpatient Diabetes Program Recommendations:   While on steroids consider: -Novolog 2-3 units tid meal coverage if eats 50% Secure chat sent to Dr. Leslye Peer.  Thank you, Nani Gasser. Tarryn Bogdan, RN, MSN, CDE  Diabetes Coordinator Inpatient Glycemic Control Team Team Pager [phone] (8am-5pm) 04/17/2021 12:22 PM

## 2021-04-18 LAB — BASIC METABOLIC PANEL
Anion gap: 9 (ref 5–15)
BUN: 33 mg/dL — ABNORMAL HIGH (ref 8–23)
CO2: 40 mmol/L — ABNORMAL HIGH (ref 22–32)
Calcium: 8.4 mg/dL — ABNORMAL LOW (ref 8.9–10.3)
Chloride: 90 mmol/L — ABNORMAL LOW (ref 98–111)
Creatinine, Ser: 0.91 mg/dL (ref 0.61–1.24)
GFR, Estimated: >60 mL/min (ref >60)
Glucose, Bld: 138 mg/dL — ABNORMAL HIGH (ref 70–99)
Potassium: 4.2 mmol/L (ref 3.5–5.1)
Sodium: 139 mmol/L (ref 135–145)

## 2021-04-18 LAB — GLUCOSE, CAPILLARY
Glucose-Capillary: 147 mg/dL — ABNORMAL HIGH (ref 70–99)
Glucose-Capillary: 139 mg/dL — ABNORMAL HIGH (ref 70–99)
Glucose-Capillary: 361 mg/dL — ABNORMAL HIGH (ref 70–99)
Glucose-Capillary: 227 mg/dL — ABNORMAL HIGH (ref 70–99)

## 2021-04-18 MED ORDER — PREDNISONE 20 MG PO TABS
20.0000 mg | ORAL_TABLET | Freq: Every day | ORAL | Status: DC
  Administered 2021-04-19 – 2021-04-20 (×2): 20 mg via ORAL
  Filled 2021-04-18 (×2): qty 1

## 2021-04-18 MED ORDER — SALINE SPRAY 0.65 % NA SOLN
1.0000 | NASAL | Status: DC | PRN
  Filled 2021-04-18: qty 44

## 2021-04-18 MED ORDER — INSULIN GLARGINE-YFGN 100 UNIT/ML ~~LOC~~ SOLN
5.0000 [IU] | Freq: Every day | SUBCUTANEOUS | Status: DC
  Administered 2021-04-18: 5 [IU] via SUBCUTANEOUS
  Filled 2021-04-18 (×2): qty 0.05

## 2021-04-18 NOTE — Progress Notes (Signed)
Patient ID: Julian Sparks, male   DOB: 1946-07-01, 76 y.o.   MRN: [id number] [facility] PROGRESS NOTE  Julian Sparks HWE:[id number] DOB: 11-Apr-1946 DOA: 04/13/2021 PCP: [name], MD  HPI/Subjective: Patient states he was able to wear the BiPAP for about 6 hours last night.  He is feeling better he is breathing a little bit better.  Still with slight shortness of breath and some cough.  Urinating well.  Admitted with pneumonia and COPD exacerbation but also treating for CHF.  Objective: Vitals:   04/18/21 1021 04/18/21 1105  BP: 124/68 126/60  Pulse: 66 72  Resp: 20 18  Temp:  98.3 F (36.8 C)  SpO2: 95% 96%    Intake/Output Summary (Last 24 hours) at 04/18/2021 1332 Last data filed at 04/18/2021 1109 Gross per 24 hour  Intake 720 ml  Output 3400 ml  Net -2680 ml   Filed Weights   04/16/21 0419 04/17/21 0534 04/18/21 0400  Weight: 92.1 kg 91.6 kg 91.4 kg    ROS: Review of Systems  Respiratory:  Positive for cough and shortness of breath.   Cardiovascular:  Negative for chest pain.  Gastrointestinal:  Negative for abdominal pain, nausea and vomiting.  Exam: Physical Exam HENT:     Head: Normocephalic.     Mouth/Throat:     Pharynx: No oropharyngeal exudate.  Eyes:     General: Lids are normal.     Conjunctiva/sclera: Conjunctivae normal.     Pupils: Pupils are equal, round, and reactive to light.  Cardiovascular:     Rate and Rhythm: Normal rate and regular rhythm.     Heart sounds: Normal heart sounds, S1 normal and S2 normal.  Pulmonary:     Breath sounds: Examination of the right-lower field reveals decreased breath sounds and rhonchi. Examination of the left-lower field reveals decreased breath sounds and rhonchi. Decreased breath sounds and rhonchi present. No wheezing or rales.  Abdominal:     Palpations: Abdomen is soft.     Tenderness: There is no abdominal tenderness.  Musculoskeletal:     Right lower leg: Swelling present.     Left lower leg:  Swelling present.  Skin:    General: Skin is warm.     Findings: No rash.     Comments: Bruising right face  Neurological:     Mental Status: He is alert and oriented to person, place, and time.      Scheduled Meds:  aspirin EC  81 mg Oral Daily   atorvastatin  40 mg Oral Daily   benzonatate  100 mg Oral TID   enoxaparin (LOVENOX) injection  0.5 mg/kg Subcutaneous Q24H   furosemide  40 mg Intravenous BID   gabapentin  300 mg Oral TID   insulin aspart  0-15 Units Subcutaneous TID WC   insulin aspart  0-5 Units Subcutaneous QHS   insulin aspart  2 Units Subcutaneous TID WC   insulin glargine-yfgn  6 Units Subcutaneous QHS   ipratropium-albuterol  3 mL Nebulization QID   metoprolol succinate  25 mg Oral Daily   mometasone-formoterol  2 puff Inhalation BID   omega-3 acid ethyl esters  2 capsule Oral BID   potassium chloride  20 mEq Oral BID   predniSONE  30 mg Oral Q breakfast   rOPINIRole  1 mg Oral QHS   sodium chloride flush  3 mL Intravenous Q12H   traZODone  100 mg Oral QHS   Continuous Infusions:  sodium chloride     Brief history.  75 year old man admitted 04/13/2021 with shortness of breath.  He was found to have acute on chronic hypercapnic respiratory failure and started on BiPAP and Rocephin and Zithromax for multifocal pneumonia.  CT scan negative for pulmonary embolism.  Patient is on high flow nasal cannula during the entire hospital course and BiPAP at night.  He was started on IV Lasix a couple days ago for worsening infiltrate seen on chest x-ray.  The patient finally starting to move better air.  Once able to get back to his normal 4 L of oxygen likely can be discharged home.  Past medical history COPD, chronic respiratory failure on 4 L of oxygen, diastolic CHF, type 2 diabetes mellitus with hyperlipidemia and hypertension  Assessment/Plan:  Acute on chronic hypoxic hypercapnic respiratory failure.  Continue to wear the BiPAP at night as much as possible.  Order  signed for NIV at night at home.  Patient on 6 L high flow nasal cannula this morning and looks like down to 5 L.  Hopefully can switch from high flow nasal cannula to regular nasal cannula soon.  Patient chronically wears 4 L of oxygen at home. Bilateral pneumonia.  Completed 5 days of antibiotics COPD exacerbation.  Continue prednisone on a daily basis likely will need a longer taper of prednisone at home.  Starting to move better air today. Acute on chronic diastolic congestive heart failure.  Continue IV Lasix today.  Reassess tomorrow with BMP. Acute metabolic encephalopathy.  Has improved back to baseline.  Likely combination of CO2 retention pneumonia and steroids. Type 2 diabetes mellitus with hyperlipidemia neuropathy on atorvastatin and gabapentin.  I added low-dose Lantus at night but likely can go back on oral meds as outpatient. Essential hypertension on metoprolol and Lasix Obesity with a BMI of 32.52    Code Status:     Code Status Orders  (From admission, onward)           Start     Ordered   04/13/21 1247  Full code  Continuous        04/13/21 1250           Code Status History     Date Active Date Inactive Code Status Order ID Comments User Context   01/28/2021 1130 02/14/2021 1816 Partial Code [id number]  Flora Lipps, MD Inpatient   01/24/2021 1244 01/28/2021 1130 Full Code [id number]  Collier Bullock, MD ED   02/04/2019 1629 02/06/2019 1515 Full Code [id number]  Salary, Avel Peace, MD Inpatient      Family Communication: Spoke with patient's daughter on the phone Disposition Plan: Status is: Inpatient  Dispo: The patient is from: Home              Anticipated d/c is to: Home in the next 24 to 48 hours depending on clinical course              Patient currently will need to be switched from high flow nasal cannula over to regular nasal cannula.  Patient chronically wears 4 L of oxygen at home.  NIV orders for home signed   Difficult to place patient.   No.  Antibiotics: Completed  Time spent: 28 minutes  Floris

## 2021-04-18 NOTE — Progress Notes (Addendum)
Mobility Specialist - Progress Note   04/18/21 1600  Mobility  Activity Ambulated in room  Level of Assistance Standby assist, set-up cues, supervision of patient - no hands on  Assistive Device Front wheel walker  Distance Ambulated (ft) 20 ft  Mobility Ambulated with assistance in room  Mobility Response Tolerated well  Mobility performed by Mobility specialist  $Mobility charge 1 Mobility    Pre-mobility: 87 HR, 92% SpO2 During mobility: 102 HR, 77% SpO2 Post-mobility: 89 HR, 94% SpO2   Pt sitting in recliner upon arrival, utilizing 5L. No physical assist to stand, pt reports feeling a little wobbly d/t op shoe. RW in use. Ambulated in room with no LOB. Cues to slow pace for energy conservation. Pt denied SOB during ambulation. O2 desat to a low of 77% during session. Sats rebound to mid 90s after ~1 min of rest and PLB. Pt states this has been normal for him when getting up to ambulate to the bathroom. Pt rated level of exertion a 7, but states it wasn't difficult physically---however, he bases his LOE on how low his sats drop. Pt left in recliner with needs in reach, family at bedside.    Kathee Delton Mobility Specialist 04/18/21, 4:13 PM

## 2021-04-18 NOTE — Progress Notes (Signed)
PT Cancellation Note  Patient Details Name: Julian Sparks MRN: [id number] DOB: [birth year]   Cancelled Treatment:    Reason Eval/Treat Not Completed: Other (comment) Pt seen earlier this afternoon by mobility tech with sats dropping to 77% (on 5L) during 20 ft  bout of ambulation.  Given this significant drop, just recently, with modest activity this author elected to defer further activity with PT this date.    Kreg Shropshire, DPT 04/18/2021, 5:12 PM

## 2021-04-19 ENCOUNTER — Ambulatory Visit: Payer: Medicare HMO | Admitting: Internal Medicine

## 2021-04-19 DIAGNOSIS — G4733 Obstructive sleep apnea (adult) (pediatric): Secondary | ICD-10-CM

## 2021-04-19 LAB — GLUCOSE, CAPILLARY
Glucose-Capillary: 170 mg/dL — ABNORMAL HIGH (ref 70–99)
Glucose-Capillary: 191 mg/dL — ABNORMAL HIGH (ref 70–99)
Glucose-Capillary: 400 mg/dL — ABNORMAL HIGH (ref 70–99)
Glucose-Capillary: 400 mg/dL — ABNORMAL HIGH (ref 70–99)

## 2021-04-19 MED ORDER — FUROSEMIDE 40 MG PO TABS
40.0000 mg | ORAL_TABLET | Freq: Every day | ORAL | Status: DC
  Administered 2021-04-19 – 2021-04-20 (×2): 40 mg via ORAL
  Filled 2021-04-19 (×2): qty 1

## 2021-04-19 MED ORDER — EMPAGLIFLOZIN 10 MG PO TABS
10.0000 mg | ORAL_TABLET | Freq: Every day | ORAL | Status: DC
  Administered 2021-04-19 – 2021-04-20 (×2): 10 mg via ORAL
  Filled 2021-04-19 (×2): qty 1

## 2021-04-19 NOTE — Progress Notes (Signed)
Occupational Therapy Treatment Patient Details Name: Julian Sparks MRN: [id number] DOB: May 22, 1946 Today's Date: 04/19/2021    History of present illness Pt is a 75 y.o. male presenting to hospital 8/18 with SOB (increasing last 24 hours) and hypoxia.  Pt admitted with acute on chronic hypoxic hypercapnic respiratory failure, B PNA, acute metabolic encephalopathy, and COPD exacerbation.  PMH includes COPD, CHF, DM, htn, OSA, ulcer L foot, paroxysmal atrial tachycardia, lymphoma, acute cor pulmonale, hyperkalemia, adhesive capsulitis B shoulders, full thickness rotator cuff tear s/p repair (L).   OT comments  Pt seen for OT Tx this date to f/u re: safety with ADLs/ADL mobility. Pt agreeable to session, but politely declines fxl mobility or ADL transfers at this time citing decreased tolerance as evidenced by saturations dropping with transfers yesterday and today. Pt encouraged to participate in seated dynamic reaching tasks x4 per side in all functional planes to his tolerance to both address limited shoulder ROM as well as core control and strength to preserve musculature surrounding lungs. In addition, OT Engages pt in seated g/h tasks including oral care and washing face with SETUP while education provided re: sitting for energy conservation with UB ADLs as needed. Pt with good understanding. Will continue to follow acutely.    Follow Up Recommendations  No OT follow up;Supervision - Intermittent    Equipment Recommendations  None recommended by OT    Recommendations for Other Services      Precautions / Restrictions Precautions Precautions: Fall Restrictions Weight Bearing Restrictions: No       Mobility Bed Mobility                    Transfers                      Balance                                           ADL either performed or assessed with clinical judgement   ADL Overall ADL's : Needs assistance/impaired     Grooming: Set  up;Oral care;Wash/dry face;Sitting Grooming Details (indicate cue type and reason): SETUP and education about seated ADLs as needed for energy conversation as well as monitoring his response to activity with RPE scale.                                     Vision Patient Visual Report: No change from baseline     Perception     Praxis      Cognition Arousal/Alertness: Awake/alert Behavior During Therapy: WFL for tasks assessed/performed Overall Cognitive Status: Within Functional Limits for tasks assessed                                          Exercises Other Exercises Other Exercises: OT engages pt in dynamic reaching tasks while seated in chair to address L shoulder ROM as well as core strength and control, reaching in all forward planes as tolerable x4 per side with cues for pacing/energy conservation with reaching to obtain household items.   Shoulder Instructions       General Comments      Pertinent Vitals/ Pain  Pain Assessment: No/denies pain  Home Living                                          Prior Functioning/Environment              Frequency  Min 1X/week        Progress Toward Goals  OT Goals(current goals can now be found in the care plan section)  Progress towards OT goals: Progressing toward goals  Acute Rehab OT Goals Patient Stated Goal: to improve breathing OT Goal Formulation: With patient/family Time For Goal Achievement: 05/01/21 Potential to Achieve Goals: Good  Plan Discharge plan remains appropriate    Co-evaluation                 AM-PAC OT "6 Clicks" Daily Activity     Outcome Measure   Help from another person eating meals?: None Help from another person taking care of personal grooming?: A Little Help from another person toileting, which includes using toliet, bedpan, or urinal?: A Little Help from another person bathing (including washing, rinsing,  drying)?: A Little Help from another person to put on and taking off regular upper body clothing?: None Help from another person to put on and taking off regular lower body clothing?: A Little 6 Click Score: 20    End of Session Equipment Utilized During Treatment: Oxygen  OT Visit Diagnosis: Other abnormalities of gait and mobility (R26.89)   Activity Tolerance Patient tolerated treatment well   Patient Left in chair;with call bell/phone within reach;with chair alarm set   Nurse Communication          Time: 3810-1751 OT Time Calculation (min): 16 min  Charges: OT General Charges $OT Visit: 1 Visit OT Treatments $Self Care/Home Management : 8-22 mins  Gerrianne Scale, MS, OTR/L ascom [phone] 04/19/21, 5:29 PM

## 2021-04-19 NOTE — Progress Notes (Signed)
Julian Sparks at [place] NAME: Julian Sparks    MR#:  [id number]  DATE OF BIRTH:  01-Apr-1946  SUBJECTIVE:  sitting out in the chair. Daughter and granddaughter in the room. Patient overall improving. Currently on 4 L nasal cannula oxygen. No fever  REVIEW OF SYSTEMS:   Review of Systems  Constitutional:  Negative for chills, fever and weight loss.  HENT:  Negative for ear discharge, ear pain and nosebleeds.   Eyes:  Negative for blurred vision, pain and discharge.  Respiratory:  Positive for cough and shortness of breath. Negative for sputum production, wheezing and stridor.   Cardiovascular:  Negative for chest pain, palpitations, orthopnea and PND.  Gastrointestinal:  Negative for abdominal pain, diarrhea, nausea and vomiting.  Genitourinary:  Negative for frequency and urgency.  Musculoskeletal:  Negative for back pain and joint pain.  Neurological:  Positive for weakness. Negative for sensory change, speech change and focal weakness.  Psychiatric/Behavioral:  Negative for depression and hallucinations. The patient is not nervous/anxious.   Tolerating Diet:yes Tolerating PT: yes--HHPT  DRUG ALLERGIES:   Allergies  Allergen Reactions  . Aspirin Nausea Only and Other (See Comments)    325 mg aspirin cause stomach upset  . Penicillins Rash    VITALS:  Blood pressure (!) 116/47, pulse 83, temperature 98.4 F (36.9 C), resp. rate 19, height 5\' 6"  (1.676 m), weight 90.3 kg, SpO2 (!) 88 %.  PHYSICAL EXAMINATION:   Physical Exam  GENERAL:  75 y.o.-year-old patient lying in the bed with no acute distress. Chronically ill and fatigued LUNGS: decreased breath sounds bilaterally, no wheezing, rales, rhonchi. No use of accessory muscles of respiration.  CARDIOVASCULAR: S1, S2 normal. No murmurs, rubs, or gallops.  ABDOMEN: Soft, nontender, nondistended. Bowel sounds present. No organomegaly or mass.  EXTREMITIES: No cyanosis, clubbing or edema  b/l.    NEUROLOGIC: Cranial nerves II through XII are intact. No focal Motor or sensory deficits b/l.   PSYCHIATRIC:  patient is alert and oriented x 3.  SKIN: No obvious rash, lesion, or ulcer.   LABORATORY PANEL:  CBC Recent Labs  Lab 04/17/21 0515  WBC 8.5  HGB 9.8*  HCT 35.0*  PLT 233    Chemistries  Recent Labs  Lab 04/13/21 1048 04/14/21 0502 04/18/21 0342  NA 138   < > 139  K 4.7   < > 4.2  CL 92*   < > 90*  CO2 37*   < > 40*  GLUCOSE 127*   < > 138*  BUN 31*   < > 33*  CREATININE 0.88   < > 0.91  CALCIUM 8.7*   < > 8.4*  AST 18  --   --   ALT 16  --   --   ALKPHOS 74  --   --   BILITOT 1.0  --   --    < > = values in this interval not displayed.   Cardiac Enzymes No results for input(s): TROPONINI in the last 168 hours. RADIOLOGY:  No results found. ASSESSMENT AND PLAN:  Julian Sparks is a 75 y.o. male with medical history significant for COPD with chronic respiratory failure on 4 L of oxygen continuous, diabetes mellitus, chronic diastolic dysfunction CHF and hypertension who presents to the emergency room from the cardiology clinic for evaluation of worsening shortness of breath from his baseline which started 1 day prior to his admission.  He has a cough productive of greenish phlegm  but denies having any fever, no chills, no leg swelling.  acute on chronic hypoxic hypercapnia respiratory failure bilateral pneumonia bilateral pneumonia -- patient overall improving. His oxygen requirement remains 4-5 liters nasal cannula -- continue BiPAP at night -- NIV/trilogy to be delivered at home discussed with daughter -- completed antibiotic treatment  COPD exacerbation -- continue prednisone taper and patient will need likely daily low dose prednisone  acute on chronic diastolic congestive heart failure -- diuresed very well. -- about 14 L urine output -- change to oral Lasix  acute metabolic encephalopathy -- improved patient is back to baseline --  combination of hypoxia, CO2 retention, infection  type II diabetes with hyperlipidemia and neuropathy -- on atorvastatin and gabapentin -- will resume oral meds at discharge  Hypertension -- on metoprolol and Lasix  overall improving. Discharge likely tomorrow. Patient and family agreeable   Procedures: Family communication : Consults : CODE STATUS:  DVT Prophylaxis : Level of care: Progressive Cardiac Status is: Inpatient  Remains inpatient appropriate because:Inpatient level of care appropriate due to severity of illness  Dispo: The patient is from: Home              Anticipated d/c is to: Home              Patient currently is not medically stable to d/c.   Difficult to place patient No        TOTAL TIME TAKING CARE OF THIS PATIENT: 25 minutes.  >50% time spent on counselling and coordination of care  Note: This dictation was prepared with Dragon dictation along with smaller phrase technology. Any transcriptional errors that result from this process are unintentional.  [name] M.D    [facility]   CC: Primary care physician; [name], MD Patient ID: Julian Sparks, male   DOB: [birth year], 75 y.o.   MRN: [id number]

## 2021-04-19 NOTE — Progress Notes (Signed)
PT Cancellation Note  Patient Details Name: Julian Sparks MRN: [id number] DOB: 02/24/1946   Cancelled Treatment:    Reason Eval/Treat Not Completed: Other (comment) Attempted to see pt earlier this afternoon, he had just finished dinner sitting up in recliner.  He was very pleasant but said that he did not feel like he could do anything with PT today, due to his breathing.  Even offers to just do a few exercises (as opposed to ambulation) were refused, again pleasantly.  O2 in sitting was 88% on 4L.  Kreg Shropshire, DPT 04/19/2021, 5:42 PM

## 2021-04-20 DIAGNOSIS — E139 Other specified diabetes mellitus without complications: Secondary | ICD-10-CM

## 2021-04-20 DIAGNOSIS — I509 Heart failure, unspecified: Secondary | ICD-10-CM

## 2021-04-20 LAB — GLUCOSE, CAPILLARY
Glucose-Capillary: 138 mg/dL — ABNORMAL HIGH (ref 70–99)
Glucose-Capillary: 198 mg/dL — ABNORMAL HIGH (ref 70–99)

## 2021-04-20 LAB — CREATININE, SERUM
Creatinine, Ser: 0.85 mg/dL (ref 0.61–1.24)
GFR, Estimated: >60 mL/min (ref >60)

## 2021-04-20 MED ORDER — BENZONATATE 100 MG PO CAPS: 100.0000 mg | ORAL_CAPSULE | Freq: Three times a day (TID) | ORAL | 0 refills | Status: DC

## 2021-04-20 MED ORDER — METFORMIN HCL 500 MG PO TABS: 500.0000 mg | ORAL_TABLET | Freq: Two times a day (BID) | ORAL | Status: DC

## 2021-04-20 MED ORDER — PREDNISONE 10 MG PO TABS: 10.0000 mg | ORAL_TABLET | Freq: Every day | ORAL | Status: DC

## 2021-04-20 MED ORDER — IPRATROPIUM-ALBUTEROL 0.5-2.5 (3) MG/3ML IN SOLN: 3.0000 mL | Freq: Three times a day (TID) | RESPIRATORY_TRACT | Status: DC

## 2021-04-20 MED ORDER — PREDNISONE 10 MG PO TABS: 10.0000 mg | ORAL_TABLET | Freq: Every day | ORAL | 2 refills | Status: DC

## 2021-04-20 NOTE — Progress Notes (Signed)
Patient stated he will let nurse know if he wanted to wear bipap for the night.  Patient remains on 4LPM oxygen via nasal cannula.

## 2021-04-20 NOTE — Progress Notes (Signed)
Occupational Therapy Treatment Patient Details Name: Julian Sparks MRN: [id number] DOB: 1946/03/18 Today's Date: 04/20/2021    History of present illness Pt is a 75 y.o. male presenting to hospital 8/18 with SOB (increasing last 24 hours) and hypoxia.  Pt admitted with acute on chronic hypoxic hypercapnic respiratory failure, B PNA, acute metabolic encephalopathy, and COPD exacerbation.  PMH includes COPD, CHF, DM, htn, OSA, ulcer L foot, paroxysmal atrial tachycardia, lymphoma, acute cor pulmonale, hyperkalemia, adhesive capsulitis B shoulders, full thickness rotator cuff tear s/p repair (L).   OT comments  Pt seen for OT tx this date to f/u re: safety with ADLs/ADL mobility. Pt requesting to use the restroom. Wants to ambulate to restroom. Pt able to come to EOB sitting with SUPV with HOB elevated, while it's effortful, he does not require physical assist. Pt requires MIN cues for safety and SUPV to CTS from EOB with RW. Pt completes fxl mobility to restroom with one cue for safety/obstacle avoidance and SUPV/SBA for O2 line mgt. Pt able to transfer to/from commode with use of grab bar and one cues for safety as pt not fully backed up to commode before attempting to sit. Pt's O2 sats noted to have dropped on 6Lnc to 83% with fxl mobility to restroom. OT increases oxygen flow rate to 8Lnc and pt requires 3-4 mins seated rest to recover to 88-89%.   Pt able to complete peri care in sitting with MOD I/SUPV. Tolerates standing ~30 seconds-1 min while completing hand hygiene after toileting (requires prompt to initiate hand hygiene and OT offer ed re: importance/rationale). Pt completes fxl mobility to chair on opposite side of room with one ~10-15 sec standing rest break. In sitting, pt requires ~2-3 mins to recover sats again, this time from 77%. He is encouraged to conserve energy and stop talking briefly as MD is in the room at this time explaining d/c plans. Pt able to be titrated back to 6Lnc and with  seated resting, he maintains sats of 89-93%. Pt left with all needs met and in reach. Will continue to follow acutely.   Follow Up Recommendations  No OT follow up;Supervision - Intermittent    Equipment Recommendations  None recommended by OT    Recommendations for Other Services      Precautions / Restrictions Precautions Precautions: Fall Restrictions Weight Bearing Restrictions: No       Mobility Bed Mobility Overal bed mobility: Needs Assistance Bed Mobility: Supine to Sit     Supine to sit: Supervision;Modified independent (Device/Increase time);HOB elevated     General bed mobility comments: effortful, but able without assist    Transfers Overall transfer level: Needs assistance Equipment used: Rolling walker (2 wheeled) Transfers: Sit to/from Stand Sit to Stand: Supervision;Modified independent (Device/Increase time)         General transfer comment: effortful, but able to CTS w/o physical assistance. RW placed in front of patient primarily in prep for fxl mobility, he is able to smoothly transition hands to walker once in standing    Balance Overall balance assessment: Needs assistance Sitting-balance support: No upper extremity supported;Feet supported Sitting balance-Leahy Scale: Normal     Standing balance support: Single extremity supported;During functional activity Standing balance-Leahy Scale: Fair Standing balance comment: UE support on RW primarily for EC, able to static stand w/o UE support, but does benefit from the support with fxl mobility.  ADL either performed or assessed with clinical judgement   ADL Overall ADL's : Needs assistance/impaired     Grooming: Set up;Wash/dry hands;Supervision/safety;Standing Grooming Details (indicate cue type and reason): tolerates ~30sec-1 min standing to perform hand hygiene after peri care and fxl mobility from toilet to sink             Lower Body Dressing:  Moderate assistance;Sitting/lateral leans Lower Body Dressing Details (indicate cue type and reason): to don socks d/t limited LE ROM/hip rotation Toilet Transfer: Supervision/safety;Grab bars;RW;Ambulation Toilet Transfer Details (indicate cue type and reason): amb to restroom, originally on 6L, turned up to 8L as he de-sats to 83%. requires ~3-4 mins seated rest on commode to recover to 88% on the 8L. Toileting- Clothing Manipulation and Hygiene: Supervision/safety;Sitting/lateral lean Toileting - Clothing Manipulation Details (indicate cue type and reason): for peri care after BM     Functional mobility during ADLs: Supervision/safety;Rolling walker (OT managing O2 lines, some cues for safety/obstacle avoidance.)       Vision Patient Visual Report: No change from baseline     Perception     Praxis      Cognition Arousal/Alertness: Awake/alert Behavior During Therapy: WFL for tasks assessed/performed Overall Cognitive Status: Within Functional Limits for tasks assessed                                          Exercises Other Exercises Other Exercises: OT engaes pt in toileting and fxl mobility to/from restroom as well as standing hand hygiene.   Shoulder Instructions       General Comments      Pertinent Vitals/ Pain       Pain Assessment: No/denies pain  Home Living                                          Prior Functioning/Environment              Frequency  Min 1X/week        Progress Toward Goals  OT Goals(current goals can now be found in the care plan section)  Progress towards OT goals: Progressing toward goals  Acute Rehab OT Goals Patient Stated Goal: to improve breathing OT Goal Formulation: With patient/family Time For Goal Achievement: 05/01/21 Potential to Achieve Goals: Good  Plan Discharge plan remains appropriate    Co-evaluation                 AM-PAC OT "6 Clicks" Daily Activity      Outcome Measure   Help from another person eating meals?: None Help from another person taking care of personal grooming?: A Little Help from another person toileting, which includes using toliet, bedpan, or urinal?: A Little Help from another person bathing (including washing, rinsing, drying)?: A Little Help from another person to put on and taking off regular upper body clothing?: None Help from another person to put on and taking off regular lower body clothing?: A Little 6 Click Score: 20    End of Session Equipment Utilized During Treatment: Oxygen  OT Visit Diagnosis: Other abnormalities of gait and mobility (R26.89)   Activity Tolerance Patient tolerated treatment well   Patient Left in chair;with call bell/phone within reach;with chair alarm set   Nurse Communication  Time: 5502-7142 OT Time Calculation (min): 24 min  Charges: OT General Charges $OT Visit: 1 Visit OT Treatments $Self Care/Home Management : 8-22 mins $Therapeutic Activity: 8-22 mins  Gerrianne Scale, MS, OTR/L ascom [phone] 04/20/21, 10:18 AM

## 2021-04-20 NOTE — Discharge Instructions (Signed)
Use your oxygen, inhaler, nebulizer as before Use your [facility] as per instructions

## 2021-04-20 NOTE — Discharge Summary (Signed)
[facility] NAME: Julian Sparks    MR#:  [id number]  DATE OF BIRTH:  [birth year]  DATE OF ADMISSION:  04/13/2021 ADMITTING PHYSICIAN: Collier Bullock, MD  DATE OF DISCHARGE: 04/20/2021  PRIMARY CARE PHYSICIAN: [name], MD    ADMISSION DIAGNOSIS:  Acute on chronic respiratory failure (HCC) [J96.20] Acute on chronic respiratory failure with hypoxia (HCC) [J96.21] Acute congestive heart failure, unspecified heart failure type ([place]) [I50.9]  DISCHARGE DIAGNOSIS:  acute on chronic hypoxic respiratory failure secondary to congestive heart failure acute on chronic diastolic and severe COPD exacerbation  SECONDARY DIAGNOSIS:   Past Medical History:  Diagnosis Date  . CHF (congestive heart failure) ([place])   . Chronic bronchitis ([place])   . COPD (chronic obstructive pulmonary disease) ([place])   . Diabetes mellitus without complication ([place])   . Diastolic dysfunction   . Dyspnea    with exertion  . Hypertension     HOSPITAL COURSE:  Julian Sparks is a 75 y.o. male with medical history significant for COPD with chronic respiratory failure on 4 L of oxygen continuous, diabetes mellitus, chronic diastolic dysfunction CHF and hypertension who presents to the emergency room from the cardiology clinic for evaluation of worsening shortness of breath from his baseline which started 1 day prior to his admission.  He has a cough productive of greenish phlegm but denies having any fever, no chills, no leg swelling.   acute on chronic hypoxic hypercapnia respiratory failure bilateral pneumonia bilateral pneumonia -- patient overall improving. His oxygen requirement remains 4-5 liters nasal cannula -- continue BiPAP at night -- NIV/trilogy to be delivered at home discussed with daughter -- completed antibiotic treatment   COPD exacerbation -- continue prednisone taper and patient will need likely daily low dose prednisone   acute on chronic  diastolic congestive heart failure -- diuresed very well. -- about 14 L urine output -- change to oral Lasix   acute metabolic encephalopathy -- improved patient is back to baseline -- combination of hypoxia, CO2 retention, infection   type II diabetes with hyperlipidemia and neuropathy -- on atorvastatin and gabapentin -- will resume oral meds at discharge   Hypertension -- on metoprolol and Lasix   overall improving. Discharge to home Patient and family agreeable      Family communication : daughter [name] on the phone Consults : CODE STATUS: full DVT Prophylaxis : enoxaparin Level of care: Progressive Cardiac Status is: Inpatient     Dispo: The patient is from: Home              Anticipated d/c is to: Home              Patient currently is medically optimized for  d/c.              Difficult to place patient No      CONSULTS OBTAINED:    DRUG ALLERGIES:   Allergies  Allergen Reactions  . Aspirin Nausea Only and Other (See Comments)    325 mg aspirin cause stomach upset  . Penicillins Rash    DISCHARGE MEDICATIONS:   Allergies as of 04/20/2021       Reactions   Aspirin Nausea Only, Other (See Comments)   325 mg aspirin cause stomach upset   Penicillins Rash        Medication List     TAKE these medications    Accu-Chek Aviva Plus test strip Generic drug: glucose blood TEST BLOOD SUGAR TWICE  DAILY   Accu-Chek Aviva Plus w/Device Kit Check blood sugar BID   Accu-Chek Softclix Lancets lancets Check blood sugar two times daily   albuterol 108 (90 Base) MCG/ACT inhaler Commonly known as: Ventolin HFA Inhale 2 puffs into the lungs as needed for wheezing or shortness of breath.   aspirin 81 MG EC tablet Take 81 mg by mouth daily. Swallow whole.   atorvastatin 40 MG tablet Commonly known as: LIPITOR TAKE 1 TABLET EVERY DAY   B-D SINGLE USE SWABS REGULAR Pads USE AS DIRECTED   benzonatate 100 MG capsule Commonly known as: TESSALON Take  1 capsule (100 mg total) by mouth 3 (three) times daily.   budesonide-formoterol 160-4.5 MCG/ACT inhaler Commonly known as: Symbicort Inhale 2 puffs into the lungs 2 (two) times daily.   cyclobenzaprine 10 MG tablet Commonly known as: FLEXERIL Take 10 mg by mouth at bedtime as needed for muscle spasms.   Fish Oil 1000 MG Caps Take 2 capsules by mouth 2 (two) times daily.   fluticasone 50 MCG/ACT nasal spray Commonly known as: FLONASE Place 1 spray into both nostrils 2 (two) times a day.   furosemide 20 MG tablet Commonly known as: LASIX TAKE 2 TABLETS EVERY DAY   gabapentin 300 MG capsule Commonly known as: NEURONTIN Take 1 capsule (300 mg total) by mouth 3 (three) times daily.   gentamicin cream 0.1 % Commonly known as: GARAMYCIN Apply 1 application topically 2 (two) times daily.   guaiFENesin 100 MG/5ML Soln Commonly known as: ROBITUSSIN Take 5 mLs (100 mg total) by mouth every 4 (four) hours as needed for cough or to loosen phlegm.   ipratropium-albuterol 0.5-2.5 (3) MG/3ML Soln Commonly known as: DUONEB INHALE THE CONTENTS OF 1 VIAL VIA NEBULIZER EVERY 4 HOURS AS NEEDED   Jardiance 10 MG Tabs tablet Generic drug: empagliflozin TAKE 1 TABLET(10 MG) BY MOUTH DAILY   metFORMIN 500 MG tablet Commonly known as: GLUCOPHAGE TAKE 1 TABLET TWICE DAILY   metoprolol succinate 25 MG 24 hr tablet Commonly known as: TOPROL-XL TAKE 1 TABLET(25 MG) BY MOUTH DAILY   NYQUIL PO Take by mouth as needed.   predniSONE 10 MG tablet Commonly known as: DELTASONE Take 1 tablet (10 mg total) by mouth daily with breakfast. Start taking on: April 21, 2021   SUPER B COMPLEX PO Take 1 tablet by mouth daily.   traZODone 50 MG tablet Commonly known as: DESYREL TAKE 2 TABLETS EVERY DAY What changed: when to take this        If you experience worsening of your admission symptoms, develop shortness of breath, life threatening emergency, suicidal or homicidal thoughts you must  seek medical attention immediately by calling 911 or calling your MD immediately  if symptoms less severe.  You Must read complete instructions/literature along with all the possible adverse reactions/side effects for all the Medicines you take and that have been prescribed to you. Take any new Medicines after you have completely understood and accept all the possible adverse reactions/side effects.   Please note  You were cared for by a hospitalist during your hospital stay. If you have any questions about your discharge medications or the care you received while you were in the hospital after you are discharged, you can call the unit and asked to speak with the hospitalist on call if the hospitalist that took care of you is not available. Once you are discharged, your primary care physician will handle any further medical issues. Please note that NO REFILLS for  any discharge medications will be authorized once you are discharged, as it is imperative that you return to your primary care physician (or establish a relationship with a primary care physician if you do not have one) for your aftercare needs so that they can reassess your need for medications and monitor your lab values. Today   SUBJECTIVE   patient ambulating well with occupational therapy in the room. Has chronic shortness of breath. Overall appears near baseline  VITAL SIGNS:  Blood pressure 117/61, pulse 70, temperature 98.2 F (36.8 C), resp. rate 20, height 5' 6"  (1.676 m), weight 89.9 kg, SpO2 92 %.  I/O:   Intake/Output Summary (Last 24 hours) at 04/20/2021 1044 Last data filed at 04/20/2021 1018 Gross per 24 hour  Intake 960 ml  Output 2275 ml  Net -1315 ml    PHYSICAL EXAMINATION:  GENERAL:  75 y.o.-year-old patient lying in the bed with no acute distress.  LUNGS: distant breath sounds bilaterally, no wheezing, rales,rhonchi or crepitation. No use of accessory muscles of respiration.  CARDIOVASCULAR: S1, S2 normal.  No murmurs, rubs, or gallops.  ABDOMEN: Soft, non-tender, non-distended. Bowel sounds present. No organomegaly or mass.  EXTREMITIES: No pedal edema, cyanosis, or clubbing.  NEUROLOGIC: non focal PSYCHIATRIC: patient is alert and oriented x 3.  SKIN: No obvious rash, lesion, or ulcer.   DATA REVIEW:   CBC  Recent Labs  Lab 04/17/21 0515  WBC 8.5  HGB 9.8*  HCT 35.0*  PLT 233    Chemistries  Recent Labs  Lab 04/13/21 1048 04/14/21 0502 04/18/21 0342 04/20/21 0438  NA 138   < > 139  --   K 4.7   < > 4.2  --   CL 92*   < > 90*  --   CO2 37*   < > 40*  --   GLUCOSE 127*   < > 138*  --   BUN 31*   < > 33*  --   CREATININE 0.88   < > 0.91 0.85  CALCIUM 8.7*   < > 8.4*  --   AST 18  --   --   --   ALT 16  --   --   --   ALKPHOS 74  --   --   --   BILITOT 1.0  --   --   --    < > = values in this interval not displayed.    Microbiology Results   Recent Results (from the past 240 hour(s))  Resp Panel by RT-PCR (Flu A&B, Covid) Nasopharyngeal Swab     Status: None   Collection Time: 04/13/21 10:48 AM   Specimen: Nasopharyngeal Swab; Nasopharyngeal(NP) swabs in vial transport medium  Result Value Ref Range Status   SARS Coronavirus 2 by RT PCR NEGATIVE NEGATIVE Final    Comment: (NOTE) SARS-CoV-2 target nucleic acids are NOT DETECTED.  The SARS-CoV-2 RNA is generally detectable in upper respiratory specimens during the acute phase of infection. The lowest concentration of SARS-CoV-2 viral copies this assay can detect is 138 copies/mL. A negative result does not preclude SARS-Cov-2 infection and should not be used as the sole basis for treatment or other patient management decisions. A negative result may occur with  improper specimen collection/handling, submission of specimen other than nasopharyngeal swab, presence of viral mutation(s) within the areas targeted by this assay, and inadequate number of viral copies(<138 copies/mL). A negative result must be combined  with clinical observations, patient history, and epidemiological information. The  expected result is Negative.  Fact Sheet for Patients:  EntrepreneurPulse.com.au  Fact Sheet for Healthcare Providers:  IncredibleEmployment.be  This test is no t yet approved or cleared by the [place] FDA and  has been authorized for detection and/or diagnosis of SARS-CoV-2 by FDA under an Emergency Use Authorization (EUA). This EUA will remain  in effect (meaning this test can be used) for the duration of the COVID-19 declaration under Section 564(b)(1) of the Act, 21 U.S.C.section 360bbb-3(b)(1), unless the authorization is terminated  or revoked sooner.       Influenza A by PCR NEGATIVE NEGATIVE Final   Influenza B by PCR NEGATIVE NEGATIVE Final    Comment: (NOTE) The Xpert Xpress SARS-CoV-2/FLU/RSV plus assay is intended as an aid in the diagnosis of influenza from Nasopharyngeal swab specimens and should not be used as a sole basis for treatment. Nasal washings and aspirates are unacceptable for Xpert Xpress SARS-CoV-2/FLU/RSV testing.  Fact Sheet for Patients: EntrepreneurPulse.com.au  Fact Sheet for Healthcare Providers: IncredibleEmployment.be  This test is not yet approved or cleared by the [place] FDA and has been authorized for detection and/or diagnosis of SARS-CoV-2 by FDA under an Emergency Use Authorization (EUA). This EUA will remain in effect (meaning this test can be used) for the duration of the COVID-19 declaration under Section 564(b)(1) of the Act, 21 U.S.C. section 360bbb-3(b)(1), unless the authorization is terminated or revoked.  Performed at [facility], [address], [place], [place] [address]     RADIOLOGY:  No results found.   CODE STATUS:     Code Status Orders  (From admission, onward)           Start     Ordered   04/13/21 1247  Full code   Continuous        04/13/21 1250           Code Status History     Date Active Date Inactive Code Status Order ID Comments User Context   01/28/2021 1130 02/14/2021 1816 Partial Code [id number]  Flora Lipps, MD Inpatient   01/24/2021 1244 01/28/2021 1130 Full Code [id number]  Collier Bullock, MD ED   02/04/2019 1629 02/06/2019 1515 Full Code [id number]  Salary, Avel Peace, MD Inpatient        TOTAL TIME TAKING CARE OF THIS PATIENT: 35 minutes.    [name] M.D  [facility]    CC: Primary care physician; [name], MD

## 2021-04-20 NOTE — Progress Notes (Signed)
     Referral received for Julian Sparks :goals of care discussion. Chart reviewed and updates received. Patient is being discharged home today (orders written) with daughter and home health (trilogy).   Recommend outpatient palliative support to initiate goals of care discussions.   Alda Lea, AGPCNP-BC Palliative Medicine Team  Phone: [phone] Pager: [phone] Amion: N. [place]   NO CHARGE

## 2021-04-20 NOTE — Care Management Important Message (Signed)
Important Message  Patient Details  Name: XZAIVER VAYDA MRN: [id number] Date of Birth: [birth year]   Medicare Important Message Given:  No  Patient discharged prior to arrival to unit to deliver concurrent Medicare IM.   Dannette Barbara 04/20/2021, 1:17 PM

## 2021-04-20 NOTE — TOC Progression Note (Signed)
Transition of Care [facility]) - Progression Note    Patient Details  Name: Julian Sparks MRN: [id number] Date of Birth: [birth year]  [facility]) CM/SW Contact  Eileen Stanford, LCSW Phone Number: 04/20/2021, 9:43 AM  Clinical Narrative:   CSW spoke with pt's daughter Julian Sparks and she states they still are not interested in [facility]. Pt's daughter states [employer] is supposed to be calling her today to bring the trilogy. CSW has reached out to [place] with [employer] to determine a time.     Expected Discharge Plan: Home/Self Care Barriers to Discharge: Continued Medical Work up  Expected Discharge Plan and Services Expected Discharge Plan: Home/Self Care       Living arrangements for the past 2 months: Single Family Home                 DME Arranged: NIV DME Agency: [employer] Date DME Agency Contacted: 04/14/21   Representative spoke with at DME Agency: [place] ([place]) Interventions    Readmission Risk Interventions Readmission Risk Prevention Plan 04/14/2021  Transportation Screening Complete  Medication Review [employer]) Complete  PCP or Specialist appointment within 3-5 days of discharge Complete  HRI or Home Care Consult Complete  SW Recovery Care/Counseling Consult Complete  Palliative Care Screening Not [place] Complete  Some recent data might be hidden

## 2021-04-21 ENCOUNTER — Telehealth: Payer: Self-pay

## 2021-04-21 ENCOUNTER — Other Ambulatory Visit: Payer: Self-pay

## 2021-04-21 DIAGNOSIS — J9610 Chronic respiratory failure, unspecified whether with hypoxia or hypercapnia: Secondary | ICD-10-CM | POA: Diagnosis not present

## 2021-04-21 DIAGNOSIS — J449 Chronic obstructive pulmonary disease, unspecified: Secondary | ICD-10-CM | POA: Diagnosis not present

## 2021-04-21 MED ORDER — METOPROLOL SUCCINATE ER 25 MG PO TB24: ORAL_TABLET | ORAL | 3 refills | Status: DC

## 2021-04-21 NOTE — Telephone Encounter (Signed)
   Call to patient and also called daughter [name] to make them aware of new appointment date and time, September 2 at 9:30 AM.  They both voice that he will be able to make that appointment time.  Pricilla Riffle RN CHFN

## 2021-04-24 ENCOUNTER — Other Ambulatory Visit: Payer: Self-pay | Admitting: Internal Medicine

## 2021-04-24 DIAGNOSIS — Z9981 Dependence on supplemental oxygen: Secondary | ICD-10-CM | POA: Diagnosis not present

## 2021-04-24 DIAGNOSIS — J449 Chronic obstructive pulmonary disease, unspecified: Secondary | ICD-10-CM | POA: Diagnosis not present

## 2021-04-24 DIAGNOSIS — J9611 Chronic respiratory failure with hypoxia: Secondary | ICD-10-CM | POA: Diagnosis not present

## 2021-04-26 ENCOUNTER — Ambulatory Visit: Payer: Medicare HMO | Admitting: Family

## 2021-04-28 ENCOUNTER — Encounter: Payer: Self-pay | Admitting: Family

## 2021-04-28 ENCOUNTER — Other Ambulatory Visit: Payer: Self-pay

## 2021-04-28 ENCOUNTER — Ambulatory Visit: Payer: Medicare HMO | Admitting: Podiatry

## 2021-04-28 ENCOUNTER — Ambulatory Visit: Payer: Medicare HMO | Attending: Family | Admitting: Family

## 2021-04-28 VITALS — BP 158/68 | HR 104 | Resp 16 | Ht 66.0 in | Wt 199.0 lb

## 2021-04-28 DIAGNOSIS — Z7984 Long term (current) use of oral hypoglycemic drugs: Secondary | ICD-10-CM | POA: Insufficient documentation

## 2021-04-28 DIAGNOSIS — Z09 Encounter for follow-up examination after completed treatment for conditions other than malignant neoplasm: Secondary | ICD-10-CM | POA: Insufficient documentation

## 2021-04-28 DIAGNOSIS — I5032 Chronic diastolic (congestive) heart failure: Secondary | ICD-10-CM | POA: Diagnosis not present

## 2021-04-28 DIAGNOSIS — E119 Type 2 diabetes mellitus without complications: Secondary | ICD-10-CM | POA: Diagnosis not present

## 2021-04-28 DIAGNOSIS — J441 Chronic obstructive pulmonary disease with (acute) exacerbation: Secondary | ICD-10-CM | POA: Insufficient documentation

## 2021-04-28 DIAGNOSIS — I110 Hypertensive heart disease with heart failure: Secondary | ICD-10-CM | POA: Insufficient documentation

## 2021-04-28 DIAGNOSIS — Z886 Allergy status to analgesic agent status: Secondary | ICD-10-CM | POA: Diagnosis not present

## 2021-04-28 DIAGNOSIS — J449 Chronic obstructive pulmonary disease, unspecified: Secondary | ICD-10-CM

## 2021-04-28 DIAGNOSIS — E08621 Diabetes mellitus due to underlying condition with foot ulcer: Secondary | ICD-10-CM | POA: Diagnosis not present

## 2021-04-28 DIAGNOSIS — Z7901 Long term (current) use of anticoagulants: Secondary | ICD-10-CM | POA: Insufficient documentation

## 2021-04-28 DIAGNOSIS — Z79899 Other long term (current) drug therapy: Secondary | ICD-10-CM | POA: Insufficient documentation

## 2021-04-28 DIAGNOSIS — J440 Chronic obstructive pulmonary disease with acute lower respiratory infection: Secondary | ICD-10-CM | POA: Diagnosis not present

## 2021-04-28 DIAGNOSIS — L97522 Non-pressure chronic ulcer of other part of left foot with fat layer exposed: Secondary | ICD-10-CM | POA: Diagnosis not present

## 2021-04-28 DIAGNOSIS — Z880 Allergy status to penicillin: Secondary | ICD-10-CM | POA: Insufficient documentation

## 2021-04-28 DIAGNOSIS — R0602 Shortness of breath: Secondary | ICD-10-CM | POA: Insufficient documentation

## 2021-04-28 DIAGNOSIS — Z8249 Family history of ischemic heart disease and other diseases of the circulatory system: Secondary | ICD-10-CM | POA: Insufficient documentation

## 2021-04-28 DIAGNOSIS — I739 Peripheral vascular disease, unspecified: Secondary | ICD-10-CM | POA: Diagnosis not present

## 2021-04-28 DIAGNOSIS — I10 Essential (primary) hypertension: Secondary | ICD-10-CM

## 2021-04-28 DIAGNOSIS — Z7951 Long term (current) use of inhaled steroids: Secondary | ICD-10-CM | POA: Diagnosis not present

## 2021-04-28 DIAGNOSIS — Z7982 Long term (current) use of aspirin: Secondary | ICD-10-CM | POA: Insufficient documentation

## 2021-04-28 DIAGNOSIS — Z87891 Personal history of nicotine dependence: Secondary | ICD-10-CM | POA: Insufficient documentation

## 2021-04-28 MED ORDER — BENZONATATE 100 MG PO CAPS: 100.0000 mg | ORAL_CAPSULE | Freq: Three times a day (TID) | ORAL | 1 refills | Status: DC | PRN

## 2021-04-28 NOTE — Progress Notes (Signed)
Patient ID: Julian Sparks, male    DOB: 06-12-1946, 75 y.o.   MRN: [id number]  HPI  [name] is a 75 y/o male with a history of DM, HTN, COPD, previous tobacco use and chronic heart failure.   Echo report from 01/24/21 reviewed and showed an EF of 50-55% with severe RAE but without LVH.   Admitted 04/13/21 due to worsening shortness of breath due to COPD exacerbation and bilateral pneumonia. Started using NIV/trilogy. Antibiotic treatment finished and prednisone taper given. Diuresed 14L with IV lasix with transition to oral diuretics. AMS improved after CO2 retention corrected. Discharged after 7 days. Admitted 01/24/21 due to acute heart failure. Placed on bipap due to worsening SOB. Given steroids due to COPD. Cardiology, wound and med onc consults obtained. Aggressively diuresed. Antibiotics given for strep in his sputum. Eventually transitioned to nasal cannula oxygen. Modified barium swallow was negative. PT/OT evaluation done. SNF recommended but he refused. Discharged after 21 days.   He presents today for a follow-up visit with a chief complaint of moderate shortness of breath with little exertion. He describes this as chronic in nature having been present for several years although "much better" from last time he was here. He has associated fatigue, productive cough, wheezing (not much) & wound on his left foot along with this. He denies any difficulty sleeping, dizziness, abdominal distention, palpitations, pedal edema, chest pain or weight gain.   He says that he feels so much better since using the NIV/ trilogy machine every night at bedtime and during the day as well if he thinks he may fall asleep. Wearing his oxygen at 4L around the clock.   He asks for RX for tessalon perles to be sent to local pharmacy as they are still waiting on mail order to arrive and haven't heard anything about when it is supposed to be shipped.   Past Medical History:  Diagnosis Date   CHF (congestive heart  failure) (HCC)    Chronic bronchitis (HCC)    COPD (chronic obstructive pulmonary disease) (HCC)    Diabetes mellitus without complication (HCC)    Diastolic dysfunction    Dyspnea    with exertion   Hypertension    Past Surgical History:  Procedure Laterality Date   CIRCUMCISION     SHOULDER ARTHROSCOPY WITH OPEN ROTATOR CUFF REPAIR Left 06/20/2016   Procedure: SHOULDER ARTHROSCOPY WITH OPEN ROTATOR CUFF REPAIR;  Surgeon: Earnestine Leys, MD;  Location: [facility] ORS;  Service: Orthopedics;  Laterality: Left;   UPPER GI ENDOSCOPY     Family History  Problem Relation Age of Onset   Hypertension Mother    Social History   Tobacco Use   Smoking status: Former   Smokeless tobacco: Former    Types: Chew  Substance Use Topics   Alcohol use: No   Allergies  Allergen Reactions   Aspirin Nausea Only and Other (See Comments)    325 mg aspirin cause stomach upset   Penicillins Rash   Prior to Admission medications   Medication Sig Start Date End Date Taking? Authorizing Provider  ACCU-CHEK AVIVA PLUS test strip TEST BLOOD SUGAR TWICE DAILY 11/01/20  Yes Masoud, [name], MD  Accu-Chek Softclix Lancets lancets Check blood sugar two times daily 11/01/20  Yes Masoud, [name], MD  albuterol (VENTOLIN HFA) 108 (90 Base) MCG/ACT inhaler Inhale 2 puffs into the lungs as needed for wheezing or shortness of breath. 11/09/20  Yes [name], MD  Alcohol Swabs (B-D SINGLE USE SWABS REGULAR) PADS USE  AS DIRECTED 12/27/20  Yes Masoud, [name], MD  aspirin 81 MG EC tablet Take 81 mg by mouth daily. Swallow whole.    Yes [provider]  atorvastatin (LIPITOR) 40 MG tablet TAKE 1 TABLET EVERY DAY 02/28/21  Yes Masoud, [name], MD  B Complex-C (SUPER B COMPLEX PO) Take 1 tablet by mouth daily.    Yes [provider]  benzonatate (TESSALON PERLES) 100 MG capsule Take 1 capsule (100 mg total) by mouth 3 (three) times daily as needed for cough. 04/28/21  Yes Darylene Price A, FNP  Blood Glucose Monitoring  Suppl (ACCU-CHEK AVIVA PLUS) w/Device KIT Check blood sugar BID 11/01/20  Yes Masoud, [name], MD  budesonide-formoterol (SYMBICORT) 160-4.5 MCG/ACT inhaler Inhale 2 puffs into the lungs 2 (two) times daily. 11/01/20  Yes Masoud, [name], MD  cyclobenzaprine (FLEXERIL) 10 MG tablet Take 10 mg by mouth at bedtime as needed for muscle spasms.   Yes [provider]  fluticasone (FLONASE) 50 MCG/ACT nasal spray Place 1 spray into both nostrils 2 (two) times a day.  11/04/18  Yes [provider]  furosemide (LASIX) 20 MG tablet TAKE 2 TABLETS EVERY DAY 12/12/20  Yes Masoud, [name], MD  gabapentin (NEURONTIN) 300 MG capsule Take 1 capsule (300 mg total) by mouth 3 (three) times daily. 09/13/20  Yes Masoud, [name], MD  gentamicin cream (GARAMYCIN) 0.1 % Apply 1 application topically 2 (two) times daily. 03/20/21  Yes Edrick Kins, DPM  guaiFENesin (ROBITUSSIN) 100 MG/5ML SOLN Take 5 mLs (100 mg total) by mouth every 4 (four) hours as needed for cough or to loosen phlegm. 02/14/21  Yes Sharen Hones, MD  ipratropium-albuterol (DUONEB) 0.5-2.5 (3) MG/3ML SOLN INHALE THE CONTENTS OF 1 VIAL VIA NEBULIZER EVERY 4 HOURS AS NEEDED 03/16/21  Yes Masoud, [name], MD  JARDIANCE 10 MG TABS tablet TAKE 1 TABLET(10 MG) BY MOUTH DAILY 04/13/21  Yes Masoud, [name], MD  metFORMIN (GLUCOPHAGE) 500 MG tablet TAKE 1 TABLET TWICE DAILY 03/16/21  Yes Masoud, [name], MD  metoprolol succinate (TOPROL-XL) 25 MG 24 hr tablet TAKE 1 TABLET(25 MG) BY MOUTH DAILY 04/21/21  Yes Masoud, [name], MD  Omega-3 Fatty Acids (FISH OIL) 1000 MG CAPS Take 2 capsules by mouth 2 (two) times daily.   Yes [provider]  Pseudoeph-Doxylamine-DM-APAP (NYQUIL PO) Take by mouth as needed.   Yes [provider]  traZODone (DESYREL) 50 MG tablet TAKE 2 TABLETS EVERY DAY Patient taking differently: Take 100 mg by mouth at bedtime. 07/28/20  Yes Masoud, [name], MD  benzonatate (TESSALON) 100 MG capsule Take 1 capsule (100 mg total) by mouth 3  (three) times daily. Patient not taking: Reported on 04/28/2021 04/20/21   [name], MD  predniSONE (DELTASONE) 10 MG tablet Take 1 tablet (10 mg total) by mouth daily with breakfast. Patient not taking: Reported on 04/28/2021 04/21/21   [name], MD    Review of Systems  Constitutional:  Positive for fatigue. Negative for appetite change.  HENT:  Negative for congestion, postnasal drip and sore throat.   Eyes: Negative.   Respiratory:  Positive for cough (productive of green sputum), shortness of breath (improving) and wheezing.   Cardiovascular:  Negative for chest pain, palpitations and leg swelling.  Gastrointestinal:  Negative for abdominal distention and abdominal pain.  Endocrine: Negative.   Genitourinary: Negative.   Musculoskeletal:  Positive for arthralgias (left foot). Negative for back pain and neck pain.  Skin:  Positive for wound (bottom of left foot).  Allergic/Immunologic: Negative.  Neurological:  Negative for dizziness and light-headedness.  Hematological:  Negative for adenopathy. Bruises/bleeds easily.  Psychiatric/Behavioral:  Negative for dysphoric mood and sleep disturbance (sleeping on 2 pillows; oxygen at 4L around the clock; using NIV trilogy). The patient is not nervous/anxious.    Vitals:   04/28/21 0911  BP: (!) 158/68  Pulse: (!) 104  Resp: 16  SpO2: 96%  Weight: 199 lb (90.3 kg)  Height: 5' 6" (1.676 m)   Wt Readings from Last 3 Encounters:  04/28/21 199 lb (90.3 kg)  04/20/21 198 lb 3.2 oz (89.9 kg)  03/22/21 207 lb 5.8 oz (94.1 kg)   Lab Results  Component Value Date   CREATININE 0.85 04/20/2021   CREATININE 0.91 04/18/2021   CREATININE 0.68 04/17/2021   Physical Exam Vitals and nursing note reviewed. Exam conducted with a chaperone present (daughter & granddaughter).  Constitutional:      Appearance: Normal appearance.  HENT:     Head: Normocephalic and atraumatic.  Cardiovascular:     Rate and Rhythm: Regular rhythm. Tachycardia  present.  Pulmonary:     Effort: Pulmonary effort is normal. No respiratory distress.     Breath sounds: No wheezing or rales.  Abdominal:     General: There is no distension.     Palpations: Abdomen is soft.     Tenderness: There is no abdominal tenderness.  Musculoskeletal:        General: No tenderness.     Cervical back: Normal range of motion and neck supple.     Right lower leg: Edema (trace pitting) present.     Left lower leg: Edema (trace pitting) present.  Skin:    General: Skin is warm and dry.     Findings: Ecchymosis (right side of face) present.  Neurological:     General: No focal deficit present.     Mental Status: He is alert and oriented to person, place, and time.  Psychiatric:        Mood and Affect: Mood normal.        Behavior: Behavior normal.        Thought Content: Thought content normal.   Assessment & Plan:  1: Chronic heart failure with preserved ejection fraction without LVH/LAE- - NYHA class III (although improved) - euvolemic today - weighing daily; reminded to call for an overnight weight gain of > 2 pounds or a weekly weight gain of > 5 pounds - weight 199 pounds today; did not weigh here last time due to acuity of his health  - not adding salt to his food and his daughter rinses any canned foods out; explained that he needed to keep his daily sodium intake to <2045m/ day - saw cardiology (Rockey Situ 03/21/21; returns 05/18/21 - encouraged to elevate his legs when sitting for long periods of time; edema markedly improved - BNP 02/12/21 was 75.4  2: HTN- - BP mildly elevated today - saw PCP (Masoud) 03/22/21; returns 05/03/21 - BMP 02/14/21 reviewed and showed sodium 139, potassium 3.7, creatinine 0.73 and GFR >60  3: DM- - A1c 01/24/21 was 6.8% - says glucose was 141 at home  4: COPD- - wears oxgyen at 4L around the clock, continuous now (not pulse) - using NIV trilogy at bedtime as well as during the day and feels like that has made the biggest  difference - sees pulmonology (Patsey Berthold 05/09/21 - RX for tessalon perles sent to his local pharmacy   Patient did not bring his medications nor a list. Each medication  was verbally reviewed with the patient and he was encouraged to bring the bottles to every visit to confirm accuracy of list.   Return in 6 weeks or sooner for any questions/problems before then.

## 2021-04-28 NOTE — Progress Notes (Signed)
   Subjective:  75 y.o. male with PMHx of diabetes mellitus presenting for follow-up evaluation of an ulcer to the plantar aspect of the fifth MTPJ of the left foot which has been present for about 3-4 months.  He has been applying the antibiotic cream and he says that the pain has improved but he continues to have pain throughout the day.  Recently he spent 1 week in the hospital for unrelated conditions and he presents for follow-up treatment and evaluation    Past Medical History:  Diagnosis Date   CHF (congestive heart failure) ([place])    Chronic bronchitis (HCC)    COPD (chronic obstructive pulmonary disease) ([place])    Diabetes mellitus without complication ([place])    Diastolic dysfunction    Dyspnea    with exertion   Hypertension      Objective/Physical Exam General: The patient is alert and oriented x3 in no acute distress.  Dermatology:  Wound #1 noted to the subphase MTPJ left foot measuring approximately 0.8x0.8 x 0.2 cm (LxWxD).   To the noted ulceration(s), there is no eschar. There is minimal amount of slough, fibrin, and necrotic tissue noted. Granulation tissue and wound base is red. There is a minimal amount of serosanguineous drainage noted. There is no exposed bone muscle-tendon ligament or joint. There is no malodor. Periwound integrity is intact. Skin is warm, dry and supple bilateral lower extremities.  Vascular: Diminished pedal pulses bilaterally.  No erythema.  Moderate edema.  Capillary refill delayed  Neurological: Epicritic and protective threshold diminished bilaterally.   Musculoskeletal Exam: Range of motion within normal limits to all pedal and ankle joints bilateral. Muscle strength 5/5 in all groups bilateral.   Radiographic exam taken 04/07/2021: Currently there is no cortical erosion or concern for osteomyelitis.  Degenerative changes noted throughout the foot.  No fractures identified.  Assessment: 1.  Ulcer subfifth MTPJ left secondary to diabetes  mellitus 2. diabetes mellitus w/ peripheral neuropathy   Plan of Care:  1. Patient was evaluated. 2. Order placed is currently pending for arterial studies bilateral lower extremities at [facility].  Patient has history of PVD.  The patient's daughter states that since the patient was in the hospital for the past week they have not been able to arrange outpatient vascular studies.  She will do that promptly 3.  Continue gentamicin cream daily 4.  Continue postsurgical shoe with offloading felt pads 5.  Return to clinic after vascular studies for further treatment and evaluation   Edrick Kins, DPM [facility]  Dr. Edrick Kins, DPM    [address], [place] [address]                Office [phone]  Fax [phone]

## 2021-04-28 NOTE — Patient Instructions (Signed)
Continue weighing daily and call for an overnight weight gain of > 2 pounds or a weekly weight gain of >5 pounds. 

## 2021-05-02 ENCOUNTER — Ambulatory Visit: Payer: Medicare HMO | Admitting: Family

## 2021-05-03 ENCOUNTER — Encounter: Payer: Self-pay | Admitting: Internal Medicine

## 2021-05-03 ENCOUNTER — Ambulatory Visit (INDEPENDENT_AMBULATORY_CARE_PROVIDER_SITE_OTHER): Payer: Medicare HMO | Admitting: Internal Medicine

## 2021-05-03 ENCOUNTER — Other Ambulatory Visit: Payer: Self-pay

## 2021-05-03 VITALS — BP 141/69 | HR 73 | Ht 66.0 in | Wt 197.1 lb

## 2021-05-03 DIAGNOSIS — E139 Other specified diabetes mellitus without complications: Secondary | ICD-10-CM

## 2021-05-03 DIAGNOSIS — J441 Chronic obstructive pulmonary disease with (acute) exacerbation: Secondary | ICD-10-CM

## 2021-05-03 DIAGNOSIS — E1169 Type 2 diabetes mellitus with other specified complication: Secondary | ICD-10-CM

## 2021-05-03 DIAGNOSIS — J9611 Chronic respiratory failure with hypoxia: Secondary | ICD-10-CM

## 2021-05-03 DIAGNOSIS — I10 Essential (primary) hypertension: Secondary | ICD-10-CM | POA: Diagnosis not present

## 2021-05-03 DIAGNOSIS — E669 Obesity, unspecified: Secondary | ICD-10-CM | POA: Diagnosis not present

## 2021-05-03 LAB — GLUCOSE, POCT (MANUAL RESULT ENTRY): POC Glucose: 299 mg/dl — AB (ref 70–99)

## 2021-05-03 MED ORDER — GLIMEPIRIDE 2 MG PO TABS: 2.0000 mg | ORAL_TABLET | Freq: Every day | ORAL | 3 refills | Status: DC

## 2021-05-03 NOTE — Progress Notes (Signed)
Established Patient Office Visit  Subjective:  Patient ID: Julian Sparks, male    DOB: 02-26-1946  Age: 75 y.o. MRN: [id number]  CC:  Chief Complaint  Patient presents with   Hospitalization Follow-up    HPI  Julian Sparks presents for check up  Past Medical History:  Diagnosis Date   CHF (congestive heart failure) ([place])    Chronic bronchitis ([place])    COPD (chronic obstructive pulmonary disease) ([place])    Diabetes mellitus without complication ([place])    Diastolic dysfunction    Dyspnea    with exertion   Hypertension     Past Surgical History:  Procedure Laterality Date   CIRCUMCISION     SHOULDER ARTHROSCOPY WITH OPEN ROTATOR CUFF REPAIR Left 06/20/2016   Procedure: SHOULDER ARTHROSCOPY WITH OPEN ROTATOR CUFF REPAIR;  Surgeon: Earnestine Leys, MD;  Location: [facility] ORS;  Service: Orthopedics;  Laterality: Left;   UPPER GI ENDOSCOPY      Family History  Problem Relation Age of Onset   Hypertension Mother     Social History   Socioeconomic History   Marital status: Widowed    Spouse name: Not on file   Number of children: 2   Years of education: Not on file   Highest education level: Not on file  Occupational History   Not on file  Tobacco Use   Smoking status: Former   Smokeless tobacco: Former    Types: Chew  Substance and Sexual Activity   Alcohol use: No   Drug use: No   Sexual activity: Not on file  Other Topics Concern   Not on file  Social History Narrative   Not on file   Social Determinants of Health   Financial Resource Strain: Not on file  Food Insecurity: Not on file  Transportation Needs: Not on file  Physical Activity: Not on file  Stress: Not on file  Social Connections: Not on file  Intimate Partner Violence: Not on file     Current Outpatient Medications:    ACCU-CHEK AVIVA PLUS test strip, TEST BLOOD SUGAR TWICE DAILY, Disp: 300 strip, Rfl: 3   Accu-Chek Softclix Lancets lancets, Check blood sugar two times daily, Disp: 300  each, Rfl: 3   albuterol (VENTOLIN HFA) 108 (90 Base) MCG/ACT inhaler, Inhale 2 puffs into the lungs as needed for wheezing or shortness of breath., Disp: 18 g, Rfl: 3   Alcohol Swabs (B-D SINGLE USE SWABS REGULAR) PADS, USE AS DIRECTED, Disp: 300 each, Rfl: 3   aspirin 81 MG EC tablet, Take 81 mg by mouth daily. Swallow whole. , Disp: , Rfl:    atorvastatin (LIPITOR) 40 MG tablet, TAKE 1 TABLET EVERY DAY, Disp: 90 tablet, Rfl: 3   B Complex-C (SUPER B COMPLEX PO), Take 1 tablet by mouth daily. , Disp: , Rfl:    benzonatate (TESSALON PERLES) 100 MG capsule, Take 1 capsule (100 mg total) by mouth 3 (three) times daily as needed for cough., Disp: 90 capsule, Rfl: 1   benzonatate (TESSALON) 100 MG capsule, Take 1 capsule (100 mg total) by mouth 3 (three) times daily., Disp: 60 capsule, Rfl: 0   Blood Glucose Monitoring Suppl (ACCU-CHEK AVIVA PLUS) w/Device KIT, Check blood sugar BID, Disp: 1 kit, Rfl: 3   budesonide-formoterol (SYMBICORT) 160-4.5 MCG/ACT inhaler, Inhale 2 puffs into the lungs 2 (two) times daily., Disp: 3 each, Rfl: 3   cyclobenzaprine (FLEXERIL) 10 MG tablet, Take 10 mg by mouth at bedtime as needed for muscle spasms., Disp: ,  Rfl:    fluticasone (FLONASE) 50 MCG/ACT nasal spray, Place 1 spray into both nostrils 2 (two) times a day. , Disp: , Rfl:    furosemide (LASIX) 20 MG tablet, TAKE 2 TABLETS EVERY DAY, Disp: 180 tablet, Rfl: 6   gabapentin (NEURONTIN) 300 MG capsule, Take 1 capsule (300 mg total) by mouth 3 (three) times daily., Disp: 90 capsule, Rfl: 0   gentamicin cream (GARAMYCIN) 0.1 %, Apply 1 application topically 2 (two) times daily., Disp: 30 g, Rfl: 1   guaiFENesin (ROBITUSSIN) 100 MG/5ML SOLN, Take 5 mLs (100 mg total) by mouth every 4 (four) hours as needed for cough or to loosen phlegm., Disp: 236 mL, Rfl: 0   ipratropium-albuterol (DUONEB) 0.5-2.5 (3) MG/3ML SOLN, INHALE THE CONTENTS OF 1 VIAL VIA NEBULIZER EVERY 4 HOURS AS NEEDED, Disp: 90 mL, Rfl: 3   JARDIANCE 10  MG TABS tablet, TAKE 1 TABLET(10 MG) BY MOUTH DAILY, Disp: 30 tablet, Rfl: 0   metFORMIN (GLUCOPHAGE) 500 MG tablet, TAKE 1 TABLET TWICE DAILY, Disp: 180 tablet, Rfl: 3   metoprolol succinate (TOPROL-XL) 25 MG 24 hr tablet, TAKE 1 TABLET(25 MG) BY MOUTH DAILY, Disp: 90 tablet, Rfl: 3   Omega-3 Fatty Acids (FISH OIL) 1000 MG CAPS, Take 2 capsules by mouth 2 (two) times daily., Disp: , Rfl:    predniSONE (DELTASONE) 10 MG tablet, Take 1 tablet (10 mg total) by mouth daily with breakfast., Disp: 30 tablet, Rfl: 2   Pseudoeph-Doxylamine-DM-APAP (NYQUIL PO), Take by mouth as needed., Disp: , Rfl:    traZODone (DESYREL) 50 MG tablet, TAKE 2 TABLETS EVERY DAY (Patient taking differently: Take 100 mg by mouth at bedtime.), Disp: 180 tablet, Rfl: 3   Allergies  Allergen Reactions   Aspirin Nausea Only and Other (See Comments)    325 mg aspirin cause stomach upset   Penicillins Rash    ROS Review of Systems  Constitutional: Negative.   HENT: Negative.    Eyes: Negative.   Respiratory: Negative.    Cardiovascular: Negative.   Gastrointestinal: Negative.   Endocrine: Negative.   Genitourinary: Negative.   Musculoskeletal: Negative.   Skin: Negative.   Allergic/Immunologic: Negative.   Neurological: Negative.   Hematological: Negative.   Psychiatric/Behavioral: Negative.    All other systems reviewed and are negative.    Objective:    Physical Exam Vitals reviewed.  Constitutional:      Appearance: Normal appearance.  HENT:     Mouth/Throat:     Mouth: Mucous membranes are moist.  Eyes:     Pupils: Pupils are equal, round, and reactive to light.  Neck:     Vascular: No carotid bruit.  Cardiovascular:     Rate and Rhythm: Normal rate and regular rhythm.     Pulses: Normal pulses.     Heart sounds: Normal heart sounds.  Pulmonary:     Effort: Pulmonary effort is normal.     Breath sounds: Normal breath sounds.  Abdominal:     General: Bowel sounds are normal.     Palpations:  Abdomen is soft. There is no hepatomegaly, splenomegaly or mass.     Tenderness: There is no abdominal tenderness.     Hernia: No hernia is present.  Musculoskeletal:     Cervical back: Neck supple.     Right lower leg: No edema.     Left lower leg: No edema.  Skin:    Findings: No rash.  Neurological:     Mental Status: He is alert and oriented  to person, place, and time.     Motor: No weakness.  Psychiatric:        Mood and Affect: Mood normal.        Behavior: Behavior normal.    BP (!) 141/69   Pulse 73   Ht 5' 6"  (1.676 m)   Wt 197 lb 1.6 oz (89.4 kg)   SpO2 96% Comment: 4 liters  BMI 31.81 kg/m  Wt Readings from Last 3 Encounters:  05/03/21 197 lb 1.6 oz (89.4 kg)  04/28/21 199 lb (90.3 kg)  04/20/21 198 lb 3.2 oz (89.9 kg)     Health Maintenance Due  Topic Date Due   FOOT EXAM  Never done   OPHTHALMOLOGY EXAM  Never done   URINE MICROALBUMIN  Never done   Hepatitis C Screening  Never done   TETANUS/TDAP  Never done   Zoster Vaccines- Shingrix (2 of 2) 03/21/2015   COVID-19 Vaccine (3 - Pfizer risk series) 01/13/2020   INFLUENZA VACCINE  03/27/2021    There are no preventive care reminders to display for this patient.  Lab Results  Component Value Date   TSH 1.152 01/24/2021   Lab Results  Component Value Date   WBC 8.5 04/17/2021   HGB 9.8 (L) 04/17/2021   HCT 35.0 (L) 04/17/2021   MCV 93.8 04/17/2021   PLT 233 04/17/2021   Lab Results  Component Value Date   NA 139 04/18/2021   K 4.2 04/18/2021   CO2 40 (H) 04/18/2021   GLUCOSE 138 (H) 04/18/2021   BUN 33 (H) 04/18/2021   CREATININE 0.85 04/20/2021   BILITOT 1.0 04/13/2021   ALKPHOS 74 04/13/2021   AST 18 04/13/2021   ALT 16 04/13/2021   PROT 7.3 04/13/2021   ALBUMIN 3.8 04/13/2021   CALCIUM 8.4 (L) 04/18/2021   ANIONGAP 9 04/18/2021   Lab Results  Component Value Date   CHOL 101 01/26/2021   Lab Results  Component Value Date   HDL 46 01/26/2021   Lab Results  Component Value  Date   LDLCALC 36 01/26/2021   Lab Results  Component Value Date   TRIG 97 01/26/2021   Lab Results  Component Value Date   CHOLHDL 2.2 01/26/2021   Lab Results  Component Value Date   HGBA1C 6.8 (H) 04/14/2021      Assessment & Plan:   Problem List Items Addressed This Visit       Cardiovascular and Mediastinum   Essential hypertension    - I encouraged the patient to lose weight.  - I educated them on making healthy dietary choices including eating more fruits and vegetables and less fried foods. - I encouraged the patient to exercise more, and educated on the benefits of exercise including weight loss, diabetes prevention, and hypertension prevention.   Dietary counseling with a registered dietician  Referral to a weight management support group (e.g. Weight Watchers, Overeaters Anonymous)  If your BMI is greater than 29 or you have gained more than 15 pounds you should work on weight loss.  Attend a healthy cooking class         Respiratory   Respiratory failure ([place])    Patient was advised to follow-up with lung specialist on regular basis, he is on oxygen oxygen saturation is satisfactory      COPD with acute exacerbation ([place])    Patient is on breathing machine        Endocrine   Diabetes 1.5, managed as type 2 ([place])    -  The patient's blood sugar is labile on med. - The patient will continue the current treatment regimen.  - I encouraged the patient to regularly check blood sugar.  - I encouraged the patient to monitor diet. I encouraged the patient to eat low-carb and low-sugar to help prevent blood sugar spikes.  - I encouraged the patient to continue following their prescribed treatment plan for diabetes - I informed the patient to get help if blood sugar drops below 11m/dL, or if suddenly have trouble thinking clearly or breathing.  Patient was advised to buy a book on diabetes from a local bookstore or from [place]  Patient should read 2 chapters  every day to keep the motivation going, this is in addition to some of the materials we provided them from the office.  There are other resources on the Internet like YouTube and wilkipedia to get an education on the diabetes        Other   Obesity (BMI 30.0-34.9)    - I encouraged the patient to lose weight.  - I educated them on making healthy dietary choices including eating more fruits and vegetables and less fried foods. - I encouraged the patient to exercise more, and educated on the benefits of exercise including weight loss, diabetes prevention, and hypertension prevention.   Dietary counseling with a registered dietician  Referral to a weight management support group (e.g. Weight Watchers, Overeaters Anonymous)  If your BMI is greater than 29 or you have gained more than 15 pounds you should work on weight loss.  Attend a healthy cooking class       Other Visit Diagnoses     Type 2 diabetes mellitus with other specified complication, unspecified whether long term insulin use ([place]    -  Primary   Relevant Orders   POCT glucose (manual entry) (Completed)       No orders of the defined types were placed in this encounter.   Follow-up: No follow-ups on file.    JCletis Athens MD

## 2021-05-03 NOTE — Assessment & Plan Note (Signed)
Patient was advised to follow-up with lung specialist on regular basis, he is on oxygen oxygen saturation is satisfactory

## 2021-05-03 NOTE — Assessment & Plan Note (Signed)

## 2021-05-03 NOTE — Assessment & Plan Note (Signed)
Patient is on breathing machine

## 2021-05-03 NOTE — Assessment & Plan Note (Signed)

## 2021-05-05 ENCOUNTER — Other Ambulatory Visit: Payer: Self-pay

## 2021-05-05 ENCOUNTER — Ambulatory Visit (INDEPENDENT_AMBULATORY_CARE_PROVIDER_SITE_OTHER): Payer: Medicare HMO

## 2021-05-05 DIAGNOSIS — Z0000 Encounter for general adult medical examination without abnormal findings: Secondary | ICD-10-CM | POA: Diagnosis not present

## 2021-05-05 NOTE — Progress Notes (Signed)
Subjective:   Julian Sparks is a 75 y.o. male who presents for Medicare Annual/Subsequent preventive examination. Visit completed by audio.  Patient location: Home Provider location: Home  Review of Systems    N/A       Objective:    Today's Vitals   05/05/21 1003  PainSc: 0-No pain   There is no height or weight on file to calculate BMI.  Advanced Directives 05/05/2021 04/13/2021 04/13/2021 01/25/2021 01/24/2021 04/03/2019 03/27/2019  Does Patient Have a Medical Advance Directive? No No No No No No No  Would patient like information on creating a medical advance directive? No - Patient declined No - Patient declined No - Patient declined No - Patient declined No - Patient declined Yes (MAU/Ambulatory/Procedural Areas - Information given) Yes (MAU/Ambulatory/Procedural Areas - Information given)    Current Medications (verified) Outpatient Encounter Medications as of 05/05/2021  Medication Sig   ACCU-CHEK AVIVA PLUS test strip TEST BLOOD SUGAR TWICE DAILY   Accu-Chek Softclix Lancets lancets Check blood sugar two times daily   albuterol (VENTOLIN HFA) 108 (90 Base) MCG/ACT inhaler Inhale 2 puffs into the lungs as needed for wheezing or shortness of breath.   Alcohol Swabs (B-D SINGLE USE SWABS REGULAR) PADS USE AS DIRECTED   aspirin 81 MG EC tablet Take 81 mg by mouth daily. Swallow whole.    atorvastatin (LIPITOR) 40 MG tablet TAKE 1 TABLET EVERY DAY   B Complex-C (SUPER B COMPLEX PO) Take 1 tablet by mouth daily.    benzonatate (TESSALON PERLES) 100 MG capsule Take 1 capsule (100 mg total) by mouth 3 (three) times daily as needed for cough.   Blood Glucose Monitoring Suppl (ACCU-CHEK AVIVA PLUS) w/Device KIT Check blood sugar BID   budesonide-formoterol (SYMBICORT) 160-4.5 MCG/ACT inhaler Inhale 2 puffs into the lungs 2 (two) times daily.   cyclobenzaprine (FLEXERIL) 10 MG tablet Take 10 mg by mouth at bedtime as needed for muscle spasms.   fluticasone (FLONASE) 50 MCG/ACT nasal  spray Place 1 spray into both nostrils 2 (two) times a day.    furosemide (LASIX) 20 MG tablet TAKE 2 TABLETS EVERY DAY   gabapentin (NEURONTIN) 300 MG capsule Take 1 capsule (300 mg total) by mouth 3 (three) times daily.   gentamicin cream (GARAMYCIN) 0.1 % Apply 1 application topically 2 (two) times daily.   glimepiride (AMARYL) 2 MG tablet Take 1 tablet (2 mg total) by mouth daily before breakfast.   guaiFENesin (ROBITUSSIN) 100 MG/5ML SOLN Take 5 mLs (100 mg total) by mouth every 4 (four) hours as needed for cough or to loosen phlegm.   ipratropium-albuterol (DUONEB) 0.5-2.5 (3) MG/3ML SOLN INHALE THE CONTENTS OF 1 VIAL VIA NEBULIZER EVERY 4 HOURS AS NEEDED   JARDIANCE 10 MG TABS tablet TAKE 1 TABLET(10 MG) BY MOUTH DAILY   metFORMIN (GLUCOPHAGE) 500 MG tablet TAKE 1 TABLET TWICE DAILY   metoprolol succinate (TOPROL-XL) 25 MG 24 hr tablet TAKE 1 TABLET(25 MG) BY MOUTH DAILY   Omega-3 Fatty Acids (FISH OIL) 1000 MG CAPS Take 2 capsules by mouth 2 (two) times daily.   predniSONE (DELTASONE) 10 MG tablet Take 1 tablet (10 mg total) by mouth daily with breakfast.   Pseudoeph-Doxylamine-DM-APAP (NYQUIL PO) Take by mouth as needed.   traZODone (DESYREL) 50 MG tablet TAKE 2 TABLETS EVERY DAY (Patient taking differently: Take 100 mg by mouth at bedtime.)   [DISCONTINUED] benzonatate (TESSALON) 100 MG capsule Take 1 capsule (100 mg total) by mouth 3 (three) times daily. (Patient not  taking: Reported on 05/05/2021)   No facility-administered encounter medications on file as of 05/05/2021.    Allergies (verified) Aspirin and Penicillins   History: Past Medical History:  Diagnosis Date   CHF (congestive heart failure) (HCC)    Chronic bronchitis (HCC)    COPD (chronic obstructive pulmonary disease) (HCC)    Diabetes mellitus without complication (HCC)    Diastolic dysfunction    Dyspnea    with exertion   Hypertension    Past Surgical History:  Procedure Laterality Date   CIRCUMCISION      SHOULDER ARTHROSCOPY WITH OPEN ROTATOR CUFF REPAIR Left 06/20/2016   Procedure: SHOULDER ARTHROSCOPY WITH OPEN ROTATOR CUFF REPAIR;  Surgeon: Earnestine Leys, MD;  Location: [facility] ORS;  Service: Orthopedics;  Laterality: Left;   UPPER GI ENDOSCOPY     Family History  Problem Relation Age of Onset   Hypertension Mother    Cancer Sister    Diabetes Brother    Social History   Socioeconomic History   Marital status: Widowed    Spouse name: Not on file   Number of children: 2   Years of education: 9   Highest education level: Some college, no degree  Occupational History   Not on file  Tobacco Use   Smoking status: Former   Smokeless tobacco: Former    Types: Nurse, children's Use: Never used  Substance and Sexual Activity   Alcohol use: No   Drug use: No   Sexual activity: Not Currently  Other Topics Concern   Not on file  Social History Narrative   Not on file   Social Determinants of Health   Financial Resource Strain: Low Risk    Difficulty of Paying Living Expenses: Not hard at all  Food Insecurity: No Food Insecurity   Worried About Charity fundraiser in the Last Year: Never true   Arboriculturist in the Last Year: Never true  Transportation Needs: No Transportation Needs   Lack of Transportation (Medical): No   Lack of Transportation (Non-Medical): No  Physical Activity: Sufficiently Active   Days of Exercise per Week: 7 days   Minutes of Exercise per Session: 40 min  Stress: No Stress Concern Present   Feeling of Stress : Not at all  Social Connections: Socially Isolated   Frequency of Communication with Friends and Family: More than three times a week   Frequency of Social Gatherings with Friends and Family: More than three times a week   Attends Religious Services: Never   Marine scientist or Organizations: No   Attends Archivist Meetings: Never   Marital Status: Widowed    Tobacco Counseling Counseling given: Not  Answered   Clinical Intake:   Pre-visit preparation completed: Yes  Pain : No/denies pain Pain Score: 0-No pain Pain Type: Other (Comment) Pain Location: Other (Comment)     BMI - recorded: 38.81 Nutritional Status: BMI > 30  Obese Nutritional Risks: Failure to thrive Diabetes: Yes  How often do you need to have someone help you when you read instructions, pamphlets, or other written materials from your doctor or pharmacy?: 1 - Never What is the last grade level you completed in school?: Certification program  Diabetic? Yes   Interpreter Needed?: No  Information entered by :: Anson [place] CMA   Activities of Daily Living In your present state of health, do you have any difficulty performing the following activities: 05/05/2021 04/14/2021  Hearing? Aggie Moats  Vision? N N  Difficulty concentrating or making decisions? N N  Walking or climbing stairs? Y Y  Comment Pt just has breathing difficulties which causes pt to have to go slow -  Dressing or bathing? N Y  Doing errands, shopping? N Y  Conservation officer, nature and eating ? N -  Using the Toilet? N -  In the past six months, have you accidently leaked urine? N -  Do you have problems with loss of bowel control? N -  Managing your Medications? N -  Managing your Finances? N -  Housekeeping or managing your Housekeeping? N -  Some recent data might be hidden    Patient Care Team: [name], MD as PCP - General (Internal Medicine) Minna Merritts, MD as PCP - Cardiology (Cardiology) Sindy Guadeloupe, MD as Consulting Physician (Hematology and Oncology)  Indicate any recent Medical Services you may have received from other than [facility] providers in the past year (date may be approximate).     Assessment:   This is a routine wellness examination for Dalyn.  Hearing/Vision screen No results found.  Dietary issues and exercise activities discussed:     Goals Addressed   None    Depression Screen PHQ 06-Oct-2022 Scores  05/05/2021 03/22/2021  PHQ - 2 Score 0 0    Fall Risk Fall Risk  05/05/2021 04/13/2021 04/13/2021 03/22/2021 04/27/2020  Falls in the past year? 1 Exclusion - non ambulatory 1 - 1  Number falls in past yr: 0 - 0 1 0  Injury with Fall? 1 - 1 1 0  Comment Patient fell and hit head and face by left eye, bruising - - - -  Risk for fall due to : History of fall(s) - History of fall(s);Impaired balance/gait History of fall(s) -  Follow up Falls evaluation completed - Falls evaluation completed Falls evaluation completed -    FALL RISK PREVENTION PERTAINING TO THE HOME:  Any stairs in or around the home? No  If so, are there any without handrails? Yes  Home free of loose throw rugs in walkways, pet beds, electrical cords, etc? Yes  Adequate lighting in your home to reduce risk of falls? Yes   ASSISTIVE DEVICES UTILIZED TO PREVENT FALLS:  Life alert? No  Use of a cane, walker or w/c? No  Grab bars in the bathroom? Yes  Shower chair or bench in shower? Yes  Elevated toilet seat or a handicapped toilet? Yes   TIMED UP AND GO:  Was the test performed? No .  Length of time to ambulate 10 feet: 0 sec.     Cognitive Function:     6CIT Screen 05/05/2021  What Year? 0 points  What month? 0 points  What time? 0 points  Count back from 20 0 points  Months in reverse 0 points  Repeat phrase 0 points  Total Score 0    Immunizations Immunization History  Administered Date(s) Administered   Fluad Quad(high Dose 65+) 05/11/2020   Influenza,inj,quad, With Preservative 06/27/2016   PFIZER(Purple Top)SARS-COV-2 Vaccination 11/23/2019, 12/16/2019   Pneumococcal Conjugate-13 04/16/2014   Pneumococcal-Unspecified 05/23/2014   Zoster Recombinat (Shingrix) 01/24/2015    TDAP status: Due, Education has been provided regarding the importance of this vaccine. Advised may receive this vaccine at local pharmacy or Health Dept. Aware to provide a copy of the vaccination record if obtained from local  pharmacy or Health Dept. Verbalized acceptance and understanding.  Flu Vaccine status: Due, Education has been provided regarding the importance  of this vaccine. Advised may receive this vaccine at local pharmacy or Health Dept. Aware to provide a copy of the vaccination record if obtained from local pharmacy or Health Dept. Verbalized acceptance and understanding.  Pneumococcal vaccine status: Due, Education has been provided regarding the importance of this vaccine. Advised may receive this vaccine at local pharmacy or Health Dept. Aware to provide a copy of the vaccination record if obtained from local pharmacy or Health Dept. Verbalized acceptance and understanding.  Covid-19 vaccine status: Information provided on how to obtain vaccines.   Qualifies for Shingles Vaccine? Yes   Zostavax completed No   Shingrix Completed?: No.    Education has been provided regarding the importance of this vaccine. Patient has been advised to call insurance company to determine out of pocket expense if they have not yet received this vaccine. Advised may also receive vaccine at local pharmacy or Health Dept. Verbalized acceptance and understanding.  Screening Tests Health Maintenance  Topic Date Due   FOOT EXAM  Never done   OPHTHALMOLOGY EXAM  Never done   URINE MICROALBUMIN  Never done   Hepatitis C Screening  Never done   TETANUS/TDAP  Never done   Zoster Vaccines- Shingrix (2 of 2) 03/21/2015   COVID-19 Vaccine (3 - Pfizer risk series) 01/13/2020   INFLUENZA VACCINE  03/27/2021   HEMOGLOBIN A1C  10/15/2021   PNA vac Low Risk Adult  Completed   HPV VACCINES  Aged Out    Health Maintenance  Health Maintenance Due  Topic Date Due   FOOT EXAM  Never done   OPHTHALMOLOGY EXAM  Never done   URINE MICROALBUMIN  Never done   Hepatitis C Screening  Never done   TETANUS/TDAP  Never done   Zoster Vaccines- Shingrix (2 of 2) 03/21/2015   COVID-19 Vaccine (3 - Pfizer risk series) 01/13/2020    INFLUENZA VACCINE  03/27/2021    Colorectal cancer screening: No longer required.   Lung Cancer Screening: (Low Dose CT Chest recommended if Age 43-80 years, 30 pack-year currently smoking OR have quit w/in 15years.) does not qualify.   Lung Cancer Screening Referral: No  Additional Screening:  Hepatitis C Screening: does qualify; Completed No, patient would like to have this done at next office visit.  Vision Screening: Recommended annual ophthalmology exams for early detection of glaucoma and other disorders of the eye. Is the patient up to date with their annual eye exam?  Yes  Who is the provider or what is the name of the office in which the patient attends annual eye exams? [facility] If pt is not established with a provider, would they like to be referred to a provider to establish care? No .   Dental Screening: Recommended annual dental exams for proper oral hygiene  Community Resource Referral / Chronic Care Management: CRR required this visit?  No   CCM required this visit?  No      Plan:     I have personally reviewed and noted the following in the patient's chart:   Medical and social history Use of alcohol, tobacco or illicit drugs  Current medications and supplements including opioid prescriptions. Patient is not currently taking opioid prescriptions. Functional ability and status Nutritional status Physical activity Advanced directives List of other physicians Hospitalizations, surgeries, and ER visits in previous 12 months Vitals Screenings to include cognitive, depression, and falls Referrals and appointments  In addition, I have reviewed and discussed with patient certain preventive protocols, quality metrics, and best  practice recommendations. A written personalized care plan for preventive services as well as general preventive health recommendations were provided to patient.    Mr. Fults , Thank you for taking time to come for your Medicare  Wellness Visit. I appreciate your ongoing commitment to your health goals. Please review the following plan we discussed and let me know if I can assist you in the future.   These are the goals we discussed:  Goals   None     This is a list of the screening recommended for you and due dates:  Health Maintenance  Topic Date Due   Complete foot exam   Never done   Eye exam for diabetics  Never done   Urine Protein Check  Never done   Hepatitis C Screening: USPSTF Recommendation to screen - Ages 5-79 yo.  Never done   Tetanus Vaccine  Never done   Zoster (Shingles) Vaccine (2 of 2) 03/21/2015   COVID-19 Vaccine (3 - Pfizer risk series) 01/13/2020   Flu Shot  03/27/2021   Hemoglobin A1C  10/15/2021   Pneumonia vaccines  Completed   HPV Vaccine  Aged [address] [place]   05/05/2021   Nurse Notes: Patient was informed he will need the following vaccinations, influenza, tdap, pneumonia, and second shingles vaccines. Patient plans on getting his flu and tdap vaccine at his next office visit on 05/31/2021. Also hepatitis C screening will be done at this visit as well. Mr. Harriet was informed on how to receive his pneumonia and shingles vaccine. Patient declined Advanced directive, patient states he has 2 daughters and they know his final wishes.

## 2021-05-05 NOTE — Patient Instructions (Signed)
  Julian Sparks , Thank you for taking time to come for your Medicare Wellness Visit. I appreciate your ongoing commitment to your health goals. Please review the following plan we discussed and let me know if I can assist you in the future.   These are the goals we discussed:  Goals   None     This is a list of the screening recommended for you and due dates:  Health Maintenance  Topic Date Due   Complete foot exam   Never done   Eye exam for diabetics  Never done   Urine Protein Check  Never done   Hepatitis C Screening: USPSTF Recommendation to screen - Ages 39-79 yo.  Never done   Tetanus Vaccine  Never done   Zoster (Shingles) Vaccine (2 of 2) 03/21/2015   COVID-19 Vaccine (3 - Pfizer risk series) 01/13/2020   Flu Shot  03/27/2021   Hemoglobin A1C  10/15/2021   Pneumonia vaccines  Completed   HPV Vaccine  Aged Out

## 2021-05-06 NOTE — Progress Notes (Signed)
I have reviewed this visit and agree with the documentation.   

## 2021-05-08 ENCOUNTER — Other Ambulatory Visit: Payer: Self-pay | Admitting: Internal Medicine

## 2021-05-09 ENCOUNTER — Encounter: Payer: Self-pay | Admitting: Pulmonary Disease

## 2021-05-09 ENCOUNTER — Other Ambulatory Visit: Payer: Self-pay

## 2021-05-09 ENCOUNTER — Ambulatory Visit: Payer: Medicare HMO | Admitting: Pulmonary Disease

## 2021-05-09 ENCOUNTER — Other Ambulatory Visit
Admission: RE | Admit: 2021-05-09 | Discharge: 2021-05-09 | Disposition: A | Discharge status: Home / Self Care | Payer: Medicare HMO | Source: Ambulatory Visit | Attending: Pulmonary Disease | Admitting: Pulmonary Disease

## 2021-05-09 VITALS — BP 136/80 | HR 79 | Temp 97.4°F | Ht 66.0 in | Wt 198.0 lb

## 2021-05-09 DIAGNOSIS — J9611 Chronic respiratory failure with hypoxia: Secondary | ICD-10-CM | POA: Diagnosis not present

## 2021-05-09 DIAGNOSIS — I2781 Cor pulmonale (chronic): Secondary | ICD-10-CM | POA: Diagnosis not present

## 2021-05-09 DIAGNOSIS — I5032 Chronic diastolic (congestive) heart failure: Secondary | ICD-10-CM | POA: Diagnosis not present

## 2021-05-09 DIAGNOSIS — J672 Bird fancier's lung: Secondary | ICD-10-CM | POA: Insufficient documentation

## 2021-05-09 DIAGNOSIS — J9612 Chronic respiratory failure with hypercapnia: Secondary | ICD-10-CM | POA: Diagnosis not present

## 2021-05-09 DIAGNOSIS — C8300 Small cell B-cell lymphoma, unspecified site: Secondary | ICD-10-CM

## 2021-05-09 DIAGNOSIS — J449 Chronic obstructive pulmonary disease, unspecified: Secondary | ICD-10-CM

## 2021-05-09 NOTE — Progress Notes (Deleted)
Subjective:    Patient ID: Julian Sparks, male    DOB: 05/17/1946, 75 y.o.   MRN: [id number] Chief Complaint  Patient presents with   pulmonary consult    Recent admission--breathing has improved since d/c. D/c on trilogy machine. C/o sob with exertion, prod cough with grey sputum and occ wheezing.   HPI The patient is a 75 year old former smoker (quit 2014, 106 PY) first diagnosed with COPD in 2010 and evaluated at [facility] in 2016 showing COPD class III at that time.  Patient presents for evaluation of COPD and chronic respiratory failure with hypercapnia and hypoxia due to the same.  He is kindly referred by Dr. [name].      DATA: 04/16/2014 PFT's [facility]): FEV1 1.02 L or 35% predicted.  FVC reported at 67.4% predicted.  Small airways component FEF 25-75% at 13.9%.  DLCO 59%.  These are the only numbers available on record. 01/24/2021 echocardiogram: LVEF 50 to 55%, hypokinesis of septal wall, grade 2 DD, RV function moderately reduced, RV size enlarged.  Right atrial size dilated.  No valvular abnormalities 04/06/2021 sleep study: Obstructive sleep apnea AHI 20.8, baseline oxygen saturation remained below 90% even with supplemental oxygen   Review of Systems A 10 point review of systems was performed and it is as noted above otherwise negative.  Past Medical History:  Diagnosis Date   CHF (congestive heart failure) (HCC)    Chronic bronchitis (HCC)    COPD (chronic obstructive pulmonary disease) (HCC)    Diabetes mellitus without complication (HCC)    Diastolic dysfunction    Dyspnea    with exertion   Hypertension    Past Surgical History:  Procedure Laterality Date   CIRCUMCISION     SHOULDER ARTHROSCOPY WITH OPEN ROTATOR CUFF REPAIR Left 06/20/2016   Procedure: SHOULDER ARTHROSCOPY WITH OPEN ROTATOR CUFF REPAIR;  Surgeon: Earnestine Leys, MD;  Location: [facility] ORS;  Service: Orthopedics;  Laterality: Left;   UPPER GI ENDOSCOPY     Family History  Problem  Relation Age of Onset   Hypertension Mother    Cancer Sister    Diabetes Brother    Social History   Tobacco Use   Smoking status: Former    Packs/day: 2.00    Years: 53.00    Pack years: 106.00    Types: Cigarettes    Quit date: 2014    Years since quitting: 8.8   Smokeless tobacco: Former    Types: Chew  Substance Use Topics   Alcohol use: No   Allergies  Allergen Reactions   Aspirin Nausea Only and Other (See Comments)    325 mg aspirin cause stomach upset   Penicillins Rash   Current Meds  Medication Sig   ACCU-CHEK AVIVA PLUS test strip TEST BLOOD SUGAR TWICE DAILY   Accu-Chek Softclix Lancets lancets Check blood sugar two times daily   albuterol (VENTOLIN HFA) 108 (90 Base) MCG/ACT inhaler Inhale 2 puffs into the lungs as needed for wheezing or shortness of breath.   Alcohol Swabs (B-D SINGLE USE SWABS REGULAR) PADS USE AS DIRECTED   aspirin 81 MG EC tablet Take 81 mg by mouth daily. Swallow whole.    atorvastatin (LIPITOR) 40 MG tablet TAKE 1 TABLET EVERY DAY   B Complex-C (SUPER B COMPLEX PO) Take 1 tablet by mouth daily.    benzonatate (TESSALON PERLES) 100 MG capsule Take 1 capsule (100 mg total) by mouth 3 (three) times daily as needed for cough.   Blood Glucose Monitoring  Suppl (ACCU-CHEK AVIVA PLUS) w/Device KIT Check blood sugar BID   budesonide-formoterol (SYMBICORT) 160-4.5 MCG/ACT inhaler Inhale 2 puffs into the lungs 2 (two) times daily.   fluticasone (FLONASE) 50 MCG/ACT nasal spray Place 1 spray into both nostrils 2 (two) times a day.    gabapentin (NEURONTIN) 300 MG capsule Take 1 capsule (300 mg total) by mouth 3 (three) times daily.   gentamicin cream (GARAMYCIN) 0.1 % Apply 1 application topically 2 (two) times daily.   metFORMIN (GLUCOPHAGE) 500 MG tablet TAKE 1 TABLET TWICE DAILY   metoprolol succinate (TOPROL-XL) 25 MG 24 hr tablet TAKE 1 TABLET(25 MG) BY MOUTH DAILY   Pseudoeph-Doxylamine-DM-APAP (NYQUIL PO) Take by mouth as needed.   traZODone  (DESYREL) 50 MG tablet TAKE 2 TABLETS EVERY DAY   furosemide (LASIX) 20 MG tablet TAKE 2 TABLETS EVERY DAY   [DISCONTINUED] guaiFENesin (ROBITUSSIN) 100 MG/5ML SOLN Take 5 mLs (100 mg total) by mouth every 4 (four) hours as needed for cough or to loosen phlegm. (Patient not taking: Reported on 05/31/2021)   ipratropium-albuterol (DUONEB) 0.5-2.5 (3) MG/3ML SOLN INHALE THE CONTENTS OF 1 VIAL VIA NEBULIZER EVERY 4 HOURS AS NEEDED   [DISCONTINUED] JARDIANCE 10 MG TABS tablet TAKE 1 TABLET(10 MG) BY MOUTH DAILY   predniSONE (DELTASONE) 10 MG tablet Take 10 mg by mouth daily with breakfast. (Patient not taking: Reported on 06/07/2021)   Immunization History  Administered Date(s) Administered   Fluad Quad(high Dose 65+) 05/11/2020, 05/31/2021   Influenza,inj,quad, With Preservative 06/27/2016   PFIZER(Purple Top)SARS-COV-2 Vaccination 11/23/2019, 12/16/2019   Pneumococcal Conjugate-13 04/16/2014   Pneumococcal-Unspecified 05/23/2014   Zoster Recombinat (Shingrix) 01/24/2015       Objective:   Physical Exam BP 136/80 (BP Location: Left Arm, Cuff Size: Normal)   Pulse 79   Temp (!) 97.4 F (36.3 C) (Temporal)   Ht 5' 6"  (1.676 m)   Wt 198 lb (89.8 kg)   SpO2 99%   BMI 31.96 kg/m  GENERAL: Overweight gentleman, presents on transport chair.  Comfortable with nasal cannula O2.  No conversational dyspnea. HEAD: Normocephalic, atraumatic.  EYES: Pupils equal, round, reactive to light.  No scleral icterus.  MOUTH: Nose/mouth/throat not examined due to masking requirements for COVID 19. NECK: Supple. No thyromegaly. Trachea midline. No JVD.  No adenopathy. PULMONARY: Good air entry bilaterally.  Coarse breath sounds throughout otherwise no adventitious sounds. CARDIOVASCULAR: S1 and S2. Regular rate and rhythm.  ABDOMEN: Protuberant otherwise benign. MUSCULOSKELETAL: No joint deformity, no clubbing, no edema.  NEUROLOGIC: No neurodeficits.  Gait not tested. SKIN: Intact,warm,dry.  Psoriatic  type nail changes on left hand. PSYCH: Mood and behavior normal.       Assessment & Plan:     ICD-10-CM   1. Stage 3 severe COPD by GOLD classification ([place])  J44.9    Cannot rule out now at GOAL stage IV Recent exacerbation Continue to taper steroids PFTs    2. Chronic respiratory failure with hypoxia and hypercapnia (HCC)  J96.11    J96.12    On supplemental at 2 to 4 L/min oxygen Today maintained adequate oxygenation on 2 L/min Uses Trilogy vent at night Compliance good    3. Chronic diastolic heart failure (HCC)  I50.32    Recent decompensation triggered COPD exacerbation Continue management per cardiology    4. Cor pulmonale, chronic (HCC)  I27.81    Recent echo of May 2022 confirms Due to severity of COPD Aggravated by diastolic dysfunction    5. 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  He has been however advised to let us know if he has any difficulties with reduced dose of prednisone.  He shows good compliance with his Trilogy ventilator and is encouraged to continue using it.  He presented initially  on 4 L/min saturating 99%.  He did well during his visit at 2 L/min saturating 94 to 95% with this.  Advised that he could do 2 L/min with rest and increase to 4 L/min with activity if needed.  The patient does own an African parrot and his recent CT scans of this showed evidence of groundglass opacities which could have been related to his volume overload or potential early ILD/hypersensitivity pneumonitis.  We will check for this possibility.  We will see the patient in follow-up in 6 to 8 weeks time he is to contact us prior to that time should any new difficulties arise.   Renold Don, MD Advanced Bronchoscopy PCCM [facility]    *This note was dictated using voice recognition software/Dragon.  Despite best efforts to proofread, errors can occur which can change the meaning.  Any change was purely unintentional.

## 2021-05-09 NOTE — Patient Instructions (Signed)
Cut the prednisone in half and start taking half a tablet every morning until they are all gone  Let us know if you have difficulties with the reduced dose of prednisone.  Continue using your Trilogy ventilator.  We have ordered some blood work to check for allergies.  You did well reducing the oxygen to 2 L/min.  We will see you in follow-up in 6 to 8 weeks time call sooner should any new problems arise.

## 2021-05-12 LAB — HYPERSENSITIVITY PNEUMONITIS
A. Pullulans Abs: NEGATIVE
A.Fumigatus #1 Abs: NEGATIVE
Micropolyspora faeni, IgG: NEGATIVE
Pigeon Serum Abs: NEGATIVE
Thermoact. Saccharii: NEGATIVE
Thermoactinomyces vulgaris, IgG: NEGATIVE

## 2021-05-17 ENCOUNTER — Ambulatory Visit (INDEPENDENT_AMBULATORY_CARE_PROVIDER_SITE_OTHER): Payer: Medicare HMO

## 2021-05-17 ENCOUNTER — Ambulatory Visit: Payer: Medicare HMO | Admitting: Family

## 2021-05-17 ENCOUNTER — Other Ambulatory Visit: Payer: Self-pay

## 2021-05-17 DIAGNOSIS — E08621 Diabetes mellitus due to underlying condition with foot ulcer: Secondary | ICD-10-CM | POA: Diagnosis not present

## 2021-05-17 DIAGNOSIS — I739 Peripheral vascular disease, unspecified: Secondary | ICD-10-CM

## 2021-05-17 DIAGNOSIS — L97522 Non-pressure chronic ulcer of other part of left foot with fat layer exposed: Secondary | ICD-10-CM | POA: Diagnosis not present

## 2021-05-18 ENCOUNTER — Ambulatory Visit: Payer: Medicare HMO | Admitting: Physician Assistant

## 2021-05-20 DIAGNOSIS — J449 Chronic obstructive pulmonary disease, unspecified: Secondary | ICD-10-CM | POA: Diagnosis not present

## 2021-05-20 DIAGNOSIS — J9610 Chronic respiratory failure, unspecified whether with hypoxia or hypercapnia: Secondary | ICD-10-CM | POA: Diagnosis not present

## 2021-05-21 DIAGNOSIS — J449 Chronic obstructive pulmonary disease, unspecified: Secondary | ICD-10-CM | POA: Diagnosis not present

## 2021-05-21 DIAGNOSIS — J9610 Chronic respiratory failure, unspecified whether with hypoxia or hypercapnia: Secondary | ICD-10-CM | POA: Diagnosis not present

## 2021-05-22 DIAGNOSIS — J9610 Chronic respiratory failure, unspecified whether with hypoxia or hypercapnia: Secondary | ICD-10-CM | POA: Diagnosis not present

## 2021-05-22 DIAGNOSIS — J449 Chronic obstructive pulmonary disease, unspecified: Secondary | ICD-10-CM | POA: Diagnosis not present

## 2021-05-27 ENCOUNTER — Other Ambulatory Visit: Payer: Self-pay | Admitting: Internal Medicine

## 2021-05-31 ENCOUNTER — Ambulatory Visit (INDEPENDENT_AMBULATORY_CARE_PROVIDER_SITE_OTHER): Payer: Medicare HMO | Admitting: Internal Medicine

## 2021-05-31 ENCOUNTER — Other Ambulatory Visit: Payer: Self-pay

## 2021-05-31 ENCOUNTER — Encounter: Payer: Self-pay | Admitting: Internal Medicine

## 2021-05-31 VITALS — BP 169/84 | HR 84 | Ht 66.0 in | Wt 202.0 lb

## 2021-05-31 DIAGNOSIS — E785 Hyperlipidemia, unspecified: Secondary | ICD-10-CM | POA: Diagnosis not present

## 2021-05-31 DIAGNOSIS — E669 Obesity, unspecified: Secondary | ICD-10-CM

## 2021-05-31 DIAGNOSIS — I471 Supraventricular tachycardia: Secondary | ICD-10-CM

## 2021-05-31 DIAGNOSIS — D500 Iron deficiency anemia secondary to blood loss (chronic): Secondary | ICD-10-CM | POA: Diagnosis not present

## 2021-05-31 DIAGNOSIS — Z23 Encounter for immunization: Secondary | ICD-10-CM

## 2021-05-31 DIAGNOSIS — E1169 Type 2 diabetes mellitus with other specified complication: Secondary | ICD-10-CM | POA: Diagnosis not present

## 2021-05-31 DIAGNOSIS — G4733 Obstructive sleep apnea (adult) (pediatric): Secondary | ICD-10-CM

## 2021-05-31 DIAGNOSIS — J9611 Chronic respiratory failure with hypoxia: Secondary | ICD-10-CM | POA: Diagnosis not present

## 2021-05-31 DIAGNOSIS — I10 Essential (primary) hypertension: Secondary | ICD-10-CM | POA: Diagnosis not present

## 2021-05-31 LAB — GLUCOSE, POCT (MANUAL RESULT ENTRY): POC Glucose: 241 mg/dl — AB (ref 70–99)

## 2021-05-31 NOTE — Assessment & Plan Note (Signed)

## 2021-05-31 NOTE — Assessment & Plan Note (Signed)
Patient is using CPAP to sleep at night

## 2021-05-31 NOTE — Progress Notes (Signed)
Established Patient Office Visit  Subjective:  Patient ID: Julian Sparks, male    DOB: 02/04/1946  Age: 75 y.o. MRN: [id number]  CC:  Chief Complaint  Patient presents with   Diabetes    1 month follow up    Diabetes Associated symptoms include fatigue. Pertinent negatives for diabetes include no chest pain.   Julian Sparks presents for check up  Past Medical History:  Diagnosis Date   CHF (congestive heart failure) ([place])    Chronic bronchitis (HCC)    COPD (chronic obstructive pulmonary disease) ([place])    Diabetes mellitus without complication (HCC)    Diastolic dysfunction    Dyspnea    with exertion   Hypertension     Past Surgical History:  Procedure Laterality Date   CIRCUMCISION     SHOULDER ARTHROSCOPY WITH OPEN ROTATOR CUFF REPAIR Left 06/20/2016   Procedure: SHOULDER ARTHROSCOPY WITH OPEN ROTATOR CUFF REPAIR;  Surgeon: Earnestine Leys, MD;  Location: [facility] ORS;  Service: Orthopedics;  Laterality: Left;   UPPER GI ENDOSCOPY      Family History  Problem Relation Age of Onset   Hypertension Mother    Cancer Sister    Diabetes Brother     Social History   Socioeconomic History   Marital status: Widowed    Spouse name: Not on file   Number of children: 2   Years of education: 9   Highest education level: Some college, no degree  Occupational History   Not on file  Tobacco Use   Smoking status: Former    Packs/day: 2.00    Years: 53.00    Pack years: 106.00    Types: Cigarettes    Quit date: 2014    Years since quitting: 8.7   Smokeless tobacco: Former    Types: Nurse, children's Use: Never used  Substance and Sexual Activity   Alcohol use: No   Drug use: No   Sexual activity: Not Currently  Other Topics Concern   Not on file  Social History Narrative   Not on file   Social Determinants of Health   Financial Resource Strain: Low Risk    Difficulty of Paying Living Expenses: Not hard at all  Food Insecurity: No Food Insecurity    Worried About Charity fundraiser in the Last Year: Never true   Arboriculturist in the Last Year: Never true  Transportation Needs: No Transportation Needs   Lack of Transportation (Medical): No   Lack of Transportation (Non-Medical): No  Physical Activity: Sufficiently Active   Days of Exercise per Week: 7 days   Minutes of Exercise per Session: 40 min  Stress: No Stress Concern Present   Feeling of Stress : Not at all  Social Connections: Socially Isolated   Frequency of Communication with Friends and Family: More than three times a week   Frequency of Social Gatherings with Friends and Family: More than three times a week   Attends Religious Services: Never   Marine scientist or Organizations: No   Attends Archivist Meetings: Never   Marital Status: Widowed  Human resources officer Violence: Not At Risk   Fear of Current or Ex-Partner: No   Emotionally Abused: No   Physically Abused: No   Sexually Abused: No     Current Outpatient Medications:    ACCU-CHEK AVIVA PLUS test strip, TEST BLOOD SUGAR TWICE DAILY, Disp: 300 strip, Rfl: 3   Accu-Chek Softclix Lancets lancets,  Check blood sugar two times daily, Disp: 300 each, Rfl: 3   albuterol (VENTOLIN HFA) 108 (90 Base) MCG/ACT inhaler, Inhale 2 puffs into the lungs as needed for wheezing or shortness of breath., Disp: 18 g, Rfl: 3   Alcohol Swabs (B-D SINGLE USE SWABS REGULAR) PADS, USE AS DIRECTED, Disp: 300 each, Rfl: 3   aspirin 81 MG EC tablet, Take 81 mg by mouth daily. Swallow whole. , Disp: , Rfl:    atorvastatin (LIPITOR) 40 MG tablet, TAKE 1 TABLET EVERY DAY, Disp: 90 tablet, Rfl: 3   B Complex-C (SUPER B COMPLEX PO), Take 1 tablet by mouth daily. , Disp: , Rfl:    benzonatate (TESSALON PERLES) 100 MG capsule, Take 1 capsule (100 mg total) by mouth 3 (three) times daily as needed for cough., Disp: 90 capsule, Rfl: 1   Blood Glucose Monitoring Suppl (ACCU-CHEK AVIVA PLUS) w/Device KIT, Check blood sugar BID, Disp:  1 kit, Rfl: 3   budesonide-formoterol (SYMBICORT) 160-4.5 MCG/ACT inhaler, Inhale 2 puffs into the lungs 2 (two) times daily., Disp: 3 each, Rfl: 3   fluticasone (FLONASE) 50 MCG/ACT nasal spray, Place 1 spray into both nostrils 2 (two) times a day. , Disp: , Rfl:    furosemide (LASIX) 20 MG tablet, TAKE 2 TABLETS EVERY DAY, Disp: 180 tablet, Rfl: 6   gabapentin (NEURONTIN) 300 MG capsule, Take 1 capsule (300 mg total) by mouth 3 (three) times daily., Disp: 90 capsule, Rfl: 0   gentamicin cream (GARAMYCIN) 0.1 %, Apply 1 application topically 2 (two) times daily., Disp: 30 g, Rfl: 1   ipratropium-albuterol (DUONEB) 0.5-2.5 (3) MG/3ML SOLN, INHALE THE CONTENTS OF 1 VIAL VIA NEBULIZER EVERY 4 HOURS AS NEEDED, Disp: 90 mL, Rfl: 3   JARDIANCE 10 MG TABS tablet, TAKE 1 TABLET(10 MG) BY MOUTH DAILY, Disp: 30 tablet, Rfl: 0   metFORMIN (GLUCOPHAGE) 500 MG tablet, TAKE 1 TABLET TWICE DAILY, Disp: 180 tablet, Rfl: 3   metoprolol succinate (TOPROL-XL) 25 MG 24 hr tablet, TAKE 1 TABLET(25 MG) BY MOUTH DAILY, Disp: 90 tablet, Rfl: 3   predniSONE (DELTASONE) 10 MG tablet, Take 10 mg by mouth daily with breakfast., Disp: , Rfl:    Pseudoeph-Doxylamine-DM-APAP (NYQUIL PO), Take by mouth as needed., Disp: , Rfl:    traZODone (DESYREL) 50 MG tablet, TAKE 2 TABLETS EVERY DAY, Disp: 180 tablet, Rfl: 3   Allergies  Allergen Reactions   Aspirin Nausea Only and Other (See Comments)    325 mg aspirin cause stomach upset   Penicillins Rash    ROS Review of Systems  Constitutional:  Positive for fatigue. Negative for diaphoresis.  HENT:  Negative for congestion and hearing loss.   Eyes:  Negative for discharge.  Respiratory:  Positive for shortness of breath. Negative for cough, chest tightness and wheezing.   Cardiovascular:  Negative for chest pain, palpitations and leg swelling.     Objective:    Physical Exam Cardiovascular:     Rate and Rhythm: Tachycardia present.     Pulses: Normal pulses.   Pulmonary:     Breath sounds: No wheezing, rhonchi or rales.    BP (!) 169/84   Pulse 84   Ht _0  (1.676 m)   Wt 202 lb (91.6 kg)   SpO2 96% Comment: Pt is on 4L today  BMI 32.60 kg/m  Wt Readings from Last 3 Encounters:  05/31/21 202 lb (91.6 kg)  05/09/21 198 lb (89.8 kg)  05/03/21 197 lb 1.6 oz (89.4 kg)  Health Maintenance Due  Topic Date Due   FOOT EXAM  Never done   OPHTHALMOLOGY EXAM  Never done   URINE MICROALBUMIN  Never done   Hepatitis C Screening  Never done   TETANUS/TDAP  Never done   Zoster Vaccines- Shingrix (2 of 2) 03/21/2015   COVID-19 Vaccine (3 - Pfizer risk series) 01/13/2020   INFLUENZA VACCINE  03/27/2021    There are no preventive care reminders to display for this patient.  Lab Results  Component Value Date   TSH 1.152 01/24/2021   Lab Results  Component Value Date   WBC 8.5 04/17/2021   HGB 9.8 (L) 04/17/2021   HCT 35.0 (L) 04/17/2021   MCV 93.8 04/17/2021   PLT 233 04/17/2021   Lab Results  Component Value Date   NA 139 04/18/2021   K 4.2 04/18/2021   CO2 40 (H) 04/18/2021   GLUCOSE 138 (H) 04/18/2021   BUN 33 (H) 04/18/2021   CREATININE 0.85 04/20/2021   BILITOT 1.0 04/13/2021   ALKPHOS 74 04/13/2021   AST 18 04/13/2021   ALT 16 04/13/2021   PROT 7.3 04/13/2021   ALBUMIN 3.8 04/13/2021   CALCIUM 8.4 (L) 04/18/2021   ANIONGAP 9 04/18/2021   Lab Results  Component Value Date   CHOL 101 01/26/2021   Lab Results  Component Value Date   HDL 46 01/26/2021   Lab Results  Component Value Date   LDLCALC 36 01/26/2021   Lab Results  Component Value Date   TRIG 97 01/26/2021   Lab Results  Component Value Date   CHOLHDL 2.2 01/26/2021   Lab Results  Component Value Date   HGBA1C 6.8 (H) 04/14/2021      Assessment & Plan:   Problem List Items Addressed This Visit       Cardiovascular and Mediastinum   Essential hypertension    Blood pressure was rechecked and was found to be 130/80      Paroxysmal  atrial tachycardia ([place])    Stable at the present time        Respiratory   Respiratory failure ([place])    Patient was advised to continue using 2 L of oxygen per minute 24 hours a day      OSA (obstructive sleep apnea)    Patient is using CPAP to sleep at night        Endocrine   Type 2 diabetes mellitus with hyperlipidemia ([place])    - The patient's blood sugar is labile on med. - The patient will continue the current treatment regimen.  - I encouraged the patient to regularly check blood sugar.  - I encouraged the patient to monitor diet. I encouraged the patient to eat low-carb and low-sugar to help prevent blood sugar spikes.  - I encouraged the patient to continue following their prescribed treatment plan for diabetes - I informed the patient to get help if blood sugar drops below 20m/dL, or if suddenly have trouble thinking clearly or breathing.  Patient was advised to buy a book on diabetes from a local bookstore or from [place]  Patient should read 2 chapters every day to keep the motivation going, this is in addition to some of the materials we provided them from the office.  There are other resources on the Internet like YouTube and wilkipedia to get an education on the diabetes        Other   Obesity (BMI 30-39.9)    Patient was advised low-carb diet to keep his  weight under control      Other Visit Diagnoses     Type 2 diabetes mellitus with other specified complication, unspecified whether long term insulin use ([place])    -  Primary   Relevant Orders   POCT glucose (manual entry) (Completed)     Patient is anemic will refer to GI  No orders of the defined types were placed in this encounter.   Follow-up: No follow-ups on file.    [name], MD

## 2021-05-31 NOTE — Assessment & Plan Note (Signed)
Blood pressure was rechecked and was found to be 130/80

## 2021-05-31 NOTE — Assessment & Plan Note (Signed)
Stable at the present time. 

## 2021-05-31 NOTE — Assessment & Plan Note (Signed)
Patient was advised low-carb diet to keep his weight under control

## 2021-05-31 NOTE — Assessment & Plan Note (Signed)
Patient was advised to continue using 2 L of oxygen per minute 24 hours a day

## 2021-06-05 LAB — BLOOD GAS, ARTERIAL
Acid-Base Excess: 11.2 mmol/L — ABNORMAL HIGH (ref 0.0–2.0)
Acid-Base Excess: 10.1 mmol/L — ABNORMAL HIGH (ref 0.0–2.0)
Bicarbonate: 39.8 mmol/L — ABNORMAL HIGH (ref 20.0–28.0)
Bicarbonate: 38 mmol/L — ABNORMAL HIGH (ref 20.0–28.0)
Expiratory PAP: 6
FIO2: 0.4
Inspiratory PAP: 18
Mechanical Rate: 12
Mode: POSITIVE
O2 Saturation: 91.5 %
O2 Saturation: 92.5 %
Patient temperature: 37
Patient temperature: 37
pCO2 arterial: 79 mmHg (ref 32.0–48.0)
pCO2 arterial: 72 mmHg (ref 32.0–48.0)
pH, Arterial: 7.31 — ABNORMAL LOW (ref 7.350–7.450)
pH, Arterial: 7.33 — ABNORMAL LOW (ref 7.350–7.450)
pO2, Arterial: 68 mmHg — ABNORMAL LOW (ref 83.0–108.0)
pO2, Arterial: 70 mmHg — ABNORMAL LOW (ref 83.0–108.0)

## 2021-06-06 NOTE — Progress Notes (Deleted)
Patient ID: Julian Sparks, male    DOB: April 19, 1946, 75 y.o.   MRN: [id number]  HPI  [name] is a 75 y/o male with a history of DM, HTN, COPD, previous tobacco use and chronic heart failure.   Echo report from 01/24/21 reviewed and showed an EF of 50-55% with severe RAE but without LVH.   Admitted 04/13/21 due to worsening shortness of breath due to COPD exacerbation and bilateral pneumonia. Started using NIV/trilogy. Antibiotic treatment finished and prednisone taper given. Diuresed 14L with IV lasix with transition to oral diuretics. AMS improved after CO2 retention corrected. Discharged after 7 days. Admitted 01/24/21 due to acute heart failure. Placed on bipap due to worsening SOB. Given steroids due to COPD. Cardiology, wound and med onc consults obtained. Aggressively diuresed. Antibiotics given for strep in his sputum. Eventually transitioned to nasal cannula oxygen. Modified barium swallow was negative. PT/OT evaluation done. SNF recommended but he refused. Discharged after 21 days.   He presents today for a follow-up visit with a chief complaint of   Past Medical History:  Diagnosis Date   CHF (congestive heart failure) ([place])    Chronic bronchitis ([place])    COPD (chronic obstructive pulmonary disease) ([place])    Diabetes mellitus without complication (HCC)    Diastolic dysfunction    Dyspnea    with exertion   Hypertension    Past Surgical History:  Procedure Laterality Date   CIRCUMCISION     SHOULDER ARTHROSCOPY WITH OPEN ROTATOR CUFF REPAIR Left 06/20/2016   Procedure: SHOULDER ARTHROSCOPY WITH OPEN ROTATOR CUFF REPAIR;  Surgeon: Earnestine Leys, MD;  Location: [facility] ORS;  Service: Orthopedics;  Laterality: Left;   UPPER GI ENDOSCOPY     Family History  Problem Relation Age of Onset   Hypertension Mother    Cancer Sister    Diabetes Brother    Social History   Tobacco Use   Smoking status: Former    Packs/day: 2.00    Years: 53.00    Pack years: 106.00    Types:  Cigarettes    Quit date: 2014    Years since quitting: 8.7   Smokeless tobacco: Former    Types: Chew  Substance Use Topics   Alcohol use: No   Allergies  Allergen Reactions   Aspirin Nausea Only and Other (See Comments)    325 mg aspirin cause stomach upset   Penicillins Rash     Review of Systems  Constitutional:  Positive for fatigue. Negative for appetite change.  HENT:  Negative for congestion, postnasal drip and sore throat.   Eyes: Negative.   Respiratory:  Positive for cough (productive of green sputum), shortness of breath (improving) and wheezing.   Cardiovascular:  Negative for chest pain, palpitations and leg swelling.  Gastrointestinal:  Negative for abdominal distention and abdominal pain.  Endocrine: Negative.   Genitourinary: Negative.   Musculoskeletal:  Positive for arthralgias (left foot). Negative for back pain and neck pain.  Skin:  Positive for wound (bottom of left foot).  Allergic/Immunologic: Negative.   Neurological:  Negative for dizziness and light-headedness.  Hematological:  Negative for adenopathy. Bruises/bleeds easily.  Psychiatric/Behavioral:  Negative for dysphoric mood and sleep disturbance (sleeping on 2 pillows; oxygen at 4L around the clock; using NIV trilogy). The patient is not nervous/anxious.      Physical Exam Vitals and nursing note reviewed. Exam conducted with a chaperone present (daughter & granddaughter).  Constitutional:      Appearance: Normal appearance.  HENT:  Head: Normocephalic and atraumatic.  Cardiovascular:     Rate and Rhythm: Regular rhythm. Tachycardia present.  Pulmonary:     Effort: Pulmonary effort is normal. No respiratory distress.     Breath sounds: No wheezing or rales.  Abdominal:     General: There is no distension.     Palpations: Abdomen is soft.     Tenderness: There is no abdominal tenderness.  Musculoskeletal:        General: No tenderness.     Cervical back: Normal range of motion and  neck supple.     Right lower leg: Edema (trace pitting) present.     Left lower leg: Edema (trace pitting) present.  Skin:    General: Skin is warm and dry.     Findings: Ecchymosis (right side of face) present.  Neurological:     General: No focal deficit present.     Mental Status: He is alert and oriented to person, place, and time.  Psychiatric:        Mood and Affect: Mood normal.        Behavior: Behavior normal.        Thought Content: Thought content normal.   Assessment & Plan:  1: Chronic heart failure with preserved ejection fraction without LVH/LAE- - NYHA class III (although improved) - euvolemic today - weighing daily; reminded to call for an overnight weight gain of > 2 pounds or a weekly weight gain of > 5 pounds - weight 199 pounds from last visit here 5 weeks ago - not adding salt to his food and his daughter rinses any canned foods out; explained that he needed to keep his daily sodium intake to 2000mg / day - saw cardiology Rockey Situ) 03/21/21; returns 05/18/21 - encouraged to elevate his legs when sitting for long periods of time; edema markedly improved - BNP 02/12/21 was 75.4  2: HTN- - BP  - saw PCP (Masoud) 05/31/21 - BMP 04/18/21 reviewed and showed sodium 139, potassium 4.2, creatinine 0.91 and GFR >60  3: DM- - A1c 01/24/21 was 6.8% - says glucose was   4: COPD- - wears oxgyen at 4L around the clock, continuous now (not pulse) - using NIV trilogy at bedtime as well as during the day and feels like that has made the biggest difference - saw pulmonology Patsey Berthold) 05/09/21; returns 06/28/21    Patient did not bring his medications nor a list. Each medication was verbally reviewed with the patient and he was encouraged to bring the bottles to every visit to confirm accuracy of list.

## 2021-06-06 NOTE — Progress Notes (Signed)
Cardiology Office Note  Date:  06/07/2021   ID:  Julian Sparks, DOB 04-02-1946, MRN [id number]  PCP:  [name], MD   Chief Complaint  Patient presents with   [facility] follow up     Patient c/o shortness of breath. Medications reviewed by the patient verbally.     HPI:  [name] is a 75 year old gentleman with history of  morbid obesity,  COPD, on home oxygen 2 L,  diabetes type 2,  hypertension,  presenting to the hospital June 2022 with worsening shortness of breath, hypoxia, leg swelling concerning for CHF Who presents for follow-up of his diastolic CHF  Last seen in clinic July 2022 Seen by CHF clinic 1 month ago  Prior notes reviewed, in the hospital June 2022 with Severe ACUTE Hypoxic and Hypercapnic Respiratory Failure due to pneumonia, acute severe decompensated combined systolic and diastolic heart failure on BiPAP Streptococcus Agalactiae pneumonia. Hemoptysis Lymphoma.  CT chest Substantial increase in bulky celiac and retroperitoneal adenopathy compared to the 03/06/2019 PET-CT.  Appearance compatible with limb for proliferative process.  On last clinic visit was having falls, weak, was using a cane Family monitoring his weight Weight at home was 200 pounds, weight in the office 204 Had minimal swelling  Back in hospital 03/2021 daughter checked his pulse oximetry on his 4 L of oxygen and his pulse oximetry was between 75 and 85%. Patient went to the heart failure clinic and was also noted to be hypoxic with pulse oximetry in the 70s on his 4 L of oxygen  [place] at d/c 197 pounds,   On lasix 40 daily at d/c Weight today at home 199 pounds On prednisone leaving hospital, now complete  Does nebulizers 4 times a day, very congested on today's visit  EKG personally reviewed by myself on todays visit Shows normal sinus rhythm rate 77 bpm old inferior MI   Echocardiogram Jan 24, 2021  1. Left ventricular ejection fraction, by estimation, is 50 to  55%. The  left ventricle has low normal function. Hypokinesis of septal wall. Left  ventricular diastolic parameters are consistent with Grade II diastolic  dysfunction (pseudonormalization).   2. Right ventricular systolic function is moderately reduced. The right  ventricular size is severely enlarged. Tricuspid regurgitation signal is  inadequate for assessing PA pressure.   3. Right atrial size was mildly dilated.     PMH:   has a past medical history of CHF (congestive heart failure) (HCC), Chronic bronchitis ([place]), COPD (chronic obstructive pulmonary disease) ([place]), Diabetes mellitus without complication ([place]), Diastolic dysfunction, Dyspnea, and Hypertension.  PSH:    Past Surgical History:  Procedure Laterality Date   CIRCUMCISION     SHOULDER ARTHROSCOPY WITH OPEN ROTATOR CUFF REPAIR Left 06/20/2016   Procedure: SHOULDER ARTHROSCOPY WITH OPEN ROTATOR CUFF REPAIR;  Surgeon: Earnestine Leys, MD;  Location: [facility] ORS;  Service: Orthopedics;  Laterality: Left;   UPPER GI ENDOSCOPY      Current Outpatient Medications  Medication Sig Dispense Refill   ACCU-CHEK AVIVA PLUS test strip TEST BLOOD SUGAR TWICE DAILY 300 strip 3   Accu-Chek Softclix Lancets lancets Check blood sugar two times daily 300 each 3   albuterol (VENTOLIN HFA) 108 (90 Base) MCG/ACT inhaler Inhale 2 puffs into the lungs as needed for wheezing or shortness of breath. 18 g 3   Alcohol Swabs (B-D SINGLE USE SWABS REGULAR) PADS USE AS DIRECTED 300 each 3   aspirin 81 MG EC tablet Take 81 mg by mouth daily. Swallow  whole.      atorvastatin (LIPITOR) 40 MG tablet TAKE 1 TABLET EVERY DAY 90 tablet 3   B Complex-C (SUPER B COMPLEX PO) Take 1 tablet by mouth daily.      benzonatate (TESSALON PERLES) 100 MG capsule Take 1 capsule (100 mg total) by mouth 3 (three) times daily as needed for cough. 90 capsule 1   Blood Glucose Monitoring Suppl (ACCU-CHEK AVIVA PLUS) w/Device KIT Check blood sugar BID 1 kit 3    budesonide-formoterol (SYMBICORT) 160-4.5 MCG/ACT inhaler Inhale 2 puffs into the lungs 2 (two) times daily. 3 each 3   fluticasone (FLONASE) 50 MCG/ACT nasal spray Place 1 spray into both nostrils 2 (two) times a day.      furosemide (LASIX) 20 MG tablet TAKE 2 TABLETS EVERY DAY 180 tablet 6   gabapentin (NEURONTIN) 300 MG capsule Take 1 capsule (300 mg total) by mouth 3 (three) times daily. 90 capsule 0   gentamicin cream (GARAMYCIN) 0.1 % Apply 1 application topically 2 (two) times daily. 30 g 1   ipratropium-albuterol (DUONEB) 0.5-2.5 (3) MG/3ML SOLN INHALE THE CONTENTS OF 1 VIAL VIA NEBULIZER EVERY 4 HOURS AS NEEDED 90 mL 3   JARDIANCE 10 MG TABS tablet TAKE 1 TABLET(10 MG) BY MOUTH DAILY 30 tablet 0   metFORMIN (GLUCOPHAGE) 500 MG tablet TAKE 1 TABLET TWICE DAILY 180 tablet 3   metoprolol succinate (TOPROL-XL) 25 MG 24 hr tablet TAKE 1 TABLET(25 MG) BY MOUTH DAILY 90 tablet 3   Pseudoeph-Doxylamine-DM-APAP (NYQUIL PO) Take by mouth as needed.     traZODone (DESYREL) 50 MG tablet TAKE 2 TABLETS EVERY DAY 180 tablet 3   No current facility-administered medications for this visit.     Allergies:   Aspirin and Penicillins   Social History:  The patient  reports that he quit smoking about 8 years ago. His smoking use included cigarettes. He has a 106.00 pack-year smoking history. He has quit using smokeless tobacco.  His smokeless tobacco use included chew. He reports that he does not drink alcohol and does not use drugs.   Family History:   family history includes Cancer in his sister; Diabetes in his brother; Hypertension in his mother.    Review of Systems: Review of Systems  Constitutional: Negative.   HENT: Negative.    Respiratory:  Positive for shortness of breath.   Cardiovascular:  Positive for leg swelling.  Gastrointestinal: Negative.   Musculoskeletal: Negative.   Neurological: Negative.   Psychiatric/Behavioral: Negative.    All other systems reviewed and are  negative.   PHYSICAL EXAM: VS:  BP 140/70 (BP Location: Left Arm, Patient Position: Sitting, Cuff Size: Normal)   Pulse 77   Ht _0  (1.676 m)   Wt 203 lb (92.1 kg)   SpO2 95% Comment: 2 Liters of oxygen  BMI 32.77 kg/m  , BMI Body mass index is 32.77 kg/m. Constitutional:  oriented to person, place, and time. No distress.  HENT:  Head: Grossly normal Eyes:  no discharge. No scleral icterus.  Neck: No JVD, no carotid bruits  Cardiovascular: Regular rate and rhythm, no murmurs appreciated Pulmonary/Chest: Decreased breath sounds bilaterally, rales Abdominal: Soft.  no distension.  no tenderness.  Musculoskeletal: Normal range of motion Neurological:  normal muscle tone. Coordination normal. No atrophy Skin: Skin warm and dry Psychiatric: normal affect, pleasant  Recent Labs: 01/24/2021: TSH 1.152 02/14/2021: Magnesium 2.3 04/13/2021: ALT 16; B Natriuretic Peptide 277.7 04/17/2021: Hemoglobin 9.8; Platelets 233 04/18/2021: BUN 33; Potassium 4.2; Sodium 139  04/20/2021: Creatinine, Ser 0.85    Lipid Panel Lab Results  Component Value Date   CHOL 101 01/26/2021   HDL 46 01/26/2021   LDLCALC 36 01/26/2021   TRIG 97 01/26/2021      Wt Readings from Last 3 Encounters:  06/07/21 203 lb (92.1 kg)  05/31/21 202 lb (91.6 kg)  05/09/21 198 lb (89.8 kg)      ASSESSMENT AND PLAN:  Problem List Items Addressed This Visit       Cardiology Problems   Essential hypertension   Other Visit Diagnoses     Chronic diastolic congestive heart failure ([place])    -  Primary   Relevant Orders   EKG 12-Lead   Type 2 diabetes mellitus without complication, without long-term current use of insulin (HCC)       Chronic obstructive pulmonary disease, unspecified COPD type ([place])       Relevant Orders   EKG 12-Lead   Obstructive sleep apnea syndrome       Relevant Orders   EKG 12-Lead     Chronic diastolic CHF Predominant issue is his severe COPD, there is little air movement Stressed  importance of using his nebulizers, did not do so today Sliding scale provided for his diuretic We can adjust this and get more aggressive if needed For weight 197 or less :  lasix 40 daily For weight 198 and higher: lasix 40 twice a day For weight 201 or higher, take metolazone 5 mg x1 (then 30 minutes later take lasix 40 mg x 1) Stable renal function Continue Jardiance, metoprolol succinate  COPD exacerbation 2 recent hospitalizations for hypoxic respiratory failure Aggressive treatment with BiPAP, antibiotics, steroids Using trilogy machine at home Off prednisone, very tight lungs today, significant chest congestion  Recent prolonged hospitalization reviewed with him in detail  Total encounter time more than 25 minutes  Greater than 50% was spent in counseling and coordination of care with the patient    Signed, Esmond Plants, M.D., Ph.D. [place], [place]

## 2021-06-07 ENCOUNTER — Other Ambulatory Visit: Payer: Self-pay | Admitting: *Deleted

## 2021-06-07 ENCOUNTER — Other Ambulatory Visit: Payer: Self-pay

## 2021-06-07 ENCOUNTER — Encounter: Payer: Self-pay | Admitting: Cardiovascular Disease

## 2021-06-07 ENCOUNTER — Ambulatory Visit (INDEPENDENT_AMBULATORY_CARE_PROVIDER_SITE_OTHER): Payer: Medicare HMO | Admitting: Cardiovascular Disease

## 2021-06-07 VITALS — BP 140/70 | HR 77 | Ht 66.0 in | Wt 203.0 lb

## 2021-06-07 DIAGNOSIS — J449 Chronic obstructive pulmonary disease, unspecified: Secondary | ICD-10-CM | POA: Diagnosis not present

## 2021-06-07 DIAGNOSIS — R609 Edema, unspecified: Secondary | ICD-10-CM | POA: Diagnosis not present

## 2021-06-07 DIAGNOSIS — G4733 Obstructive sleep apnea (adult) (pediatric): Secondary | ICD-10-CM

## 2021-06-07 DIAGNOSIS — I5032 Chronic diastolic (congestive) heart failure: Secondary | ICD-10-CM

## 2021-06-07 DIAGNOSIS — I10 Essential (primary) hypertension: Secondary | ICD-10-CM

## 2021-06-07 DIAGNOSIS — E119 Type 2 diabetes mellitus without complications: Secondary | ICD-10-CM | POA: Diagnosis not present

## 2021-06-07 MED ORDER — METOLAZONE 5 MG PO TABS: ORAL_TABLET | ORAL | 0 refills | Status: AC

## 2021-06-07 MED ORDER — IPRATROPIUM-ALBUTEROL 0.5-2.5 (3) MG/3ML IN SOLN: RESPIRATORY_TRACT | 3 refills | Status: DC

## 2021-06-07 MED ORDER — FUROSEMIDE 20 MG PO TABS
ORAL_TABLET | ORAL | 3 refills | Status: DC
Start: 2021-06-07 — End: 2021-12-25

## 2021-06-07 MED ORDER — METOLAZONE 5 MG PO TABS: ORAL_TABLET | ORAL | 1 refills | Status: DC

## 2021-06-07 NOTE — Patient Instructions (Addendum)
Medication Instructions:   Sliding scale for Lasix For weight 197 or less  lasix 40 daily For weight 198 and higher lasix 40 twice a day For weight 201 or higher,  take metolazone 5 mg (once)  then 30 minutes later take lasix 40 mg (once)  If you need a refill on your cardiac medications before your next appointment, please call your pharmacy.   Lab work: No new labs needed  Testing/Procedures: No new testing needed  Follow-Up: At [facility], you and your health needs are our priority.  As part of our continuing mission to provide you with exceptional heart care, we have created designated Provider Care Teams.  These Care Teams include your primary Cardiologist (physician) and Advanced Practice Providers (APPs -  Physician Assistants and Nurse Practitioners) who all work together to provide you with the care you need, when you need it.  You will need a follow up appointment in 3 months  Providers on your designated Care Team:   Murray Hodgkins, NP Christell Faith, PA-C Marrianne Mood, PA-C Cadence [place], [place]  COVID-19 Vaccine Information can be found at: [email or web address].uk For questions related to vaccine distribution or appointments, please email [email or web address] or call [phone].

## 2021-06-08 ENCOUNTER — Ambulatory Visit: Payer: Medicare HMO | Admitting: Family

## 2021-06-12 ENCOUNTER — Other Ambulatory Visit: Payer: Self-pay

## 2021-06-13 ENCOUNTER — Other Ambulatory Visit: Payer: Self-pay

## 2021-06-16 ENCOUNTER — Other Ambulatory Visit: Payer: Self-pay

## 2021-06-16 ENCOUNTER — Ambulatory Visit: Payer: Medicare HMO | Admitting: Podiatry

## 2021-06-16 DIAGNOSIS — I739 Peripheral vascular disease, unspecified: Secondary | ICD-10-CM | POA: Diagnosis not present

## 2021-06-16 DIAGNOSIS — E08621 Diabetes mellitus due to underlying condition with foot ulcer: Secondary | ICD-10-CM | POA: Diagnosis not present

## 2021-06-16 DIAGNOSIS — L97522 Non-pressure chronic ulcer of other part of left foot with fat layer exposed: Secondary | ICD-10-CM

## 2021-06-16 NOTE — Progress Notes (Signed)
   Subjective:  75 y.o. male with PMHx of diabetes mellitus presenting for follow-up evaluation of an ulcer to the plantar aspect of the fifth MTPJ of the left foot which has been present for about 4-5 months.  He has been applying the antibiotic cream and he has continued to notice some improvement.  He does have some nocturnal pain when he is sleeping.    Past Medical History:  Diagnosis Date   CHF (congestive heart failure) (HCC)    Chronic bronchitis (HCC)    COPD (chronic obstructive pulmonary disease) (HCC)    Diabetes mellitus without complication (HCC)    Diastolic dysfunction    Dyspnea    with exertion   Hypertension      Objective/Physical Exam General: The patient is alert and oriented x3 in no acute distress.  Dermatology:  Wound #1 noted to the plantar fifth MTPJ has healed.  Complete reepithelialization has occurred.  There is some hyperkeratotic callus tissue around the area  Vascular: ABI w/wo TBI BILATERAL 05/17/2021: Right: Resting right ankle-brachial index is within normal range. No  evidence of significant right lower extremity arterial disease. The right  toe-brachial index is normal.   Left: Resting left ankle-brachial index indicates mild left lower  extremity arterial disease. The left toe-brachial index is abnormal.   Neurological: Epicritic and protective threshold diminished bilaterally.   Musculoskeletal Exam: Range of motion within normal limits to all pedal and ankle joints bilateral. Muscle strength 5/5 in all groups bilateral.   Radiographic exam taken 04/07/2021: Currently there is no cortical erosion or concern for osteomyelitis.  Degenerative changes noted throughout the foot.  No fractures identified.  Assessment: 1.  Ulcer subfifth MTPJ left secondary to diabetes mellitus 2. diabetes mellitus w/ peripheral neuropathy   Plan of Care:  1. Patient was evaluated. 2.  Continue management with vascular.  At the moment vascular is going to  observe for any changes.  The lower extremity arterial disease to the left lower extremity appears to be stable with no acute changes 3.  The wound to the plantar aspect of the fifth MTP joint has healed.  Light debridement was performed and recommend antibiotic cream for an additional week with a light dressing 4.  Patient may transition back into the regular shoes 5.  Return to clinic in 3 months for routine foot care   Edrick Kins, DPM [facility]  Dr. Edrick Kins, DPM    [address], [place] [address]                Office [phone]  Fax [phone]

## 2021-06-19 DIAGNOSIS — J9610 Chronic respiratory failure, unspecified whether with hypoxia or hypercapnia: Secondary | ICD-10-CM | POA: Diagnosis not present

## 2021-06-19 DIAGNOSIS — J449 Chronic obstructive pulmonary disease, unspecified: Secondary | ICD-10-CM | POA: Diagnosis not present

## 2021-06-20 DIAGNOSIS — J449 Chronic obstructive pulmonary disease, unspecified: Secondary | ICD-10-CM | POA: Diagnosis not present

## 2021-06-20 DIAGNOSIS — J9610 Chronic respiratory failure, unspecified whether with hypoxia or hypercapnia: Secondary | ICD-10-CM | POA: Diagnosis not present

## 2021-06-21 ENCOUNTER — Telehealth: Payer: Self-pay | Admitting: Pulmonary Disease

## 2021-06-21 ENCOUNTER — Other Ambulatory Visit: Payer: Self-pay | Admitting: Internal Medicine

## 2021-06-21 DIAGNOSIS — J449 Chronic obstructive pulmonary disease, unspecified: Secondary | ICD-10-CM | POA: Diagnosis not present

## 2021-06-21 DIAGNOSIS — J9610 Chronic respiratory failure, unspecified whether with hypoxia or hypercapnia: Secondary | ICD-10-CM | POA: Diagnosis not present

## 2021-06-21 MED ORDER — AZITHROMYCIN 250 MG PO TABS
ORAL_TABLET | ORAL | 0 refills | Status: AC
Start: 2021-06-21 — End: 2021-06-26

## 2021-06-21 MED ORDER — PREDNISONE 10 MG (21) PO TBPK: ORAL_TABLET | ORAL | 0 refills | Status: DC

## 2021-06-21 NOTE — Telephone Encounter (Signed)
Spoke to patient's daughter, cindy(DPR). [name] stated that patient was instructed to taper off of prednisone. He stopped prednisone 2 weeks ago.  She is concerned that that patient should continue prednisone due to sob.  Patient has had to increased liter flow from 2L to 4L. Spo2 86% on 2L. Recovered to mid 90's on 4L.  Patient is experiencing increased sob with exertion and prod cough with greenish sputum.  Denies f/c/s or additional sx.  He is taking Symbicort BID and duoneb QID. No recent covid test.  Two covid vaccines.   Dr. Patsey Berthold, please advise. Thanks

## 2021-06-21 NOTE — Telephone Encounter (Signed)
He was supposed to let us know if he did not tolerate coming off prednisone.  Lets send in a prednisone taper and azithromycin Z-Pak.  We need to get him in with me or an APP as soon as possible.  He also has issues with congestive heart failure and was given instructions on increasing his Lasix according to weight.  He needs to follow through with that.  If he worsens he needs to come to the ED.  I also recommend that for completeness, he do at least home COVID test.  He was recently negative at the hospital.

## 2021-06-21 NOTE — Telephone Encounter (Signed)
Patient's daughter, Cindy(dpr) is aware of recommendations and voiced her understanding.  Zpak and prednisone has been sent to preferred pharmacy.  Pending OV 06/28/2021. Nothing further needed at this time.

## 2021-06-22 ENCOUNTER — Encounter: Payer: Self-pay | Admitting: Pulmonary Disease

## 2021-06-22 NOTE — Progress Notes (Signed)
Subjective:    Patient ID: Julian Sparks, male    DOB: 12-Oct-1945, 75 y.o.   MRN: [id number] Chief Complaint  Patient presents with   pulmonary consult    Recent admission--breathing has improved since d/c. D/c on trilogy machine. C/o sob with exertion, prod cough with grey sputum and occ wheezing.   HPI The patient is a 75 year old former smoker (quit 2014, 106 PY) first diagnosed with COPD in 2010 and evaluated at [facility] in 2016 showing COPD class III at that time.  Patient presents for evaluation of COPD and chronic respiratory failure with hypercapnia and hypoxia due to the same.  He is kindly referred by Dr. [name].  The patient was admitted to [facility] between t 13 April 2021 through 20 April 2021.  He was admitted with acute on chronic respiratory failure with hypoxia and hypercapnia.  He also had acute congestive heart failure at that time.  This triggered a COPD exacerbation.  He is chronically on 2 to 4 L of oxygen and uses a Trilogy ventilator at night.  Review his compliance through advanced home care and he has excellent compliance with the unit.  The patient diuresed about 14 L during that admission and after that improved dramatically.  The patient notes that since his discharge he has been doing well.  He is currently on on 2 L/min O2 at rest and 4 L with exertion.  His only concern is that he does not know how much further he needs to taper the prednisone.  He was told to discuss this with [place] today.  The patient states that he has had shortness of breath for several years evaluation at [facility] in 2015 showed that he was stage III COPD as per data noted below.  He notes that inhalers help his shortness of breath.  He also notes that fluid buildup and overexertion aggravate his symptoms.  Most recently his symptoms were improved by diuresis.  He has not had any orthopnea or paroxysmal nocturnal dyspnea of late.  Some mild lower extremity edema which is relieved by elevating his legs  and by diuresis.  Cough occasional productive of greenish sputum but for the most part nonproductive and rare.   Patient does not have any military history.  He has resided in [place] all of his life.  He has not had any recent travel outside of the country, he used to work Architect.  He has 106 pack years history of smoking.  He has a Programmer, systems and a pet bird (African gray parrot).   DATA: 04/16/2014 PFT's [name]): FEV1 1.02 L or 35% predicted.  FVC reported at 67.4% predicted.  Small airways component FEF 25-75% at 13.9%.  DLCO 59%.  These are the only numbers available on record. 01/24/2021 echocardiogram: LVEF 50 to 55%, hypokinesis of septal wall, grade 2 DD, RV function moderately reduced, RV size enlarged.  Right atrial size dilated.  No valvular abnormalities 04/06/2021 sleep study: Obstructive sleep apnea AHI 20.8, baseline oxygen saturation remained below 90% even with supplemental oxygen   Review of Systems A 10 point review of systems was performed and it is as noted above otherwise negative.  Past Medical History:  Diagnosis Date   CHF (congestive heart failure) (HCC)    Chronic bronchitis (HCC)    COPD (chronic obstructive pulmonary disease) (HCC)    Diabetes mellitus without complication (HCC)    Diastolic dysfunction    Dyspnea    with exertion   Hypertension  Past Surgical History:  Procedure Laterality Date   CIRCUMCISION     SHOULDER ARTHROSCOPY WITH OPEN ROTATOR CUFF REPAIR Left 06/20/2016   Procedure: SHOULDER ARTHROSCOPY WITH OPEN ROTATOR CUFF REPAIR;  Surgeon: Earnestine Leys, MD;  Location: [facility] ORS;  Service: Orthopedics;  Laterality: Left;   UPPER GI ENDOSCOPY     Family History  Problem Relation Age of Onset   Hypertension Mother    Cancer Sister    Diabetes Brother    Social History   Tobacco Use   Smoking status: Former    Packs/day: 2.00    Years: 53.00    Pack years: 106.00    Types: Cigarettes    Quit date: 2014    Years since  quitting: 8.8   Smokeless tobacco: Former    Types: Chew  Substance Use Topics   Alcohol use: No   Allergies  Allergen Reactions   Aspirin Nausea Only and Other (See Comments)    325 mg aspirin cause stomach upset   Penicillins Rash   Current Meds  Medication Sig   ACCU-CHEK AVIVA PLUS test strip TEST BLOOD SUGAR TWICE DAILY   Accu-Chek Softclix Lancets lancets Check blood sugar two times daily   albuterol (VENTOLIN HFA) 108 (90 Base) MCG/ACT inhaler Inhale 2 puffs into the lungs as needed for wheezing or shortness of breath.   Alcohol Swabs (B-D SINGLE USE SWABS REGULAR) PADS USE AS DIRECTED   aspirin 81 MG EC tablet Take 81 mg by mouth daily. Swallow whole.    atorvastatin (LIPITOR) 40 MG tablet TAKE 1 TABLET EVERY DAY   B Complex-C (SUPER B COMPLEX PO) Take 1 tablet by mouth daily.    benzonatate (TESSALON PERLES) 100 MG capsule Take 1 capsule (100 mg total) by mouth 3 (three) times daily as needed for cough.   Blood Glucose Monitoring Suppl (ACCU-CHEK AVIVA PLUS) w/Device KIT Check blood sugar BID   budesonide-formoterol (SYMBICORT) 160-4.5 MCG/ACT inhaler Inhale 2 puffs into the lungs 2 (two) times daily.   fluticasone (FLONASE) 50 MCG/ACT nasal spray Place 1 spray into both nostrils 2 (two) times a day.    gabapentin (NEURONTIN) 300 MG capsule Take 1 capsule (300 mg total) by mouth 3 (three) times daily.   gentamicin cream (GARAMYCIN) 0.1 % Apply 1 application topically 2 (two) times daily.   metFORMIN (GLUCOPHAGE) 500 MG tablet TAKE 1 TABLET TWICE DAILY   metoprolol succinate (TOPROL-XL) 25 MG 24 hr tablet TAKE 1 TABLET(25 MG) BY MOUTH DAILY   Pseudoeph-Doxylamine-DM-APAP (NYQUIL PO) Take by mouth as needed.   traZODone (DESYREL) 50 MG tablet TAKE 2 TABLETS EVERY DAY   furosemide (LASIX) 20 MG tablet TAKE 2 TABLETS EVERY DAY   [DISCONTINUED] guaiFENesin (ROBITUSSIN) 100 MG/5ML SOLN Take 5 mLs (100 mg total) by mouth every 4 (four) hours as needed for cough or to loosen phlegm.  (Patient not taking: Reported on 05/31/2021)   ipratropium-albuterol (DUONEB) 0.5-2.5 (3) MG/3ML SOLN INHALE THE CONTENTS OF 1 VIAL VIA NEBULIZER EVERY 4 HOURS AS NEEDED   [DISCONTINUED] JARDIANCE 10 MG TABS tablet TAKE 1 TABLET(10 MG) BY MOUTH DAILY   predniSONE (DELTASONE) 10 MG tablet Take 10 mg by mouth daily with breakfast. (Patient not taking: Reported on 06/07/2021)      Objective:   Physical Exam BP 136/80 (BP Location: Left Arm, Cuff Size: Normal)   Pulse 79   Temp (!) 97.4 F (36.3 C) (Temporal)   Ht 5' 6"  (1.676 m)   Wt 198 lb (89.8 kg)   SpO2  99%   BMI 31.96 kg/m  GENERAL: Overweight gentleman, no acute distress.  Presents in transport chair.  Comfortable on 4 l/min nasal cannula O2 saturating 99%, on 2 L/min saturating 94% HEAD: Normocephalic, atraumatic.  EYES: Pupils equal, round, reactive to light.  No scleral icterus.  MOUTH: Nose/mouth/throat not examined due to masking requirements for COVID 19. NECK: Supple. No thyromegaly. Trachea midline. No JVD.  No adenopathy. PULMONARY: Good air entry bilaterally.  Coarse breath sounds throughout otherwise no adventitious sounds. CARDIOVASCULAR: S1 and S2. Regular rate and rhythm.  No rubs, murmurs or gallops heard.  ABDOMEN: Protuberant, soft, benign. MUSCULOSKELETAL: No joint deformity, no clubbing, no edema.  NEUROLOGIC: No overt focal deficit. SKIN: Intact,warm,dry.  Psoriatic changes on the nails on left hand. PSYCH: Mood and behavior normal    Assessment & Plan:     ICD-10-CM   1. Stage 3 severe COPD by GOLD classification ([place])  J44.9    Cannot rule out now at GOAL stage IV Recent exacerbation Continue to taper steroids PFTs    2. Chronic respiratory failure with hypoxia and hypercapnia (HCC)  J96.11    J96.12    On supplemental at 2 to 4 L/min oxygen Today maintained adequate oxygenation on 2 L/min Uses Trilogy vent at night Compliance good    3. Chronic diastolic heart failure (HCC)  I50.32    Recent  decompensation triggered COPD exacerbation Continue management per cardiology    4. Cor pulmonale, chronic (HCC)  I27.81    Recent echo of May 2022 confirms Due to severity of COPD Aggravated by diastolic dysfunction    5. Bird fancier's lung ([place]) - Rule out  J67.2 Chlamydophila psittaci Antibodies (IgG, IgA, IgM)    AMB REFERRAL FOR DME    Hypersensitivity Pneumonitis   Has African gray parrot in the home Check for Chlamydia psittaci antibodies Check hypersensitivity pneumonitis    6. 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  He has been however advised to let us know if he has any difficulties with reduced dose of prednisone.  He shows good compliance with his Trilogy ventilator and is encouraged to continue using it.  He presented initially on 4 L/min saturating 99%.  He did well during his visit at 2 L/min saturating 94 to 95% with this.  Advised that he could do 2 L/min with rest and increase to 4 L/min with activity if needed.  The patient does own an African parrot and his recent CT scans of this showed evidence of groundglass opacities which could have been related to his volume overload or potential early ILD/hypersensitivity pneumonitis.  We will check for this possibility.  We will see the patient in follow-up in 6 to 8 weeks time he is to contact us  prior to that time should any new  difficulties arise.     Renold Don, MD Advanced Bronchoscopy PCCM [facility]    *This note was dictated using voice recognition software/Dragon.  Despite best efforts to proofread, errors can occur which can change the meaning.  Any change was purely unintentional.

## 2021-06-26 ENCOUNTER — Other Ambulatory Visit: Payer: Self-pay | Admitting: *Deleted

## 2021-06-28 ENCOUNTER — Other Ambulatory Visit: Payer: Self-pay

## 2021-06-28 ENCOUNTER — Other Ambulatory Visit
Admission: RE | Admit: 2021-06-28 | Discharge: 2021-06-28 | Disposition: A | Discharge status: Home / Self Care | Payer: Medicare HMO | Source: Ambulatory Visit | Attending: Pulmonary Disease | Admitting: Pulmonary Disease

## 2021-06-28 ENCOUNTER — Encounter: Payer: Self-pay | Admitting: Pulmonary Disease

## 2021-06-28 ENCOUNTER — Ambulatory Visit: Payer: Medicare HMO | Admitting: Pulmonary Disease

## 2021-06-28 VITALS — BP 126/80 | HR 86 | Temp 97.8°F | Ht 66.0 in | Wt 200.2 lb

## 2021-06-28 DIAGNOSIS — J449 Chronic obstructive pulmonary disease, unspecified: Secondary | ICD-10-CM

## 2021-06-28 DIAGNOSIS — W6129XA Other contact with other psittacines, initial encounter: Secondary | ICD-10-CM

## 2021-06-28 DIAGNOSIS — J9612 Chronic respiratory failure with hypercapnia: Secondary | ICD-10-CM | POA: Diagnosis not present

## 2021-06-28 DIAGNOSIS — J672 Bird fancier's lung: Secondary | ICD-10-CM | POA: Diagnosis not present

## 2021-06-28 DIAGNOSIS — J9611 Chronic respiratory failure with hypoxia: Secondary | ICD-10-CM | POA: Diagnosis not present

## 2021-06-28 LAB — CBC WITH DIFFERENTIAL/PLATELET
Abs Immature Granulocytes: 0.34 10*3/uL — ABNORMAL HIGH (ref 0.00–0.07)
Basophils Absolute: 0.1 10*3/uL (ref 0.0–0.1)
Basophils Relative: 0 %
Eosinophils Absolute: 0.3 10*3/uL (ref 0.0–0.5)
Eosinophils Relative: 2 %
HCT: 43.1 % (ref 39.0–52.0)
Hemoglobin: 13.9 g/dL (ref 13.0–17.0)
Immature Granulocytes: 3 %
Lymphocytes Relative: 33 %
Lymphs Abs: 4.3 10*3/uL — ABNORMAL HIGH (ref 0.7–4.0)
MCH: 29.2 pg (ref 26.0–34.0)
MCHC: 32.3 g/dL (ref 30.0–36.0)
MCV: 90.5 fL (ref 80.0–100.0)
Monocytes Absolute: 0.8 10*3/uL (ref 0.1–1.0)
Monocytes Relative: 6 %
Neutro Abs: 7.3 10*3/uL (ref 1.7–7.7)
Neutrophils Relative %: 56 %
Platelets: 211 10*3/uL (ref 150–400)
RBC: 4.76 MIL/uL (ref 4.22–5.81)
RDW: 17.5 % — ABNORMAL HIGH (ref 11.5–15.5)
WBC: 13.1 10*3/uL — ABNORMAL HIGH (ref 4.0–10.5)
nRBC: 0 % (ref 0.0–0.2)

## 2021-06-28 MED ORDER — BREZTRI AEROSPHERE 160-9-4.8 MCG/ACT IN AERO: 2.0000 | INHALATION_SPRAY | Freq: Two times a day (BID) | RESPIRATORY_TRACT | 0 refills | Status: DC

## 2021-06-28 NOTE — Progress Notes (Signed)
Subjective:    Patient ID: Julian Sparks, male    DOB: June 10, 1946, 75 y.o.   MRN: [id number] Chief Complaint  Patient presents with   Follow-up    Sob with exertion and prod cough with yellow sputum. 2L cont.    Chief Complaint  Patient presents with   Follow-up    Sob with exertion and prod cough with yellow sputum. 2L cont.     HPI The patient is a 75 year old former smoker (quit 2014, 106 PY) first diagnosed with COPD in 2010 and evaluated at [facility] in 2016 showing COPD class III at that time.  Patient presents for follow-up of his initial evaluation on 09 May 2021, he presents with 2 of his daughters.This is a scheduled visit.The patient was admitted to [facility] between 13 April 2021 through 20 April 2021.  He was admitted with acute on chronic respiratory failure with hypoxia and hypercapnia.  He also had acute congestive heart failure at that time.  This triggered a COPD exacerbation.  He is chronically on 2 to 4 L of oxygen and uses a Trilogy ventilator at night. He is currently on on 2 L/min O2 at rest and 4 L with exertion.  He is maintained on Symbicort 160/4.5, 2 inhalations twice a day and DuoNebs 4 times a day.  He does note that occasionally he misses a dose of DuoNeb.  And around 26 October he developed issues with creasing shortness of breath requiring to wear 4 L of oxygen at all times due to saturations being 86% on 2 L.  He also was noted to be volume overloaded according to his morning weights.  His daughter states that she increase his Lasix and he diuresed about "9 pounds" of fluid.  This made him feel better.  We had also called him a prednisone taper pack and Azithromycin.  He has improved back to baseline on all of these interventions.  Today he presents feeling well.  He finished his prednisone taper a few days ago.  His prior lab results for hypersensitivity pneumonitis (bird fanciers) was negative.  He does have an African gray parrot at home.  We had ordered  Chamidophila psittaci antibody panel on his prior visit however this was never done.  He is concerned he may flareup again while off of steroids.  He has noted that this is a trend with his flareups.  He also notes that volume overload will trigger a flareup.  This was clearly shown on his August admission.  Since his recent exacerbation he has not had any fevers, chills or sweats.  He voices no other complaint.  He is back to using 2 L of oxygen at all times and 4 L of oxygen with exertion.   DATA: 04/16/2014 PFT's [facility]): FEV1 1.02 L or 35% predicted.  FVC reported at 67.4% predicted.  Small airways component FEF 25-75% at 13.9%.  DLCO 59%.  These are the only numbers available on record. 01/24/2021 echocardiogram: LVEF 50 to 55%, hypokinesis of septal wall, grade 2 DD, RV function moderately reduced, RV size enlarged.  Right atrial size dilated.  No valvular abnormalities 04/06/2021 sleep study: Obstructive sleep apnea AHI 20.8, baseline oxygen saturation remained below 90% even with supplemental oxygen 05/09/2021 hypersensitivity pneumonitis (bird fanciers) panel: Normal   Review of Systems A 10 point review of systems was performed and it is as noted above otherwise negative.   Current Meds  Medication Sig   ACCU-CHEK AVIVA PLUS test strip TEST BLOOD SUGAR  TWICE DAILY   Accu-Chek Softclix Lancets lancets Check blood sugar two times daily   albuterol (VENTOLIN HFA) 108 (90 Base) MCG/ACT inhaler Inhale 2 puffs into the lungs as needed for wheezing or shortness of breath.   Alcohol Swabs (B-D SINGLE USE SWABS REGULAR) PADS USE AS DIRECTED   aspirin 81 MG EC tablet Take 81 mg by mouth daily. Swallow whole.    atorvastatin (LIPITOR) 40 MG tablet TAKE 1 TABLET EVERY DAY   B Complex-C (SUPER B COMPLEX PO) Take 1 tablet by mouth daily.    benzonatate (TESSALON PERLES) 100 MG capsule Take 1 capsule (100 mg total) by mouth 3 (three) times daily as needed for cough.   Blood Glucose Monitoring Suppl  (ACCU-CHEK AVIVA PLUS) w/Device KIT Check blood sugar BID   budesonide-formoterol (SYMBICORT) 160-4.5 MCG/ACT inhaler Inhale 2 puffs into the lungs 2 (two) times daily.   fluticasone (FLONASE) 50 MCG/ACT nasal spray Place 1 spray into both nostrils 2 (two) times a day.    furosemide (LASIX) 20 MG tablet For weight 197 or less take lasix 40 daily. For weight 198 and higher take lasix 40 twice a day. For weight 201 or higher take metolazone 5 mg then 30 minutes later take lasix 40 mg (once)   gabapentin (NEURONTIN) 300 MG capsule Take 1 capsule (300 mg total) by mouth 3 (three) times daily.   gentamicin cream (GARAMYCIN) 0.1 % Apply 1 application topically 2 (two) times daily.   ipratropium-albuterol (DUONEB) 0.5-2.5 (3) MG/3ML SOLN INHALE THE CONTENTS OF 1 VIAL VIA NEBULIZER EVERY 4 HOURS AS NEEDED   JARDIANCE 10 MG TABS tablet TAKE 1 TABLET(10 MG) BY MOUTH DAILY   metFORMIN (GLUCOPHAGE) 500 MG tablet TAKE 1 TABLET TWICE DAILY   metolazone (ZAROXOLYN) 5 MG tablet For weight 201 or higher take metolazone 5 mg, then 30 minutes later take lasix 40 mg   metoprolol succinate (TOPROL-XL) 25 MG 24 hr tablet TAKE 1 TABLET(25 MG) BY MOUTH DAILY   Pseudoeph-Doxylamine-DM-APAP (NYQUIL PO) Take by mouth as needed.   traZODone (DESYREL) 50 MG tablet TAKE 2 TABLETS EVERY DAY   [DISCONTINUED] predniSONE (STERAPRED UNI-PAK 21 TAB) 10 MG (21) TBPK tablet Use as directed       Objective:   Physical Exam BP 126/80 (BP Location: Left Arm, Cuff Size: Normal)   Pulse 86   Temp 97.8 F (36.6 C) (Temporal)   Ht 5' 6"  (1.676 m)   Wt 200 lb 3.2 oz (90.8 kg)   SpO2 95%   BMI 32.31 kg/m  GENERAL: Overweight gentleman, no acute distress.  Presents in transport chair.  Comfortable on 2 l/min nasal cannula O2 saturating 95%.   HEAD: Normocephalic, atraumatic.  EYES: Pupils equal, round, reactive to light.  No scleral icterus.  MOUTH: Nose/mouth/throat not examined due to masking requirements for COVID 19. NECK:  Supple. No thyromegaly. Trachea midline. No JVD.  No adenopathy. PULMONARY: Good air entry bilaterally.  Coarse breath sounds throughout otherwise no adventitious sounds. CARDIOVASCULAR: S1 and S2. Regular rate and rhythm.  No rubs, murmurs or gallops heard.  ABDOMEN: Protuberant, soft, benign. MUSCULOSKELETAL: No joint deformity, no clubbing, no edema.  NEUROLOGIC: No overt focal deficit. SKIN: Intact,warm,dry.  Psoriatic changes on the nails on left hand. PSYCH: Mood and behavior normal    Assessment & Plan:     ICD-10-CM   1. Stage 3 severe COPD by GOLD classification ([place])  J44.9 Pulmonary Function Test [facility] Only    Allergen Panel (27) + IGE  CBC w/Diff    Chlamydophila psittaci Antibodies (IgG, IgA, IgM)    CANCELED: IgE    CANCELED: Chlamydophila psittaci Antibodies (IgG, IgA, IgM)   PFTs Trial of Breztri Stop Symbicort Use DuoNeb only PRN Change DuoNeb to albuterol if remains on Breztri We will keep on prednisone 5 mg daily for now    2. Chronic respiratory failure with hypoxia and hypercapnia (HCC)  J96.11    J96.12    Continue Trilogy vent at night Continue oxygen at current liter flow     3. Other contact with other psittacines, initial encounter  W61.29XA Chlamydophila psittaci Antibodies (IgG, IgA, IgM)    CANCELED: Chlamydophila psittaci Antibodies (IgG, IgA, IgM)   Parrot in the home Check for potential psittacosis    4. Bird fancier's lung ([place]) - Rule out  J67.2    Hypersensitivity pneumonitis profile negative Check allergen panel     Orders Placed This Encounter  Procedures   Allergen Panel (27) + IGE    Standing Status:   Future    Number of Occurrences:   1    Standing Expiration Date:   06/28/2022   CBC w/Diff    Standing Status:   Future    Number of Occurrences:   1    Standing Expiration Date:   06/28/2022   Chlamydophila psittaci Antibodies (IgG, IgA, IgM)    Standing Status:   Future    Number of Occurrences:   1    Standing Expiration  Date:   06/28/2022   Pulmonary Function Test [facility] Only    Standing Status:   Future    Standing Expiration Date:   06/28/2022    Scheduling Instructions:     20mo   Order Specific Question:   Full PFT: includes the following: basic spirometry, spirometry pre & post bronchodilator, diffusion capacity (DLCO), lung volumes    Answer:   Full PFT   Meds ordered this encounter  Medications   Budeson-Glycopyrrol-Formoterol (BREZTRI AEROSPHERE) 160-9-4.8 MCG/ACT AERO    Sig: Inhale 2 puffs into the lungs in the morning and at bedtime.    Dispense:  5.9 g    Refill:  0    Order Specific Question:   Lot Number?    Answer:   [id number]    Order Specific Question:   Expiration Date?    Answer:   10/26/2023    Order Specific Question:   Manufacturer?    Answer:   [employer] [[id number]]    Order Specific Question:   Quantity    Answer:   2   We will see the patient in follow-up in 2 months time he is to contact [place] to that time should any new difficulties arise.   CRenold Don MD Advanced Bronchoscopy PCCM [facility]    *This note was dictated using voice recognition software/Dragon.  Despite best efforts to proofread, errors can occur which can change the meaning.  Any change was purely unintentional.

## 2021-06-28 NOTE — Patient Instructions (Addendum)
We will keep you on prednisone at 5 mg daily for now.  Please get your blood work done today since you have been off of prednisone.  We are giving you a trial of an inhaler called Breztri, this is basically Symbicort with an extra ingredient.  Does know how you do with this so we can call the inhaler in for you.  DO NOT TAKE SYMBICORT WHILE ON THE BREZTRI.  Use your nebulizer machine only as needed while on the [place].  If the Judithann Sauger does well for you I will have to change the medication you use on the nebulizer machine.  Use oxygen at 2 L/min with rest and 4 L/min with activity.  We are going to get breathing tests.  Continue using your Trilogy machine.  We will see you in follow-up in 2 months time call sooner should any new problems arise.

## 2021-06-29 ENCOUNTER — Telehealth: Payer: Self-pay | Admitting: Pulmonary Disease

## 2021-06-29 MED ORDER — PREDNISONE 5 MG PO TABS: 5.0000 mg | ORAL_TABLET | Freq: Every day | ORAL | 1 refills | Status: DC

## 2021-06-29 NOTE — Telephone Encounter (Signed)
Prednisone 5mg  has been sent to preferred pharmacy.  Patient's daughter, Cindy(DPR) is aware and voiced her understanding.  Nothing further needed.

## 2021-07-05 LAB — ALLERGEN PANEL (27) + IGE
Alternaria Alternata IgE: 1.03 kU/L — AB
Aspergillus Fumigatus IgE: <0.1 kU/L
Bahia Grass IgE: <0.1 kU/L
Bermuda Grass IgE: <0.1 kU/L
Cat Dander IgE: <0.1 kU/L
Cedar, Mountain IgE: 0.18 kU/L — AB
Cladosporium Herbarum IgE: 0.91 kU/L — AB
Cockroach, American IgE: <0.1 kU/L
Common Silver Birch IgE: <0.1 kU/L
D Farinae IgE: 1.34 kU/L — AB
D Pteronyssinus IgE: 1.37 kU/L — AB
Dog Dander IgE: <0.1 kU/L
Elm, American IgE: 0.29 kU/L — AB
IgE (Immunoglobulin E), Serum: 507 [IU]/mL — ABNORMAL HIGH (ref 6–495)
Johnson Grass IgE: <0.1 kU/L
Kentucky Bluegrass IgE: <0.1 kU/L
Maple/Box Elder IgE: 0.29 kU/L — AB
Mucor Racemosus IgE: 0.53 kU/L — AB
Oak, White IgE: <0.1 kU/L
Penicillium Chrysogen IgE: 0.22 kU/L — AB
Pigweed, Rough IgE: 0.16 kU/L — AB
Plantain, English IgE: 0.1 kU/L — AB
Ragweed, Short IgE: 0.19 kU/L — AB
Setomelanomma Rostrat: 0.46 kU/L — AB
Timothy Grass IgE: <0.1 kU/L
White Mulberry IgE: 0.16 kU/L — AB

## 2021-07-11 ENCOUNTER — Telehealth: Payer: Self-pay | Admitting: Pulmonary Disease

## 2021-07-12 NOTE — Telephone Encounter (Signed)
Noted  

## 2021-07-12 NOTE — Telephone Encounter (Signed)
Julian Pita, MD  07/07/2021  4:00 PM EST     Allergen panel is consistent with multiple allergies he will likely benefit from [place] therapy for the asthma component of his COPD.  We should start enrolling him in [place] program.    Patient's daughter, Cindy(DPR) is aware of above results and voiced her understanding.  She would like to discuss this further at 08/31/2021. Nothing further needed at this time.   Routing to Dr. Patsey Berthold as an [name].

## 2021-07-12 NOTE — Telephone Encounter (Signed)
Glad to discuss then.  But I do feel that Dupixent will help him not be dependent on prednisone.

## 2021-07-17 ENCOUNTER — Telehealth: Payer: Self-pay | Admitting: Pulmonary Disease

## 2021-07-17 MED ORDER — PREDNISONE 10 MG (21) PO TBPK: ORAL_TABLET | ORAL | 0 refills | Status: DC

## 2021-07-17 MED ORDER — AZITHROMYCIN 250 MG PO TABS: ORAL_TABLET | ORAL | 0 refills | Status: AC

## 2021-07-17 NOTE — Telephone Encounter (Signed)
ERROR

## 2021-07-17 NOTE — Telephone Encounter (Signed)
Lm for patient's granddaughter, Julian Sparks. Julian Sparks is not listed on DPR.   Spoke to patient. Patient feels that he has developed a cold.  C/o prod cough with green sputum, increased sob with exertion, wheezing and chills x1d.  Denied fever, sweats or additional sx.  2 covid vaccines and flu shot.  No recent covid or flu test. On 2-4L. Spo2 is maintaining around 91-94%.  He is using duoneb QID and ventolin PRN.  He completed samples of Breztri. He felt that inhaler was effective. He does not want to proceed with Rx, as he in the donut hole. He has a Rx of Symbicort.  Dr. Patsey Berthold, please advise. thanks

## 2021-07-17 NOTE — Telephone Encounter (Signed)
I believe that his symptoms are triggered by allergic reactions.  This then in turn causes him to have an exacerbation.  This is the reason why I want him to be started on Dupixent.  For now lets send in a prednisone taper pack and Azithromycin Z-Pak.  If symptoms worsen he needs to go to urgent care or emergency room.

## 2021-07-17 NOTE — Telephone Encounter (Signed)
Patient is aware of below recommendations and voiced his understanding.  Patient stated that he will discuss Dupixent at next visit.  Rx for zpak and prednisone has been sent to preferred pharmacy.  Nothing further needed at this time.

## 2021-07-25 ENCOUNTER — Telehealth: Payer: Self-pay

## 2021-07-25 NOTE — Telephone Encounter (Signed)
Called and spoke to patient about upcoming covid test. Patient had a clear understanding nothing further needed.

## 2021-07-27 ENCOUNTER — Telehealth: Payer: Self-pay

## 2021-07-27 ENCOUNTER — Other Ambulatory Visit
Admission: RE | Admit: 2021-07-27 | Discharge: 2021-07-27 | Disposition: A | Discharge status: Home / Self Care | Payer: Medicare HMO | Source: Ambulatory Visit | Attending: Pulmonary Disease | Admitting: Pulmonary Disease

## 2021-07-27 ENCOUNTER — Other Ambulatory Visit: Payer: Self-pay

## 2021-07-27 DIAGNOSIS — Z01812 Encounter for preprocedural laboratory examination: Secondary | ICD-10-CM | POA: Insufficient documentation

## 2021-07-27 DIAGNOSIS — Z20822 Contact with and (suspected) exposure to covid-19: Secondary | ICD-10-CM | POA: Insufficient documentation

## 2021-07-27 NOTE — Telephone Encounter (Signed)
PT AWARE OF COVID TEST, NOTHING FURTHER NEEDED.

## 2021-07-28 ENCOUNTER — Ambulatory Visit: Payer: Medicare HMO | Attending: Pulmonary Disease

## 2021-07-28 DIAGNOSIS — J449 Chronic obstructive pulmonary disease, unspecified: Secondary | ICD-10-CM | POA: Diagnosis not present

## 2021-07-28 DIAGNOSIS — R059 Cough, unspecified: Secondary | ICD-10-CM | POA: Insufficient documentation

## 2021-07-28 DIAGNOSIS — R0600 Dyspnea, unspecified: Secondary | ICD-10-CM | POA: Insufficient documentation

## 2021-07-28 LAB — SARS CORONAVIRUS 2 (TAT 6-24 HRS): SARS Coronavirus 2: NEGATIVE

## 2021-07-28 MED ORDER — ALBUTEROL SULFATE (2.5 MG/3ML) 0.083% IN NEBU
2.5000 mg | INHALATION_SOLUTION | Freq: Once | RESPIRATORY_TRACT | Status: AC
  Administered 2021-07-28: 2.5 mg via RESPIRATORY_TRACT
  Filled 2021-07-28: qty 3

## 2021-07-31 ENCOUNTER — Other Ambulatory Visit: Payer: Self-pay | Admitting: *Deleted

## 2021-07-31 MED ORDER — EMPAGLIFLOZIN 10 MG PO TABS: ORAL_TABLET | ORAL | 4 refills | Status: DC

## 2021-08-02 ENCOUNTER — Encounter: Payer: Self-pay | Admitting: Gastroenterology

## 2021-08-02 ENCOUNTER — Ambulatory Visit (INDEPENDENT_AMBULATORY_CARE_PROVIDER_SITE_OTHER): Payer: Medicare HMO | Admitting: Gastroenterology

## 2021-08-02 ENCOUNTER — Other Ambulatory Visit: Payer: Self-pay

## 2021-08-02 VITALS — BP 100/74 | HR 99 | Temp 97.7°F | Ht 66.0 in | Wt 195.6 lb

## 2021-08-02 DIAGNOSIS — D500 Iron deficiency anemia secondary to blood loss (chronic): Secondary | ICD-10-CM | POA: Diagnosis not present

## 2021-08-02 NOTE — Progress Notes (Signed)
Gastroenterology Consultation  Referring Provider:     [name], MD Primary Care Physician:  [name], MD Primary Gastroenterologist:  Dr. Allen Norris     Reason for Consultation:     Anemia        HPI:   Julian Sparks is a 75 y.o. y/o male referred for consultation & management of anemia by Dr. [name], MD. this patient comes in today after having longstanding anemia.  The patient most recent blood tests showed the hemoglobin and hematocrit to be normal.  The pattern has been:  Component     Latest Ref Rng & Units 02/14/2021 04/13/2021 04/14/2021 04/17/2021             Hemoglobin     13.0 - 17.0 g/dL 9.7 (L) 10.5 (L) 9.4 (L) 9.8 (L)  HCT     39.0 - 52.0 % 31.2 (L) 35.7 (L) 31.4 (L) 35.0 (L)  MCV     80.0 - 100.0 fL 96.3 96.2 93.5 93.8   Component     Latest Ref Rng & Units 06/28/2021          Hemoglobin     13.0 - 17.0 g/dL 13.9  HCT     39.0 - 52.0 % 43.1  MCV     80.0 - 100.0 fL 90.5    The patient's MCV has been normal and he denies any black stools bloody stools or any sign of GI bleeding.  The patient has not had a colonoscopy or upper endoscopy in the past.  The patient does not appear to have had any iron studies.  He denies any change in bowel habits constipation diarrhea or abdominal pain.  He does suffer from significant COPD.  Past Medical History:  Diagnosis Date   CHF (congestive heart failure) (HCC)    Chronic bronchitis (HCC)    COPD (chronic obstructive pulmonary disease) (HCC)    Diabetes mellitus without complication (HCC)    Diastolic dysfunction    Dyspnea    with exertion   Hypertension     Past Surgical History:  Procedure Laterality Date   CIRCUMCISION     SHOULDER ARTHROSCOPY WITH OPEN ROTATOR CUFF REPAIR Left 06/20/2016   Procedure: SHOULDER ARTHROSCOPY WITH OPEN ROTATOR CUFF REPAIR;  Surgeon: Earnestine Leys, MD;  Location: [facility] ORS;  Service: Orthopedics;  Laterality: Left;   UPPER GI ENDOSCOPY      Prior to Admission  medications   Medication Sig Start Date End Date Taking? Authorizing Provider  ACCU-CHEK AVIVA PLUS test strip TEST BLOOD SUGAR TWICE DAILY 11/01/20  Yes Masoud, [name], MD  Accu-Chek Softclix Lancets lancets Check blood sugar two times daily 11/01/20  Yes Masoud, [name], MD  albuterol (VENTOLIN HFA) 108 (90 Base) MCG/ACT inhaler Inhale 2 puffs into the lungs as needed for wheezing or shortness of breath. 11/09/20  Yes Masoud, [name], MD  Alcohol Swabs (B-D SINGLE USE SWABS REGULAR) PADS USE AS DIRECTED 12/27/20  Yes Masoud, [name], MD  aspirin 81 MG EC tablet Take 81 mg by mouth daily. Swallow whole.    Yes [provider]  atorvastatin (LIPITOR) 40 MG tablet TAKE 1 TABLET EVERY DAY 02/28/21  Yes Masoud, [name], MD  B Complex-C (SUPER B COMPLEX PO) Take 1 tablet by mouth daily.    Yes [provider]  Blood Glucose Monitoring Suppl (ACCU-CHEK AVIVA PLUS) w/Device KIT Check blood sugar BID 11/01/20  Yes Masoud, [name], MD  Budeson-Glycopyrrol-Formoterol (BREZTRI AEROSPHERE) 160-9-4.8 MCG/ACT AERO Inhale 2 puffs into the  lungs in the morning and at bedtime. 06/28/21  Yes Tyler Pita, MD  empagliflozin (JARDIANCE) 10 MG TABS tablet TAKE 1 TABLET(10 MG) BY MOUTH DAILY 07/31/21  Yes Masoud, [name], MD  fluticasone (FLONASE) 50 MCG/ACT nasal spray Place 1 spray into both nostrils 2 (two) times a day.  11/04/18  Yes [provider]  furosemide (LASIX) 20 MG tablet For weight 197 or less take lasix 40 daily. For weight 198 and higher take lasix 40 twice a day. For weight 201 or higher take metolazone 5 mg then 30 minutes later take lasix 40 mg (once) 06/07/21  Yes Gollan, Kathlene November, MD  gabapentin (NEURONTIN) 300 MG capsule Take 1 capsule (300 mg total) by mouth 3 (three) times daily. 09/13/20  Yes Masoud, [name], MD  gentamicin cream (GARAMYCIN) 0.1 % Apply 1 application topically 2 (two) times daily. 03/20/21  Yes Edrick Kins, DPM  ipratropium-albuterol (DUONEB) 0.5-2.5 (3) MG/3ML SOLN INHALE  THE CONTENTS OF 1 VIAL VIA NEBULIZER EVERY 4 HOURS AS NEEDED 06/21/21  Yes Masoud, [name], MD  metFORMIN (GLUCOPHAGE) 500 MG tablet TAKE 1 TABLET TWICE DAILY 03/16/21  Yes Masoud, [name], MD  metolazone (ZAROXOLYN) 5 MG tablet For weight 201 or higher take metolazone 5 mg, then 30 minutes later take lasix 40 mg 06/07/21  Yes Gollan, Kathlene November, MD  metoprolol succinate (TOPROL-XL) 25 MG 24 hr tablet TAKE 1 TABLET(25 MG) BY MOUTH DAILY 04/21/21  Yes [name], MD  predniSONE (DELTASONE) 5 MG tablet Take 1 tablet (5 mg total) by mouth daily with breakfast. 06/29/21  Yes Tyler Pita, MD  Pseudoeph-Doxylamine-DM-APAP (NYQUIL PO) Take by mouth as needed.   Yes [provider]  traZODone (DESYREL) 50 MG tablet TAKE 2 TABLETS EVERY DAY 05/08/21  Yes Masoud, [name], MD  benzonatate (TESSALON PERLES) 100 MG capsule Take 1 capsule (100 mg total) by mouth 3 (three) times daily as needed for cough. Patient not taking: Reported on 08/02/2021 04/28/21   Alisa Graff, FNP  predniSONE (STERAPRED UNI-PAK 21 TAB) 10 MG (21) TBPK tablet Use as directed Patient not taking: Reported on 08/02/2021 07/17/21   Tyler Pita, MD    Family History  Problem Relation Age of Onset   Hypertension Mother    Cancer Sister    Diabetes Brother      Social History   Tobacco Use   Smoking status: Former    Packs/day: 2.00    Years: 53.00    Pack years: 106.00    Types: Cigarettes    Quit date: 2014    Years since quitting: 8.9   Smokeless tobacco: Former    Types: Nurse, children's Use: Never used  Substance Use Topics   Alcohol use: No   Drug use: No    Allergies as of 08/02/2021 - Review Complete 08/02/2021  Allergen Reaction Noted   Aspirin Nausea Only and Other (See Comments) 06/12/2016   Penicillins Rash 06/12/2016    Review of Systems:    All systems reviewed and negative except where noted in HPI.   Physical Exam:  BP 100/74 (BP Location: Right Arm, Patient Position:  Sitting, Cuff Size: Large)   Pulse 99   Temp 97.7 F (36.5 C) (Temporal)   Ht 5' 6"  (1.676 m)   Wt 195 lb 9.6 oz (88.7 kg)   BMI 31.57 kg/m  No LMP for male patient. General:   Alert,  Well-developed, well-nourished, pleasant and cooperative in NAD Head:  Normocephalic and  atraumatic. Eyes:  Sclera clear, no icterus.   Conjunctiva pink. Ears:  Normal auditory acuity. Neck:  Supple; no masses or thyromegaly. Lungs: Decreased breath sounds bilaterally  heart:  Regular rate and rhythm; no murmurs, clicks, rubs, or gallops. Abdomen:  Normal bowel sounds.  No bruits.  Soft, non-tender and non-distended without masses, hepatosplenomegaly or hernias noted.  No guarding or rebound tenderness.  Negative Carnett sign.   Rectal:  Deferred.  Pulses:  Normal pulses noted. Extremities:  No clubbing or edema.  No cyanosis. Neurologic:  Alert and oriented x3;  grossly normal neurologically. Skin:  Intact without significant lesions or rashes.  No jaundice. Lymph Nodes:  No significant cervical adenopathy. Psych:  Alert and cooperative. Normal mood and affect.  Imaging Studies: Pulmonary Function Test [facility] Only  Result Date: 07/28/2021 Spirometry Data Is Acceptable and Reproducible, difficult time with test Severe Obstructive Airways Disease without Significant BD response +air trapping(Increased RV)  and  +hyperinflation Clinical Correlation Advised   Assessment and Plan:   Julian Sparks is a 75 y.o. y/o male who comes in today with a finding of anemia.  The patient has had chronic anemia but his most recent blood work showed him to have a hemoglobin of 13.9 and hematocrit of 43.1.  The patient's MCV has been normal and I do not see any iron studies.  The patient will have a CBC checked and his iron studies checked.  The patient has been explained the plan and agrees with it.    Lucilla Lame, MD. Marval Regal    Note: This dictation was prepared with Dragon dictation along with smaller phrase  technology. Any transcriptional errors that result from this process are unintentional.

## 2021-08-03 LAB — IRON AND TIBC
Iron Saturation: 20 % (ref 15–55)
Iron: 59 ug/dL (ref 38–169)
Total Iron Binding Capacity: 291 ug/dL (ref 250–450)
UIBC: 232 ug/dL (ref 111–343)

## 2021-08-03 LAB — FERRITIN: Ferritin: 95 ng/mL (ref 30–400)

## 2021-08-03 LAB — CBC WITH DIFFERENTIAL/PLATELET
Basophils Absolute: 0.1 10*3/uL (ref 0.0–0.2)
Basos: 1 %
EOS (ABSOLUTE): 0.2 10*3/uL (ref 0.0–0.4)
Eos: 2 %
Hematocrit: 45.4 % (ref 37.5–51.0)
Hemoglobin: 14.7 g/dL (ref 13.0–17.7)
Immature Grans (Abs): 0.3 10*3/uL — ABNORMAL HIGH (ref 0.0–0.1)
Immature Granulocytes: 2 %
Lymphocytes Absolute: 4.2 10*3/uL — ABNORMAL HIGH (ref 0.7–3.1)
Lymphs: 32 %
MCH: 28.8 pg (ref 26.6–33.0)
MCHC: 32.4 g/dL (ref 31.5–35.7)
MCV: 89 fL (ref 79–97)
Monocytes Absolute: 0.8 10*3/uL (ref 0.1–0.9)
Monocytes: 6 %
Neutrophils Absolute: 7.7 10*3/uL — ABNORMAL HIGH (ref 1.4–7.0)
Neutrophils: 57 %
Platelets: 208 10*3/uL (ref 150–450)
RBC: 5.11 x10E6/uL (ref 4.14–5.80)
RDW: 15.9 % — ABNORMAL HIGH (ref 11.6–15.4)
WBC: 13.2 10*3/uL — ABNORMAL HIGH (ref 3.4–10.8)

## 2021-08-07 ENCOUNTER — Telehealth: Payer: Self-pay

## 2021-08-07 NOTE — Telephone Encounter (Signed)
-----   Message from Lucilla Lame, MD sent at 08/03/2021 10:53 AM EST ----- Please let the patient know that his blood count is normal and his iron studies are normal and he does not need any GI work-up.

## 2021-08-07 NOTE — Telephone Encounter (Signed)
Pt notified of lab results

## 2021-08-10 ENCOUNTER — Ambulatory Visit: Payer: Medicare HMO | Attending: Family | Admitting: Family

## 2021-08-10 ENCOUNTER — Encounter: Payer: Self-pay | Admitting: Family

## 2021-08-10 ENCOUNTER — Other Ambulatory Visit: Payer: Self-pay

## 2021-08-10 VITALS — BP 160/83 | HR 101 | Resp 16 | Ht 66.0 in | Wt 200.1 lb

## 2021-08-10 DIAGNOSIS — E119 Type 2 diabetes mellitus without complications: Secondary | ICD-10-CM | POA: Insufficient documentation

## 2021-08-10 DIAGNOSIS — J449 Chronic obstructive pulmonary disease, unspecified: Secondary | ICD-10-CM | POA: Insufficient documentation

## 2021-08-10 DIAGNOSIS — M7989 Other specified soft tissue disorders: Secondary | ICD-10-CM | POA: Diagnosis not present

## 2021-08-10 DIAGNOSIS — I10 Essential (primary) hypertension: Secondary | ICD-10-CM

## 2021-08-10 DIAGNOSIS — R238 Other skin changes: Secondary | ICD-10-CM

## 2021-08-10 DIAGNOSIS — Z79899 Other long term (current) drug therapy: Secondary | ICD-10-CM | POA: Diagnosis not present

## 2021-08-10 DIAGNOSIS — Z9981 Dependence on supplemental oxygen: Secondary | ICD-10-CM | POA: Insufficient documentation

## 2021-08-10 DIAGNOSIS — I5032 Chronic diastolic (congestive) heart failure: Secondary | ICD-10-CM | POA: Diagnosis not present

## 2021-08-10 DIAGNOSIS — I110 Hypertensive heart disease with heart failure: Secondary | ICD-10-CM | POA: Insufficient documentation

## 2021-08-10 DIAGNOSIS — Z87891 Personal history of nicotine dependence: Secondary | ICD-10-CM | POA: Insufficient documentation

## 2021-08-10 NOTE — Patient Instructions (Signed)
Continue weighing daily and call for an overnight weight gain of 3 pounds or more or a weekly weight gain of more than 5 pounds.  °

## 2021-08-10 NOTE — Progress Notes (Signed)
Patient ID: Julian Sparks, male    DOB: 03-29-1946, 75 y.o.   MRN: [id number]  HPI  Ms Rathert is a 75 y/o male with a history of DM, HTN, COPD, previous tobacco use and chronic heart failure.   Echo report from 01/24/21 reviewed and showed an EF of 50-55% with severe RAE but without LVH.   Admitted 04/13/21 due to worsening shortness of breath due to COPD exacerbation and bilateral pneumonia. Started using NIV/trilogy. Antibiotic treatment finished and prednisone taper given. Diuresed 14L with IV lasix with transition to oral diuretics. AMS improved after CO2 retention corrected. Discharged after 7 days.   He presents today for a follow-up visit with a chief complaint of moderate shortness of breath with little exertion. He describes this as chronic in nature having been present for several years. He has associated fatigue, productive cough, wheezing, chronic difficulty sleeping, wound on bottom of right foot and slight redness on right shin along with this. He denies any dizziness, abdominal distention, palpitations, pedal edema, chest pain or weight gain.   Using his nebulizer 3-4 times/ day, wearing his oxygen at 2L around the clock and NIV (with oxygen at 6L) at bedtime and during the day if resting.   Has slight redness on right shin that has been present for a few days. He says that it comes and goes. He has a scab on his right shin that has been present for ~ 1 year after his dog bit him in that area.   Has not taken any of his medications yet today. Has sliding scale for diuretic and has only taken extra diuretic once in the last few weeks.   Past Medical History:  Diagnosis Date   CHF (congestive heart failure) (HCC)    Chronic bronchitis (HCC)    COPD (chronic obstructive pulmonary disease) (HCC)    Diabetes mellitus without complication (HCC)    Diastolic dysfunction    Dyspnea    with exertion   Hypertension    Past Surgical History:  Procedure Laterality Date   CIRCUMCISION      SHOULDER ARTHROSCOPY WITH OPEN ROTATOR CUFF REPAIR Left 06/20/2016   Procedure: SHOULDER ARTHROSCOPY WITH OPEN ROTATOR CUFF REPAIR;  Surgeon: Earnestine Leys, MD;  Location: [facility] ORS;  Service: Orthopedics;  Laterality: Left;   UPPER GI ENDOSCOPY     Family History  Problem Relation Age of Onset   Hypertension Mother    Cancer Sister    Diabetes Brother    Social History   Tobacco Use   Smoking status: Former    Packs/day: 2.00    Years: 53.00    Pack years: 106.00    Types: Cigarettes    Quit date: 2014    Years since quitting: 8.9   Smokeless tobacco: Former    Types: Chew  Substance Use Topics   Alcohol use: No   Allergies  Allergen Reactions   Aspirin Nausea Only and Other (See Comments)    325 mg aspirin cause stomach upset   Penicillins Rash   Prior to Admission medications   Medication Sig Start Date End Date Taking? Authorizing Provider  ACCU-CHEK AVIVA PLUS test strip TEST BLOOD SUGAR TWICE DAILY 11/01/20  Yes Masoud, [name], MD  Accu-Chek Softclix Lancets lancets Check blood sugar two times daily 11/01/20  Yes Masoud, [name], MD  albuterol (VENTOLIN HFA) 108 (90 Base) MCG/ACT inhaler Inhale 2 puffs into the lungs as needed for wheezing or shortness of breath. 11/09/20  Yes [name], MD  Alcohol Swabs (B-D SINGLE USE SWABS REGULAR) PADS USE AS DIRECTED 12/27/20  Yes Masoud, [name], MD  aspirin 81 MG EC tablet Take 81 mg by mouth daily. Swallow whole.    Yes [provider]  atorvastatin (LIPITOR) 40 MG tablet TAKE 1 TABLET EVERY DAY 02/28/21  Yes Masoud, [name], MD  B Complex-C (SUPER B COMPLEX PO) Take 1 tablet by mouth daily.    Yes [provider]  Blood Glucose Monitoring Suppl (ACCU-CHEK AVIVA PLUS) w/Device KIT Check blood sugar BID 11/01/20  Yes Masoud, [name], MD  Budeson-Glycopyrrol-Formoterol (BREZTRI AEROSPHERE) 160-9-4.8 MCG/ACT AERO Inhale 2 puffs into the lungs in the morning and at bedtime. 06/28/21  Yes Tyler Pita, MD  empagliflozin  (JARDIANCE) 10 MG TABS tablet TAKE 1 TABLET(10 MG) BY MOUTH DAILY 07/31/21  Yes Masoud, [name], MD  fluticasone (FLONASE) 50 MCG/ACT nasal spray Place 1 spray into both nostrils 2 (two) times a day.  11/04/18  Yes [provider]  furosemide (LASIX) 20 MG tablet For weight 197 or less take lasix 40 daily. For weight 198 and higher take lasix 40 twice a day. For weight 201 or higher take metolazone 5 mg then 30 minutes later take lasix 40 mg (once) 06/07/21  Yes Gollan, Kathlene November, MD  gabapentin (NEURONTIN) 300 MG capsule Take 1 capsule (300 mg total) by mouth 3 (three) times daily. 09/13/20  Yes Masoud, [name], MD  gentamicin cream (GARAMYCIN) 0.1 % Apply 1 application topically 2 (two) times daily. 03/20/21  Yes Edrick Kins, DPM  ipratropium-albuterol (DUONEB) 0.5-2.5 (3) MG/3ML SOLN INHALE THE CONTENTS OF 1 VIAL VIA NEBULIZER EVERY 4 HOURS AS NEEDED 06/21/21  Yes Masoud, [name], MD  metFORMIN (GLUCOPHAGE) 500 MG tablet TAKE 1 TABLET TWICE DAILY 03/16/21  Yes Masoud, [name], MD  metolazone (ZAROXOLYN) 5 MG tablet For weight 201 or higher take metolazone 5 mg, then 30 minutes later take lasix 40 mg 06/07/21  Yes Gollan, Kathlene November, MD  metoprolol succinate (TOPROL-XL) 25 MG 24 hr tablet TAKE 1 TABLET(25 MG) BY MOUTH DAILY 04/21/21  Yes [name], MD  predniSONE (DELTASONE) 5 MG tablet Take 1 tablet (5 mg total) by mouth daily with breakfast. 06/29/21  Yes Tyler Pita, MD  Pseudoeph-Doxylamine-DM-APAP (NYQUIL PO) Take by mouth as needed.   Yes [provider]  traZODone (DESYREL) 50 MG tablet TAKE 2 TABLETS EVERY DAY 05/08/21  Yes [name], MD    Review of Systems  Constitutional:  Positive for fatigue. Negative for appetite change.  HENT:  Negative for congestion, postnasal drip and sore throat.   Eyes: Negative.   Respiratory:  Positive for cough (productive), shortness of breath ("about the same") and wheezing.   Cardiovascular:  Negative for chest pain, palpitations and  leg swelling.  Gastrointestinal:  Negative for abdominal distention and abdominal pain.  Endocrine: Negative.   Genitourinary: Negative.   Musculoskeletal:  Positive for arthralgias (left foot). Negative for back pain and neck pain.  Skin:  Positive for wound (bottom of right foot).  Allergic/Immunologic: Negative.   Neurological:  Negative for dizziness and light-headedness.  Hematological:  Negative for adenopathy. Bruises/bleeds easily.  Psychiatric/Behavioral:  Positive for sleep disturbance (sleeping on 2 pillows; oxygen at 2L around the clock; using NIV trilogy with 6L oxygen). Negative for dysphoric mood. The patient is not nervous/anxious.    Vitals:   08/10/21 0827  BP: (!) 160/83  Pulse: (!) 101  Resp: 16  SpO2: 97%  Weight: 200 lb 2 oz (90.8 kg)  Height: 5' 6"  (1.676 m)   Wt Readings from Last 3 Encounters:  08/10/21 200 lb 2 oz (90.8 kg)  08/02/21 195 lb 9.6 oz (88.7 kg)  06/28/21 200 lb 3.2 oz (90.8 kg)   Lab Results  Component Value Date   CREATININE 0.85 04/20/2021   CREATININE 0.91 04/18/2021   CREATININE 0.68 04/17/2021   Physical Exam Vitals and nursing note reviewed. Exam conducted with a chaperone present (daughter).  Constitutional:      Appearance: Normal appearance.  HENT:     Head: Normocephalic and atraumatic.  Cardiovascular:     Rate and Rhythm: Regular rhythm. Tachycardia present.  Pulmonary:     Effort: Pulmonary effort is normal. No respiratory distress.     Breath sounds: No wheezing or rales.  Abdominal:     General: There is no distension.     Palpations: Abdomen is soft.     Tenderness: There is no abdominal tenderness.  Musculoskeletal:        General: No tenderness.     Cervical back: Normal range of motion and neck supple.     Right lower leg: Edema (1+ pitting) present.     Left lower leg: No tenderness. Edema (1+ pitting) present.  Skin:    General: Skin is warm and dry.     Findings: Erythema (right shin; no warmth compared  to left leg) present.  Neurological:     General: No focal deficit present.     Mental Status: He is alert and oriented to person, place, and time.  Psychiatric:        Mood and Affect: Mood normal.        Behavior: Behavior normal.        Thought Content: Thought content normal.   Assessment & Plan:  1: Chronic heart failure with preserved ejection fraction without LVH/LAE- - NYHA class III (although improved) - euvolemic today - weighing daily; reminded to call for an overnight weight gain of > 2 pounds or a weekly weight gain of > 5 pounds - weight stable from last visit 3 months ago - not adding salt to his food and his daughter rinses any canned foods out; reminded to keep his daily sodium intake to <2015m/ day - saw cardiology (Rockey Situ 06/07/21 - has sliding scale for diuretic usage and has only had to take increased dose once - encouraged to elevate his legs when sitting for long periods of time - BNP 04/13/21 was 277.7  2: HTN- - BP elevated (160/83) but he hasn't taken any of his medications yet today; will do so upon his return home - saw PCP (Masoud) 05/31/21 - BMP 04/18/21 reviewed and showed sodium 139, potassium 4.2, creatinine 0.91 and GFR >60  3: DM- - A1c 01/24/21 was 6.8% - says glucose was 351 @ home; says that he tends to eat during the night because he's not sleeping  4: COPD- - wears oxgyen at 2L around the clock, continuous  - using NIV trilogy at bedtime as well as during the day and feels like that has made the biggest difference; wears oxygen at 6L with NIV - saw pulmonology (Patsey Berthold 06/28/21  5: Leg redness- - slight redness over right shin - no warmth compared to left shin - advised daughter to monitor and if she notices increase in redness or any warmth compared to other leg to let me know as he may need antibiotic coverage over the Christmas holiday   Patient did not bring his medications nor a  list. Each medication was verbally reviewed with the  patient and he was encouraged to bring the bottles to every visit to confirm accuracy of list.   Return in 3 months or sooner for any questions/problems before then

## 2021-08-23 ENCOUNTER — Other Ambulatory Visit: Payer: Self-pay | Admitting: Pulmonary Disease

## 2021-08-29 ENCOUNTER — Other Ambulatory Visit: Payer: Self-pay | Admitting: *Deleted

## 2021-08-29 ENCOUNTER — Other Ambulatory Visit: Payer: Self-pay | Admitting: Internal Medicine

## 2021-08-29 MED ORDER — GABAPENTIN 300 MG PO CAPS: ORAL_CAPSULE | ORAL | 3 refills | Status: DC

## 2021-08-29 MED ORDER — EMPAGLIFLOZIN 10 MG PO TABS: ORAL_TABLET | ORAL | 4 refills | Status: DC

## 2021-08-31 ENCOUNTER — Encounter: Payer: Self-pay | Admitting: Pulmonary Disease

## 2021-08-31 ENCOUNTER — Ambulatory Visit (INDEPENDENT_AMBULATORY_CARE_PROVIDER_SITE_OTHER): Payer: Medicare HMO | Admitting: Pulmonary Disease

## 2021-08-31 ENCOUNTER — Other Ambulatory Visit: Payer: Self-pay

## 2021-08-31 ENCOUNTER — Telehealth: Payer: Self-pay

## 2021-08-31 VITALS — BP 140/70 | HR 84 | Temp 97.3°F | Ht 66.0 in | Wt 199.8 lb

## 2021-08-31 DIAGNOSIS — Z7952 Long term (current) use of systemic steroids: Secondary | ICD-10-CM | POA: Diagnosis not present

## 2021-08-31 DIAGNOSIS — J9611 Chronic respiratory failure with hypoxia: Secondary | ICD-10-CM

## 2021-08-31 DIAGNOSIS — J440 Chronic obstructive pulmonary disease with acute lower respiratory infection: Secondary | ICD-10-CM | POA: Diagnosis not present

## 2021-08-31 DIAGNOSIS — J9612 Chronic respiratory failure with hypercapnia: Secondary | ICD-10-CM

## 2021-08-31 DIAGNOSIS — C8300 Small cell B-cell lymphoma, unspecified site: Secondary | ICD-10-CM

## 2021-08-31 DIAGNOSIS — J4550 Severe persistent asthma, uncomplicated: Secondary | ICD-10-CM

## 2021-08-31 DIAGNOSIS — G4733 Obstructive sleep apnea (adult) (pediatric): Secondary | ICD-10-CM | POA: Diagnosis not present

## 2021-08-31 DIAGNOSIS — J449 Chronic obstructive pulmonary disease, unspecified: Secondary | ICD-10-CM | POA: Diagnosis not present

## 2021-08-31 MED ORDER — BREZTRI AEROSPHERE 160-9-4.8 MCG/ACT IN AERO: 2.0000 | INHALATION_SPRAY | Freq: Two times a day (BID) | RESPIRATORY_TRACT | 3 refills | Status: DC

## 2021-08-31 MED ORDER — DOXYCYCLINE HYCLATE 100 MG PO TABS: 100.0000 mg | ORAL_TABLET | Freq: Two times a day (BID) | ORAL | 0 refills | Status: DC

## 2021-08-31 MED ORDER — BREZTRI AEROSPHERE 160-9-4.8 MCG/ACT IN AERO: 160.0000 ug | INHALATION_SPRAY | Freq: Two times a day (BID) | RESPIRATORY_TRACT | 0 refills | Status: DC

## 2021-08-31 MED ORDER — ALBUTEROL SULFATE (2.5 MG/3ML) 0.083% IN NEBU
2.5000 mg | INHALATION_SOLUTION | Freq: Four times a day (QID) | RESPIRATORY_TRACT | 3 refills | Status: DC | PRN
Start: 2021-08-31 — End: 2022-03-13

## 2021-08-31 NOTE — Telephone Encounter (Signed)
Per Dr Patsey Berthold, she would like him to be on 300mg . Thanks

## 2021-08-31 NOTE — Progress Notes (Signed)
Subjective:    Patient ID: Julian Sparks, male    DOB: 1946/06/12, 76 y.o.   MRN: [id number] Chief Complaint  Patient presents with   Follow-up    COPD-    HPI Patient is a 76 year old former smoker (106 PY) who presents for follow-up on severe COPD and chronic respiratory failure with hypoxia. This is a scheduled visit.  He was last seen on 28 June 2021 he is chronically on 2 to 4 L of oxygen during the day and uses a Trilogy ventilator at night. Uses 2 L/min with rest and 4 L with exertion. He has very prominent asthmatic component. During his last visit in November he noted that he had been relatively well.  He did not have any issues with fevers, chills or sweats.  He is currently on prednisone 5 mg as well as Breztri and as needed albuterol. He does not endorse any other symptomatology today with regards to dyspnea. He actually feels his lungs are "okay". No chest pain. He does endorse having difficulties with restless legs at night. This has been a chronic issue for him. He does have neuropathy and is already on Neurontin. No other symptoms endorsed.   DATA: 04/16/2014 PFT's [facility]): FEV1 1.02 L or 35% predicted.  FVC reported at 67.4% predicted.  Small airways component FEF 25-75% at 13.9%.  DLCO 59%.  These are the only numbers available on record. 01/24/2021 echocardiogram: LVEF 50 to 55%, hypokinesis of septal wall, grade 2 DD, RV function moderately reduced, RV size enlarged.  Right atrial size dilated.  No valvular abnormalities 04/06/2021 sleep study: Obstructive sleep apnea AHI 20.8, baseline oxygen saturation remained below 90% even with supplemental oxygen 05/09/2021 hypersensitivity pneumonitis ([facility] [employer]) panel: Normal    Review of Systems A 10 point review of systems was performed and it is as noted above otherwise negative.  Patient Active Problem List   Diagnosis Date Noted   Pneumonia of both lungs due to infectious organism    COPD with acute exacerbation (HCC)     OSA (obstructive sleep apnea) 04/11/2021   Ulcer of left foot (HCC) 03/08/2021   Paroxysmal atrial tachycardia (HCC)    Metabolic alkalosis 02/10/2021   Acute metabolic encephalopathy 02/10/2021   Lymphoma, small lymphocytic (HCC) 02/04/2021   Acute cor pulmonale (HCC)    Acute on chronic congestive heart failure (HCC)    Acute on chronic diastolic CHF (congestive heart failure) (HCC) 01/24/2021   Type 2 diabetes mellitus with hyperlipidemia (HCC) 01/24/2021   Acute on chronic respiratory failure (HCC) 01/24/2021   Hyperkalemia 01/24/2021   Obesity (BMI 30-39.9) 01/24/2021   Shortness of breath 10/26/2020   Essential hypertension 07/13/2020   Cellulitis of right lower extremity 07/13/2020   Need for influenza vaccination 05/11/2020   Chronic respiratory failure with hypoxia (HCC) 04/27/2020   Bronchitis 04/27/2020   Pleurodynia 04/27/2020   Obesity (BMI 30.0-34.9) 02/17/2020   Diabetes 1.5, managed as type 2 (HCC) 02/17/2020   Adhesive capsulitis of both shoulders 02/17/2020   Edema 07/19/2019   Leg swelling 07/19/2019   Diminished pulses in lower extremity 07/19/2019   Bursitis of shoulder 04/03/2019   Respiratory failure (HCC) 02/04/2019   Full thickness rotator cuff tear 05/23/2016   Social History   Tobacco Use   Smoking status: Former    Packs/day: 2.00    Years: 53.00    Pack years: 106.00    Types: Cigarettes    Quit date: 2014    Years since quitting: 9.0  Smokeless tobacco: Former    Types: Chew  Substance Use Topics   Alcohol use: No   Allergies  Allergen Reactions   Aspirin Nausea Only and Other (See Comments)    325 mg aspirin cause stomach upset   Penicillins Rash   Current Meds  Medication Sig   ACCU-CHEK AVIVA PLUS test strip TEST BLOOD SUGAR TWICE DAILY   Accu-Chek Softclix Lancets lancets Check blood sugar two times daily   albuterol (VENTOLIN HFA) 108 (90 Base) MCG/ACT inhaler Inhale 2 puffs into the lungs as needed for wheezing or  shortness of breath.   Alcohol Swabs (B-D SINGLE USE SWABS REGULAR) PADS USE AS DIRECTED   aspirin 81 MG EC tablet Take 81 mg by mouth daily. Swallow whole.    atorvastatin (LIPITOR) 40 MG tablet TAKE 1 TABLET EVERY DAY   B Complex-C (SUPER B COMPLEX PO) Take 1 tablet by mouth daily.    Blood Glucose Monitoring Suppl (ACCU-CHEK AVIVA PLUS) w/Device KIT Check blood sugar BID   Budeson-Glycopyrrol-Formoterol (BREZTRI AEROSPHERE) 160-9-4.8 MCG/ACT AERO Inhale 2 puffs into the lungs in the morning and at bedtime.   empagliflozin (JARDIANCE) 10 MG TABS tablet TAKE 1 TABLET(10 MG) BY MOUTH DAILY   fluticasone (FLONASE) 50 MCG/ACT nasal spray Place 1 spray into both nostrils 2 (two) times a day.    furosemide (LASIX) 20 MG tablet For weight 197 or less take lasix 40 daily. For weight 198 and higher take lasix 40 twice a day. For weight 201 or higher take metolazone 5 mg then 30 minutes later take lasix 40 mg (once)   gabapentin (NEURONTIN) 300 MG capsule TAKE 1 CAPSULE THREE TIMES DAILY   gentamicin cream (GARAMYCIN) 0.1 % Apply 1 application topically 2 (two) times daily.   ipratropium-albuterol (DUONEB) 0.5-2.5 (3) MG/3ML SOLN INHALE THE CONTENTS OF 1 VIAL VIA NEBULIZER EVERY 4 HOURS AS NEEDED   metFORMIN (GLUCOPHAGE) 500 MG tablet TAKE 1 TABLET TWICE DAILY   metolazone (ZAROXOLYN) 5 MG tablet For weight 201 or higher take metolazone 5 mg, then 30 minutes later take lasix 40 mg   metoprolol succinate (TOPROL-XL) 25 MG 24 hr tablet TAKE 1 TABLET(25 MG) BY MOUTH DAILY   predniSONE (DELTASONE) 5 MG tablet TAKE 1 TABLET(5 MG) BY MOUTH DAILY WITH BREAKFAST   Pseudoeph-Doxylamine-DM-APAP (NYQUIL PO) Take by mouth as needed.   traZODone (DESYREL) 50 MG tablet TAKE 2 TABLETS EVERY DAY   Immunization History  Administered Date(s) Administered   Fluad Quad(high Dose 65+) 05/11/2020, 05/31/2021   Influenza,inj,quad, With Preservative 06/27/2016   PFIZER(Purple Top)SARS-COV-2 Vaccination 11/23/2019,  12/16/2019   Pneumococcal Conjugate-13 04/16/2014   Pneumococcal-Unspecified 05/23/2014   Zoster Recombinat (Shingrix) 01/24/2015       Objective:   Physical Exam BP 140/70 (BP Location: Left Arm, Patient Position: Sitting, Cuff Size: Normal)   Pulse 84   Temp (!) 97.3 F (36.3 C) (Oral)   Ht 5\' 6"  (1.676 m)   Wt 199 lb 12.8 oz (90.6 kg)   SpO2 93%   BMI 32.25 kg/m  GENERAL: Overweight gentleman, no acute distress.  Presents in transport chair.  Comfortable on 2 l/min nasal cannula O2 saturating 95%.   HEAD: Normocephalic, atraumatic.  EYES: Pupils equal, round, reactive to light.  No scleral icterus.  MOUTH: Nose/mouth/throat not examined due to masking requirements for COVID 19. NECK: Supple. No thyromegaly. Trachea midline. No JVD.  No adenopathy. PULMONARY: Good air entry bilaterally.  Coarse breath sounds throughout otherwise no adventitious sounds. CARDIOVASCULAR: S1 and S2. Regular rate  and rhythm.  No rubs, murmurs or gallops heard.  ABDOMEN: Protuberant, soft, benign. MUSCULOSKELETAL: No joint deformity, no clubbing, no edema.  NEUROLOGIC: No overt focal deficit. SKIN: Intact,warm,dry.  Psoriatic changes vs. onychomycosis on the nails on left hand. PSYCH: Mood and behavior normal         Assessment & Plan:     ICD-10-CM   1. Severe persistent asthma dependent on systemic steroids  J45.50    Z79.52    Initiate Dupixent paperwork Patient willing to start therapy Continue prednisone for now    2. Stage 3 severe COPD by GOLD classification (HCC)  J44.9    Continue Breztri Continue as needed albuterol    3. Chronic respiratory failure with hypoxia and hypercapnia (HCC)  J96.11    J96.12    Continue oxygen supplementation Patient compliant    4. COPD with acute lower respiratory infection (HCC)  J44.0    Exit site clean twice daily x 7 days    5. Lymphoma, small lymphocytic (HCC)  C83.00    On observation protocol per oncology    6. OSA (obstructive sleep  apnea)  G47.33    On Trilogy vent Poorly compliant due to multiple exacerbations     Meds ordered this encounter  Medications   doxycycline (VIBRA-TABS) 100 MG tablet    Sig: Take 1 tablet (100 mg total) by mouth 2 (two) times daily.    Dispense:  14 tablet    Refill:  0   albuterol (PROVENTIL) (2.5 MG/3ML) 0.083% nebulizer solution    Sig: Take 3 mLs (2.5 mg total) by nebulization every 6 (six) hours as needed for wheezing or shortness of breath.    Dispense:  216 mL    Refill:  3    Discontinue duoneb   Budeson-Glycopyrrol-Formoterol (BREZTRI AEROSPHERE) 160-9-4.8 MCG/ACT AERO    Sig: Inhale 2 puffs into the lungs 2 (two) times daily.    Dispense:  32.1 g    Refill:  3   Budeson-Glycopyrrol-Formoterol (BREZTRI AEROSPHERE) 160-9-4.8 MCG/ACT AERO    Sig: Inhale 160 mcg into the lungs in the morning and at bedtime.    Dispense:  5.9 g    Refill:  0    Order Specific Question:   Lot Number?    Answer:   [id number]    Order Specific Question:   Expiration Date?    Answer:   02/24/2024    Order Specific Question:   NDC    Answer:   4540-9811-91 [[id number]]    Order Specific Question:   Quantity    Answer:   1   Will see the patient in follow-up in 4 to 6 weeks time he is to contact us prior to that time should any new difficulties arise.  Gailen Shelter, MD Advanced Bronchoscopy PCCM [facility]    *This note was dictated using voice recognition software/Dragon.  Despite best efforts to proofread, errors can occur which can change the meaning. Any transcriptional errors that result from this process are unintentional and may not be fully corrected at the time of dictation.

## 2021-08-31 NOTE — Telephone Encounter (Signed)
PAP form received, questions for household size and income information were not addressed at [place]. Will need to reach out to pt for f/u. Desired dose also not documented by MD.

## 2021-08-31 NOTE — Patient Instructions (Signed)
Has started the paperwork for the shot called Dupixent that will help with the asthma component of your COPD.  We sent in the prescription for Breztri 2 puffs twice a day.  Got a sample today.  We sent in the prescription for your rescue medication (albuterol) for your nebulizer.  We sent in an antibiotic to your regular pharmacy.  We will see you in follow-up in 4 to 6 weeks time call sooner should any new problems arise.

## 2021-08-31 NOTE — Telephone Encounter (Signed)
Received New start paperwork for [place]. Will update as we work through the benefits process.  Initiated a Prior Authorization request to [facility] for [place] via CoverMyMeds. Will submit request once OV notes from today are complete.   Key: [id number]

## 2021-08-31 NOTE — Telephone Encounter (Signed)
Dupixent forms signed and faxed over to [place] team.

## 2021-09-01 ENCOUNTER — Ambulatory Visit: Payer: Medicare HMO | Admitting: Cardiovascular Disease

## 2021-09-08 ENCOUNTER — Other Ambulatory Visit (HOSPITAL_COMMUNITY): Payer: Self-pay

## 2021-09-08 ENCOUNTER — Ambulatory Visit: Payer: Medicare HMO | Admitting: Physician Assistant

## 2021-09-08 NOTE — Telephone Encounter (Addendum)
08/31/21 OV note is still open. Proceeding to submit PA request to prevent delay of care. Will provide update once received.   Dupixent MyWay application placed in "Pending Info" folder.

## 2021-09-11 ENCOUNTER — Other Ambulatory Visit (HOSPITAL_COMMUNITY): Payer: Self-pay

## 2021-09-11 NOTE — Telephone Encounter (Signed)
Received notification from [facility] regarding a prior authorization for Julian Sparks. Authorization has been APPROVED from 08/27/2021 to 08/26/2022.   Per test claim, copay for initial 28 day supply (loading dose included) is $2,080.40; $1,416.84 of total copay is attributed to entering coverage gap.  Patient can fill through [place]: [phone]   Authorization # [id number]   Will await approval letter for submission to [employer].

## 2021-09-14 NOTE — Telephone Encounter (Signed)
Submitted Patient Assistance Application to [employer] for DUPIXENT along with provider portion, PA and income documents. Will update patient when we receive a response. ° °Fax# [phone] °Phone# [phone] ° °

## 2021-09-19 ENCOUNTER — Ambulatory Visit: Payer: Medicare HMO | Admitting: Podiatry

## 2021-09-21 ENCOUNTER — Other Ambulatory Visit: Payer: Self-pay

## 2021-09-21 ENCOUNTER — Emergency Department
Admission: EM | Admit: 2021-09-21 | Discharge: 2021-09-21 | Disposition: A | Discharge status: Home / Self Care | Payer: Medicare HMO | Attending: Emergency Medicine | Admitting: Emergency Medicine

## 2021-09-21 ENCOUNTER — Emergency Department: Payer: Medicare HMO

## 2021-09-21 DIAGNOSIS — R0602 Shortness of breath: Secondary | ICD-10-CM | POA: Diagnosis present

## 2021-09-21 DIAGNOSIS — I110 Hypertensive heart disease with heart failure: Secondary | ICD-10-CM | POA: Insufficient documentation

## 2021-09-21 DIAGNOSIS — E119 Type 2 diabetes mellitus without complications: Secondary | ICD-10-CM | POA: Insufficient documentation

## 2021-09-21 DIAGNOSIS — J449 Chronic obstructive pulmonary disease, unspecified: Secondary | ICD-10-CM | POA: Diagnosis not present

## 2021-09-21 DIAGNOSIS — I5089 Other heart failure: Secondary | ICD-10-CM | POA: Diagnosis not present

## 2021-09-21 DIAGNOSIS — I509 Heart failure, unspecified: Secondary | ICD-10-CM | POA: Diagnosis not present

## 2021-09-21 DIAGNOSIS — J811 Chronic pulmonary edema: Secondary | ICD-10-CM | POA: Diagnosis not present

## 2021-09-21 DIAGNOSIS — I517 Cardiomegaly: Secondary | ICD-10-CM | POA: Diagnosis not present

## 2021-09-21 LAB — BASIC METABOLIC PANEL
Anion gap: 10 (ref 5–15)
BUN: 30 mg/dL — ABNORMAL HIGH (ref 8–23)
CO2: 34 mmol/L — ABNORMAL HIGH (ref 22–32)
Calcium: 8.8 mg/dL — ABNORMAL LOW (ref 8.9–10.3)
Chloride: 94 mmol/L — ABNORMAL LOW (ref 98–111)
Creatinine, Ser: 1 mg/dL (ref 0.61–1.24)
GFR, Estimated: >60 mL/min (ref >60)
Glucose, Bld: 361 mg/dL — ABNORMAL HIGH (ref 70–99)
Potassium: 4.9 mmol/L (ref 3.5–5.1)
Sodium: 138 mmol/L (ref 135–145)

## 2021-09-21 LAB — CBC
HCT: 45.1 % (ref 39.0–52.0)
Hemoglobin: 14 g/dL (ref 13.0–17.0)
MCH: 30.1 pg (ref 26.0–34.0)
MCHC: 31 g/dL (ref 30.0–36.0)
MCV: 97 fL (ref 80.0–100.0)
Platelets: 182 10*3/uL (ref 150–400)
RBC: 4.65 MIL/uL (ref 4.22–5.81)
RDW: 14.9 % (ref 11.5–15.5)
WBC: 10.3 10*3/uL (ref 4.0–10.5)
nRBC: 0 % (ref 0.0–0.2)

## 2021-09-21 LAB — BRAIN NATRIURETIC PEPTIDE: B Natriuretic Peptide: 63.8 pg/mL (ref 0.0–100.0)

## 2021-09-21 LAB — TROPONIN I (HIGH SENSITIVITY): Troponin I (High Sensitivity): 13 ng/L (ref <18)

## 2021-09-21 MED ORDER — IPRATROPIUM-ALBUTEROL 0.5-2.5 (3) MG/3ML IN SOLN
3.0000 mL | Freq: Once | RESPIRATORY_TRACT | Status: AC
  Administered 2021-09-21: 3 mL via RESPIRATORY_TRACT
  Filled 2021-09-21: qty 3

## 2021-09-21 MED ORDER — FUROSEMIDE 10 MG/ML IJ SOLN
80.0000 mg | Freq: Once | INTRAMUSCULAR | Status: AC
  Administered 2021-09-21: 80 mg via INTRAVENOUS
  Filled 2021-09-21: qty 8

## 2021-09-21 NOTE — ED Provider Notes (Signed)
[facility] Provider Note    Event Date/Time   First MD Initiated Contact with Patient 09/21/21 [phone]     (approximate)   History   Shortness of Breath   HPI  Julian Sparks is a 76 y.o. male with a history of CHF, COPD, diabetes, hypertension who presents with complaints of shortness of breath.  Patient reports he is on 2 L nasal cannula oxygen at home 24/7, breathing worsened yesterday, especially worse when he lies flat.  He reports 5 pound weight gain overnight.  No chest pain.  No cough or fever.     Physical Exam   Triage Vital Signs: ED Triage Vitals  Enc Vitals Group     BP 09/21/21 0946 (!) 137/112     Pulse Rate 09/21/21 0946 95     Resp 09/21/21 0946 (!) 24     Temp 09/21/21 0946 98.8 F (37.1 C)     Temp Source 09/21/21 0946 Oral     SpO2 09/21/21 0946 92 %     Weight 09/21/21 0947 88.9 kg (196 lb)     Height 09/21/21 0947 1.676 m (5\' 6" )     Head Circumference --      Peak Flow --      Pain Score 09/21/21 0947 0     Pain Loc --      Pain Edu? --      Excl. in [place]? --     Most recent vital signs: Vitals:   09/21/21 1130 09/21/21 1300  BP: 111/73 (!) 137/91  Pulse: 77 82  Resp: (!) 23 (!) 22  Temp:    SpO2: 94% 96%     General: Awake, no distress.  CV:  Good peripheral perfusion.  1+ lower extremity edema Resp:  Increased respiratory effort with mild tachypnea bibasilar Rales Abd:  No distention.  Other:     ED Results / Procedures / Treatments   Labs (all labs ordered are listed, but only abnormal results are displayed) Labs Reviewed  BASIC METABOLIC PANEL - Abnormal; Notable for the following components:      Result Value   Chloride 94 (*)    CO2 34 (*)    Glucose, Bld 361 (*)    BUN 30 (*)    Calcium 8.8 (*)    All other components within normal limits  CBC  BRAIN NATRIURETIC PEPTIDE  TROPONIN I (HIGH SENSITIVITY)     EKG  ED ECG REPORT I, Lavonia Drafts, the attending physician, personally viewed and  interpreted this ECG.  Date: 09/21/2021  Rhythm: normal sinus rhythm QRS Axis: normal Intervals: Right bundle block ST/T Wave abnormalities: normal Narrative Interpretation: no evidence of acute ischemia    RADIOLOGY Chest x-ray reviewed by me  likely mild edema    PROCEDURES:  Critical Care performed:   .1-3 Lead EKG Interpretation Performed by: Lavonia Drafts, MD Authorized by: Lavonia Drafts, MD     Interpretation: normal     ECG rate assessment: normal     Rhythm: sinus rhythm     Ectopy: none     Conduction: normal     MEDICATIONS ORDERED IN ED: Medications  furosemide (LASIX) injection 80 mg (80 mg Intravenous Given 09/21/21 1034)  ipratropium-albuterol (DUONEB) 0.5-2.5 (3) MG/3ML nebulizer solution 3 mL (3 mLs Nebulization Given 09/21/21 1233)     IMPRESSION / MDM / ASSESSMENT AND PLAN / ED COURSE  I reviewed the triage vital signs and the nursing notes.  Patient presents with shortness  of breath as detailed above.  Given weight gain, lower extremity edema and worsening shortness of breath highly suspicious for CHF exacerbation.  Oxygen saturations are above 90% on his baseline 2 L nasal cannula, mild tachypnea however.  We will give IV Lasix here while we await labs  Chest x-ray is consistent with CHF exacerbation.  The patient follows with Dr. Rockey Situ of cardiology.  I consulted with Dr. Rockey Situ, he agrees with IV Lasix here in the emergency department and if able to be discharged to take his Lasix twice daily and take metolazone as needed if significant weight gain  ----------------------------------------- 1:27 PM on 09/21/2021 ----------------------------------------- Ambulatory pulse ox appears to be at the patient's baseline, he feels well and is anxious to go home, discussed at length with his daughters who are in agreement.  Offered admission however the patient strongly desires to go home  I did set up an appointment with the patient at the CHF  clinic tomorrow at 10:30 AM, strict return precautions discussed          FINAL CLINICAL IMPRESSION(S) / ED DIAGNOSES   Final diagnoses:  Acute on chronic congestive heart failure, unspecified heart failure type ([place])     Rx / DC Orders   ED Discharge Orders     None        Note:  This document was prepared using Dragon voice recognition software and may include unintentional dictation errors.   Lavonia Drafts, MD 09/21/21 1328

## 2021-09-21 NOTE — Progress Notes (Deleted)
Patient ID: Julian Sparks, male    DOB: 05-07-1946, 76 y.o.   MRN: [id number]  HPI  Julian Sparks is a 76 y/o male with a history of DM, HTN, COPD, previous tobacco use and chronic heart failure.   Echo report from 01/24/21 reviewed and showed an EF of 50-55% with severe RAE but without LVH.   Was in the ED 09/21/21 due to acute on chronic heart failure. Worsened SOB and 5 pound overnight weight gain. Treated with IV lasix and released.         Admitted 04/13/21 due to worsening shortness of breath due to COPD exacerbation and bilateral pneumonia. Started using NIV/trilogy. Antibiotic treatment finished and prednisone taper given. Diuresed 14L with IV lasix with transition to oral diuretics. AMS improved after CO2 retention corrected. Discharged after 7 days.   He presents today for a follow-up visit with a chief complaint of   Past Medical History:  Diagnosis Date   CHF (congestive heart failure) ([place])    Chronic bronchitis ([place])    COPD (chronic obstructive pulmonary disease) ([place])    Diabetes mellitus without complication (HCC)    Diastolic dysfunction    Dyspnea    with exertion   Hypertension    Past Surgical History:  Procedure Laterality Date   CIRCUMCISION     SHOULDER ARTHROSCOPY WITH OPEN ROTATOR CUFF REPAIR Left 06/20/2016   Procedure: SHOULDER ARTHROSCOPY WITH OPEN ROTATOR CUFF REPAIR;  Surgeon: Earnestine Leys, MD;  Location: [facility] ORS;  Service: Orthopedics;  Laterality: Left;   UPPER GI ENDOSCOPY     Family History  Problem Relation Age of Onset   Hypertension Mother    Cancer Sister    Diabetes Brother    Social History   Tobacco Use   Smoking status: Former    Packs/day: 2.00    Years: 53.00    Pack years: 106.00    Types: Cigarettes    Quit date: 2014    Years since quitting: 9.0   Smokeless tobacco: Former    Types: Chew  Substance Use Topics   Alcohol use: No   Allergies  Allergen Reactions   Aspirin Nausea Only and Other (See Comments)    325 mg  aspirin cause stomach upset   Penicillins Rash     Review of Systems  Constitutional:  Positive for fatigue. Negative for appetite change.  HENT:  Negative for congestion, postnasal drip and sore throat.   Eyes: Negative.   Respiratory:  Positive for cough (productive), shortness of breath ("about the same") and wheezing.   Cardiovascular:  Negative for chest pain, palpitations and leg swelling.  Gastrointestinal:  Negative for abdominal distention and abdominal pain.  Endocrine: Negative.   Genitourinary: Negative.   Musculoskeletal:  Positive for arthralgias (left foot). Negative for back pain and neck pain.  Skin:  Positive for wound (bottom of right foot).  Allergic/Immunologic: Negative.   Neurological:  Negative for dizziness and light-headedness.  Hematological:  Negative for adenopathy. Bruises/bleeds easily.  Psychiatric/Behavioral:  Positive for sleep disturbance (sleeping on 2 pillows; oxygen at 2L around the clock; using NIV trilogy with 6L oxygen). Negative for dysphoric mood. The patient is not nervous/anxious.      Physical Exam Vitals and nursing note reviewed. Exam conducted with a chaperone present (daughter).  Constitutional:      Appearance: Normal appearance.  HENT:     Head: Normocephalic and atraumatic.  Cardiovascular:     Rate and Rhythm: Regular rhythm. Tachycardia present.  Pulmonary:  Effort: Pulmonary effort is normal. No respiratory distress.     Breath sounds: No wheezing or rales.  Abdominal:     General: There is no distension.     Palpations: Abdomen is soft.     Tenderness: There is no abdominal tenderness.  Musculoskeletal:        General: No tenderness.     Cervical back: Normal range of motion and neck supple.     Right lower leg: Edema (1+ pitting) present.     Left lower leg: No tenderness. Edema (1+ pitting) present.  Skin:    General: Skin is warm and dry.     Findings: Erythema (right shin; no warmth compared to left leg)  present.  Neurological:     General: No focal deficit present.     Mental Status: He is alert and oriented to person, place, and time.  Psychiatric:        Mood and Affect: Mood normal.        Behavior: Behavior normal.        Thought Content: Thought content normal.   Assessment & Plan:  1: Chronic heart failure with preserved ejection fraction without LVH/LAE- - NYHA class III (although improved) - euvolemic today - weighing daily; reminded to call for an overnight weight gain of > 2 pounds or a weekly weight gain of > 5 pounds - weight 200.2 from last visit 6 weeks ago - not adding salt to his food and his daughter rinses any canned foods out; reminded to keep his daily sodium intake to 2000mg / day - saw cardiology Rockey Situ) 06/07/21 - has sliding scale for diuretic usage and has only had to take increased dose once - encouraged to elevate his legs when sitting for long periods of time - BNP 09/21/21 was 63.8  2: HTN- - BP  - saw PCP (Mas[facility]) 05/31/21 - BMP 09/21/21 reviewed and showed sodium 138, potassium 4.9, creatinine 1.00 and GFR >60  3: DM- - A1c 04/14/21 was 6.8% - says glucose was  4: COPD- - wears oxgyen at 2L around the clock, continuous  - using NIV trilogy at bedtime as well as during the day and feels like that has made the biggest difference; wears oxygen at 6L with NIV - saw pulmonology Patsey Berthold) 08/31/21  5: Leg redness- - slight redness over right shin - no warmth compared to left shin - advised daughter to monitor and if she notices increase in redness or any warmth compared to other leg to let me know as he may need antibiotic coverage over the Christmas holiday   Patient did not bring his medications nor a list. Each medication was verbally reviewed with the patient and he was encouraged to bring the bottles to every visit to confirm accuracy of list.

## 2021-09-21 NOTE — ED Notes (Signed)
Pt ambulated down hall on 2L [place] and sats dropped to 85-87%, pt states he normally puts his oxygen up to 4L when ambulating and oxygen came up to 92% while ambulating. Pt states he did feel a little sob.

## 2021-09-21 NOTE — Discharge Instructions (Signed)
Please see Julian Sparks tomorrow at 10:30 AM!

## 2021-09-21 NOTE — ED Triage Notes (Signed)
Pt c/o increased SOB and wt gain of 5lbs in the past couple  days, pt has some tachypnea on arrival, pt is on 2L [place] all the time at home

## 2021-09-22 ENCOUNTER — Ambulatory Visit: Payer: Medicare HMO | Admitting: Podiatry

## 2021-09-22 ENCOUNTER — Ambulatory Visit: Payer: Medicare HMO | Admitting: Family

## 2021-09-22 ENCOUNTER — Telehealth: Payer: Self-pay

## 2021-09-22 NOTE — Telephone Encounter (Signed)
Julian Sparks called to cancel his appt today. He stated he was feeling much better after ED visit and he would rather just keep his original appt with [place] in March.  I informed him, per Darylene Price, FNP, that he can call us if he begins to experience symptoms again such as shortness of breath, swelling, or weight gain, and that we can see him sooner or give him IV lasix without having to go back to the emergency room.  Patient verbalized understanding and expressed gratitude for this service.  Georg Ruddle, RN

## 2021-09-27 NOTE — Telephone Encounter (Signed)
Called Dupixent MyWay to follow up on patient's application. Rep stated patient's app was missing the diagnosis code- provided verbally. Application will be sent off for review.  Phone# [phone]

## 2021-09-29 ENCOUNTER — Other Ambulatory Visit: Payer: Self-pay

## 2021-09-29 ENCOUNTER — Encounter: Payer: Self-pay | Admitting: Podiatry

## 2021-09-29 ENCOUNTER — Ambulatory Visit (INDEPENDENT_AMBULATORY_CARE_PROVIDER_SITE_OTHER): Payer: Medicare HMO | Admitting: Podiatry

## 2021-09-29 DIAGNOSIS — M79674 Pain in right toe(s): Secondary | ICD-10-CM | POA: Diagnosis not present

## 2021-09-29 DIAGNOSIS — I739 Peripheral vascular disease, unspecified: Secondary | ICD-10-CM | POA: Diagnosis not present

## 2021-09-29 DIAGNOSIS — L989 Disorder of the skin and subcutaneous tissue, unspecified: Secondary | ICD-10-CM

## 2021-09-29 DIAGNOSIS — M79675 Pain in left toe(s): Secondary | ICD-10-CM

## 2021-09-29 DIAGNOSIS — B351 Tinea unguium: Secondary | ICD-10-CM

## 2021-09-29 NOTE — Progress Notes (Signed)
° °  SUBJECTIVE Patient with a history of diabetes mellitus and peripheral vascular disease presents to office today complaining of elongated, thickened nails that cause pain while ambulating in shoes.  Patient is unable to trim their own nails.  Patient also develops preulcerative symptomatic callus lesions to the bilateral plantar aspect of the fifth MTP joint.  In the past these have ulcerated.  They are very symptomatic even with shoes.  Currently the patient does not have any diabetic shoes patient is here for further evaluation and treatment.   Past Medical History:  Diagnosis Date   CHF (congestive heart failure) (HCC)    Chronic bronchitis (HCC)    COPD (chronic obstructive pulmonary disease) (HCC)    Diabetes mellitus without complication (HCC)    Diastolic dysfunction    Dyspnea    with exertion   Hypertension     OBJECTIVE General Patient is awake, alert, and oriented x 3 and in no acute distress. Derm Skin is dry and supple bilateral. Negative open lesions or macerations. Remaining integument unremarkable. Nails are tender, long, thickened and dystrophic with subungual debris, consistent with onychomycosis, 1-5 bilateral. No signs of infection noted.  Hyperkeratotic preulcerative callus tissue noted to the plantar aspect of the fifth MTP bilateral Vasc  DP and PT pedal pulses palpable bilaterally. Temperature gradient within normal limits.  Neuro Epicritic and protective threshold sensation diminished bilaterally.  Musculoskeletal Exam No symptomatic pedal deformities noted bilateral. Muscular strength within normal limits.  ASSESSMENT 1. Diabetes Mellitus w/ peripheral neuropathy 2.  Pain due to onychomycosis of toenails bilateral 3.  Preulcerative callus lesions bilateral fifth MTP 4.  Peripheral vascular disease  PLAN OF CARE 1. Patient evaluated today. 2. Instructed to maintain good pedal hygiene and foot care. Stressed importance of controlling blood sugar.  3.  Mechanical debridement of nails 1-5 bilaterally performed using a nail nipper. Filed with dremel without incident.  4.  Excisional debridement of the hyperkeratotic preulcerative callus tissue was performed using a 312 scalpel.  There was some slight bleeding superficially to the plantar aspect of the fifth MTP right foot upon debridement.  Antibiotic cream and a Band-Aid was applied to both  5.  Continue management with vascular  6.  Appointment with Pedorthist for diabetic shoes and insoles  7.  Return to clinic in 3 mos.     Edrick Kins, DPM [facility]  Dr. Edrick Kins, DPM    [address], [place] [address]                Office [phone]  Fax [phone]

## 2021-10-03 ENCOUNTER — Telehealth: Payer: Self-pay | Admitting: Pulmonary Disease

## 2021-10-03 MED ORDER — AZITHROMYCIN 250 MG PO TABS: ORAL_TABLET | ORAL | 0 refills | Status: AC

## 2021-10-03 MED ORDER — METHYLPREDNISOLONE 4 MG PO TBPK: ORAL_TABLET | ORAL | 0 refills | Status: DC

## 2021-10-03 NOTE — Telephone Encounter (Signed)
Spoke to patient.  Patient reports of prod cough with green sputum, wheezing, fatigued and increased sob with exertion x3d.  Denied f/c/s or additional sx. 2 covid vaccines and flu shot.  Using breztri BID, albuterol HFA BID, prednisone 5mg  daily and albuterol solution TID. No recent covid or flu test. Wears 2-4L cont. Spo2 maintaining around 93%.  Dr. Patsey Berthold, please advise. Thanks

## 2021-10-03 NOTE — Telephone Encounter (Signed)
Send in a Medrol dose pack follow directions.  Hold on regular prednisone while on the Medrol. Sent in Azithromycin Z-Pak. Keep appointment already scheduled for the 16th.  If symptoms worsen go to ED.

## 2021-10-03 NOTE — Telephone Encounter (Signed)
Patient is aware of recommendations and voiced his understanding.  Medrol and zpak has been sent to preferred pharmacy.  Nothing further needed.

## 2021-10-03 NOTE — Telephone Encounter (Signed)
Returned call to [employer]. Per rep, they need patient's tax return to process application. Per rep, they reached out because provider's office because we can reach patient easier.  I spoke with patient briefly. He states he needs to get back to his oxygen which is not near his phone. Requested call back at 4:15p.  He states he does not file tax return but does have social security benefits letter. Pharmacy team will have to call patient back.  Knox Saliva, PharmD, MPH, BCPS Clinical Pharmacist (Rheumatology and Pulmonology)

## 2021-10-06 ENCOUNTER — Ambulatory Visit: Payer: Medicare HMO | Admitting: Cardiovascular Disease

## 2021-10-12 ENCOUNTER — Other Ambulatory Visit: Payer: Self-pay | Admitting: Pulmonary Disease

## 2021-10-12 ENCOUNTER — Encounter: Payer: Self-pay | Admitting: Pulmonary Disease

## 2021-10-12 ENCOUNTER — Other Ambulatory Visit: Payer: Self-pay

## 2021-10-12 ENCOUNTER — Ambulatory Visit: Payer: Medicare HMO | Admitting: Pulmonary Disease

## 2021-10-12 VITALS — BP 144/80 | HR 94 | Temp 98.2°F | Ht 66.0 in | Wt 201.2 lb

## 2021-10-12 DIAGNOSIS — J9612 Chronic respiratory failure with hypercapnia: Secondary | ICD-10-CM

## 2021-10-12 DIAGNOSIS — J9611 Chronic respiratory failure with hypoxia: Secondary | ICD-10-CM

## 2021-10-12 DIAGNOSIS — I5032 Chronic diastolic (congestive) heart failure: Secondary | ICD-10-CM

## 2021-10-12 DIAGNOSIS — G2581 Restless legs syndrome: Secondary | ICD-10-CM | POA: Diagnosis not present

## 2021-10-12 DIAGNOSIS — J4489 Other specified chronic obstructive pulmonary disease: Secondary | ICD-10-CM

## 2021-10-12 DIAGNOSIS — J449 Chronic obstructive pulmonary disease, unspecified: Secondary | ICD-10-CM

## 2021-10-12 MED ORDER — ROPINIROLE HCL 0.5 MG PO TABS: 0.5000 mg | ORAL_TABLET | Freq: Every day | ORAL | 1 refills | Status: DC

## 2021-10-12 NOTE — Progress Notes (Signed)
Subjective:    Patient ID: Julian Sparks, male    DOB: [birth year], 76 y.o.   MRN: [id number] Chief Complaint  Patient presents with   Follow-up   HPI Patient is a 76 year old former smoker (106 PY) who presents for follow-up on severe COPD and chronic respiratory failure with hypoxia.  This is a scheduled visit.  He is chronically on 2 to 4 L of oxygen during the day and uses a trilogy ventilator at night.  Uses 2 L/min with rest and 4 L with exertion.  He has very prominent asthmatic component.  During his last visit on 5 January we started the process to initiate [place] for the patient.  He has been approved now waiting medication assistance.  She has been having difficulties with sleep due to generalized pain.  He is currently on prednisone 5 mg as well as Breztri and as needed albuterol.  He does not endorse any other symptomatology today with regards to dyspnea.  He actually feels his lungs are "okay".  No chest pain.  He does endorse having difficulties with restless legs at night.  This has been a chronic issue for him.  He does have neuropathy and is already on Neurontin.  No other symptoms endorsed.  DATA: 04/16/2014 PFT's [facility]): FEV1 1.02 L or 35% predicted.  FVC reported at 67.4% predicted.  Small airways component FEF 25-75% at 13.9%.  DLCO 59%.  These are the only numbers available on record. 01/24/2021 echocardiogram: LVEF 50 to 55%, hypokinesis of septal wall, grade 2 DD, RV function moderately reduced, RV size enlarged.  Right atrial size dilated.  No valvular abnormalities 04/06/2021 sleep study: Obstructive sleep apnea AHI 20.8, baseline oxygen saturation remained below 90% even with supplemental oxygen 05/09/2021 hypersensitivity pneumonitis ([employer]) panel: Normal 06/28/2021, allergen panel/IgE: IgE 507, class II sensitivities to dust mites, molds and trees.  Review of Systems A 10 point review of systems was performed and it is as noted above otherwise  negative.  Patient Active Problem List   Diagnosis Date Noted   Pneumonia of both lungs due to infectious organism    COPD with acute exacerbation ([name])    OSA (obstructive sleep apnea) 04/11/2021   Ulcer of left foot ([place]) 03/08/2021   Paroxysmal atrial tachycardia (HCC)    Metabolic alkalosis 68/61/6837   Acute metabolic encephalopathy 29/09/1113   Lymphoma, small lymphocytic ([place]) 02/04/2021   Acute cor pulmonale (HCC)    Acute on chronic congestive heart failure (HCC)    Acute on chronic diastolic CHF (congestive heart failure) ([place]) 01/24/2021   Type 2 diabetes mellitus with hyperlipidemia ([place]) 01/24/2021   Acute on chronic respiratory failure ([place]) 01/24/2021   Hyperkalemia 01/24/2021   Obesity (BMI 30-39.9) 01/24/2021   Shortness of breath 10/26/2020   Essential hypertension 07/13/2020   Cellulitis of right lower extremity 07/13/2020   Need for influenza vaccination 05/11/2020   Chronic respiratory failure with hypoxia ([place]) 04/27/2020   Bronchitis 04/27/2020   Pleurodynia 04/27/2020   Obesity (BMI 30.0-34.9) 02/17/2020   Diabetes 1.5, managed as type 2 ([place]) 02/17/2020   Adhesive capsulitis of both shoulders 02/17/2020   Edema 07/19/2019   Leg swelling 07/19/2019   Diminished pulses in lower extremity 07/19/2019   Bursitis of shoulder 04/03/2019   Respiratory failure ([place]) 02/04/2019   Full thickness rotator cuff tear 05/23/2016   Social History   Tobacco Use   Smoking status: Former    Packs/day: 2.00    Years: 53.00    Pack years:  106.00    Types: Cigarettes    Quit date: 2014    Years since quitting: 9.1   Smokeless tobacco: Former    Types: Chew  Substance Use Topics   Alcohol use: No   Allergies  Allergen Reactions   Aspirin Nausea Only and Other (See Comments)    325 mg aspirin cause stomach upset   Penicillins Rash   Current Meds  Medication Sig   ACCU-CHEK AVIVA PLUS test strip TEST BLOOD SUGAR TWICE DAILY   Accu-Chek Softclix Lancets  lancets Check blood sugar two times daily   albuterol (PROVENTIL) (2.5 MG/3ML) 0.083% nebulizer solution Take 3 mLs (2.5 mg total) by nebulization every 6 (six) hours as needed for wheezing or shortness of breath.   albuterol (VENTOLIN HFA) 108 (90 Base) MCG/ACT inhaler Inhale 2 puffs into the lungs as needed for wheezing or shortness of breath.   Alcohol Swabs (B-D SINGLE USE SWABS REGULAR) PADS USE AS DIRECTED   aspirin 81 MG EC tablet Take 81 mg by mouth daily. Swallow whole.    atorvastatin (LIPITOR) 40 MG tablet TAKE 1 TABLET EVERY DAY   B Complex-C (SUPER B COMPLEX PO) Take 1 tablet by mouth daily.    Blood Glucose Monitoring Suppl (ACCU-CHEK AVIVA PLUS) w/Device KIT Check blood sugar BID   Budeson-Glycopyrrol-Formoterol (BREZTRI AEROSPHERE) 160-9-4.8 MCG/ACT AERO Inhale 2 puffs into the lungs 2 (two) times daily.   doxycycline (VIBRA-TABS) 100 MG tablet Take 1 tablet (100 mg total) by mouth 2 (two) times daily.   empagliflozin (JARDIANCE) 10 MG TABS tablet TAKE 1 TABLET(10 MG) BY MOUTH DAILY   fluticasone (FLONASE) 50 MCG/ACT nasal spray Place 1 spray into both nostrils 2 (two) times a day.    furosemide (LASIX) 20 MG tablet For weight 197 or less take lasix 40 daily. For weight 198 and higher take lasix 40 twice a day. For weight 201 or higher take metolazone 5 mg then 30 minutes later take lasix 40 mg (once)   gabapentin (NEURONTIN) 300 MG capsule TAKE 1 CAPSULE THREE TIMES DAILY   gentamicin cream (GARAMYCIN) 0.1 % Apply 1 application topically 2 (two) times daily.   metFORMIN (GLUCOPHAGE) 500 MG tablet TAKE 1 TABLET TWICE DAILY   methylPREDNISolone (MEDROL DOSEPAK) 4 MG TBPK tablet Use as directed   metolazone (ZAROXOLYN) 5 MG tablet For weight 201 or higher take metolazone 5 mg, then 30 minutes later take lasix 40 mg   metoprolol succinate (TOPROL-XL) 25 MG 24 hr tablet TAKE 1 TABLET(25 MG) BY MOUTH DAILY   predniSONE (DELTASONE) 5 MG tablet TAKE 1 TABLET(5 MG) BY MOUTH DAILY WITH  BREAKFAST   Pseudoeph-Doxylamine-DM-APAP (NYQUIL PO) Take by mouth as needed.   traZODone (DESYREL) 50 MG tablet TAKE 2 TABLETS EVERY DAY   Immunization History  Administered Date(s) Administered   Fluad Quad(high Dose 65+) 05/11/2020, 05/31/2021   Influenza,inj,quad, With Preservative 06/27/2016   PFIZER(Purple Top)SARS-COV-2 Vaccination 11/23/2019, 12/16/2019   Pneumococcal Conjugate-13 04/16/2014   Pneumococcal-Unspecified 05/23/2014   Zoster Recombinat (Shingrix) 01/24/2015       Objective:   Physical Exam BP (!) 144/80 (BP Location: Left Arm, Patient Position: Sitting, Cuff Size: Normal)    Pulse 94    Temp 98.2 F (36.8 C) (Oral)    Ht 5' 6"  (1.676 m)    Wt 201 lb 3.2 oz (91.3 kg)    SpO2 94%    BMI 32.47 kg/m  GENERAL: Overweight gentleman, no acute distress.  Looks tired.  Presents in transport chair. Comfortable on 2  l/min nasal cannula O2 saturating 94%.   HEAD: Normocephalic, atraumatic.  EYES: Pupils equal, round, reactive to light.  No scleral icterus.  MOUTH: Nose/mouth/throat not examined due to masking requirements for COVID 19. NECK: Supple. No thyromegaly. Trachea midline. No JVD.  No adenopathy. PULMONARY: Good air entry bilaterally.  Coarse breath sounds throughout otherwise no adventitious sounds. CARDIOVASCULAR: S1 and S2. Regular rate and rhythm.  No rubs, murmurs or gallops heard.  ABDOMEN: Protuberant, soft, benign. MUSCULOSKELETAL: No joint deformity, no clubbing, no edema.  NEUROLOGIC: No overt focal deficit. SKIN: Intact,warm,dry.  Psoriatic changes vs. onychomycosis on the nails on left hand. PSYCH: Mood and behavior normal     Assessment & Plan:     ICD-10-CM   1. Stage 3 severe COPD by GOLD classification ([place])  J44.9    Continue Breztri Continue as needed albuterol Prednisone 10 mg daily until Dupixent started    2. COPD with asthma ([place])  J44.9    Significant allergic and bronchospastic component Will initiate Dupixent soon     3. Chronic  respiratory failure with hypoxia and hypercapnia (HCC)  J96.11    J96.12    Continue supplemental oxygen Patient has been compliant    4. Chronic diastolic heart failure (HCC)  I50.32    Continue follow-up with primary care Keep blood pressure controlled    5. Restless legs syndrome  G25.81    Trial of ropinirole at nighttime     Meds ordered this encounter  Medications   rOPINIRole (REQUIP) 0.5 MG tablet    Sig: Take 1 tablet (0.5 mg total) by mouth at bedtime.    Dispense:  30 tablet    Refill:  1   We will see the patient in follow-up in 2 months time call sooner should any new problems arise.   Renold Don, MD Advanced Bronchoscopy PCCM [facility]    *This note was dictated using voice recognition software/Dragon.  Despite best efforts to proofread, errors can occur which can change the meaning. Any transcriptional errors that result from this process are unintentional and may not be fully corrected at the time of dictation.

## 2021-10-12 NOTE — Patient Instructions (Signed)
We are sending a prescription of Requip for you to help with your restless legs.  Take 2 tablets of prednisone (10 mg) daily until you start your Dupixent shot.  We will see you in follow-up in 2 months time call sooner should any new problems arise.

## 2021-10-12 NOTE — Telephone Encounter (Signed)
[employer] is requiring income documents. Spoke with patient's daughter, [name], who states she will stop by his house to find 2023 SS benefits letter and fax to [employer] on Monday or Tuesday. I advised her to ensure she puts cover page and notates that patient has application on file and his name/DOB. She verbalized understanding  Knox Saliva, PharmD, MPH, BCPS Clinical Pharmacist (Rheumatology and Pulmonology)

## 2021-10-20 ENCOUNTER — Ambulatory Visit (INDEPENDENT_AMBULATORY_CARE_PROVIDER_SITE_OTHER): Payer: Medicare HMO

## 2021-10-20 ENCOUNTER — Other Ambulatory Visit: Payer: Self-pay

## 2021-10-20 DIAGNOSIS — E785 Hyperlipidemia, unspecified: Secondary | ICD-10-CM

## 2021-10-20 DIAGNOSIS — L97522 Non-pressure chronic ulcer of other part of left foot with fat layer exposed: Secondary | ICD-10-CM

## 2021-10-20 DIAGNOSIS — E1169 Type 2 diabetes mellitus with other specified complication: Secondary | ICD-10-CM

## 2021-10-20 DIAGNOSIS — E08621 Diabetes mellitus due to underlying condition with foot ulcer: Secondary | ICD-10-CM

## 2021-10-20 NOTE — Progress Notes (Signed)
SITUATION Reason for Consult: Evaluation for Prefabricated Diabetic Shoes and Custom Diabetic Inserts. Patient / Caregiver Report: Patient would like well fitting shoes  OBJECTIVE DATA: Patient History / Diagnosis:    ICD-10-CM   1. Type 2 diabetes mellitus with hyperlipidemia (HCC)  E11.69    E78.5     2. Diabetic ulcer of toe of left foot associated with diabetes mellitus due to underlying condition, with fat layer exposed ([place])  E61.483    G73.543       Current or Previous Devices:   None and no history  In-Person Foot Examination: Ulcers & Callousing:   Ulcer of left 5th met head  Deformities:   - None   Shoe Size: 9W  ORTHOTIC RECOMMENDATION Recommended Devices: - 1x pair prefabricated PDAC approved diabetic shoes; Patient Selected - Apex A6000M Size 9W - 3x pair custom-to-patient PDAC approved vacuum formed diabetic insoles.  GOALS OF SHOES AND INSOLES - Reduce shear and pressure - Reduce / Prevent callus formation - Reduce / Prevent ulceration - Protect the fragile healing compromised diabetic foot.  Patient would benefit from diabetic shoes and inserts as patient has diabetes mellitus and the patient has one or more of the following conditions: - History of partial or complete amputation of the foot - History of previous foot ulceration. - History of pre-ulcerative callus - Peripheral neuropathy with evidence of callus formation - Foot deformity - Poor circulation  ACTIONS PERFORMED Patient was casted for insoles via crush box and measured for shoes via brannock device. Procedure was explained and patient tolerated procedure well. All questions were answered and concerns addressed.  PLAN Patient is to be contacted and scheduled for fitting once CMN is obtained from treaing physician and shoes and insoles have been fabricated and received.

## 2021-10-21 ENCOUNTER — Other Ambulatory Visit: Payer: Self-pay | Admitting: Pulmonary Disease

## 2021-10-24 ENCOUNTER — Telehealth: Payer: Self-pay

## 2021-10-24 ENCOUNTER — Other Ambulatory Visit: Payer: Self-pay | Admitting: Internal Medicine

## 2021-10-24 NOTE — Telephone Encounter (Signed)
Casts sent to central fabrication - HOLD FOR CMN °

## 2021-11-07 MED ORDER — DUPIXENT 300 MG/2ML ~~LOC~~ SOAJ: SUBCUTANEOUS | 1 refills | Status: DC

## 2021-11-07 NOTE — Telephone Encounter (Signed)
Per Dupixent rep, rx for Dupixent needs to be e-scribed to [facility] because application did not have diagnosis code. Rx escribed to [facility]. ? ?Knox Saliva, PharmD, MPH, BCPS ?Clinical Pharmacist (Rheumatology and Pulmonology) ?

## 2021-11-08 NOTE — Progress Notes (Signed)
? Patient ID: Julian Sparks, male    DOB: 27-Oct-1945, 76 y.o.   MRN: [id number] ? ?HPI ? ?Ms Train is a 77 y/o male with a history of DM, HTN, COPD, previous tobacco use and chronic heart failure.  ? ?Echo report from 01/24/21 reviewed and showed an EF of 50-55% with severe RAE but without LVH.  ? ?Was in the ED 09/21/21 due to acute on chronic HF with 5 pound weight gain overnight. Given IV lasix with improvement of symptoms and he was released.   ? ?He presents today for a follow-up visit with a chief complaint of moderate shortness of breath with minimal exertion. Describes this as being chronic in nature having been present for several years and feels like it's "the same as always". He has associated fatigue, cough, wheezing & easy bruising along with this. He denies any difficulty sleeping, dizziness, abdominal distention, palpitations, pedal edema, chest pain or weight gain.  ? ?He has not taken any of his medications yet today but will do so upon his return home. He is asking about not taking the furosemide on occasion as he gets so tired of going to the bathroom "all the time". Weight has been stable between 192-194 at home.  ? ?Past Medical History:  ?Diagnosis Date  ? CHF (congestive heart failure) ([place])   ? Chronic bronchitis ([place])   ? COPD (chronic obstructive pulmonary disease) ([place])   ? Diabetes mellitus without complication ([place])   ? Diastolic dysfunction   ? Dyspnea   ? with exertion  ? Hypertension   ? ?Past Surgical History:  ?Procedure Laterality Date  ? CIRCUMCISION    ? SHOULDER ARTHROSCOPY WITH OPEN ROTATOR CUFF REPAIR Left 06/20/2016  ? Procedure: SHOULDER ARTHROSCOPY WITH OPEN ROTATOR CUFF REPAIR;  Surgeon: Earnestine Leys, MD;  Location: [facility] ORS;  Service: Orthopedics;  Laterality: Left;  ? UPPER GI ENDOSCOPY    ? ?Family History  ?Problem Relation Age of Onset  ? Hypertension Mother   ? Cancer Sister   ? Diabetes Brother   ? ?Social History  ? ?Tobacco Use  ? Smoking status: Former  ?   Packs/day: 2.00  ?  Years: 53.00  ?  Pack years: 106.00  ?  Types: Cigarettes  ?  Quit date: 2014  ?  Years since quitting: 9.2  ? Smokeless tobacco: Former  ?  Types: Chew  ?Substance Use Topics  ? Alcohol use: No  ? ?Allergies  ?Allergen Reactions  ? Aspirin Nausea Only and Other (See Comments)  ?  325 mg aspirin cause stomach upset  ? Penicillins Rash  ? ?Prior to Admission medications   ?Medication Sig Start Date End Date Taking? Authorizing Provider  ?ACCU-CHEK AVIVA PLUS test strip TEST BLOOD SUGAR TWICE DAILY 11/01/20  Yes Masoud, [name], MD  ?Accu-Chek Softclix Lancets lancets CHECK BLOOD SUGAR TWO TIMES DAILY 10/24/21  Yes [name], MD  ?albuterol (PROVENTIL) (2.5 MG/3ML) 0.083% nebulizer solution Take 3 mLs (2.5 mg total) by nebulization every 6 (six) hours as needed for wheezing or shortness of breath. 08/31/21  Yes Tyler Pita, MD  ?albuterol (VENTOLIN HFA) 108 (90 Base) MCG/ACT inhaler Inhale 2 puffs into the lungs as needed for wheezing or shortness of breath. 11/09/20  Yes Masoud, [name], MD  ?Alcohol Swabs (B-D SINGLE USE SWABS REGULAR) PADS USE AS DIRECTED 12/27/20  Yes Masoud, [name], MD  ?aspirin 81 MG EC tablet Take 81 mg by mouth daily. Swallow whole.    Yes [provider]  ?  atorvastatin (LIPITOR) 40 MG tablet TAKE 1 TABLET EVERY DAY 02/28/21  Yes Masoud, [name], MD  ?B Complex-C (SUPER B COMPLEX PO) Take 1 tablet by mouth daily.    Yes [provider]  ?Blood Glucose Monitoring Suppl (ACCU-CHEK AVIVA PLUS) w/Device KIT Check blood sugar BID 11/01/20  Yes Masoud, [name], MD  ?Budeson-Glycopyrrol-Formoterol (BREZTRI AEROSPHERE) 160-9-4.8 MCG/ACT AERO Inhale 2 puffs into the lungs 2 (two) times daily. 08/31/21  Yes Tyler Pita, MD  ?empagliflozin (JARDIANCE) 10 MG TABS tablet TAKE 1 TABLET(10 MG) BY MOUTH DAILY 08/29/21  Yes Masoud, [name], MD  ?furosemide (LASIX) 20 MG tablet For weight 197 or less take lasix 40 daily. For weight 198 and higher take lasix 40 twice a day. For  weight 201 or higher take metolazone 5 mg then 30 minutes later take lasix 40 mg (once) 06/07/21  Yes Gollan, Kathlene November, MD  ?gabapentin (NEURONTIN) 300 MG capsule TAKE 1 CAPSULE THREE TIMES DAILY 08/29/21  Yes Masoud, [name], MD  ?gentamicin cream (GARAMYCIN) 0.1 % Apply 1 application topically 2 (two) times daily. 03/20/21  Yes Edrick Kins, DPM  ?metFORMIN (GLUCOPHAGE) 500 MG tablet TAKE 1 TABLET TWICE DAILY 03/16/21  Yes [name], MD  ?metolazone (ZAROXOLYN) 5 MG tablet For weight 201 or higher take metolazone 5 mg, then 30 minutes later take lasix 40 mg 06/07/21  Yes Gollan, Kathlene November, MD  ?metoprolol succinate (TOPROL-XL) 25 MG 24 hr tablet TAKE 1 TABLET(25 MG) BY MOUTH DAILY 04/21/21  Yes [name], MD  ?predniSONE (DELTASONE) 5 MG tablet Take 2 tablets (10 mg total) by mouth daily with breakfast. 10/23/21  Yes Tyler Pita, MD  ?Pseudoeph-Doxylamine-DM-APAP (NYQUIL PO) Take by mouth as needed.   Yes [provider]  ?rOPINIRole (REQUIP) 0.5 MG tablet TAKE 1 TABLET(0.5 MG) BY MOUTH AT BEDTIME 10/12/21  Yes Tyler Pita, MD  ?traZODone (DESYREL) 50 MG tablet TAKE 2 TABLETS EVERY DAY 05/08/21  Yes Masoud, [name], MD  ?Dupilumab (DUPIXENT) 300 MG/2ML SOPN Inject 633m into the skin at Week 0, then 3017mevery 14 days. ?Patient not taking: Reported on 11/09/2021 11/07/21   GoTyler PitaMD  ?fluticasone (F[facility] LLC50 MCG/ACT nasal spray Place 1 spray into both nostrils 2 (two) times a day.  ?Patient not taking: Reported on 11/09/2021 11/04/18   [provider]  ? ?Review of Systems  ?Constitutional:  Positive for fatigue. Negative for appetite change.  ?HENT:  Negative for congestion, postnasal drip and sore throat.   ?Eyes: Negative.   ?Respiratory:  Positive for cough (productive), shortness of breath ("about the same") and wheezing.   ?Cardiovascular:  Negative for chest pain, palpitations and leg swelling.  ?Gastrointestinal:  Negative for abdominal distention and abdominal  pain.  ?Endocrine: Negative.   ?Genitourinary: Negative.   ?Musculoskeletal:  Positive for arthralgias (left foot). Negative for back pain and neck pain.  ?Skin: Negative.   ?Allergic/Immunologic: Negative.   ?Neurological:  Negative for dizziness and light-headedness.  ?Hematological:  Negative for adenopathy. Bruises/bleeds easily.  ?Psychiatric/Behavioral:  Negative for dysphoric mood and sleep disturbance (sleeping on 2 pillows; oxygen at 2L around the clock; using NIV trilogy with 6L oxygen). The patient is not nervous/anxious.   ? ?Vitals:  ? 11/09/21 0832  ?BP: (!) 150/68  ?Pulse: 81  ?Resp: 16  ?SpO2: 99%  ?Weight: 198 lb 8 oz (90 kg)  ?Height: 5' 6"  (1.676 m)  ? ?Wt Readings from Last 3 Encounters:  ?11/09/21 198 lb 8 oz (90 kg)  ?10/12/21  201 lb 3.2 oz (91.3 kg)  ?09/21/21 196 lb (88.9 kg)  ? ?Lab Results  ?Component Value Date  ? CREATININE 1.00 09/21/2021  ? CREATININE 0.85 04/20/2021  ? CREATININE 0.91 04/18/2021  ? ?Physical Exam ?Vitals and nursing note reviewed. Exam conducted with a chaperone present (daughter).  ?Constitutional:   ?   Appearance: Normal appearance.  ?HENT:  ?   Head: Normocephalic and atraumatic.  ?Cardiovascular:  ?   Rate and Rhythm: Normal rate and regular rhythm.  ?Pulmonary:  ?   Effort: Pulmonary effort is normal. No respiratory distress.  ?   Breath sounds: Wheezing (RLL) present. No rales.  ?Abdominal:  ?   General: There is no distension.  ?   Palpations: Abdomen is soft.  ?   Tenderness: There is no abdominal tenderness.  ?Musculoskeletal:     ?   General: No tenderness.  ?   Cervical back: Normal range of motion and neck supple.  ?   Right lower leg: Edema (trace pitting) present.  ?   Left lower leg: No tenderness. Edema (trace pitting) present.  ?Skin: ?   General: Skin is warm and dry.  ?Neurological:  ?   General: No focal deficit present.  ?   Mental Status: He is alert and oriented to person, place, and time.  ?Psychiatric:     ?   Mood and Affect: Mood normal.      ?   Behavior: Behavior normal.     ?   Thought Content: Thought content normal.  ? ?Assessment & Plan: ? ?1: Chronic heart failure with preserved ejection fraction without LVH/LAE- ?- NYHA class III  ?- euvolemic to

## 2021-11-08 NOTE — Progress Notes (Signed)
Cardiology Office Note ? ?Date:  11/10/2021  ? ?ID:  Julian Sparks, DOB 1945/11/19, MRN [id number] ? ?PCP:  [name], MD  ? ?Chief Complaint  ?Patient presents with  ? 3 month follow up   ?  Patient c/o shortness of breath. Medications reviewed by the patient verbally.   ? ? ?HPI:  ?Julian Sparks is a 76 year old gentleman with history of  ?morbid obesity,  ?COPD, on home oxygen 2 L,  ?diabetes type 2,  ?hypertension,  ?presenting to the hospital June 2022 with worsening shortness of breath, hypoxia, leg swelling concerning for CHF ?Who presents for follow-up of his diastolic CHF ? ?Last seen in clinic June 07, 2021 ?Followed by CHF clinic ? ?Seen in the emergency room September 21, 2021 for shortness of breath ?Chest x-ray mild edema, given IV Lasix and DuoNeb and released ?It was recommended he take Lasix twice a day and metolazone as needed for weight gain ? ?Last seen in clinic July 2022 ?Seen by CHF clinic 1 month ago ? ?Weight at home 193 to 195 ?Weight is down 5 pounds from last clinic visit ?Has trace lower extremity edema on today's visit ? ?Denies any recent COPD exacerbation ? ?No chest pain concerning for angina ?No PND orthopnea ? ?EKG personally reviewed by myself on todays visit ?Normal sinus rhythm rate 80 bpm, PVCs left axis deviation/left anterior fascicular block ? ?Past medical history reviewed ?Prior notes reviewed, in the hospital June 2022 with ?Severe ACUTE Hypoxic and Hypercapnic Respiratory Failure due to pneumonia, acute severe decompensated combined systolic and diastolic heart failure on BiPAP ?Streptococcus Agalactiae pneumonia. ?Hemoptysis ?Lymphoma. ? ?CT chest ?Substantial increase in bulky celiac and retroperitoneal ?adenopathy compared to the 03/06/2019 PET-CT.  ?Appearance compatible with limb for proliferative process. ? ?hospital 03/2021 ?daughter checked his pulse oximetry on his 4 L of oxygen and his pulse oximetry was between 75 and 85%. ?Patient went to the heart failure  clinic and was also noted to be hypoxic with pulse oximetry in the 70s on his 4 L of oxygen  ?[place] at d/c 197 pounds,  ? ?On lasix 40 daily at d/c ?Weight today at home 199 pounds ?On prednisone leaving hospital, now complete ? ?Does nebulizers 4 times a day, very congested on today's visit ? ?Echocardiogram Jan 24, 2021 ? 1. Left ventricular ejection fraction, by estimation, is 50 to 55%. The  ?left ventricle has low normal function. Hypokinesis of septal wall. Left  ?ventricular diastolic parameters are consistent with Grade II diastolic  ?dysfunction (pseudonormalization).  ? 2. Right ventricular systolic function is moderately reduced. The right  ?ventricular size is severely enlarged. Tricuspid regurgitation signal is  ?inadequate for assessing PA pressure.  ? 3. Right atrial size was mildly dilated.  ? ? ? ?PMH:   has a past medical history of CHF (congestive heart failure) ([place]), Chronic bronchitis ([place]), COPD (chronic obstructive pulmonary disease) ([place]), Diabetes mellitus without complication ([place]), Diastolic dysfunction, Dyspnea, and Hypertension. ? ?PSH:    ?Past Surgical History:  ?Procedure Laterality Date  ? CIRCUMCISION    ? SHOULDER ARTHROSCOPY WITH OPEN ROTATOR CUFF REPAIR Left 06/20/2016  ? Procedure: SHOULDER ARTHROSCOPY WITH OPEN ROTATOR CUFF REPAIR;  Surgeon: Earnestine Leys, MD;  Location: [facility] ORS;  Service: Orthopedics;  Laterality: Left;  ? UPPER GI ENDOSCOPY    ? ? ?Current Outpatient Medications  ?Medication Sig Dispense Refill  ? ACCU-CHEK AVIVA PLUS test strip TEST BLOOD SUGAR TWICE DAILY 300 strip 3  ? Accu-Chek Softclix Lancets lancets  CHECK BLOOD SUGAR TWO TIMES DAILY 200 each 4  ? albuterol (PROVENTIL) (2.5 MG/3ML) 0.083% nebulizer solution Take 3 mLs (2.5 mg total) by nebulization every 6 (six) hours as needed for wheezing or shortness of breath. 216 mL 3  ? albuterol (VENTOLIN HFA) 108 (90 Base) MCG/ACT inhaler Inhale 2 puffs into the lungs as needed for wheezing or shortness of  breath. 18 g 3  ? Alcohol Swabs (B-D SINGLE USE SWABS REGULAR) PADS USE AS DIRECTED 300 each 3  ? aspirin 81 MG EC tablet Take 81 mg by mouth daily. Swallow whole.     ? atorvastatin (LIPITOR) 40 MG tablet TAKE 1 TABLET EVERY DAY 90 tablet 3  ? B Complex-C (SUPER B COMPLEX PO) Take 1 tablet by mouth daily.     ? Blood Glucose Monitoring Suppl (ACCU-CHEK AVIVA PLUS) w/Device KIT Check blood sugar BID 1 kit 3  ? Budeson-Glycopyrrol-Formoterol (BREZTRI AEROSPHERE) 160-9-4.8 MCG/ACT AERO Inhale 2 puffs into the lungs 2 (two) times daily. 32.1 g 3  ? empagliflozin (JARDIANCE) 10 MG TABS tablet TAKE 1 TABLET(10 MG) BY MOUTH DAILY 30 tablet 4  ? furosemide (LASIX) 20 MG tablet For weight 197 or less take lasix 40 daily. For weight 198 and higher take lasix 40 twice a day. For weight 201 or higher take metolazone 5 mg then 30 minutes later take lasix 40 mg (once) 200 tablet 3  ? gabapentin (NEURONTIN) 300 MG capsule TAKE 1 CAPSULE THREE TIMES DAILY 270 capsule 3  ? gentamicin cream (GARAMYCIN) 0.1 % Apply 1 application topically 2 (two) times daily. 30 g 1  ? metFORMIN (GLUCOPHAGE) 500 MG tablet TAKE 1 TABLET TWICE DAILY 180 tablet 3  ? metolazone (ZAROXOLYN) 5 MG tablet For weight 201 or higher take metolazone 5 mg, then 30 minutes later take lasix 40 mg 90 tablet 0  ? metoprolol succinate (TOPROL-XL) 25 MG 24 hr tablet TAKE 1 TABLET(25 MG) BY MOUTH DAILY 90 tablet 3  ? predniSONE (DELTASONE) 5 MG tablet Take 2 tablets (10 mg total) by mouth daily with breakfast. 60 tablet 1  ? Pseudoeph-Doxylamine-DM-APAP (NYQUIL PO) Take by mouth as needed.    ? rOPINIRole (REQUIP) 0.5 MG tablet TAKE 1 TABLET(0.5 MG) BY MOUTH AT BEDTIME 90 tablet 0  ? traZODone (DESYREL) 50 MG tablet TAKE 2 TABLETS EVERY DAY 180 tablet 3  ? Dupilumab (DUPIXENT) 300 MG/2ML SOPN Inject 655m into the skin at Week 0, then 3086mevery 14 days. (Patient not taking: Reported on 11/09/2021) 12 mL 1  ? fluticasone (FLONASE) 50 MCG/ACT nasal spray Place 1 spray  into both nostrils 2 (two) times a day.  (Patient not taking: Reported on 11/09/2021)    ? ?No current facility-administered medications for this visit.  ? ? ? ?Allergies:   Aspirin and Penicillins  ? ?Social History:  The patient  reports that he quit smoking about 9 years ago. His smoking use included cigarettes. He has a 106.00 pack-year smoking history. He has quit using smokeless tobacco.  His smokeless tobacco use included chew. He reports that he does not drink alcohol and does not use drugs.  ? ?Family History:   family history includes Cancer in his sister; Diabetes in his brother; Hypertension in his mother.  ? ? ?Review of Systems: ?Review of Systems  ?Constitutional: Negative.   ?HENT: Negative.    ?Respiratory:  Positive for shortness of breath.   ?Cardiovascular:  Positive for leg swelling.  ?Gastrointestinal: Negative.   ?Musculoskeletal: Negative.   ?Neurological: Negative.   ?  Psychiatric/Behavioral: Negative.    ?All other systems reviewed and are negative. ? ? ?PHYSICAL EXAM: ?VS:  BP 140/70 (BP Location: Left Arm, Patient Position: Sitting, Cuff Size: Normal)   Pulse 80   Ht 5' 6"  (1.676 m)   Wt 198 lb 2 oz (89.9 kg)   SpO2 92% Comment: 2 Liters of oxygen  BMI 31.98 kg/m?  , BMI Body mass index is 31.98 kg/m?Marland Kitchen ?Constitutional:  oriented to person, place, and time. No distress.  ?HENT:  ?Head: Grossly normal ?Eyes:  no discharge. No scleral icterus.  ?Neck: No JVD, no carotid bruits  ?Cardiovascular: Regular rate and rhythm, no murmurs appreciated ?Pulmonary/Chest: Mildly decreased breath sounds throughout no wheezes  ?Scattered Rales at the bases ?Abdominal: Soft.  no distension.  no tenderness.  ?Musculoskeletal: Normal range of motion ?Neurological:  normal muscle tone. Coordination normal. No atrophy ?Skin: Skin warm and dry ?Psychiatric: normal affect, pleasant ? ? ?Recent Labs: ?01/24/2021: TSH 1.152 ?02/14/2021: Magnesium 2.3 ?04/13/2021: ALT 16 ?09/21/2021: B Natriuretic Peptide 63.8; BUN  30; Creatinine, Ser 1.00; Hemoglobin 14.0; Platelets 182; Potassium 4.9; Sodium 138  ? ? ?Lipid Panel ?Lab Results  ?Component Value Date  ? CHOL 101 01/26/2021  ? HDL 46 01/26/2021  ? [place] 36 01/26/2021  ?

## 2021-11-09 ENCOUNTER — Ambulatory Visit: Payer: Medicare HMO | Attending: Family | Admitting: Family

## 2021-11-09 ENCOUNTER — Encounter: Payer: Self-pay | Admitting: Family

## 2021-11-09 ENCOUNTER — Other Ambulatory Visit: Payer: Self-pay

## 2021-11-09 VITALS — BP 150/68 | HR 81 | Resp 16 | Ht 66.0 in | Wt 198.5 lb

## 2021-11-09 DIAGNOSIS — I5032 Chronic diastolic (congestive) heart failure: Secondary | ICD-10-CM | POA: Diagnosis not present

## 2021-11-09 DIAGNOSIS — J449 Chronic obstructive pulmonary disease, unspecified: Secondary | ICD-10-CM | POA: Diagnosis not present

## 2021-11-09 DIAGNOSIS — Z87891 Personal history of nicotine dependence: Secondary | ICD-10-CM | POA: Insufficient documentation

## 2021-11-09 DIAGNOSIS — Z79899 Other long term (current) drug therapy: Secondary | ICD-10-CM | POA: Insufficient documentation

## 2021-11-09 DIAGNOSIS — Z9981 Dependence on supplemental oxygen: Secondary | ICD-10-CM | POA: Insufficient documentation

## 2021-11-09 DIAGNOSIS — I110 Hypertensive heart disease with heart failure: Secondary | ICD-10-CM | POA: Insufficient documentation

## 2021-11-09 DIAGNOSIS — Z09 Encounter for follow-up examination after completed treatment for conditions other than malignant neoplasm: Secondary | ICD-10-CM | POA: Diagnosis not present

## 2021-11-09 DIAGNOSIS — R0602 Shortness of breath: Secondary | ICD-10-CM | POA: Insufficient documentation

## 2021-11-09 DIAGNOSIS — E119 Type 2 diabetes mellitus without complications: Secondary | ICD-10-CM | POA: Diagnosis not present

## 2021-11-09 DIAGNOSIS — I10 Essential (primary) hypertension: Secondary | ICD-10-CM | POA: Diagnosis not present

## 2021-11-09 NOTE — Patient Instructions (Signed)
Continue weighing daily and call for an overnight weight gain of 3 pounds or more or a weekly weight gain of more than 5 pounds.   If you have voicemail, please make sure your mailbox is cleaned out so that we may leave a message and please make sure to listen to any voicemails.     

## 2021-11-10 ENCOUNTER — Other Ambulatory Visit: Payer: Self-pay

## 2021-11-10 ENCOUNTER — Ambulatory Visit: Payer: Medicare HMO | Admitting: Cardiovascular Disease

## 2021-11-10 ENCOUNTER — Encounter: Payer: Self-pay | Admitting: Cardiovascular Disease

## 2021-11-10 VITALS — BP 140/70 | HR 80 | Ht 66.0 in | Wt 198.1 lb

## 2021-11-10 DIAGNOSIS — J449 Chronic obstructive pulmonary disease, unspecified: Secondary | ICD-10-CM | POA: Diagnosis not present

## 2021-11-10 DIAGNOSIS — R609 Edema, unspecified: Secondary | ICD-10-CM

## 2021-11-10 DIAGNOSIS — G4733 Obstructive sleep apnea (adult) (pediatric): Secondary | ICD-10-CM | POA: Diagnosis not present

## 2021-11-10 DIAGNOSIS — C8590 Non-Hodgkin lymphoma, unspecified, unspecified site: Secondary | ICD-10-CM

## 2021-11-10 DIAGNOSIS — E119 Type 2 diabetes mellitus without complications: Secondary | ICD-10-CM | POA: Diagnosis not present

## 2021-11-10 DIAGNOSIS — I5032 Chronic diastolic (congestive) heart failure: Secondary | ICD-10-CM | POA: Diagnosis not present

## 2021-11-10 DIAGNOSIS — I10 Essential (primary) hypertension: Secondary | ICD-10-CM | POA: Diagnosis not present

## 2021-11-10 MED ORDER — EMPAGLIFLOZIN 10 MG PO TABS: ORAL_TABLET | ORAL | 3 refills | Status: DC

## 2021-11-10 NOTE — Patient Instructions (Addendum)
Medication Instructions:  ?No changes ? ?1) Lasix (furosemide): ?- For weight 195 or less take lasix 40 daily ?- For weight 196 and higher take lasix 40 twice a day ?- For weight 199 or higher take metolazone 5 mg then 30 minutes later take lasix 40 mg (once) ?- Ok to keep as 20 mg dosing, take 2 ? ?Samples Given: ?Jardiance 10 mg ?Lot: [id number] ?Exp: 03/2023 ?# 3 bottles ? ?Lot: [id number] ?Exp: 01/2023 ?# 1 bottle  ? ?- Jardiance assistance application provided today ? ?If you need a refill on your cardiac medications before your next appointment, please call your pharmacy.  ? ?Lab work: ?No new labs needed ? ?Testing/Procedures: ?No new testing needed ? ?Follow-Up: ?At [facility], you and your health needs are our priority.  As part of our continuing mission to provide you with exceptional heart care, we have created designated Provider Care Teams.  These Care Teams include your primary Cardiologist (physician) and Advanced Practice Providers (APPs -  Physician Assistants and Nurse Practitioners) who all work together to provide you with the care you need, when you need it. ? ?You will need a follow up appointment in 6 months ? ?Providers on your designated Care Team:   ?Murray Hodgkins, NP ?Christell Faith, PA-C ?Cadence Kathlen Mody, PA-C ? ?COVID-19 Vaccine Information can be found at: ShippingScam.co.uk For questions related to vaccine distribution or appointments, please email [email or web address] .[email or web address] or call [phone].  ? ?

## 2021-11-13 ENCOUNTER — Other Ambulatory Visit: Payer: Self-pay | Admitting: *Deleted

## 2021-11-13 MED ORDER — PREDNISONE 5 MG PO TABS: 10.0000 mg | ORAL_TABLET | Freq: Every day | ORAL | 1 refills | Status: DC

## 2021-11-13 MED ORDER — METOPROLOL SUCCINATE ER 25 MG PO TB24: ORAL_TABLET | ORAL | 3 refills | Status: AC

## 2021-11-14 NOTE — Telephone Encounter (Signed)
Per our [place], [place] letter that was submitted was from 2022 and the new letter from 2023 or 2022 Tax return document would need to be submitted instead.  ? ?Contacted pt's daughter who verified that it was in fact the 2022 [facility] letter. Advised that going through the trouble of obtaining 2023 SSA would be worth the difference of getting the drug for free versus paying the monthly copay. She is agreeable and states she will work towards finding copy of 2023 SSA or obtaining a new one. Provided my direct phone number and requested that she contact me if she runs into any issues or has any additional questions. She verbalized understanding to all.  ? ?Will await f/u. ?

## 2021-11-24 NOTE — Telephone Encounter (Signed)
Received a fax from  [place] regarding an approval for [place] patient assistance from now through 08/26/2022. Approval letter will be sent to scan center. ?

## 2021-11-29 ENCOUNTER — Telehealth: Payer: Self-pay | Admitting: Pulmonary Disease

## 2021-11-29 NOTE — Telephone Encounter (Signed)
Called and spoke to patient.  ?He stated that Judithann Sauger is not affordable. He would like to switch back to Symbicort. ? ?Dr. Patsey Berthold, please advise. Thanks ?

## 2021-11-29 NOTE — Telephone Encounter (Signed)
Spoke to patient and relayed below message.  ?He would like to apply for patient assistance.  ?Application has been printed and placed up front for pickup. ?Nothing further needed at this time.  ? ?

## 2021-11-29 NOTE — Telephone Encounter (Signed)
He should not wean prednisone until he has been on Dupixent for a few months then we can reassess him.  He will be weaned off slowly. ?

## 2021-11-29 NOTE — Telephone Encounter (Signed)
Spoke to patient's daughter, Cindy(DPR). ?[name] stated that patient received Dupixent shipment at home.  Patient has not been instructed on usage of medication.  ?She is also wanting to know how patient should go about weaning prednisone after starting [place].  ? ?Pharmacy team, can you guys assist with this? ? ?Dr. Patsey Berthold, please advise on prednisone. Thanks ? ? ?

## 2021-11-29 NOTE — Telephone Encounter (Signed)
Lm for Julian Sparks(DPR) ?

## 2021-11-29 NOTE — Telephone Encounter (Signed)
Julian Sparks(DPR) is aware of below message and voiced her understanding.  ?She stated that patient is now scheduled 12/01/2021 with [place] office for first dupixent injection.  ?Nothing further needed.  ? ?

## 2021-11-30 ENCOUNTER — Telehealth: Payer: Self-pay

## 2021-11-30 NOTE — Telephone Encounter (Signed)
Patient scheduled for Dupixent new start on 12/01/21. ? ?Knox Saliva, PharmD, MPH, BCPS ?Clinical Pharmacist (Rheumatology and Pulmonology) ?

## 2021-11-30 NOTE — Telephone Encounter (Signed)
Shoes Ordered - [employer] Men - Athletic Strap - Black [id number] 9W ?

## 2021-12-01 ENCOUNTER — Telehealth: Payer: Self-pay

## 2021-12-01 ENCOUNTER — Telehealth: Payer: Self-pay | Admitting: Cardiovascular Disease

## 2021-12-01 ENCOUNTER — Ambulatory Visit (INDEPENDENT_AMBULATORY_CARE_PROVIDER_SITE_OTHER): Payer: Medicare HMO | Admitting: Pharmacist

## 2021-12-01 ENCOUNTER — Other Ambulatory Visit (HOSPITAL_COMMUNITY): Payer: Self-pay

## 2021-12-01 DIAGNOSIS — Z7189 Other specified counseling: Secondary | ICD-10-CM

## 2021-12-01 DIAGNOSIS — J4550 Severe persistent asthma, uncomplicated: Secondary | ICD-10-CM

## 2021-12-01 MED ORDER — DUPIXENT 300 MG/2ML ~~LOC~~ SOAJ: SUBCUTANEOUS | 0 refills | Status: DC

## 2021-12-01 MED ORDER — ALBUTEROL SULFATE HFA 108 (90 BASE) MCG/ACT IN AERS: 2.0000 | INHALATION_SPRAY | RESPIRATORY_TRACT | 1 refills | Status: DC | PRN

## 2021-12-01 NOTE — Telephone Encounter (Signed)
Patient provided with [facility] patient assistance application at [place] new start visit at [facility] location. His granddaughter will bring by provider form and his completed form for Dr. Patsey Berthold to sign. ? ?Knox Saliva, PharmD, MPH, BCPS ?Clinical Pharmacist (Rheumatology and Pulmonology) ?

## 2021-12-01 NOTE — Progress Notes (Signed)
? ?HPI ?Patient presents today to [facility] with his daughter, [name], and granddaughter to see pharmacy team for [place] new start. He has had multiple exacerbations requiring prednisone in the past year. Past medical history includes OSA, chronic respiratory failure (O2 dependent), COPD, T1.5DM managed as T2DM, CHF, HTN. Patient reports that he has eczema though he does not and has nt previously seen dermatologist. ? ?He is currently oral corticosteroid dependent - currently takes 16m daily. ? ?Respiratory Medications ?Current regimen: Breztri (2 puffs twice daily) ?Patient reports adherence challenges - cost. BJudithann Saugeris unaffordable. ? ?OBJECTIVE ?Allergies  ?Allergen Reactions  ? Aspirin Nausea Only and Other (See Comments)  ?  325 mg aspirin cause stomach upset  ? Penicillins Rash  ? ? ?Outpatient Encounter Medications as of 12/01/2021  ?Medication Sig Note  ? ACCU-CHEK AVIVA PLUS test strip TEST BLOOD SUGAR TWICE DAILY   ? Accu-Chek Softclix Lancets lancets CHECK BLOOD SUGAR TWO TIMES DAILY   ? albuterol (PROVENTIL) (2.5 MG/3ML) 0.083% nebulizer solution Take 3 mLs (2.5 mg total) by nebulization every 6 (six) hours as needed for wheezing or shortness of breath.   ? albuterol (VENTOLIN HFA) 108 (90 Base) MCG/ACT inhaler Inhale 2 puffs into the lungs as needed for wheezing or shortness of breath.   ? Alcohol Swabs (B-D SINGLE USE SWABS REGULAR) PADS USE AS DIRECTED   ? aspirin 81 MG EC tablet Take 81 mg by mouth daily. Swallow whole.    ? atorvastatin (LIPITOR) 40 MG tablet TAKE 1 TABLET EVERY DAY   ? B Complex-C (SUPER B COMPLEX PO) Take 1 tablet by mouth daily.    ? Blood Glucose Monitoring Suppl (ACCU-CHEK AVIVA PLUS) w/Device KIT Check blood sugar BID   ? Budeson-Glycopyrrol-Formoterol (BREZTRI AEROSPHERE) 160-9-4.8 MCG/ACT AERO Inhale 2 puffs into the lungs 2 (two) times daily.   ? Dupilumab (DUPIXENT) 300 MG/2ML SOPN Inject 6072minto the skin at Week 0, then 30073mvery 14 days. (Patient not  taking: Reported on 11/09/2021) 11/09/2021: Hasn't started yet  ? empagliflozin (JARDIANCE) 10 MG TABS tablet TAKE 1 TABLET(10 MG) BY MOUTH DAILY   ? fluticasone (FLONASE) 50 MCG/ACT nasal spray Place 1 spray into both nostrils 2 (two) times a day.  (Patient not taking: Reported on 11/09/2021)   ? furosemide (LASIX) 20 MG tablet For weight 197 or less take lasix 40 daily. For weight 198 and higher take lasix 40 twice a day. For weight 201 or higher take metolazone 5 mg then 30 minutes later take lasix 40 mg (once)   ? gabapentin (NEURONTIN) 300 MG capsule TAKE 1 CAPSULE THREE TIMES DAILY   ? gentamicin cream (GARAMYCIN) 0.1 % Apply 1 application topically 2 (two) times daily.   ? metFORMIN (GLUCOPHAGE) 500 MG tablet TAKE 1 TABLET TWICE DAILY   ? metolazone (ZAROXOLYN) 5 MG tablet For weight 201 or higher take metolazone 5 mg, then 30 minutes later take lasix 40 mg   ? metoprolol succinate (TOPROL-XL) 25 MG 24 hr tablet TAKE 1 TABLET(25 MG) BY MOUTH DAILY   ? predniSONE (DELTASONE) 5 MG tablet Take 2 tablets (10 mg total) by mouth daily with breakfast.   ? Pseudoeph-Doxylamine-DM-APAP (NYQUIL PO) Take by mouth as needed.   ? rOPINIRole (REQUIP) 0.5 MG tablet TAKE 1 TABLET(0.5 MG) BY MOUTH AT BEDTIME   ? traZODone (DESYREL) 50 MG tablet TAKE 2 TABLETS EVERY DAY   ? ?No facility-administered encounter medications on file as of 12/01/2021.  ?  ? ?Immunization History  ?Administered Date(s) Administered  ?  Fluad Quad(high Dose 65+) 05/11/2020, 05/31/2021  ? Influenza,inj,quad, With Preservative 06/27/2016  ? PFIZER(Purple Top)SARS-COV-2 Vaccination 11/23/2019, 12/16/2019  ? Pneumococcal Conjugate-13 04/16/2014  ? Pneumococcal-Unspecified 05/23/2014  ? Zoster Recombinat (Shingrix) 01/24/2015  ?  ?Eosinophils ?Most recent blood eosinophil count was 300 cells/microL taken on 06/28/21.  ? ?IgE: 507 on 06/28/21 ? ? ?Assessment  ? ?Biologics training for dupilumab (Dupixent) ? ?Goals of therapy: ?Mechanism: human monoclonal IgG4  antibody that inhibits interleukin-4 and interleukin-13 cytokine-induced responses, including release of proinflammatory cytokines, chemokines, and IgE ?Reviewed that Dupixent is add-on medication and patient must continue maintenance inhaler regimen. ?Response to therapy: may take 4 months to determine efficacy. Discussed that patients generally feel improvement sooner than 4 months. ? ?Side effects: injection site reaction (6-18%), antibody development (5-16%), ophthalmic conjunctivitis (2-16%), transient blood eosinophilia (1-2%) ? ?Dose: 675m at Week 0 (administered today in clinic) followed by 3066mevery 14 days thereafter ? ?Administration/Storage:  ?Reviewed administration sites of thigh or abdomen (at least 2-3 inches away from abdomen). Reviewed the upper arm is only appropriate if caregiver is administering injection  ?Do not shake pen/syringe as this could lead to product foaming or precipitation. ?Do not use if solution is discolored or contains particulate matter or if window on prefilled pen is yellow (indicates pen has been used).  ?Reviewed storage of medication in refrigerator. Reviewed that DuCarsonan be stored at room temperature in unopened carton for up to 14 days. ? ?Access: ?Approval of Dupixent through: patient assistance ? ?Patient's daughter administered medication in the back of patient's left and right arm using Dupixent sample ?Dupixent 30033mmL autoinjector pen x 2 pens ?[id number]:[id number]: [id number] ?Exp: 03/27/2023 ? ? ?Medication Reconciliation ? ?A drug regimen assessment was performed, including review of allergies, interactions, disease-state management, dosing and immunization history. Medications were reviewed with the patient, including name, instructions, indication, goals of therapy, potential side effects, importance of adherence, and safe use. ? ?Drug interaction(s): none noted ? ?[facility] since he is in the process of applying for patient  assistance through [place] ?Breztri 160/9/4.8mc71mLot: [id number] ?Exp: 04/2024 ? ?He states he fills Ventolin (brand) through [facility]?I ran a test claim today for generic Ventolin - copay is $28.96 for 3 inhalers. I advised patient that I am unsure if this copay is accurate since it seems as if BrezJudithann Sauger been filled and paid for. Patient advised to fill Ventolin first and then cancel Breztri fill. ? ?PLAN ?Continue Dupixent 300mg59mry 14 days.  Next dose is due 12/15/21 and every 14 days thereafter. Rx sent to: [facility]: [phone] has received shipment for two month supply at home and placed in refrigerator for future doses. ?Continue maintenance asthma regimen of: Breztri (2 puffs twice daily) - sample provided today. He will continue prednisone 20mg 53my until seen by Dr. GonzalPatsey Bertholdu to develop steroid weaning plan. ?Patient assistance application for BreztrJudithann Saugerded to patient today. His granddaughter will drop off application to [place] today for Dr. GonzalPatsey Bertholdgn ?He was also provided with printed rx for Ventolin rx in case copay through pharmacy is expensive for patient ? ?All questions encouraged and answered.  Instructed patient to reach out with any further questions or concerns. ? ?Thank you for allowing pharmacy to participate in this patient's care. ? ?This appointment required 60 minutes of patient care (this includes precharting, chart review, review of results, face-to-face care, etc.).  ? ?Lindon Kiel Knox SalivamD, MPH, BCPS ?Clinical Pharmacist (Rheumatology and  Pulmonology) ?

## 2021-12-01 NOTE — Telephone Encounter (Signed)
Noted by triage.  ?Will await application.  ?

## 2021-12-01 NOTE — Telephone Encounter (Signed)
Application complete and signed by patient.  ? ?Prescribed portion complete and signed by Dr. Rockey Situ.  ? ?Med list, allergy list, and prescription attached.  ? ?Faxed to [employer] Patient Assistance Program.  ? ?Application placed in filing cabinet in medication room.  ?

## 2021-12-01 NOTE — Telephone Encounter (Signed)
Patient dropped PAF placed in box. ?

## 2021-12-01 NOTE — Patient Instructions (Signed)
Your next [place] dose is due on 12/15/21, 12/29/21, and every 14 days thereafter ? ?CONTINUE BREZTRI ? ?Drop off the application to Dr. Domingo Dimes office  ? ?Your prescription will be shipped from [facility]. Their phone number is [phone] ?Please call to schedule shipment and confirm address once you're down to one pen. They will mail your medication to your home. ? ?You will need to be seen by your provider in 3 to 4 months to assess how [place] is working for you. Please ensure you have a follow-up appointment scheduled in July or August 2023. Call our clinic if you need to make this appointment. ? ?How to manage an injection site reaction: ?Remember the 5 C's: ?COUNTER - leave on the counter at least 30 minutes but up to overnight to bring medication to room temperature. This may help prevent stinging ?COLD - place something cold (like an ice gel pack or cold water bottle) on the injection site just before cleansing with alcohol. This may help reduce pain ?CLARITIN - use Claritin (generic name is loratadine) for the first two weeks of treatment or the day of, the day before, and the day after injecting. This will help to minimize injection site reactions ?CORTISONE CREAM - apply if injection site is irritated and itching ?CALL ME - if injection site reaction is bigger than the size of your fist, looks infected, blisters, or if you develop hives  ?

## 2021-12-01 NOTE — Telephone Encounter (Signed)
Application received and placed in Dr. Domingo Dimes folder for signature.  ?

## 2021-12-01 NOTE — Telephone Encounter (Signed)
-----   Message from [facility] sent at 12/01/2021  9:12 AM EDT ----- ?Julian Sparks patient started on [place] today. Tolerated without issue. His granddaughter will be dropping off rx for Ventolin as well as AZ&Me application for Breztri ?

## 2021-12-05 ENCOUNTER — Other Ambulatory Visit: Payer: Self-pay | Admitting: *Deleted

## 2021-12-05 ENCOUNTER — Telehealth: Payer: Self-pay | Admitting: *Deleted

## 2021-12-05 MED ORDER — BREZTRI AEROSPHERE 160-9-4.8 MCG/ACT IN AERO: 2.0000 | INHALATION_SPRAY | Freq: Two times a day (BID) | RESPIRATORY_TRACT | 3 refills | Status: AC

## 2021-12-05 NOTE — Telephone Encounter (Signed)
Patient assistance forms (patient forms and provider forms along with prescription faxed to [place] [facility] [place]).  Received confirmation that fax was sent successfully.  Will leave open until it is determined if patient qualifies. ?

## 2021-12-05 NOTE — Telephone Encounter (Signed)
Incoming fax from [facility].  ? ?We are pleased to inform you that your patient's enrollment in the [place] Patient Assistance Program has been approved. Your patient is eligible to receive medication through the program from 12/01/21 through 08/26/22, unless their circumstances change.  ? ?Fax placed in filing cabinet in the medication room.  ?

## 2021-12-06 ENCOUNTER — Ambulatory Visit (INDEPENDENT_AMBULATORY_CARE_PROVIDER_SITE_OTHER): Payer: Medicare HMO | Admitting: Internal Medicine

## 2021-12-06 ENCOUNTER — Encounter: Payer: Self-pay | Admitting: Internal Medicine

## 2021-12-06 VITALS — BP 150/77 | HR 77 | Ht 66.0 in | Wt 196.4 lb

## 2021-12-06 DIAGNOSIS — E669 Obesity, unspecified: Secondary | ICD-10-CM | POA: Diagnosis not present

## 2021-12-06 DIAGNOSIS — J4550 Severe persistent asthma, uncomplicated: Secondary | ICD-10-CM | POA: Diagnosis not present

## 2021-12-06 DIAGNOSIS — G4733 Obstructive sleep apnea (adult) (pediatric): Secondary | ICD-10-CM | POA: Diagnosis not present

## 2021-12-06 DIAGNOSIS — I10 Essential (primary) hypertension: Secondary | ICD-10-CM | POA: Diagnosis not present

## 2021-12-06 DIAGNOSIS — E1169 Type 2 diabetes mellitus with other specified complication: Secondary | ICD-10-CM | POA: Diagnosis not present

## 2021-12-06 DIAGNOSIS — E139 Other specified diabetes mellitus without complications: Secondary | ICD-10-CM | POA: Diagnosis not present

## 2021-12-06 LAB — POCT GLYCOSYLATED HEMOGLOBIN (HGB A1C): Hemoglobin A1C: 8.2 % — AB (ref 4.0–5.6)

## 2021-12-06 LAB — GLUCOSE, POCT (MANUAL RESULT ENTRY): POC Glucose: 212 mg/dl — AB (ref 70–99)

## 2021-12-06 MED ORDER — ALBUTEROL SULFATE HFA 108 (90 BASE) MCG/ACT IN AERS: 2.0000 | INHALATION_SPRAY | Freq: Four times a day (QID) | RESPIRATORY_TRACT | 3 refills | Status: AC | PRN

## 2021-12-06 NOTE — Assessment & Plan Note (Signed)
Stable

## 2021-12-06 NOTE — Assessment & Plan Note (Signed)

## 2021-12-06 NOTE — Assessment & Plan Note (Signed)

## 2021-12-06 NOTE — Progress Notes (Addendum)
Established Patient Office Visit  Subjective:  Patient ID: Julian Sparks, male    DOB: 1946/08/12  Age: 76 y.o. MRN: [id number]  CC:  Chief Complaint  Patient presents with   Diabetes    Patient is here for routine diabetic check    Diabetes   Julian Sparks presents for general checkup.  He denies any chest pain or shortness of breath, he is on oxygen 2 L/min, his wheezing is under control  Past Medical History:  Diagnosis Date   CHF (congestive heart failure) (HCC)    Chronic bronchitis (HCC)    COPD (chronic obstructive pulmonary disease) (HCC)    Diabetes mellitus without complication (HCC)    Diastolic dysfunction    Dyspnea    with exertion   Hypertension     Past Surgical History:  Procedure Laterality Date   CIRCUMCISION     SHOULDER ARTHROSCOPY WITH OPEN ROTATOR CUFF REPAIR Left 06/20/2016   Procedure: SHOULDER ARTHROSCOPY WITH OPEN ROTATOR CUFF REPAIR;  Surgeon: Deeann Saint, MD;  Location: [facility] ORS;  Service: Orthopedics;  Laterality: Left;   UPPER GI ENDOSCOPY      Family History  Problem Relation Age of Onset   Hypertension Mother    Cancer Sister    Diabetes Brother     Social History   Socioeconomic History   Marital status: Widowed    Spouse name: Not on file   Number of children: 2   Years of education: 9   Highest education level: Some college, no degree  Occupational History   Not on file  Tobacco Use   Smoking status: Former    Packs/day: 2.00    Years: 53.00    Pack years: 106.00    Types: Cigarettes    Quit date: 2014    Years since quitting: 9.2   Smokeless tobacco: Former    Types: Associate Professor Use: Never used  Substance and Sexual Activity   Alcohol use: No   Drug use: No   Sexual activity: Not Currently  Other Topics Concern   Not on file  Social History Narrative   Not on file   Social Determinants of Health   Financial Resource Strain: Low Risk    Difficulty of Paying Living Expenses: Not hard at  all  Food Insecurity: No Food Insecurity   Worried About Programme researcher, broadcasting/film/video in the Last Year: Never true   Barista in the Last Year: Never true  Transportation Needs: No Transportation Needs   Lack of Transportation (Medical): No   Lack of Transportation (Non-Medical): No  Physical Activity: Sufficiently Active   Days of Exercise per Week: 7 days   Minutes of Exercise per Session: 40 min  Stress: No Stress Concern Present   Feeling of Stress : Not at all  Social Connections: Socially Isolated   Frequency of Communication with Friends and Family: More than three times a week   Frequency of Social Gatherings with Friends and Family: More than three times a week   Attends Religious Services: Never   Database administrator or Organizations: No   Attends Banker Meetings: Never   Marital Status: Widowed  Catering manager Violence: Not At Risk   Fear of Current or Ex-Partner: No   Emotionally Abused: No   Physically Abused: No   Sexually Abused: No     Current Outpatient Medications:    ACCU-CHEK AVIVA PLUS test strip, TEST BLOOD SUGAR TWICE  DAILY, Disp: 300 strip, Rfl: 3   Accu-Chek Softclix Lancets lancets, CHECK BLOOD SUGAR TWO TIMES DAILY, Disp: 200 each, Rfl: 4   albuterol (PROVENTIL) (2.5 MG/3ML) 0.083% nebulizer solution, Take 3 mLs (2.5 mg total) by nebulization every 6 (six) hours as needed for wheezing or shortness of breath., Disp: 216 mL, Rfl: 3   albuterol (VENTOLIN HFA) 108 (90 Base) MCG/ACT inhaler, Inhale 2 puffs into the lungs as needed for wheezing or shortness of breath., Disp: 54 g, Rfl: 1   albuterol (VENTOLIN HFA) 108 (90 Base) MCG/ACT inhaler, Inhale 2 puffs into the lungs every 6 (six) hours as needed for wheezing or shortness of breath., Disp: 18 g, Rfl: 3   Alcohol Swabs (B-D SINGLE USE SWABS REGULAR) PADS, USE AS DIRECTED, Disp: 300 each, Rfl: 3   aspirin 81 MG EC tablet, Take 81 mg by mouth daily. Swallow whole. , Disp: , Rfl:     atorvastatin (LIPITOR) 40 MG tablet, TAKE 1 TABLET EVERY DAY, Disp: 90 tablet, Rfl: 3   B Complex-C (SUPER B COMPLEX PO), Take 1 tablet by mouth daily. , Disp: , Rfl:    Blood Glucose Monitoring Suppl (ACCU-CHEK AVIVA PLUS) w/Device KIT, Check blood sugar BID, Disp: 1 kit, Rfl: 3   Budeson-Glycopyrrol-Formoterol (BREZTRI AEROSPHERE) 160-9-4.8 MCG/ACT AERO, Inhale 2 puffs into the lungs in the morning and at bedtime., Disp: 32.1 g, Rfl: 3   Dupilumab (DUPIXENT) 300 MG/2ML SOPN, Inject 600mg  into the skin at Week 0, then 300mg  every 14 days., Disp: 12 mL, Rfl: 0   empagliflozin (JARDIANCE) 10 MG TABS tablet, TAKE 1 TABLET(10 MG) BY MOUTH DAILY, Disp: 90 tablet, Rfl: 3   gabapentin (NEURONTIN) 300 MG capsule, TAKE 1 CAPSULE THREE TIMES DAILY, Disp: 270 capsule, Rfl: 3   metFORMIN (GLUCOPHAGE) 500 MG tablet, TAKE 1 TABLET TWICE DAILY, Disp: 180 tablet, Rfl: 3   metolazone (ZAROXOLYN) 5 MG tablet, For weight 201 or higher take metolazone 5 mg, then 30 minutes later take lasix 40 mg, Disp: 90 tablet, Rfl: 0   metoprolol succinate (TOPROL-XL) 25 MG 24 hr tablet, TAKE 1 TABLET(25 MG) BY MOUTH DAILY, Disp: 90 tablet, Rfl: 3   predniSONE (DELTASONE) 5 MG tablet, Take 2 tablets (10 mg total) by mouth daily with breakfast., Disp: 60 tablet, Rfl: 1   Pseudoeph-Doxylamine-DM-APAP (NYQUIL PO), Take by mouth as needed., Disp: , Rfl:    rOPINIRole (REQUIP) 0.5 MG tablet, TAKE 1 TABLET(0.5 MG) BY MOUTH AT BEDTIME, Disp: 90 tablet, Rfl: 0   traZODone (DESYREL) 50 MG tablet, TAKE 2 TABLETS EVERY DAY, Disp: 180 tablet, Rfl: 3   furosemide (LASIX) 20 MG tablet, For weight 197 or less take lasix 40 daily. For weight 198 and higher take lasix 40 twice a day. For weight 201 or higher take metolazone 5 mg then 30 minutes later take lasix 40 mg (once), Disp: 200 tablet, Rfl: 3   Allergies  Allergen Reactions   Aspirin Nausea Only and Other (See Comments)    325 mg aspirin cause stomach upset   Penicillins Rash     ROS Review of Systems  Constitutional: Negative.   HENT: Negative.    Eyes: Negative.   Respiratory: Negative.    Cardiovascular: Negative.   Gastrointestinal: Negative.   Endocrine: Negative.   Genitourinary: Negative.   Musculoskeletal: Negative.   Skin: Negative.   Allergic/Immunologic: Negative.   Neurological: Negative.   Hematological: Negative.   Psychiatric/Behavioral: Negative.    All other systems reviewed and are negative.  Objective:    Physical Exam Vitals reviewed.  Constitutional:      Appearance: Normal appearance.  HENT:     Mouth/Throat:     Mouth: Mucous membranes are moist.  Eyes:     Pupils: Pupils are equal, round, and reactive to light.  Neck:     Vascular: No carotid bruit.  Cardiovascular:     Rate and Rhythm: Normal rate and regular rhythm.     Pulses: Normal pulses.     Heart sounds: Normal heart sounds.  Pulmonary:     Effort: Pulmonary effort is normal.     Breath sounds: Normal breath sounds.  Abdominal:     General: Bowel sounds are normal.     Palpations: Abdomen is soft. There is no hepatomegaly, splenomegaly or mass.     Tenderness: There is no abdominal tenderness.     Hernia: No hernia is present.  Musculoskeletal:     Cervical back: Neck supple.     Right lower leg: No edema.     Left lower leg: No edema.  Skin:    Findings: No rash.  Neurological:     Mental Status: He is alert and oriented to person, place, and time.     Motor: No weakness.  Psychiatric:        Mood and Affect: Mood normal.        Behavior: Behavior normal.    BP (!) 150/77   Pulse 77   Ht 5\' 6"  (1.676 m)   Wt 196 lb 6.4 oz (89.1 kg)   BMI 31.70 kg/m  Wt Readings from Last 3 Encounters:  12/06/21 196 lb 6.4 oz (89.1 kg)  11/10/21 198 lb 2 oz (89.9 kg)  11/09/21 198 lb 8 oz (90 kg)     Health Maintenance Due  Topic Date Due   FOOT EXAM  Never done   OPHTHALMOLOGY EXAM  Never done   URINE MICROALBUMIN  Never done   Hepatitis C  Screening  Never done   TETANUS/TDAP  Never done   Zoster Vaccines- Shingrix (2 of 2) 03/21/2015   COVID-19 Vaccine (3 - Pfizer risk series) 01/13/2020    There are no preventive care reminders to display for this patient.  Lab Results  Component Value Date   TSH 1.152 01/24/2021   Lab Results  Component Value Date   WBC 10.3 09/21/2021   HGB 14.0 09/21/2021   HCT 45.1 09/21/2021   MCV 97.0 09/21/2021   PLT 182 09/21/2021   Lab Results  Component Value Date   NA 138 09/21/2021   K 4.9 09/21/2021   CO2 34 (H) 09/21/2021   GLUCOSE 361 (H) 09/21/2021   BUN 30 (H) 09/21/2021   CREATININE 1.00 09/21/2021   BILITOT 1.0 04/13/2021   ALKPHOS 74 04/13/2021   AST 18 04/13/2021   ALT 16 04/13/2021   PROT 7.3 04/13/2021   ALBUMIN 3.8 04/13/2021   CALCIUM 8.8 (L) 09/21/2021   ANIONGAP 10 09/21/2021   Lab Results  Component Value Date   CHOL 101 01/26/2021   Lab Results  Component Value Date   HDL 46 01/26/2021   Lab Results  Component Value Date   LDLCALC 36 01/26/2021   Lab Results  Component Value Date   TRIG 97 01/26/2021   Lab Results  Component Value Date   CHOLHDL 2.2 01/26/2021   Lab Results  Component Value Date   HGBA1C 8.2 (A) 12/06/2021        Meds ordered this encounter  Medications  albuterol (VENTOLIN HFA) 108 (90 Base) MCG/ACT inhaler    Sig: Inhale 2 puffs into the lungs every 6 (six) hours as needed for wheezing or shortness of breath.    Dispense:  18 g    Refill:  3  Patient blood sugar was elevated today, he needs diabetic shoes, he has decreased sensation in both feet, pulses are also decreased in the both feet. Recommendation Diabetic shoes are recommended for this patient to prevent any ulceration of the feet  Follow-up: No follow-ups on file.    Corky Downs, MD

## 2021-12-06 NOTE — Assessment & Plan Note (Signed)

## 2021-12-08 NOTE — Telephone Encounter (Signed)
Received fax stating patient has been approved for patient assistance from AZ&ME from 12/06/2021-08/26/2022.  I let him know he would receive something directly from AZ&ME as well and the medication would be shipped directly to his home.  He verbalized understanding.  Nothing further needed. ?

## 2021-12-11 DIAGNOSIS — H5203 Hypermetropia, bilateral: Secondary | ICD-10-CM | POA: Diagnosis not present

## 2021-12-11 DIAGNOSIS — Z0100 Encounter for examination of eyes and vision without abnormal findings: Secondary | ICD-10-CM | POA: Diagnosis not present

## 2021-12-12 ENCOUNTER — Other Ambulatory Visit: Payer: Self-pay | Admitting: *Deleted

## 2021-12-13 DIAGNOSIS — E119 Type 2 diabetes mellitus without complications: Secondary | ICD-10-CM | POA: Diagnosis not present

## 2021-12-22 ENCOUNTER — Ambulatory Visit: Payer: Medicare HMO | Admitting: Pulmonary Disease

## 2021-12-22 ENCOUNTER — Encounter: Payer: Self-pay | Admitting: Pulmonary Disease

## 2021-12-22 VITALS — BP 122/68 | HR 82 | Temp 97.7°F | Ht 66.0 in | Wt 200.6 lb

## 2021-12-22 DIAGNOSIS — J4550 Severe persistent asthma, uncomplicated: Secondary | ICD-10-CM

## 2021-12-22 DIAGNOSIS — J449 Chronic obstructive pulmonary disease, unspecified: Secondary | ICD-10-CM

## 2021-12-22 DIAGNOSIS — J9612 Chronic respiratory failure with hypercapnia: Secondary | ICD-10-CM | POA: Diagnosis not present

## 2021-12-22 DIAGNOSIS — G2581 Restless legs syndrome: Secondary | ICD-10-CM | POA: Diagnosis not present

## 2021-12-22 DIAGNOSIS — J9611 Chronic respiratory failure with hypoxia: Secondary | ICD-10-CM

## 2021-12-22 MED ORDER — ROPINIROLE HCL 0.5 MG PO TABS: ORAL_TABLET | ORAL | 3 refills | Status: DC

## 2021-12-22 MED ORDER — PREDNISONE 5 MG PO TABS: 7.5000 mg | ORAL_TABLET | Freq: Every day | ORAL | 1 refills | Status: DC

## 2021-12-22 NOTE — Progress Notes (Signed)
? ?Subjective:  ? ? Patient ID: Julian Sparks, male    DOB: [birth year], 76 y.o.   MRN: [id number] ?Patient Care Team: ?[name], MD as PCP - General (Internal Medicine) ?Minna Merritts, MD as PCP - Cardiology (Cardiology) ?Sindy Guadeloupe, MD as Consulting Physician (Hematology and Oncology) ? ?Chief Complaint  ?Patient presents with  ? Follow-up  ?  SOb with exertion, prod cough with grey sputum and occ wheezing.   ? ? ?HPI ?Patient is a 76 year old former smoker (106 PY) who presents for scheduled follow-up on severe COPD and chronic respiratory failure with hypoxia.  He was last evaluated on 12 October 2021. He is chronically on 2 to 4 L of oxygen during the day and uses a Trilogy ventilator at night. He has very prominent asthmatic component. He is currently on prednisone 10 mg as well as Breztri and as needed albuterol.  Since his last visit he has started Dupixent and actually is on his second dose.  He already notes improvement on this.  Prior to starting Dupixent we had had to increase his prednisone to 10 mg as noted above due to increased shortness of breath.  The plan is to begin weaning him off now that he has started Dupixent.  At his prior visit we also started Requip for his restless legs.  Since starting that medication has been able to get restorative sleep and does not complain of "runny legs".  Today he states that he feels like he is doing well, he actually he is not using accessories of respiration, is jovial and conversant.  No other symptomatology endorsed. ? ?DATA: ?04/16/2014 PFT's [facility]): FEV1 1.02 L or 35% predicted.  FVC reported at 67.4% predicted.  Small airways component FEF 25-75% at 13.9%.  DLCO 59%.  These are the only numbers available on record ?01/24/2021 echocardiogram: LVEF 50 to 55%, hypokinesis of septal wall, grade 2 DD, RV function moderately reduced, RV size enlarged.  Right atrial size dilated.  No valvular abnormalities ?04/06/2021 sleep study: Obstructive sleep  apnea AHI 20.8, baseline oxygen saturation remained below 90% even with supplemental oxygen ?05/09/2021 hypersensitivity pneumonitis (bird [employer]iers) panel: Normal ?06/28/2021, allergen panel/IgE: IgE 507, class II sensitivities to dust mites, molds and trees. ?07/28/2021 PFTs: FEV1 0.75 L or 29% predicted, FVC 1.67 L or 47% predicted, FEV1/FVC 45%.  No significant bronchodilator response lung volumes show significant air trapping.  There is small airways component.  Diffusion capacity severely reduced.  This represents a decline from his 53 PFTs ? ?Review of Systems ?A 10 point review of systems was performed and it is as noted above otherwise negative. ? ?Patient Active Problem List  ? Diagnosis Date Noted  ? Pneumonia of both lungs due to infectious organism   ? COPD with acute exacerbation ([place])   ? OSA (obstructive sleep apnea) 04/11/2021  ? Ulcer of left foot ([place]) 03/08/2021  ? Paroxysmal atrial tachycardia ([place])   ? Metabolic alkalosis 28/36/6294  ? Acute metabolic encephalopathy 76/54/6503  ? Lymphoma, small lymphocytic ([place]) 02/04/2021  ? Acute cor pulmonale (HCC)   ? Acute on chronic congestive heart failure ([place])   ? Acute on chronic diastolic CHF (congestive heart failure) ([place]) 01/24/2021  ? Type 2 diabetes mellitus with hyperlipidemia ([place]) 01/24/2021  ? Acute on chronic respiratory failure ([place]) 01/24/2021  ? Hyperkalemia 01/24/2021  ? Obesity (BMI 30-39.9) 01/24/2021  ? Shortness of breath 10/26/2020  ? Essential hypertension 07/13/2020  ? Cellulitis of right lower extremity 07/13/2020  ? Need  for influenza vaccination 05/11/2020  ? Chronic respiratory failure with hypoxia ([place]) 04/27/2020  ? Bronchitis 04/27/2020  ? Pleurodynia 04/27/2020  ? Obesity (BMI 30.0-34.9) 02/17/2020  ? Diabetes 1.5, managed as type 2 ([place]) 02/17/2020  ? Adhesive capsulitis of both shoulders 02/17/2020  ? Edema 07/19/2019  ? Leg swelling 07/19/2019  ? Diminished pulses in lower extremity 07/19/2019  ? Bursitis of shoulder  04/03/2019  ? Respiratory failure ([place]) 02/04/2019  ? Full thickness rotator cuff tear 05/23/2016  ? ?Social History  ? ?Tobacco Use  ? Smoking status: Former  ?  Packs/day: 2.00  ?  Years: 53.00  ?  Pack years: 106.00  ?  Types: Cigarettes  ?  Quit date: 2014  ?  Years since quitting: 9.3  ? Smokeless tobacco: Former  ?  Types: Chew  ?Substance Use Topics  ? Alcohol use: No  ? ?Allergies  ?Allergen Reactions  ? Aspirin Nausea Only and Other (See Comments)  ?  325 mg aspirin cause stomach upset  ? Penicillins Rash  ? ?Current Meds  ?Medication Sig  ? ACCU-CHEK AVIVA PLUS test strip TEST BLOOD SUGAR TWICE DAILY  ? Accu-Chek Softclix Lancets lancets CHECK BLOOD SUGAR TWO TIMES DAILY  ? albuterol (PROVENTIL) (2.5 MG/3ML) 0.083% nebulizer solution Take 3 mLs (2.5 mg total) by nebulization every 6 (six) hours as needed for wheezing or shortness of breath.  ? albuterol (VENTOLIN HFA) 108 (90 Base) MCG/ACT inhaler Inhale 2 puffs into the lungs every 6 (six) hours as needed for wheezing or shortness of breath.  ? Alcohol Swabs (B-D SINGLE USE SWABS REGULAR) PADS USE AS DIRECTED  ? aspirin 81 MG EC tablet Take 81 mg by mouth daily. Swallow whole.   ? atorvastatin (LIPITOR) 40 MG tablet TAKE 1 TABLET EVERY DAY  ? B Complex-C (SUPER B COMPLEX PO) Take 1 tablet by mouth daily.   ? Blood Glucose Monitoring Suppl (ACCU-CHEK AVIVA PLUS) w/Device KIT Check blood sugar BID  ? Budeson-Glycopyrrol-Formoterol (BREZTRI AEROSPHERE) 160-9-4.8 MCG/ACT AERO Inhale 2 puffs into the lungs in the morning and at bedtime.  ? Dupilumab (DUPIXENT) 300 MG/2ML SOPN Inject 668m into the skin at Week 0, then 3034mevery 14 days.  ? empagliflozin (JARDIANCE) 10 MG TABS tablet TAKE 1 TABLET(10 MG) BY MOUTH DAILY  ? furosemide (LASIX) 20 MG tablet For weight 197 or less take lasix 40 daily. For weight 198 and higher take lasix 40 twice a day. For weight 201 or higher take metolazone 5 mg then 30 minutes later take lasix 40 mg (once)  ? gabapentin  (NEURONTIN) 300 MG capsule TAKE 1 CAPSULE THREE TIMES DAILY  ? metFORMIN (GLUCOPHAGE) 500 MG tablet TAKE 1 TABLET TWICE DAILY  ? metolazone (ZAROXOLYN) 5 MG tablet For weight 201 or higher take metolazone 5 mg, then 30 minutes later take lasix 40 mg  ? metoprolol succinate (TOPROL-XL) 25 MG 24 hr tablet TAKE 1 TABLET(25 MG) BY MOUTH DAILY  ? predniSONE (DELTASONE) 5 MG tablet Take 2 tablets (10 mg total) by mouth daily with breakfast.  ? Pseudoeph-Doxylamine-DM-APAP (NYQUIL PO) Take by mouth as needed.  ? rOPINIRole (REQUIP) 0.5 MG tablet TAKE 1 TABLET(0.5 MG) BY MOUTH AT BEDTIME  ? traZODone (DESYREL) 50 MG tablet TAKE 2 TABLETS EVERY DAY  ? ?Immunization History  ?Administered Date(s) Administered  ? Fluad Quad(high Dose 65+) 05/11/2020, 05/31/2021  ? Influenza,inj,quad, With Preservative 06/27/2016  ? PFIZER(Purple Top)SARS-COV-2 Vaccination 11/23/2019, 12/16/2019  ? Pneumococcal Conjugate-13 04/16/2014  ? Pneumococcal-Unspecified 05/23/2014  ? Zoster Recombinat (  Shingrix) 01/24/2015  ? ?   ?Objective:  ? Physical Exam ?BP 122/68 (BP Location: Left Arm, Cuff Size: Large)   Pulse 82   Temp 97.7 ?F (36.5 ?C) (Temporal)   Ht 5' 6" (1.676 m)   Wt 200 lb 9.6 oz (91 kg)   SpO2 90%   BMI 32.38 kg/m?  ?GENERAL: Overweight gentleman, no acute distress.  Looks refreshed.  Presents in transport chair. Comfortable on 2 l/min nasal cannula O2 saturating 94%.  No use of accessories. ?HEAD: Normocephalic, atraumatic.  ?EYES: Pupils equal, round, reactive to light.  No scleral icterus.  ?MOUTH: Oral mucosa moist, no thrush. ?NECK: Supple. No thyromegaly. Trachea midline. No JVD.  No adenopathy. ?PULMONARY: Good air entry bilaterally.  Coarse breath sounds throughout otherwise no adventitious sounds. ?CARDIOVASCULAR: S1 and S2. Regular rate and rhythm.  No rubs, murmurs or gallops heard.   ?ABDOMEN: Protuberant, soft, benign. ?MUSCULOSKELETAL: No joint deformity, no clubbing, no edema.  ?NEUROLOGIC: No overt focal  deficit. ?SKIN: Intact,warm,dry.  Psoriatic changes vs. onychomycosis on the nails on left hand. ?PSYCH: Mood and behavior normal.  He is actually very conversant and jovial today. ?  ?   ?Assessment & Plan:  ? ?  ICD-10-C

## 2021-12-22 NOTE — Patient Instructions (Signed)
Continue your Dupixent shots. ? ?Continue your Breztri and Requip. ? ?Your new dose for the prednisone is 1-1/2 tablets which equals 7.5 mg.  You will take this dose until we see you again.  We will start reducing it further once we reevaluate you. ? ?We will see you in follow-up in 2 to 3 months time call sooner should any new problems arise. ?

## 2021-12-25 ENCOUNTER — Other Ambulatory Visit: Payer: Self-pay | Admitting: Internal Medicine

## 2021-12-25 DIAGNOSIS — R609 Edema, unspecified: Secondary | ICD-10-CM

## 2021-12-25 LAB — HM DIABETES EYE EXAM

## 2021-12-29 ENCOUNTER — Ambulatory Visit (INDEPENDENT_AMBULATORY_CARE_PROVIDER_SITE_OTHER): Payer: Medicare HMO

## 2021-12-29 ENCOUNTER — Encounter: Payer: Self-pay | Admitting: Podiatry

## 2021-12-29 ENCOUNTER — Ambulatory Visit: Payer: Medicare HMO | Admitting: Podiatry

## 2021-12-29 DIAGNOSIS — E1169 Type 2 diabetes mellitus with other specified complication: Secondary | ICD-10-CM | POA: Diagnosis not present

## 2021-12-29 DIAGNOSIS — M79674 Pain in right toe(s): Secondary | ICD-10-CM

## 2021-12-29 DIAGNOSIS — M79675 Pain in left toe(s): Secondary | ICD-10-CM | POA: Diagnosis not present

## 2021-12-29 DIAGNOSIS — I739 Peripheral vascular disease, unspecified: Secondary | ICD-10-CM

## 2021-12-29 DIAGNOSIS — E785 Hyperlipidemia, unspecified: Secondary | ICD-10-CM

## 2021-12-29 DIAGNOSIS — L989 Disorder of the skin and subcutaneous tissue, unspecified: Secondary | ICD-10-CM | POA: Diagnosis not present

## 2021-12-29 DIAGNOSIS — E08621 Diabetes mellitus due to underlying condition with foot ulcer: Secondary | ICD-10-CM

## 2021-12-29 DIAGNOSIS — B351 Tinea unguium: Secondary | ICD-10-CM

## 2021-12-29 NOTE — Progress Notes (Signed)
? ?  SUBJECTIVE ?Patient with a history of diabetes mellitus and peripheral vascular disease presents to office today complaining of elongated, thickened nails that cause pain while ambulating in shoes.  Patient is unable to trim their own nails. ? ?Patient also develops preulcerative symptomatic callus lesions to the bilateral plantar aspect of the fifth MTP joint.  In the past these have ulcerated.  Patient states that the left callus has resolved completely.  It is no longer present.  He continues to get recurrence of the right plantar callus ? ? ?Past Medical History:  ?Diagnosis Date  ? CHF (congestive heart failure) ([place])   ? Chronic bronchitis ([place])   ? COPD (chronic obstructive pulmonary disease) ([place])   ? Diabetes mellitus without complication ([place])   ? Diastolic dysfunction   ? Dyspnea   ? with exertion  ? Hypertension   ? ? ?OBJECTIVE ?General Patient is awake, alert, and oriented x 3 and in no acute distress. ?Derm Skin is dry and supple bilateral. Negative open lesions or macerations. Remaining integument unremarkable. Nails are tender, long, thickened and dystrophic with subungual debris, consistent with onychomycosis, 1-5 bilateral. No signs of infection noted.  Hyperkeratotic preulcerative callus tissue noted to the plantar aspect of the fifth MTP right ?Vasc  DP and PT pedal pulses palpable bilaterally. Temperature gradient within normal limits.  ?Neuro Epicritic and protective threshold sensation diminished bilaterally.  ?Musculoskeletal Exam No symptomatic pedal deformities noted bilateral. Muscular strength within normal limits. ? ?ASSESSMENT ?1. Diabetes Mellitus w/ peripheral neuropathy ?2.  Pain due to onychomycosis of toenails bilateral ?3.  Preulcerative callus lesions right fifth MTP ?4.  Peripheral vascular disease ? ?PLAN OF CARE ?1. Patient evaluated today. ?2. Instructed to maintain good pedal hygiene and foot care. Stressed importance of controlling blood sugar.  ?3. Mechanical  debridement of nails 1-5 bilaterally performed using a nail nipper. Filed with dremel without incident.  ?4.  Excisional debridement of the hyperkeratotic preulcerative callus tissue was performed using a 312 scalpel.  There was some slight bleeding superficially to the plantar aspect of the fifth MTP right foot upon debridement.  Antibiotic cream and a Band-Aid was applied to both  ?5.  Continue management with vascular  ?6.  Appointment with Pedorthist for diabetic shoes and insoles  ?7.  Return to clinic in 3 mos.  ? ? ? ?Edrick Kins, DPM ?[place] ? ?Dr. Edrick Kins, DPM  ?  ?[address]                                      ?[place], [place] [address]                ?Office [phone]  ?Fax [phone] ? ? ? ? ? ?

## 2021-12-29 NOTE — Progress Notes (Signed)
SITUATION ?Reason for Visit: Fitting of Diabetic [place] ?Patient / Caregiver Report:  Patient is satisfied with fit and function of shoes and insoles. ? ?OBJECTIVE DATA: ?Patient History / Diagnosis:   ?  ICD-10-CM   ?1. Type 2 diabetes mellitus with hyperlipidemia (HCC)  E11.69   ? E78.5   ?  ?2. Diabetic ulcer of toe of left foot associated with diabetes mellitus due to underlying condition, with fat layer exposed ([place])  G01.749   ? S49.675   ?  ? ? ?Change in Status:   None ? ?ACTIONS PERFORMED: ?In-Person Delivery, patient was fit with: ?- 1x pair [id number] PDAC approved prefabricated Diabetic Shoes:  Apex A6000M Size 9W ?- 3x pair [id number] PDAC approved vacuum formed custom diabetic insoles; [employer]: [id number] ? ?Shoes and insoles were verified for structural integrity and safety. Patient wore shoes and insoles in office. Skin was inspected and free of areas of concern after wearing shoes and inserts. Shoes and inserts fit properly. Patient / Caregiver provided with ferbal instruction and demonstration regarding donning, doffing, wear, care, proper fit, function, purpose, cleaning, and use of shoes and insoles ' and in all related precautions and risks and benefits regarding shoes and insoles. Patient / Caregiver was instructed to wear properly fitting socks with shoes at all times. Patient was also provided with verbal instruction regarding how to report any failures or malfunctions of shoes or inserts, and necessary follow up care. Patient / Caregiver was also instructed to contact physician regarding change in status that may affect function of shoes and inserts.  ? ?Patient / Caregiver verbalized undersatnding of instruction provided. Patient / Caregiver demonstrated independence with proper donning and doffing of shoes and inserts. ? ?PLAN ?Patient to follow with treating physician as recommended. Plan of care was discussed with and agreed upon by patient and/or caregiver. All questions were answered and  concerns addressed. ? ?

## 2021-12-31 ENCOUNTER — Other Ambulatory Visit: Payer: Self-pay | Admitting: Internal Medicine

## 2022-01-04 ENCOUNTER — Other Ambulatory Visit: Payer: Self-pay | Admitting: Internal Medicine

## 2022-03-01 ENCOUNTER — Ambulatory Visit: Payer: Medicare HMO | Admitting: Pulmonary Disease

## 2022-03-01 ENCOUNTER — Encounter: Payer: Self-pay | Admitting: Pulmonary Disease

## 2022-03-01 VITALS — BP 118/68 | HR 84 | Temp 97.9°F | Ht 66.0 in | Wt 204.0 lb

## 2022-03-01 DIAGNOSIS — G4733 Obstructive sleep apnea (adult) (pediatric): Secondary | ICD-10-CM

## 2022-03-01 DIAGNOSIS — J449 Chronic obstructive pulmonary disease, unspecified: Secondary | ICD-10-CM

## 2022-03-01 DIAGNOSIS — Z7952 Long term (current) use of systemic steroids: Secondary | ICD-10-CM

## 2022-03-01 DIAGNOSIS — J9611 Chronic respiratory failure with hypoxia: Secondary | ICD-10-CM

## 2022-03-01 DIAGNOSIS — G2581 Restless legs syndrome: Secondary | ICD-10-CM

## 2022-03-01 DIAGNOSIS — J4550 Severe persistent asthma, uncomplicated: Secondary | ICD-10-CM | POA: Diagnosis not present

## 2022-03-01 DIAGNOSIS — J9612 Chronic respiratory failure with hypercapnia: Secondary | ICD-10-CM | POA: Diagnosis not present

## 2022-03-01 MED ORDER — AZITHROMYCIN 250 MG PO TABS: ORAL_TABLET | ORAL | 2 refills | Status: AC

## 2022-03-01 MED ORDER — ROPINIROLE HCL 1 MG PO TABS: 1.0000 mg | ORAL_TABLET | Freq: Every day | ORAL | 6 refills | Status: DC

## 2022-03-01 NOTE — Progress Notes (Signed)
Subjective:    Patient ID: Julian Sparks, male    DOB: [birth year], 76 y.o.   MRN: [id number] Patient Care Team: [name], MD as PCP - General (Internal Medicine) Minna Merritts, MD as PCP - Cardiology (Cardiology) Sindy Guadeloupe, MD as Consulting Physician (Hematology and Oncology)  Chief Complaint  Patient presents with   Follow-up    SOB with exertion, prod cough with dark yellow sputum and wheezing.     HPI  DATA: 04/16/2014 PFT's [facility]): FEV1 1.02 L or 35% predicted.  FVC reported at 67.4% predicted.  Small airways component FEF 25-75% at 13.9%.  DLCO 59%.  These are the only numbers available on record 01/24/2021 echocardiogram: LVEF 50 to 55%, hypokinesis of septal wall, grade 2 DD, RV function moderately reduced, RV size enlarged.  Right atrial size dilated.  No valvular abnormalities 04/06/2021 sleep study: Obstructive sleep apnea AHI 20.8, baseline oxygen saturation remained below 90% even with supplemental oxygen 05/09/2021 hypersensitivity pneumonitis (bird [employer]) panel: Normal 06/28/2021, allergen panel/IgE: IgE 507, class II sensitivities to dust mites, molds and trees. 07/28/2021 PFTs: FEV1 0.75 L or 29% predicted, FVC 1.67 L or 47% predicted, FEV1/FVC 45%.  No significant bronchodilator response lung volumes show significant air trapping.  There is small airways component.  Diffusion capacity severely reduced.  This represents a decline from his 61 PFTs  Review of Systems A 10 point review of systems was performed and it is as noted above otherwise negative.  Patient Active Problem List   Diagnosis Date Noted   Pneumonia of both lungs due to infectious organism    COPD with acute exacerbation ([place])    OSA (obstructive sleep apnea) 04/11/2021   Ulcer of left foot ([place]) 03/08/2021   Paroxysmal atrial tachycardia (HCC)    Metabolic alkalosis 18/84/1660   Acute metabolic encephalopathy 63/08/6008   Lymphoma, small lymphocytic ([place]) 02/04/2021   Acute cor  pulmonale (HCC)    Acute on chronic congestive heart failure (HCC)    Acute on chronic diastolic CHF (congestive heart failure) ([place]) 01/24/2021   Type 2 diabetes mellitus with hyperlipidemia ([place]) 01/24/2021   Acute on chronic respiratory failure ([place]) 01/24/2021   Hyperkalemia 01/24/2021   Obesity (BMI 30-39.9) 01/24/2021   Shortness of breath 10/26/2020   Essential hypertension 07/13/2020   Cellulitis of right lower extremity 07/13/2020   Need for influenza vaccination 05/11/2020   Chronic respiratory failure with hypoxia ([place]) 04/27/2020   Bronchitis 04/27/2020   Pleurodynia 04/27/2020   Obesity (BMI 30.0-34.9) 02/17/2020   Diabetes 1.5, managed as type 2 ([place]) 02/17/2020   Adhesive capsulitis of both shoulders 02/17/2020   Edema 07/19/2019   Leg swelling 07/19/2019   Diminished pulses in lower extremity 07/19/2019   Bursitis of shoulder 04/03/2019   Respiratory failure ([place]) 02/04/2019   Full thickness rotator cuff tear 05/23/2016   Social History   Tobacco Use   Smoking status: Former    Packs/day: 2.00    Years: 53.00    Total pack years: 106.00    Types: Cigarettes    Quit date: 2014    Years since quitting: 9.5   Smokeless tobacco: Former    Types: Chew  Substance Use Topics   Alcohol use: No   Allergies  Allergen Reactions   Aspirin Nausea Only and Other (See Comments)    325 mg aspirin cause stomach upset   Penicillins Rash   Current Meds  Medication Sig   ACCU-CHEK AVIVA PLUS test strip TEST BLOOD SUGAR TWICE DAILY  Accu-Chek Softclix Lancets lancets CHECK BLOOD SUGAR TWO TIMES DAILY   albuterol (PROVENTIL) (2.5 MG/3ML) 0.083% nebulizer solution Take 3 mLs (2.5 mg total) by nebulization every 6 (six) hours as needed for wheezing or shortness of breath.   albuterol (VENTOLIN HFA) 108 (90 Base) MCG/ACT inhaler Inhale 2 puffs into the lungs every 6 (six) hours as needed for wheezing or shortness of breath.   Alcohol Swabs (B-D SINGLE USE SWABS REGULAR)  PADS USE AS DIRECTED   aspirin 81 MG EC tablet Take 81 mg by mouth daily. Swallow whole.    atorvastatin (LIPITOR) 40 MG tablet TAKE 1 TABLET EVERY DAY   B Complex-C (SUPER B COMPLEX PO) Take 1 tablet by mouth daily.    Blood Glucose Monitoring Suppl (ACCU-CHEK AVIVA PLUS) w/Device KIT Check blood sugar BID   Budeson-Glycopyrrol-Formoterol (BREZTRI AEROSPHERE) 160-9-4.8 MCG/ACT AERO Inhale 2 puffs into the lungs in the morning and at bedtime.   Dupilumab (DUPIXENT) 300 MG/2ML SOPN Inject 679m into the skin at Week 0, then 3072mevery 14 days.   empagliflozin (JARDIANCE) 10 MG TABS tablet TAKE 1 TABLET(10 MG) BY MOUTH DAILY   furosemide (LASIX) 20 MG tablet TAKE 2 TABLETS EVERY DAY   gabapentin (NEURONTIN) 300 MG capsule TAKE 1 CAPSULE THREE TIMES DAILY   metFORMIN (GLUCOPHAGE) 500 MG tablet TAKE 1 TABLET TWICE DAILY   metolazone (ZAROXOLYN) 5 MG tablet For weight 201 or higher take metolazone 5 mg, then 30 minutes later take lasix 40 mg   metoprolol succinate (TOPROL-XL) 25 MG 24 hr tablet TAKE 1 TABLET(25 MG) BY MOUTH DAILY   predniSONE (DELTASONE) 5 MG tablet TAKE 2 TABLETS EVERY DAY WITH BREAKFAST   Pseudoeph-Doxylamine-DM-APAP (NYQUIL PO) Take by mouth as needed.   rOPINIRole (REQUIP) 0.5 MG tablet TAKE 1 TABLET(0.5 MG) BY MOUTH AT BEDTIME   traZODone (DESYREL) 50 MG tablet TAKE 2 TABLETS EVERY DAY   Immunization History  Administered Date(s) Administered   Fluad Quad(high Dose 65+) 05/11/2020, 05/31/2021   Influenza,inj,quad, With Preservative 06/27/2016   PFIZER(Purple Top)SARS-COV-2 Vaccination 11/23/2019, 12/16/2019   Pneumococcal Conjugate-13 04/16/2014   Pneumococcal-Unspecified 05/23/2014   Zoster Recombinat (Shingrix) 01/24/2015       Objective:   Physical Exam BP 118/68 (BP Location: Left Arm, Cuff Size: Normal)   Pulse 84   Temp 97.9 F (36.6 C) (Temporal)   Ht _0  (1.676 m)   Wt 204 lb (92.5 kg)   SpO2 90%   BMI 32.93 kg/m  GENERAL: Overweight gentleman, no  acute distress.  Looks refreshed.  Presents in transport chair. Comfortable on 2 l/min nasal cannula O2 saturating 90%.  No use of accessories. HEAD: Normocephalic, atraumatic.  EYES: Pupils equal, round, reactive to light.  No scleral icterus.  MOUTH: Oral mucosa moist, no thrush. NECK: Supple. No thyromegaly. Trachea midline. No JVD.  No adenopathy. PULMONARY: Good air entry bilaterally.  Coarse breath sounds throughout otherwise no adventitious sounds. CARDIOVASCULAR: S1 and S2. Regular rate and rhythm.  No rubs, murmurs or gallops heard.   ABDOMEN: Protuberant, soft, benign. MUSCULOSKELETAL: No joint deformity, no clubbing, no edema.  NEUROLOGIC: No overt focal deficit. SKIN: Intact,warm,dry.  Psoriatic changes vs. onychomycosis on the nails on left hand. PSYCH: Mood and behavior normal.  He is actually very conversant and jovial today.      Assessment & Plan:     ICD-10-CM   1. Stage 3 severe COPD by GOLD classification ([place] J44.9    Continue current regimen Breztri 2 puffs twice a day As  needed albuterol    2. Severe persistent asthma dependent on systemic steroids  J45.50    Z79.52    Continue Dupixent Decrease prednisone to 1 tablet daily (5 mg)    3. Chronic respiratory failure with hypoxia and hypercapnia (HCC)  J96.11    J96.12    Continue supplemental oxygen at 2 L/min Patient compliant Continue Trilogy vent    4. Restless legs syndrome  G25.81    Still symptomatic Increase Requip to 1 mg at bedtime Reiterated compliance with Trilogy ventilator    5. OSA (obstructive sleep apnea)  G47.33    On Trilogy ventilator Compliance has been erratic Advise need for compliance otherwise unit will be removed     Meds ordered this encounter  Medications   ROPINIRole (REQUIP) 1 MG tablet    Sig: Take 1 tablet (1 mg total) by mouth at bedtime.    Dispense:  30 tablet    Refill:  6   azithromycin (ZITHROMAX) 250 MG tablet    Sig: 1 tablet on Monday Wednesday Friday  (3 times a week)    Dispense:  12 each    Refill:  2   Will see the patient in follow-up in 3 months time he is to contact us prior to that time should any new difficulties arise.  Renold Don, MD Advanced Bronchoscopy PCCM  [facility]    *This note was dictated using voice recognition software/Dragon.  Despite best efforts to proofread, errors can occur which can change the meaning. Any transcriptional errors that result from this process are unintentional and may not be fully corrected at the time of dictation.

## 2022-03-01 NOTE — Patient Instructions (Signed)
You may decrease your prednisone to 1 tablet daily (5 mg)  We have sent in a prescription for an antibiotic that you will take Monday Wednesday and Friday (1 tablet 3 times a week)  I have increased the dose of the Requip (your bedtime pill) to 1 mg   Continue taking your Dupixent shots  We will see you in follow-up in 3 months time call sooner should any new problems arise.

## 2022-03-07 ENCOUNTER — Ambulatory Visit (INDEPENDENT_AMBULATORY_CARE_PROVIDER_SITE_OTHER): Payer: Medicare HMO | Admitting: Internal Medicine

## 2022-03-07 ENCOUNTER — Encounter: Payer: Self-pay | Admitting: Internal Medicine

## 2022-03-07 VITALS — BP 150/66 | HR 81 | Ht 66.0 in | Wt 203.2 lb

## 2022-03-07 DIAGNOSIS — M7501 Adhesive capsulitis of right shoulder: Secondary | ICD-10-CM | POA: Diagnosis not present

## 2022-03-07 DIAGNOSIS — G4733 Obstructive sleep apnea (adult) (pediatric): Secondary | ICD-10-CM

## 2022-03-07 DIAGNOSIS — E669 Obesity, unspecified: Secondary | ICD-10-CM | POA: Diagnosis not present

## 2022-03-07 DIAGNOSIS — E139 Other specified diabetes mellitus without complications: Secondary | ICD-10-CM

## 2022-03-07 DIAGNOSIS — I10 Essential (primary) hypertension: Secondary | ICD-10-CM | POA: Diagnosis not present

## 2022-03-07 DIAGNOSIS — M7502 Adhesive capsulitis of left shoulder: Secondary | ICD-10-CM

## 2022-03-07 LAB — GLUCOSE, POCT (MANUAL RESULT ENTRY): POC Glucose: 227 mg/dl — AB (ref 70–99)

## 2022-03-07 MED ORDER — HYDROCOD POLI-CHLORPHE POLI ER 10-8 MG/5ML PO SUER: 5.0000 mL | Freq: Two times a day (BID) | ORAL | 0 refills | Status: AC

## 2022-03-07 NOTE — Addendum Note (Signed)
Addended by: [name] on: 03/07/2022 09:33 AM   Modules accepted: Orders

## 2022-03-07 NOTE — Assessment & Plan Note (Signed)

## 2022-03-07 NOTE — Assessment & Plan Note (Signed)
Chronic problem. 

## 2022-03-07 NOTE — Addendum Note (Signed)
Addended by: Alois Cliche on: 03/07/2022 09:31 AM   Modules accepted: Orders

## 2022-03-07 NOTE — Assessment & Plan Note (Signed)

## 2022-03-07 NOTE — Assessment & Plan Note (Signed)
Stable at the present time. 

## 2022-03-07 NOTE — Progress Notes (Signed)
Established Patient Office Visit  Subjective:  Patient ID: Julian Sparks, male    DOB: 21-Mar-1946  Age: 76 y.o. MRN: [id number]  CC:  Chief Complaint  Patient presents with   Diabetes    Diabetes    Julian Sparks presents for check up  Past Medical History:  Diagnosis Date   CHF (congestive heart failure) ([place])    Chronic bronchitis ([place])    COPD (chronic obstructive pulmonary disease) ([place])    Diabetes mellitus without complication ([place])    Diastolic dysfunction    Dyspnea    with exertion   Hypertension     Past Surgical History:  Procedure Laterality Date   CIRCUMCISION     SHOULDER ARTHROSCOPY WITH OPEN ROTATOR CUFF REPAIR Left 06/20/2016   Procedure: SHOULDER ARTHROSCOPY WITH OPEN ROTATOR CUFF REPAIR;  Surgeon: Earnestine Leys, MD;  Location: [facility] ORS;  Service: Orthopedics;  Laterality: Left;   UPPER GI ENDOSCOPY      Family History  Problem Relation Age of Onset   Hypertension Mother    Cancer Sister    Diabetes Brother     Social History   Socioeconomic History   Marital status: Widowed    Spouse name: Not on file   Number of children: 2   Years of education: 9   Highest education level: Some college, no degree  Occupational History   Not on file  Tobacco Use   Smoking status: Former    Packs/day: 2.00    Years: 53.00    Total pack years: 106.00    Types: Cigarettes    Quit date: 2014    Years since quitting: 9.5   Smokeless tobacco: Former    Types: Nurse, children's Use: Never used  Substance and Sexual Activity   Alcohol use: No   Drug use: No   Sexual activity: Not Currently  Other Topics Concern   Not on file  Social History Narrative   Not on file   Social Determinants of Health   Financial Resource Strain: Low Risk  (05/05/2021)   Overall Financial Resource Strain (CARDIA)    Difficulty of Paying Living Expenses: Not hard at all  Food Insecurity: No Food Insecurity (05/05/2021)   Hunger Vital Sign    Worried About  Running Out of Food in the Last Year: Never true    [place] in the Last Year: Never true  Transportation Needs: No Transportation Needs (05/05/2021)   PRAPARE - Hydrologist (Medical): No    Lack of Transportation (Non-Medical): No  Physical Activity: Sufficiently Active (05/05/2021)   Exercise Vital Sign    Days of Exercise per Week: 7 days    Minutes of Exercise per Session: 40 min  Stress: No Stress Concern Present (05/05/2021)   [place]    Feeling of Stress : Not at all  Social Connections: Socially Isolated (05/05/2021)   Social Connection and Isolation Panel [NHANES]    Frequency of Communication with Friends and Family: More than three times a week    Frequency of Social Gatherings with Friends and Family: More than three times a week    Attends Religious Services: Never    Marine scientist or Organizations: No    Attends Archivist Meetings: Never    Marital Status: Widowed  Intimate Partner Violence: Not At Risk (05/05/2021)   Humiliation, Afraid, Rape, and Kick questionnaire  Fear of Current or Ex-Partner: No    Emotionally Abused: No    Physically Abused: No    Sexually Abused: No     Current Outpatient Medications:    ACCU-CHEK AVIVA PLUS test strip, TEST BLOOD SUGAR TWICE DAILY, Disp: 200 strip, Rfl: 4   Accu-Chek Softclix Lancets lancets, CHECK BLOOD SUGAR TWO TIMES DAILY, Disp: 200 each, Rfl: 4   albuterol (PROVENTIL) (2.5 MG/3ML) 0.083% nebulizer solution, Take 3 mLs (2.5 mg total) by nebulization every 6 (six) hours as needed for wheezing or shortness of breath., Disp: 216 mL, Rfl: 3   albuterol (VENTOLIN HFA) 108 (90 Base) MCG/ACT inhaler, Inhale 2 puffs into the lungs every 6 (six) hours as needed for wheezing or shortness of breath., Disp: 18 g, Rfl: 3   Alcohol Swabs (B-D SINGLE USE SWABS REGULAR) PADS, USE AS DIRECTED, Disp: 300 each, Rfl: 3    aspirin 81 MG EC tablet, Take 81 mg by mouth daily. Swallow whole. , Disp: , Rfl:    atorvastatin (LIPITOR) 40 MG tablet, TAKE 1 TABLET EVERY DAY, Disp: 90 tablet, Rfl: 3   B Complex-C (SUPER B COMPLEX PO), Take 1 tablet by mouth daily. , Disp: , Rfl:    Blood Glucose Monitoring Suppl (ACCU-CHEK AVIVA PLUS) w/Device KIT, Check blood sugar BID, Disp: 1 kit, Rfl: 3   Budeson-Glycopyrrol-Formoterol (BREZTRI AEROSPHERE) 160-9-4.8 MCG/ACT AERO, Inhale 2 puffs into the lungs in the morning and at bedtime., Disp: 32.1 g, Rfl: 3   Dupilumab (DUPIXENT) 300 MG/2ML SOPN, Inject 626m into the skin at Week 0, then 3047mevery 14 days., Disp: 12 mL, Rfl: 0   empagliflozin (JARDIANCE) 10 MG TABS tablet, TAKE 1 TABLET(10 MG) BY MOUTH DAILY, Disp: 90 tablet, Rfl: 3   furosemide (LASIX) 20 MG tablet, TAKE 2 TABLETS EVERY DAY, Disp: 180 tablet, Rfl: 3   gabapentin (NEURONTIN) 300 MG capsule, TAKE 1 CAPSULE THREE TIMES DAILY, Disp: 270 capsule, Rfl: 3   metFORMIN (GLUCOPHAGE) 500 MG tablet, TAKE 1 TABLET TWICE DAILY, Disp: 180 tablet, Rfl: 3   metolazone (ZAROXOLYN) 5 MG tablet, For weight 201 or higher take metolazone 5 mg, then 30 minutes later take lasix 40 mg, Disp: 90 tablet, Rfl: 0   metoprolol succinate (TOPROL-XL) 25 MG 24 hr tablet, TAKE 1 TABLET(25 MG) BY MOUTH DAILY, Disp: 90 tablet, Rfl: 3   predniSONE (DELTASONE) 5 MG tablet, TAKE 2 TABLETS EVERY DAY WITH BREAKFAST, Disp: 120 tablet, Rfl: 4   Pseudoeph-Doxylamine-DM-APAP (NYQUIL PO), Take by mouth as needed., Disp: , Rfl:    rOPINIRole (REQUIP) 1 MG tablet, Take 1 tablet (1 mg total) by mouth at bedtime., Disp: 30 tablet, Rfl: 6   traZODone (DESYREL) 50 MG tablet, TAKE 2 TABLETS EVERY DAY, Disp: 180 tablet, Rfl: 3   Allergies  Allergen Reactions   Aspirin Nausea Only and Other (See Comments)    325 mg aspirin cause stomach upset   Penicillins Rash    ROS Review of Systems  Constitutional: Negative.   HENT: Negative.    Eyes: Negative.    Respiratory:  Positive for cough, choking, shortness of breath and wheezing.   Cardiovascular: Negative.   Gastrointestinal: Negative.   Endocrine: Negative.   Genitourinary: Negative.   Musculoskeletal: Negative.   Skin: Negative.   Allergic/Immunologic: Negative.   Neurological: Negative.   Hematological: Negative.   Psychiatric/Behavioral: Negative.    All other systems reviewed and are negative.     Objective:    Physical Exam Vitals reviewed.  Constitutional:  Appearance: Normal appearance.  HENT:     Mouth/Throat:     Mouth: Mucous membranes are moist.  Eyes:     Pupils: Pupils are equal, round, and reactive to light.  Neck:     Vascular: No carotid bruit.  Cardiovascular:     Rate and Rhythm: Normal rate and regular rhythm.     Pulses: Normal pulses.     Heart sounds: Normal heart sounds.  Pulmonary:     Effort: Pulmonary effort is normal.     Breath sounds: Rhonchi present.  Abdominal:     General: Bowel sounds are normal.     Palpations: Abdomen is soft. There is no hepatomegaly, splenomegaly or mass.     Tenderness: There is no abdominal tenderness.     Hernia: No hernia is present.  Musculoskeletal:     Cervical back: Neck supple.     Right lower leg: No edema.     Left lower leg: No edema.  Skin:    Findings: No rash.  Neurological:     Mental Status: He is alert and oriented to person, place, and time.     Motor: No weakness.  Psychiatric:        Mood and Affect: Mood normal.        Behavior: Behavior normal.     BP (!) 150/66   Pulse 81   Ht 5' 6" (1.676 m)   Wt 203 lb 3.2 oz (92.2 kg)   SpO2 91% Comment: 2 liters  BMI 32.80 kg/m  Wt Readings from Last 3 Encounters:  03/07/22 203 lb 3.2 oz (92.2 kg)  03/01/22 204 lb (92.5 kg)  12/22/21 200 lb 9.6 oz (91 kg)     Health Maintenance Due  Topic Date Due   FOOT EXAM  Never done   OPHTHALMOLOGY EXAM  Never done   URINE MICROALBUMIN  Never done   Hepatitis C Screening  Never done    TETANUS/TDAP  Never done   Zoster Vaccines- Shingrix (2 of 2) 03/21/2015    There are no preventive care reminders to display for this patient.  Lab Results  Component Value Date   TSH 1.152 01/24/2021   Lab Results  Component Value Date   WBC 10.3 09/21/2021   HGB 14.0 09/21/2021   HCT 45.1 09/21/2021   MCV 97.0 09/21/2021   PLT 182 09/21/2021   Lab Results  Component Value Date   NA 138 09/21/2021   K 4.9 09/21/2021   CO2 34 (H) 09/21/2021   GLUCOSE 361 (H) 09/21/2021   BUN 30 (H) 09/21/2021   CREATININE 1.00 09/21/2021   BILITOT 1.0 04/13/2021   ALKPHOS 74 04/13/2021   AST 18 04/13/2021   ALT 16 04/13/2021   PROT 7.3 04/13/2021   ALBUMIN 3.8 04/13/2021   CALCIUM 8.8 (L) 09/21/2021   ANIONGAP 10 09/21/2021   Lab Results  Component Value Date   CHOL 101 01/26/2021   Lab Results  Component Value Date   HDL 46 01/26/2021   Lab Results  Component Value Date   LDLCALC 36 01/26/2021   Lab Results  Component Value Date   TRIG 97 01/26/2021   Lab Results  Component Value Date   CHOLHDL 2.2 01/26/2021   Lab Results  Component Value Date   HGBA1C 8.2 (A) 12/06/2021      Assessment & Plan:   Problem List Items Addressed This Visit       Cardiovascular and Mediastinum   Essential hypertension     Patient  denies any chest pain or shortness of breath there is no history of palpitation or paroxysmal nocturnal dyspnea   patient was advised to follow low-salt low-cholesterol diet    ideally I want to keep systolic blood pressure below 130 mmHg, patient was asked to check blood pressure one times a week and give me a report on that.  Patient will be follow-up in 3 months  or earlier as needed, patient will call me back for any change in the cardiovascular symptoms Patient was advised to buy a book from local bookstore concerning blood pressure and read several chapters  every day.  This will be supplemented by some of the material we will give him from the  office.  Patient should also utilize other resources like YouTube and Internet to learn more about the blood pressure and the diet.        Respiratory   OSA (obstructive sleep apnea)    Stable at the present time        Endocrine   Diabetes 1.5, managed as type 2 ([place]) - Primary    - The patient's blood sugar is labile on med. - The patient will continue the current treatment regimen.  - I encouraged the patient to regularly check blood sugar.  - I encouraged the patient to monitor diet. I encouraged the patient to eat low-carb and low-sugar to help prevent blood sugar spikes.  - I encouraged the patient to continue following their prescribed treatment plan for diabetes - I informed the patient to get help if blood sugar drops below 2m/dL, or if suddenly have trouble thinking clearly or breathing.  Patient was advised to buy a book on diabetes from a local bookstore or from [place]  Patient should read 2 chapters every day to keep the motivation going, this is in addition to some of the materials we provided them from the office.  There are other resources on the Internet like YouTube and wilkipedia to get an education on the diabetes      Relevant Orders   POCT glucose (manual entry) (Completed)     Musculoskeletal and Integument   Adhesive capsulitis of both shoulders    Chronic problem        Other   Obesity (BMI 30-39.9)    - I encouraged the patient to lose weight.  - I educated them on making healthy dietary choices including eating more fruits and vegetables and less fried foods. - I encouraged the patient to exercise more, and educated on the benefits of exercise including weight loss, diabetes prevention, and hypertension prevention.   Dietary counseling with a registered dietician  Referral to a weight management support group (e.g. Weight Watchers, Overeaters Anonymous)  If your BMI is greater than 29 or you have gained more than 15 pounds you should work on weight  loss.  Attend a healthy cooking class        No orders of the defined types were placed in this encounter.   Follow-up: No follow-ups on file.    JCletis Athens MD

## 2022-03-07 NOTE — Assessment & Plan Note (Signed)

## 2022-03-09 ENCOUNTER — Other Ambulatory Visit: Payer: Self-pay | Admitting: Internal Medicine

## 2022-03-13 ENCOUNTER — Other Ambulatory Visit: Payer: Self-pay | Admitting: Pulmonary Disease

## 2022-03-13 ENCOUNTER — Telehealth: Payer: Self-pay | Admitting: Pulmonary Disease

## 2022-03-13 ENCOUNTER — Other Ambulatory Visit: Payer: Self-pay | Admitting: Internal Medicine

## 2022-03-13 NOTE — Telephone Encounter (Signed)
Albuterol solution was sent to [place].  Patient is aware and voiced his understanding. Nothing further needed.

## 2022-03-29 ENCOUNTER — Other Ambulatory Visit: Payer: Self-pay | Admitting: Pharmacist

## 2022-03-29 DIAGNOSIS — J4550 Severe persistent asthma, uncomplicated: Secondary | ICD-10-CM

## 2022-03-29 MED ORDER — DUPIXENT 300 MG/2ML ~~LOC~~ SOAJ: 300.0000 mg | SUBCUTANEOUS | 1 refills | Status: DC

## 2022-03-29 NOTE — Telephone Encounter (Signed)
Refill sent for Julian Sparks to [facility]: [phone]  Dose: 300 mg SQ every 2 weeks  Last OV: 03/01/22 Provider: Dr. Patsey Berthold   Next OV: 05/31/22  Knox Saliva, PharmD, MPH, BCPS Clinical Pharmacist (Rheumatology and Pulmonology)

## 2022-03-30 ENCOUNTER — Ambulatory Visit: Payer: Medicare HMO | Admitting: Podiatry

## 2022-04-06 ENCOUNTER — Ambulatory Visit (INDEPENDENT_AMBULATORY_CARE_PROVIDER_SITE_OTHER): Payer: Medicare HMO | Admitting: Podiatry

## 2022-04-06 DIAGNOSIS — M79675 Pain in left toe(s): Secondary | ICD-10-CM

## 2022-04-06 DIAGNOSIS — B351 Tinea unguium: Secondary | ICD-10-CM

## 2022-04-06 DIAGNOSIS — M79674 Pain in right toe(s): Secondary | ICD-10-CM

## 2022-04-06 NOTE — Progress Notes (Signed)
   Chief Complaint  Patient presents with   foot care    Foot care    SUBJECTIVE Patient with a history of diabetes mellitus presents to office today complaining of elongated, thickened nails that cause pain while ambulating in shoes.  Patient is unable to trim their own nails. Patient is here for further evaluation and treatment.   Past Medical History:  Diagnosis Date   CHF (congestive heart failure) ([facility])    Chronic bronchitis ([facility])    COPD (chronic obstructive pulmonary disease) ([facility])    Diabetes mellitus without complication ([facility])    Diastolic dysfunction    Dyspnea    with exertion   Hypertension     OBJECTIVE General Patient is awake, alert, and oriented x 3 and in no acute distress. Derm Skin is dry and supple bilateral. Negative open lesions or macerations. Remaining integument unremarkable. Nails are tender, long, thickened and dystrophic with subungual debris, consistent with onychomycosis, 1-5 bilateral. No signs of infection noted. Vasc  DP and PT pedal pulses palpable bilaterally. Temperature gradient within normal limits.  Neuro Epicritic and protective threshold sensation diminished bilaterally.  Musculoskeletal Exam No symptomatic pedal deformities noted bilateral. Muscular strength within normal limits.  ASSESSMENT 1. Diabetes Mellitus w/ peripheral neuropathy 2.  Pain due to onychomycosis of toenails bilateral  PLAN OF CARE 1. Patient evaluated today. 2. Instructed to maintain good pedal hygiene and foot care. Stressed importance of controlling blood sugar.  3. Mechanical debridement of nails 1-5 bilaterally performed using a nail nipper. Filed with dremel without incident.  4. Return to clinic in 3 mos.     Edrick Kins, DPM [facility]  Dr. Edrick Kins, DPM    [address], [place] [address]                Office [phone]  Fax [phone]

## 2022-04-09 ENCOUNTER — Other Ambulatory Visit: Payer: Self-pay | Admitting: Internal Medicine

## 2022-04-10 ENCOUNTER — Other Ambulatory Visit: Payer: Self-pay | Admitting: Podiatry

## 2022-04-10 ENCOUNTER — Telehealth: Payer: Self-pay

## 2022-04-10 MED ORDER — GENTAMICIN SULFATE 0.1 % EX CREA: 1.0000 | TOPICAL_CREAM | Freq: Two times a day (BID) | CUTANEOUS | 1 refills | Status: AC

## 2022-04-10 NOTE — Telephone Encounter (Signed)
Patient is aware 

## 2022-04-10 NOTE — Telephone Encounter (Signed)
Prescription for antibiotic cream gentamicin sent to the pharmacy.  Please notify patient.  Thanks, Dr. Amalia Hailey

## 2022-04-12 ENCOUNTER — Encounter: Payer: Self-pay | Admitting: Family

## 2022-04-12 ENCOUNTER — Ambulatory Visit (HOSPITAL_BASED_OUTPATIENT_CLINIC_OR_DEPARTMENT_OTHER): Payer: Medicare HMO | Admitting: Family

## 2022-04-12 ENCOUNTER — Other Ambulatory Visit
Admission: RE | Admit: 2022-04-12 | Discharge: 2022-04-12 | Disposition: A | Discharge status: Home / Self Care | Payer: Medicare HMO | Source: Ambulatory Visit | Attending: Family | Admitting: Family

## 2022-04-12 ENCOUNTER — Telehealth: Payer: Self-pay

## 2022-04-12 VITALS — BP 160/69 | HR 89 | Resp 18 | Ht 66.0 in | Wt 201.2 lb

## 2022-04-12 DIAGNOSIS — I10 Essential (primary) hypertension: Secondary | ICD-10-CM

## 2022-04-12 DIAGNOSIS — Z87891 Personal history of nicotine dependence: Secondary | ICD-10-CM | POA: Insufficient documentation

## 2022-04-12 DIAGNOSIS — E119 Type 2 diabetes mellitus without complications: Secondary | ICD-10-CM | POA: Insufficient documentation

## 2022-04-12 DIAGNOSIS — J449 Chronic obstructive pulmonary disease, unspecified: Secondary | ICD-10-CM | POA: Insufficient documentation

## 2022-04-12 DIAGNOSIS — I110 Hypertensive heart disease with heart failure: Secondary | ICD-10-CM | POA: Insufficient documentation

## 2022-04-12 DIAGNOSIS — I5032 Chronic diastolic (congestive) heart failure: Secondary | ICD-10-CM | POA: Insufficient documentation

## 2022-04-12 DIAGNOSIS — Z833 Family history of diabetes mellitus: Secondary | ICD-10-CM | POA: Insufficient documentation

## 2022-04-12 DIAGNOSIS — Z9981 Dependence on supplemental oxygen: Secondary | ICD-10-CM | POA: Insufficient documentation

## 2022-04-12 DIAGNOSIS — Z8249 Family history of ischemic heart disease and other diseases of the circulatory system: Secondary | ICD-10-CM | POA: Insufficient documentation

## 2022-04-12 DIAGNOSIS — Z7984 Long term (current) use of oral hypoglycemic drugs: Secondary | ICD-10-CM | POA: Insufficient documentation

## 2022-04-12 LAB — BASIC METABOLIC PANEL
Anion gap: 11 (ref 5–15)
BUN: 35 mg/dL — ABNORMAL HIGH (ref 8–23)
CO2: 33 mmol/L — ABNORMAL HIGH (ref 22–32)
Calcium: 9.1 mg/dL (ref 8.9–10.3)
Chloride: 94 mmol/L — ABNORMAL LOW (ref 98–111)
Creatinine, Ser: 1.13 mg/dL (ref 0.61–1.24)
GFR, Estimated: >60 mL/min (ref >60)
Glucose, Bld: 273 mg/dL — ABNORMAL HIGH (ref 70–99)
Potassium: 4.3 mmol/L (ref 3.5–5.1)
Sodium: 138 mmol/L (ref 135–145)

## 2022-04-12 MED ORDER — LOSARTAN POTASSIUM 25 MG PO TABS: 25.0000 mg | ORAL_TABLET | Freq: Every day | ORAL | 3 refills | Status: AC

## 2022-04-12 NOTE — Progress Notes (Signed)
Patient ID: Julian Sparks, male    DOB: 1945/09/19, 76 y.o.   MRN: [id number]  HPI  Julian Sparks is a 76 y/o male with a history of DM, HTN, COPD, previous tobacco use and chronic heart failure.   Echo report from 01/24/21 reviewed and showed an EF of 50-55% with severe RAE but without LVH.   Was in the ED 09/21/21 due to acute on chronic HF with 5 pound weight gain overnight. Given IV lasix with improvement of symptoms and he was released.    He presents today for a follow-up visit with minimal complaints. He is assisted by his daughter. He denies any headaches, dizziness, chest pain, palpitations, abdominal pain, constipation or diarrhea. He has shortness of breath, fatigue, cough, wheezing & easy bruising along with this. Describes this as being chronic in nature having been present for several years and feels like it's "the same as always".   He does his best to follow a low sodium diet and does not add salt when he cooks. He does not drink sugary drinks and monitors his blood sugar once daily and sugars range between 187-294.    Past Medical History:  Diagnosis Date   CHF (congestive heart failure) (H[facility])    Chronic bronchitis ([facility])    COPD (chronic obstructive pulmonary disease) ([facility])    Diabetes mellitus without complication ([facility])    Diastolic dysfunction    Dyspnea    with exertion   Hypertension    Past Surgical History:  Procedure Laterality Date   CIRCUMCISION     SHOULDER ARTHROSCOPY WITH OPEN ROTATOR CUFF REPAIR Left 06/20/2016   Procedure: SHOULDER ARTHROSCOPY WITH OPEN ROTATOR CUFF REPAIR;  Surgeon: Earnestine Leys, MD;  Location: [facility] ORS;  Service: Orthopedics;  Laterality: Left;   UPPER GI ENDOSCOPY     Family History  Problem Relation Age of Onset   Hypertension Mother    Cancer Sister    Diabetes Brother    Social History   Tobacco Use   Smoking status: Former    Packs/day: 2.00    Years: 53.00    Total pack years: 106.00    Types: Cigarettes    Quit date:  2014    Years since quitting: 9.6   Smokeless tobacco: Former    Types: Chew  Substance Use Topics   Alcohol use: No   Allergies  Allergen Reactions   Aspirin Nausea Only and Other (See Comments)    325 mg aspirin cause stomach upset   Penicillins Rash   Prior to Admission medications   Medication Sig Start Date End Date Taking? Authorizing Provider  ACCU-CHEK AVIVA PLUS test strip TEST BLOOD SUGAR TWICE DAILY 12/25/21  Yes Masoud, [name], MD  Accu-Chek Softclix Lancets lancets CHECK BLOOD SUGAR TWO TIMES DAILY 01/01/22  Yes Masoud, [name], MD  albuterol (PROVENTIL) (2.5 MG/3ML) 0.083% nebulizer solution INHALE THE CONTENTS OF 1 VIAL VIA NEBULIZER EVERY 6 HOURS AS NEEDED FOR SHORTNESS OF BREATH OR WHEEZING 03/13/22  Yes Tyler Pita, MD  albuterol (VENTOLIN HFA) 108 (90 Base) MCG/ACT inhaler Inhale 2 puffs into the lungs every 6 (six) hours as needed for wheezing or shortness of breath. 12/06/21  Yes Masoud, [name], MD  Alcohol Swabs (DROPSAFE ALCOHOL PREP) 70 % PADS USE AS DIRECTED 03/09/22  Yes Masoud, [name], MD  aspirin 81 MG EC tablet Take 81 mg by mouth daily. Swallow whole.    Yes [provider]  atorvastatin (LIPITOR) 40 MG tablet TAKE 1 TABLET EVERY DAY 03/13/22  Yes Masoud, [name], MD  B Complex-C (SUPER B COMPLEX PO) Take 1 tablet by mouth daily.    Yes [provider]  Blood Glucose Monitoring Suppl (ACCU-CHEK AVIVA PLUS) w/Device KIT Check blood sugar BID 11/01/20  Yes Masoud, [name], MD  Budeson-Glycopyrrol-Formoterol (BREZTRI AEROSPHERE) 160-9-4.8 MCG/ACT AERO Inhale 2 puffs into the lungs in the morning and at bedtime. 12/05/21  Yes Tyler Pita, MD  chlorpheniramine-HYDROcodone [facility] PENNKINETIC ER) 10-8 MG/5ML Take 5 mLs by mouth 2 (two) times daily. 03/07/22  Yes Masoud, [name], MD  Dupilumab (DUPIXENT) 300 MG/2ML SOPN Inject 300 mg into the skin every 14 (fourteen) days. 03/29/22  Yes Tyler Pita, MD  empagliflozin (JARDIANCE) 10 MG TABS tablet  TAKE 1 TABLET(10 MG) BY MOUTH DAILY 11/10/21  Yes Gollan, Kathlene November, MD  furosemide (LASIX) 20 MG tablet TAKE 2 TABLETS EVERY DAY 12/25/21  Yes Masoud, [name], MD  gabapentin (NEURONTIN) 300 MG capsule TAKE 1 CAPSULE THREE TIMES DAILY 08/29/21  Yes Masoud, [name], MD  gentamicin cream (GARAMYCIN) 0.1 % Apply 1 Application topically 2 (two) times daily. 04/10/22  Yes Edrick Kins, DPM  glipiZIDE (GLUCOTROL XL) 5 MG 24 hr tablet TAKE 1 TABLET TWICE DAILY 04/09/22  Yes Masoud, [name], MD  metFORMIN (GLUCOPHAGE) 500 MG tablet TAKE 1 TABLET TWICE DAILY 03/13/22  Yes Masoud, [name], MD  metolazone (ZAROXOLYN) 5 MG tablet For weight 201 or higher take metolazone 5 mg, then 30 minutes later take lasix 40 mg 06/07/21  Yes Gollan, Kathlene November, MD  metoprolol succinate (TOPROL-XL) 25 MG 24 hr tablet TAKE 1 TABLET(25 MG) BY MOUTH DAILY 11/13/21  Yes Masoud, [name], MD  predniSONE (DELTASONE) 5 MG tablet TAKE 2 TABLETS EVERY DAY WITH BREAKFAST Patient taking differently: Take 5 mg by mouth daily with breakfast. Weaning. Now taking 1 tablet daily 01/04/22  Yes Masoud, [name], MD  Pseudoeph-Doxylamine-DM-APAP (NYQUIL PO) Take by mouth as needed.   Yes [provider]  rOPINIRole (REQUIP) 1 MG tablet Take 1 tablet (1 mg total) by mouth at bedtime. 03/01/22  Yes Tyler Pita, MD  traZODone (DESYREL) 50 MG tablet TAKE 2 TABLETS EVERY DAY 05/08/21  Yes [name], MD    Review of Systems  Constitutional:  Positive for fatigue. Negative for appetite change.  HENT:  Negative for congestion, postnasal drip and sore throat.   Eyes: Negative.   Respiratory:  Positive for cough (productive), shortness of breath ("about the same") and wheezing.   Cardiovascular:  Negative for chest pain, palpitations and leg swelling.  Gastrointestinal:  Negative for abdominal distention and abdominal pain.  Endocrine: Negative.   Genitourinary: Negative.   Musculoskeletal:  Positive for arthralgias (left foot). Negative for back pain  and neck pain.  Skin: Negative.   Allergic/Immunologic: Negative.   Neurological:  Negative for dizziness and light-headedness.  Hematological:  Negative for adenopathy. Bruises/bleeds easily.  Psychiatric/Behavioral:  Negative for dysphoric mood and sleep disturbance (sleeping on 2 pillows; oxygen at 2L around the clock; using NIV trilogy with 6L oxygen). The patient is not nervous/anxious.    Wt Readings from Last 3 Encounters:  04/12/22 201 lb 4 oz (91.3 kg)  03/07/22 203 lb 3.2 oz (92.2 kg)  03/01/22 204 lb (92.5 kg)   Today's Vitals   04/12/22 0835 04/12/22 0837  BP: (!) 160/69   Pulse: 89   Resp: 18   SpO2: 94%   Weight: 201 lb 4 oz (91.3 kg)   Height: 5' 6"  (1.676 m)   PainSc: 0-No pain 0-No  pain   Body mass index is 32.48 kg/m.    Lab Results  Component Value Date   CREATININE 1.00 09/21/2021   CREATININE 0.85 04/20/2021   CREATININE 0.91 04/18/2021   Physical Exam Vitals and nursing note reviewed. Exam conducted with a chaperone present (daughter).  Constitutional:      Appearance: Normal appearance.  HENT:     Head: Normocephalic and atraumatic.  Cardiovascular:     Rate and Rhythm: Normal rate and regular rhythm.  Pulmonary:     Effort: Pulmonary effort is normal. No respiratory distress.     Breath sounds: Wheezing (RLL) present. No rales.  Abdominal:     General: There is no distension.     Palpations: Abdomen is soft.     Tenderness: There is no abdominal tenderness.  Musculoskeletal:        General: No tenderness.     Cervical back: Normal range of motion and neck supple.     Right lower leg: Edema (trace pitting) present.     Left lower leg: No tenderness. Edema (trace pitting) present.  Skin:    General: Skin is warm and dry.  Neurological:     General: No focal deficit present.     Mental Status: He is alert and oriented to person, place, and time.  Psychiatric:        Mood and Affect: Mood normal.        Behavior: Behavior normal.         Thought Content: Thought content normal.    Assessment & Plan:  1: Chronic heart failure with preserved ejection fraction without LVH/LAE- - NYHA class III  - euvolemic today - weighing daily; reminded to call for an overnight weight gain of > 3 pounds or a weekly weight gain of > 5 pounds - weight up 3 pounds from last visit 11/09/21  - not adding salt to his food and his daughter rinses any canned foods out; reminded to keep his daily sodium intake to <2097m/ day; using NoSalt for seasoning although doesn't like it, discussed trying the different Mrs DDeliah Bostonseasonings to find one of those that he likes - saw cardiology (Rockey Situ 11/10/21 - BNP 09/21/21 was 63.8  2: HTN- - BP elevated (160/69 ) rechecked at 138/80 - saw PCP (Masoud) 03/07/22 - BMP 09/21/21 viewed and showed sodium 138, potassium 4.9, creatinine 1.00 and GFR >60 - BMP ordered  - Losartan 25 mg sent to pharmacy pending labs & daughter advised to not start it until she hears from our office; if he starts labs, will recheck BMP next visit  3: DM- - A1c 12/06/21 was 8.2%  4: COPD- - wears oxgyen at 2L around the clock, continuous although increases to 4L upon exertion - using NIV trilogy at bedtime as well as during the day and feels like that has made the biggest difference; - sleeping better since starting requip - saw pulmonology (Patsey Berthold 03/01/22   Patient did not bring his medications nor a list. Each medication was verbally reviewed with the patient and he was encouraged to bring the bottles to every visit to confirm accuracy of list.   Return in 1 month, sooner if needed.

## 2022-04-12 NOTE — Patient Instructions (Addendum)
  Continue weighing daily and call for an overnight weight gain of 3 pounds or more or a weekly weight gain of more than 5 pounds.   Sending prescription for Losartan 25 mg to pharmacy. Wait to pick up until receive call from Otila Kluver, NP.   Follow-up 1 month

## 2022-04-12 NOTE — Telephone Encounter (Addendum)
Spoke with patient's daughter to review the lab results and medication change details below. Daughter verbalized understanding and stated they will begin losartan 1 tablet daily, and she had no questions. Instructed to call clinic if any concerns or symptoms arise.  Georg Ruddle, RN ----- Message from Alisa Graff, [place] sent at 04/12/2022 10:36 AM EDT ----- Please call his daughter [name] and tell her his kidney function looks great and to start the losartan as 1 tablet daily as we discussed.

## 2022-05-11 ENCOUNTER — Ambulatory Visit (HOSPITAL_BASED_OUTPATIENT_CLINIC_OR_DEPARTMENT_OTHER): Payer: Medicare HMO | Admitting: Family

## 2022-05-11 ENCOUNTER — Encounter: Payer: Self-pay | Admitting: Family

## 2022-05-11 ENCOUNTER — Other Ambulatory Visit
Admission: RE | Admit: 2022-05-11 | Discharge: 2022-05-11 | Disposition: A | Discharge status: Home / Self Care | Payer: Medicare HMO | Source: Ambulatory Visit | Attending: Family | Admitting: Family

## 2022-05-11 VITALS — BP 146/67 | HR 81 | Resp 16 | Ht 66.0 in | Wt 203.1 lb

## 2022-05-11 DIAGNOSIS — I110 Hypertensive heart disease with heart failure: Secondary | ICD-10-CM | POA: Insufficient documentation

## 2022-05-11 DIAGNOSIS — I5032 Chronic diastolic (congestive) heart failure: Secondary | ICD-10-CM

## 2022-05-11 DIAGNOSIS — J449 Chronic obstructive pulmonary disease, unspecified: Secondary | ICD-10-CM | POA: Diagnosis not present

## 2022-05-11 DIAGNOSIS — I10 Essential (primary) hypertension: Secondary | ICD-10-CM

## 2022-05-11 DIAGNOSIS — E785 Hyperlipidemia, unspecified: Secondary | ICD-10-CM

## 2022-05-11 DIAGNOSIS — E1169 Type 2 diabetes mellitus with other specified complication: Secondary | ICD-10-CM | POA: Diagnosis not present

## 2022-05-11 LAB — BASIC METABOLIC PANEL
Anion gap: 10 (ref 5–15)
BUN: 24 mg/dL — ABNORMAL HIGH (ref 8–23)
CO2: 33 mmol/L — ABNORMAL HIGH (ref 22–32)
Calcium: 9 mg/dL (ref 8.9–10.3)
Chloride: 98 mmol/L (ref 98–111)
Creatinine, Ser: 0.91 mg/dL (ref 0.61–1.24)
GFR, Estimated: >60 mL/min (ref >60)
Glucose, Bld: 192 mg/dL — ABNORMAL HIGH (ref 70–99)
Potassium: 4.8 mmol/L (ref 3.5–5.1)
Sodium: 141 mmol/L (ref 135–145)

## 2022-05-11 NOTE — Patient Instructions (Signed)
Continue weighing daily and call for an overnight weight gain of 3 pounds or more or a weekly weight gain of more than 5 pounds.  °

## 2022-05-11 NOTE — Progress Notes (Signed)
Patient ID: Julian Sparks, male    DOB: [birth year], 76 y.o.   MRN: [id number]  HPI  Ms Volkov is a 76 y/o male with a history of DM, HTN, COPD, previous tobacco use and chronic heart failure.   Echo report from 01/24/21 reviewed and showed an EF of 50-55% with severe RAE but without LVH.   Was in the ED 09/21/21 due to acute on chronic HF with 5 pound weight gain overnight. Given IV lasix with improvement of symptoms and he was released.    He presents today for a follow-up visit due to change in medication from last visit. Losartan was started at last visit and he said at first he was alittle dizzy but it resided and he feels good. He is assisted by his daughter. He denies any headaches, dizziness, chest pain, palpitations, abdominal pain, constipation or diarrhea. He has shortness of breath, fatigue, cough, wheezing & easy bruising along with this. Describes this as being chronic in nature having been present for several years and feels like it's "the same as always".   He does his best to follow a low sodium diet and does not add salt when he cooks. He does not drink sugary drinks and monitors his blood sugar once daily and sugars range between 187-294.    Past Medical History:  Diagnosis Date   CHF (congestive heart failure) (HCC)    Chronic bronchitis (HCC)    COPD (chronic obstructive pulmonary disease) (HCC)    Diabetes mellitus without complication (HCC)    Diastolic dysfunction    Dyspnea    with exertion   Hypertension    Past Surgical History:  Procedure Laterality Date   CIRCUMCISION     SHOULDER ARTHROSCOPY WITH OPEN ROTATOR CUFF REPAIR Left 06/20/2016   Procedure: SHOULDER ARTHROSCOPY WITH OPEN ROTATOR CUFF REPAIR;  Surgeon: Earnestine Leys, MD;  Location: [facility] ORS;  Service: Orthopedics;  Laterality: Left;   UPPER GI ENDOSCOPY     Family History  Problem Relation Age of Onset   Hypertension Mother    Cancer Sister    Diabetes Brother    Social History   Tobacco Use    Smoking status: Former    Packs/day: 2.00    Years: 53.00    Total pack years: 106.00    Types: Cigarettes    Quit date: 2014    Years since quitting: 9.7   Smokeless tobacco: Former    Types: Chew  Substance Use Topics   Alcohol use: No   Allergies  Allergen Reactions   Aspirin Nausea Only and Other (See Comments)    325 mg aspirin cause stomach upset   Penicillins Rash   Prior to Admission medications   Medication Sig Start Date End Date Taking? Authorizing Provider  ACCU-CHEK AVIVA PLUS test strip TEST BLOOD SUGAR TWICE DAILY 12/25/21  Yes Masoud, [name], MD  Accu-Chek Softclix Lancets lancets CHECK BLOOD SUGAR TWO TIMES DAILY 01/01/22  Yes Masoud, [name], MD  albuterol (PROVENTIL) (2.5 MG/3ML) 0.083% nebulizer solution INHALE THE CONTENTS OF 1 VIAL VIA NEBULIZER EVERY 6 HOURS AS NEEDED FOR SHORTNESS OF BREATH OR WHEEZING 03/13/22  Yes Tyler Pita, MD  albuterol (VENTOLIN HFA) 108 (90 Base) MCG/ACT inhaler Inhale 2 puffs into the lungs every 6 (six) hours as needed for wheezing or shortness of breath. 12/06/21  Yes Masoud, [name], MD  Alcohol Swabs (DROPSAFE ALCOHOL PREP) 70 % PADS USE AS DIRECTED 03/09/22  Yes [name], MD  aspirin 81 MG EC tablet  Take 81 mg by mouth daily. Swallow whole.    Yes [provider]  atorvastatin (LIPITOR) 40 MG tablet TAKE 1 TABLET EVERY DAY 03/13/22  Yes Masoud, [name], MD  B Complex-C (SUPER B COMPLEX PO) Take 1 tablet by mouth daily.    Yes [provider]  Blood Glucose Monitoring Suppl (ACCU-CHEK AVIVA PLUS) w/Device KIT Check blood sugar BID 11/01/20  Yes Masoud, [name], MD  Budeson-Glycopyrrol-Formoterol (BREZTRI AEROSPHERE) 160-9-4.8 MCG/ACT AERO Inhale 2 puffs into the lungs in the morning and at bedtime. 12/05/21  Yes Tyler Pita, MD  chlorpheniramine-HYDROcodone [facility] PENNKINETIC ER) 10-8 MG/5ML Take 5 mLs by mouth 2 (two) times daily. 03/07/22  Yes Masoud, [name], MD  Dupilumab (DUPIXENT) 300 MG/2ML SOPN Inject  300 mg into the skin every 14 (fourteen) days. 03/29/22  Yes Tyler Pita, MD  empagliflozin (JARDIANCE) 10 MG TABS tablet TAKE 1 TABLET(10 MG) BY MOUTH DAILY 11/10/21  Yes Gollan, Kathlene November, MD  furosemide (LASIX) 20 MG tablet TAKE 2 TABLETS EVERY DAY 12/25/21  Yes Masoud, [name], MD  gabapentin (NEURONTIN) 300 MG capsule TAKE 1 CAPSULE THREE TIMES DAILY 08/29/21  Yes Masoud, [name], MD  gentamicin cream (GARAMYCIN) 0.1 % Apply 1 Application topically 2 (two) times daily. 04/10/22  Yes Edrick Kins, DPM  glipiZIDE (GLUCOTROL XL) 5 MG 24 hr tablet TAKE 1 TABLET TWICE DAILY 04/09/22  Yes Masoud, [name], MD  losartan (COZAAR) 25 MG tablet Take 1 tablet (25 mg total) by mouth daily. 04/12/22  Yes Hackney, Otila Kluver A, FNP  metFORMIN (GLUCOPHAGE) 500 MG tablet TAKE 1 TABLET TWICE DAILY 03/13/22  Yes Masoud, [name], MD  metolazone (ZAROXOLYN) 5 MG tablet For weight 201 or higher take metolazone 5 mg, then 30 minutes later take lasix 40 mg 06/07/21  Yes Gollan, Kathlene November, MD  metoprolol succinate (TOPROL-XL) 25 MG 24 hr tablet TAKE 1 TABLET(25 MG) BY MOUTH DAILY 11/13/21  Yes Masoud, [name], MD  predniSONE (DELTASONE) 5 MG tablet TAKE 2 TABLETS EVERY DAY WITH BREAKFAST Patient taking differently: Take 5 mg by mouth daily with breakfast. Weaning. Now taking 1 tablet daily 01/04/22  Yes Masoud, [name], MD  Pseudoeph-Doxylamine-DM-APAP (NYQUIL PO) Take by mouth as needed.   Yes [provider]  rOPINIRole (REQUIP) 1 MG tablet Take 1 tablet (1 mg total) by mouth at bedtime. 03/01/22  Yes Tyler Pita, MD  traZODone (DESYREL) 50 MG tablet TAKE 2 TABLETS EVERY DAY 05/08/21  Yes [name], MD     Review of Systems  Constitutional:  Positive for fatigue. Negative for appetite change.  HENT:  Negative for congestion, postnasal drip and sore throat.   Eyes: Negative.   Respiratory:  Positive for cough (productive), shortness of breath ("about the same") and wheezing.   Cardiovascular:  Negative for chest pain,  palpitations and leg swelling.  Gastrointestinal:  Negative for abdominal distention and abdominal pain.  Endocrine: Negative.   Genitourinary: Negative.   Musculoskeletal:  Positive for arthralgias (left foot). Negative for back pain and neck pain.  Skin: Negative.   Allergic/Immunologic: Negative.   Neurological:  Negative for dizziness and light-headedness.  Hematological:  Negative for adenopathy. Bruises/bleeds easily.  Psychiatric/Behavioral:  Negative for dysphoric mood and sleep disturbance (sleeping on 2 pillows; oxygen at 2L around the clock; using NIV trilogy with 6L oxygen). The patient is not nervous/anxious.    Vitals:   05/11/22 0907  BP: (!) 146/67  Pulse: 81  Resp: 16  SpO2: 92%   Wt Readings from Last 3  Encounters:  05/11/22 203 lb 2 oz (92.1 kg)  04/12/22 201 lb 4 oz (91.3 kg)  03/07/22 203 lb 3.2 oz (92.2 kg)     Lab Results  Component Value Date   CREATININE 1.13 04/12/2022   CREATININE 1.00 09/21/2021   CREATININE 0.85 04/20/2021   Physical Exam Vitals and nursing note reviewed. Exam conducted with a chaperone present (daughter).  Constitutional:      Appearance: Normal appearance.  HENT:     Head: Normocephalic and atraumatic.  Cardiovascular:     Rate and Rhythm: Normal rate and regular rhythm.  Pulmonary:     Effort: Pulmonary effort is normal. No respiratory distress.     Breath sounds: No wheezing or rales.  Abdominal:     General: There is no distension.     Palpations: Abdomen is soft.     Tenderness: There is no abdominal tenderness.  Musculoskeletal:        General: No tenderness.     Cervical back: Normal range of motion and neck supple.     Right lower leg: Edema (trace pitting) present.     Left lower leg: No tenderness. Edema (trace pitting) present.  Skin:    General: Skin is warm and dry.     Findings: Erythema (bilateral lower extremities) present.  Neurological:     General: No focal deficit present.     Mental Status: He  is alert and oriented to person, place, and time.  Psychiatric:        Mood and Affect: Mood normal.        Behavior: Behavior normal.        Thought Content: Thought content normal.    Assessment & Plan:  1: Chronic heart failure with preserved ejection fraction without LVH/LAE- - NYHA class III  - euvolemic today - weighing daily; reminded to call for an overnight weight gain of > 3 pounds or a weekly weight gain of > 5 pounds - weight up 2 pounds since last visit on 04/12/22 - not adding salt to his food and his daughter rinses any canned foods out; reminded to keep his daily sodium intake to <2035m/ day; using NoSalt for seasoning although doesn't like it, discussed trying the different Mrs DDeliah Bostonseasonings to find one of those that he likes - saw cardiology (Rockey Situ 11/10/21 - BNP 09/21/21 was 63.8  2: HTN- - BP mildly elevated (146/67) although improved from last visit - saw PCP (Masoud) 03/07/22 - BMP 04/12/22 viewed and showed sodium 138, potassium 4.3, creatinine 1.13 and GFR >60 - Ordered BMP today   3: DM- - A1c 12/06/21 was 8.2% - checks blood sugars at home once daily this AM 172  4: COPD- - wears oxgyen at 2L around the clock, continuous although increases to 4L upon exertion - using NIV trilogy at bedtime as well as during the day and feels like that has made the biggest difference; - sleeping better since starting requip - saw pulmonology (Patsey Berthold 03/01/22   Patient did not bring his medications nor a list. Each medication was verbally reviewed with the patient and he was encouraged to bring the bottles to every visit to confirm accuracy of list.   Return in 6 months, sooner if needed.

## 2022-05-24 NOTE — Progress Notes (Signed)
Cardiology Office Note  Date:  05/25/2022   ID:  [name], DOB 09-09-1945, MRN [id number]  PCP:  [name], MD   Chief Complaint  Patient presents with   6 month follow up     "Doing well." Medications reviewed by the patient verbally.     HPI:  Mr. Nihar Klus is a 76 year old gentleman with history of  obesity,  COPD, on home oxygen 2 L,  diabetes type 2,  hypertension,  presenting to the hospital June 2022 with worsening shortness of breath, hypoxia, leg swelling concerning for CHF Who presents for follow-up of his diastolic CHF  Last seen in office by myself March 2023 In follow-up today presents with family members He reports that he feels well,  on chronic oxygen 2 to 4 L Does not go outside the house much, occasionally goes shopping  Consistent with his Lasix twice a day Weight stable Rare metolazone 2-3 times Weight <200, avg 195-196 pounds at home Stays thirsty, high fluid intake Followed by CHF clinic  Lab work reviewed A1C 8.2 Reports having some high carbohydrate foods  No recent COPD exacerbation No chest pain concerning for angina  EKG personally reviewed by myself on todays visit Normal sinus rhythm with rate 88 bpm left axis deviation, unable to exclude old inferior MI  Past medical history reviewed Seen in the emergency room September 21, 2021 for shortness of breath Chest x-ray mild edema, given IV Lasix and DuoNeb and released It was recommended he take Lasix twice a day and metolazone as needed for weight gain  Last seen in clinic July 2022 Seen by CHF clinic 1 month ago  Prior notes reviewed, in the hospital June 2022 with Severe ACUTE Hypoxic and Hypercapnic Respiratory Failure due to pneumonia, acute severe decompensated combined systolic and diastolic heart failure on BiPAP Streptococcus Agalactiae pneumonia. Hemoptysis Lymphoma.  CT chest Substantial increase in bulky celiac and retroperitoneal adenopathy compared to the  03/06/2019 PET-CT.  Appearance compatible with limb for proliferative process.  hospital 03/2021 daughter checked his pulse oximetry on his 4 L of oxygen and his pulse oximetry was between 75 and 85%. Patient went to the heart failure clinic and was also noted to be hypoxic with pulse oximetry in the 70s on his 4 L of oxygen  [place] at d/c 197 pounds,   On lasix 40 daily at d/c Weight today at home 199 pounds On prednisone leaving hospital, now complete  Does nebulizers 4 times a day, very congested on today's visit  Echocardiogram Jan 24, 2021  1. Left ventricular ejection fraction, by estimation, is 50 to 55%. The  left ventricle has low normal function. Hypokinesis of septal wall. Left  ventricular diastolic parameters are consistent with Grade II diastolic  dysfunction (pseudonormalization).   2. Right ventricular systolic function is moderately reduced. The right  ventricular size is severely enlarged. Tricuspid regurgitation signal is  inadequate for assessing PA pressure.   3. Right atrial size was mildly dilated.     PMH:   has a past medical history of CHF (congestive heart failure) (HCC), Chronic bronchitis ([place]), COPD (chronic obstructive pulmonary disease) ([place]), Diabetes mellitus without complication ([place]), Diastolic dysfunction, Dyspnea, and Hypertension.  PSH:    Past Surgical History:  Procedure Laterality Date   CIRCUMCISION     SHOULDER ARTHROSCOPY WITH OPEN ROTATOR CUFF REPAIR Left 06/20/2016   Procedure: SHOULDER ARTHROSCOPY WITH OPEN ROTATOR CUFF REPAIR;  Surgeon: Earnestine Leys, MD;  Location: [facility] ORS;  Service: Orthopedics;  Laterality:  Left;   UPPER GI ENDOSCOPY      Current Outpatient Medications  Medication Sig Dispense Refill   ACCU-CHEK AVIVA PLUS test strip TEST BLOOD SUGAR TWICE DAILY 200 strip 4   Accu-Chek Softclix Lancets lancets CHECK BLOOD SUGAR TWO TIMES DAILY 200 each 4   albuterol (PROVENTIL) (2.5 MG/3ML) 0.083% nebulizer solution INHALE  THE CONTENTS OF 1 VIAL VIA NEBULIZER EVERY 6 HOURS AS NEEDED FOR SHORTNESS OF BREATH OR WHEEZING 540 mL 2   albuterol (VENTOLIN HFA) 108 (90 Base) MCG/ACT inhaler Inhale 2 puffs into the lungs every 6 (six) hours as needed for wheezing or shortness of breath. 18 g 3   Alcohol Swabs (DROPSAFE ALCOHOL PREP) 70 % PADS USE AS DIRECTED 300 each 3   aspirin 81 MG EC tablet Take 81 mg by mouth daily. Swallow whole.      atorvastatin (LIPITOR) 40 MG tablet TAKE 1 TABLET EVERY DAY 90 tablet 3   B Complex-C (SUPER B COMPLEX PO) Take 1 tablet by mouth daily.      Blood Glucose Monitoring Suppl (ACCU-CHEK AVIVA PLUS) w/Device KIT Check blood sugar BID 1 kit 3   Budeson-Glycopyrrol-Formoterol (BREZTRI AEROSPHERE) 160-9-4.8 MCG/ACT AERO Inhale 2 puffs into the lungs in the morning and at bedtime. 32.1 g 3   chlorpheniramine-HYDROcodone (TUSSIONEX PENNKINETIC ER) 10-8 MG/5ML Take 5 mLs by mouth 2 (two) times daily. 115 mL 0   Dupilumab (DUPIXENT) 300 MG/2ML SOPN Inject 300 mg into the skin every 14 (fourteen) days. 12 mL 1   empagliflozin (JARDIANCE) 10 MG TABS tablet TAKE 1 TABLET(10 MG) BY MOUTH DAILY 90 tablet 3   furosemide (LASIX) 20 MG tablet TAKE 2 TABLETS EVERY DAY 180 tablet 3   gabapentin (NEURONTIN) 300 MG capsule TAKE 1 CAPSULE THREE TIMES DAILY 270 capsule 3   gentamicin cream (GARAMYCIN) 0.1 % Apply 1 Application topically 2 (two) times daily. 30 g 1   glipiZIDE (GLUCOTROL XL) 5 MG 24 hr tablet TAKE 1 TABLET TWICE DAILY 180 tablet 1   losartan (COZAAR) 25 MG tablet Take 1 tablet (25 mg total) by mouth daily. 30 tablet 3   metFORMIN (GLUCOPHAGE) 500 MG tablet TAKE 1 TABLET TWICE DAILY 180 tablet 3   metolazone (ZAROXOLYN) 5 MG tablet For weight 201 or higher take metolazone 5 mg, then 30 minutes later take lasix 40 mg 90 tablet 0   metoprolol succinate (TOPROL-XL) 25 MG 24 hr tablet TAKE 1 TABLET(25 MG) BY MOUTH DAILY 90 tablet 3   predniSONE (DELTASONE) 5 MG tablet TAKE 2 TABLETS EVERY DAY WITH  BREAKFAST (Patient taking differently: Take 5 mg by mouth daily with breakfast. Weaning. Now taking 1 tablet daily) 120 tablet 4   Pseudoeph-Doxylamine-DM-APAP (NYQUIL PO) Take by mouth as needed.     rOPINIRole (REQUIP) 1 MG tablet Take 1 tablet (1 mg total) by mouth at bedtime. 30 tablet 6   traZODone (DESYREL) 50 MG tablet TAKE 2 TABLETS EVERY DAY 180 tablet 3   No current facility-administered medications for this visit.     Allergies:   Aspirin and Penicillins   Social History:  The patient  reports that he quit smoking about 9 years ago. His smoking use included cigarettes. He has a 106.00 pack-year smoking history. He has quit using smokeless tobacco.  His smokeless tobacco use included chew. He reports that he does not drink alcohol and does not use drugs.   Family History:   family history includes Cancer in his sister; Diabetes in his brother; Hypertension in  his mother.    Review of Systems: Review of Systems  Constitutional: Negative.   HENT: Negative.    Respiratory:  Positive for shortness of breath.   Cardiovascular:  Positive for leg swelling.  Gastrointestinal: Negative.   Musculoskeletal: Negative.   Neurological: Negative.   Psychiatric/Behavioral: Negative.    All other systems reviewed and are negative.    PHYSICAL EXAM: VS:  BP 120/60 (BP Location: Left Arm, Patient Position: Sitting, Cuff Size: Normal)   Pulse 88   Ht 5' 6"  (1.676 m)   Wt 202 lb (91.6 kg)   SpO2 94% Comment: 2 Liters of oxygen  BMI 32.60 kg/m  , BMI Body mass index is 32.6 kg/m. Constitutional:  oriented to person, place, and time. No distress.  HENT:  Head: Grossly normal Eyes:  no discharge. No scleral icterus.  Neck: No JVD, no carotid bruits  Cardiovascular: Regular rate and rhythm, no murmurs appreciated Pulmonary/Chest: Mildly decreased breath sounds throughout, scattered Rales Abdominal: Soft.  no distension.  no tenderness.  Musculoskeletal: Normal range of  motion Neurological:  normal muscle tone. Coordination normal. No atrophy Skin: Skin warm and dry Psychiatric: normal affect, pleasant  Recent Labs: 09/21/2021: B Natriuretic Peptide 63.8; Hemoglobin 14.0; Platelets 182 05/11/2022: BUN 24; Creatinine, Ser 0.91; Potassium 4.8; Sodium 141    Lipid Panel Lab Results  Component Value Date   CHOL 101 01/26/2021   HDL 46 01/26/2021   LDLCALC 36 01/26/2021   TRIG 97 01/26/2021    Wt Readings from Last 3 Encounters:  05/25/22 202 lb (91.6 kg)  05/11/22 203 lb 2 oz (92.1 kg)  04/12/22 201 lb 4 oz (91.3 kg)     ASSESSMENT AND PLAN:  Problem List Items Addressed This Visit       Cardiology Problems   Essential hypertension   Relevant Orders   EKG 12-Lead   Type 2 diabetes mellitus with hyperlipidemia (HCC)   Relevant Orders   EKG 12-Lead   Paroxysmal atrial tachycardia (HCC)   Acute cor pulmonale (HCC)     Other   Edema   Other Visit Diagnoses     Chronic diastolic congestive heart failure ([place])    -  Primary   Relevant Orders   EKG 12-Lead   Chronic obstructive pulmonary disease, unspecified COPD type ([place])       Relevant Orders   EKG 12-Lead   Type 2 diabetes mellitus without complication, without long-term current use of insulin (HCC)       Obstructive sleep apnea syndrome       Lymphoma, unspecified body region, unspecified lymphoma type ([place])         Chronic diastolic CHF Lasix 40 twice daily, stable weight, metolazone sparingly for weight over 200 On average weight at home 195-196 Blood pressure stable, no changes made to his medications Appears euvolemic, renal function stable  COPD exacerbation Prior hospitalizations, no recent COPD exacerbation  Hyperlipidemia Cholesterol is at goal on the current lipid regimen. No changes to the medications were made.   Total encounter time more than 30 minutes  Greater than 50% was spent in counseling and coordination of care with the patient    Signed, Esmond Plants, M.D., Ph.D. [place], [place]

## 2022-05-25 ENCOUNTER — Ambulatory Visit: Payer: Medicare HMO | Attending: Cardiovascular Disease | Admitting: Cardiovascular Disease

## 2022-05-25 ENCOUNTER — Encounter: Payer: Self-pay | Admitting: Cardiovascular Disease

## 2022-05-25 VITALS — BP 120/60 | HR 88 | Ht 66.0 in | Wt 202.0 lb

## 2022-05-25 DIAGNOSIS — J449 Chronic obstructive pulmonary disease, unspecified: Secondary | ICD-10-CM

## 2022-05-25 DIAGNOSIS — R609 Edema, unspecified: Secondary | ICD-10-CM | POA: Diagnosis not present

## 2022-05-25 DIAGNOSIS — I2609 Other pulmonary embolism with acute cor pulmonale: Secondary | ICD-10-CM | POA: Diagnosis not present

## 2022-05-25 DIAGNOSIS — I471 Supraventricular tachycardia: Secondary | ICD-10-CM

## 2022-05-25 DIAGNOSIS — I10 Essential (primary) hypertension: Secondary | ICD-10-CM

## 2022-05-25 DIAGNOSIS — I5032 Chronic diastolic (congestive) heart failure: Secondary | ICD-10-CM | POA: Diagnosis not present

## 2022-05-25 DIAGNOSIS — E1169 Type 2 diabetes mellitus with other specified complication: Secondary | ICD-10-CM

## 2022-05-25 DIAGNOSIS — E119 Type 2 diabetes mellitus without complications: Secondary | ICD-10-CM

## 2022-05-25 DIAGNOSIS — E785 Hyperlipidemia, unspecified: Secondary | ICD-10-CM

## 2022-05-25 DIAGNOSIS — C8590 Non-Hodgkin lymphoma, unspecified, unspecified site: Secondary | ICD-10-CM

## 2022-05-25 DIAGNOSIS — G4733 Obstructive sleep apnea (adult) (pediatric): Secondary | ICD-10-CM | POA: Diagnosis not present

## 2022-05-25 NOTE — Patient Instructions (Signed)
Medication Instructions:  No changes  If you need a refill on your cardiac medications before your next appointment, please call your pharmacy.   Lab work: No new labs needed  Testing/Procedures: No new testing needed  Follow-Up: At [facility], you and your health needs are our priority.  As part of our continuing mission to provide you with exceptional heart care, we have created designated Provider Care Teams.  These Care Teams include your primary Cardiologist (physician) and Advanced Practice Providers (APPs -  Physician Assistants and Nurse Practitioners) who all work together to provide you with the care you need, when you need it.  You will need a follow up appointment in 6 months, APP ok  Providers on your designated Care Team:   Murray Hodgkins, NP Christell Faith, PA-C Cadence Kathlen Mody, [place]  COVID-19 Vaccine Information can be found at: [email or web address] For questions related to vaccine distribution or appointments, please email [email or web address] or call [phone].

## 2022-05-31 ENCOUNTER — Ambulatory Visit: Payer: Medicare HMO | Admitting: Pulmonary Disease

## 2022-05-31 ENCOUNTER — Encounter: Payer: Self-pay | Admitting: Pulmonary Disease

## 2022-05-31 VITALS — BP 140/80 | HR 81 | Temp 98.2°F | Ht 66.0 in | Wt 205.2 lb

## 2022-05-31 DIAGNOSIS — I5032 Chronic diastolic (congestive) heart failure: Secondary | ICD-10-CM | POA: Diagnosis not present

## 2022-05-31 DIAGNOSIS — I2781 Cor pulmonale (chronic): Secondary | ICD-10-CM | POA: Diagnosis not present

## 2022-05-31 DIAGNOSIS — J209 Acute bronchitis, unspecified: Secondary | ICD-10-CM | POA: Diagnosis not present

## 2022-05-31 DIAGNOSIS — J449 Chronic obstructive pulmonary disease, unspecified: Secondary | ICD-10-CM | POA: Diagnosis not present

## 2022-05-31 DIAGNOSIS — G4733 Obstructive sleep apnea (adult) (pediatric): Secondary | ICD-10-CM

## 2022-05-31 DIAGNOSIS — J4550 Severe persistent asthma, uncomplicated: Secondary | ICD-10-CM

## 2022-05-31 DIAGNOSIS — J440 Chronic obstructive pulmonary disease with acute lower respiratory infection: Secondary | ICD-10-CM | POA: Diagnosis not present

## 2022-05-31 DIAGNOSIS — J9611 Chronic respiratory failure with hypoxia: Secondary | ICD-10-CM

## 2022-05-31 DIAGNOSIS — J9612 Chronic respiratory failure with hypercapnia: Secondary | ICD-10-CM | POA: Diagnosis not present

## 2022-05-31 MED ORDER — DOXYCYCLINE HYCLATE 100 MG PO TABS: 100.0000 mg | ORAL_TABLET | Freq: Two times a day (BID) | ORAL | 0 refills | Status: AC

## 2022-05-31 NOTE — Progress Notes (Signed)
Subjective:    Patient ID: Julian Sparks, male    DOB: 1946-05-15, 76 y.o.   MRN: [id number] Patient Care Team: Corky Downs, MD as PCP - General (Internal Medicine) Antonieta Iba, MD as PCP - Cardiology (Cardiology) Creig Hines, MD as Consulting Physician (Hematology and Oncology)  Chief Complaint  Patient presents with   Follow-up    COPD. Congestion with light green sputum for 3-4 days. SOB normal. 2L of O2 at rest. 4L of O2 with exertion.   HPI Patient is a 76 year old former smoker (106 PY) who presents for scheduled follow-up on severe COPD and chronic respiratory failure with hypoxia.  He was last evaluated on 01 March 2022. He is chronically on 2 to 4 L of oxygen during the day and uses a Trilogy ventilator at night. He has very prominent asthmatic component. He is currently on prednisone 5 mg as well as Breztri and as needed albuterol.  Since his last visit he has continued Dupixent and is tolerating this medication well.  He has not had a major exacerbation since starting Dupixent.  Prior to starting Dupixent we had had to increase his prednisone to 10 mg as noted above due to increased shortness of breath, however, he has now been tapered down to 5 mg of prednisone daily and will continue to wean off.  He has been on Requip for his restless legs.  Since starting that medication has been able to get restorative sleep and does not complain of "runny legs".  Has noted change in his sputum to light green over the last 3 to 4 days but no major increase in his shortness of breath.  No fevers, chills or sweats.  No other symptomatology endorsed.    DATA: 04/16/2014 PFT's [facility]): FEV1 1.02 L or 35% predicted.  FVC reported at 67.4% predicted.  Small airways component FEF 25-75% at 13.9%.  DLCO 59%.  These are the only numbers available on record 01/24/2021 echocardiogram: LVEF 50 to 55%, hypokinesis of septal wall, grade 2 DD, RV function moderately reduced, RV size enlarged.  Right  atrial size dilated.  No valvular abnormalities 04/06/2021 sleep study: Obstructive sleep apnea AHI 20.8, baseline oxygen saturation remained below 90% even with supplemental oxygen 05/09/2021 hypersensitivity pneumonitis ([employer]) panel: Normal 06/28/2021, allergen panel/IgE: IgE 507, class II sensitivities to dust mites, molds and trees. 07/28/2021 PFTs: FEV1 0.75 L or 29% predicted, FVC 1.67 L or 47% predicted, FEV1/FVC 45%. No significant bronchodilator response lung volumes show significant air trapping.  There is small airways component.  Diffusion capacity severely reduced.  This represents a decline from his 22 PFTs    Review of Systems A 10 point review of systems was performed and it is as noted above otherwise negative.  Patient Active Problem List   Diagnosis Date Noted   Pneumonia of both lungs due to infectious organism    COPD with acute exacerbation (HCC)    OSA (obstructive sleep apnea) 04/11/2021   Ulcer of left foot (HCC) 03/08/2021   Paroxysmal atrial tachycardia    Metabolic alkalosis 02/10/2021   Acute metabolic encephalopathy 02/10/2021   Lymphoma, small lymphocytic (HCC) 02/04/2021   Acute cor pulmonale (HCC)    Acute on chronic congestive heart failure (HCC)    Acute on chronic diastolic CHF (congestive heart failure) (HCC) 01/24/2021   Type 2 diabetes mellitus with hyperlipidemia (HCC) 01/24/2021   Acute on chronic respiratory failure (HCC) 01/24/2021   Hyperkalemia 01/24/2021   Obesity (BMI 30-39.9) 01/24/2021  Shortness of breath 10/26/2020   Essential hypertension 07/13/2020   Cellulitis of right lower extremity 07/13/2020   Need for influenza vaccination 05/11/2020   Chronic respiratory failure with hypoxia (HCC) 04/27/2020   Bronchitis 04/27/2020   Pleurodynia 04/27/2020   Obesity (BMI 30.0-34.9) 02/17/2020   Diabetes 1.5, managed as type 2 (HCC) 02/17/2020   Adhesive capsulitis of both shoulders 02/17/2020   Edema 07/19/2019   Leg  swelling 07/19/2019   Diminished pulses in lower extremity 07/19/2019   Bursitis of shoulder 04/03/2019   Respiratory failure (HCC) 02/04/2019   Full thickness rotator cuff tear 05/23/2016   Social History   Tobacco Use   Smoking status: Former    Packs/day: 2.00    Years: 53.00    Total pack years: 106.00    Types: Cigarettes    Quit date: 2014    Years since quitting: 9.7   Smokeless tobacco: Former    Types: Chew  Substance Use Topics   Alcohol use: No   Allergies  Allergen Reactions   Aspirin Nausea Only and Other (See Comments)    325 mg aspirin cause stomach upset   Penicillins Rash and Other (See Comments)   Current Meds  Medication Sig   ACCU-CHEK AVIVA PLUS test strip TEST BLOOD SUGAR TWICE DAILY   Accu-Chek Softclix Lancets lancets CHECK BLOOD SUGAR TWO TIMES DAILY   albuterol (PROVENTIL) (2.5 MG/3ML) 0.083% nebulizer solution INHALE THE CONTENTS OF 1 VIAL VIA NEBULIZER EVERY 6 HOURS AS NEEDED FOR SHORTNESS OF BREATH OR WHEEZING   albuterol (VENTOLIN HFA) 108 (90 Base) MCG/ACT inhaler Inhale 2 puffs into the lungs every 6 (six) hours as needed for wheezing or shortness of breath.   Alcohol Swabs (DROPSAFE ALCOHOL PREP) 70 % PADS USE AS DIRECTED   aspirin 81 MG EC tablet Take 81 mg by mouth daily. Swallow whole.    atorvastatin (LIPITOR) 40 MG tablet TAKE 1 TABLET EVERY DAY   B Complex-C (SUPER B COMPLEX PO) Take 1 tablet by mouth daily.    Blood Glucose Monitoring Suppl (ACCU-CHEK AVIVA PLUS) w/Device KIT Check blood sugar BID   Budeson-Glycopyrrol-Formoterol (BREZTRI AEROSPHERE) 160-9-4.8 MCG/ACT AERO Inhale 2 puffs into the lungs in the morning and at bedtime.   chlorpheniramine-HYDROcodone (TUSSIONEX PENNKINETIC ER) 10-8 MG/5ML Take 5 mLs by mouth 2 (two) times daily.   Dupilumab (DUPIXENT) 300 MG/2ML SOPN Inject 300 mg into the skin every 14 (fourteen) days.   empagliflozin (JARDIANCE) 10 MG TABS tablet TAKE 1 TABLET(10 MG) BY MOUTH DAILY   furosemide (LASIX)  20 MG tablet TAKE 2 TABLETS EVERY DAY   gabapentin (NEURONTIN) 300 MG capsule TAKE 1 CAPSULE THREE TIMES DAILY   gentamicin cream (GARAMYCIN) 0.1 % Apply 1 Application topically 2 (two) times daily.   glipiZIDE (GLUCOTROL XL) 5 MG 24 hr tablet TAKE 1 TABLET TWICE DAILY   losartan (COZAAR) 25 MG tablet Take 1 tablet (25 mg total) by mouth daily.   metFORMIN (GLUCOPHAGE) 500 MG tablet TAKE 1 TABLET TWICE DAILY   metolazone (ZAROXOLYN) 5 MG tablet For weight 201 or higher take metolazone 5 mg, then 30 minutes later take lasix 40 mg   metoprolol succinate (TOPROL-XL) 25 MG 24 hr tablet TAKE 1 TABLET(25 MG) BY MOUTH DAILY   predniSONE (DELTASONE) 5 MG tablet TAKE 2 TABLETS EVERY DAY WITH BREAKFAST (Patient taking differently: Take 5 mg by mouth daily with breakfast. Weaning. Now taking 1 tablet daily)   Pseudoeph-Doxylamine-DM-APAP (NYQUIL PO) Take by mouth as needed.   rOPINIRole (REQUIP) 1  MG tablet Take 1 tablet (1 mg total) by mouth at bedtime.   traZODone (DESYREL) 50 MG tablet TAKE 2 TABLETS EVERY DAY   Immunization History  Administered Date(s) Administered   Fluad Quad(high Dose 65+) 05/11/2020, 05/31/2021   Influenza,inj,quad, With Preservative 06/27/2016   PFIZER(Purple Top)SARS-COV-2 Vaccination 11/23/2019, 12/16/2019   Pneumococcal Conjugate-13 04/16/2014   Pneumococcal-Unspecified 05/23/2014   Zoster Recombinat (Shingrix) 01/24/2015       Objective:   Physical Exam BP (!) 140/80 (BP Location: Left Arm, Cuff Size: Normal)   Pulse 81   Temp 98.2 F (36.8 C)   Ht 5\' 6"  (1.676 m)   Wt 205 lb 3.2 oz (93.1 kg)   SpO2 91%   BMI 33.12 kg/m  GENERAL: Overweight gentleman, no acute distress.  Looks refreshed.  Presents in transport chair. Comfortable on 2 l/min nasal cannula O2 saturating 90%.  No use of accessories.  No plethora. HEAD: Normocephalic, atraumatic.  EYES: Pupils equal, round, reactive to light.  No scleral icterus.  MOUTH: Oral mucosa moist, no thrush. NECK:  Supple. No thyromegaly. Trachea midline. No JVD.  No adenopathy. PULMONARY: Good air entry bilaterally.  Coarse breath sounds throughout otherwise no adventitious sounds. CARDIOVASCULAR: S1 and S2. Regular rate and rhythm.  No rubs, murmurs or gallops heard.   ABDOMEN: Protuberant, soft, benign. MUSCULOSKELETAL: No joint deformity, no clubbing, no edema.  NEUROLOGIC: No overt focal deficit. SKIN: Intact,warm,dry.  Psoriatic changes vs. onychomycosis on the nails on left hand. PSYCH: Mood and behavior normal.  He is actually very conversant and jovial today.      Assessment & Plan:     ICD-10-CM   1. Stage 3 severe COPD by GOLD classification (HCC)  J44.9    Continue. Breztri 2 puffs twice a day Continue as needed albuterol    2. Severe persistent asthma, uncomplicated  J45.50    Continue Dupixent Continue prednisone 5 mg daily    3. Acute bronchitis with COPD (HCC)  J44.0    J20.9    Doxycycline 100 mg twice daily Mucinex maximum strength twice daily as needed    4. Chronic respiratory failure with hypoxia and hypercapnia (HCC)  J96.11    J96.12    Continue oxygen at 2 L/min with rest and sleep 4 L/min with exertion    5. Chronic diastolic heart failure (HCC)  Z12.45    This issue adds complexity to his management Follows with cardiology    6. Cor pulmonale, chronic (HCC)  I27.81    Continue oxygen supplementation Continue management of COPD    7. OSA (obstructive sleep apnea)  G47.33    Continue Trilogy vent Await download     Meds ordered this encounter  Medications   doxycycline (VIBRA-TABS) 100 MG tablet    Sig: Take 1 tablet (100 mg total) by mouth 2 (two) times daily for 7 days.    Dispense:  14 tablet    Refill:  0   Will see the patient in follow-up in 3 months time he is to contact us prior to that time should any new difficulties arise.  Gailen Shelter, MD Advanced Bronchoscopy PCCM [facility]    *This note was dictated using  voice recognition software/Dragon.  Despite best efforts to proofread, errors can occur which can change the meaning. Any transcriptional errors that result from this process are unintentional and may not be fully corrected at the time of dictation.

## 2022-05-31 NOTE — Patient Instructions (Signed)
The compliance record on your trilogy machine shows that you have not been very compliant with it.  Is the reason why [employer] may pull it out.  Need to be more consistent with using it.  You did not have any wheezing today which was very good.  I have sent in an antibiotic for you called doxycycline.  Be careful when going out in the sunlight prolonged periods of time with this medication and use sunscreen and protect yourself as there can be some photosensitivity.  Medication is twice a day for 7 days.  Use your flutter valve after you use the nebulizer to help loosen up mucus.  May use Mucinex maximum strength twice a day to help chest congestion and mucus in your lungs.  Continue taking your shots twice a month.  We will see you in follow-up in 3 months time call sooner should any new problems arise.

## 2022-06-06 ENCOUNTER — Other Ambulatory Visit: Payer: Self-pay | Admitting: Internal Medicine

## 2022-06-13 ENCOUNTER — Other Ambulatory Visit: Payer: Self-pay | Admitting: *Deleted

## 2022-06-13 ENCOUNTER — Ambulatory Visit: Payer: Medicare HMO | Admitting: Internal Medicine

## 2022-06-13 MED ORDER — GABAPENTIN 300 MG PO CAPS: ORAL_CAPSULE | ORAL | 3 refills | Status: DC

## 2022-06-16 ENCOUNTER — Other Ambulatory Visit: Payer: Self-pay | Admitting: Internal Medicine

## 2022-06-20 ENCOUNTER — Ambulatory Visit (INDEPENDENT_AMBULATORY_CARE_PROVIDER_SITE_OTHER): Payer: Medicare HMO | Admitting: Internal Medicine

## 2022-06-20 ENCOUNTER — Encounter: Payer: Self-pay | Admitting: Internal Medicine

## 2022-06-20 VITALS — BP 147/74 | HR 80 | Ht 66.0 in

## 2022-06-20 DIAGNOSIS — E119 Type 2 diabetes mellitus without complications: Secondary | ICD-10-CM

## 2022-06-20 DIAGNOSIS — Z23 Encounter for immunization: Secondary | ICD-10-CM

## 2022-06-20 DIAGNOSIS — Z0000 Encounter for general adult medical examination without abnormal findings: Secondary | ICD-10-CM | POA: Diagnosis not present

## 2022-06-20 NOTE — Progress Notes (Signed)
Subjective:   Julian Sparks is a 76 y.o. male who presents for Medicare Annual/Subsequent preventive examination.   I discussed the limitations of evaluation and management by telemedicine and the availability of in person appointments. Patient expressed understanding and agreed to proceed.   Visit performed using audio  Patient:in office  Provider:in office    Review of Systems    Defer to provider  Cardiac Risk Factors include: advanced age (>97mn, >>16women);diabetes mellitus;male gender;hypertension     Objective:    Today's Vitals   06/20/22 0927 06/20/22 0937  BP: (!) 154/77 (!) 147/74  Pulse: 80   SpO2: 99%   Height: 5' 6" (1.676 m)    Body mass index is 33.12 kg/m.     09/21/2021    9:48 AM 05/05/2021   10:24 AM 04/13/2021   11:57 PM 04/13/2021   10:37 AM 01/25/2021   12:27 AM 01/24/2021   11:01 AM 04/03/2019   11:01 AM  Advanced Directives  Does Patient Have a Medical Advance Directive? _0  No No  Would patient like information on creating a medical advance directive? No - Patient declined No - Patient declined No - Patient declined No - Patient declined No - Patient declined No - Patient declined Yes (MAU/Ambulatory/Procedural Areas - Information given)    Current Medications (verified) Outpatient Encounter Medications as of 06/20/2022  Medication Sig   ACCU-CHEK AVIVA PLUS test strip TEST BLOOD SUGAR TWICE DAILY   Accu-Chek Softclix Lancets lancets CHECK BLOOD SUGAR TWO TIMES DAILY   albuterol (PROVENTIL) (2.5 MG/3ML) 0.083% nebulizer solution INHALE THE CONTENTS OF 1 VIAL VIA NEBULIZER EVERY 6 HOURS AS NEEDED FOR SHORTNESS OF BREATH OR WHEEZING   albuterol (VENTOLIN HFA) 108 (90 Base) MCG/ACT inhaler Inhale 2 puffs into the lungs every 6 (six) hours as needed for wheezing or shortness of breath.   Alcohol Swabs (DROPSAFE ALCOHOL PREP) 70 % PADS USE AS DIRECTED   aspirin 81 MG EC tablet Take 81 mg by mouth daily. Swallow whole.    atorvastatin  (LIPITOR) 40 MG tablet TAKE 1 TABLET EVERY DAY   B Complex-C (SUPER B COMPLEX PO) Take 1 tablet by mouth daily.    Blood Glucose Monitoring Suppl (ACCU-CHEK AVIVA PLUS) w/Device KIT Check blood sugar BID   Budeson-Glycopyrrol-Formoterol (BREZTRI AEROSPHERE) 160-9-4.8 MCG/ACT AERO Inhale 2 puffs into the lungs in the morning and at bedtime.   chlorpheniramine-HYDROcodone (TUSSIONEX PENNKINETIC ER) 10-8 MG/5ML Take 5 mLs by mouth 2 (two) times daily.   Dupilumab (DUPIXENT) 300 MG/2ML SOPN Inject 300 mg into the skin every 14 (fourteen) days.   empagliflozin (JARDIANCE) 10 MG TABS tablet TAKE 1 TABLET(10 MG) BY MOUTH DAILY   furosemide (LASIX) 20 MG tablet TAKE 2 TABLETS EVERY DAY   gabapentin (NEURONTIN) 300 MG capsule TAKE 1 CAPSULE THREE TIMES DAILY   gentamicin cream (GARAMYCIN) 0.1 % Apply 1 Application topically 2 (two) times daily.   glipiZIDE (GLUCOTROL XL) 5 MG 24 hr tablet TAKE 1 TABLET TWICE DAILY   losartan (COZAAR) 25 MG tablet Take 1 tablet (25 mg total) by mouth daily.   metFORMIN (GLUCOPHAGE) 500 MG tablet TAKE 1 TABLET TWICE DAILY   metolazone (ZAROXOLYN) 5 MG tablet For weight 201 or higher take metolazone 5 mg, then 30 minutes later take lasix 40 mg   metoprolol succinate (TOPROL-XL) 25 MG 24 hr tablet TAKE 1 TABLET(25 MG) BY MOUTH DAILY   predniSONE (DELTASONE) 5 MG tablet TAKE 2 TABLETS EVERY DAY WITH BREAKFAST (Patient  taking differently: Take 5 mg by mouth daily with breakfast. Weaning. Now taking 1 tablet daily)   Pseudoeph-Doxylamine-DM-APAP (NYQUIL PO) Take by mouth as needed.   rOPINIRole (REQUIP) 1 MG tablet Take 1 tablet (1 mg total) by mouth at bedtime.   traZODone (DESYREL) 50 MG tablet TAKE 2 TABLETS EVERY DAY   No facility-administered encounter medications on file as of 06/20/2022.    Allergies (verified) Aspirin and Penicillins   History: Past Medical History:  Diagnosis Date   CHF (congestive heart failure) (HCC)    Chronic bronchitis (HCC)    COPD  (chronic obstructive pulmonary disease) (HCC)    Diabetes mellitus without complication (HCC)    Diastolic dysfunction    Dyspnea    with exertion   Hypertension    Past Surgical History:  Procedure Laterality Date   CIRCUMCISION     SHOULDER ARTHROSCOPY WITH OPEN ROTATOR CUFF REPAIR Left 06/20/2016   Procedure: SHOULDER ARTHROSCOPY WITH OPEN ROTATOR CUFF REPAIR;  Surgeon: Earnestine Leys, MD;  Location: [facility] ORS;  Service: Orthopedics;  Laterality: Left;   UPPER GI ENDOSCOPY     Family History  Problem Relation Age of Onset   Hypertension Mother    Cancer Sister    Diabetes Brother    Social History   Socioeconomic History   Marital status: Widowed    Spouse name: Not on file   Number of children: 2   Years of education: 9   Highest education level: Some college, no degree  Occupational History   Not on file  Tobacco Use   Smoking status: Former    Packs/day: 2.00    Years: 53.00    Total pack years: 106.00    Types: Cigarettes    Quit date: 2014    Years since quitting: 9.8   Smokeless tobacco: Former    Types: Nurse, children's Use: Never used  Substance and Sexual Activity   Alcohol use: No   Drug use: No   Sexual activity: Not Currently  Other Topics Concern   Not on file  Social History Narrative   Not on file   Social Determinants of Health   Financial Resource Strain: Low Risk  (05/05/2021)   Overall Financial Resource Strain (CARDIA)    Difficulty of Paying Living Expenses: Not hard at all  Food Insecurity: No Food Insecurity (05/05/2021)   Hunger Vital Sign    Worried About Running Out of Food in the Last Year: Never true    [place] in the Last Year: Never true  Transportation Needs: No Transportation Needs (05/05/2021)   PRAPARE - Hydrologist (Medical): No    Lack of Transportation (Non-Medical): No  Physical Activity: Sufficiently Active (05/05/2021)   Exercise Vital Sign    Days of Exercise per Week: 7  days    Minutes of Exercise per Session: 40 min  Stress: No Stress Concern Present (05/05/2021)   [place]    Feeling of Stress : Not at all  Social Connections: Socially Isolated (05/05/2021)   Social Connection and Isolation Panel [NHANES]    Frequency of Communication with Friends and Family: More than three times a week    Frequency of Social Gatherings with Friends and Family: More than three times a week    Attends Religious Services: Never    Marine scientist or Organizations: No    Attends Archivist Meetings: Never  Marital Status: Widowed    Tobacco Counseling Counseling given: Not Answered   Clinical Intake:  Pre-visit preparation completed: Yes  Pain : No/denies pain     Nutritional Risks: None Diabetes: Yes CBG done?: Yes CBG resulted in Enter/ Edit results?: No Did pt. bring in CBG monitor from home?: No  How often do you need to have someone help you when you read instructions, pamphlets, or other written materials from your doctor or pharmacy?: 1 - Never What is the last grade level you completed in school?: 9th  Diabetic?yes  Interpreter Needed?: No  Information entered by :: Lacretia Nicks, [place] [facility] [employer]    06/20/2022   11:10 AM 05/11/2022    9:06 AM  In your present state of health, do you have any difficulty performing the following activities:  Hearing? 0 1  Vision? 0 0  Difficulty concentrating or making decisions? 0 0  Walking or climbing stairs? 1 1  Dressing or bathing? 0 0  Doing errands, shopping? 1 0  Preparing Food and eating ? N   Using the Toilet? N   In the past six months, have you accidently leaked urine? N   Do you have problems with loss of bowel control? N   Managing your Medications? N   Managing your Finances? N   Housekeeping or managing your Housekeeping? N     Patient Care Team: [name], MD as PCP -  General (Internal Medicine) Minna Merritts, MD as PCP - Cardiology (Cardiology) Sindy Guadeloupe, MD as Consulting Physician (Hematology and Oncology)  Indicate any recent Medical Services you may have received from other than [facility] providers in the past year (date may be approximate).     Assessment:   This is a routine wellness examination for Toribio.  Hearing/Vision screen No results found.  Dietary issues and exercise activities discussed: Exercise limited by: orthopedic condition(s);respiratory conditions(s)   Goals Addressed   None    Depression Screen    06/20/2022   11:10 AM 06/20/2022   11:09 AM 04/12/2022    8:44 AM 11/09/2021    8:34 AM 05/05/2021   10:17 AM 03/22/2021    8:46 AM  PHQ 2/9 Scores  PHQ - 2 Score 0 0 0 0 0 0    Fall Risk    06/20/2022   11:09 AM 05/11/2022    9:06 AM 04/12/2022    8:44 AM 12/06/2021   10:53 AM 11/09/2021    8:33 AM  Fall Risk   Falls in the past year? 0 0 _0 Number falls in past yr: 0 0 0 1 0  Injury with Fall? 0 0 0 1 0  Comment    bruising   Risk for fall due to : No Fall Risks  Impaired balance/gait;Impaired mobility;History of fall(s) History of fall(s) History of fall(s);Impaired balance/gait  Follow up Falls evaluation completed Falls evaluation completed Falls evaluation completed;Education provided;Falls prevention discussed Falls evaluation completed Falls evaluation completed;Education provided;Falls prevention discussed    FALL RISK PREVENTION PERTAINING TO THE HOME:  Any stairs in or around the home? No  If so, are there any without handrails? No  Home free of loose throw rugs in walkways, pet beds, electrical cords, etc? Yes  Adequate lighting in your home to reduce risk of falls? Yes   ASSISTIVE DEVICES UTILIZED TO PREVENT FALLS:  Life alert? No  Use of a cane, walker or w/c? Yes  Grab bars in the bathroom? No  Shower chair or bench in shower? No  Elevated toilet seat or a handicapped toilet? No   TIMED  UP AND GO:  Was the test performed? No .  Length of time to ambulate-na    Cognitive Function:    06/20/2022   11:09 AM  MMSE - Mini Mental State Exam  Not completed: Unable to complete        06/20/2022   11:09 AM 05/05/2021   10:29 AM  6CIT Screen  What Year? 0 points 0 points  What month? 0 points 0 points  What time? 0 points 0 points  Count back from 20 0 points 0 points  Months in reverse 0 points 0 points  Repeat phrase 0 points 0 points  Total Score 0 points 0 points    Immunizations Immunization History  Administered Date(s) Administered   Fluad Quad(high Dose 65+) 05/11/2020, 05/31/2021, 06/20/2022   Influenza,inj,quad, With Preservative 06/27/2016   PFIZER(Purple Top)SARS-COV-2 Vaccination 11/23/2019, 12/16/2019   Pneumococcal Conjugate-13 04/16/2014   Pneumococcal-Unspecified 05/23/2014   Zoster Recombinat (Shingrix) 01/24/2015    TDAP status: Due, Education has been provided regarding the importance of this vaccine. Advised may receive this vaccine at local pharmacy or Health Dept. Aware to provide a copy of the vaccination record if obtained from local pharmacy or Health Dept. Verbalized acceptance and understanding.  Flu Vaccine status: Completed at today's visit  Pneumococcal vaccine status: Due, Education has been provided regarding the importance of this vaccine. Advised may receive this vaccine at local pharmacy or Health Dept. Aware to provide a copy of the vaccination record if obtained from local pharmacy or Health Dept. Verbalized acceptance and understanding.  Covid-19 vaccine status: Declined, Education has been provided regarding the importance of this vaccine but patient still declined. Advised may receive this vaccine at local pharmacy or Health Dept.or vaccine clinic. Aware to provide a copy of the vaccination record if obtained from local pharmacy or Health Dept. Verbalized acceptance and understanding.  Qualifies for Shingles Vaccine? Yes    Zostavax completed Yes   Shingrix Completed?: No.    Education has been provided regarding the importance of this vaccine. Patient has been advised to call insurance company to determine out of pocket expense if they have not yet received this vaccine. Advised may also receive vaccine at local pharmacy or Health Dept. Verbalized acceptance and understanding.  Screening Tests Health Maintenance  Topic Date Due   Diabetic kidney evaluation - Urine ACR  Never done   Zoster Vaccines- Shingrix (2 of 2) 03/21/2015   HEMOGLOBIN A1C  06/07/2022   Pneumonia Vaccine 31+ Years old (2 - PPSV23 or PCV20) 06/28/2022 (Originally 07/18/2014)   Lung Cancer Screening  06/21/2023 (Originally 04/13/2022)   TETANUS/TDAP  06/21/2023 (Originally 09/19/1964)   COVID-19 Vaccine (3 - Pfizer risk series) 06/21/2023 (Originally 01/13/2020)   Hepatitis C Screening  06/21/2023 (Originally 09/18/1963)   OPHTHALMOLOGY EXAM  12/26/2022   FOOT EXAM  03/28/2023   Diabetic kidney evaluation - GFR measurement  05/12/2023   Medicare Annual Wellness (AWV)  07/21/2023   INFLUENZA VACCINE  Completed   HPV VACCINES  Aged Out    Health Maintenance  Health Maintenance Due  Topic Date Due   Diabetic kidney evaluation - Urine ACR  Never done   Zoster Vaccines- Shingrix (2 of 2) 03/21/2015   HEMOGLOBIN A1C  06/07/2022    Colorectal cancer screening: No longer required.   Lung Cancer Screening: (Low Dose CT Chest recommended if Age 80-80 years, 30 pack-year currently smoking  OR have quit w/in 15years.) does qualify.   Lung Cancer Screening Referral: patient declined   Additional Screening:  Hepatitis C Screening: does qualify; patient declined   Vision Screening: Recommended annual ophthalmology exams for early detection of glaucoma and other disorders of the eye. Is the patient up to date with their annual eye exam?  Yes  Who is the provider or what is the name of the office in which the patient attends annual eye exams?   If pt is not established with a provider, would they like to be referred to a provider to establish care? No .   Dental Screening: Recommended annual dental exams for proper oral hygiene  Community Resource Referral / Chronic Care Management: CRR required this visit?  No   CCM required this visit?  No      Plan:     I have personally reviewed and noted the following in the patient's chart:   Medical and social history Use of alcohol, tobacco or illicit drugs  Current medications and supplements including opioid prescriptions. Patient is not currently taking opioid prescriptions. Functional ability and status Nutritional status Physical activity Advanced directives List of other physicians Hospitalizations, surgeries, and ER visits in previous 12 months Vitals Screenings to include cognitive, depression, and falls Referrals and appointments  In addition, I have reviewed and discussed with patient certain preventive protocols, quality metrics, and best practice recommendations. A written personalized care plan for preventive services as well as general preventive health recommendations were provided to patient.     Lacretia Nicks, [place]   06/20/2022   Nurse Notes:  Mr. Arlington , Thank you for taking time to come for your Medicare Wellness Visit. I appreciate your ongoing commitment to your health goals. Please review the following plan we discussed and let me know if I can assist you in the future.   These are the goals we discussed:  Goals   None     This is a list of the screening recommended for you and due dates:  Health Maintenance  Topic Date Due   Yearly kidney health urinalysis for diabetes  Never done   Zoster (Shingles) Vaccine (2 of 2) 03/21/2015   Hemoglobin A1C  06/07/2022   Pneumonia Vaccine (2 - PPSV23 or PCV20) 06/28/2022*   Screening for Lung Cancer  06/21/2023*   Tetanus Vaccine  06/21/2023*   COVID-19 Vaccine (3 - Pfizer risk series) 06/21/2023*    Hepatitis C Screening: USPSTF Recommendation to screen - Ages 18-79 yo.  06/21/2023*   Eye exam for diabetics  12/26/2022   Complete foot exam   03/28/2023   Yearly kidney function blood test for diabetes  05/12/2023   Medicare Annual Wellness Visit  07/21/2023   Flu Shot  Completed   HPV Vaccine  Aged Out  *Topic was postponed. The date shown is not the original due date.

## 2022-07-10 ENCOUNTER — Telehealth: Payer: Self-pay | Admitting: Cardiovascular Disease

## 2022-07-10 MED ORDER — EMPAGLIFLOZIN 10 MG PO TABS: ORAL_TABLET | ORAL | 2 refills | Status: AC

## 2022-07-10 NOTE — Telephone Encounter (Signed)
Requested Prescriptions   Signed Prescriptions Disp Refills   empagliflozin (JARDIANCE) 10 MG TABS tablet 30 tablet 2    Sig: TAKE 1 TABLET(10 MG) BY MOUTH DAILY    Authorizing Provider: Minna Merritts    Ordering User: NEWCOMER MCCLAIN, Rickayla Wieland L

## 2022-07-10 NOTE — Telephone Encounter (Signed)
*  STAT* If patient is at the pharmacy, call can be transferred to refill team.   1. Which medications need to be refilled? (please list name of each medication and dose if known)   empagliflozin (JARDIANCE) 10 MG TABS tablet    2. Which pharmacy/location (including street and city if local pharmacy) is medication to be sent to? [place] [place] [address] AT [place]   3. Do they need a 30 day or 90 day supply? 30 day

## 2022-07-13 ENCOUNTER — Ambulatory Visit: Payer: Medicare HMO | Admitting: Podiatry

## 2022-07-13 ENCOUNTER — Telehealth: Payer: Self-pay | Admitting: Pulmonary Disease

## 2022-07-13 NOTE — Telephone Encounter (Signed)
Spoke with pt's daughter [name] (per DPR) and offered an OV in [place] but daughter that isn't possible and pt would go to [facility]. Apologized to daughter for not having any [place] OV openings. Daughter stated understanding.

## 2022-07-13 NOTE — Telephone Encounter (Signed)
Primary Pulmonologist: Martie Round Last office visit and with whom: 05/31/2022 Dr Patsey Berthold What do we see them for (pulmonary problems): Stage 3 COPD Last OV assessment/plan:   Was appointment offered to patient (explain)?  Julian Sparks patient   Reason for call: SOB, Wheezing, Cough, Green mucus, patient stated he's feeling like he's coming down with the flu.  (examples of things to ask: : When did symptoms start? Fever? Cough? Productive? Color to sputum? More sputum than usual? Wheezing? Have you needed increased oxygen? Are you taking your respiratory medications? What over the counter measures have you tried?)  Allergies  Allergen Reactions   Aspirin Nausea Only and Other (See Comments)    325 mg aspirin cause stomach upset   Penicillins Rash and Other (See Comments)    Immunization History  Administered Date(s) Administered   Fluad Quad(high Dose 65+) 05/11/2020, 05/31/2021, 06/20/2022   Influenza,inj,quad, With Preservative 06/27/2016   PFIZER(Purple Top)SARS-COV-2 Vaccination 11/23/2019, 12/16/2019   Pneumococcal Conjugate-13 04/16/2014   Pneumococcal-Unspecified 05/23/2014   Zoster Recombinat (Shingrix) 01/24/2015

## 2022-07-24 ENCOUNTER — Other Ambulatory Visit: Payer: Self-pay

## 2022-07-24 DIAGNOSIS — R0989 Other specified symptoms and signs involving the circulatory and respiratory systems: Secondary | ICD-10-CM

## 2022-07-24 MED ORDER — LEVOFLOXACIN 500 MG PO TABS: 500.0000 mg | ORAL_TABLET | Freq: Every day | ORAL | 0 refills | Status: AC

## 2022-08-06 ENCOUNTER — Telehealth: Payer: Self-pay

## 2022-08-06 NOTE — Telephone Encounter (Signed)
Contacted pt and discussed PAP re-enrollment for calendar year 2024. Pt requested renewal form be mailed to them. Address confirmed and placed in outgoing mailbox up front. Will await return of application.

## 2022-08-15 NOTE — Telephone Encounter (Signed)
Julian Sparks, can you remove the asterisks from your documentation on 11/17 so that this encounter can be closed? Thanks!

## 2022-08-30 NOTE — Telephone Encounter (Signed)
Returned filled out form- placed in pharmacy box.

## 2022-08-31 NOTE — Telephone Encounter (Signed)
Faxed PAP renewal form to [employer] today.   Fax# [phone] Phone# [phone]  Knox Saliva, PharmD, MPH, BCPS, CPP Clinical Pharmacist (Rheumatology and Pulmonology)

## 2022-09-03 ENCOUNTER — Other Ambulatory Visit: Payer: Self-pay | Admitting: Pulmonary Disease

## 2022-09-03 ENCOUNTER — Telehealth: Payer: Self-pay | Admitting: Pulmonary Disease

## 2022-09-03 MED ORDER — PREDNISONE 10 MG PO TABS: ORAL_TABLET | ORAL | 0 refills | Status: DC

## 2022-09-03 MED ORDER — DOXYCYCLINE HYCLATE 100 MG PO TABS: 100.0000 mg | ORAL_TABLET | Freq: Two times a day (BID) | ORAL | 0 refills | Status: AC

## 2022-09-03 NOTE — Telephone Encounter (Signed)
Spoke to patient and relayed below message/recommendations. He stated that it would be Wednesday before he could get to ED due to transportation issues.  Beth, please advise. Thanks

## 2022-09-03 NOTE — Telephone Encounter (Signed)
Called and spoke to patient. He reports of prod cough with green sputum, back discomfort, nasal/head congestion, increased SOB, bilateral leg edema x1.5w He stated that he is unable to take a deep breath due to back pain. Pain is in the middle of back. Edema has improved with lasix 20mg .  He wears 2L cont. Spo2 maintaining around 90%. He occ has to increase to 4L with exertion.  Denied f/c/s or additional sx.  Home covid test negative last Sunday.  He is currently taking Breztri BID, albuterol solution 3-4x daily, Ventolin HFA 1-2x daily, Flonase at bedtime, mucinex 600mg  BID and Dupixent every 2 weeks   Beth, please advise. Dr. Patsey Berthold is unavailable.

## 2022-09-03 NOTE — Telephone Encounter (Signed)
Received a fax from  [place] regarding an approval for [place] patient assistance from 08/27/2022 to 08/27/2023. Approval letter sent to scan center.

## 2022-09-03 NOTE — Telephone Encounter (Signed)
Patient is aware of below message/recommendations and voiced his understanding.  Appt scheduled 09/19/22 at 8:30. Nothing further needed.

## 2022-09-03 NOTE — Telephone Encounter (Signed)
I will send in course of doxycycline 100mg  twice daily x 7 days and prednisone taper as directed

## 2022-09-03 NOTE — Telephone Encounter (Signed)
Recommend ED evaluation, concern PNA. If he declined I can send in abx and prednisone and needs follow-up in office first available

## 2022-09-06 ENCOUNTER — Other Ambulatory Visit: Payer: Self-pay | Admitting: Pulmonary Disease

## 2022-09-14 ENCOUNTER — Other Ambulatory Visit: Payer: Self-pay | Admitting: Pharmacist

## 2022-09-14 DIAGNOSIS — J4550 Severe persistent asthma, uncomplicated: Secondary | ICD-10-CM

## 2022-09-14 IMAGING — US US EXTREM LOW VENOUS
1 series · 14 of 24 positions shown · non-contrast
Comparison: 06/25/2019

CLINICAL DATA: Bilateral leg swelling

EXAM:
Bilateral LOWER EXTREMITY VENOUS DOPPLER ULTRASOUND
TECHNIQUE: Gray-scale sonography with compression, as well as color and duplex
ultrasound, were performed to evaluate the deep venous system(s)
from the level of the common femoral vein through the popliteal and
proximal calf veins.

[Series 1: us venous img lower bilat (dvt) · portal-venous · 14 of 59 slices shown]
[im 1/59]
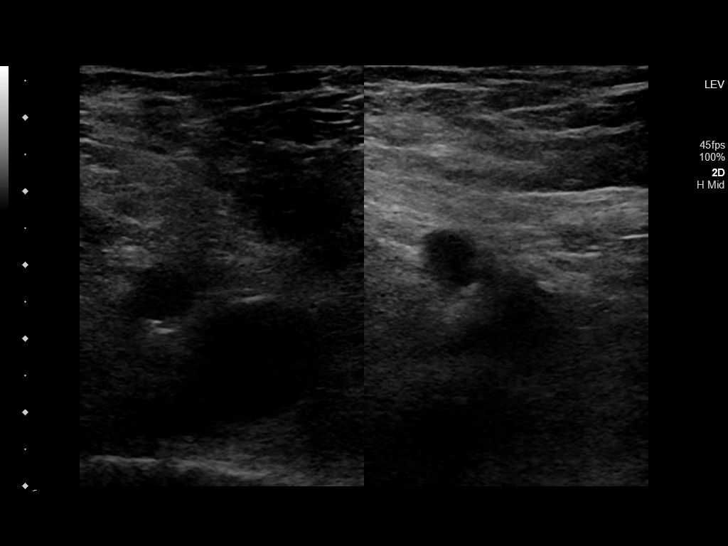
[im 6/59]
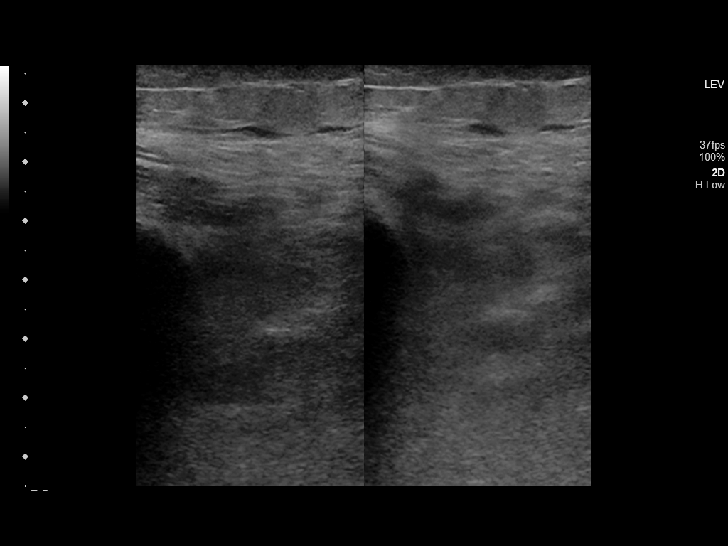
[im 11/59]
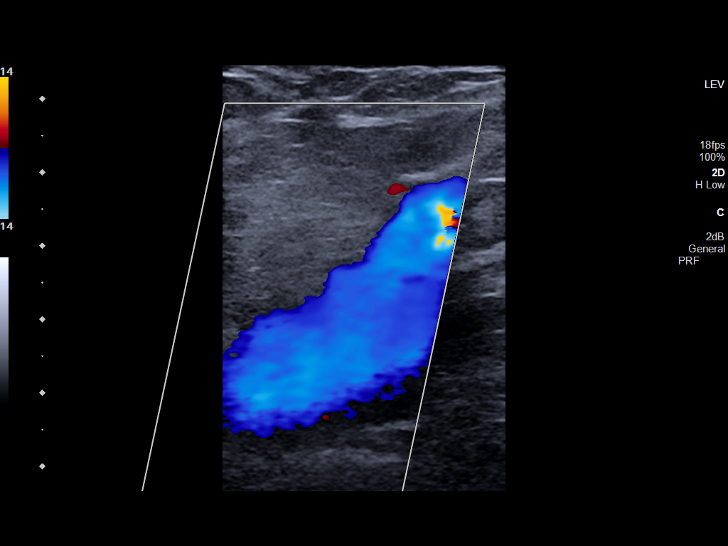
[im 16/59]
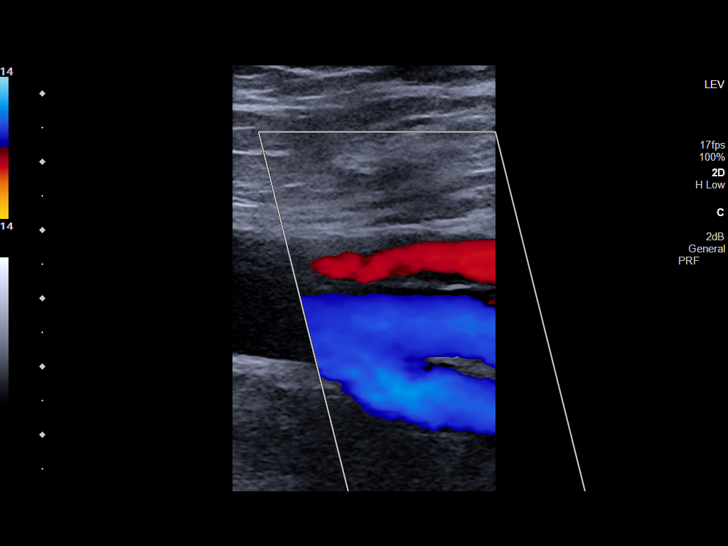
[im 18/59]
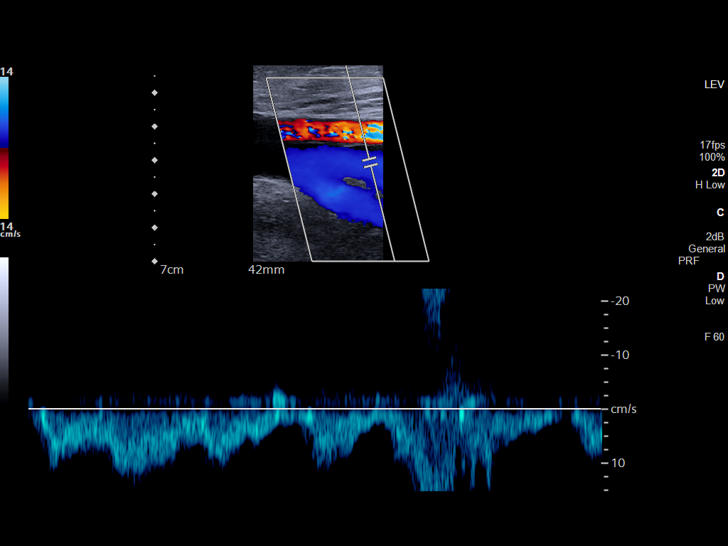
[im 23/59]
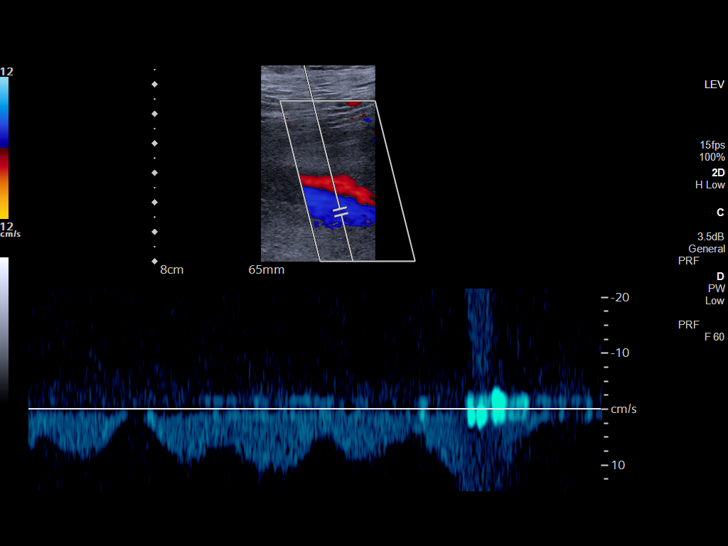
[im 28/59]
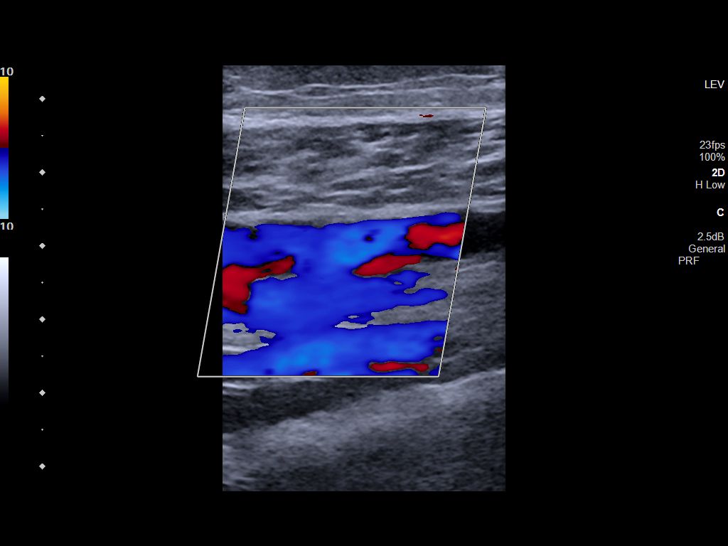
[im 31/59]
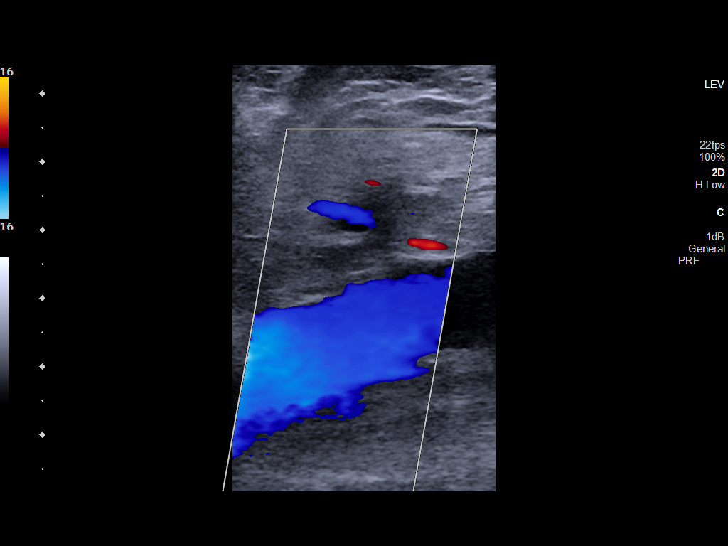
[im 36/59]
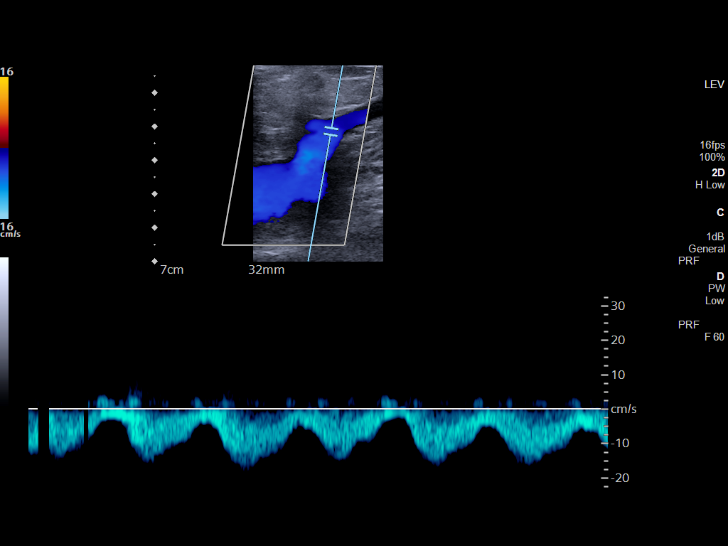
[im 41/59]
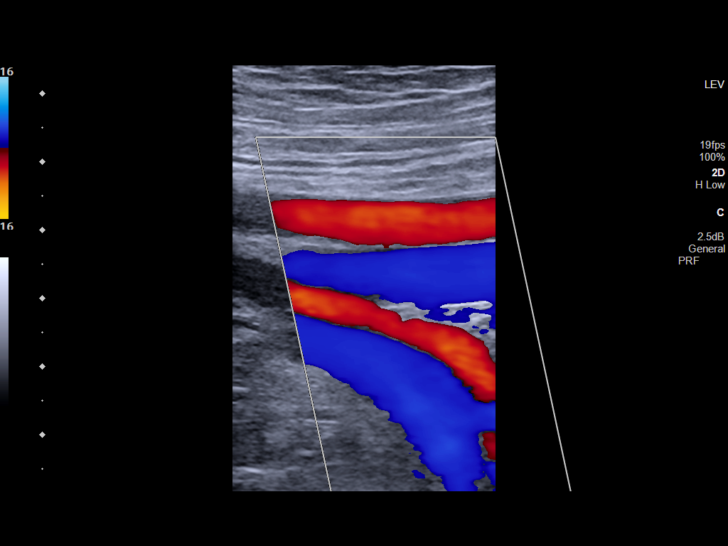
[im 46/59]
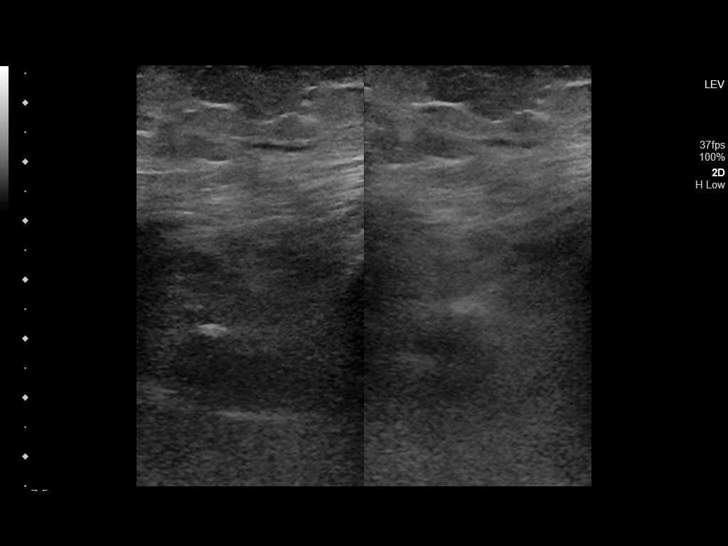
[im 48/59]
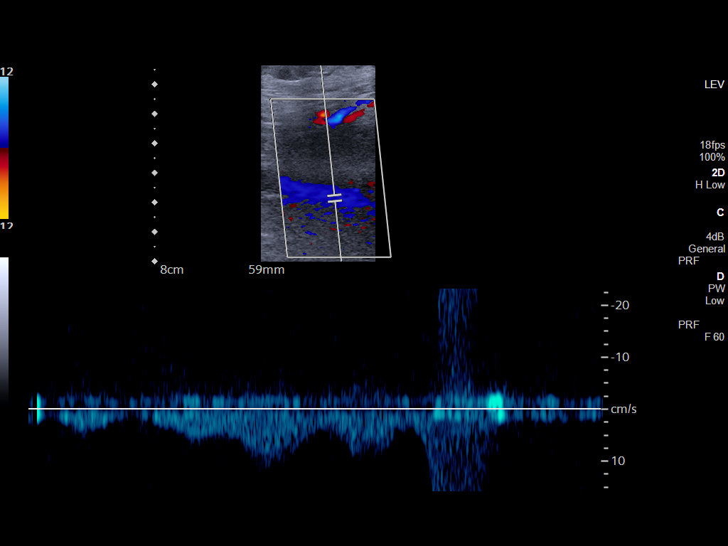
[im 53/59]
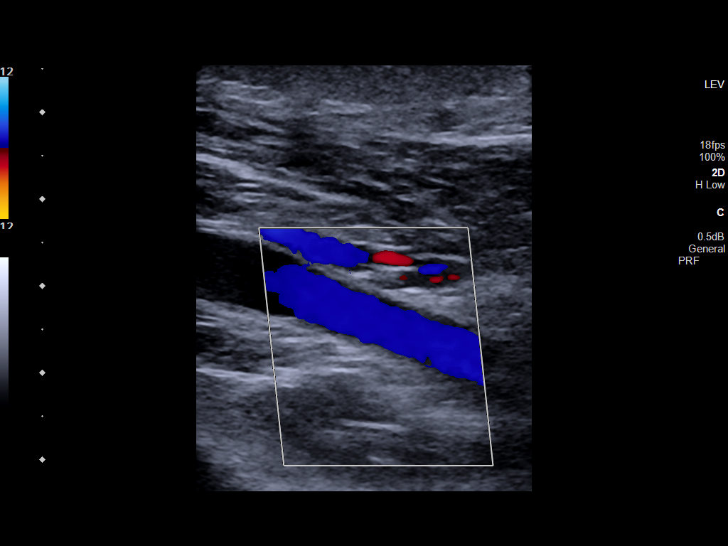
[im 59/59]
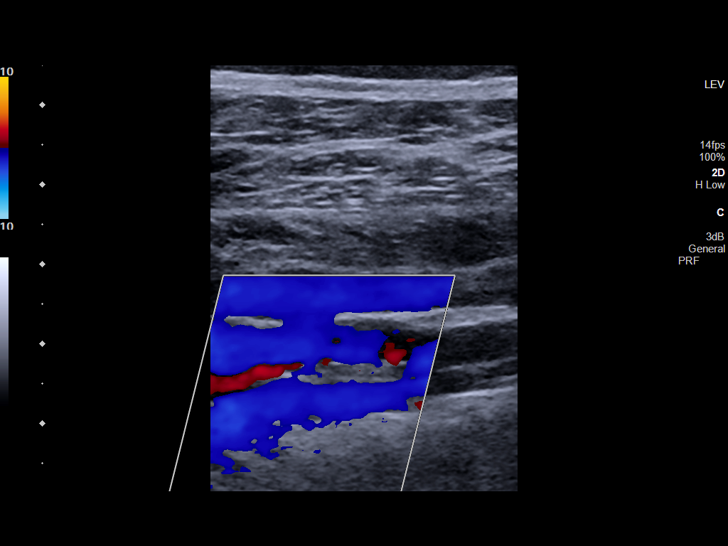

[14 of 24 positions shown; findings below may reference images not displayed]

FINDINGS: VENOUS

Normal compressibility of the common femoral, superficial femoral,
and popliteal veins, as well as the visualized calf veins.
Visualized portions of profunda femoral vein and great saphenous
vein unremarkable. No filling defects to suggest DVT on grayscale or
color Doppler imaging. Doppler waveforms show normal direction of
venous flow, normal respiratory plasticity and response to
augmentation.

OTHER

None.

Limitations: none
IMPRESSION: Negative.

## 2022-09-14 MED ORDER — DUPIXENT 300 MG/2ML ~~LOC~~ SOAJ: 300.0000 mg | SUBCUTANEOUS | 0 refills | Status: AC

## 2022-09-14 NOTE — Telephone Encounter (Signed)
Refill sent for DUPIXENT to [facility]: [phone]  Dose: 300 mg SQ every 2 weeks  Last OV: 05/31/2022 Provider: Dr. Jayme Cloud  Next OV: 10/18/2022  Chesley Mires, PharmD, MPH, BCPS Clinical Pharmacist (Rheumatology and Pulmonology)

## 2022-09-19 ENCOUNTER — Ambulatory Visit: Payer: Medicare HMO | Admitting: Pulmonary Disease

## 2022-09-19 IMAGING — DX DG CHEST 1V PORT
1 series · 1 of 1 positions shown · non-contrast
Comparison: 01/27/2021.

CLINICAL DATA: Respiratory failure.

EXAM:
PORTABLE CHEST 1 VIEW

[chest ap]
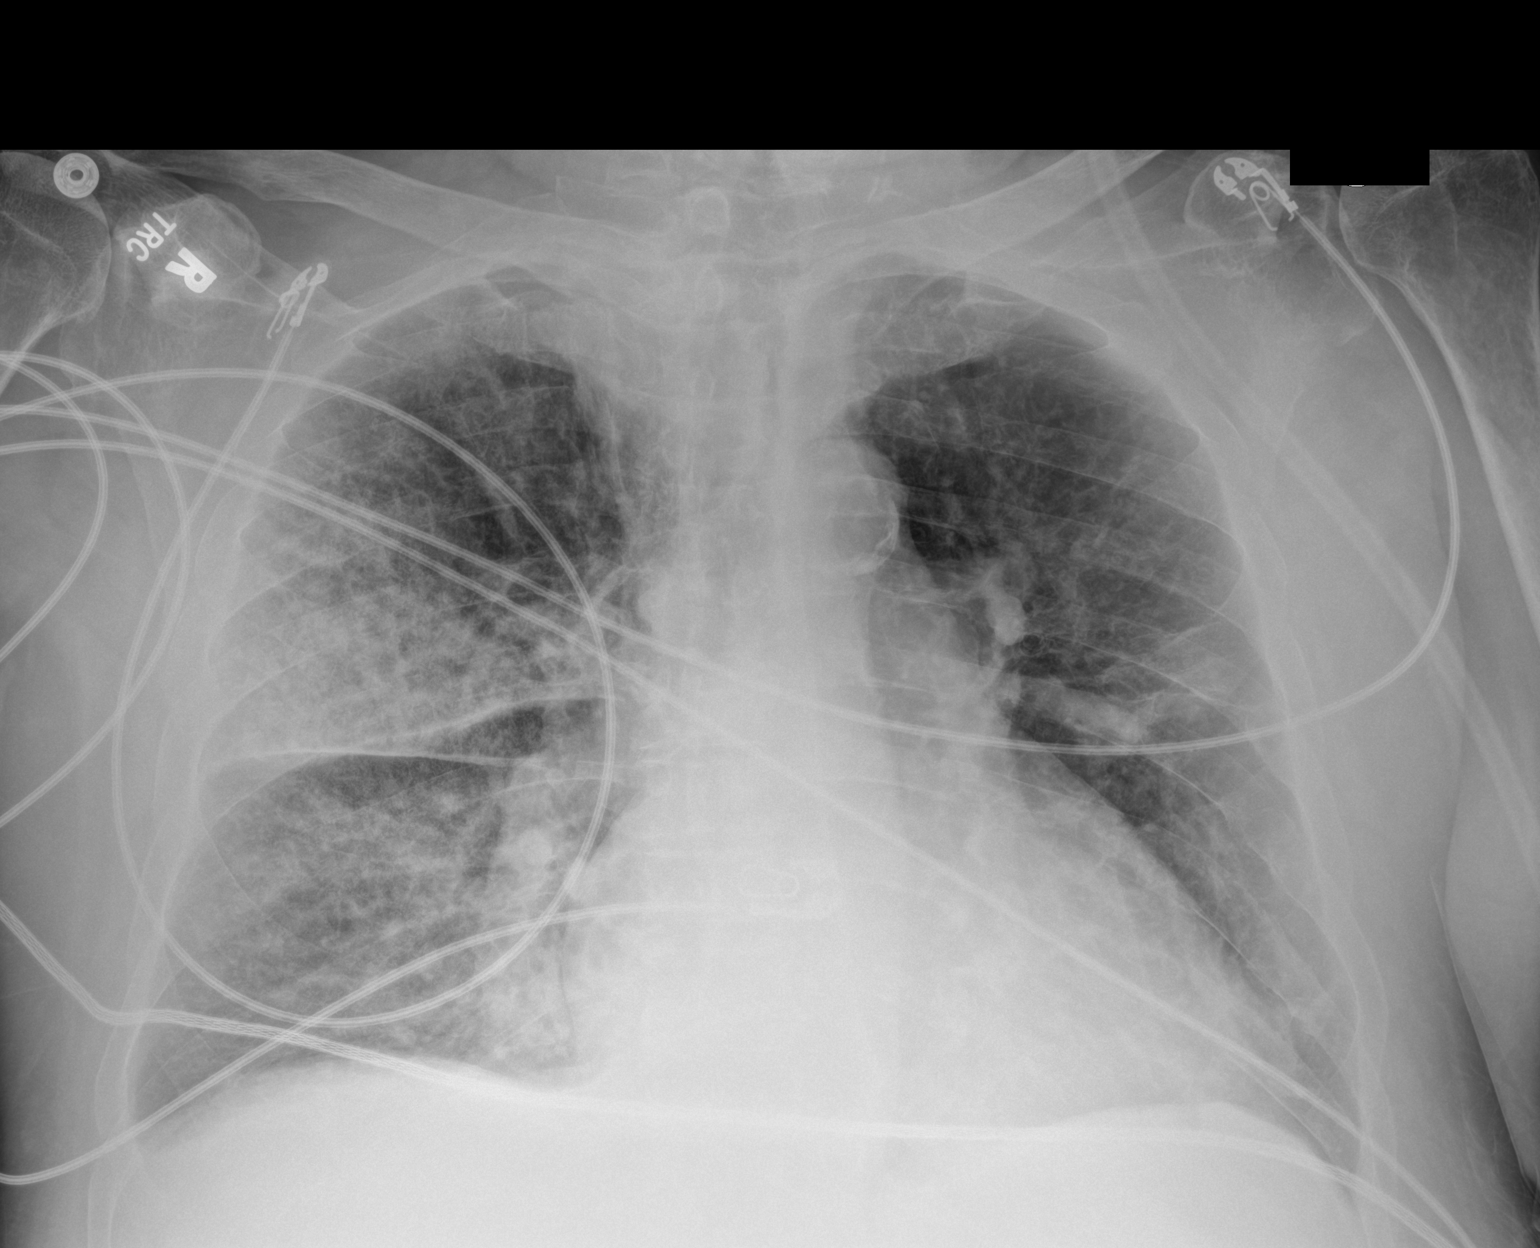

[1 of 1 positions shown; findings below may reference images not displayed]

FINDINGS: Mediastinum and hilar structures normal. Cardiomegaly again noted.
New prominent right upper lobe alveolar infiltrate/edema. Persistent
bilateral interstitial prominence consistent with interstitial edema
and or pneumonitis. Tiny right pleural effusion. No pneumothorax.
IMPRESSION: 1.  Cardiomegaly again noted.

2. New prominent right upper lobe alveolar infiltrates/edema.
Persistent bilateral interstitial prominence consistent interstitial
edema and or pneumonitis. Tiny right pleural effusion.

## 2022-09-22 IMAGING — CT CT ABD-PELV W/ CM
2 of 5 series · 15 of 46 positions shown, 17 images · IV contrast (APPLIED)
Comparison: PET-CT 03/06/2019

CLINICAL DATA: Chronic lymphocytic leukemia/small lymphocytic
lymphoma.

EXAM:
CT ABDOMEN AND PELVIS WITH CONTRAST
TECHNIQUE: Multidetector CT imaging of the abdomen and pelvis was performed
using the standard protocol following bolus administration of
intravenous contrast.
CONTRAST:  100mL OMNIPAQUE IOHEXOL 300 MG/ML  SOLN

[Series 2: routine abd/pel with · axial · 0.89mm/px · z∈[-928,-433]mm · 12 of 111 slices shown, 14 images]
[im 6/111  soft-tissue]
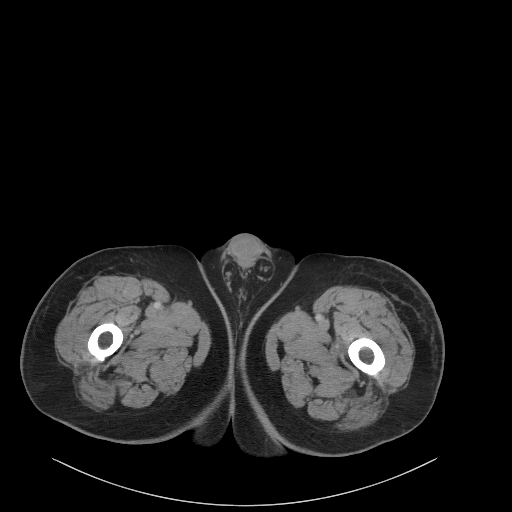
[im 6/111  bone]
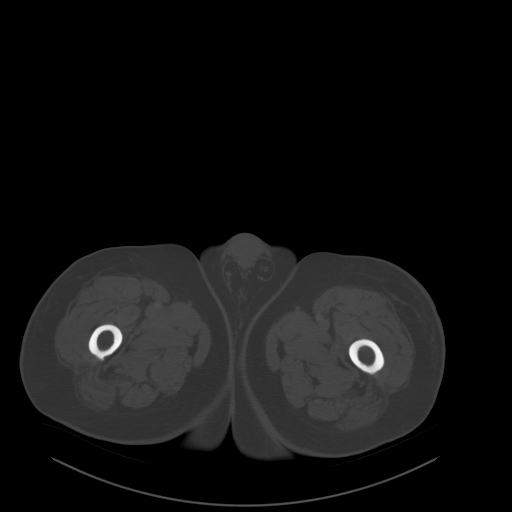
[im 18/111  soft-tissue]
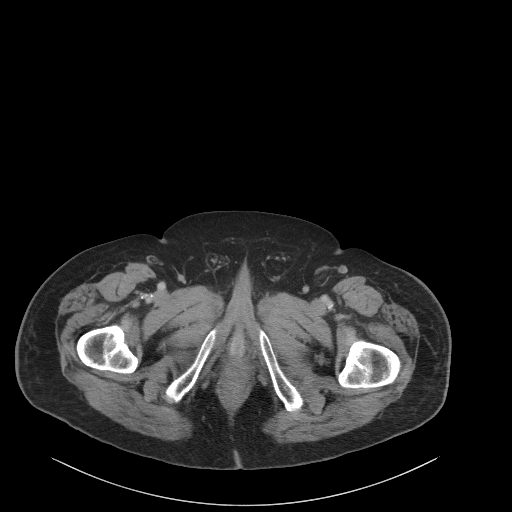
[im 24/111  soft-tissue]
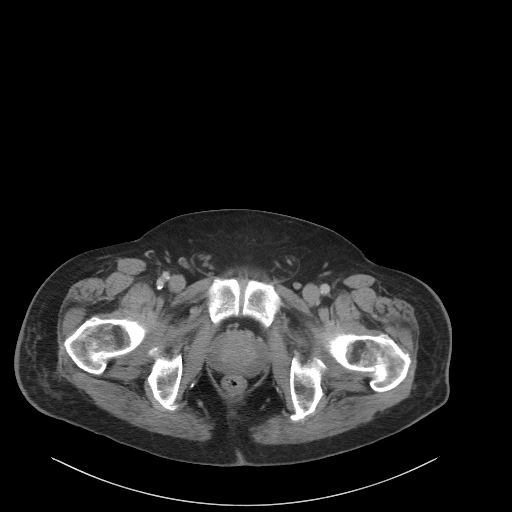
[im 35/111  soft-tissue]
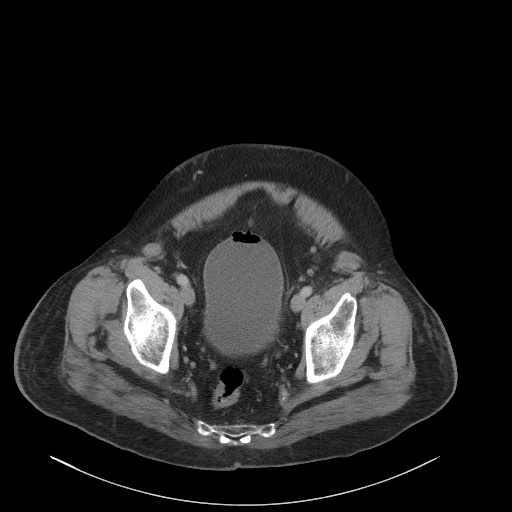
[im 41/111  soft-tissue]
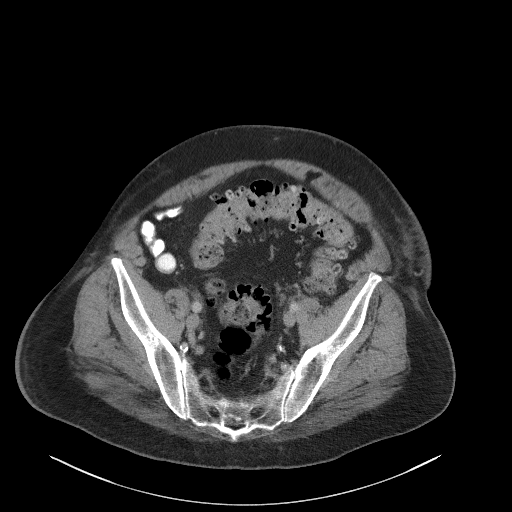
[im 53/111  soft-tissue]
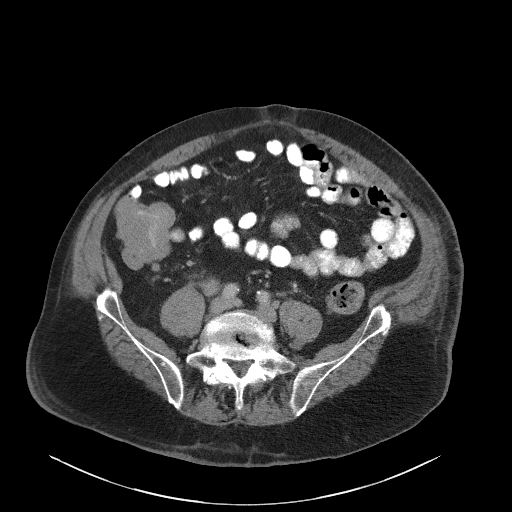
[im 58/111  soft-tissue]
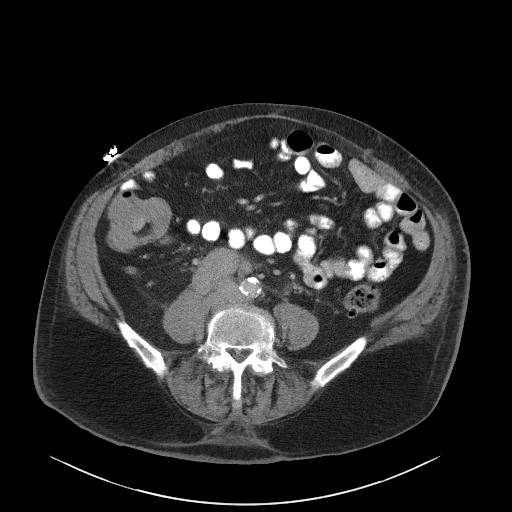
[im 70/111  soft-tissue]
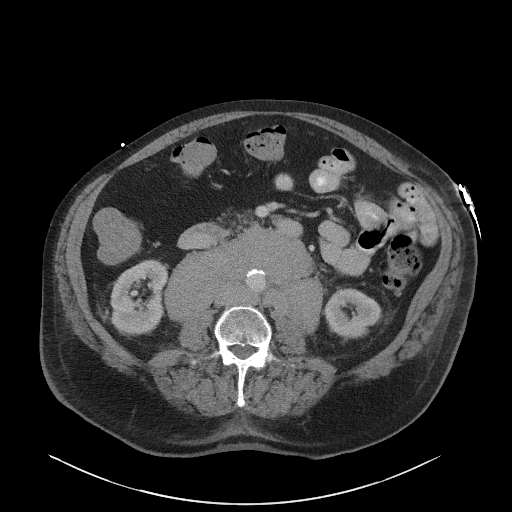
[im 76/111  soft-tissue]
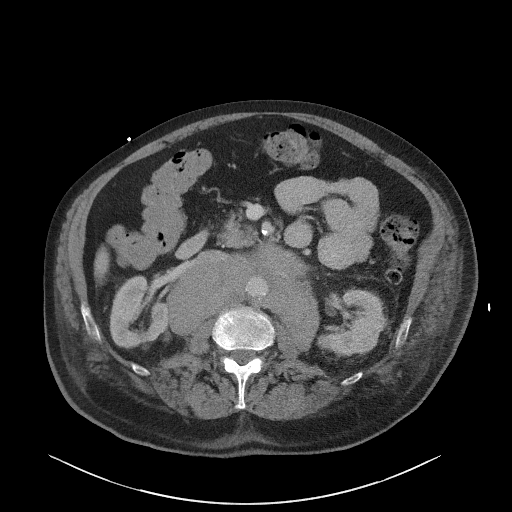
[im 76/111  bone]
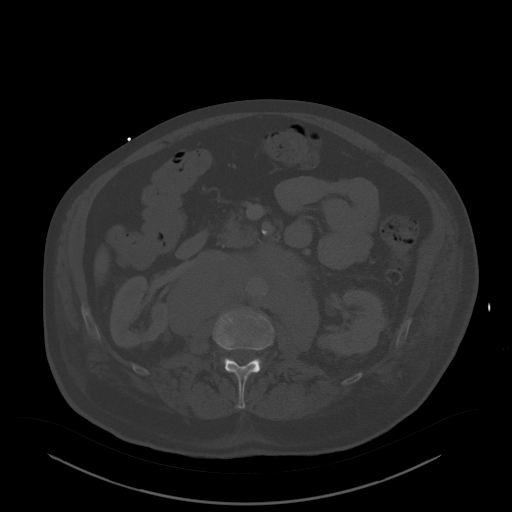
[im 87/111  soft-tissue]
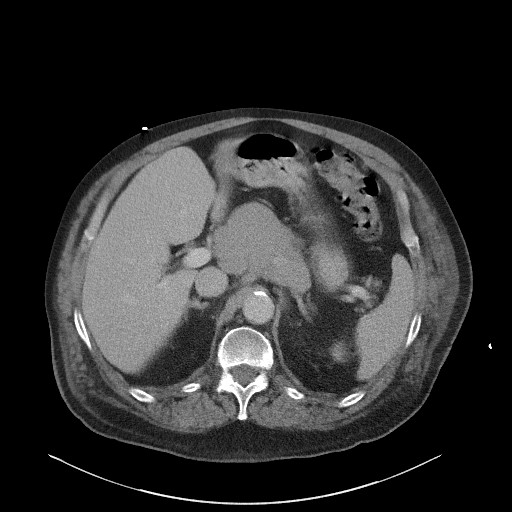
[im 93/111  soft-tissue]
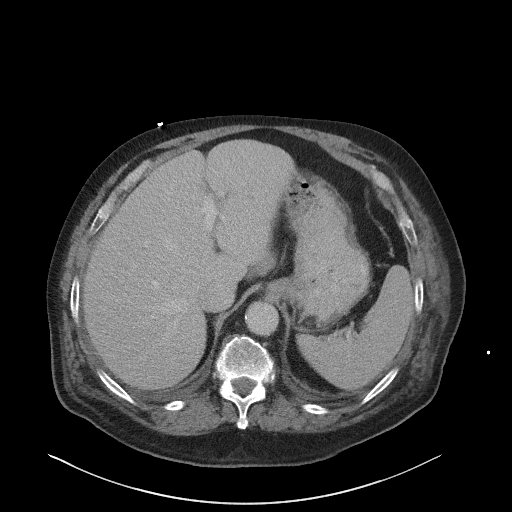
[im 105/111  soft-tissue]
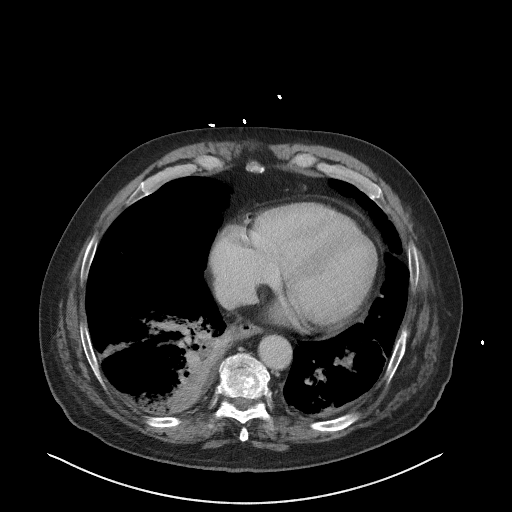

[Series 5: coronal st · coronal · 0.85mm/px · 3 of 102 slices shown]
[im 34/102  soft-tissue]
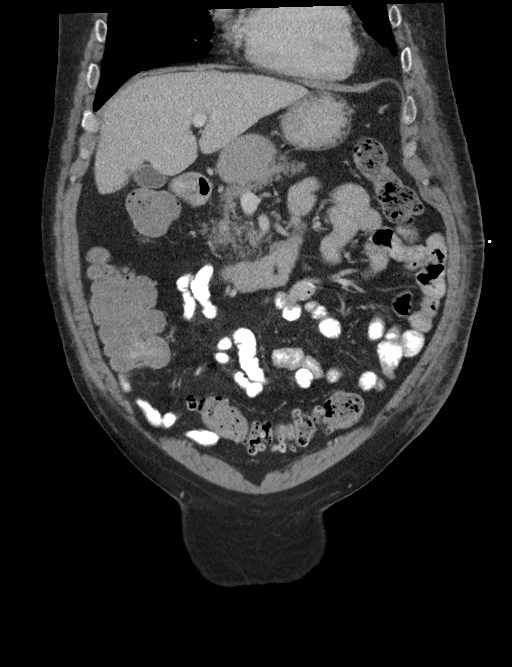
[im 45/102  soft-tissue]
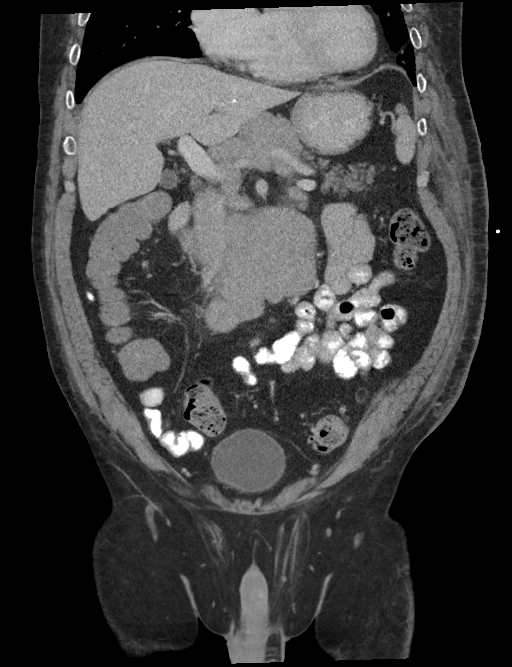
[im 57/102  soft-tissue]
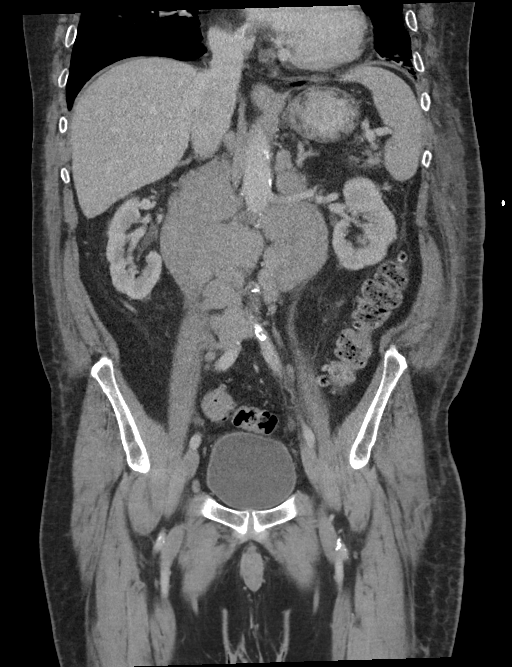

[15 of 46 positions shown; findings below may reference images not displayed]

FINDINGS: Lower chest: Consolidation posteriorly in the right lower lobe with
airspace opacity posteriorly in the right middle lobe and hazy
ground-glass opacities in the lingula. Confluent bandlike volume
loss centrally in the left lower lobe extending to the lung base,
these findings are generally similar to the chest CT from this
morning. Trace bilateral pleural effusions. Mild cardiomegaly. Right
coronary artery and descending thoracic aortic atherosclerosis.

Hepatobiliary: 3 mm hypodense lesion in the left hepatic lobe on
image 14 series 2 is technically too small to characterize although
statistically likely to be benign. Gallbladder unremarkable. No
biliary dilatation.

Pancreas: Unremarkable

Spleen: No splenomegaly or focal splenic lesion.

Adrenals/Urinary Tract: Small amount of gas in the urinary bladder,
query recent catheterization. Otherwise unremarkable.

Stomach/Bowel: Descending and sigmoid colon diverticulosis.

Vascular/Lymphatic: Aortoiliac atherosclerotic vascular disease.

Compared to PET-CT of 03/06/2019 there is substantial increase in
bulky adenopathy along the gastrohepatic ligament/celiac chain and
in the retroperitoneum.

A celiac node measures 5.4 cm in short axis on image 25 series 2,
previously 2.1 cm.

The retroperitoneal adenopathy now forms and encasing rind which
displaces the aorta from the spine. The analogous portion of this
rind to a previously 2.5 cm in short axis right retroperitoneal
lymph node currently measures 4.7 cm. The conglomerate adenopathy in
the periaortic region in total measures about 14.5 by 7.1 by
cm. This adenopathy extends down to the common iliac level but not
into the lower pelvis.

Reproductive: The prostate gland measures 5.5 by 3.8 by 5.3 cm
(volume = 58 cm^3), compatible with mild prostatomegaly.

Other: No supplemental non-categorized findings.

Musculoskeletal: Callus formation compatible with late phase healing
of left seventh and eighth lateral rib fractures. Transitional S1
vertebra. Degenerative disc disease at L5-S1 thought to likely be
causing bilateral foraminal impingement and central narrowing of the
thecal sac. Disc bulge and facet arthropathy at L3-4 and L4-5 likely
cause mild bilateral impingement at L4-5 and mild right foraminal
impingement at L3-4.

Small umbilical hernia contains adipose tissue.
IMPRESSION: 1. Substantial increase in bulky celiac and retroperitoneal
adenopathy compared to the 03/06/2019 PET-CT. Appearance compatible
with limb for proliferative process.
2. Stable basilar airspace opacities, right greater than left, with
stable trace bilateral pleural effusions.
3. Lower lumbar impingement due to spondylosis and degenerative disc
disease.
4. Mild prostatomegaly.
5.  Aortic Atherosclerosis ([id number]).
6. Descending and sigmoid colon diverticulosis.
7. Small umbilical hernia contains adipose tissue.

## 2022-09-29 ENCOUNTER — Emergency Department: Payer: Medicare HMO

## 2022-09-29 ENCOUNTER — Inpatient Hospital Stay: Payer: Medicare HMO

## 2022-09-29 ENCOUNTER — Inpatient Hospital Stay
Admission: EM | Admit: 2022-09-29 | Discharge: 2022-10-26 | DRG: 871 | Disposition: E | Payer: Medicare HMO | Attending: Student | Admitting: Student

## 2022-09-29 DIAGNOSIS — A419 Sepsis, unspecified organism: Principal | ICD-10-CM | POA: Diagnosis present

## 2022-09-29 DIAGNOSIS — J159 Unspecified bacterial pneumonia: Secondary | ICD-10-CM | POA: Diagnosis not present

## 2022-09-29 DIAGNOSIS — Z9981 Dependence on supplemental oxygen: Secondary | ICD-10-CM

## 2022-09-29 DIAGNOSIS — R109 Unspecified abdominal pain: Secondary | ICD-10-CM | POA: Insufficient documentation

## 2022-09-29 DIAGNOSIS — J441 Chronic obstructive pulmonary disease with (acute) exacerbation: Secondary | ICD-10-CM | POA: Diagnosis not present

## 2022-09-29 DIAGNOSIS — J9621 Acute and chronic respiratory failure with hypoxia: Secondary | ICD-10-CM | POA: Diagnosis present

## 2022-09-29 DIAGNOSIS — J9622 Acute and chronic respiratory failure with hypercapnia: Secondary | ICD-10-CM | POA: Diagnosis not present

## 2022-09-29 DIAGNOSIS — Z7984 Long term (current) use of oral hypoglycemic drugs: Secondary | ICD-10-CM

## 2022-09-29 DIAGNOSIS — Z8249 Family history of ischemic heart disease and other diseases of the circulatory system: Secondary | ICD-10-CM

## 2022-09-29 DIAGNOSIS — I509 Heart failure, unspecified: Secondary | ICD-10-CM

## 2022-09-29 DIAGNOSIS — D62 Acute posthemorrhagic anemia: Secondary | ICD-10-CM | POA: Diagnosis not present

## 2022-09-29 DIAGNOSIS — R051 Acute cough: Secondary | ICD-10-CM | POA: Diagnosis not present

## 2022-09-29 DIAGNOSIS — J9601 Acute respiratory failure with hypoxia: Secondary | ICD-10-CM | POA: Diagnosis not present

## 2022-09-29 DIAGNOSIS — I480 Paroxysmal atrial fibrillation: Secondary | ICD-10-CM | POA: Diagnosis present

## 2022-09-29 DIAGNOSIS — Z7951 Long term (current) use of inhaled steroids: Secondary | ICD-10-CM

## 2022-09-29 DIAGNOSIS — J440 Chronic obstructive pulmonary disease with acute lower respiratory infection: Secondary | ICD-10-CM | POA: Diagnosis present

## 2022-09-29 DIAGNOSIS — J439 Emphysema, unspecified: Secondary | ICD-10-CM | POA: Diagnosis present

## 2022-09-29 DIAGNOSIS — Z856 Personal history of leukemia: Secondary | ICD-10-CM

## 2022-09-29 DIAGNOSIS — L03116 Cellulitis of left lower limb: Secondary | ICD-10-CM | POA: Diagnosis present

## 2022-09-29 DIAGNOSIS — E1169 Type 2 diabetes mellitus with other specified complication: Secondary | ICD-10-CM | POA: Diagnosis not present

## 2022-09-29 DIAGNOSIS — T380X5A Adverse effect of glucocorticoids and synthetic analogues, initial encounter: Secondary | ICD-10-CM | POA: Diagnosis not present

## 2022-09-29 DIAGNOSIS — J9611 Chronic respiratory failure with hypoxia: Secondary | ICD-10-CM | POA: Diagnosis present

## 2022-09-29 DIAGNOSIS — I4719 Other supraventricular tachycardia: Secondary | ICD-10-CM | POA: Diagnosis not present

## 2022-09-29 DIAGNOSIS — J45901 Unspecified asthma with (acute) exacerbation: Secondary | ICD-10-CM | POA: Diagnosis present

## 2022-09-29 DIAGNOSIS — K5730 Diverticulosis of large intestine without perforation or abscess without bleeding: Secondary | ICD-10-CM | POA: Diagnosis not present

## 2022-09-29 DIAGNOSIS — R0902 Hypoxemia: Secondary | ICD-10-CM

## 2022-09-29 DIAGNOSIS — Z515 Encounter for palliative care: Secondary | ICD-10-CM

## 2022-09-29 DIAGNOSIS — E875 Hyperkalemia: Secondary | ICD-10-CM | POA: Diagnosis not present

## 2022-09-29 DIAGNOSIS — Z79899 Other long term (current) drug therapy: Secondary | ICD-10-CM

## 2022-09-29 DIAGNOSIS — G4733 Obstructive sleep apnea (adult) (pediatric): Secondary | ICD-10-CM | POA: Diagnosis present

## 2022-09-29 DIAGNOSIS — C3431 Malignant neoplasm of lower lobe, right bronchus or lung: Secondary | ICD-10-CM | POA: Diagnosis present

## 2022-09-29 DIAGNOSIS — I2510 Atherosclerotic heart disease of native coronary artery without angina pectoris: Secondary | ICD-10-CM | POA: Diagnosis not present

## 2022-09-29 DIAGNOSIS — R0689 Other abnormalities of breathing: Secondary | ICD-10-CM

## 2022-09-29 DIAGNOSIS — R6520 Severe sepsis without septic shock: Secondary | ICD-10-CM | POA: Diagnosis present

## 2022-09-29 DIAGNOSIS — G928 Other toxic encephalopathy: Secondary | ICD-10-CM | POA: Diagnosis not present

## 2022-09-29 DIAGNOSIS — J90 Pleural effusion, not elsewhere classified: Secondary | ICD-10-CM | POA: Diagnosis not present

## 2022-09-29 DIAGNOSIS — I5033 Acute on chronic diastolic (congestive) heart failure: Secondary | ICD-10-CM | POA: Diagnosis present

## 2022-09-29 DIAGNOSIS — L03115 Cellulitis of right lower limb: Secondary | ICD-10-CM | POA: Diagnosis present

## 2022-09-29 DIAGNOSIS — R451 Restlessness and agitation: Secondary | ICD-10-CM | POA: Diagnosis not present

## 2022-09-29 DIAGNOSIS — I700 Atherosclerosis of aorta: Secondary | ICD-10-CM | POA: Diagnosis not present

## 2022-09-29 DIAGNOSIS — K922 Gastrointestinal hemorrhage, unspecified: Secondary | ICD-10-CM | POA: Insufficient documentation

## 2022-09-29 DIAGNOSIS — Z6832 Body mass index (BMI) 32.0-32.9, adult: Secondary | ICD-10-CM

## 2022-09-29 DIAGNOSIS — I4510 Unspecified right bundle-branch block: Secondary | ICD-10-CM | POA: Diagnosis present

## 2022-09-29 DIAGNOSIS — K625 Hemorrhage of anus and rectum: Secondary | ICD-10-CM | POA: Diagnosis not present

## 2022-09-29 DIAGNOSIS — Z7189 Other specified counseling: Secondary | ICD-10-CM | POA: Diagnosis not present

## 2022-09-29 DIAGNOSIS — Z87891 Personal history of nicotine dependence: Secondary | ICD-10-CM

## 2022-09-29 DIAGNOSIS — J9690 Respiratory failure, unspecified, unspecified whether with hypoxia or hypercapnia: Secondary | ICD-10-CM | POA: Diagnosis not present

## 2022-09-29 DIAGNOSIS — Z66 Do not resuscitate: Secondary | ICD-10-CM | POA: Insufficient documentation

## 2022-09-29 DIAGNOSIS — R918 Other nonspecific abnormal finding of lung field: Secondary | ICD-10-CM | POA: Insufficient documentation

## 2022-09-29 DIAGNOSIS — Z886 Allergy status to analgesic agent status: Secondary | ICD-10-CM

## 2022-09-29 DIAGNOSIS — Z1152 Encounter for screening for COVID-19: Secondary | ICD-10-CM

## 2022-09-29 DIAGNOSIS — G2581 Restless legs syndrome: Secondary | ICD-10-CM | POA: Diagnosis present

## 2022-09-29 DIAGNOSIS — Z880 Allergy status to penicillin: Secondary | ICD-10-CM

## 2022-09-29 DIAGNOSIS — E669 Obesity, unspecified: Secondary | ICD-10-CM | POA: Diagnosis not present

## 2022-09-29 DIAGNOSIS — R590 Localized enlarged lymph nodes: Secondary | ICD-10-CM | POA: Diagnosis present

## 2022-09-29 DIAGNOSIS — E1165 Type 2 diabetes mellitus with hyperglycemia: Secondary | ICD-10-CM | POA: Diagnosis present

## 2022-09-29 DIAGNOSIS — E139 Other specified diabetes mellitus without complications: Secondary | ICD-10-CM | POA: Diagnosis present

## 2022-09-29 DIAGNOSIS — R911 Solitary pulmonary nodule: Secondary | ICD-10-CM | POA: Diagnosis not present

## 2022-09-29 DIAGNOSIS — Z91199 Patient's noncompliance with other medical treatment and regimen due to unspecified reason: Secondary | ICD-10-CM

## 2022-09-29 DIAGNOSIS — Z833 Family history of diabetes mellitus: Secondary | ICD-10-CM

## 2022-09-29 DIAGNOSIS — R0602 Shortness of breath: Secondary | ICD-10-CM | POA: Diagnosis not present

## 2022-09-29 DIAGNOSIS — M7989 Other specified soft tissue disorders: Secondary | ICD-10-CM | POA: Diagnosis not present

## 2022-09-29 DIAGNOSIS — I5031 Acute diastolic (congestive) heart failure: Secondary | ICD-10-CM | POA: Diagnosis not present

## 2022-09-29 DIAGNOSIS — R1012 Left upper quadrant pain: Secondary | ICD-10-CM | POA: Diagnosis not present

## 2022-09-29 DIAGNOSIS — I2489 Other forms of acute ischemic heart disease: Secondary | ICD-10-CM | POA: Diagnosis present

## 2022-09-29 DIAGNOSIS — E279 Disorder of adrenal gland, unspecified: Secondary | ICD-10-CM | POA: Diagnosis present

## 2022-09-29 DIAGNOSIS — Z7952 Long term (current) use of systemic steroids: Secondary | ICD-10-CM

## 2022-09-29 DIAGNOSIS — E785 Hyperlipidemia, unspecified: Secondary | ICD-10-CM | POA: Diagnosis not present

## 2022-09-29 DIAGNOSIS — I110 Hypertensive heart disease with heart failure: Secondary | ICD-10-CM | POA: Diagnosis not present

## 2022-09-29 DIAGNOSIS — R778 Other specified abnormalities of plasma proteins: Secondary | ICD-10-CM | POA: Diagnosis not present

## 2022-09-29 DIAGNOSIS — R54 Age-related physical debility: Secondary | ICD-10-CM | POA: Diagnosis present

## 2022-09-29 HISTORY — DX: Chronic respiratory failure with hypoxia: J96.11

## 2022-09-29 LAB — RESP PANEL BY RT-PCR (RSV, FLU A&B, COVID)  RVPGX2
Influenza A by PCR: NEGATIVE
Influenza B by PCR: NEGATIVE
Resp Syncytial Virus by PCR: NEGATIVE
SARS Coronavirus 2 by RT PCR: NEGATIVE

## 2022-09-29 LAB — CBC WITH DIFFERENTIAL/PLATELET
Abs Immature Granulocytes: 0.18 K/uL — ABNORMAL HIGH (ref 0.00–0.07)
Basophils Absolute: 0.1 K/uL (ref 0.0–0.1)
Basophils Relative: 0 %
Eosinophils Absolute: 0.3 K/uL (ref 0.0–0.5)
Eosinophils Relative: 2 %
HCT: 37.4 % — ABNORMAL LOW (ref 39.0–52.0)
Hemoglobin: 10.9 g/dL — ABNORMAL LOW (ref 13.0–17.0)
Immature Granulocytes: 1 %
Lymphocytes Relative: 38 %
Lymphs Abs: 5.8 K/uL — ABNORMAL HIGH (ref 0.7–4.0)
MCH: 29.8 pg (ref 26.0–34.0)
MCHC: 29.1 g/dL — ABNORMAL LOW (ref 30.0–36.0)
MCV: 102.2 fL — ABNORMAL HIGH (ref 80.0–100.0)
Monocytes Absolute: 0.5 K/uL (ref 0.1–1.0)
Monocytes Relative: 3 %
Neutro Abs: 8.3 K/uL — ABNORMAL HIGH (ref 1.7–7.7)
Neutrophils Relative %: 56 %
Platelets: 210 K/uL (ref 150–400)
RBC: 3.66 MIL/uL — ABNORMAL LOW (ref 4.22–5.81)
RDW: 15 % (ref 11.5–15.5)
Smear Review: NORMAL
WBC: 15.1 K/uL — ABNORMAL HIGH (ref 4.0–10.5)
nRBC: 0 % (ref 0.0–0.2)

## 2022-09-29 LAB — BLOOD GAS, ARTERIAL
Acid-Base Excess: 12.8 mmol/L — ABNORMAL HIGH (ref 0.0–2.0)
Acid-Base Excess: 14.5 mmol/L — ABNORMAL HIGH (ref 0.0–2.0)
Bicarbonate: 42.8 mmol/L — ABNORMAL HIGH (ref 20.0–28.0)
Bicarbonate: 42.7 mmol/L — ABNORMAL HIGH (ref 20.0–28.0)
Delivery systems: POSITIVE
Delivery systems: POSITIVE
Expiratory PAP: 5 cmH2O
Expiratory PAP: 8 cmH2O
FIO2: 40 %
FIO2: 40 %
Inspiratory PAP: 10 cmH2O
Inspiratory PAP: 13 cmH2O
O2 Saturation: 87.6 %
O2 Saturation: 99.7 %
Patient temperature: 37
Patient temperature: 37
RATE: 8 resp/min
RATE: 10 resp/min
pCO2 arterial: 83 mmHg (ref 32–48)
pCO2 arterial: 69 mmHg (ref 32–48)
pH, Arterial: 7.32 — ABNORMAL LOW (ref 7.35–7.45)
pH, Arterial: 7.4 (ref 7.35–7.45)
pO2, Arterial: 53 mmHg — ABNORMAL LOW (ref 83–108)
pO2, Arterial: 96 mmHg (ref 83–108)

## 2022-09-29 LAB — BRAIN NATRIURETIC PEPTIDE: B Natriuretic Peptide: 260.4 pg/mL — ABNORMAL HIGH (ref 0.0–100.0)

## 2022-09-29 LAB — COMPREHENSIVE METABOLIC PANEL WITH GFR
ALT: 21 U/L (ref 0–44)
AST: 17 U/L (ref 15–41)
Albumin: 3.8 g/dL (ref 3.5–5.0)
Alkaline Phosphatase: 105 U/L (ref 38–126)
Anion gap: 6 (ref 5–15)
BUN: 24 mg/dL — ABNORMAL HIGH (ref 8–23)
CO2: 36 mmol/L — ABNORMAL HIGH (ref 22–32)
Calcium: 8.7 mg/dL — ABNORMAL LOW (ref 8.9–10.3)
Chloride: 98 mmol/L (ref 98–111)
Creatinine, Ser: 0.88 mg/dL (ref 0.61–1.24)
GFR, Estimated: >60 mL/min
Glucose, Bld: 328 mg/dL — ABNORMAL HIGH (ref 70–99)
Potassium: 4.8 mmol/L (ref 3.5–5.1)
Sodium: 140 mmol/L (ref 135–145)
Total Bilirubin: 0.7 mg/dL (ref 0.3–1.2)
Total Protein: 7.2 g/dL (ref 6.5–8.1)

## 2022-09-29 LAB — BLOOD GAS, VENOUS
Acid-Base Excess: 9.3 mmol/L — ABNORMAL HIGH (ref 0.0–2.0)
Bicarbonate: 40.3 mmol/L — ABNORMAL HIGH (ref 20.0–28.0)
O2 Saturation: 89.8 %
Patient temperature: 37
pCO2, Ven: 92 mmHg (ref 44–60)
pH, Ven: 7.25 (ref 7.25–7.43)
pO2, Ven: 60 mmHg — ABNORMAL HIGH (ref 32–45)

## 2022-09-29 LAB — HEMOGLOBIN A1C
Hgb A1c MFr Bld: 10.1 % — ABNORMAL HIGH (ref 4.8–5.6)
Mean Plasma Glucose: 243.17 mg/dL

## 2022-09-29 LAB — LACTIC ACID, PLASMA
Lactic Acid, Venous: 1.2 mmol/L (ref 0.5–1.9)
Lactic Acid, Venous: 1 mmol/L (ref 0.5–1.9)

## 2022-09-29 LAB — TROPONIN I (HIGH SENSITIVITY)
Troponin I (High Sensitivity): 23 ng/L — ABNORMAL HIGH (ref <18)
Troponin I (High Sensitivity): 21 ng/L — ABNORMAL HIGH

## 2022-09-29 LAB — MRSA NEXT GEN BY PCR, NASAL: MRSA by PCR Next Gen: DETECTED — AB

## 2022-09-29 LAB — PROCALCITONIN: Procalcitonin: 0.12 ng/mL

## 2022-09-29 LAB — D-DIMER, QUANTITATIVE: D-Dimer, Quant: 0.93 ug/mL-FEU — ABNORMAL HIGH (ref 0.00–0.50)

## 2022-09-29 LAB — GLUCOSE, CAPILLARY
Glucose-Capillary: 313 mg/dL — ABNORMAL HIGH (ref 70–99)
Glucose-Capillary: 317 mg/dL — ABNORMAL HIGH (ref 70–99)

## 2022-09-29 MED ORDER — MORPHINE SULFATE (PF) 2 MG/ML IV SOLN
INTRAVENOUS | Status: AC
  Administered 2022-09-29: 2 mg via INTRAVENOUS
  Filled 2022-09-29: qty 1

## 2022-09-29 MED ORDER — POLYETHYLENE GLYCOL 3350 17 G PO PACK: 17.0000 g | PACK | Freq: Every day | ORAL | Status: DC | PRN

## 2022-09-29 MED ORDER — TRAZODONE HCL 50 MG PO TABS
25.0000 mg | ORAL_TABLET | Freq: Every evening | ORAL | Status: DC | PRN
  Administered 2022-09-30 – 2022-10-03 (×4): 25 mg via ORAL
  Filled 2022-09-29 (×4): qty 1

## 2022-09-29 MED ORDER — SODIUM CHLORIDE 0.9 % IV SOLN
1.0000 g | Freq: Once | INTRAVENOUS | Status: AC
  Administered 2022-09-29: 1 g via INTRAVENOUS
  Filled 2022-09-29: qty 10

## 2022-09-29 MED ORDER — METHYLPREDNISOLONE SODIUM SUCC 40 MG IJ SOLR
40.0000 mg | Freq: Three times a day (TID) | INTRAMUSCULAR | Status: DC
  Administered 2022-09-29 – 2022-09-30 (×2): 40 mg via INTRAVENOUS
  Filled 2022-09-29 (×2): qty 1

## 2022-09-29 MED ORDER — ASPIRIN 81 MG PO TBEC
81.0000 mg | DELAYED_RELEASE_TABLET | Freq: Every day | ORAL | Status: DC
  Administered 2022-10-01 – 2022-10-02 (×2): 81 mg via ORAL
  Filled 2022-09-29 (×2): qty 1

## 2022-09-29 MED ORDER — ACETAMINOPHEN 325 MG PO TABS
650.0000 mg | ORAL_TABLET | Freq: Four times a day (QID) | ORAL | Status: DC | PRN
  Administered 2022-10-01 – 2022-10-04 (×4): 650 mg via ORAL
  Filled 2022-09-29 (×4): qty 2

## 2022-09-29 MED ORDER — SODIUM CHLORIDE 0.9 % IV SOLN
100.0000 mg | Freq: Two times a day (BID) | INTRAVENOUS | Status: AC
  Administered 2022-09-30 – 2022-10-03 (×8): 100 mg via INTRAVENOUS
  Filled 2022-09-29 (×9): qty 100

## 2022-09-29 MED ORDER — HALOPERIDOL LACTATE 5 MG/ML IJ SOLN: 4.0000 mg | Freq: Once | INTRAMUSCULAR | Status: DC

## 2022-09-29 MED ORDER — DOCUSATE SODIUM 100 MG PO CAPS
100.0000 mg | ORAL_CAPSULE | Freq: Two times a day (BID) | ORAL | Status: DC
  Administered 2022-09-30 – 2022-10-05 (×5): 100 mg via ORAL
  Filled 2022-09-29 (×6): qty 1

## 2022-09-29 MED ORDER — LORAZEPAM 2 MG/ML IJ SOLN
2.0000 mg | Freq: Once | INTRAMUSCULAR | Status: AC
  Administered 2022-09-29: 2 mg via INTRAVENOUS

## 2022-09-29 MED ORDER — IPRATROPIUM-ALBUTEROL 0.5-2.5 (3) MG/3ML IN SOLN
3.0000 mL | Freq: Once | RESPIRATORY_TRACT | Status: AC
  Administered 2022-09-29: 3 mL via RESPIRATORY_TRACT
  Filled 2022-09-29: qty 3

## 2022-09-29 MED ORDER — METHYLPREDNISOLONE SODIUM SUCC 125 MG IJ SOLR
125.0000 mg | Freq: Once | INTRAMUSCULAR | Status: AC
  Administered 2022-09-29: 125 mg via INTRAVENOUS
  Filled 2022-09-29: qty 2

## 2022-09-29 MED ORDER — ENOXAPARIN SODIUM 40 MG/0.4ML IJ SOSY
40.0000 mg | PREFILLED_SYRINGE | INTRAMUSCULAR | Status: DC
  Administered 2022-09-30: 40 mg via SUBCUTANEOUS
  Filled 2022-09-29: qty 0.4

## 2022-09-29 MED ORDER — FUROSEMIDE 10 MG/ML IJ SOLN
40.0000 mg | Freq: Once | INTRAMUSCULAR | Status: AC
  Administered 2022-09-29: 40 mg via INTRAVENOUS
  Filled 2022-09-29: qty 4

## 2022-09-29 MED ORDER — ALBUTEROL SULFATE (2.5 MG/3ML) 0.083% IN NEBU: 2.5000 mg | INHALATION_SOLUTION | RESPIRATORY_TRACT | Status: DC | PRN

## 2022-09-29 MED ORDER — FUROSEMIDE 10 MG/ML IJ SOLN
40.0000 mg | Freq: Every day | INTRAMUSCULAR | Status: DC
  Administered 2022-09-30 – 2022-10-03 (×4): 40 mg via INTRAVENOUS
  Filled 2022-09-29 (×4): qty 4

## 2022-09-29 MED ORDER — SODIUM CHLORIDE 0.9 % IV SOLN
100.0000 mg | Freq: Once | INTRAVENOUS | Status: AC
  Administered 2022-09-29: 100 mg via INTRAVENOUS
  Filled 2022-09-29: qty 100

## 2022-09-29 MED ORDER — ORAL CARE MOUTH RINSE: 15.0000 mL | OROMUCOSAL | Status: DC | PRN

## 2022-09-29 MED ORDER — LORAZEPAM 2 MG/ML IJ SOLN
INTRAMUSCULAR | Status: AC
  Filled 2022-09-29: qty 1

## 2022-09-29 MED ORDER — ONDANSETRON HCL 4 MG PO TABS: 4.0000 mg | ORAL_TABLET | Freq: Four times a day (QID) | ORAL | Status: DC | PRN

## 2022-09-29 MED ORDER — INSULIN ASPART 100 UNIT/ML IJ SOLN: 0.0000 [IU] | Freq: Three times a day (TID) | INTRAMUSCULAR | Status: DC

## 2022-09-29 MED ORDER — ONDANSETRON HCL 4 MG/2ML IJ SOLN: 4.0000 mg | Freq: Four times a day (QID) | INTRAMUSCULAR | Status: DC | PRN

## 2022-09-29 MED ORDER — DEXMEDETOMIDINE HCL IN NACL 400 MCG/100ML IV SOLN
0.0000 ug/kg/h | INTRAVENOUS | Status: DC
  Administered 2022-09-29: 1.4 ug/kg/h via INTRAVENOUS
  Administered 2022-09-29: 1.43 ug/kg/h via INTRAVENOUS
  Administered 2022-09-30: 1 ug/kg/h via INTRAVENOUS
  Administered 2022-09-30: 1.2 ug/kg/h via INTRAVENOUS
  Administered 2022-09-30: 1 ug/kg/h via INTRAVENOUS
  Administered 2022-09-30: 0.5 ug/kg/h via INTRAVENOUS
  Filled 2022-09-29 (×6): qty 100

## 2022-09-29 MED ORDER — ORAL CARE MOUTH RINSE
15.0000 mL | OROMUCOSAL | Status: DC
  Administered 2022-09-29 – 2022-10-05 (×19): 15 mL via OROMUCOSAL

## 2022-09-29 MED ORDER — LOSARTAN POTASSIUM 50 MG PO TABS
25.0000 mg | ORAL_TABLET | Freq: Every day | ORAL | Status: DC
  Administered 2022-10-01 – 2022-10-04 (×3): 25 mg via ORAL
  Filled 2022-09-29 (×3): qty 1

## 2022-09-29 MED ORDER — ALBUTEROL SULFATE (2.5 MG/3ML) 0.083% IN NEBU
2.5000 mg | INHALATION_SOLUTION | RESPIRATORY_TRACT | Status: DC | PRN
  Administered 2022-10-03: 2.5 mg via RESPIRATORY_TRACT
  Filled 2022-09-29: qty 3

## 2022-09-29 MED ORDER — GABAPENTIN 300 MG PO CAPS
300.0000 mg | ORAL_CAPSULE | Freq: Three times a day (TID) | ORAL | Status: DC
  Administered 2022-09-30 – 2022-10-05 (×13): 300 mg via ORAL
  Filled 2022-09-29 (×15): qty 1

## 2022-09-29 MED ORDER — BUDESONIDE 0.25 MG/2ML IN SUSP
0.2500 mg | Freq: Two times a day (BID) | RESPIRATORY_TRACT | Status: DC
  Administered 2022-09-29 – 2022-10-04 (×10): 0.25 mg via RESPIRATORY_TRACT
  Filled 2022-09-29 (×10): qty 2

## 2022-09-29 MED ORDER — ROPINIROLE HCL 1 MG PO TABS
1.0000 mg | ORAL_TABLET | Freq: Every day | ORAL | Status: DC
  Administered 2022-09-30 – 2022-10-04 (×5): 1 mg via ORAL
  Filled 2022-09-29 (×6): qty 1

## 2022-09-29 MED ORDER — SODIUM CHLORIDE 0.9 % IV SOLN
1.0000 g | INTRAVENOUS | Status: AC
  Administered 2022-09-30 – 2022-10-03 (×4): 1 g via INTRAVENOUS
  Filled 2022-09-29: qty 1
  Filled 2022-09-29 (×2): qty 10
  Filled 2022-09-29: qty 1
  Filled 2022-09-29: qty 10

## 2022-09-29 MED ORDER — IPRATROPIUM-ALBUTEROL 0.5-2.5 (3) MG/3ML IN SOLN
3.0000 mL | Freq: Four times a day (QID) | RESPIRATORY_TRACT | Status: DC
  Administered 2022-09-29 – 2022-10-01 (×6): 3 mL via RESPIRATORY_TRACT
  Filled 2022-09-29 (×6): qty 3

## 2022-09-29 MED ORDER — MORPHINE SULFATE (PF) 2 MG/ML IV SOLN
2.0000 mg | Freq: Once | INTRAVENOUS | Status: DC
  Filled 2022-09-29: qty 1

## 2022-09-29 MED ORDER — NITROGLYCERIN 0.4 MG SL SUBL
0.4000 mg | SUBLINGUAL_TABLET | SUBLINGUAL | Status: DC | PRN
  Administered 2022-09-29: 0.4 mg via SUBLINGUAL
  Filled 2022-09-29: qty 1

## 2022-09-29 MED ORDER — ACETAMINOPHEN 650 MG RE SUPP: 650.0000 mg | Freq: Four times a day (QID) | RECTAL | Status: DC | PRN

## 2022-09-29 MED ORDER — METOPROLOL SUCCINATE ER 50 MG PO TB24
25.0000 mg | ORAL_TABLET | Freq: Every day | ORAL | Status: DC
  Administered 2022-10-01 – 2022-10-04 (×3): 25 mg via ORAL
  Filled 2022-09-29 (×4): qty 1

## 2022-09-29 MED ORDER — GLIPIZIDE ER 5 MG PO TB24: 5.0000 mg | ORAL_TABLET | Freq: Two times a day (BID) | ORAL | Status: DC

## 2022-09-29 MED ORDER — ALBUTEROL SULFATE HFA 108 (90 BASE) MCG/ACT IN AERS: 2.0000 | INHALATION_SPRAY | RESPIRATORY_TRACT | Status: DC | PRN

## 2022-09-29 MED ORDER — MORPHINE SULFATE (PF) 2 MG/ML IV SOLN
2.0000 mg | INTRAVENOUS | Status: DC | PRN
  Administered 2022-09-30 – 2022-10-03 (×10): 2 mg via INTRAVENOUS
  Filled 2022-09-29 (×9): qty 1

## 2022-09-29 MED ORDER — CHLORHEXIDINE GLUCONATE CLOTH 2 % EX PADS
6.0000 | MEDICATED_PAD | Freq: Every day | CUTANEOUS | Status: DC
  Administered 2022-09-30 – 2022-10-01 (×2): 6 via TOPICAL

## 2022-09-29 MED ORDER — INSULIN ASPART 100 UNIT/ML IJ SOLN
0.0000 [IU] | Freq: Every day | INTRAMUSCULAR | Status: DC
  Administered 2022-09-29: 4 [IU] via SUBCUTANEOUS
  Filled 2022-09-29: qty 1

## 2022-09-29 MED ORDER — METOLAZONE 5 MG PO TABS: 5.0000 mg | ORAL_TABLET | Freq: Every day | ORAL | Status: DC | PRN

## 2022-09-29 MED ORDER — HALOPERIDOL LACTATE 5 MG/ML IJ SOLN
INTRAMUSCULAR | Status: AC
  Filled 2022-09-29: qty 1

## 2022-09-29 MED ORDER — LORAZEPAM 2 MG/ML IJ SOLN
1.0000 mg | INTRAMUSCULAR | Status: DC | PRN
  Administered 2022-09-29: 1 mg via INTRAVENOUS

## 2022-09-29 MED ORDER — LORAZEPAM 2 MG/ML IJ SOLN
2.0000 mg | INTRAMUSCULAR | Status: DC | PRN
  Administered 2022-10-01 – 2022-10-04 (×3): 2 mg via INTRAVENOUS
  Filled 2022-09-29 (×3): qty 1

## 2022-09-29 MED ORDER — DEXMEDETOMIDINE HCL IN NACL 400 MCG/100ML IV SOLN
INTRAVENOUS | Status: AC
  Filled 2022-09-29: qty 100

## 2022-09-29 MED ORDER — ATORVASTATIN CALCIUM 20 MG PO TABS
40.0000 mg | ORAL_TABLET | Freq: Every day | ORAL | Status: DC
  Administered 2022-10-01 – 2022-10-05 (×5): 40 mg via ORAL
  Filled 2022-09-29 (×5): qty 2

## 2022-09-29 NOTE — Progress Notes (Signed)
Received patient from ED who is on bipap and agitated on arrival.  The patient soon became combative and Dr. Hollice Gong ordered for ativan 2mg  IV, patient continued to escalate and became increasingly combative and agitated, Dr. Hollice Gong ordered for precidex to be administered.  Patient still unresponsive to precidex as he was attempting to unsafely exit the bed, pulling bipap, and IV.  Patient required five people to assist in preventing removal of life saving measures in place. Dr. Hollice Gong then ordered for haldol IV to be administerd, of which patient did not respond to.  IV team was called after multiple IV were pulled out by patient and placed an IV to the left upper arm. Patient continues combative and agitated continuing to require five person assist for safety, during process patient gave himself a skin tear to the right forearm.  Dr. Hollice Gong ordered for a 1:1 sitter as well to be initiated via update on patient's status, foley catheter also ordered at this time. At change of shift patient continues with unchanged condition and NP to bedside, NP ordered for 2mg  morphine IV and Ativan 2mg  IV to be administered once and for precidex dosage to increase to 1.4mg .  Patient combative and nurse unable to safely assess him.  All endorsed to oncoming nurse.

## 2022-09-29 NOTE — ED Triage Notes (Signed)
Pt presents to the ED via POV due to SOB. Pt wears 2L at baseline while resting. Pt's family states he needed 4L today while resting. Pt states he is having a hard time breathing. Pt can barely finish a sentence.

## 2022-09-29 NOTE — ED Notes (Signed)
Pt to room from triage. Pt on 2 liters at 80%. Increased to 6 liters and improved to about 95% on oxygen.

## 2022-09-29 NOTE — ED Provider Notes (Signed)
[facility] Provider Note    Event Date/Time   First MD Initiated Contact with Patient 10/08/2022 1516     (approximate)   History   Shortness of Breath   HPI  Julian Sparks is a 77 y.o. male   Past medical history of COPD on 2 L nasal cannula at baseline, CHF, diabetes who presents to the emergency department with cough, productive sputum, shortness of breath progressively worsening over the last 1 week.  Chills but no fever.  No chest pain.  Has been compliant with his medications including his diuretics, and has been using his albuterol inhaler with some transient relief.  He has noticed swelling in his legs and abdomen as well.  Some weight gain but not sure how much.  Hypoxemia today increased oxygen requirement to 6 L to maintain saturations in the mid 90s.  Independent Historian contributed to assessment above: Daughter at bedside  External Medical Documents Reviewed: Emergency department visit dated 09/21/2021 for heart failure exacerbation which responded well to diuretics IV Lasix      Physical Exam   Triage Vital Signs: ED Triage Vitals  Enc Vitals Group     BP 10/11/2022 1507 (!) 154/73     Pulse Rate 09/30/2022 1507 (!) 101     Resp 10/14/2022 1507 (!) 26     Temp --      Temp src --      SpO2 09/27/2022 1507 92 %     Weight 10/16/2022 1511 205 lb 4 oz (93.1 kg)     Height 10/20/2022 1511 5\' 6"  (1.676 m)     Head Circumference --      Peak Flow --      Pain Score 10/21/2022 1506 0     Pain Loc --      Pain Edu? --      Excl. in [place]? --     Most recent vital signs: Vitals:   10/17/2022 1600 10/06/2022 1641  BP: 139/73   Pulse: 98   Resp: 17   Temp:  98 F (36.7 C)  SpO2: 97%     General: Awake, alert oriented answering questions in short sentences, tachypnea and respiratory distress. CV:  Bilateral lower extremity pitting edema and edematous upper extremities as well. Resp:  Tachypnea, rales, respiratory  distress Abd:  Distended. Other:  There is some cellulitic changes to the left lower extremity.   ED Results / Procedures / Treatments   Labs (all labs ordered are listed, but only abnormal results are displayed) Labs Reviewed  COMPREHENSIVE METABOLIC PANEL - Abnormal; Notable for the following components:      Result Value   CO2 36 (*)    Glucose, Bld 328 (*)    BUN 24 (*)    Calcium 8.7 (*)    All other components within normal limits  CBC WITH DIFFERENTIAL/PLATELET - Abnormal; Notable for the following components:   WBC 15.1 (*)    RBC 3.66 (*)    Hemoglobin 10.9 (*)    HCT 37.4 (*)    MCV 102.2 (*)    MCHC 29.1 (*)    Neutro Abs 8.3 (*)    Lymphs Abs 5.8 (*)    Abs Immature Granulocytes 0.18 (*)    All other components within normal limits  BRAIN NATRIURETIC PEPTIDE - Abnormal; Notable for the following components:   B Natriuretic Peptide 260.4 (*)    All other components within normal limits  BLOOD GAS, VENOUS - Abnormal; Notable  for the following components:   pCO2, Ven 92 (*)    pO2, Ven 60 (*)    Bicarbonate 40.3 (*)    Acid-Base Excess 9.3 (*)    All other components within normal limits  TROPONIN I (HIGH SENSITIVITY) - Abnormal; Notable for the following components:   Troponin I (High Sensitivity) 23 (*)    All other components within normal limits  RESP PANEL BY RT-PCR (RSV, FLU A&B, COVID)  [id number]  TROPONIN I (HIGH SENSITIVITY)     I ordered and reviewed the above labs they are notable for white blood cell count is elevated 15.1.  Hemoglobin 10.9 which is decreased from fourteen 1 year ago but has been low as 9.4 in 2022, macrocytic  EKG  ED ECG REPORT I, Lucillie Garfinkel, the attending physician, personally viewed and interpreted this ECG.   Date: 09/27/2022  EKG Time: 1506  Rate: 101  Rhythm: sinus tachycardia  Axis: LAD  Intervals: incomplete rbbb  ST&T Change: no acute ischemic changes    RADIOLOGY I independently reviewed and interpreted the  use opacities in the right greater than left lung fields concerning for pneumonia or pulmonary edema   PROCEDURES:  Critical Care performed: Yes, see critical care procedure note(s)  .Critical Care  Performed by: Lucillie Garfinkel, MD Authorized by: Lucillie Garfinkel, MD   Critical care provider statement:    Critical care time (minutes):  30   Critical care was time spent personally by me on the following activities:  Development of treatment plan with patient or surrogate, discussions with consultants, evaluation of patient's response to treatment, examination of patient, ordering and review of laboratory studies, ordering and review of radiographic studies, ordering and performing treatments and interventions, pulse oximetry, re-evaluation of patient's condition and review of old [place] ED: Medications  albuterol (PROVENTIL) (2.5 MG/3ML) 0.083% nebulizer solution 2.5 mg (has no administration in time range)  nitroGLYCERIN (NITROSTAT) SL tablet 0.4 mg (0.4 mg Sublingual Given 10/22/2022 1554)  doxycycline (VIBRAMYCIN) 100 mg in sodium chloride 0.9 % 250 mL IVPB (100 mg Intravenous New Bag/Given 10/24/2022 1616)  cefTRIAXone (ROCEPHIN) 1 g in sodium chloride 0.9 % 100 mL IVPB (has no administration in time range)  doxycycline (VIBRAMYCIN) 100 mg in sodium chloride 0.9 % 250 mL IVPB (has no administration in time range)  methylPREDNISolone sodium succinate (SOLU-MEDROL) 40 mg/mL injection 40 mg (has no administration in time range)  ipratropium-albuterol (DUONEB) 0.5-2.5 (3) MG/3ML nebulizer solution 3 mL (has no administration in time range)  albuterol (PROVENTIL) (2.5 MG/3ML) 0.083% nebulizer solution 2.5 mg (has no administration in time range)  furosemide (LASIX) injection 40 mg (has no administration in time range)  furosemide (LASIX) injection 40 mg (40 mg Intravenous Given 10/19/2022 1542)  ipratropium-albuterol (DUONEB) 0.5-2.5 (3) MG/3ML nebulizer solution 3 mL (3 mLs  Nebulization Given 10/09/2022 1548)  methylPREDNISolone sodium succinate (SOLU-MEDROL) 125 mg/2 mL injection 125 mg (125 mg Intravenous Given 10/16/2022 1544)  cefTRIAXone (ROCEPHIN) 1 g in sodium chloride 0.9 % 100 mL IVPB (0 g Intravenous Stopped 09/28/2022 1613)    External physician / consultants:  I spoke with hospitalist for admission and regarding care plan for this patient.   IMPRESSION / MDM / ASSESSMENT AND PLAN / ED COURSE  I reviewed the triage vital signs and the nursing notes.  Patient's presentation is most consistent with acute presentation with potential threat to life or bodily function.  Differential diagnosis includes, but is not limited to, CHF exacerbation, COPD exacerbation, respiratory infection, bacterial pneumonia, sepsis, cellulitis, ACS, PE   The patient is on the cardiac monitor to evaluate for evidence of arrhythmia and/or significant heart rate changes.  MDM: Patient with likely mixed CHF and COPD exacerbation will treat both with DuoNebs, steroids, Lasix, nitroglycerin, and BiPAP given respiratory distress.  Workup including labs, troponins, chest x-ray, viral swabs.  Requiring increase in O2 requirement up to 6 L nasal cannula to maintain saturations in the 90s, will need to admit.  I gave him CAP coverage given severe COPD exacerbation likely with ceftriaxone and doxycycline which will also cover for his cellulitic changes in the L  lower extremity  Subjective improvement on BiPAP.  pH is 7.25 and hypercapnia with a pCO2 over 90.  Will recheck after a period of time on BiPAP.  He is putting out urine to Lasix.  Admission        FINAL CLINICAL IMPRESSION(S) / ED DIAGNOSES   Final diagnoses:  COPD exacerbation ([place])  Acute on chronic congestive heart failure, unspecified heart failure type ([place])  Acute cough  Hypercapnia  Hypoxia     Rx / DC Orders   ED Discharge Orders     None        Note:  This document was  prepared using Dragon voice recognition software and may include unintentional dictation errors.    Lucillie Garfinkel, MD 10/14/2022 [phone]

## 2022-09-29 NOTE — Progress Notes (Signed)
Verbal order received from Dr. Hollice Gong for ativan 1mg  every three hours prn IV.

## 2022-09-29 NOTE — H&P (Signed)
History and Physical  Julian Sparks VQQ:[id number] DOB: 1946/06/28 DOA: 09/27/2022  PCP: [name], MD   Patient coming from: Home   Chief Complaint: Shortness of breath   HPI: Julian Sparks is a 77 y.o. male with medical history significant for diastolic congestive heart failure, COPD chronically on 2 to 4 L nasal cannula oxygen, non-insulin-dependent type 2 diabetes, insomnia, obstructive sleep apnea, restless leg syndrome being admitted to the hospital with acute hypoxic and hypercapnic respiratory failure likely due to combination of exacerbation of COPD as well as acute on chronic diastolic congestive heart failure.  History is provided by the patient's daughter, who lives next door to him and takes close care of him.  She tells me that about 5 or 6 days ago, family noticed that his bilateral lower extremities were swelling, and he started taking an extra dose of Lasix which his cardiologist Dr. Rockey Situ had prescribed.  This seemed to help the swelling.  A couple of days ago, they noticed that he sounded a little congested, but was otherwise doing well.  He is on his baseline oxygen which is 2 L at rest, 4 L with ambulation, and 6 L at night with his trilogy machine.  This morning, he seemed winded with dyspnea even at rest, so he was brought to the emergency department for evaluation in the afternoon and he continued to get more more short of breath.  Daughter denies any fevers, cough, complaints of chest pain, nausea, vomiting.  His only complaint was dyspnea.  ED Course: In the emergency department, he was requiring 6 L nasal cannula oxygen to maintain O2 saturation of 92%.  VBG on the emergency department shows 7.2 5/92/60.  Patient was placed on BiPAP and showed marked improvement in his respiratory distress.  Further lab work reveals relatively unremarkable CMP, except for blood glucose 328.  BNP elevated at 260, WBC 15,000, hemoglobin 10.9, platelets 210.  Blood pressure was also elevated at  153/90, he was given a dose of Lasix, IV Solu-Medrol, DuoNebs, and hospitalist was contacted for stepdown unit admission.  Currently: She was seen and examined on stepdown unit, he is resting comfortably on BiPAP.  He does seem a bit anxious, and restless.  He denies any pain, denies shortness of breath, he is very thirsty and asking to drink something.  Review of Systems: Please see HPI for pertinent positives and negatives. A complete 10 system review of systems are otherwise negative.  Past Medical History:  Diagnosis Date   CHF (congestive heart failure) (HCC)    Chronic bronchitis (HCC)    COPD (chronic obstructive pulmonary disease) (HCC)    Diabetes mellitus without complication (HCC)    Diastolic dysfunction    Dyspnea    with exertion   Hypertension    Past Surgical History:  Procedure Laterality Date   CIRCUMCISION     SHOULDER ARTHROSCOPY WITH OPEN ROTATOR CUFF REPAIR Left 06/20/2016   Procedure: SHOULDER ARTHROSCOPY WITH OPEN ROTATOR CUFF REPAIR;  Surgeon: Earnestine Leys, MD;  Location: [facility] ORS;  Service: Orthopedics;  Laterality: Left;   UPPER GI ENDOSCOPY      Social History:  reports that he quit smoking about 10 years ago. His smoking use included cigarettes. He has a 106.00 pack-year smoking history. He has quit using smokeless tobacco.  His smokeless tobacco use included chew. He reports that he does not drink alcohol and does not use drugs.   Allergies  Allergen Reactions   Aspirin Nausea Only and Other (See  Comments)    325 mg aspirin cause stomach upset   Penicillins Rash and Other (See Comments)    Family History  Problem Relation Age of Onset   Hypertension Mother    Cancer Sister    Diabetes Brother      Prior to Admission medications   Medication Sig Start Date End Date Taking? Authorizing Provider  ACCU-CHEK AVIVA PLUS test strip TEST BLOOD SUGAR TWICE DAILY 12/25/21   [name], MD  Accu-Chek Softclix Lancets lancets CHECK BLOOD SUGAR TWO  TIMES DAILY 01/01/22   [name], MD  albuterol (PROVENTIL) (2.5 MG/3ML) 0.083% nebulizer solution INHALE THE CONTENTS OF 1 VIAL VIA NEBULIZER EVERY 6 HOURS AS NEEDED FOR SHORTNESS OF BREATH OR WHEEZING 09/03/22   Tyler Pita, MD  albuterol (VENTOLIN HFA) 108 (90 Base) MCG/ACT inhaler Inhale 2 puffs into the lungs every 6 (six) hours as needed for wheezing or shortness of breath. 12/06/21   [name], MD  Alcohol Swabs (DROPSAFE ALCOHOL PREP) 70 % PADS USE AS DIRECTED 03/09/22   [name], MD  aspirin 81 MG EC tablet Take 81 mg by mouth daily. Swallow whole.     [provider]  atorvastatin (LIPITOR) 40 MG tablet TAKE 1 TABLET EVERY DAY 03/13/22   [name], MD  B Complex-C (SUPER B COMPLEX PO) Take 1 tablet by mouth daily.     [provider]  Blood Glucose Monitoring Suppl (ACCU-CHEK AVIVA PLUS) w/Device KIT Check blood sugar BID 11/01/20   [name], MD  Budeson-Glycopyrrol-Formoterol (BREZTRI AEROSPHERE) 160-9-4.8 MCG/ACT AERO Inhale 2 puffs into the lungs in the morning and at bedtime. 12/05/21   Tyler Pita, MD  chlorpheniramine-HYDROcodone [facility] PENNKINETIC ER) 10-8 MG/5ML Take 5 mLs by mouth 2 (two) times daily. 03/07/22   [name], MD  doxycycline (VIBRA-TABS) 100 MG tablet Take 1 tablet (100 mg total) by mouth 2 (two) times daily. 09/03/22   Martyn Ehrich, NP  Dupilumab (DUPIXENT) 300 MG/2ML SOPN Inject 300 mg into the skin every 14 (fourteen) days. 09/14/22   Tyler Pita, MD  empagliflozin (JARDIANCE) 10 MG TABS tablet TAKE 1 TABLET(10 MG) BY MOUTH DAILY 07/10/22   Minna Merritts, MD  furosemide (LASIX) 20 MG tablet TAKE 2 TABLETS EVERY DAY 12/25/21   [name], MD  gabapentin (NEURONTIN) 300 MG capsule TAKE 1 CAPSULE THREE TIMES DAILY 06/18/22   [name], MD  gentamicin cream (GARAMYCIN) 0.1 % Apply 1 Application topically 2 (two) times daily. 04/10/22   Edrick Kins, DPM  glipiZIDE (GLUCOTROL XL) 5 MG 24 hr tablet  TAKE 1 TABLET TWICE DAILY 04/09/22   [name], MD  losartan (COZAAR) 25 MG tablet Take 1 tablet (25 mg total) by mouth daily. 04/12/22   Alisa Graff, FNP  metFORMIN (GLUCOPHAGE) 500 MG tablet TAKE 1 TABLET TWICE DAILY 03/13/22   [name], MD  metolazone (ZAROXOLYN) 5 MG tablet For weight 201 or higher take metolazone 5 mg, then 30 minutes later take lasix 40 mg 06/07/21   Minna Merritts, MD  metoprolol succinate (TOPROL-XL) 25 MG 24 hr tablet TAKE 1 TABLET(25 MG) BY MOUTH DAILY 11/13/21   [name], MD  predniSONE (DELTASONE) 10 MG tablet 4 tabs daily for 3 days, then 3 tabs daily for 3 days, 2 tabs daily for 3 days, then 1 tab daily for 3 days, then stop 09/03/22   Martyn Ehrich, NP  predniSONE (DELTASONE) 5 MG tablet TAKE 2 TABLETS EVERY DAY WITH BREAKFAST  Patient taking differently: Take 5 mg by mouth daily with breakfast. Weaning. Now taking 1 tablet daily 01/04/22   [name], MD  Pseudoeph-Doxylamine-DM-APAP (NYQUIL PO) Take by mouth as needed.    [provider]  rOPINIRole (REQUIP) 1 MG tablet TAKE 1 TABLET(1 MG) BY MOUTH AT BEDTIME 09/06/22   Tyler Pita, MD  traZODone (DESYREL) 50 MG tablet TAKE 2 TABLETS EVERY DAY 06/07/22   [name], MD    Physical Exam: BP (!) 153/90   Pulse 87   Temp (!) 97.5 F (36.4 C) (Axillary)   Resp (!) 27   Ht 5\' 6"  (1.676 m)   Wt 92.4 kg   SpO2 96%   BMI 32.88 kg/m  General: Elderly male lying in bed in the ICU on BiPAP.  Slightly anxious in appearance, somewhat restless but not acutely in distress.  Answering questions and following commands appropriately.  However he is intermittently reaching for his BiPAP mask. Eyes: EOMI, clear conjuctivae, white sclerea Cardiovascular: RRR, no murmurs or rubs, no peripheral edema  Respiratory: Equal bilateral breath sounds, though they are distant.  No significant rhonchi or wheezing heard.   Abdomen: soft, nontender, nondistended, normal bowel tones heard  Skin: His  skin looks very dry, he has 1+ pitting edema in the bilateral lower extremities, with significant erythema with clear demarcation in the left lower extremity, and some diffuse erythema of the right lower extremity up to the shin Musculoskeletal: no joint effusions, normal range of motion  Psychiatric: appropriate affect, normal speech  Neurologic: extraocular muscles intact, clear speech, moving all extremities with intact sensorium       Labs on Admission:  Basic Metabolic Panel: Recent Labs  Lab 10/11/2022 1519  NA 140  K 4.8  CL 98  CO2 36*  GLUCOSE 328*  BUN 24*  CREATININE 0.88  CALCIUM 8.7*   Liver Function Tests: Recent Labs  Lab 09/28/2022 1519  AST 17  ALT 21  ALKPHOS 105  BILITOT 0.7  PROT 7.2  ALBUMIN 3.8   No results for input(s): "LIPASE", "AMYLASE" in the last 168 hours. No results for input(s): "AMMONIA" in the last 168 hours. CBC: Recent Labs  Lab 10/07/2022 1519  WBC 15.1*  NEUTROABS 8.3*  HGB 10.9*  HCT 37.4*  MCV 102.2*  PLT 210   Cardiac Enzymes: No results for input(s): "CKTOTAL", "CKMB", "CKMBINDEX", "TROPONINI" in the last 168 hours.  BNP (last 3 results) Recent Labs    10/22/2022 1519  BNP 260.4*    ProBNP (last 3 results) No results for input(s): "PROBNP" in the last 8760 hours.  CBG: Recent Labs  Lab 10/01/2022 1755  GLUCAP 313*    Radiological Exams on Admission: DG Chest 2 View  Result Date: 10/09/2022 CLINICAL DATA:  Shortness of breath. EXAM: CHEST - 2 VIEW COMPARISON:  February 19, 2022 FINDINGS: Tortuosity and calcific atherosclerotic disease of the aorta. Cardiomediastinal silhouette is normal. Mediastinal contours appear intact. Streaky airspace opacities in the lower lobes. Fissural thickening bilaterally. No large pleural effusions are seen. Osseous structures are without acute abnormality. Soft tissues are grossly normal. IMPRESSION: Streaky airspace opacities in bilateral lower lobes may represent asymmetric pulmonary edema or  atypical pneumonia. Bilateral fissural thickening. Electronically Signed   By: Fidela Salisbury M.D.   On: 10/15/2022 15:52    Assessment/Plan Principal Problem: Sepsis-patient is meeting criteria at the time of admission with tachycardia, leukocytosis, source is likely combination of COPD exacerbation (with possible superimposed bacterial pneumonia), as well as cellulitis.  Patient  is hemodynamically stable.  Will check lactate. Acute hypoxic and hypercarbic respiratory failure (HCC) -this likely due to combination of acute exacerbation of COPD, as well as acute on chronic diastolic congestive heart failure. -Inpatient admission to stepdown unit -Continue BiPAP support, recheck ABG in 1 hour  Obesity (BMI 24.2-35.3) -complicates all aspects of care Troponin leak-most likely due to stress of sepsis, patient without chest pain; will trend  Diabetes 1.5, managed as type 2 ([place]) -diabetic diet when eating, continue home glipizide, hold metformin, weight-based sliding scale insulin in the meantime Acute on chronic respiratory failure with hypoxia (HCC) Acute on chronic diastolic CHF (congestive heart failure) (HCC) -heart healthy diabetic diet, continue on IV Lasix daily, follow electrolytes closely.  Continue home cardiac medications, and check 2D echo in the morning.  Will also consider cardiology consultation in the morning. Paroxysmal atrial tachycardia -currently stable, continue home beta-blocker OSA (obstructive sleep apnea) -maintain on BiPAP tonight COPD with acute exacerbation ([place]) -with increased dyspnea, even at rest.  Will treat with IV steroids, scheduled and as needed breathing treatments.  Plan to maintain on BiPAP tonight, wean in the morning if improved.  Likely wean steroids in the next 24 to 48 hours.  Due to severity of his exacerbation, will treat empirically with IV doxycycline and Rocephin. Cellulitis of left leg -mild, continue empiric IV Rocephin and doxycycline  DVT  prophylaxis: Lovenox  Code Status: Patient is DNR, but would be amenable to intubation in case of respiratory decline.  Family Communication: Discussed with his daughter [name] in the ICU waiting room this evening.  Disposition Plan: Inpatient admission stepdown status in the ICU.  Consults called: None  Time spent: 65 minutes  Davie Claud Neva Seat MD [facility] Pager [phone]  If 7PM-7AM, please contact night-coverage [email or web address] Password [facility]  09/28/2022, 6:14 PM

## 2022-09-29 NOTE — Consult Note (Signed)
NAME:  Julian Sparks, MRN:  [id number], DOB:  1946-02-17, LOS: 0 ADMISSION DATE:  10/16/2022, CONSULTATION DATE:  10/23/2022 REFERRING MD:  Sharion Settler NP  REASON FOR CONSULT: Acute on chronic respiratory failure   HPI  77 y.o male with significant PMH of stage III severe COPD on Trelegy vent, chronic respiratory failure with hypoxia and hypercapnia, RLS, OSA, chronic diastolic CHF, type II DM, and HTN, who presented to the ED with chief complaints of shortness of breath associated with productive cough.   ED Course: Initial vital signs showed HR of 101 beats/minute, BP 154/73 mm Hg, the RR 26 breaths/minute, and the oxygen saturation 92% on 4 L and a temperature of 98.5F (36.7C).   Pertinent Labs/Diagnostics Findings: Chemistry: Glucose 328, CO2 36, BUN 24 otherwise unremarkable CMP CBC: WBC: 15.1 Hgb/Hct: 10.9/37.4  Other Lab findings: COVID PCR: Negative, Troponin: 23 BNP: 260 Venous blood Gas result:  pO2 60; pCO2 92; pH 7.25;  HCO3 40.3, %O2 Sat 89.8.  Imaging:  CXR> streaky airspace opacity in bilateral lower lobes concerning for asymmetric pulmonary edema or atypical pneumonia Medications Administered in the ED: DuoNebs, nitroglycerin SL, doxycycline, ceftriaxone, Solu-Medrol 125 mg, Lasix 40 mg IV.  Patient continued to require increased oxygenation up to 6 L via nasal cannula with sats in the 90s.  He was placed on BiPAP and admitted to hospitalist service with suspected ischemic CHF and COPD exacerbation.  Past Medical History  stage III severe COPD on Trelegy vent, chronic respiratory failure with hypoxia and hypercapnia, RLS, OSA, chronic diastolic CHF, type II DM, and HTN  Significant Hospital Events   2/3: Admitted with acute on chronic hypoxic hypercapnic respiratory failure secondary to AECOPD, possible pneumonia and CHF exacerbation.  Patient became severely agitated and removing BiPAP started on Precedex.  PCCM consulted  Consults:  PCCM  Procedures:   None  Significant Diagnostic Tests:  2/3: Chest Xray> IMPRESSION: Streaky airspace opacities in bilateral lower lobes may represent asymmetric pulmonary edema or atypical pneumonia.  Bilateral fissural thickening.  Interim History / Subjective:    Micro Data:  2/3: SARS-CoV-2 PCR> negative 2/3: Influenza PCR> negative 2/3: Blood culture x2> 2/3: Urine Culture> 2/3: MRSA PCR>>  2/3: Strep pneumo urinary antigen> 2/3: Legionella urinary antigen>  Antimicrobials:  Doxycycline 2/3> Ceftriaxone 2/3>  OBJECTIVE  Blood pressure 134/78, pulse (!) 103, temperature (!) 97.5 F (36.4 C), temperature source Axillary, resp. rate 19, height 5\' 6"  (1.676 m), weight 92.4 kg, SpO2 94 %.    FiO2 (%):  [40 %] 40 %   Intake/Output Summary (Last 24 hours) at 09/28/2022 2000 Last data filed at 10/20/2022 1613 Gross per 24 hour  Intake --  Output 300 ml  Net -300 ml   Filed Weights   09/30/2022 1511 10/07/2022 1741  Weight: 93.1 kg 92.4 kg   Physical Examination  GENERAL:77 year-old critically ill patient lying in the bed restless on BiPAP EYES: Pupils equal, round, reactive to light and accommodation. No scleral icterus. Extraocular muscles intact.  HEENT: Head atraumatic, normocephalic. Oropharynx and nasopharynx clear.  NECK:  Supple, no jugular venous distention. No thyroid enlargement, no tenderness.  LUNGS: Decreased breath sounds on the right, mild wheezing and rales No rhonchi or crepitation. Mild use of accessory muscles of respiration.  CARDIOVASCULAR: S1, S2 normal. No murmurs, rubs, or gallops.  GI: Soft, nontender, nondistended. Bowel sounds present. No organomegaly or mass.  MUSCULOSKELETAL: :atraumatic.+1 pitting edema of bilateral lower extremities with mild erythema. Muscle Capillary refill is less than  3 seconds in all extremities. Pulses palpable distally. NEUROLOGIC:The patient is on BiPAP unable to assess orientation. Moves all extremities spontaneously. Sensation is intact  bilaterally.Cranial nerves are intact. Gait deferred.  SKIN: multiple bruising, ecchymoses in bilateral upper and lower extremities, cellulitic changes to bilateral lower extremity  Labs/imaging that I havepersonally reviewed  (right click and "Reselect all SmartList Selections" daily)     Labs   CBC: Recent Labs  Lab 09/27/2022 1519  WBC 15.1*  NEUTROABS 8.3*  HGB 10.9*  HCT 37.4*  MCV 102.2*  PLT 387    Basic Metabolic Panel: Recent Labs  Lab 10/20/2022 1519  NA 140  K 4.8  CL 98  CO2 36*  GLUCOSE 328*  BUN 24*  CREATININE 0.88  CALCIUM 8.7*   GFR: Estimated Creatinine Clearance: 73.6 mL/min (by C-G formula based on SCr of 0.88 mg/dL). Recent Labs  Lab 09/30/2022 1519 10/10/2022 1920  WBC 15.1*  --   LATICACIDVEN  --  1.2    Liver Function Tests: Recent Labs  Lab 09/28/2022 1519  AST 17  ALT 21  ALKPHOS 105  BILITOT 0.7  PROT 7.2  ALBUMIN 3.8   No results for input(s): "LIPASE", "AMYLASE" in the last 168 hours. No results for input(s): "AMMONIA" in the last 168 hours.  ABG    Component Value Date/Time   PHART 7.32 (L) 10/12/2022 1930   PCO2ART 83 (HH) 10/07/2022 1930   PO2ART 53 (L) 10/17/2022 1930   HCO3 42.8 (H) 10/16/2022 1930   O2SAT 87.6 10/18/2022 1930     Coagulation Profile: No results for input(s): "INR", "PROTIME" in the last 168 hours.  Cardiac Enzymes: No results for input(s): "CKTOTAL", "CKMB", "CKMBINDEX", "TROPONINI" in the last 168 hours.  HbA1C: Hemoglobin A1C  Date/Time Value Ref Range Status  12/06/2021 10:54 AM 8.2 (A) 4.0 - 5.6 % Final   Hgb A1c MFr Bld  Date/Time Value Ref Range Status  04/14/2021 05:02 AM 6.8 (H) 4.8 - 5.6 % Final    Comment:    (NOTE) Pre diabetes:          5.7%-6.4%  Diabetes:              >6.4%  Glycemic control for   <7.0% adults with diabetes   01/24/2021 11:32 AM 6.8 (H) 4.8 - 5.6 % Final    Comment:    (NOTE)         Prediabetes: 5.7 - 6.4         Diabetes: >6.4         Glycemic  control for adults with diabetes: <7.0     CBG: Recent Labs  Lab 10/06/2022 1755  GLUCAP 313*    Review of Systems:   UNABLE TO OBTAIN PATIENT IS ALTERED ON BIPAP  Past Medical History  He,  has a past medical history of CHF (congestive heart failure) ([place]), Chronic bronchitis ([place]), COPD (chronic obstructive pulmonary disease) ([place]), Diabetes mellitus without complication ([place]), Diastolic dysfunction, Dyspnea, and Hypertension.   Surgical History    Past Surgical History:  Procedure Laterality Date   CIRCUMCISION     SHOULDER ARTHROSCOPY WITH OPEN ROTATOR CUFF REPAIR Left 06/20/2016   Procedure: SHOULDER ARTHROSCOPY WITH OPEN ROTATOR CUFF REPAIR;  Surgeon: Earnestine Leys, MD;  Location: [facility] ORS;  Service: Orthopedics;  Laterality: Left;   UPPER GI ENDOSCOPY       Social History   reports that he quit smoking about 10 years ago. His smoking use included cigarettes. He has a 106.00 pack-year  smoking history. He has quit using smokeless tobacco.  His smokeless tobacco use included chew. He reports that he does not drink alcohol and does not use drugs.   Family History   His family history includes Cancer in his sister; Diabetes in his brother; Hypertension in his mother.   Allergies Allergies  Allergen Reactions   Aspirin Nausea Only and Other (See Comments)    325 mg aspirin cause stomach upset   Penicillins Rash and Other (See Comments)     Home Medications  Prior to Admission medications   Medication Sig Start Date End Date Taking? Authorizing Provider  ACCU-CHEK AVIVA PLUS test strip TEST BLOOD SUGAR TWICE DAILY 12/25/21   [name], MD  Accu-Chek Softclix Lancets lancets CHECK BLOOD SUGAR TWO TIMES DAILY 01/01/22   [name], MD  albuterol (PROVENTIL) (2.5 MG/3ML) 0.083% nebulizer solution INHALE THE CONTENTS OF 1 VIAL VIA NEBULIZER EVERY 6 HOURS AS NEEDED FOR SHORTNESS OF BREATH OR WHEEZING 09/03/22   Tyler Pita, MD  albuterol (VENTOLIN HFA) 108 (90 Base)  MCG/ACT inhaler Inhale 2 puffs into the lungs every 6 (six) hours as needed for wheezing or shortness of breath. 12/06/21   [name], MD  Alcohol Swabs (DROPSAFE ALCOHOL PREP) 70 % PADS USE AS DIRECTED 03/09/22   [name], MD  aspirin 81 MG EC tablet Take 81 mg by mouth daily. Swallow whole.     [provider]  atorvastatin (LIPITOR) 40 MG tablet TAKE 1 TABLET EVERY DAY 03/13/22   [name], MD  B Complex-C (SUPER B COMPLEX PO) Take 1 tablet by mouth daily.     [provider]  Blood Glucose Monitoring Suppl (ACCU-CHEK AVIVA PLUS) w/Device KIT Check blood sugar BID 11/01/20   [name], MD  Budeson-Glycopyrrol-Formoterol (BREZTRI AEROSPHERE) 160-9-4.8 MCG/ACT AERO Inhale 2 puffs into the lungs in the morning and at bedtime. 12/05/21   Tyler Pita, MD  chlorpheniramine-HYDROcodone [facility] PENNKINETIC ER) 10-8 MG/5ML Take 5 mLs by mouth 2 (two) times daily. 03/07/22   [name], MD  doxycycline (VIBRA-TABS) 100 MG tablet Take 1 tablet (100 mg total) by mouth 2 (two) times daily. 09/03/22   Martyn Ehrich, NP  Dupilumab (DUPIXENT) 300 MG/2ML SOPN Inject 300 mg into the skin every 14 (fourteen) days. 09/14/22   Tyler Pita, MD  empagliflozin (JARDIANCE) 10 MG TABS tablet TAKE 1 TABLET(10 MG) BY MOUTH DAILY 07/10/22   Minna Merritts, MD  furosemide (LASIX) 20 MG tablet TAKE 2 TABLETS EVERY DAY 12/25/21   [name], MD  gabapentin (NEURONTIN) 300 MG capsule TAKE 1 CAPSULE THREE TIMES DAILY 06/18/22   [name], MD  gentamicin cream (GARAMYCIN) 0.1 % Apply 1 Application topically 2 (two) times daily. 04/10/22   Edrick Kins, DPM  glipiZIDE (GLUCOTROL XL) 5 MG 24 hr tablet TAKE 1 TABLET TWICE DAILY 04/09/22   [name], MD  losartan (COZAAR) 25 MG tablet Take 1 tablet (25 mg total) by mouth daily. 04/12/22   Alisa Graff, FNP  metFORMIN (GLUCOPHAGE) 500 MG tablet TAKE 1 TABLET TWICE DAILY 03/13/22   [name], MD  metolazone (ZAROXOLYN) 5  MG tablet For weight 201 or higher take metolazone 5 mg, then 30 minutes later take lasix 40 mg 06/07/21   Minna Merritts, MD  metoprolol succinate (TOPROL-XL) 25 MG 24 hr tablet TAKE 1 TABLET(25 MG) BY MOUTH DAILY 11/13/21   [name], MD  predniSONE (DELTASONE) 10 MG tablet 4 tabs daily for 3 days,  then 3 tabs daily for 3 days, 2 tabs daily for 3 days, then 1 tab daily for 3 days, then stop 09/03/22   Martyn Ehrich, NP  predniSONE (DELTASONE) 5 MG tablet TAKE 2 [place] Patient taking differently: Take 5 mg by mouth daily with breakfast. Weaning. Now taking 1 tablet daily 01/04/22   [name], MD  Pseudoeph-Doxylamine-DM-APAP (NYQUIL PO) Take by mouth as needed.    [provider]  rOPINIRole (REQUIP) 1 MG tablet TAKE 1 TABLET(1 MG) BY MOUTH AT BEDTIME 09/06/22   Tyler Pita, MD  traZODone (DESYREL) 50 MG tablet TAKE 2 TABLETS EVERY DAY 06/07/22   [name], MD    Scheduled Meds:  aspirin EC  81 mg Oral Daily   atorvastatin  40 mg Oral Daily   budesonide (PULMICORT) nebulizer solution  0.25 mg Nebulization BID   Chlorhexidine Gluconate Cloth  6 each Topical Q0600   docusate sodium  100 mg Oral BID   enoxaparin (LOVENOX) injection  40 mg Subcutaneous Q24H   furosemide  40 mg Intravenous Daily   gabapentin  300 mg Oral TID   glipiZIDE  5 mg Oral BID   haloperidol lactate  4 mg Intravenous Once   insulin aspart  0-20 Units Subcutaneous Q4H   ipratropium-albuterol  3 mL Nebulization QID   LORazepam       LORazepam       losartan  25 mg Oral Daily   methylPREDNISolone (SOLU-MEDROL) injection  40 mg Intravenous Q8H   metoprolol succinate  25 mg Oral Daily    morphine injection  2 mg Intravenous Once   mouth rinse  15 mL Mouth Rinse 4 times per day   rOPINIRole  1 mg Oral QHS   Continuous Infusions:  cefTRIAXone (ROCEPHIN)  IV     dexmedetomidine (PRECEDEX) IV infusion 1 mcg/kg/hr (09/30/22 0255)   doxycycline (VIBRAMYCIN) IV      PRN Meds:.acetaminophen **OR** acetaminophen, albuterol, LORazepam, LORazepam, LORazepam, metolazone, morphine injection, nitroGLYCERIN, ondansetron **OR** ondansetron (ZOFRAN) IV, mouth rinse, polyethylene glycol, traZODone   Active Hospital Problem list     Assessment & Plan:  #Acute on Chronic Hypoxic and Hypercapnic Respiratory Failure #Acute Exacerbation of COPD #Pneumonia #CHF exacerbation  Background severe stage III COPD, Asthma, OSA on trilogy vent at home however has noncompliance issues now with hypoxic hypercapnic respiratory failure requiring BiPAP therapy.    -Supplemental O2 as needed to maintain O2 saturations 88 to 92% -BiPAP, wean as tolerated -High risk for intubation -Follow intermittent Chest X-ray & ABG as needed -Bronchodilators and Pulmicort nebs -IV Solu-Medrol 40 mg daily -Check D-dimer also obtain ultrasound of bilateral lower extremity -Antibiotics as below   #Sepsis  #Suspected Pneumonia # Cellulitis of bilateral lower extremity meets SIRS criteria: Heart Rate 102 beats/minute, Respiratory Rate 26 breaths/minute, AMS -F/u cultures, trend lactic/ PCT -Monitor WBC/ fever curve -IV antibiotics Ceftriaxone AND doxycycline -Gentle IVF hydration as needed -Pressors PRN for MAP goal >65 -Strict I/O's  #Acute on chronic Diastolic HFpEF  #Hypertension #PAF Hx: HLD  -Diuresis as BP and renal function permits ~ hold diuresis today 02/03/21 due to soft BP and slight increase in Creatinine -Hold HCTZ, metoprolol. -Continue low dose Losartan -Atorvastatin 40 mg -Repeat 2D Echocardiogram  pending. Previous Echo showed Left ventricular ejection fraction of 50 to 55%.. -Cardiology consult if appropriate  #Acute Metabolic Encephalopathy due to Hypercapnia  -On precedex -Provide supportive care -BiPAP to treat Hypercapnia   # T2DM -CBGs -Sliding scale insulin &  10 units Lantus daily -Follow ICU hyper/hypoglycemia protocol -Hold home  Glipizide  #RLS -Continue Requip  Best practice:  Diet:  NPO Pain/Anxiety/Delirium protocol (if indicated): No VAP protocol (if indicated): Not indicated DVT prophylaxis: LMWH GI prophylaxis: N/A  Glucose control:  SSI Yes Central venous access:  N/A Arterial line:  N/A Foley:  N/A Mobility:  bed rest  PT consulted: N/A Last date of multidisciplinary goals of care discussion [2/4] Code Status:  full code Disposition: ICU   = Goals of Care = Code Status Order: FULL  Primary Emergency Contact: Lenise Arena, Home Phone: [phone] Wishes to pursue full aggressive treatment and intervention options, including CPR and intubation, but goals of care will be addressed on going with family if that should become necessary.  Critical care time: 38 minutes        Rufina Falco DNP, CCRN, FNP-C, AGACNP-BC Acute Care & Family Nurse Practitioner [facility] PCCM on call pager [phone]

## 2022-09-29 NOTE — Progress Notes (Signed)
Transported to CCU on bipap without complications

## 2022-09-29 NOTE — Progress Notes (Signed)
       CROSS COVER NOTE  NAME: Julian Sparks MRN: [id number] DOB : Oct 08, 1945    HPI/Events of Note   Patient with severe COPD and CHF admitted meeting criteria from possible pneumonia, cellulitis and acute on chronic respiratory failure requiring BIPAP who became severely agitated. Hand off to me from Dr Hollice Gong;  Assessment and  Interventions   Assessment: Bedside patient remained agitated. Increased precedex to 1.4 2 mg ativan and 2 mg morphine given. Patient calmed down, better BIPAP compliance ABG done when patient still agitated 7.32-83-53-42.8 BIPAP adjusted 15/5 rate 8, 40%  Daughter updated at bedside   Plan: CCM consulted - discussed case with E. Ouma ACNP Adjust BIPAP rate to 10 Repeat ABG 3 hours PRN ativan and morphine for air hunger, BIPAP tolerance, agitation Precedex infusion remains 1.4 mcg/kg/ h Sats goal 88-90%      Kathlene Cote NP [facility]

## 2022-09-29 NOTE — Progress Notes (Signed)
An overhead page was made to the hospital's IV Nurse at 1900; pt with multiple staff members at the bedside;  pt  needing more iv access; pt noted to have multiple bruises and skin tears with dressings;  pt very anxious, in restraints; able to place a 20ga in the [place]; see flowsheet;  pt with extremely poor peripheral access;  has been assessed by 2 IV Team RNs; suggest central access for this pt.

## 2022-09-30 ENCOUNTER — Inpatient Hospital Stay (HOSPITAL_COMMUNITY)
Admit: 2022-09-30 | Discharge: 2022-09-30 | Disposition: A | Discharge status: Home / Self Care | Payer: Medicare HMO | Attending: Internal Medicine | Admitting: Internal Medicine

## 2022-09-30 ENCOUNTER — Inpatient Hospital Stay: Payer: Medicare HMO

## 2022-09-30 ENCOUNTER — Encounter: Payer: Self-pay | Admitting: Internal Medicine

## 2022-09-30 ENCOUNTER — Inpatient Hospital Stay
Admit: 2022-09-30 | Discharge: 2022-09-30 | Disposition: A | Discharge status: Home / Self Care | Payer: Medicare HMO | Attending: Internal Medicine | Admitting: Internal Medicine

## 2022-09-30 DIAGNOSIS — I5031 Acute diastolic (congestive) heart failure: Secondary | ICD-10-CM | POA: Diagnosis not present

## 2022-09-30 DIAGNOSIS — J441 Chronic obstructive pulmonary disease with (acute) exacerbation: Secondary | ICD-10-CM

## 2022-09-30 DIAGNOSIS — G928 Other toxic encephalopathy: Secondary | ICD-10-CM

## 2022-09-30 DIAGNOSIS — J9622 Acute and chronic respiratory failure with hypercapnia: Secondary | ICD-10-CM

## 2022-09-30 DIAGNOSIS — J9601 Acute respiratory failure with hypoxia: Secondary | ICD-10-CM | POA: Diagnosis not present

## 2022-09-30 DIAGNOSIS — J9621 Acute and chronic respiratory failure with hypoxia: Secondary | ICD-10-CM | POA: Diagnosis not present

## 2022-09-30 DIAGNOSIS — I5033 Acute on chronic diastolic (congestive) heart failure: Secondary | ICD-10-CM

## 2022-09-30 LAB — CBC
HCT: 33.5 % — ABNORMAL LOW (ref 39.0–52.0)
Hemoglobin: 10 g/dL — ABNORMAL LOW (ref 13.0–17.0)
MCH: 29.9 pg (ref 26.0–34.0)
MCHC: 29.9 g/dL — ABNORMAL LOW (ref 30.0–36.0)
MCV: 100.3 fL — ABNORMAL HIGH (ref 80.0–100.0)
Platelets: 208 10*3/uL (ref 150–400)
RBC: 3.34 MIL/uL — ABNORMAL LOW (ref 4.22–5.81)
RDW: 14.9 % (ref 11.5–15.5)
WBC: 9.9 10*3/uL (ref 4.0–10.5)
nRBC: 0 % (ref 0.0–0.2)

## 2022-09-30 LAB — BASIC METABOLIC PANEL
Anion gap: 11 (ref 5–15)
BUN: 32 mg/dL — ABNORMAL HIGH (ref 8–23)
CO2: 34 mmol/L — ABNORMAL HIGH (ref 22–32)
Calcium: 8.9 mg/dL (ref 8.9–10.3)
Chloride: 98 mmol/L (ref 98–111)
Creatinine, Ser: 0.82 mg/dL (ref 0.61–1.24)
GFR, Estimated: >60 mL/min (ref >60)
Glucose, Bld: 121 mg/dL — ABNORMAL HIGH (ref 70–99)
Potassium: 5 mmol/L (ref 3.5–5.1)
Sodium: 143 mmol/L (ref 135–145)

## 2022-09-30 LAB — ECHOCARDIOGRAM COMPLETE
Area-P 1/2: 3.56 cm2
Height: 66 in
S' Lateral: 3.9 cm
Weight: 3259.28 [oz_av]

## 2022-09-30 LAB — STREP PNEUMONIAE URINARY ANTIGEN: Strep Pneumo Urinary Antigen: NEGATIVE

## 2022-09-30 LAB — GLUCOSE, CAPILLARY
Glucose-Capillary: 280 mg/dL — ABNORMAL HIGH (ref 70–99)
Glucose-Capillary: 201 mg/dL — ABNORMAL HIGH (ref 70–99)
Glucose-Capillary: 112 mg/dL — ABNORMAL HIGH (ref 70–99)
Glucose-Capillary: 124 mg/dL — ABNORMAL HIGH (ref 70–99)
Glucose-Capillary: 166 mg/dL — ABNORMAL HIGH (ref 70–99)
Glucose-Capillary: 245 mg/dL — ABNORMAL HIGH (ref 70–99)
Glucose-Capillary: 142 mg/dL — ABNORMAL HIGH (ref 70–99)

## 2022-09-30 LAB — BASIC METABOLIC PANEL WITH GFR
Anion gap: 7 (ref 5–15)
BUN: 30 mg/dL — ABNORMAL HIGH (ref 8–23)
CO2: 37 mmol/L — ABNORMAL HIGH (ref 22–32)
Calcium: 8.7 mg/dL — ABNORMAL LOW (ref 8.9–10.3)
Chloride: 99 mmol/L (ref 98–111)
Creatinine, Ser: 0.89 mg/dL (ref 0.61–1.24)
GFR, Estimated: >60 mL/min
Glucose, Bld: 309 mg/dL — ABNORMAL HIGH (ref 70–99)
Potassium: 5.2 mmol/L — ABNORMAL HIGH (ref 3.5–5.1)
Sodium: 143 mmol/L (ref 135–145)

## 2022-09-30 LAB — BRAIN NATRIURETIC PEPTIDE: B Natriuretic Peptide: 325.4 pg/mL — ABNORMAL HIGH (ref 0.0–100.0)

## 2022-09-30 LAB — PROCALCITONIN: Procalcitonin: 0.11 ng/mL

## 2022-09-30 LAB — MAGNESIUM: Magnesium: 2 mg/dL (ref 1.7–2.4)

## 2022-09-30 LAB — PHOSPHORUS: Phosphorus: 3.6 mg/dL (ref 2.5–4.6)

## 2022-09-30 MED ORDER — INSULIN GLARGINE-YFGN 100 UNIT/ML ~~LOC~~ SOLN
10.0000 [IU] | Freq: Every day | SUBCUTANEOUS | Status: DC
  Administered 2022-09-30 – 2022-10-03 (×4): 10 [IU] via SUBCUTANEOUS
  Filled 2022-09-30 (×4): qty 0.1

## 2022-09-30 MED ORDER — IOHEXOL 350 MG/ML SOLN
75.0000 mL | Freq: Once | INTRAVENOUS | Status: AC | PRN
  Administered 2022-09-30: 75 mL via INTRAVENOUS

## 2022-09-30 MED ORDER — METHYLPREDNISOLONE SODIUM SUCC 40 MG IJ SOLR
40.0000 mg | Freq: Two times a day (BID) | INTRAMUSCULAR | Status: DC
  Administered 2022-09-30 – 2022-10-04 (×8): 40 mg via INTRAVENOUS
  Filled 2022-09-30 (×8): qty 1

## 2022-09-30 MED ORDER — INSULIN ASPART 100 UNIT/ML IJ SOLN
0.0000 [IU] | INTRAMUSCULAR | Status: DC
  Administered 2022-09-30: 11 [IU] via SUBCUTANEOUS
  Administered 2022-09-30: 7 [IU] via SUBCUTANEOUS
  Administered 2022-09-30: 4 [IU] via SUBCUTANEOUS
  Administered 2022-09-30: 7 [IU] via SUBCUTANEOUS
  Administered 2022-09-30: 3 [IU] via SUBCUTANEOUS
  Administered 2022-10-01: 11 [IU] via SUBCUTANEOUS
  Administered 2022-10-01: 4 [IU] via SUBCUTANEOUS
  Administered 2022-10-01: 7 [IU] via SUBCUTANEOUS
  Administered 2022-10-01 – 2022-10-02 (×3): 3 [IU] via SUBCUTANEOUS
  Administered 2022-10-02: 11 [IU] via SUBCUTANEOUS
  Administered 2022-10-02: 3 [IU] via SUBCUTANEOUS
  Administered 2022-10-02: 4 [IU] via SUBCUTANEOUS
  Administered 2022-10-02: 7 [IU] via SUBCUTANEOUS
  Administered 2022-10-03: 11 [IU] via SUBCUTANEOUS
  Administered 2022-10-03: 4 [IU] via SUBCUTANEOUS
  Administered 2022-10-03: 20 [IU] via SUBCUTANEOUS
  Filled 2022-09-30 (×17): qty 1

## 2022-09-30 NOTE — Progress Notes (Signed)
   09/30/22 1156  Vitals  Pulse Rate 78  ECG Heart Rate 79  Resp (!) 22  Oxygen Therapy  SpO2 93 %  O2 Device Bi-PAP  FiO2 (%) 40 %  MEWS Score  MEWS Temp 0  MEWS Systolic 0  MEWS Pulse 0  MEWS RR 1  MEWS LOC 0  MEWS Score 1  MEWS Score Color Green   Patient stated he was having difficulty breathing and increased work of breathing also noted.  RT called and patient placed back on bi-pap.

## 2022-09-30 NOTE — Progress Notes (Incomplete)
Patient went to CT scan with this nurse and transport, in hospital bed, with cardiac and oxygen saturation monitoring.  Patient tolerated trip

## 2022-09-30 NOTE — Progress Notes (Signed)
PROGRESS NOTE  Julian Sparks QZE:[id number] DOB: 09-01-1945 DOA: 09/28/2022 PCP: [name], MD  Hospital Course/Subjective: Julian Sparks is a 77 y.o. male with medical history significant for diastolic congestive heart failure, COPD chronically on 2 to 4 L nasal cannula oxygen, non-insulin-dependent type 2 diabetes, insomnia, obstructive sleep apnea, restless leg syndrome who was admitted to the stepdown unit on the hospitalist service with acute hypoxic and hypercapnic respiratory failure likely due to combination of exacerbation of COPD as well as acute on chronic diastolic congestive heart failure.  He developed severe agitation soon after admission to the critical care unit, was seen by pulmonary critical care due to high risk of intubation.  Patient was seen and examined this morning, remains on BiPAP.  Remains on Precedex drip, which is slowly being weaned.  Patient is hemodynamically stable with some borderline bradycardia due to Precedex..  Patient seen and examined this morning with his ICU nurse at the bedside, also updated daughter at the bedside in the afternoon.  Assessment/Plan: Sepsis-patient is meeting criteria at the time of admission with tachycardia, leukocytosis, source is likely combination of COPD exacerbation (with possible superimposed bacterial pneumonia), as well as cellulitis.  Endorgan dysfunction with metabolic encephalopathy due to severe COPD exacerbation.  Patient is hemodynamically stable. Acute hypoxic and hypercarbic respiratory failure (HCC) -this likely due to combination of acute exacerbation of COPD, as well as acute on chronic diastolic congestive heart failure.  Elevated D-dimer. -Inpatient admission to stepdown unit -Continue BiPAP support, wean as able, PCCM management much appreciated -Stat CTA chest reviewed, no PE  Obesity (BMI 22.6-33.3) -complicates all aspects of care Troponin leak-most likely due to stress of sepsis, patient without chest pain;  will trend  Diabetes 1.5, managed as type 2 ([place]) -diabetic diet when eating, hold metformin and glipizide, weight-based sliding scale insulin in the meantime, along with daily Lantus 10 units Acute on chronic respiratory failure with hypoxia (HCC) Acute on chronic diastolic CHF (congestive heart failure) (HCC) -heart healthy diabetic diet, continue cautious daily 6 diuresis, follow electrolytes closely.  Continue home cardiac medications, and check 2D echo today, results pending Paroxysmal atrial tachycardia -currently stable, continue home beta-blocker OSA (obstructive sleep apnea) -maintain on BiPAP tonight COPD with acute exacerbation ([place]) -with increased dyspnea, even at rest.  Will treat with IV steroids, scheduled and as needed breathing treatments.   Due to severity of his exacerbation, will treat empirically with IV doxycycline and Rocephin. Cellulitis of left leg -mild, continue empiric IV Rocephin and doxycycline   DVT prophylaxis: Lovenox   Code Status: Patient is DNR, but would be amenable to intubation in case of respiratory decline.   Family Communication: Discussed with his daughter [name] in the ICU at the time of admission, also updated this afternoon at the bedside.   Disposition Plan: Inpatient admission stepdown status in the ICU.   Consults: PCCM  Antimicrobials: Anti-infectives (From admission, onward)    Start     Dose/Rate Route Frequency Ordered Stop   09/30/22 1530  cefTRIAXone (ROCEPHIN) 1 g in sodium chloride 0.9 % 100 mL IVPB        1 g 200 mL/hr over 30 Minutes Intravenous Every 24 hours 10/15/2022 1652     09/30/22 0500  doxycycline (VIBRAMYCIN) 100 mg in sodium chloride 0.9 % 250 mL IVPB        100 mg 125 mL/hr over 120 Minutes Intravenous Every 12 hours 10/16/2022 1652     10/04/2022 1545  doxycycline (VIBRAMYCIN) 100 mg in  sodium chloride 0.9 % 250 mL IVPB        100 mg 125 mL/hr over 120 Minutes Intravenous  Once 10/23/2022 1527 10/11/2022 2004   10/17/2022  1530  cefTRIAXone (ROCEPHIN) 1 g in sodium chloride 0.9 % 100 mL IVPB        1 g 200 mL/hr over 30 Minutes Intravenous  Once 10/09/2022 1527 10/20/2022 1613       Objective: Vitals:   09/30/22 1100 09/30/22 1156 09/30/22 1200 09/30/22 1300  BP: 111/65  128/66 128/61  Pulse: (!) 51 78 68 62  Resp: 19 (!) 22 (!) 24 (!) 21  Temp:      TempSrc:      SpO2: 96% 93% 94% 92%  Weight:      Height:        Intake/Output Summary (Last 24 hours) at 09/30/2022 1321 Last data filed at 09/30/2022 1300 Gross per 24 hour  Intake 923.96 ml  Output 1050 ml  Net -126.04 ml   Filed Weights   10/18/2022 1511 10/11/2022 1741  Weight: 93.1 kg 92.4 kg   Exam: General: On BiPAP, following commands.  More interactive this morning.   Eyes: EOMI, clear conjuctivae, white sclerea Neck: supple, no masses, trachea mildline  Cardiovascular: RRR, no murmurs or rubs, no peripheral edema  Respiratory: Breath sounds are distant bilaterally, with no active wheezing or rhonchi abdomen: soft, nontender, nondistended, normal bowel tones heard  Skin: He has progressive warmth and erythema of the right lower extremity, left lower extremity cellulitis is also spread slightly beyond the line marked yesterday Musculoskeletal: no joint effusions, normal range of motion  Psychiatric: appropriate affect, normal speech  Neurologic: extraocular muscles intact, clear speech, moving all extremities with intact sensorium  Data Reviewed: CBC: Recent Labs  Lab 10/11/2022 1519 09/30/22 0425  WBC 15.1* 9.9  NEUTROABS 8.3*  --   HGB 10.9* 10.0*  HCT 37.4* 33.5*  MCV 102.2* 100.3*  PLT 210 081   Basic Metabolic Panel: Recent Labs  Lab 10/20/2022 1519 09/30/22 0425 09/30/22 1213  NA 140 143 143  K 4.8 5.2* 5.0  CL 98 99 98  CO2 36* 37* 34*  GLUCOSE 328* 309* 121*  BUN 24* 30* 32*  CREATININE 0.88 0.89 0.82  CALCIUM 8.7* 8.7* 8.9  MG  --  2.0  --   PHOS  --  3.6  --    GFR: Estimated Creatinine Clearance: 79 mL/min (by  C-G formula based on SCr of 0.82 mg/dL). Liver Function Tests: Recent Labs  Lab 10/24/2022 1519  AST 17  ALT 21  ALKPHOS 105  BILITOT 0.7  PROT 7.2  ALBUMIN 3.8   No results for input(s): "LIPASE", "AMYLASE" in the last 168 hours. No results for input(s): "AMMONIA" in the last 168 hours. Coagulation Profile: No results for input(s): "INR", "PROTIME" in the last 168 hours. Cardiac Enzymes: No results for input(s): "CKTOTAL", "CKMB", "CKMBINDEX", "TROPONINI" in the last 168 hours. BNP (last 3 results) No results for input(s): "PROBNP" in the last 8760 hours. HbA1C: Recent Labs    10/18/2022 1920  HGBA1C 10.1*   CBG: Recent Labs  Lab 10/04/2022 2110 09/30/22 0330 09/30/22 0739 09/30/22 1115 09/30/22 1118  GLUCAP 317* 280* 201* 124* 112*   Lipid Profile: No results for input(s): "CHOL", "HDL", "LDLCALC", "TRIG", "CHOLHDL", "LDLDIRECT" in the last 72 hours. Thyroid Function Tests: No results for input(s): "TSH", "T4TOTAL", "FREET4", "T3FREE", "THYROIDAB" in the last 72 hours. Anemia Panel: No results for input(s): "VITAMINB12", "FOLATE", "FERRITIN", "TIBC", "IRON", "  RETICCTPCT" in the last 72 hours. Urine analysis:    Component Value Date/Time   COLORURINE YELLOW 02/11/2012 [place] 02/11/2012 1608   LABSPEC 1.010 02/11/2012 1608   PHURINE 6.5 02/11/2012 1608   GLUCOSEU NEGATIVE 02/11/2012 1608   HGBUR 2+ 02/11/2012 1608   BILIRUBINUR NEGATIVE 02/11/2012 1608   KETONESUR NEGATIVE 02/11/2012 1608   PROTEINUR NEGATIVE 02/11/2012 1608   NITRITE NEGATIVE 02/11/2012 1608   LEUKOCYTESUR NEGATIVE 02/11/2012 1608   Sepsis Labs: @LABRCNTIP (procalcitonin:4,lacticidven:4)  ) Recent Results (from the past 240 hour(s))  Resp panel by RT-PCR (RSV, Flu A&B, Covid)     Status: None   Collection Time: 10/11/2022  3:42 PM  Result Value Ref Range Status   SARS Coronavirus 2 by RT PCR NEGATIVE NEGATIVE Final    Comment: (NOTE) SARS-CoV-2 target nucleic acids are NOT  DETECTED.  The SARS-CoV-2 RNA is generally detectable in upper respiratory specimens during the acute phase of infection. The lowest concentration of SARS-CoV-2 viral copies this assay can detect is 138 copies/mL. A negative result does not preclude SARS-Cov-2 infection and should not be used as the sole basis for treatment or other patient management decisions. A negative result may occur with  improper specimen collection/handling, submission of specimen other than nasopharyngeal swab, presence of viral mutation(s) within the areas targeted by this assay, and inadequate number of viral copies(<138 copies/mL). A negative result must be combined with clinical observations, patient history, and epidemiological information. The expected result is Negative.  Fact Sheet for Patients:  EntrepreneurPulse.com.au  Fact Sheet for Healthcare Providers:  IncredibleEmployment.be  This test is no t yet approved or cleared by the [place] FDA and  has been authorized for detection and/or diagnosis of SARS-CoV-2 by FDA under an Emergency Use Authorization (EUA). This EUA will remain  in effect (meaning this test can be used) for the duration of the COVID-19 declaration under Section 564(b)(1) of the Act, 21 U.S.C.section 360bbb-3(b)(1), unless the authorization is terminated  or revoked sooner.       Influenza A by PCR NEGATIVE NEGATIVE Final   Influenza B by PCR NEGATIVE NEGATIVE Final    Comment: (NOTE) The Xpert Xpress SARS-CoV-2/FLU/RSV plus assay is intended as an aid in the diagnosis of influenza from Nasopharyngeal swab specimens and should not be used as a sole basis for treatment. Nasal washings and aspirates are unacceptable for Xpert Xpress SARS-CoV-2/FLU/RSV testing.  Fact Sheet for Patients: EntrepreneurPulse.com.au  Fact Sheet for Healthcare Providers: IncredibleEmployment.be  This test is not yet  approved or cleared by the [place] FDA and has been authorized for detection and/or diagnosis of SARS-CoV-2 by FDA under an Emergency Use Authorization (EUA). This EUA will remain in effect (meaning this test can be used) for the duration of the COVID-19 declaration under Section 564(b)(1) of the Act, 21 U.S.C. section 360bbb-3(b)(1), unless the authorization is terminated or revoked.     Resp Syncytial Virus by PCR NEGATIVE NEGATIVE Final    Comment: (NOTE) Fact Sheet for Patients: EntrepreneurPulse.com.au  Fact Sheet for Healthcare Providers: IncredibleEmployment.be  This test is not yet approved or cleared by the [place] FDA and has been authorized for detection and/or diagnosis of SARS-CoV-2 by FDA under an Emergency Use Authorization (EUA). This EUA will remain in effect (meaning this test can be used) for the duration of the COVID-19 declaration under Section 564(b)(1) of the Act, 21 U.S.C. section 360bbb-3(b)(1), unless the authorization is terminated or revoked.  Performed at [facility], [address] [place], [place] [address]   MRSA Next Gen by PCR, Nasal     Status: Abnormal   Collection Time: 10/11/2022  5:48 PM   Specimen: Nasal Mucosa; Nasal Swab  Result Value Ref Range Status   MRSA by PCR Next Gen DETECTED (A) NOT DETECTED Final    Comment: CRITICAL RESULT CALLED TO, READ BACK BY AND VERIFIED WITH: Laurin Coder RN @ 2221 10/20/2022 LFD (NOTE) The GeneXpert MRSA Assay (FDA approved for NASAL specimens only), is one component of a comprehensive MRSA colonization surveillance program. It is not intended to diagnose MRSA infection nor to guide or monitor treatment for MRSA infections. Test performance is not FDA approved in patients less than 67 years old. Performed at [facility], [address] [place], [place] [address]      Studies: CT Angio Chest Pulmonary Embolism (PE) W or WO  Contrast  Result Date: 09/30/2022 CLINICAL DATA:  Hypoxic respiratory failure. Evaluate for pulmonary embolism. History of chronic lymphocytic leukemia/lymphoma. * Tracking Code: BO * EXAM: CT ANGIOGRAPHY CHEST WITH CONTRAST TECHNIQUE: Multidetector CT imaging of the chest was performed using the standard protocol during bolus administration of intravenous contrast. Multiplanar CT image reconstructions and MIPs were obtained to evaluate the vascular anatomy. RADIATION DOSE REDUCTION: This exam was performed according to the departmental dose-optimization program which includes automated exposure control, adjustment of the mA and/or kV according to patient size and/or use of iterative reconstruction technique. CONTRAST:  48mL OMNIPAQUE IOHEXOL 350 MG/ML SOLN COMPARISON:  Plain film 1 day prior.  CT chest 04/13/2021. FINDINGS: Cardiovascular: The quality of this exam for evaluation of pulmonary embolism is good. No evidence of pulmonary embolism. Aortic atherosclerosis. Tortuous thoracic aorta. Moderate cardiomegaly with lipomatous hypertrophy of the interatrial septum. Lad and right coronary artery calcification. Pulmonary artery enlargement, outflow tract 3.1 cm. Mediastinum/Nodes: 1.4 cm node within the azygoesophageal recess measures 1.6 cm on the prior exam. Right infrahilar node of 1.2 cm on 77/4 is similar to on the prior. Right hilar adenopathy is also mild and not significantly changed. Irregularity along the esophageal lumen is eccentric right including on 26/4 and 44/4, most likely related to enteric contents. Lungs/Pleura: Small right pleural effusion is decreased with minimal loculation superiorly and laterally including on 43/4. Advanced centrilobular emphysema. Right apical scarring. Recurrent or chronic right lower lobe dependent atelectasis. Development of a superior segment right lower lobe lung mass which measures 3.0 x 3.1 cm on 60/5. 3.5 cm craniocaudal on 123/7 Areas of subsegmental atelectasis  or scar within the left upper and left lower lobes. Upper Abdomen: Normal imaged portions of the liver, spleen, stomach. Upper abdominal presumed nodal mass of 7.6 cm on 137/4 is grossly similar to on the prior. Left adrenal or abdominal retroperitoneal mass of 4.5 cm on 137/4 is incompletely imaged but grossly similar previously. Musculoskeletal: Osteopenia. Remote left rib fractures. T5 through T7 mild compression deformities. The T7 compression deformity is similar and the T5-6 compression deformities are new since 2022. Review of the MIP images confirms the above findings. IMPRESSION: 1.  No evidence of pulmonary embolism. 2. New superior segment right lower lobe lung mass, highly suspicious for primary bronchogenic carcinoma. Consider multidisciplinary thoracic oncology consultation for eventual PET and tissue sampling. 3. Tiny right pleural effusion with minimal loculation. 4. Relatively similar thoracic adenopathy which is most likely related to the history of leukemia/lymphoma. Marked upper abdominal adenopathy is suboptimally evaluated but grossly similar to 2022. 5. Aortic atherosclerosis (ICD10-I70.0), coronary artery atherosclerosis and emphysema (ICD10-J43.9). Pulmonary  artery enlargement suggests pulmonary arterial hypertension. 6. Probable debris within the esophagus, suggesting dysmotility and/or gastroesophageal reflux. If there are clinical symptoms to suggest underlying esophageal mass, recommend endoscopy. These results will be called to the ordering clinician or representative by the Radiologist Assistant, and communication documented in the PACS or [employer]. Electronically Signed   By: Abigail Miyamoto M.D.   On: 09/30/2022 10:40   US Venous Img Lower Bilateral (DVT)  Result Date: 10/04/2022 CLINICAL DATA:  Swelling EXAM: BILATERAL LOWER EXTREMITY VENOUS DOPPLER ULTRASOUND TECHNIQUE: Gray-scale sonography with compression, as well as color and duplex ultrasound, were performed to  evaluate the deep venous system(s) from the level of the common femoral vein through the popliteal and proximal calf veins. COMPARISON:  None Available. FINDINGS: VENOUS Normal compressibility of the common femoral, superficial femoral, and popliteal veins, as well as the visualized calf veins. Visualized portions of profunda femoral vein and great saphenous vein unremarkable. No filling defects to suggest DVT on grayscale or color Doppler imaging. Doppler waveforms show normal direction of venous flow, normal respiratory plasticity and response to augmentation. Limited views of the contralateral common femoral vein are unremarkable. OTHER None. Limitations: none IMPRESSION: Negative. Electronically Signed   By: Rolm Baptise M.D.   On: 10/01/2022 21:06   DG Chest 2 View  Result Date: 10/03/2022 CLINICAL DATA:  Shortness of breath. EXAM: CHEST - 2 VIEW COMPARISON:  February 19, 2022 FINDINGS: Tortuosity and calcific atherosclerotic disease of the aorta. Cardiomediastinal silhouette is normal. Mediastinal contours appear intact. Streaky airspace opacities in the lower lobes. Fissural thickening bilaterally. No large pleural effusions are seen. Osseous structures are without acute abnormality. Soft tissues are grossly normal. IMPRESSION: Streaky airspace opacities in bilateral lower lobes may represent asymmetric pulmonary edema or atypical pneumonia. Bilateral fissural thickening. Electronically Signed   By: Fidela Salisbury M.D.   On: 10/19/2022 15:52    Scheduled Meds:  aspirin EC  81 mg Oral Daily   atorvastatin  40 mg Oral Daily   budesonide (PULMICORT) nebulizer solution  0.25 mg Nebulization BID   Chlorhexidine Gluconate Cloth  6 each Topical Q0600   docusate sodium  100 mg Oral BID   enoxaparin (LOVENOX) injection  40 mg Subcutaneous Q24H   furosemide  40 mg Intravenous Daily   gabapentin  300 mg Oral TID   insulin aspart  0-20 Units Subcutaneous Q4H   insulin glargine-yfgn  10 Units Subcutaneous  Daily   ipratropium-albuterol  3 mL Nebulization QID   losartan  25 mg Oral Daily   methylPREDNISolone (SOLU-MEDROL) injection  40 mg Intravenous Q12H   metoprolol succinate  25 mg Oral Daily    morphine injection  2 mg Intravenous Once   mouth rinse  15 mL Mouth Rinse 4 times per day   rOPINIRole  1 mg Oral QHS    Continuous Infusions:  cefTRIAXone (ROCEPHIN)  IV     dexmedetomidine (PRECEDEX) IV infusion 1 mcg/kg/hr (09/30/22 1300)   doxycycline (VIBRAMYCIN) IV Stopped (09/30/22 0609)     LOS: 1 day   Time spent: 31 minutes  Oluwafemi Villella Neva Seat, MD [facility] Pager [phone]  If 7PM-7AM, please contact night-coverage [email or web address] Password [email or web address] 09/30/2022, 1:21 PM

## 2022-09-30 NOTE — Progress Notes (Signed)
NAME:  Julian Sparks, MRN:  [id number], DOB:  1946-03-19, LOS: 1 ADMISSION DATE:  10/20/2022, CONSULTATION DATE:  10/23/2022 REFERRING MD:  Sharion Settler NP  REASON FOR CONSULT: Acute on chronic respiratory failure   HPI  77 y.o male with significant PMH of stage III severe COPD on Trelegy vent, chronic respiratory failure with hypoxia and hypercapnia, RLS, OSA, chronic diastolic CHF, type II DM, and HTN, who presented to the ED with chief complaints of shortness of breath associated with productive cough.   ED Course: Initial vital signs showed HR of 101 beats/minute, BP 154/73 mm Hg, the RR 26 breaths/minute, and the oxygen saturation 92% on 4 L and a temperature of 98.3F (36.7C).   Pertinent Labs/Diagnostics Findings: Chemistry: Glucose 328, CO2 36, BUN 24 otherwise unremarkable CMP CBC: WBC: 15.1 Hgb/Hct: 10.9/37.4  Other Lab findings: COVID PCR: Negative, Troponin: 23 BNP: 260 Venous blood Gas result:  pO2 60; pCO2 92; pH 7.25;  HCO3 40.3, %O2 Sat 89.8.  Imaging:  CXR> streaky airspace opacity in bilateral lower lobes concerning for asymmetric pulmonary edema or atypical pneumonia Medications Administered in the ED: DuoNebs, nitroglycerin SL, doxycycline, ceftriaxone, Solu-Medrol 125 mg, Lasix 40 mg IV.  Patient continued to require increased oxygenation up to 6 L via nasal cannula with sats in the 90s.  He was placed on BiPAP and admitted to hospitalist service with suspected ischemic CHF and COPD exacerbation.  Past Medical History  stage III severe COPD on Trelegy vent, chronic respiratory failure with hypoxia and hypercapnia, RLS, OSA, chronic diastolic CHF, type II DM, and HTN  Significant Hospital Events   2/3: Admitted with acute on chronic hypoxic hypercapnic respiratory failure secondary to AECOPD, possible pneumonia and CHF exacerbation.  Patient became severely agitated and removing BiPAP started on Precedex.  PCCM consulted 2/4: Pt weaned off Bipap tolerating nasal  canula.  Currently weaning precedex gtt if able to wean off gtt PCCM team will sign off   Consults:  PCCM  Procedures:  None  Significant Diagnostic Tests:  2/3: Chest Xray> IMPRESSION: Streaky airspace opacities in bilateral lower lobes may represent asymmetric pulmonary edema or atypical pneumonia.  Bilateral fissural thickening.  Interim History / Subjective:  Agitation improved this am currently weaning precedex gtt   Micro Data:  2/3: SARS-CoV-2 PCR> negative 2/3: Influenza PCR> negative 2/3: MRSA PCR>>positive  2/3: Strep pneumo urinary antigen>negative  2/3: Legionella urinary antigen>  Antimicrobials:  Doxycycline 2/3> Ceftriaxone 2/3>  OBJECTIVE  Blood pressure 105/65, pulse (!) 51, temperature 98.3 F (36.8 C), temperature source Axillary, resp. rate 20, height 5\' 6"  (1.676 m), weight 92.4 kg, SpO2 93 %.    FiO2 (%):  [40 %] 40 %   Intake/Output Summary (Last 24 hours) at 09/30/2022 0945 Last data filed at 09/30/2022 0617 Gross per 24 hour  Intake 613.66 ml  Output 1050 ml  Net -436.34 ml   Filed Weights   10/11/2022 1511 10/12/2022 1741  Weight: 93.1 kg 92.4 kg   Physical Examination  GENERAL:: Acute on chronically ill appearing male, NAD on nasal canula HEENT: Head atraumatic, normocephalic. Supple, no JVD. Oropharynx and nasopharynx clear.  LUNGS: Diminished throughout, even, non labored   CARDIOVASCULAR: NSR, s1s2, no m/r/g, 2+ radial/1+ distal pulse, 1+ bilateral lower extremity edema  GI: +BS x4, soft, non tender, non distended  MUSCU: Moves all extremities  NEUROLOGIC: Alert and oriented, following commands, PERRLA  SKIN: Multiple bruising, ecchymoses in bilateral upper and lower extremities, cellulitic changes to bilateral lower extremity  Labs/imaging that I havepersonally reviewed  (right click and "Reselect all SmartList Selections" daily)     Labs   CBC: Recent Labs  Lab 10/15/2022 1519 09/30/22 0425  WBC 15.1* 9.9  NEUTROABS 8.3*  --    HGB 10.9* 10.0*  HCT 37.4* 33.5*  MCV 102.2* 100.3*  PLT 210 664    Basic Metabolic Panel: Recent Labs  Lab 10/04/2022 1519 09/30/22 0425  NA 140 143  K 4.8 5.2*  CL 98 99  CO2 36* 37*  GLUCOSE 328* 309*  BUN 24* 30*  CREATININE 0.88 0.89  CALCIUM 8.7* 8.7*  MG  --  2.0  PHOS  --  3.6   GFR: Estimated Creatinine Clearance: 72.8 mL/min (by C-G formula based on SCr of 0.89 mg/dL). Recent Labs  Lab 10/16/2022 1519 10/13/2022 1920 10/06/2022 1945 10/04/2022 2133 09/30/22 0425  PROCALCITON  --   --  0.12  --  0.11  WBC 15.1*  --   --   --  9.9  LATICACIDVEN  --  1.2  --  1.0  --     Liver Function Tests: Recent Labs  Lab 09/28/2022 1519  AST 17  ALT 21  ALKPHOS 105  BILITOT 0.7  PROT 7.2  ALBUMIN 3.8   No results for input(s): "LIPASE", "AMYLASE" in the last 168 hours. No results for input(s): "AMMONIA" in the last 168 hours.  ABG    Component Value Date/Time   PHART 7.4 10/04/2022 2230   PCO2ART 69 (HH) 10/23/2022 2230   PO2ART 96 10/13/2022 2230   HCO3 42.7 (H) 10/08/2022 2230   O2SAT 99.7 10/03/2022 2230     Coagulation Profile: No results for input(s): "INR", "PROTIME" in the last 168 hours.  Cardiac Enzymes: No results for input(s): "CKTOTAL", "CKMB", "CKMBINDEX", "TROPONINI" in the last 168 hours.  HbA1C: Hemoglobin A1C  Date/Time Value Ref Range Status  12/06/2021 10:54 AM 8.2 (A) 4.0 - 5.6 % Final   Hgb A1c MFr Bld  Date/Time Value Ref Range Status  10/16/2022 07:20 PM 10.1 (H) 4.8 - 5.6 % Final    Comment:    (NOTE) Pre diabetes:          5.7%-6.4%  Diabetes:              >6.4%  Glycemic control for   <7.0% adults with diabetes   04/14/2021 05:02 AM 6.8 (H) 4.8 - 5.6 % Final    Comment:    (NOTE) Pre diabetes:          5.7%-6.4%  Diabetes:              >6.4%  Glycemic control for   <7.0% adults with diabetes     CBG: Recent Labs  Lab 10/01/2022 1755 10/20/2022 2110 09/30/22 0330 09/30/22 0739  GLUCAP 313* 317* 280* 201*     Review of Systems:   UNABLE TO OBTAIN PATIENT IS ALTERED ON BIPAP  Past Medical History  He,  has a past medical history of CHF (congestive heart failure) (HCC), Chronic bronchitis ([place]), Chronic respiratory failure with hypoxia and hypercapnia ([place]), COPD (chronic obstructive pulmonary disease) ([place]), Diabetes mellitus without complication ([place]), Diastolic dysfunction, Dyspnea, and Hypertension.   Surgical History    Past Surgical History:  Procedure Laterality Date   CIRCUMCISION     SHOULDER ARTHROSCOPY WITH OPEN ROTATOR CUFF REPAIR Left 06/20/2016   Procedure: SHOULDER ARTHROSCOPY WITH OPEN ROTATOR CUFF REPAIR;  Surgeon: Earnestine Leys, MD;  Location: [facility] ORS;  Service: Orthopedics;  Laterality: Left;  UPPER GI ENDOSCOPY       Social History   reports that he quit smoking about 10 years ago. His smoking use included cigarettes. He has a 106.00 pack-year smoking history. He has quit using smokeless tobacco.  His smokeless tobacco use included chew. He reports that he does not drink alcohol and does not use drugs.   Family History   His family history includes Cancer in his sister; Diabetes in his brother; Hypertension in his mother.   Allergies Allergies  Allergen Reactions   Aspirin Nausea Only and Other (See Comments)    325 mg aspirin cause stomach upset   Penicillins Rash and Other (See Comments)     Home Medications  Prior to Admission medications   Medication Sig Start Date End Date Taking? Authorizing Provider  ACCU-CHEK AVIVA PLUS test strip TEST BLOOD SUGAR TWICE DAILY 12/25/21   [name], MD  Accu-Chek Softclix Lancets lancets CHECK BLOOD SUGAR TWO TIMES DAILY 01/01/22   [name], MD  albuterol (PROVENTIL) (2.5 MG/3ML) 0.083% nebulizer solution INHALE THE CONTENTS OF 1 VIAL VIA NEBULIZER EVERY 6 HOURS AS NEEDED FOR SHORTNESS OF BREATH OR WHEEZING 09/03/22   Tyler Pita, MD  albuterol (VENTOLIN HFA) 108 (90 Base) MCG/ACT inhaler Inhale 2 puffs into  the lungs every 6 (six) hours as needed for wheezing or shortness of breath. 12/06/21   [name], MD  Alcohol Swabs (DROPSAFE ALCOHOL PREP) 70 % PADS USE AS DIRECTED 03/09/22   [name], MD  aspirin 81 MG EC tablet Take 81 mg by mouth daily. Swallow whole.     [provider]  atorvastatin (LIPITOR) 40 MG tablet TAKE 1 TABLET EVERY DAY 03/13/22   [name], MD  B Complex-C (SUPER B COMPLEX PO) Take 1 tablet by mouth daily.     [provider]  Blood Glucose Monitoring Suppl (ACCU-CHEK AVIVA PLUS) w/Device KIT Check blood sugar BID 11/01/20   [name], MD  Budeson-Glycopyrrol-Formoterol (BREZTRI AEROSPHERE) 160-9-4.8 MCG/ACT AERO Inhale 2 puffs into the lungs in the morning and at bedtime. 12/05/21   Tyler Pita, MD  chlorpheniramine-HYDROcodone [facility] PENNKINETIC ER) 10-8 MG/5ML Take 5 mLs by mouth 2 (two) times daily. 03/07/22   [name], MD  doxycycline (VIBRA-TABS) 100 MG tablet Take 1 tablet (100 mg total) by mouth 2 (two) times daily. 09/03/22   Martyn Ehrich, NP  Dupilumab (DUPIXENT) 300 MG/2ML SOPN Inject 300 mg into the skin every 14 (fourteen) days. 09/14/22   Tyler Pita, MD  empagliflozin (JARDIANCE) 10 MG TABS tablet TAKE 1 TABLET(10 MG) BY MOUTH DAILY 07/10/22   Minna Merritts, MD  furosemide (LASIX) 20 MG tablet TAKE 2 TABLETS EVERY DAY 12/25/21   [name], MD  gabapentin (NEURONTIN) 300 MG capsule TAKE 1 CAPSULE THREE TIMES DAILY 06/18/22   [name], MD  gentamicin cream (GARAMYCIN) 0.1 % Apply 1 Application topically 2 (two) times daily. 04/10/22   Edrick Kins, DPM  glipiZIDE (GLUCOTROL XL) 5 MG 24 hr tablet TAKE 1 TABLET TWICE DAILY 04/09/22   [name], MD  losartan (COZAAR) 25 MG tablet Take 1 tablet (25 mg total) by mouth daily. 04/12/22   Alisa Graff, FNP  metFORMIN (GLUCOPHAGE) 500 MG tablet TAKE 1 TABLET TWICE DAILY 03/13/22   [name], MD  metolazone (ZAROXOLYN) 5 MG tablet For weight 201 or higher  take metolazone 5 mg, then 30 minutes later take lasix 40 mg 06/07/21   Minna Merritts, MD  metoprolol  succinate (TOPROL-XL) 25 MG 24 hr tablet TAKE 1 TABLET(25 MG) BY MOUTH DAILY 11/13/21   [name], MD  predniSONE (DELTASONE) 10 MG tablet 4 tabs daily for 3 days, then 3 tabs daily for 3 days, 2 tabs daily for 3 days, then 1 tab daily for 3 days, then stop 09/03/22   Martyn Ehrich, NP  predniSONE (DELTASONE) 5 MG tablet TAKE 2 [place] Patient taking differently: Take 5 mg by mouth daily with breakfast. Weaning. Now taking 1 tablet daily 01/04/22   [name], MD  Pseudoeph-Doxylamine-DM-APAP (NYQUIL PO) Take by mouth as needed.    [provider]  rOPINIRole (REQUIP) 1 MG tablet TAKE 1 TABLET(1 MG) BY MOUTH AT BEDTIME 09/06/22   Tyler Pita, MD  traZODone (DESYREL) 50 MG tablet TAKE 2 TABLETS EVERY DAY 06/07/22   [name], MD    Scheduled Meds:  aspirin EC  81 mg Oral Daily   atorvastatin  40 mg Oral Daily   budesonide (PULMICORT) nebulizer solution  0.25 mg Nebulization BID   Chlorhexidine Gluconate Cloth  6 each Topical Q0600   docusate sodium  100 mg Oral BID   enoxaparin (LOVENOX) injection  40 mg Subcutaneous Q24H   furosemide  40 mg Intravenous Daily   gabapentin  300 mg Oral TID   haloperidol lactate  4 mg Intravenous Once   insulin aspart  0-20 Units Subcutaneous Q4H   insulin glargine-yfgn  10 Units Subcutaneous Daily   ipratropium-albuterol  3 mL Nebulization QID   losartan  25 mg Oral Daily   methylPREDNISolone (SOLU-MEDROL) injection  40 mg Intravenous Q8H   metoprolol succinate  25 mg Oral Daily    morphine injection  2 mg Intravenous Once   mouth rinse  15 mL Mouth Rinse 4 times per day   rOPINIRole  1 mg Oral QHS   Continuous Infusions:  cefTRIAXone (ROCEPHIN)  IV     dexmedetomidine (PRECEDEX) IV infusion 1 mcg/kg/hr (09/30/22 0617)   doxycycline (VIBRAMYCIN) IV 125 mL/hr at 09/30/22 0500   PRN  Meds:.acetaminophen **OR** acetaminophen, albuterol, iohexol, LORazepam, metolazone, morphine injection, nitroGLYCERIN, ondansetron **OR** ondansetron (ZOFRAN) IV, mouth rinse, polyethylene glycol, traZODone   Active Hospital Problem list     Assessment & Plan:  #Acute on Chronic Hypoxic and Hypercapnic Respiratory Failure #Acute Exacerbation of COPD #Pneumonia #CHF exacerbation - Supplemental O2 prn to maintain O2 saturations 88 to 92% - Bipap prn and qhs  - Follow intermittent Chest X-ray & ABG as needed - Bronchodilators and Pulmicort nebs - IV Solu-Medrol 40 mg bid  - CTA Chest results pending to r/o PE  - Antibiotics as below   #Sepsis  #Suspected Pneumonia # Cellulitis of bilateral lower extremity Met SIRS criteria: Heart Rate 102 beats/minute, Respiratory Rate 26 breaths/minute, AMS - Trend WBC and monitor fever curve  - Trend PCT  - IV antibiotics ceftriaxone and doxycycline  #Acute on chronic Diastolic HFpEF  #Sepsis  #PAF Hx: HLD and HTN  - Continuous telemetry monitoring  - Consider holding outpatient antihypertensives due to soft bp's  - Diuresis as BP and renal function permits ~ Continue lasix daily   - Repeat 2D Echocardiogram  pending  #Hyperkalemia  - Trend BMP  - Replace electrolytes as indicated  - Monitor I&O's   #Acute Metabolic Encephalopathy due to Hypercapnia~improving   - Correct metabolic derangements - Wean precedex gtt as tolerated  - Provide supportive care   # T2DM - CBG q4h  - Continue resistant SSI  and semglee  - Follow ICU hyper/hypoglycemia protocol - Hold home Glipizide  #RLS - Continue Requip  Best practice:  Diet: Carb modified  Pain/Anxiety/Delirium protocol (if indicated): Precedex gtt attempting to wean off  VAP protocol (if indicated): Not indicated DVT prophylaxis: LMWH GI prophylaxis: N/A  Glucose control:  SSI Yes Central venous access:  N/A Arterial line:  N/A Foley:  N/A Mobility: Activity as tolerated  PT  consulted: N/A Last date of multidisciplinary goals of care discussion [09/30/22] Code Status:  full code Disposition: ICU  Critical care time: 32 minutes      Donell Beers, [place] Pager [phone] (please enter 7 digits) PCCM Consult Pager [phone] (please enter 7 digits)

## 2022-09-30 NOTE — Progress Notes (Signed)
  Echocardiogram 2D Echocardiogram has been performed.  Eudelia Bunch Alahna Dunne 09/30/2022, 11:58 AM

## 2022-09-30 NOTE — Plan of Care (Signed)
Continuing with plan of care. 

## 2022-09-30 NOTE — Progress Notes (Signed)
eLink Physician-Brief Progress Note Patient Name: Julian Sparks DOB: 11/02/1945 MRN: [id number]   Date of Service  09/30/2022  HPI/Events of Note  Brief new admit note: 77 yr old male in ICU for  Acute on chronic resp failure with severe COPD, possible PNA, hx of Diastolic CHF requiring BiPAP, nebs, steroids, CAP coverage. Got lasix.  On camera evaluation, tolerating BiPAP. VS stable. Asp precautions Improving pco2BNP 325 only ECHO in AM LA, wbc normal. Procal normal.  2. Anemia, no bleeding.   3. VTE lovenox.  4. DM, on meds, goal < 180.   eICU Interventions       Intervention Category Major Interventions: Respiratory failure - evaluation and management Evaluation Type: New Patient Evaluation  Elmer Sow 09/30/2022, 5:36 AM

## 2022-10-01 ENCOUNTER — Inpatient Hospital Stay: Payer: Medicare HMO

## 2022-10-01 ENCOUNTER — Encounter: Payer: Self-pay | Admitting: Internal Medicine

## 2022-10-01 DIAGNOSIS — I5033 Acute on chronic diastolic (congestive) heart failure: Secondary | ICD-10-CM | POA: Diagnosis not present

## 2022-10-01 DIAGNOSIS — J9601 Acute respiratory failure with hypoxia: Secondary | ICD-10-CM | POA: Diagnosis not present

## 2022-10-01 LAB — CBC
HCT: 32.3 % — ABNORMAL LOW (ref 39.0–52.0)
Hemoglobin: 9.8 g/dL — ABNORMAL LOW (ref 13.0–17.0)
MCH: 30.3 pg (ref 26.0–34.0)
MCHC: 30.3 g/dL (ref 30.0–36.0)
MCV: 100 fL (ref 80.0–100.0)
Platelets: 215 10*3/uL (ref 150–400)
RBC: 3.23 MIL/uL — ABNORMAL LOW (ref 4.22–5.81)
RDW: 14.6 % (ref 11.5–15.5)
WBC: 14.3 10*3/uL — ABNORMAL HIGH (ref 4.0–10.5)
nRBC: 0 % (ref 0.0–0.2)

## 2022-10-01 LAB — GLUCOSE, CAPILLARY
Glucose-Capillary: 130 mg/dL — ABNORMAL HIGH (ref 70–99)
Glucose-Capillary: 144 mg/dL — ABNORMAL HIGH (ref 70–99)
Glucose-Capillary: 197 mg/dL — ABNORMAL HIGH (ref 70–99)
Glucose-Capillary: 198 mg/dL — ABNORMAL HIGH (ref 70–99)
Glucose-Capillary: 202 mg/dL — ABNORMAL HIGH (ref 70–99)
Glucose-Capillary: 265 mg/dL — ABNORMAL HIGH (ref 70–99)
Glucose-Capillary: 94 mg/dL (ref 70–99)

## 2022-10-01 LAB — BASIC METABOLIC PANEL
Anion gap: 11 (ref 5–15)
BUN: 40 mg/dL — ABNORMAL HIGH (ref 8–23)
CO2: 34 mmol/L — ABNORMAL HIGH (ref 22–32)
Calcium: 8.8 mg/dL — ABNORMAL LOW (ref 8.9–10.3)
Chloride: 96 mmol/L — ABNORMAL LOW (ref 98–111)
Creatinine, Ser: 0.81 mg/dL (ref 0.61–1.24)
GFR, Estimated: >60 mL/min (ref >60)
Glucose, Bld: 140 mg/dL — ABNORMAL HIGH (ref 70–99)
Potassium: 4.6 mmol/L (ref 3.5–5.1)
Sodium: 141 mmol/L (ref 135–145)

## 2022-10-01 LAB — PROCALCITONIN: Procalcitonin: 0.11 ng/mL

## 2022-10-01 LAB — LEGIONELLA PNEUMOPHILA SEROGP 1 UR AG: L. pneumophila Serogp 1 Ur Ag: NEGATIVE

## 2022-10-01 MED ORDER — IOHEXOL 300 MG/ML  SOLN
100.0000 mL | Freq: Once | INTRAMUSCULAR | Status: AC | PRN
  Administered 2022-10-01: 100 mL via INTRAVENOUS

## 2022-10-01 MED ORDER — SENNOSIDES-DOCUSATE SODIUM 8.6-50 MG PO TABS
1.0000 | ORAL_TABLET | Freq: Two times a day (BID) | ORAL | Status: DC
  Administered 2022-10-01 – 2022-10-05 (×3): 1 via ORAL
  Filled 2022-10-01 (×5): qty 1

## 2022-10-01 MED ORDER — IOHEXOL 9 MG/ML PO SOLN
500.0000 mL | Freq: Once | ORAL | Status: AC | PRN
  Administered 2022-10-01 (×2): 500 mL via ORAL

## 2022-10-01 MED ORDER — POLYETHYLENE GLYCOL 3350 17 G PO PACK
17.0000 g | PACK | Freq: Every day | ORAL | Status: DC | PRN
  Filled 2022-10-01: qty 1

## 2022-10-01 MED ORDER — ENOXAPARIN SODIUM 60 MG/0.6ML IJ SOSY
0.5000 mg/kg | PREFILLED_SYRINGE | INTRAMUSCULAR | Status: DC
  Administered 2022-10-01: 50 mg via SUBCUTANEOUS
  Filled 2022-10-01 (×2): qty 0.6

## 2022-10-01 MED ORDER — IPRATROPIUM-ALBUTEROL 0.5-2.5 (3) MG/3ML IN SOLN
3.0000 mL | Freq: Three times a day (TID) | RESPIRATORY_TRACT | Status: DC
  Administered 2022-10-01 – 2022-10-04 (×9): 3 mL via RESPIRATORY_TRACT
  Filled 2022-10-01 (×9): qty 3

## 2022-10-01 NOTE — Plan of Care (Signed)
Pt progressing slowly toward goal of plan of care. A/Ox3, VSS all day, follows command. On HFNC most of day and tolerated it well. Pt taking Po fluid and meal without any issues. Abdomen is distended and pt having left flank pain and tenderness, CT abdomen and pelvic completed result pending. Bowel regime stated for constipation. No BM for this shift. Edema to lower extremities improved. Adequate urine output from lasix IV given early.  Family have been at bedside all day and updated.

## 2022-10-01 NOTE — Progress Notes (Signed)
PHARMACIST - PHYSICIAN COMMUNICATION  CONCERNING:  Enoxaparin (Lovenox) for DVT Prophylaxis    RECOMMENDATION: Patient was prescribed enoxaprin 40mg  q24 hours for VTE prophylaxis.   Filed Weights   10/18/2022 1511 10/16/2022 1741 10/01/22 0408  Weight: 93.1 kg (205 lb 4 oz) 92.4 kg (203 lb 11.3 oz) 97.8 kg (215 lb 9.8 oz)    Body mass index is 34.8 kg/m.  Estimated Creatinine Clearance: 82.3 mL/min (by C-G formula based on SCr of 0.81 mg/dL).   Based on [place] patient is candidate for enoxaparin 0.5mg /kg TBW SQ every 24 hours based on BMI being >30.   DESCRIPTION: Pharmacy has adjusted enoxaparin dose per [facility] policy.  Patient is now receiving enoxaparin 0.5 mg/kg every 24 hours    Dallie Piles, PharmD Clinical Pharmacist  10/01/2022 12:51 PM

## 2022-10-01 NOTE — Discharge Instructions (Signed)

## 2022-10-01 NOTE — Progress Notes (Signed)
Pt received 2mg  of morphine for left flank pain and tenderness, pt dropped sat to low 80's and was unable to recover on HF [place] at 13L. Pt was placed back on BIPAP at 50% FiO2 and pt tolerating it well. RT notified.  Early was was choking on his own saliva, had a hard time recovering for almost 5 minis.

## 2022-10-01 NOTE — Progress Notes (Addendum)
PROGRESS NOTE  Julian Sparks KKX:[id number] DOB: 1946-08-01 DOA: 10/10/2022 PCP: Julian Athens, MD  Hospital Course/Subjective: Julian Sparks is a 77 y.o. male with medical history significant for diastolic congestive heart failure, COPD chronically on 2 to 4 L nasal cannula oxygen, non-insulin-dependent type 2 diabetes, insomnia, obstructive sleep apnea, restless leg syndrome who was admitted to the stepdown unit on the hospitalist service with acute hypoxic and hypercapnic respiratory failure likely due to combination of exacerbation of COPD as well as acute on chronic diastolic congestive heart failure.  He developed severe agitation soon after admission to the critical care unit, was seen by pulmonary critical care due to high risk of intubation.  Patient was seen and examined this morning, now on high flow [place] 8L. He is feeling much better, more interactive with me and well oriented.  PM addendum: Notified this afternoon the patient is having some left-sided flank pain. Patient was reexamined, he has some worsening abdominal distention, slight tenderness to palpation on the left flank. Was also able to update his daughter at the bedside. Will start on bowel regimen, he has not had a bowel movement since day of admission. Given concern for pulmonary mass, will also obtain CT abdomen pelvis with contrast.  Assessment/Plan: Sepsis-patient is meeting criteria at the time of admission with tachycardia, leukocytosis, source is likely combination of COPD exacerbation (with possible superimposed bacterial pneumonia), as well as cellulitis.  Endorgan dysfunction with metabolic encephalopathy due to severe COPD exacerbation.  Patient is hemodynamically stable and improved now. Acute hypoxic and hypercarbic respiratory failure (HCC) -this likely due to combination of acute exacerbation of COPD, as well as acute on chronic diastolic congestive heart failure.  Elevated D-dimer on admission. -Continue  BiPAP support at night, PCCM management much appreciated, d/w Dr. Mortimer Sparks this AM Obesity (BMI 16.9-67.8) -complicates all aspects of care Troponin leak-most likely due to stress of sepsis, patient without chest pain; will trend Diabetes mellitus type 1.5 uncontrolled with hyperglycemia  -diabetic diet when eating, hold metformin and glipizide, weight-based sliding scale insulin in the meantime, along with daily Lantus 10 units Acute on chronic respiratory failure with hypoxia (HCC) Acute on chronic diastolic CHF (congestive heart failure) (HCC) -heart healthy diabetic diet, continue cautious daily diuresis, follow electrolytes closely.  Continue home cardiac medications, Echo as below. Will consult cardiology today for management assistance. Paroxysmal atrial tachycardia -currently stable, continue home beta-blocker OSA (obstructive sleep apnea) -maintain on BiPAP tonight COPD with acute exacerbation (HCC) -with increased dyspnea, even at rest, slowly improving.  Will treat with IV steroids, scheduled and as needed breathing treatments.   Due to severity of his exacerbation, will treat empirically with IV doxycycline and Rocephin. Cellulitis of left leg -mild, continue empiric IV Rocephin and doxycycline   DVT prophylaxis: Lovenox   Code Status: Patient is DNR, but would be amenable to intubation in case of respiratory decline.   Family Communication: Discussed with his daughter Julian Sparks in the ICU multiple times since admission.   Disposition Plan: Inpatient admission stepdown status in the ICU.   Consults: PCCM, Cardiology  Antimicrobials: Anti-infectives (From admission, onward)    Start     Dose/Rate Route Frequency Ordered Stop   09/30/22 1530  cefTRIAXone (ROCEPHIN) 1 g in sodium chloride 0.9 % 100 mL IVPB        1 g 200 mL/hr over 30 Minutes Intravenous Every 24 hours 09/30/2022 1652     09/30/22 0500  doxycycline (VIBRAMYCIN) 100 mg in sodium chloride 0.9 % 250  mL IVPB        100  mg 125 mL/hr over 120 Minutes Intravenous Every 12 hours 10/19/2022 1652     10/19/2022 1545  doxycycline (VIBRAMYCIN) 100 mg in sodium chloride 0.9 % 250 mL IVPB        100 mg 125 mL/hr over 120 Minutes Intravenous  Once 09/28/2022 1527 10/07/2022 2004   10/16/2022 1530  cefTRIAXone (ROCEPHIN) 1 g in sodium chloride 0.9 % 100 mL IVPB        1 g 200 mL/hr over 30 Minutes Intravenous  Once 10/10/2022 1527 10/03/2022 1613      Objective: Vitals:   10/01/22 0408 10/01/22 0500 10/01/22 0600 10/01/22 0758  BP:  (!) 126/55 126/66   Pulse: 77 84 80 90  Resp: (!) 23   (!) 22  Temp:      TempSrc:      SpO2: 95% 98% 97% 100%  Weight: 97.8 kg     Height:        Intake/Output Summary (Last 24 hours) at 10/01/2022 0844 Last data filed at 09/30/2022 2300 Gross per 24 hour  Intake 539.98 ml  Output 1200 ml  Net -660.02 ml    Filed Weights   10/02/2022 1511 10/10/2022 1741 10/01/22 0408  Weight: 93.1 kg 92.4 kg 97.8 kg   Exam: General:  Calm, in no acute distress, more alert and oriented today, on high flow [place] 8L Neck: supple, no masses, trachea mildline  Cardiovascular: RRR, no murmurs or rubs, no peripheral edema  Respiratory: clear to auscultation bilaterally with distant sounds, no wheezing or rhonchi Abdomen: soft, nontender, nondistended, normal bowel tones heard  Skin: dry, no rashes  Musculoskeletal: no joint effusions, normal range of motion  Psychiatric: appropriate affect, normal speech  Neurologic: extraocular muscles intact, clear speech, moving all extremities with intact sensorium   Data Reviewed: CBC: Recent Labs  Lab 09/28/2022 1519 09/30/22 0425 10/01/22 0412  WBC 15.1* 9.9 14.3*  NEUTROABS 8.3*  --   --   HGB 10.9* 10.0* 9.8*  HCT 37.4* 33.5* 32.3*  MCV 102.2* 100.3* 100.0  PLT 210 208 030    Basic Metabolic Panel: Recent Labs  Lab 10/14/2022 1519 09/30/22 0425 09/30/22 1213 10/01/22 0412  NA 140 143 143 141  K 4.8 5.2* 5.0 4.6  CL 98 99 98 96*  CO2 36* 37* 34* 34*   GLUCOSE 328* 309* 121* 140*  BUN 24* 30* 32* 40*  CREATININE 0.88 0.89 0.82 0.81  CALCIUM 8.7* 8.7* 8.9 8.8*  MG  --  2.0  --   --   PHOS  --  3.6  --   --     GFR: Estimated Creatinine Clearance: 82.3 mL/min (by C-G formula based on SCr of 0.81 mg/dL). Liver Function Tests: Recent Labs  Lab 10/14/2022 1519  AST 17  ALT 21  ALKPHOS 105  BILITOT 0.7  PROT 7.2  ALBUMIN 3.8    No results for input(s): "LIPASE", "AMYLASE" in the last 168 hours. No results for input(s): "AMMONIA" in the last 168 hours. Coagulation Profile: No results for input(s): "INR", "PROTIME" in the last 168 hours. Cardiac Enzymes: No results for input(s): "CKTOTAL", "CKMB", "CKMBINDEX", "TROPONINI" in the last 168 hours. BNP (last 3 results) No results for input(s): "PROBNP" in the last 8760 hours. HbA1C: Recent Labs    10/08/2022 1920  HGBA1C 10.1*    CBG: Recent Labs  Lab 09/30/22 1632 09/30/22 1929 09/30/22 2320 10/01/22 0322 10/01/22 0733  GLUCAP 166* 245* 142* 130*  94    Lipid Profile: No results for input(s): "CHOL", "HDL", "LDLCALC", "TRIG", "CHOLHDL", "LDLDIRECT" in the last 72 hours. Thyroid Function Tests: No results for input(s): "TSH", "T4TOTAL", "FREET4", "T3FREE", "THYROIDAB" in the last 72 hours. Anemia Panel: No results for input(s): "VITAMINB12", "FOLATE", "FERRITIN", "TIBC", "IRON", "RETICCTPCT" in the last 72 hours. Urine analysis:    Component Value Date/Time   COLORURINE YELLOW 02/11/2012 [place] 02/11/2012 1608   LABSPEC 1.010 02/11/2012 1608   PHURINE 6.5 02/11/2012 1608   GLUCOSEU NEGATIVE 02/11/2012 1608   HGBUR 2+ 02/11/2012 1608   BILIRUBINUR NEGATIVE 02/11/2012 1608   KETONESUR NEGATIVE 02/11/2012 1608   PROTEINUR NEGATIVE 02/11/2012 1608   NITRITE NEGATIVE 02/11/2012 1608   LEUKOCYTESUR NEGATIVE 02/11/2012 1608   Sepsis Labs: @LABRCNTIP (procalcitonin:4,lacticidven:4)  ) Recent Results (from the past 240 hour(s))  Resp panel by  RT-PCR (RSV, Flu A&B, Covid)     Status: None   Collection Time: 10/15/2022  3:42 PM  Result Value Ref Range Status   SARS Coronavirus 2 by RT PCR NEGATIVE NEGATIVE Final    Comment: (NOTE) SARS-CoV-2 target nucleic acids are NOT DETECTED.  The SARS-CoV-2 RNA is generally detectable in upper respiratory specimens during the acute phase of infection. The lowest concentration of SARS-CoV-2 viral copies this assay can detect is 138 copies/mL. A negative result does not preclude SARS-Cov-2 infection and should not be used as the sole basis for treatment or other patient management decisions. A negative result may occur with  improper specimen collection/handling, submission of specimen other than nasopharyngeal swab, presence of viral mutation(s) within the areas targeted by this assay, and inadequate number of viral copies(<138 copies/mL). A negative result must be combined with clinical observations, patient history, and epidemiological information. The expected result is Negative.  Fact Sheet for Patients:  EntrepreneurPulse.com.au  Fact Sheet for Healthcare Providers:  IncredibleEmployment.be  This test is no t yet approved or cleared by the [place] FDA and  has been authorized for detection and/or diagnosis of SARS-CoV-2 by FDA under an Emergency Use Authorization (EUA). This EUA will remain  in effect (meaning this test can be used) for the duration of the COVID-19 declaration under Section 564(b)(1) of the Act, 21 U.S.C.section 360bbb-3(b)(1), unless the authorization is terminated  or revoked sooner.       Influenza A by PCR NEGATIVE NEGATIVE Final   Influenza B by PCR NEGATIVE NEGATIVE Final    Comment: (NOTE) The Xpert Xpress SARS-CoV-2/FLU/RSV plus assay is intended as an aid in the diagnosis of influenza from Nasopharyngeal swab specimens and should not be used as a sole basis for treatment. Nasal washings and aspirates are  unacceptable for Xpert Xpress SARS-CoV-2/FLU/RSV testing.  Fact Sheet for Patients: EntrepreneurPulse.com.au  Fact Sheet for Healthcare Providers: IncredibleEmployment.be  This test is not yet approved or cleared by the [place] FDA and has been authorized for detection and/or diagnosis of SARS-CoV-2 by FDA under an Emergency Use Authorization (EUA). This EUA will remain in effect (meaning this test can be used) for the duration of the COVID-19 declaration under Section 564(b)(1) of the Act, 21 U.S.C. section 360bbb-3(b)(1), unless the authorization is terminated or revoked.     Resp Syncytial Virus by PCR NEGATIVE NEGATIVE Final    Comment: (NOTE) Fact Sheet for Patients: EntrepreneurPulse.com.au  Fact Sheet for Healthcare Providers: IncredibleEmployment.be  This test is not yet approved or cleared by the [place] FDA and has been authorized for detection and/or diagnosis of SARS-CoV-2 by FDA under an  Emergency Use Authorization (EUA). This EUA will remain in effect (meaning this test can be used) for the duration of the COVID-19 declaration under Section 564(b)(1) of the Act, 21 U.S.C. section 360bbb-3(b)(1), unless the authorization is terminated or revoked.  Performed at [facility], [address] [place], [place] [address]   MRSA Next Gen by PCR, Nasal     Status: Abnormal   Collection Time: 10/10/2022  5:48 PM   Specimen: Nasal Mucosa; Nasal Swab  Result Value Ref Range Status   MRSA by PCR Next Gen DETECTED (A) NOT DETECTED Final    Comment: CRITICAL RESULT CALLED TO, READ BACK BY AND VERIFIED WITH: Laurin Coder RN @ 2221 10/08/2022 LFD (NOTE) The GeneXpert MRSA Assay (FDA approved for NASAL specimens only), is one component of a comprehensive MRSA colonization surveillance program. It is not intended to diagnose MRSA infection nor to guide or monitor treatment for MRSA  infections. Test performance is not FDA approved in patients less than 46 years old. Performed at [facility], [address] [place], [place] [address]      Studies: ECHOCARDIOGRAM COMPLETE  Result Date: 09/30/2022    ECHOCARDIOGRAM REPORT   Patient Name:   ARHAM SYMMONDS Date of Exam: 09/30/2022 Medical Rec #:  [id number]     Height:       66.0 in Accession #:    [id number]    Weight:       203.7 lb Date of Birth:  03/30/1946     BSA:          2.015 m Patient Age:    37 years      BP:           111/65 mmHg Patient Gender: M             HR:           76 bpm. Exam Location:  [facility] Procedure: 2D Echo Indications:     CHF I50.31  History:         Patient has prior history of Echocardiogram examinations.  Sonographer:     Wayland Salinas RDCS Referring Phys:  [id number] Emersen Mascari Jerilynn Mages [facility] Diagnosing Phys: Buford Dresser MD  Sonographer Comments: Patient is obese, no subcostal window and Technically difficult study due to poor echo windows. Image acquisition challenging due to uncooperative patient, Image acquisition challenging due to respiratory motion and Image acquisition challenging due to patient body habitus. Exam ended due to patient's respiratory distress and patient unable to continue. IMPRESSIONS  1. Left ventricular ejection fraction, by estimation, is 50 to 55%. The left ventricle has low normal function. The left ventricle has no regional wall motion abnormalities. There is mild left ventricular hypertrophy. Left ventricular diastolic function  could not be evaluated. There is the interventricular septum is flattened in systole, consistent with right ventricular pressure overload.  2. Right ventricular systolic function is moderately reduced. The right ventricular size is severely enlarged. There is moderately elevated pulmonary artery systolic pressure. The estimated right ventricular systolic pressure is 44.0 mmHg.  3. The mitral valve is grossly normal. Trivial mitral valve regurgitation.  No evidence of mitral stenosis.  4. The aortic valve is tricuspid. There is mild calcification of the aortic valve. There is mild thickening of the aortic valve. Aortic valve regurgitation is not visualized. Aortic valve sclerosis is present, with no evidence of aortic valve stenosis. Comparison(s): No significant change from prior study. Conclusion(s)/Recommendation(s): Study stopped early due to patient's clinical condition; limited images. With  estimated RAP (as IVC not visualized), moderate pulmonary hypertension with RV dilation and reduced RV function. FINDINGS  Left Ventricle: Left ventricular ejection fraction, by estimation, is 50 to 55%. The left ventricle has low normal function. The left ventricle has no regional wall motion abnormalities. The left ventricular internal cavity size was normal in size. There is mild left ventricular hypertrophy. The interventricular septum is flattened in systole, consistent with right ventricular pressure overload. Left ventricular diastolic function could not be evaluated. Right Ventricle: The right ventricular size is severely enlarged. Right vetricular wall thickness was not well visualized. Right ventricular systolic function is moderately reduced. There is moderately elevated pulmonary artery systolic pressure. The tricuspid regurgitant velocity is 3.14 m/s, and with an assumed right atrial pressure of 8 mmHg, the estimated right ventricular systolic pressure is 10.9 mmHg. Left Atrium: Left atrial size was normal in size. Right Atrium: Right atrial size was not well visualized. Pericardium: There is no evidence of pericardial effusion. Mitral Valve: The mitral valve is grossly normal. Trivial mitral valve regurgitation. No evidence of mitral valve stenosis. Tricuspid Valve: The tricuspid valve is normal in structure. Tricuspid valve regurgitation is trivial. No evidence of tricuspid stenosis. Aortic Valve: The aortic valve is tricuspid. There is mild calcification of  the aortic valve. There is mild thickening of the aortic valve. Aortic valve regurgitation is not visualized. Aortic valve sclerosis is present, with no evidence of aortic valve stenosis. Pulmonic Valve: The pulmonic valve was not well visualized. Pulmonic valve regurgitation is trivial. No evidence of pulmonic stenosis. Aorta: The aortic root and ascending aorta are structurally normal, with no evidence of dilitation. IAS/Shunts: The interatrial septum was not well visualized. Additional Comments: There is a small pleural effusion in the left lateral region.  LEFT VENTRICLE PLAX 2D LVIDd:         5.60 cm   Diastology LVIDs:         3.90 cm   LV e' medial:   7.83 cm/s LV PW:         1.30 cm   LV E/e' medial: 10.3 LV IVS:        1.30 cm LVOT diam:     2.20 cm LVOT Area:     3.80 cm  LEFT ATRIUM           Index LA diam:      3.90 cm 1.94 cm/m LA Vol (A4C): 36.7 ml 18.21 ml/m                        PULMONIC VALVE AORTA                 PV Vmax:        0.96 m/s Ao Root diam: 3.50 cm PV Peak grad:   3.7 mmHg                       RVOT Peak grad: 0 mmHg  MITRAL VALVE               TRICUSPID VALVE MV Area (PHT): 3.56 cm    TR Peak grad:   39.4 mmHg MV Decel Time: 213 msec    TR Vmax:        314.00 cm/s MV E velocity: 80.70 cm/s MV A velocity: 78.10 cm/s  SHUNTS MV E/A ratio:  1.03        Systemic Diam: 2.20 cm Buford Dresser MD Electronically signed by Buford Dresser MD Signature  Date/Time: 09/30/2022/1:39:59 PM    Final    CT Angio Chest Pulmonary Embolism (PE) W or WO Contrast  Result Date: 09/30/2022 CLINICAL DATA:  Hypoxic respiratory failure. Evaluate for pulmonary embolism. History of chronic lymphocytic leukemia/lymphoma. * Tracking Code: BO * EXAM: CT ANGIOGRAPHY CHEST WITH CONTRAST TECHNIQUE: Multidetector CT imaging of the chest was performed using the standard protocol during bolus administration of intravenous contrast. Multiplanar CT image reconstructions and MIPs were obtained to evaluate  the vascular anatomy. RADIATION DOSE REDUCTION: This exam was performed according to the departmental dose-optimization program which includes automated exposure control, adjustment of the mA and/or kV according to patient size and/or use of iterative reconstruction technique. CONTRAST:  64mL OMNIPAQUE IOHEXOL 350 MG/ML SOLN COMPARISON:  Plain film 1 day prior.  CT chest 04/13/2021. FINDINGS: Cardiovascular: The quality of this exam for evaluation of pulmonary embolism is good. No evidence of pulmonary embolism. Aortic atherosclerosis. Tortuous thoracic aorta. Moderate cardiomegaly with lipomatous hypertrophy of the interatrial septum. Lad and right coronary artery calcification. Pulmonary artery enlargement, outflow tract 3.1 cm. Mediastinum/Nodes: 1.4 cm node within the azygoesophageal recess measures 1.6 cm on the prior exam. Right infrahilar node of 1.2 cm on 77/4 is similar to on the prior. Right hilar adenopathy is also mild and not significantly changed. Irregularity along the esophageal lumen is eccentric right including on 26/4 and 44/4, most likely related to enteric contents. Lungs/Pleura: Small right pleural effusion is decreased with minimal loculation superiorly and laterally including on 43/4. Advanced centrilobular emphysema. Right apical scarring. Recurrent or chronic right lower lobe dependent atelectasis. Development of a superior segment right lower lobe lung mass which measures 3.0 x 3.1 cm on 60/5. 3.5 cm craniocaudal on 123/7 Areas of subsegmental atelectasis or scar within the left upper and left lower lobes. Upper Abdomen: Normal imaged portions of the liver, spleen, stomach. Upper abdominal presumed nodal mass of 7.6 cm on 137/4 is grossly similar to on the prior. Left adrenal or abdominal retroperitoneal mass of 4.5 cm on 137/4 is incompletely imaged but grossly similar previously. Musculoskeletal: Osteopenia. Remote left rib fractures. T5 through T7 mild compression deformities. The T7  compression deformity is similar and the T5-6 compression deformities are new since 2022. Review of the MIP images confirms the above findings. IMPRESSION: 1.  No evidence of pulmonary embolism. 2. New superior segment right lower lobe lung mass, highly suspicious for primary bronchogenic carcinoma. Consider multidisciplinary thoracic oncology consultation for eventual PET and tissue sampling. 3. Tiny right pleural effusion with minimal loculation. 4. Relatively similar thoracic adenopathy which is most likely related to the history of leukemia/lymphoma. Marked upper abdominal adenopathy is suboptimally evaluated but grossly similar to 2022. 5. Aortic atherosclerosis (ICD10-I70.0), coronary artery atherosclerosis and emphysema (ICD10-J43.9). Pulmonary artery enlargement suggests pulmonary arterial hypertension. 6. Probable debris within the esophagus, suggesting dysmotility and/or gastroesophageal reflux. If there are clinical symptoms to suggest underlying esophageal mass, recommend endoscopy. These results will be called to the ordering clinician or representative by the Radiologist Assistant, and communication documented in the PACS or [employer]. Electronically Signed   By: Abigail Miyamoto M.D.   On: 09/30/2022 10:40    Scheduled Meds:  aspirin EC  81 mg Oral Daily   atorvastatin  40 mg Oral Daily   budesonide (PULMICORT) nebulizer solution  0.25 mg Nebulization BID   Chlorhexidine Gluconate Cloth  6 each Topical Q0600   docusate sodium  100 mg Oral BID   enoxaparin (LOVENOX) injection  40 mg Subcutaneous Q24H   furosemide  40 mg Intravenous Daily   gabapentin  300 mg Oral TID   insulin aspart  0-20 Units Subcutaneous Q4H   insulin glargine-yfgn  10 Units Subcutaneous Daily   ipratropium-albuterol  3 mL Nebulization QID   losartan  25 mg Oral Daily   methylPREDNISolone (SOLU-MEDROL) injection  40 mg Intravenous Q12H   metoprolol succinate  25 mg Oral Daily    morphine injection  2 mg  Intravenous Once   mouth rinse  15 mL Mouth Rinse 4 times per day   rOPINIRole  1 mg Oral QHS    Continuous Infusions:  cefTRIAXone (ROCEPHIN)  IV Stopped (09/30/22 1638)   dexmedetomidine (PRECEDEX) IV infusion Stopped (10/01/22 5271)   doxycycline (VIBRAMYCIN) IV 100 mg (10/01/22 0412)     LOS: 2 days   Time spent: 31 minutes  Kanoelani Dobies Neva Seat, MD [facility] Pager [phone]  If 7PM-7AM, please contact night-coverage [email or web address] Password [facility] 10/01/2022, 8:44 AM

## 2022-10-01 NOTE — Inpatient Diabetes Management (Signed)
Inpatient Diabetes Program Recommendations  AACE/ADA: New Consensus Statement on Inpatient Glycemic Control (2015)  Target Ranges:  Prepandial:   less than 140 mg/dL      Peak postprandial:   less than 180 mg/dL (1-2 hours)      Critically ill patients:  140 - 180 mg/dL    Latest Reference Range & Units 10/20/2022 19:20  Hemoglobin A1C 4.8 - 5.6 % 10.1 (H)  243 mg/dl  (H): Data is abnormally high  Latest Reference Range & Units 10/02/2022 21:10 09/30/22 03:30 09/30/22 07:39 09/30/22 11:15 09/30/22 11:18 09/30/22 16:32 09/30/22 19:29  Glucose-Capillary 70 - 99 mg/dL 317 (H)  4 units Novolog  280 (H)  11 units Novolog  201 (H)  7 units Novolog  10 units Semglee @0834  124 (H) 112 (H) 166 (H)  4 units Novolog @1807  245 (H)  7 units Novolog   (H): Data is abnormally high  Latest Reference Range & Units 09/30/22 23:20 10/01/22 03:22  Glucose-Capillary 70 - 99 mg/dL 142 (H)  3 units Novolog  130 (H)  3 units Novolog   (H): Data is abnormally high    Admit Sepsis/ Acute hypoxic and hypercarbic respiratory failure likely due to combination of acute exacerbation of COPD, as well as acute on chronic diastolic congestive heart failure  History: DM2   Home DM Meds: Jardiance 10 mg daily     Glipizide 5 mg BID      Metformin 500 mg BID   Current Orders: Semglee 10 units daily   Novolog 0-20 units Q4H    Getting Solumedrol 40 mg BID  Started Semglee yest AM  Addendum 3:15pm--Met w/ pt and Dtr at bedside.  Pt told me he has CBG meter at home--Checks QAM and usually sees 160-180.  Does not usually check CBGs in the afternoon.  Has NOT been taking the Jardiance listed in home med list for over 2 weeks now--Dtr unaware he was not taking the Jardiance--Pt told me he ran out of the Vania Rea (has been getting the meds for free through a Rx program through his PCP office) and that he cannot afford the meds without the Rx program.  Will ask TOC to see if they can help pt figure out  what the status is with his Jardiance.  Pt told me he takes the [place] and the Metformin on a regular basis.  Has also been taking low dose Prednisone for 6 months now for his breathing.  Spoke with patient and his Dtr about his current A1c of 10.1%.  Explained what an A1c is and what it measures.  Reminded patient that his goal A1c is 7-8% or less per ADA standards to prevent both acute and long-term complications.  Explained to patient the extreme importance of good glucose control at home.  Encouraged patient to check his CBGs at least bid at home (fasting and another check within the day) and to record all CBGs in a logbook for his PCP to review.  Reviewed with pt his admission sugar levels and also reviewed with pt that we are giving him insulin in the hospital while his home oral DM meds are on hold.  Pt agreeable to getting the Insulin herein the hospital to help manage CBGs.   --Will follow patient during hospitalization--  Wyn Quaker RN, MSN, [place] Diabetes Coordinator Inpatient Glycemic Control Team Team Pager: [phone] (8a-5p)

## 2022-10-01 NOTE — Consult Note (Signed)
Cardiology Consultation   Patient ID: FAUSTO SAMPEDRO MRN: [id number]; DOB: Jun 14, 1946  Admit date: 10/07/2022 Date of Consult: 10/01/2022  PCP:  [name], [place] Prov[facility] Cardiologist:  Ida Rogue, MD        Patient Profile:   SHAW DOBEK is a 77 y.o. male with a hx of diastolic congestive heart failure, hypertension, COPD on chronic home O2 2-4L, Type II diabetes, insomnia, OSA, restless leg syndrome, who is being seen 10/01/2022 for the evaluation of diastolic congestive heart failure at the request of Dr. Hollice Gong.  History of Present Illness:   Mr. Wyles is a 76 year old male with a previously mentioned past medical history of diastolic congestive heart failure, hypertension, COPD on chronic oxygen at home 2-4 L, type 2 diabetes, insomnia, obstructive sleep apnea, and restless leg syndrome.  He was last seen in clinic 05/25/2022 by Dr. Rockey Situ.  Stated that he was doing fairly well without any exacerbation in symptoms.  He was using rare metolazone dosage.  Weight had remained stable on average 195-196 pounds at home.  He was continued to be followed by heart failure clinic as well.  He presented to the [facility] emergency department 10/23/2022 with complaints of shortness of breath.  He stated that his shortness of breath was progressively worsening over the last week.  He also endorsed chills but denied fever.  He denied any chest discomfort.  Stated he had been compliant with medications including his diuretics without dietary indiscretions.  He had noticed some increased leg swelling, abdominal swelling and abdominal discomfort, and weight gain.  Hypoxemia was noted in the emergency department and his oxygen requirement was increased to 6 L to maintain oxygen saturations in the mid 90s.  Initial vital signs: Blood pressure 154/73, pulse of 101, respirations of 26, saturations of 92%  Pertinent labs: CO2 36, blood glucose of 328, BUN of 24, calcium 8.7, WBCs  15.1, hemoglobin 10.9, hematocrit 37.4, BNP of 260.4, high-sensitivity troponin of 23, D-dimer 0.93, respiratory panel negative  Medications given in the emergency department: 1 Nitrostat 0.4 mg sublingual, doxycycline 100 mg IVPB, ceftriaxone 1 g IVPB, methylprednisone 125 mg IVP, furosemide 40 mg IVP  Imaging: Chest x-ray revealed streaky airspace opacities in the bilateral lower lobes that were present asymmetric pulmonary edema or atypical pneumonia, bilateral fissural thickening; bilateral lower extremity venous Doppler ultrasound negative; CTA of the chest revealed no evidence of pulmonary embolism, new superior segment right lower lobe lung mass highly suspicious for primary bronchogenic carcinoma consider multidisciplinary thoracic oncology consultation for eventual PET and tissue sampling, tiny right pleural effusion with minimal loculation, electively similar thoracic adenopathy which is most likely related to history of leukemia/lymphoma, marked upper abdominal adenopathy is suboptimally evaluated but grossly similar 2022, aortic atherosclerosis, pulmonary artery enlargement suggest pulmonary arterial hypertension, probable debris within the esophagus suggesting dysmotility and gastroesophageal reflux  Cardiology was consulted on 10/01/2022 to evaluate patient for chronic diastolic congestive heart failure.  Previously he had been followed by PCCM for management of BiPAP.  He had become increasingly agitated and was started on Precedex drip and both were weaned.  Once Precedex and BiPAP were weaned off PCCM signed off.   Past Medical History:  Diagnosis Date   CHF (congestive heart failure) (HCC)    Chronic bronchitis (HCC)    Chronic respiratory failure with hypoxia and hypercapnia (HCC)    non compliant with trilogy   COPD (chronic obstructive pulmonary disease) ([place])    Diabetes mellitus without  complication (HCC)    Diastolic dysfunction    Dyspnea    with exertion   Hypertension      Past Surgical History:  Procedure Laterality Date   CIRCUMCISION     SHOULDER ARTHROSCOPY WITH OPEN ROTATOR CUFF REPAIR Left 06/20/2016   Procedure: SHOULDER ARTHROSCOPY WITH OPEN ROTATOR CUFF REPAIR;  Surgeon: Earnestine Leys, MD;  Location: [facility] ORS;  Service: Orthopedics;  Laterality: Left;   UPPER GI ENDOSCOPY       Home Medications:  Prior to Admission medications   Medication Sig Start Date End Date Taking? Authorizing Provider  ACCU-CHEK AVIVA PLUS test strip TEST BLOOD SUGAR TWICE DAILY 12/25/21   [name], MD  Accu-Chek Softclix Lancets lancets CHECK BLOOD SUGAR TWO TIMES DAILY 01/01/22   [name], MD  albuterol (PROVENTIL) (2.5 MG/3ML) 0.083% nebulizer solution INHALE THE CONTENTS OF 1 VIAL VIA NEBULIZER EVERY 6 HOURS AS NEEDED FOR SHORTNESS OF BREATH OR WHEEZING 09/03/22   Tyler Pita, MD  albuterol (VENTOLIN HFA) 108 (90 Base) MCG/ACT inhaler Inhale 2 puffs into the lungs every 6 (six) hours as needed for wheezing or shortness of breath. 12/06/21   [name], MD  Alcohol Swabs (DROPSAFE ALCOHOL PREP) 70 % PADS USE AS DIRECTED 03/09/22   [name], MD  aspirin 81 MG EC tablet Take 81 mg by mouth daily. Swallow whole.     [provider]  atorvastatin (LIPITOR) 40 MG tablet TAKE 1 TABLET EVERY DAY 03/13/22   [name], MD  B Complex-C (SUPER B COMPLEX PO) Take 1 tablet by mouth daily.     [provider]  Blood Glucose Monitoring Suppl (ACCU-CHEK AVIVA PLUS) w/Device KIT Check blood sugar BID 11/01/20   [name], MD  Budeson-Glycopyrrol-Formoterol (BREZTRI AEROSPHERE) 160-9-4.8 MCG/ACT AERO Inhale 2 puffs into the lungs in the morning and at bedtime. 12/05/21   Tyler Pita, MD  chlorpheniramine-HYDROcodone [facility] PENNKINETIC ER) 10-8 MG/5ML Take 5 mLs by mouth 2 (two) times daily. 03/07/22   [name], MD  doxycycline (VIBRA-TABS) 100 MG tablet Take 1 tablet (100 mg total) by mouth 2 (two) times daily. 09/03/22   Martyn Ehrich, NP  Dupilumab (DUPIXENT) 300 MG/2ML SOPN Inject 300 mg into the skin every 14 (fourteen) days. 09/14/22   Tyler Pita, MD  empagliflozin (JARDIANCE) 10 MG TABS tablet TAKE 1 TABLET(10 MG) BY MOUTH DAILY 07/10/22   Minna Merritts, MD  furosemide (LASIX) 20 MG tablet TAKE 2 TABLETS EVERY DAY 12/25/21   [name], MD  gabapentin (NEURONTIN) 300 MG capsule TAKE 1 CAPSULE THREE TIMES DAILY 06/18/22   [name], MD  gentamicin cream (GARAMYCIN) 0.1 % Apply 1 Application topically 2 (two) times daily. 04/10/22   Edrick Kins, DPM  glipiZIDE (GLUCOTROL XL) 5 MG 24 hr tablet TAKE 1 TABLET TWICE DAILY 04/09/22   [name], MD  losartan (COZAAR) 25 MG tablet Take 1 tablet (25 mg total) by mouth daily. 04/12/22   Alisa Graff, FNP  metFORMIN (GLUCOPHAGE) 500 MG tablet TAKE 1 TABLET TWICE DAILY 03/13/22   [name], MD  metolazone (ZAROXOLYN) 5 MG tablet For weight 201 or higher take metolazone 5 mg, then 30 minutes later take lasix 40 mg 06/07/21   Minna Merritts, MD  metoprolol succinate (TOPROL-XL) 25 MG 24 hr tablet TAKE 1 TABLET(25 MG) BY MOUTH DAILY 11/13/21   [name], MD  predniSONE (DELTASONE) 10 MG tablet 4 tabs daily for 3 days, then 3 tabs daily for  3 days, 2 tabs daily for 3 days, then 1 tab daily for 3 days, then stop 09/03/22   Martyn Ehrich, NP  predniSONE (DELTASONE) 5 MG tablet TAKE 2 [place] Patient taking differently: Take 5 mg by mouth daily with breakfast. Weaning. Now taking 1 tablet daily 01/04/22   [name], MD  Pseudoeph-Doxylamine-DM-APAP (NYQUIL PO) Take by mouth as needed.    [provider]  rOPINIRole (REQUIP) 1 MG tablet TAKE 1 TABLET(1 MG) BY MOUTH AT BEDTIME 09/06/22   Tyler Pita, MD  traZODone (DESYREL) 50 MG tablet TAKE 2 TABLETS EVERY DAY 06/07/22   [name], MD    Inpatient Medications: Scheduled Meds:  aspirin EC  81 mg Oral Daily   atorvastatin  40 mg Oral Daily   budesonide  (PULMICORT) nebulizer solution  0.25 mg Nebulization BID   Chlorhexidine Gluconate Cloth  6 each Topical Q0600   docusate sodium  100 mg Oral BID   enoxaparin (LOVENOX) injection  40 mg Subcutaneous Q24H   furosemide  40 mg Intravenous Daily   gabapentin  300 mg Oral TID   insulin aspart  0-20 Units Subcutaneous Q4H   insulin glargine-yfgn  10 Units Subcutaneous Daily   ipratropium-albuterol  3 mL Nebulization QID   losartan  25 mg Oral Daily   methylPREDNISolone (SOLU-MEDROL) injection  40 mg Intravenous Q12H   metoprolol succinate  25 mg Oral Daily    morphine injection  2 mg Intravenous Once   mouth rinse  15 mL Mouth Rinse 4 times per day   rOPINIRole  1 mg Oral QHS   Continuous Infusions:  cefTRIAXone (ROCEPHIN)  IV Stopped (09/30/22 1638)   dexmedetomidine (PRECEDEX) IV infusion Stopped (10/01/22 4098)   doxycycline (VIBRAMYCIN) IV 100 mg (10/01/22 0412)   PRN Meds: acetaminophen **OR** acetaminophen, albuterol, LORazepam, metolazone, morphine injection, nitroGLYCERIN, ondansetron **OR** ondansetron (ZOFRAN) IV, mouth rinse, polyethylene glycol, traZODone  Allergies:    Allergies  Allergen Reactions   Aspirin Nausea Only and Other (See Comments)    325 mg aspirin cause stomach upset   Penicillins Rash and Other (See Comments)    Social History:   Social History   Socioeconomic History   Marital status: Widowed    Spouse name: Not on file   Number of children: 2   Years of education: 9   Highest education level: Some college, no degree  Occupational History   Not on file  Tobacco Use   Smoking status: Former    Packs/day: 2.00    Years: 53.00    Total pack years: 106.00    Types: Cigarettes    Quit date: 2014    Years since quitting: 10.1   Smokeless tobacco: Former    Types: Nurse, children's Use: Never used  Substance and Sexual Activity   Alcohol use: No   Drug use: No   Sexual activity: Not Currently  Other Topics Concern   Not on file   Social History Narrative   Not on file   Social Determinants of Health   Financial Resource Strain: Low Risk  (06/20/2022)   Overall Financial Resource Strain (CARDIA)    Difficulty of Paying Living Expenses: Not hard at all  Food Insecurity: No Food Insecurity (06/20/2022)   Hunger Vital Sign    Worried About Running Out of Food in the Last Year: Never true    Ran Out of Food in the Last Year: Never true  Transportation Needs: No  Transportation Needs (06/20/2022)   PRAPARE - Hydrologist (Medical): No    Lack of Transportation (Non-Medical): No  Physical Activity: Sufficiently Active (06/20/2022)   Exercise Vital Sign    Days of Exercise per Week: 7 days    Minutes of Exercise per Session: 40 min  Stress: No Stress Concern Present (06/20/2022)   [place]    Feeling of Stress : Not at all  Social Connections: Socially Isolated (06/20/2022)   Social Connection and Isolation Panel [NHANES]    Frequency of Communication with Friends and Family: More than three times a week    Frequency of Social Gatherings with Friends and Family: More than three times a week    Attends Religious Services: Never    Marine scientist or Organizations: No    Attends Archivist Meetings: Never    Marital Status: Widowed  Intimate Partner Violence: Not At Risk (06/20/2022)   Humiliation, Afraid, Rape, and Kick questionnaire    Fear of Current or Ex-Partner: No    Emotionally Abused: No    Physically Abused: No    Sexually Abused: No    Family History:    Family History  Problem Relation Age of Onset   Hypertension Mother    Cancer Sister    Diabetes Brother      ROS:  Please see the history of present illness.  Review of Systems  Constitutional:  Positive for chills and malaise/fatigue.  Respiratory:  Positive for cough and shortness of breath.   Cardiovascular:  Positive for  leg swelling.  Gastrointestinal:  Positive for abdominal pain.  Neurological:  Positive for weakness.  Psychiatric/Behavioral:  The patient has insomnia.     All other ROS reviewed and negative.     Physical Exam/Data:   Vitals:   10/01/22 0408 10/01/22 0500 10/01/22 0600 10/01/22 0758  BP:  (!) 126/55 126/66   Pulse: 77 84 80 90  Resp: (!) 23   (!) 22  Temp:      TempSrc:      SpO2: 95% 98% 97% 100%  Weight: 97.8 kg     Height:        Intake/Output Summary (Last 24 hours) at 10/01/2022 0853 Last data filed at 09/30/2022 2300 Gross per 24 hour  Intake 539.98 ml  Output 1200 ml  Net -660.02 ml      10/01/2022    4:08 AM 10/16/2022    5:41 PM 10/16/2022    3:11 PM  Last 3 Weights  Weight (lbs) 215 lb 9.8 oz 203 lb 11.3 oz 205 lb 4 oz  Weight (kg) 97.8 kg 92.4 kg 93.1 kg     Body mass index is 34.8 kg/m.  General:  Well nourished, well developed, in no acute distress. Sitting up in bed eating breakfast HEENT: normal Neck: no JVD Vascular: No carotid bruits; Distal pulses 2+ bilaterally Cardiac:  normal S1, S2; RRR; no murmur, distant heart sounds Lungs:  diminished to auscultation bilaterally, respirations are unlabored at rest on 13L HFNC Abd: soft, nontender, obese,no hepatomegaly  Ext: 1+ pitting edema to BLE, redness and venous discoloration noted to BLE Musculoskeletal:  No deformities, BUE and BLE strength normal and equal Skin: warm and dry, multiple various stages of healing bruises noted to the BUE, dressing to the right forearm, C?D?I not removed at this time Neuro:  CNs 2-12 intact, no focal abnormalities noted Psych:  Normal affect  EKG:  The EKG was personally reviewed and demonstrates:  on 10/18/2022 sinus tachycardia rate of 101, PAC's, left anterior fascicular block, and incomplete right bundle branch block Telemetry:  Telemetry was personally reviewed and demonstrates:  sinus brady to sinus rates 50-80  Relevant CV Studies: TTE 09/30/22  1. Left ventricular  ejection fraction, by estimation, is 50 to 55%. The  left ventricle has low normal function. The left ventricle has no regional  wall motion abnormalities. There is mild left ventricular hypertrophy.  Left ventricular diastolic function   could not be evaluated. There is the interventricular septum is flattened  in systole, consistent with right ventricular pressure overload.   2. Right ventricular systolic function is moderately reduced. The right  ventricular size is severely enlarged. There is moderately elevated  pulmonary artery systolic pressure. The estimated right ventricular  systolic pressure is 03.5 mmHg.   3. The mitral valve is grossly normal. Trivial mitral valve  regurgitation. No evidence of mitral stenosis.   4. The aortic valve is tricuspid. There is mild calcification of the  aortic valve. There is mild thickening of the aortic valve. Aortic valve  regurgitation is not visualized. Aortic valve sclerosis is present, with  no evidence of aortic valve stenosis.   Laboratory Data:  High Sensitivity Troponin:   Recent Labs  Lab 09/30/2022 1519 10/14/2022 1816  TROPONINIHS 23* 21*     Chemistry Recent Labs  Lab 09/30/22 0425 09/30/22 1213 10/01/22 0412  NA 143 143 141  K 5.2* 5.0 4.6  CL 99 98 96*  CO2 37* 34* 34*  GLUCOSE 309* 121* 140*  BUN 30* 32* 40*  CREATININE 0.89 0.82 0.81  CALCIUM 8.7* 8.9 8.8*  MG 2.0  --   --   GFRNONAA >60 >60 >60  ANIONGAP 7 11 11     Recent Labs  Lab 10/12/2022 1519  PROT 7.2  ALBUMIN 3.8  AST 17  ALT 21  ALKPHOS 105  BILITOT 0.7   Lipids No results for input(s): "CHOL", "TRIG", "HDL", "LABVLDL", "LDLCALC", "CHOLHDL" in the last 168 hours.  Hematology Recent Labs  Lab 10/01/2022 1519 09/30/22 0425 10/01/22 0412  WBC 15.1* 9.9 14.3*  RBC 3.66* 3.34* 3.23*  HGB 10.9* 10.0* 9.8*  HCT 37.4* 33.5* 32.3*  MCV 102.2* 100.3* 100.0  MCH 29.8 29.9 30.3  MCHC 29.1* 29.9* 30.3  RDW 15.0 14.9 14.6  PLT 210 208 215   Thyroid  No results for input(s): "TSH", "FREET4" in the last 168 hours.  BNP Recent Labs  Lab 10/20/2022 1519 09/30/22 0425  BNP 260.4* 325.4*    DDimer  Recent Labs  Lab 10/12/2022 1945  DDIMER 0.93*     Radiology/Studies:  ECHOCARDIOGRAM COMPLETE  Result Date: 09/30/2022    ECHOCARDIOGRAM REPORT   Patient Name:   BREYDAN SHILLINGBURG Date of Exam: 09/30/2022 Medical Rec #:  [id number]     Height:       66.0 in Accession #:    [id number]    Weight:       203.7 lb Date of Birth:  [birth year]     BSA:          2.015 m Patient Age:    30 years      BP:           111/65 mmHg Patient Gender: M             HR:           76 bpm. Exam Location:  [facility] Procedure:  2D Echo Indications:     CHF I50.31  History:         Patient has prior history of Echocardiogram examinations.  Sonographer:     Wayland Salinas RDCS Referring Phys:  [id number] MIR Jerilynn Mages [facility] Diagnosing Phys: Buford Dresser MD  Sonographer Comments: Patient is obese, no subcostal window and Technically difficult study due to poor echo windows. Image acquisition challenging due to uncooperative patient, Image acquisition challenging due to respiratory motion and Image acquisition challenging due to patient body habitus. Exam ended due to patient's respiratory distress and patient unable to continue. IMPRESSIONS  1. Left ventricular ejection fraction, by estimation, is 50 to 55%. The left ventricle has low normal function. The left ventricle has no regional wall motion abnormalities. There is mild left ventricular hypertrophy. Left ventricular diastolic function  could not be evaluated. There is the interventricular septum is flattened in systole, consistent with right ventricular pressure overload.  2. Right ventricular systolic function is moderately reduced. The right ventricular size is severely enlarged. There is moderately elevated pulmonary artery systolic pressure. The estimated right ventricular systolic pressure is 02.4 mmHg.  3. The mitral valve is grossly  normal. Trivial mitral valve regurgitation. No evidence of mitral stenosis.  4. The aortic valve is tricuspid. There is mild calcification of the aortic valve. There is mild thickening of the aortic valve. Aortic valve regurgitation is not visualized. Aortic valve sclerosis is present, with no evidence of aortic valve stenosis. Comparison(s): No significant change from prior study. Conclusion(s)/Recommendation(s): Study stopped early due to patient's clinical condition; limited images. With estimated RAP (as IVC not visualized), moderate pulmonary hypertension with RV dilation and reduced RV function. FINDINGS  Left Ventricle: Left ventricular ejection fraction, by estimation, is 50 to 55%. The left ventricle has low normal function. The left ventricle has no regional wall motion abnormalities. The left ventricular internal cavity size was normal in size. There is mild left ventricular hypertrophy. The interventricular septum is flattened in systole, consistent with right ventricular pressure overload. Left ventricular diastolic function could not be evaluated. Right Ventricle: The right ventricular size is severely enlarged. Right vetricular wall thickness was not well visualized. Right ventricular systolic function is moderately reduced. There is moderately elevated pulmonary artery systolic pressure. The tricuspid regurgitant velocity is 3.14 m/s, and with an assumed right atrial pressure of 8 mmHg, the estimated right ventricular systolic pressure is 09.7 mmHg. Left Atrium: Left atrial size was normal in size. Right Atrium: Right atrial size was not well visualized. Pericardium: There is no evidence of pericardial effusion. Mitral Valve: The mitral valve is grossly normal. Trivial mitral valve regurgitation. No evidence of mitral valve stenosis. Tricuspid Valve: The tricuspid valve is normal in structure. Tricuspid valve regurgitation is trivial. No evidence of tricuspid stenosis. Aortic Valve: The aortic valve  is tricuspid. There is mild calcification of the aortic valve. There is mild thickening of the aortic valve. Aortic valve regurgitation is not visualized. Aortic valve sclerosis is present, with no evidence of aortic valve stenosis. Pulmonic Valve: The pulmonic valve was not well visualized. Pulmonic valve regurgitation is trivial. No evidence of pulmonic stenosis. Aorta: The aortic root and ascending aorta are structurally normal, with no evidence of dilitation. IAS/Shunts: The interatrial septum was not well visualized. Additional Comments: There is a small pleural effusion in the left lateral region.  LEFT VENTRICLE PLAX 2D LVIDd:         5.60 cm   Diastology LVIDs:         3.90  cm   LV e' medial:   7.83 cm/s LV PW:         1.30 cm   LV E/e' medial: 10.3 LV IVS:        1.30 cm LVOT diam:     2.20 cm LVOT Area:     3.80 cm  LEFT ATRIUM           Index LA diam:      3.90 cm 1.94 cm/m LA Vol (A4C): 36.7 ml 18.21 ml/m                        PULMONIC VALVE AORTA                 PV Vmax:        0.96 m/s Ao Root diam: 3.50 cm PV Peak grad:   3.7 mmHg                       RVOT Peak grad: 0 mmHg  MITRAL VALVE               TRICUSPID VALVE MV Area (PHT): 3.56 cm    TR Peak grad:   39.4 mmHg MV Decel Time: 213 msec    TR Vmax:        314.00 cm/s MV E velocity: 80.70 cm/s MV A velocity: 78.10 cm/s  SHUNTS MV E/A ratio:  1.03        Systemic Diam: 2.20 cm Buford Dresser MD Electronically signed by Buford Dresser MD Signature Date/Time: 09/30/2022/1:39:59 PM    Final    CT Angio Chest Pulmonary Embolism (PE) W or WO Contrast  Result Date: 09/30/2022 CLINICAL DATA:  Hypoxic respiratory failure. Evaluate for pulmonary embolism. History of chronic lymphocytic leukemia/lymphoma. * Tracking Code: BO * EXAM: CT ANGIOGRAPHY CHEST WITH CONTRAST TECHNIQUE: Multidetector CT imaging of the chest was performed using the standard protocol during bolus administration of intravenous contrast. Multiplanar CT image  reconstructions and MIPs were obtained to evaluate the vascular anatomy. RADIATION DOSE REDUCTION: This exam was performed according to the departmental dose-optimization program which includes automated exposure control, adjustment of the mA and/or kV according to patient size and/or use of iterative reconstruction technique. CONTRAST:  76mL OMNIPAQUE IOHEXOL 350 MG/ML SOLN COMPARISON:  Plain film 1 day prior.  CT chest 04/13/2021. FINDINGS: Cardiovascular: The quality of this exam for evaluation of pulmonary embolism is good. No evidence of pulmonary embolism. Aortic atherosclerosis. Tortuous thoracic aorta. Moderate cardiomegaly with lipomatous hypertrophy of the interatrial septum. Lad and right coronary artery calcification. Pulmonary artery enlargement, outflow tract 3.1 cm. Mediastinum/Nodes: 1.4 cm node within the azygoesophageal recess measures 1.6 cm on the prior exam. Right infrahilar node of 1.2 cm on 77/4 is similar to on the prior. Right hilar adenopathy is also mild and not significantly changed. Irregularity along the esophageal lumen is eccentric right including on 26/4 and 44/4, most likely related to enteric contents. Lungs/Pleura: Small right pleural effusion is decreased with minimal loculation superiorly and laterally including on 43/4. Advanced centrilobular emphysema. Right apical scarring. Recurrent or chronic right lower lobe dependent atelectasis. Development of a superior segment right lower lobe lung mass which measures 3.0 x 3.1 cm on 60/5. 3.5 cm craniocaudal on 123/7 Areas of subsegmental atelectasis or scar within the left upper and left lower lobes. Upper Abdomen: Normal imaged portions of the liver, spleen, stomach. Upper abdominal presumed nodal mass of 7.6 cm on 137/4 is grossly similar  to on the prior. Left adrenal or abdominal retroperitoneal mass of 4.5 cm on 137/4 is incompletely imaged but grossly similar previously. Musculoskeletal: Osteopenia. Remote left rib fractures. T5  through T7 mild compression deformities. The T7 compression deformity is similar and the T5-6 compression deformities are new since 2022. Review of the MIP images confirms the above findings. IMPRESSION: 1.  No evidence of pulmonary embolism. 2. New superior segment right lower lobe lung mass, highly suspicious for primary bronchogenic carcinoma. Consider multidisciplinary thoracic oncology consultation for eventual PET and tissue sampling. 3. Tiny right pleural effusion with minimal loculation. 4. Relatively similar thoracic adenopathy which is most likely related to the history of leukemia/lymphoma. Marked upper abdominal adenopathy is suboptimally evaluated but grossly similar to 2022. 5. Aortic atherosclerosis (ICD10-I70.0), coronary artery atherosclerosis and emphysema (ICD10-J43.9). Pulmonary artery enlargement suggests pulmonary arterial hypertension. 6. Probable debris within the esophagus, suggesting dysmotility and/or gastroesophageal reflux. If there are clinical symptoms to suggest underlying esophageal mass, recommend endoscopy. These results will be called to the ordering clinician or representative by the Radiologist Assistant, and communication documented in the PACS or [employer]. Electronically Signed   By: Abigail Miyamoto M.D.   On: 09/30/2022 10:40   US Venous Img Lower Bilateral (DVT)  Result Date: 10/13/2022 CLINICAL DATA:  Swelling EXAM: BILATERAL LOWER EXTREMITY VENOUS DOPPLER ULTRASOUND TECHNIQUE: Gray-scale sonography with compression, as well as color and duplex ultrasound, were performed to evaluate the deep venous system(s) from the level of the common femoral vein through the popliteal and proximal calf veins. COMPARISON:  None Available. FINDINGS: VENOUS Normal compressibility of the common femoral, superficial femoral, and popliteal veins, as well as the visualized calf veins. Visualized portions of profunda femoral vein and great saphenous vein unremarkable. No filling defects  to suggest DVT on grayscale or color Doppler imaging. Doppler waveforms show normal direction of venous flow, normal respiratory plasticity and response to augmentation. Limited views of the contralateral common femoral vein are unremarkable. OTHER None. Limitations: none IMPRESSION: Negative. Electronically Signed   By: Rolm Baptise M.D.   On: 10/21/2022 21:06   DG Chest 2 View  Result Date: 10/23/2022 CLINICAL DATA:  Shortness of breath. EXAM: CHEST - 2 VIEW COMPARISON:  February 19, 2022 FINDINGS: Tortuosity and calcific atherosclerotic disease of the aorta. Cardiomediastinal silhouette is normal. Mediastinal contours appear intact. Streaky airspace opacities in the lower lobes. Fissural thickening bilaterally. No large pleural effusions are seen. Osseous structures are without acute abnormality. Soft tissues are grossly normal. IMPRESSION: Streaky airspace opacities in bilateral lower lobes may represent asymmetric pulmonary edema or atypical pneumonia. Bilateral fissural thickening. Electronically Signed   By: Fidela Salisbury M.D.   On: 10/13/2022 15:52     Assessment and Plan:   Acute on chronic hypoxic and hypercapnic respiratory failure/acute exacerbation of COPD/pneumonia -Admitted on 2/3 with acute on chronic hypoxic hypercapnic respiratory failure secondary to AECOPD, possible pneumonia and CHF exacerbation -Patient became agitated and continued to pull and BiPAP and had to be started on Precedex and was managed by PCCM -BiPAP and Precedex were weaned off and he is currently on 13 L nasal cannula of heated high flow -History of severe COPD and OSA noncompliant with trilogy ventilator at home -Elevated D-dimer with negative chest CTA for PE -CT of the chest unfortunately shows new right lower lung mass concerning for bronchogenic carcinoma-will need further follow-up and management -Recommend oncology consult during hospitalization for follow-up findings -He has BiPAP at night and as  needed -Continue to titrate FiO2  to maintain sats greater than equal to 92% -Continue with nebs -Continue antibiotic therapy -Continue with steroids -Supportive care  Acute on chronic diastolic congestive heart failure -Last echocardiogram revealed LVEF of 50-55% - -660 output in the last 24 hours -BNP 325.4 -Continued on furosemide 40 mg IVP daily patient was taking 40 mg p.o. twice daily at home as needed metolazone -Taking on Toprol-XL 25 mg daily -Continue on losartan 25 mg daily -Daily BMP while on diuretic therapy -Daily weights, I's and O's, low-sodium diet  Sepsis/cellulitis of bilateral lower extremity -Continued on antibiotic therapy -Management per IM  Acute metabolic encephalopathy due to hypercapnia and hypoxia -Slowly improving -Has been weaned off of Precedex -Maintaining oxygen saturation on high flow nasal cannula -Supportive care  Essential hypertension -Blood pressure 126/66 -Continued on current medication regimen -Vital signs per unit protocol  Hyperlipidemia -Continued on atorvastatin 40 mg daily  Type 2 diabetes -Currently continued on insulin -Management per IM  Paroxysmal atrial tachycardia -Has been sinus bradycardia to sinus on cardiac monitor -Continue Toprol-XL 25 mg daily as rate has been controlled on current beta-blocker dose -Continue with cardiac monitoring   Risk Assessment/Risk Scores:        New York Heart Association (NYHA) Functional Class NYHA Class III        For questions or updates, please contact [facility] Please consult [email or web address] for contact info under    Signed, Jayani Rozman, NP  10/01/2022 8:53 AM

## 2022-10-01 NOTE — Progress Notes (Signed)
NAME:  Julian Sparks, MRN:  [id number], DOB:  1945/10/22, LOS: 2 ADMISSION DATE:  10/03/2022, CONSULTATION DATE:  10/20/2022 REFERRING MD:  Sharion Settler NP  REASON FOR CONSULT: Acute on chronic respiratory failure   HPI  77 y.o male with significant PMH of stage III severe COPD on Trelegy vent, chronic respiratory failure with hypoxia and hypercapnia, RLS, OSA, chronic diastolic CHF, type II DM, and HTN, who presented to the ED with chief complaints of shortness of breath associated with productive cough.   ED Course: Initial vital signs showed HR of 101 beats/minute, BP 154/73 mm Hg, the RR 26 breaths/minute, and the oxygen saturation 92% on 4 L and a temperature of 98.52F (36.7C).   Pertinent Labs/Diagnostics Findings: Chemistry: Glucose 328, CO2 36, BUN 24 otherwise unremarkable CMP CBC: WBC: 15.1 Hgb/Hct: 10.9/37.4  Other Lab findings: COVID PCR: Negative, Troponin: 23 BNP: 260 Venous blood Gas result:  pO2 60; pCO2 92; pH 7.25;  HCO3 40.3, %O2 Sat 89.8.  Imaging:  CXR> streaky airspace opacity in bilateral lower lobes concerning for asymmetric pulmonary edema or atypical pneumonia Medications Administered in the ED: DuoNebs, nitroglycerin SL, doxycycline, ceftriaxone, Solu-Medrol 125 mg, Lasix 40 mg IV.  Patient continued to require increased oxygenation up to 6 L via nasal cannula with sats in the 90s.  He was placed on BiPAP and admitted to hospitalist service with suspected ischemic CHF and COPD exacerbation.  Past Medical History  stage III severe COPD on Trelegy vent, chronic respiratory failure with hypoxia and hypercapnia, RLS, OSA, chronic diastolic CHF, type II DM, and HTN  Significant Hospital Events   2/3: Admitted with acute on chronic hypoxic hypercapnic respiratory failure secondary to AECOPD, possible pneumonia and CHF exacerbation.  Patient became severely agitated and removing BiPAP started on Precedex.  PCCM consulted 2/4: Pt weaned off Bipap tolerating nasal  canula.  Currently weaning precedex gtt if able to wean off gtt PCCM team will sign off  2/5: Weaned off BiPAP and Precedex  Consults:  PCCM  Procedures:  None  Significant Diagnostic Tests:  2/3: Chest Xray> IMPRESSION: Streaky airspace opacities in bilateral lower lobes may represent asymmetric pulmonary edema or atypical pneumonia. Bilateral fissural thickening.  Interim History / Subjective:  -Agitation improved this am currently weaned off precedex gtt. -Net I&O -1083  Micro Data:  2/3: SARS-CoV-2 PCR> negative 2/3: Influenza PCR> negative 2/3: MRSA PCR>>positive  2/3: Strep pneumo urinary antigen>negative  2/3: Legionella urinary antigen>  Antimicrobials:  Doxycycline 2/3> Ceftriaxone 2/3>  OBJECTIVE  Blood pressure 122/61, pulse 82, temperature 99.8 F (37.7 C), temperature source Oral, resp. rate (!) 24, height 5\' 6"  (1.676 m), weight 97.8 kg, SpO2 93 %.    FiO2 (%):  [40 %-50 %] 50 %   Intake/Output Summary (Last 24 hours) at 10/01/2022 0433 Last data filed at 09/30/2022 2300 [place] per 24 hour  Intake 882.57 ml  Output 1700 ml  Net -817.43 ml    Filed Weights   10/15/2022 1511 09/27/2022 1741 10/01/22 0408  Weight: 93.1 kg 92.4 kg 97.8 kg   Physical Examination  GENERAL:: Acute on chronically ill appearing male, NAD on nasal canula HEENT: Head atraumatic, normocephalic. Supple, no JVD. Oropharynx and nasopharynx clear.  LUNGS: Diminished throughout, even, non labored   CARDIOVASCULAR: NSR, s1s2, no m/r/g, 2+ radial/1+ distal pulse, 1+ bilateral lower extremity edema  GI: +BS x4, soft, non tender, non distended  MUSCU: Moves all extremities  NEUROLOGIC: Alert and oriented, following commands, PERRLA  SKIN: Multiple bruising,  ecchymoses in bilateral upper and lower extremities, cellulitic changes to bilateral lower extremity  Labs/imaging that I havepersonally reviewed  (right click and "Reselect all SmartList Selections" daily)     Labs   CBC: Recent  Labs  Lab 09/28/2022 1519 09/30/22 0425  WBC 15.1* 9.9  NEUTROABS 8.3*  --   HGB 10.9* 10.0*  HCT 37.4* 33.5*  MCV 102.2* 100.3*  PLT 210 208     Basic Metabolic Panel: Recent Labs  Lab 10/07/2022 1519 09/30/22 0425 09/30/22 1213  NA 140 143 143  K 4.8 5.2* 5.0  CL 98 99 98  CO2 36* 37* 34*  GLUCOSE 328* 309* 121*  BUN 24* 30* 32*  CREATININE 0.88 0.89 0.82  CALCIUM 8.7* 8.7* 8.9  MG  --  2.0  --   PHOS  --  3.6  --     GFR: Estimated Creatinine Clearance: 81.3 mL/min (by C-G formula based on SCr of 0.82 mg/dL). Recent Labs  Lab 10/11/2022 1519 10/15/2022 1920 10/17/2022 1945 10/17/2022 2133 09/30/22 0425  PROCALCITON  --   --  0.12  --  0.11  WBC 15.1*  --   --   --  9.9  LATICACIDVEN  --  1.2  --  1.0  --      Liver Function Tests: Recent Labs  Lab 10/14/2022 1519  AST 17  ALT 21  ALKPHOS 105  BILITOT 0.7  PROT 7.2  ALBUMIN 3.8    No results for input(s): "LIPASE", "AMYLASE" in the last 168 hours. No results for input(s): "AMMONIA" in the last 168 hours.  ABG    Component Value Date/Time   PHART 7.4 10/03/2022 2230   PCO2ART 69 (HH) 10/21/2022 2230   PO2ART 96 10/23/2022 2230   HCO3 42.7 (H) 10/22/2022 2230   O2SAT 99.7 09/30/2022 2230     Coagulation Profile: No results for input(s): "INR", "PROTIME" in the last 168 hours.  Cardiac Enzymes: No results for input(s): "CKTOTAL", "CKMB", "CKMBINDEX", "TROPONINI" in the last 168 hours.  HbA1C: Hemoglobin A1C  Date/Time Value Ref Range Status  12/06/2021 10:54 AM 8.2 (A) 4.0 - 5.6 % Final   Hgb A1c MFr Bld  Date/Time Value Ref Range Status  10/15/2022 07:20 PM 10.1 (H) 4.8 - 5.6 % Final    Comment:    (NOTE) Pre diabetes:          5.7%-6.4%  Diabetes:              >6.4%  Glycemic control for   <7.0% adults with diabetes   04/14/2021 05:02 AM 6.8 (H) 4.8 - 5.6 % Final    Comment:    (NOTE) Pre diabetes:          5.7%-6.4%  Diabetes:              >6.4%  Glycemic control for    <7.0% adults with diabetes     CBG: Recent Labs  Lab 09/30/22 1118 09/30/22 1632 09/30/22 1929 09/30/22 2320 10/01/22 0322  GLUCAP 112* 166* 245* 142* 130*     Review of Systems:   UNABLE TO OBTAIN PATIENT IS ALTERED ON BIPAP  Past Medical History  He,  has a past medical history of CHF (congestive heart failure) (HCC), Chronic bronchitis ([place]), Chronic respiratory failure with hypoxia and hypercapnia ([place]), COPD (chronic obstructive pulmonary disease) ([place]), Diabetes mellitus without complication ([place]), Diastolic dysfunction, Dyspnea, and Hypertension.   Surgical History    Past Surgical History:  Procedure Laterality Date   CIRCUMCISION  SHOULDER ARTHROSCOPY WITH OPEN ROTATOR CUFF REPAIR Left 06/20/2016   Procedure: SHOULDER ARTHROSCOPY WITH OPEN ROTATOR CUFF REPAIR;  Surgeon: Earnestine Leys, MD;  Location: [facility] ORS;  Service: Orthopedics;  Laterality: Left;   UPPER GI ENDOSCOPY       Social History   reports that he quit smoking about 10 years ago. His smoking use included cigarettes. He has a 106.00 pack-year smoking history. He has quit using smokeless tobacco.  His smokeless tobacco use included chew. He reports that he does not drink alcohol and does not use drugs.   Family History   His family history includes Cancer in his sister; Diabetes in his brother; Hypertension in his mother.   Allergies Allergies  Allergen Reactions   Aspirin Nausea Only and Other (See Comments)    325 mg aspirin cause stomach upset   Penicillins Rash and Other (See Comments)     Home Medications  Prior to Admission medications   Medication Sig Start Date End Date Taking? Authorizing Provider  ACCU-CHEK AVIVA PLUS test strip TEST BLOOD SUGAR TWICE DAILY 12/25/21   [name], MD  Accu-Chek Softclix Lancets lancets CHECK BLOOD SUGAR TWO TIMES DAILY 01/01/22   [name], MD  albuterol (PROVENTIL) (2.5 MG/3ML) 0.083% nebulizer solution INHALE THE CONTENTS OF 1 VIAL VIA NEBULIZER  EVERY 6 HOURS AS NEEDED FOR SHORTNESS OF BREATH OR WHEEZING 09/03/22   Tyler Pita, MD  albuterol (VENTOLIN HFA) 108 (90 Base) MCG/ACT inhaler Inhale 2 puffs into the lungs every 6 (six) hours as needed for wheezing or shortness of breath. 12/06/21   [name], MD  Alcohol Swabs (DROPSAFE ALCOHOL PREP) 70 % PADS USE AS DIRECTED 03/09/22   [name], MD  aspirin 81 MG EC tablet Take 81 mg by mouth daily. Swallow whole.     [provider]  atorvastatin (LIPITOR) 40 MG tablet TAKE 1 TABLET EVERY DAY 03/13/22   [name], MD  B Complex-C (SUPER B COMPLEX PO) Take 1 tablet by mouth daily.     [provider]  Blood Glucose Monitoring Suppl (ACCU-CHEK AVIVA PLUS) w/Device KIT Check blood sugar BID 11/01/20   [name], MD  Budeson-Glycopyrrol-Formoterol (BREZTRI AEROSPHERE) 160-9-4.8 MCG/ACT AERO Inhale 2 puffs into the lungs in the morning and at bedtime. 12/05/21   Tyler Pita, MD  chlorpheniramine-HYDROcodone [facility] PENNKINETIC ER) 10-8 MG/5ML Take 5 mLs by mouth 2 (two) times daily. 03/07/22   [name], MD  doxycycline (VIBRA-TABS) 100 MG tablet Take 1 tablet (100 mg total) by mouth 2 (two) times daily. 09/03/22   Martyn Ehrich, NP  Dupilumab (DUPIXENT) 300 MG/2ML SOPN Inject 300 mg into the skin every 14 (fourteen) days. 09/14/22   Tyler Pita, MD  empagliflozin (JARDIANCE) 10 MG TABS tablet TAKE 1 TABLET(10 MG) BY MOUTH DAILY 07/10/22   Minna Merritts, MD  furosemide (LASIX) 20 MG tablet TAKE 2 TABLETS EVERY DAY 12/25/21   [name], MD  gabapentin (NEURONTIN) 300 MG capsule TAKE 1 CAPSULE THREE TIMES DAILY 06/18/22   [name], MD  gentamicin cream (GARAMYCIN) 0.1 % Apply 1 Application topically 2 (two) times daily. 04/10/22   Edrick Kins, DPM  glipiZIDE (GLUCOTROL XL) 5 MG 24 hr tablet TAKE 1 TABLET TWICE DAILY 04/09/22   [name], MD  losartan (COZAAR) 25 MG tablet Take 1 tablet (25 mg total) by mouth daily. 04/12/22    Alisa Graff, FNP  metFORMIN (GLUCOPHAGE) 500 MG tablet TAKE 1 TABLET TWICE DAILY 03/13/22  [name], MD  metolazone (ZAROXOLYN) 5 MG tablet For weight 201 or higher take metolazone 5 mg, then 30 minutes later take lasix 40 mg 06/07/21   Minna Merritts, MD  metoprolol succinate (TOPROL-XL) 25 MG 24 hr tablet TAKE 1 TABLET(25 MG) BY MOUTH DAILY 11/13/21   [name], MD  predniSONE (DELTASONE) 10 MG tablet 4 tabs daily for 3 days, then 3 tabs daily for 3 days, 2 tabs daily for 3 days, then 1 tab daily for 3 days, then stop 09/03/22   Martyn Ehrich, NP  predniSONE (DELTASONE) 5 MG tablet TAKE 2 [place] Patient taking differently: Take 5 mg by mouth daily with breakfast. Weaning. Now taking 1 tablet daily 01/04/22   [name], MD  Pseudoeph-Doxylamine-DM-APAP (NYQUIL PO) Take by mouth as needed.    [provider]  rOPINIRole (REQUIP) 1 MG tablet TAKE 1 TABLET(1 MG) BY MOUTH AT BEDTIME 09/06/22   Tyler Pita, MD  traZODone (DESYREL) 50 MG tablet TAKE 2 TABLETS EVERY DAY 06/07/22   [name], MD    Scheduled Meds:  aspirin EC  81 mg Oral Daily   atorvastatin  40 mg Oral Daily   budesonide (PULMICORT) nebulizer solution  0.25 mg Nebulization BID   Chlorhexidine Gluconate Cloth  6 each Topical Q0600   docusate sodium  100 mg Oral BID   enoxaparin (LOVENOX) injection  40 mg Subcutaneous Q24H   furosemide  40 mg Intravenous Daily   gabapentin  300 mg Oral TID   insulin aspart  0-20 Units Subcutaneous Q4H   insulin glargine-yfgn  10 Units Subcutaneous Daily   ipratropium-albuterol  3 mL Nebulization QID   losartan  25 mg Oral Daily   methylPREDNISolone (SOLU-MEDROL) injection  40 mg Intravenous Q12H   metoprolol succinate  25 mg Oral Daily    morphine injection  2 mg Intravenous Once   mouth rinse  15 mL Mouth Rinse 4 times per day   rOPINIRole  1 mg Oral QHS   Continuous Infusions:  cefTRIAXone (ROCEPHIN)  IV Stopped (09/30/22  1638)   dexmedetomidine (PRECEDEX) IV infusion 0.3 mcg/kg/hr (09/30/22 2300)   doxycycline (VIBRAMYCIN) IV 100 mg (10/01/22 0412)   PRN Meds:.acetaminophen **OR** acetaminophen, albuterol, LORazepam, metolazone, morphine injection, nitroGLYCERIN, ondansetron **OR** ondansetron (ZOFRAN) IV, mouth rinse, polyethylene glycol, traZODone   Active Hospital Problem list     Assessment & Plan:  #Acute on Chronic Hypoxic and Hypercapnic Respiratory Failure #Acute Exacerbation of COPD #Pneumonia #CHF exacerbation Hx: Severe COPD and OSA non compliant with Trilogy vent at home. CTA Chest results negative for PE, unfortunately shows new right lower lobe lung mass concerning for Bronchogenic Carcinoma - Supplemental O2 prn to maintain O2 saturations 88 to 92% - Bipap prn and qhs  - Follow intermittent Chest X-ray & ABG as needed - Bronchodilators and Pulmicort nebs - IV Solu-Medrol 40 mg bid  - Antibiotics as below - Needs Oncology Consult for eventual PET scan and Biopsy of lung mass   #Sepsis  # Cellulitis of bilateral lower extremity Met SIRS criteria on admission: Heart Rate 102 beats/minute, Respiratory Rate 26 breaths/minute, AMS -Trend WBC and monitor fever curve  -Trend PCT  - Continue IV antibiotics ceftriaxone and doxycycline  #Acute on chronic Diastolic HFpEF  #PAF Hx: HLD and HTN  - Continuous telemetry monitoring  - Consider holding outpatient antihypertensives due to soft bp's  - Diuresis as BP and renal function permits ~ Continue lasix daily   - Repeat 2D  Echocardiogram  with no significant changes from prior LVEF 50-55%  #Hyperkalemia ~RESOLVED - Trend BMP  - Replace electrolytes as indicated  - Monitor I&O's   #Acute Metabolic Encephalopathy due to Hypercapnia~improving   - Correct metabolic derangements - Off precedex gtt as tolerated  - Provide supportive care   # T2DM - CBG q4h  - Continue resistant SSI and semglee  - Follow ICU hyper/hypoglycemia  protocol - Hold home Glipizide  #RLS - Continue Requip  Best practice:  Diet: Carb modified  Pain/Anxiety/Delirium protocol (if indicated): Precedex gtt attempting to wean off  VAP protocol (if indicated): Not indicated DVT prophylaxis: LMWH GI prophylaxis: N/A  Glucose control:  SSI Yes Central venous access:  N/A Arterial line:  N/A Foley:  N/A Mobility: Activity as tolerated  PT consulted: N/A Last date of multidisciplinary goals of care discussion [09/30/22] Code Status:  full code Disposition: ICU  Critical care time: 32 minutes      Rufina Falco, DNP, CCRN, FNP-C, AGACNP-BC Acute Care & Family Nurse Practitioner  [place] for personal pager PCCM on call pager [phone] until 7 am

## 2022-10-02 DIAGNOSIS — I5033 Acute on chronic diastolic (congestive) heart failure: Secondary | ICD-10-CM | POA: Diagnosis not present

## 2022-10-02 DIAGNOSIS — I509 Heart failure, unspecified: Secondary | ICD-10-CM | POA: Diagnosis not present

## 2022-10-02 DIAGNOSIS — E669 Obesity, unspecified: Secondary | ICD-10-CM

## 2022-10-02 DIAGNOSIS — G4733 Obstructive sleep apnea (adult) (pediatric): Secondary | ICD-10-CM

## 2022-10-02 DIAGNOSIS — J441 Chronic obstructive pulmonary disease with (acute) exacerbation: Secondary | ICD-10-CM | POA: Diagnosis not present

## 2022-10-02 DIAGNOSIS — L03116 Cellulitis of left lower limb: Secondary | ICD-10-CM

## 2022-10-02 DIAGNOSIS — Z515 Encounter for palliative care: Secondary | ICD-10-CM

## 2022-10-02 DIAGNOSIS — J9601 Acute respiratory failure with hypoxia: Secondary | ICD-10-CM | POA: Diagnosis not present

## 2022-10-02 LAB — GLUCOSE, CAPILLARY
Glucose-Capillary: 148 mg/dL — ABNORMAL HIGH (ref 70–99)
Glucose-Capillary: 118 mg/dL — ABNORMAL HIGH (ref 70–99)
Glucose-Capillary: 145 mg/dL — ABNORMAL HIGH (ref 70–99)
Glucose-Capillary: 195 mg/dL — ABNORMAL HIGH (ref 70–99)
Glucose-Capillary: 295 mg/dL — ABNORMAL HIGH (ref 70–99)
Glucose-Capillary: 228 mg/dL — ABNORMAL HIGH (ref 70–99)

## 2022-10-02 LAB — BASIC METABOLIC PANEL
Anion gap: 8 (ref 5–15)
BUN: 39 mg/dL — ABNORMAL HIGH (ref 8–23)
CO2: 35 mmol/L — ABNORMAL HIGH (ref 22–32)
Calcium: 8 mg/dL — ABNORMAL LOW (ref 8.9–10.3)
Chloride: 94 mmol/L — ABNORMAL LOW (ref 98–111)
Creatinine, Ser: 0.75 mg/dL (ref 0.61–1.24)
GFR, Estimated: >60 mL/min (ref >60)
Glucose, Bld: 135 mg/dL — ABNORMAL HIGH (ref 70–99)
Potassium: 4.6 mmol/L (ref 3.5–5.1)
Sodium: 137 mmol/L (ref 135–145)

## 2022-10-02 LAB — CBC
HCT: 28.6 % — ABNORMAL LOW (ref 39.0–52.0)
Hemoglobin: 8.5 g/dL — ABNORMAL LOW (ref 13.0–17.0)
MCH: 29.6 pg (ref 26.0–34.0)
MCHC: 29.7 g/dL — ABNORMAL LOW (ref 30.0–36.0)
MCV: 99.7 fL (ref 80.0–100.0)
Platelets: 204 10*3/uL (ref 150–400)
RBC: 2.87 MIL/uL — ABNORMAL LOW (ref 4.22–5.81)
RDW: 14.7 % (ref 11.5–15.5)
WBC: 15.1 10*3/uL — ABNORMAL HIGH (ref 4.0–10.5)
nRBC: 0 % (ref 0.0–0.2)

## 2022-10-02 MED ORDER — SODIUM CHLORIDE 0.9 % IV SOLN: INTRAVENOUS | Status: DC

## 2022-10-02 MED ORDER — SODIUM CHLORIDE 0.9 % IV BOLUS
500.0000 mL | Freq: Once | INTRAVENOUS | Status: AC
  Administered 2022-10-02: 500 mL via INTRAVENOUS

## 2022-10-02 MED ORDER — CHLORHEXIDINE GLUCONATE CLOTH 2 % EX PADS
6.0000 | MEDICATED_PAD | Freq: Every day | CUTANEOUS | Status: DC
  Administered 2022-10-02 – 2022-10-04 (×2): 6 via TOPICAL

## 2022-10-02 MED ORDER — HALOPERIDOL LACTATE 5 MG/ML IJ SOLN
5.0000 mg | Freq: Once | INTRAMUSCULAR | Status: AC
  Administered 2022-10-02: 5 mg via INTRAVENOUS
  Filled 2022-10-02: qty 1

## 2022-10-02 MED ORDER — DEXMEDETOMIDINE HCL IN NACL 400 MCG/100ML IV SOLN
0.0000 ug/kg/h | INTRAVENOUS | Status: DC
  Administered 2022-10-02: 0.4 ug/kg/h via INTRAVENOUS
  Administered 2022-10-02: 0.8 ug/kg/h via INTRAVENOUS
  Administered 2022-10-03: 0.5 ug/kg/h via INTRAVENOUS
  Filled 2022-10-02 (×4): qty 100

## 2022-10-02 MED ORDER — MUPIROCIN 2 % EX OINT
1.0000 | TOPICAL_OINTMENT | Freq: Two times a day (BID) | CUTANEOUS | Status: DC
  Administered 2022-10-02 – 2022-10-05 (×7): 1 via NASAL
  Filled 2022-10-02: qty 22

## 2022-10-02 NOTE — IPAL (Signed)
  Interdisciplinary Goals of Care Family Meeting   Date carried out: 10/02/2022  Location of the meeting: Bedside  Member's involved: Physician and Family Member or next of kin     GOALS OF CARE DISCUSSION  The Clinical status was relayed to family in detail- Daughter at bedside  Updated and notified of patients medical condition- Patient remains unresponsive and will not open eyes to command.   Patient is having a weak cough and struggling to remove secretions.   Patient with increased WOB and using accessory muscles to breathe Explained to family course of therapy and the modalities   Patient with Progressive multiorgan failure with a very high probablity of a very minimal chance of meaningful recovery despite all aggressive and optimal medical therapy.  PATIENT REMAINS DNR status  Family understands the situation. Severe end stage COPD Has heart failure Diagnosis of pneumonia Diagnosis of left lung mass Large ABD mass/adenopathy Patient on biPAP unresponsive-suffocating  Patient would NOT Want CPR, maybe place on vent  patient would NOT want biopsies or CHEMO therapy.   Recommend Palliative care consultation and assess for comfort care measures  Family are satisfied with Plan of action and management. All questions answered  Additional CC time 35 mins   Jahnaya Branscome Patricia Pesa, M.D.  [facility]  Medical Director [place] Director [facility] Cardio-Pulmonary Department

## 2022-10-02 NOTE — Consult Note (Signed)
Consultation Note Date: 10/02/2022 at 1400  Patient Name: Julian Sparks  DOB: 10/30/1945  MRN: [id number]  Age / Sex: 77 y.o., male  PCP: [name], MD Referring Physician: Lucillie Garfinkel, MD  Reason for Consultation: Establishing goals of care  HPI/Patient Profile: 77 y.o. male  with past medical history of stage III severe COPD on Trelegy vent, chronic respiratory failure with hypoxia and hypercapnia, RLS, OSA, chronic diastolic CHF, type II DM, and HTN  admitted on 10/02/2022 with SOB.  Pt is being treated for AECOPD, possible PNA, and CHF exacerbation. Pt has been agitated and Precedex gtt started. Pt has been on/off bipap with severe SOB    Clinical Assessment and Goals of Care: I have reviewed medical records including EPIC notes, labs and imaging, assessed the patient and then met with patient and granddaughter-in-law Vladimir Creeks?) at bedside to discuss diagnosis prognosis, [place], EOL wishes, disposition and options.   Her mother in law is patient's daughter [name]. [name] has stepped away to get lunch.   During my visit, pt had a large BM so granddaughter and I stepped outside of room to talk.   I introduced Palliative Medicine as specialized medical care for people living with serious illness. It focuses on providing relief from the symptoms and stress of a serious illness. The goal is to improve quality of life for both the patient and the family.  We discussed patient's current illness and what it means in the larger context of patient's on-going co-morbidities. Bipap, COPD, and respiratory distress discussed.     I attempted to elicit values and goals of care important to the patient. She shares that her MIL [name] is patient's Company secretary. She knows the patient is tired and agitated with being in the hospital. She shares he seems more calm and not as agitated as he once was.     Family is facing  treatment option decisions, advanced directive, and anticipatory care needs. I offered to meet with [name] and other family members at their convenience. PMT contact information given.    Discussed with granddaughter the importance of continued conversation with family and the medical providers regarding overall plan of care and treatment options, ensuring decisions are within the context of the patient's values and GOCs.    Questions and concerns were addressed. PMT will continue to follow.   Later in the afternoon, I attempted to speak with [name] over the phone. Her daughter [name] answered and confirmed that family can meet bedside tomorrow morning at 10am.   [place] meeting scheduled for 2/7 at 10am  Primary Decision Maker NEXT OF KIN  Physical Exam Vitals reviewed.  Constitutional:      General: He is not in acute distress.    Appearance: He is obese.  HENT:     Head: Normocephalic.  Cardiovascular:     Rate and Rhythm: Normal rate.  Pulmonary:     Comments: bipap Musculoskeletal:     Comments: Generalized weakness  Psychiatric:        Mood  and Affect: Mood is not anxious.        Behavior: Behavior is not agitated.     Palliative Assessment/Data:     Thank you for this consult. Palliative medicine will continue to follow and assist holistically.   Time Total: 75 minutes Greater than 50%  of this time was spent counseling and coordinating care related to the above assessment and plan.  Signed by: Jordan Hawks, DNP, FNP-BC Palliative Medicine    Please contact Palliative Medicine Team phone at [phone] for questions and concerns.  For individual provider: See Shea Tidd

## 2022-10-02 NOTE — Progress Notes (Signed)
       CROSS COVER NOTE  NAME: Julian Sparks MRN: [id number] DOB : 10-18-1945 ATTENDING PHYSICIAN: Lucillie Garfinkel, MD    Date of Service   10/02/2022   HPI/Events of Note   Message received from RN reporting that Mr. Steeves is attempting to ambulate and removing equipment.  At bedside his legs are swelling over the side rails and he is pulling at telemetry wires and IVs.  He has multiple abrasions to his legs  RN has already given as needed Ativan, IV Haldol ordered.  Symptoms refractory to both.  Will restart Precedex.  Interventions   Assessment/Plan: 5 mg IV Haldol Restart Precedex       To reach the provider On-Call:   7AM- 7PM see care teams to locate the attending and reach out to them via [email or web address] Password: [email or web address] 7PM-7AM contact night-coverage If you still have difficulty reaching the appropriate provider, please page the [facility] (Director on Call) for [facility] on amion for assistance  This document was prepared using Systems analyst and may include unintentional dictation errors.  Neomia Glass DNP, MBA, FNP-BC, PMHNP-BC Nurse Practitioner [facility] Pager [phone]

## 2022-10-02 NOTE — Progress Notes (Signed)
Notified Dr. Hollice Gong around 1435 that patient has had 2 large maroon stools with some clots, BP slightly lower 84/52 with heart rate 60-70's. MD acknowledged and ordered IVFs, bolus and instructed RN to hold Lovenox today.

## 2022-10-02 NOTE — Progress Notes (Signed)
PROGRESS NOTE  Julian Sparks YOV:[id number] DOB: Jun 17, 1946 DOA: 10/18/2022 PCP: [name], MD  Hospital Course/Subjective: Julian Sparks is a 77 y.o. male with medical history significant for diastolic congestive heart failure, COPD chronically on 2 to 4 L nasal cannula oxygen, non-insulin-dependent type 2 diabetes, insomnia, obstructive sleep apnea, restless leg syndrome who was admitted to the stepdown unit on the hospitalist service with acute hypoxic and hypercapnic respiratory failure likely due to combination of exacerbation of COPD as well as acute on chronic diastolic congestive heart failure.  He developed severe agitation soon after admission to the critical care unit, was seen by pulmonary critical care due to high risk of intubation.  Patient was seen and examined this morning, has been on Bipap overnight and had to be placed back on Precedex currently at 0.8. Close friend at the bedside and updated.  Had CT abd/pelvis yesterday due to belly pain; which is better now after BM last night.  Assessment/Plan: Sepsis-patient is meeting criteria at the time of admission with tachycardia, leukocytosis, source is likely combination of COPD exacerbation (with possible superimposed bacterial pneumonia), as well as cellulitis of the legs which is improving.  Endorgan dysfunction with metabolic encephalopathy due to severe COPD exacerbation.  Patient is hemodynamically stable and generally improved but we are struggling with agitation.  Acute hypoxic and hypercarbic respiratory failure (HCC) -this likely due to combination of acute exacerbation of COPD, as well as acute on chronic diastolic congestive heart failure.  Elevated D-dimer on admission. -Continue BiPAP support at night, PCCM management much appreciated, they signed off  Obesity (BMI 74.1-28.7) -complicates all aspects of care  Troponin leak-most likely due to stress of sepsis, patient without chest pain; will trend  Diabetes  mellitus type 1.5 uncontrolled with hyperglycemia  -diabetic diet when eating, hold metformin and glipizide, weight-based sliding scale insulin in the meantime, along with daily Lantus 10 units  Acute on chronic respiratory failure with hypoxia (HCC)  Acute on chronic diastolic CHF (congestive heart failure) (HCC) -heart healthy diabetic diet, continue cautious daily Lasix diuresis, follow electrolytes closely.  Continue home cardiac medications, Echo as below. Cardiology following, agree with gentle diuresis.  Abdominal LAD 2/2 CLL - this was previously biopsied with Dr. Randa Evens in 2020. Now has increased size of LAD as well as new L adrenal enlargement. Recommend outpatient FU with Onc, and repeat PET scan. Discussed with daughter [name] today 2/6.  Paroxysmal atrial tachycardia -currently stable, continue home beta-blocker  OSA (obstructive sleep apnea) -maintain on BiPAP whenever sleeping  COPD with acute exacerbation ([place]) -with increased dyspnea, even at rest, slowly improving.  Will treat with IV steroids, scheduled and as needed breathing treatments.   Due to severity of his exacerbation, will treat empirically with IV doxycycline and Rocephin.  Cellulitis of left leg -mild, continue empiric IV Rocephin and doxycycline, it is improving significantly   DVT prophylaxis: Lovenox   Code Status: Patient is DNR, but would be amenable to intubation in case of respiratory decline.   Family Communication: Discussed with his daughter [name] in the ICU multiple times since admission. Also updated on the phone today 2/6. She asks that no other visitors/family members be given specific medical information other than to [facility].   Disposition Plan: Inpatient admission stepdown status in the ICU.   Consults: PCCM (signed off), Cardiology  Antimicrobials: Anti-infectives (From admission, onward)    Start     Dose/Rate Route Frequency Ordered Stop   09/30/22 1530  cefTRIAXone (ROCEPHIN) 1  g in  sodium chloride 0.9 % 100 mL IVPB        1 g 200 mL/hr over 30 Minutes Intravenous Every 24 hours 09/27/2022 1652     09/30/22 0500  doxycycline (VIBRAMYCIN) 100 mg in sodium chloride 0.9 % 250 mL IVPB        100 mg 125 mL/hr over 120 Minutes Intravenous Every 12 hours 10/04/2022 1652     10/21/2022 1545  doxycycline (VIBRAMYCIN) 100 mg in sodium chloride 0.9 % 250 mL IVPB        100 mg 125 mL/hr over 120 Minutes Intravenous  Once 10/07/2022 1527 10/24/2022 2004   10/19/2022 1530  cefTRIAXone (ROCEPHIN) 1 g in sodium chloride 0.9 % 100 mL IVPB        1 g 200 mL/hr over 30 Minutes Intravenous  Once 10/21/2022 1527 10/21/2022 1613      Objective: Vitals:   10/02/22 0600 10/02/22 0700 10/02/22 0746 10/02/22 0800  BP: (!) 98/59 101/62  95/60  Pulse: 73 66 (!) 59 65  Resp: 20 19 (!) 21 19  Temp:    97.8 F (36.6 C)  TempSrc:    Axillary  SpO2: 97% 95% 94% 94%  Weight:      Height:        Intake/Output Summary (Last 24 hours) at 10/02/2022 0905 Last data filed at 10/02/2022 0800 Gross per 24 hour  Intake 1126.12 ml  Output 2500 ml  Net -1373.88 ml    Filed Weights   10/19/2022 1741 10/01/22 0408 10/02/22 0354  Weight: 92.4 kg 97.8 kg 90.8 kg   Exam: General:  Alert, oriented to self only, sleepy; on Bipap. Seen with family member York Cerise and RN at the bedside. Eyes: EOMI, clear conjuctivae, white sclerea Neck: supple, no masses, trachea mildline  Cardiovascular: RRR, no murmurs or rubs, no peripheral edema  Respiratory: clear to auscultation bilaterally, no wheezes, no crackles  Abdomen: soft, nontender, nondistended, normal bowel tones heard  Skin: dry, no rashes  Musculoskeletal: no joint effusions, normal range of motion  Psychiatric: appropriate affect, normal speech  Neurologic: extraocular muscles intact, clear speech, moving all extremities with intact sensorium  Data Reviewed: CBC: Recent Labs  Lab 09/28/2022 1519 09/30/22 0425 10/01/22 0412 10/02/22 0532  WBC 15.1* 9.9 14.3*  15.1*  NEUTROABS 8.3*  --   --   --   HGB 10.9* 10.0* 9.8* 8.5*  HCT 37.4* 33.5* 32.3* 28.6*  MCV 102.2* 100.3* 100.0 99.7  PLT 210 208 215 833    Basic Metabolic Panel: Recent Labs  Lab 10/16/2022 1519 09/30/22 0425 09/30/22 1213 10/01/22 0412 10/02/22 0532  NA 140 143 143 141 137  K 4.8 5.2* 5.0 4.6 4.6  CL 98 99 98 96* 94*  CO2 36* 37* 34* 34* 35*  GLUCOSE 328* 309* 121* 140* 135*  BUN 24* 30* 32* 40* 39*  CREATININE 0.88 0.89 0.82 0.81 0.75  CALCIUM 8.7* 8.7* 8.9 8.8* 8.0*  MG  --  2.0  --   --   --   PHOS  --  3.6  --   --   --     GFR: Estimated Creatinine Clearance: 80.3 mL/min (by C-G formula based on SCr of 0.75 mg/dL). Liver Function Tests: Recent Labs  Lab 10/12/2022 1519  AST 17  ALT 21  ALKPHOS 105  BILITOT 0.7  PROT 7.2  ALBUMIN 3.8    No results for input(s): "LIPASE", "AMYLASE" in the last 168 hours. No results for input(s): "AMMONIA" in the  last 168 hours. Coagulation Profile: No results for input(s): "INR", "PROTIME" in the last 168 hours. Cardiac Enzymes: No results for input(s): "CKTOTAL", "CKMB", "CKMBINDEX", "TROPONINI" in the last 168 hours. BNP (last 3 results) No results for input(s): "PROBNP" in the last 8760 hours. HbA1C: Recent Labs    10/12/2022 1920  HGBA1C 10.1*    CBG: Recent Labs  Lab 10/01/22 1600 10/01/22 1946 10/01/22 2335 10/02/22 0318 10/02/22 0736  GLUCAP 198* 265* 144* 148* 118*    Lipid Profile: No results for input(s): "CHOL", "HDL", "LDLCALC", "TRIG", "CHOLHDL", "LDLDIRECT" in the last 72 hours. Thyroid Function Tests: No results for input(s): "TSH", "T4TOTAL", "FREET4", "T3FREE", "THYROIDAB" in the last 72 hours. Anemia Panel: No results for input(s): "VITAMINB12", "FOLATE", "FERRITIN", "TIBC", "IRON", "RETICCTPCT" in the last 72 hours. Urine analysis:    Component Value Date/Time   COLORURINE YELLOW 02/11/2012 [place] 02/11/2012 1608   LABSPEC 1.010 02/11/2012 1608   PHURINE 6.5  02/11/2012 1608   GLUCOSEU NEGATIVE 02/11/2012 1608   HGBUR 2+ 02/11/2012 1608   BILIRUBINUR NEGATIVE 02/11/2012 1608   KETONESUR NEGATIVE 02/11/2012 1608   PROTEINUR NEGATIVE 02/11/2012 1608   NITRITE NEGATIVE 02/11/2012 1608   LEUKOCYTESUR NEGATIVE 02/11/2012 1608   Sepsis Labs: @LABRCNTIP (procalcitonin:4,lacticidven:4)  ) Recent Results (from the past 240 hour(s))  Resp panel by RT-PCR (RSV, Flu A&B, Covid)     Status: None   Collection Time: 10/21/2022  3:42 PM  Result Value Ref Range Status   SARS Coronavirus 2 by RT PCR NEGATIVE NEGATIVE Final    Comment: (NOTE) SARS-CoV-2 target nucleic acids are NOT DETECTED.  The SARS-CoV-2 RNA is generally detectable in upper respiratory specimens during the acute phase of infection. The lowest concentration of SARS-CoV-2 viral copies this assay can detect is 138 copies/mL. A negative result does not preclude SARS-Cov-2 infection and should not be used as the sole basis for treatment or other patient management decisions. A negative result may occur with  improper specimen collection/handling, submission of specimen other than nasopharyngeal swab, presence of viral mutation(s) within the areas targeted by this assay, and inadequate number of viral copies(<138 copies/mL). A negative result must be combined with clinical observations, patient history, and epidemiological information. The expected result is Negative.  Fact Sheet for Patients:  EntrepreneurPulse.com.au  Fact Sheet for Healthcare Providers:  IncredibleEmployment.be  This test is no t yet approved or cleared by the [place] FDA and  has been authorized for detection and/or diagnosis of SARS-CoV-2 by FDA under an Emergency Use Authorization (EUA). This EUA will remain  in effect (meaning this test can be used) for the duration of the COVID-19 declaration under Section 564(b)(1) of the Act, 21 U.S.C.section 360bbb-3(b)(1), unless  the authorization is terminated  or revoked sooner.       Influenza A by PCR NEGATIVE NEGATIVE Final   Influenza B by PCR NEGATIVE NEGATIVE Final    Comment: (NOTE) The Xpert Xpress SARS-CoV-2/FLU/RSV plus assay is intended as an aid in the diagnosis of influenza from Nasopharyngeal swab specimens and should not be used as a sole basis for treatment. Nasal washings and aspirates are unacceptable for Xpert Xpress SARS-CoV-2/FLU/RSV testing.  Fact Sheet for Patients: EntrepreneurPulse.com.au  Fact Sheet for Healthcare Providers: IncredibleEmployment.be  This test is not yet approved or cleared by the [place] FDA and has been authorized for detection and/or diagnosis of SARS-CoV-2 by FDA under an Emergency Use Authorization (EUA). This EUA will remain in effect (meaning this test can be used) for  the duration of the COVID-19 declaration under Section 564(b)(1) of the Act, 21 U.S.C. section 360bbb-3(b)(1), unless the authorization is terminated or revoked.     Resp Syncytial Virus by PCR NEGATIVE NEGATIVE Final    Comment: (NOTE) Fact Sheet for Patients: EntrepreneurPulse.com.au  Fact Sheet for Healthcare Providers: IncredibleEmployment.be  This test is not yet approved or cleared by the [place] FDA and has been authorized for detection and/or diagnosis of SARS-CoV-2 by FDA under an Emergency Use Authorization (EUA). This EUA will remain in effect (meaning this test can be used) for the duration of the COVID-19 declaration under Section 564(b)(1) of the Act, 21 U.S.C. section 360bbb-3(b)(1), unless the authorization is terminated or revoked.  Performed at [facility], [address] [place], [place] [address]   MRSA Next Gen by PCR, Nasal     Status: Abnormal   Collection Time: 10/17/2022  5:48 PM   Specimen: Nasal Mucosa; Nasal Swab  Result Value Ref Range Status   MRSA by PCR  Next Gen DETECTED (A) NOT DETECTED Final    Comment: CRITICAL RESULT CALLED TO, READ BACK BY AND VERIFIED WITH: Laurin Coder RN @ 2221 10/07/2022 LFD (NOTE) The GeneXpert MRSA Assay (FDA approved for NASAL specimens only), is one component of a comprehensive MRSA colonization surveillance program. It is not intended to diagnose MRSA infection nor to guide or monitor treatment for MRSA infections. Test performance is not FDA approved in patients less than 46 years old. Performed at [facility], [address], [place], [place] [id number]      Studies: CT ABDOMEN PELVIS W CONTRAST  Result Date: 10/01/2022 CLINICAL DATA:  Lung nodule. * Tracking Code: BO * EXAM: CT ABDOMEN AND PELVIS WITH CONTRAST TECHNIQUE: Multidetector CT imaging of the abdomen and pelvis was performed using the standard protocol following bolus administration of intravenous contrast. RADIATION DOSE REDUCTION: This exam was performed according to the departmental dose-optimization program which includes automated exposure control, adjustment of the mA and/or kV according to patient size and/or use of iterative reconstruction technique. CONTRAST:  162mL OMNIPAQUE IOHEXOL 300 MG/ML  SOLN COMPARISON:  CT chest 1 day prior, CT abdomen 02/03/2021 FINDINGS: Lower chest: Lung bases are clear. Hepatobiliary: No focal hepatic lesion.  Gallbladder normal. Pancreas: Pancreas is normal. No ductal dilatation. No pancreatic inflammation. Spleen: Normal spleen Adrenals/urinary tract: LEFT adrenal gland is enlarged 16 mm (image 21/series 2). Enlargement new from CT 02/03/2021. Kidneys, ureters and bladder normal. Stomach/Bowel: Stomach, small bowel, appendix, and cecum are normal. Multiple diverticula of the descending colon and sigmoid colon without acute inflammation. Vascular/Lymphatic: Bulky periaortic adenopathy. Lymphoid mass measures 14.5 by 8.7 cm (image 40/series 2) compared to 14.5 by 7.0 cm visually the mass appears slightly larger.  Periportal lymph node measures 4.0 cm short axis compared to 3.6 cm. Single nodal mass ventral to the aorta measures 5.5 cm compared to 4.7 cm. No pelvic lymphadenopathy. Reproductive: Prostate normal Other: No free fluid. Musculoskeletal: No aggressive osseous lesion. IMPRESSION: 1. New enlargement of the LEFT adrenal gland concerning for metastasis. 2. Bulky retroperitoneal periaortic lymphoid mass slightly increased in volume compared to CT 02/03/2021. 3. Bibasilar atelectasis and pleural effusions. Electronically Signed   By: Suzy Bouchard M.D.   On: 10/01/2022 16:56    Scheduled Meds:  aspirin EC  81 mg Oral Daily   atorvastatin  40 mg Oral Daily   budesonide (PULMICORT) nebulizer solution  0.25 mg Nebulization BID   Chlorhexidine Gluconate Cloth  6 each Topical Q0600  docusate sodium  100 mg Oral BID   enoxaparin (LOVENOX) injection  0.5 mg/kg Subcutaneous Q24H   furosemide  40 mg Intravenous Daily   gabapentin  300 mg Oral TID   insulin aspart  0-20 Units Subcutaneous Q4H   insulin glargine-yfgn  10 Units Subcutaneous Daily   ipratropium-albuterol  3 mL Nebulization TID   losartan  25 mg Oral Daily   methylPREDNISolone (SOLU-MEDROL) injection  40 mg Intravenous Q12H   metoprolol succinate  25 mg Oral Daily    morphine injection  2 mg Intravenous Once   mouth rinse  15 mL Mouth Rinse 4 times per day   rOPINIRole  1 mg Oral QHS   senna-docusate  1 tablet Oral BID   Continuous Infusions:  cefTRIAXone (ROCEPHIN)  IV Stopped (10/01/22 1718)   dexmedetomidine (PRECEDEX) IV infusion 0.8 mcg/kg/hr (10/02/22 0835)   doxycycline (VIBRAMYCIN) IV Stopped (10/02/22 3953)    LOS: 3 days   Time spent: 31 minutes  Abrianna Sidman Neva Seat, MD [facility] Pager [phone]  If 7PM-7AM, please contact night-coverage [email or web address] Password [email or web address] 10/02/2022, 9:05 AM

## 2022-10-02 NOTE — Progress Notes (Signed)
Rounding Note    Patient Name: Julian Sparks Date of Encounter: 10/02/2022  [place] Cardiologist: Ida Rogue, MD   Subjective   Patient seen on AM rounds. Denies any chest pain or shortness of breath. Restarted on Precedex drip due to being combative and agitated  Inpatient Medications    Scheduled Meds:  aspirin EC  81 mg Oral Daily   atorvastatin  40 mg Oral Daily   budesonide (PULMICORT) nebulizer solution  0.25 mg Nebulization BID   Chlorhexidine Gluconate Cloth  6 each Topical Q0600   docusate sodium  100 mg Oral BID   enoxaparin (LOVENOX) injection  0.5 mg/kg Subcutaneous Q24H   furosemide  40 mg Intravenous Daily   gabapentin  300 mg Oral TID   insulin aspart  0-20 Units Subcutaneous Q4H   insulin glargine-yfgn  10 Units Subcutaneous Daily   ipratropium-albuterol  3 mL Nebulization TID   losartan  25 mg Oral Daily   methylPREDNISolone (SOLU-MEDROL) injection  40 mg Intravenous Q12H   metoprolol succinate  25 mg Oral Daily    morphine injection  2 mg Intravenous Once   mouth rinse  15 mL Mouth Rinse 4 times per day   rOPINIRole  1 mg Oral QHS   senna-docusate  1 tablet Oral BID   Continuous Infusions:  cefTRIAXone (ROCEPHIN)  IV Stopped (10/01/22 1718)   dexmedetomidine (PRECEDEX) IV infusion 0.8 mcg/kg/hr (10/02/22 0835)   doxycycline (VIBRAMYCIN) IV Stopped (10/02/22 9833)   PRN Meds: acetaminophen **OR** acetaminophen, albuterol, LORazepam, metolazone, morphine injection, nitroGLYCERIN, ondansetron **OR** ondansetron (ZOFRAN) IV, mouth rinse, polyethylene glycol, traZODone   Vital Signs    Vitals:   10/02/22 0600 10/02/22 0700 10/02/22 0746 10/02/22 0800  BP: (!) 98/59 101/62  95/60  Pulse: 73 66 (!) 59 65  Resp: 20 19 (!) 21 19  Temp:    97.8 F (36.6 C)  TempSrc:    Axillary  SpO2: 97% 95% 94% 94%  Weight:      Height:        Intake/Output Summary (Last 24 hours) at 10/02/2022 0852 Last data filed at 10/02/2022 0800 Gross per 24  hour  Intake 1176.12 ml  Output 3500 ml  Net -2323.88 ml      10/02/2022    3:54 AM 10/01/2022    4:08 AM 10/21/2022    5:41 PM  Last 3 Weights  Weight (lbs) 200 lb 2.8 oz 215 lb 9.8 oz 203 lb 11.3 oz  Weight (kg) 90.8 kg 97.8 kg 92.4 kg      Telemetry    Sinus with unifocal PVC's rate 60-80 - Personally Reviewed  ECG    No new tracings - Personally Reviewed  Physical Exam   GEN: No acute distress.   Neck: Unable to assess JVD due to positioning and body habitus Cardiac: RRR, no murmurs, distant heart tones rubs, or gallops.  Respiratory: Diminished to auscultation bilaterally.Respirations remain unlabored on 40% Bipap GI: Soft, nontender, obese, non-distended  MS: 1+ pitting edema BLE; No deformity. Redness and venous discoloration noted to BLE, various bruises noted to the BUE with dressing to the right forearm Neuro:  Nonfocal  Psych: Normal affect   Labs    High Sensitivity Troponin:   Recent Labs  Lab 10/02/2022 1519 10/16/2022 1816  TROPONINIHS 23* 21*     Chemistry Recent Labs  Lab 10/08/2022 1519 09/30/22 0425 09/30/22 1213 10/01/22 0412 10/02/22 0532  NA 140 143 143 141 137  K 4.8 5.2* 5.0 4.6 4.6  CL 98 99 98 96* 94*  CO2 36* 37* 34* 34* 35*  GLUCOSE 328* 309* 121* 140* 135*  BUN 24* 30* 32* 40* 39*  CREATININE 0.88 0.89 0.82 0.81 0.75  CALCIUM 8.7* 8.7* 8.9 8.8* 8.0*  MG  --  2.0  --   --   --   PROT 7.2  --   --   --   --   ALBUMIN 3.8  --   --   --   --   AST 17  --   --   --   --   ALT 21  --   --   --   --   ALKPHOS 105  --   --   --   --   BILITOT 0.7  --   --   --   --   GFRNONAA >60 >60 >60 >60 >60  ANIONGAP 6 7 11 11 8     Lipids No results for input(s): "CHOL", "TRIG", "HDL", "LABVLDL", "LDLCALC", "CHOLHDL" in the last 168 hours.  Hematology Recent Labs  Lab 09/30/22 0425 10/01/22 0412 10/02/22 0532  WBC 9.9 14.3* 15.1*  RBC 3.34* 3.23* 2.87*  HGB 10.0* 9.8* 8.5*  HCT 33.5* 32.3* 28.6*  MCV 100.3* 100.0 99.7  MCH 29.9 30.3 29.6   MCHC 29.9* 30.3 29.7*  RDW 14.9 14.6 14.7  PLT 208 215 204   Thyroid No results for input(s): "TSH", "FREET4" in the last 168 hours.  BNP Recent Labs  Lab 10/23/2022 1519 09/30/22 0425  BNP 260.4* 325.4*    DDimer  Recent Labs  Lab 10/12/2022 1945  DDIMER 0.93*     Radiology    CT ABDOMEN PELVIS W CONTRAST  Result Date: 10/01/2022 CLINICAL DATA:  Lung nodule. * Tracking Code: BO * EXAM: CT ABDOMEN AND PELVIS WITH CONTRAST TECHNIQUE: Multidetector CT imaging of the abdomen and pelvis was performed using the standard protocol following bolus administration of intravenous contrast. RADIATION DOSE REDUCTION: This exam was performed according to the departmental dose-optimization program which includes automated exposure control, adjustment of the mA and/or kV according to patient size and/or use of iterative reconstruction technique. CONTRAST:  141mL OMNIPAQUE IOHEXOL 300 MG/ML  SOLN COMPARISON:  CT chest 1 day prior, CT abdomen 02/03/2021 FINDINGS: Lower chest: Lung bases are clear. Hepatobiliary: No focal hepatic lesion.  Gallbladder normal. Pancreas: Pancreas is normal. No ductal dilatation. No pancreatic inflammation. Spleen: Normal spleen Adrenals/urinary tract: LEFT adrenal gland is enlarged 16 mm (image 21/series 2). Enlargement new from CT 02/03/2021. Kidneys, ureters and bladder normal. Stomach/Bowel: Stomach, small bowel, appendix, and cecum are normal. Multiple diverticula of the descending colon and sigmoid colon without acute inflammation. Vascular/Lymphatic: Bulky periaortic adenopathy. Lymphoid mass measures 14.5 by 8.7 cm (image 40/series 2) compared to 14.5 by 7.0 cm visually the mass appears slightly larger. Periportal lymph node measures 4.0 cm short axis compared to 3.6 cm. Single nodal mass ventral to the aorta measures 5.5 cm compared to 4.7 cm. No pelvic lymphadenopathy. Reproductive: Prostate normal Other: No free fluid. Musculoskeletal: No aggressive osseous lesion.  IMPRESSION: 1. New enlargement of the LEFT adrenal gland concerning for metastasis. 2. Bulky retroperitoneal periaortic lymphoid mass slightly increased in volume compared to CT 02/03/2021. 3. Bibasilar atelectasis and pleural effusions. Electronically Signed   By: Suzy Bouchard M.D.   On: 10/01/2022 16:56   ECHOCARDIOGRAM COMPLETE  Result Date: 09/30/2022    ECHOCARDIOGRAM REPORT   Patient Name:   KIMBERLY COYE Date of Exam: 09/30/2022 Medical Rec #:  [id number]     Height:       66.0 in Accession #:    [id number]    Weight:       203.7 lb Date of Birth:  [birth year]     BSA:          2.015 m Patient Age:    77 years      BP:           111/65 mmHg Patient Gender: M             HR:           76 bpm. Exam Location:  [facility] Procedure: 2D Echo Indications:     CHF I50.31  History:         Patient has prior history of Echocardiogram examinations.  Sonographer:     Wayland Salinas RDCS Referring Phys:  [id number] MIR Jerilynn Mages [facility] Diagnosing Phys: Buford Dresser MD  Sonographer Comments: Patient is obese, no subcostal window and Technically difficult study due to poor echo windows. Image acquisition challenging due to uncooperative patient, Image acquisition challenging due to respiratory motion and Image acquisition challenging due to patient body habitus. Exam ended due to patient's respiratory distress and patient unable to continue. IMPRESSIONS  1. Left ventricular ejection fraction, by estimation, is 50 to 55%. The left ventricle has low normal function. The left ventricle has no regional wall motion abnormalities. There is mild left ventricular hypertrophy. Left ventricular diastolic function  could not be evaluated. There is the interventricular septum is flattened in systole, consistent with right ventricular pressure overload.  2. Right ventricular systolic function is moderately reduced. The right ventricular size is severely enlarged. There is moderately elevated pulmonary artery systolic pressure. The  estimated right ventricular systolic pressure is 38.7 mmHg.  3. The mitral valve is grossly normal. Trivial mitral valve regurgitation. No evidence of mitral stenosis.  4. The aortic valve is tricuspid. There is mild calcification of the aortic valve. There is mild thickening of the aortic valve. Aortic valve regurgitation is not visualized. Aortic valve sclerosis is present, with no evidence of aortic valve stenosis. Comparison(s): No significant change from prior study. Conclusion(s)/Recommendation(s): Study stopped early due to patient's clinical condition; limited images. With estimated RAP (as IVC not visualized), moderate pulmonary hypertension with RV dilation and reduced RV function. FINDINGS  Left Ventricle: Left ventricular ejection fraction, by estimation, is 50 to 55%. The left ventricle has low normal function. The left ventricle has no regional wall motion abnormalities. The left ventricular internal cavity size was normal in size. There is mild left ventricular hypertrophy. The interventricular septum is flattened in systole, consistent with right ventricular pressure overload. Left ventricular diastolic function could not be evaluated. Right Ventricle: The right ventricular size is severely enlarged. Right vetricular wall thickness was not well visualized. Right ventricular systolic function is moderately reduced. There is moderately elevated pulmonary artery systolic pressure. The tricuspid regurgitant velocity is 3.14 m/s, and with an assumed right atrial pressure of 8 mmHg, the estimated right ventricular systolic pressure is 56.4 mmHg. Left Atrium: Left atrial size was normal in size. Right Atrium: Right atrial size was not well visualized. Pericardium: There is no evidence of pericardial effusion. Mitral Valve: The mitral valve is grossly normal. Trivial mitral valve regurgitation. No evidence of mitral valve stenosis. Tricuspid Valve: The tricuspid valve is normal in structure. Tricuspid valve  regurgitation is trivial. No evidence of tricuspid stenosis. Aortic Valve: The aortic valve is tricuspid. There is mild calcification of the aortic  valve. There is mild thickening of the aortic valve. Aortic valve regurgitation is not visualized. Aortic valve sclerosis is present, with no evidence of aortic valve stenosis. Pulmonic Valve: The pulmonic valve was not well visualized. Pulmonic valve regurgitation is trivial. No evidence of pulmonic stenosis. Aorta: The aortic root and ascending aorta are structurally normal, with no evidence of dilitation. IAS/Shunts: The interatrial septum was not well visualized. Additional Comments: There is a small pleural effusion in the left lateral region.  LEFT VENTRICLE PLAX 2D LVIDd:         5.60 cm   Diastology LVIDs:         3.90 cm   LV e' medial:   7.83 cm/s LV PW:         1.30 cm   LV E/e' medial: 10.3 LV IVS:        1.30 cm LVOT diam:     2.20 cm LVOT Area:     3.80 cm  LEFT ATRIUM           Index LA diam:      3.90 cm 1.94 cm/m LA Vol (A4C): 36.7 ml 18.21 ml/m                        PULMONIC VALVE AORTA                 PV Vmax:        0.96 m/s Ao Root diam: 3.50 cm PV Peak grad:   3.7 mmHg                       RVOT Peak grad: 0 mmHg  MITRAL VALVE               TRICUSPID VALVE MV Area (PHT): 3.56 cm    TR Peak grad:   39.4 mmHg MV Decel Time: 213 msec    TR Vmax:        314.00 cm/s MV E velocity: 80.70 cm/s MV A velocity: 78.10 cm/s  SHUNTS MV E/A ratio:  1.03        Systemic Diam: 2.20 cm Buford Dresser MD Electronically signed by Buford Dresser MD Signature Date/Time: 09/30/2022/1:39:59 PM    Final    CT Angio Chest Pulmonary Embolism (PE) W or WO Contrast  Result Date: 09/30/2022 CLINICAL DATA:  Hypoxic respiratory failure. Evaluate for pulmonary embolism. History of chronic lymphocytic leukemia/lymphoma. * Tracking Code: BO * EXAM: CT ANGIOGRAPHY CHEST WITH CONTRAST TECHNIQUE: Multidetector CT imaging of the chest was performed using the standard  protocol during bolus administration of intravenous contrast. Multiplanar CT image reconstructions and MIPs were obtained to evaluate the vascular anatomy. RADIATION DOSE REDUCTION: This exam was performed according to the departmental dose-optimization program which includes automated exposure control, adjustment of the mA and/or kV according to patient size and/or use of iterative reconstruction technique. CONTRAST:  18mL OMNIPAQUE IOHEXOL 350 MG/ML SOLN COMPARISON:  Plain film 1 day prior.  CT chest 04/13/2021. FINDINGS: Cardiovascular: The quality of this exam for evaluation of pulmonary embolism is good. No evidence of pulmonary embolism. Aortic atherosclerosis. Tortuous thoracic aorta. Moderate cardiomegaly with lipomatous hypertrophy of the interatrial septum. Lad and right coronary artery calcification. Pulmonary artery enlargement, outflow tract 3.1 cm. Mediastinum/Nodes: 1.4 cm node within the azygoesophageal recess measures 1.6 cm on the prior exam. Right infrahilar node of 1.2 cm on 77/4 is similar to on the prior. Right hilar adenopathy is also mild and not  significantly changed. Irregularity along the esophageal lumen is eccentric right including on 26/4 and 44/4, most likely related to enteric contents. Lungs/Pleura: Small right pleural effusion is decreased with minimal loculation superiorly and laterally including on 43/4. Advanced centrilobular emphysema. Right apical scarring. Recurrent or chronic right lower lobe dependent atelectasis. Development of a superior segment right lower lobe lung mass which measures 3.0 x 3.1 cm on 60/5. 3.5 cm craniocaudal on 123/7 Areas of subsegmental atelectasis or scar within the left upper and left lower lobes. Upper Abdomen: Normal imaged portions of the liver, spleen, stomach. Upper abdominal presumed nodal mass of 7.6 cm on 137/4 is grossly similar to on the prior. Left adrenal or abdominal retroperitoneal mass of 4.5 cm on 137/4 is incompletely imaged but  grossly similar previously. Musculoskeletal: Osteopenia. Remote left rib fractures. T5 through T7 mild compression deformities. The T7 compression deformity is similar and the T5-6 compression deformities are new since 2022. Review of the MIP images confirms the above findings. IMPRESSION: 1.  No evidence of pulmonary embolism. 2. New superior segment right lower lobe lung mass, highly suspicious for primary bronchogenic carcinoma. Consider multidisciplinary thoracic oncology consultation for eventual PET and tissue sampling. 3. Tiny right pleural effusion with minimal loculation. 4. Relatively similar thoracic adenopathy which is most likely related to the history of leukemia/lymphoma. Marked upper abdominal adenopathy is suboptimally evaluated but grossly similar to 2022. 5. Aortic atherosclerosis (ICD10-I70.0), coronary artery atherosclerosis and emphysema (ICD10-J43.9). Pulmonary artery enlargement suggests pulmonary arterial hypertension. 6. Probable debris within the esophagus, suggesting dysmotility and/or gastroesophageal reflux. If there are clinical symptoms to suggest underlying esophageal mass, recommend endoscopy. These results will be called to the ordering clinician or representative by the Radiologist Assistant, and communication documented in the PACS or [employer]. Electronically Signed   By: Abigail Miyamoto M.D.   On: 09/30/2022 10:40    Cardiac Studies  TTE 09/30/22 1. Left ventricular ejection fraction, by estimation, is 50 to 55%. The  left ventricle has low normal function. The left ventricle has no regional  wall motion abnormalities. There is mild left ventricular hypertrophy.  Left ventricular diastolic function   could not be evaluated. There is the interventricular septum is flattened  in systole, consistent with right ventricular pressure overload.   2. Right ventricular systolic function is moderately reduced. The right  ventricular size is severely enlarged. There is  moderately elevated  pulmonary artery systolic pressure. The estimated right ventricular  systolic pressure is 95.0 mmHg.   3. The mitral valve is grossly normal. Trivial mitral valve  regurgitation. No evidence of mitral stenosis.   4. The aortic valve is tricuspid. There is mild calcification of the  aortic valve. There is mild thickening of the aortic valve. Aortic valve  regurgitation is not visualized. Aortic valve sclerosis is present, with  no evidence of aortic valve stenosis.   Patient Profile     77 y.o. male with a past medical history of diastolic congestive heart failure, hypertension, COPD on chronic O2 at home of 2-4L, Type II diabetes, insomnia, OSA, restless leg syndrome, who is being seen and evaluated for diastolic congestive heart failure.   Assessment & Plan    Acute on chronic hypoxic and hypercapnic respiratory failure/acute exacerbation of COPD/PNA -Patient became agitated and combative with nursing staff over night, placed back on Precedex and bipap -History of severe COPD and OSA noncompliant with trilogy at home -elevated d-dimer with negative CTA chest for PE -CT of the chest revealed new right lower  lung mass concerning for bronchogenic carcinoma -recommend onocolgy consult -continue to wean FiO2 to maintain O2 sats greater than equal to 92% -continue with nebs, steroids -supportive care  Acute on chronic diastolic congestive heart failure -LVEF 50-55% - -2.3L output in the last 24 hours -continue furosemide 40 mg IVP daily -continue Toprol XL 25 mg daily -continued on losartan 25 mg daily -daily bmp  -daily weights, I&O, low sodium diet  Sepsis/cellulitis of BLE -continued on antibiotic therapy -management per PCCM  Acute metabolic encephalopathy due to hypercapnia and hypoxia -worsened agitation this morning -Precedex restarted -placed back on Bipap  Essential hypertension -blood pressure 95/60 -continued on furosemide 40 mg IVP daily and  toprol xl 25mg  daily -vital signs per unit protocol  Hyperlipidemia -continued on atorvastatin 40 mg daily  Type 2 diabetes -continued on insulin -management per PCCM  Paroxysmal atrial tachycardia -sinus noted on bedside cardiac monitor -continued on Toprol XL -continue cardiac monitoring     For questions or updates, please contact [place] Please consult [email or web address] for contact info under        Signed, Taten Merrow, NP  10/02/2022, 8:52 AM

## 2022-10-02 NOTE — Progress Notes (Signed)
Pt became combative and aggitated . NP came and see the Pt.and ordered for precedex drip.

## 2022-10-02 NOTE — Progress Notes (Signed)
NAME:  Julian Sparks, MRN:  [id number], DOB:  [birth year], LOS: 3 ADMISSION DATE:  09/28/2022, CONSULTATION DATE:  10/13/2022 REFERRING MD:  Sharion Settler NP  REASON FOR CONSULT: Acute on chronic respiratory failure   HPI  77 y.o male with significant PMH of stage III severe COPD on Trelegy vent, chronic respiratory failure with hypoxia and hypercapnia, RLS, OSA, chronic diastolic CHF, type II DM, and HTN, who presented to the ED with chief complaints of shortness of breath associated with productive cough.   ED Course: Initial vital signs showed HR of 101 beats/minute, BP 154/73 mm Hg, the RR 26 breaths/minute, and the oxygen saturation 92% on 4 L and a temperature of 98.17F (36.7C).   Pertinent Labs/Diagnostics Findings: Chemistry: Glucose 328, CO2 36, BUN 24 otherwise unremarkable CMP CBC: WBC: 15.1 Hgb/Hct: 10.9/37.4  Other Lab findings: COVID PCR: Negative, Troponin: 23 BNP: 260 Venous blood Gas result:  pO2 60; pCO2 92; pH 7.25;  HCO3 40.3, %O2 Sat 89.8.  Imaging:  CXR> streaky airspace opacity in bilateral lower lobes concerning for asymmetric pulmonary edema or atypical pneumonia Medications Administered in the ED: DuoNebs, nitroglycerin SL, doxycycline, ceftriaxone, Solu-Medrol 125 mg, Lasix 40 mg IV.  Patient continued to require increased oxygenation up to 6 L via nasal cannula with sats in the 90s.  He was placed on BiPAP and admitted to hospitalist service with suspected ischemic CHF and COPD exacerbation.  Past Medical History  stage III severe COPD on Trelegy vent, chronic respiratory failure with hypoxia and hypercapnia, RLS, OSA, chronic diastolic CHF, type II DM, and HTN  Significant Hospital Events   2/3: Admitted with acute on chronic hypoxic hypercapnic respiratory failure secondary to AECOPD, possible pneumonia and CHF exacerbation.  Patient became severely agitated and removing BiPAP started on Precedex.  PCCM consulted 2/4: Pt weaned off Bipap tolerating nasal  canula.  Currently weaning precedex gtt if able to wean off gtt PCCM team will sign off  2/5: Weaned off BiPAP and Precedex 2/6 back on biPAP, severe SOB,      EVENTS OVERNIGHT Severe SOB On biPAP Given meds for anxiety Likely related to suffocation CT chest Lung mass CT abd large soft tissue mass   Consults:  PCCM  Procedures:  None  Significant Diagnostic Tests:  2/3: Chest Xray> IMPRESSION: Streaky airspace opacities in bilateral lower lobes may represent asymmetric pulmonary edema or atypical pneumonia. Bilateral fissural thickening.  Interim History / Subjective:  -Agitation improved this am currently weaned off precedex gtt. -Net I&O -1083  Micro Data:  2/3: SARS-CoV-2 PCR> negative 2/3: Influenza PCR> negative 2/3: MRSA PCR>>positive  2/3: Strep pneumo urinary antigen>negative  2/3: Legionella urinary antigen>  Antimicrobials:  Doxycycline 2/3> Ceftriaxone 2/3>  OBJECTIVE  Blood pressure (!) 106/54, pulse (!) 57, temperature 97.8 F (36.6 C), temperature source Axillary, resp. rate 17, height 5\' 6"  (1.676 m), weight 90.8 kg, SpO2 94 %.    FiO2 (%):  [40 %] 40 %   Intake/Output Summary (Last 24 hours) at 10/02/2022 1116 Last data filed at 10/02/2022 1100 Gross per 24 hour  Intake 1179.45 ml  Output 2500 ml  Net -1320.55 ml    Filed Weights   09/27/2022 1741 10/01/22 0408 10/02/22 0354  Weight: 92.4 kg 97.8 kg 90.8 kg     REVIEW OF SYSTEMS  PATIENT IS UNABLE TO PROVIDE COMPLETE REVIEW OF SYSTEMS DUE TO SEVERE CRITICAL ILLNESS   PHYSICAL EXAMINATION:  GENERAL:critically ill appearing, +resp distress EYES: Pupils equal, round, reactive to light.  No scleral icterus.  MOUTH: Moist mucosal membrane.  NECK: Supple.  PULMONARY: Lungs clear to auscultation, +rhonchi, +wheezing CARDIOVASCULAR: S1 and S2.  Regular rate and rhythm GASTROINTESTINAL: Soft, nontender, -distended. Positive bowel sounds.  MUSCULOSKELETAL: +edema.  NEUROLOGIC:  obtunded,sedated SKIN:normal, warm to touch, Capillary refill delayed  Pulses present bilaterally         Labs   CBC: Recent Labs  Lab 10/15/2022 1519 09/30/22 0425 10/01/22 0412 10/02/22 0532  WBC 15.1* 9.9 14.3* 15.1*  NEUTROABS 8.3*  --   --   --   HGB 10.9* 10.0* 9.8* 8.5*  HCT 37.4* 33.5* 32.3* 28.6*  MCV 102.2* 100.3* 100.0 99.7  PLT 210 208 215 204     Basic Metabolic Panel: Recent Labs  Lab 10/12/2022 1519 09/30/22 0425 09/30/22 1213 10/01/22 0412 10/02/22 0532  NA 140 143 143 141 137  K 4.8 5.2* 5.0 4.6 4.6  CL 98 99 98 96* 94*  CO2 36* 37* 34* 34* 35*  GLUCOSE 328* 309* 121* 140* 135*  BUN 24* 30* 32* 40* 39*  CREATININE 0.88 0.89 0.82 0.81 0.75  CALCIUM 8.7* 8.7* 8.9 8.8* 8.0*  MG  --  2.0  --   --   --   PHOS  --  3.6  --   --   --     GFR: Estimated Creatinine Clearance: 80.3 mL/min (by C-G formula based on SCr of 0.75 mg/dL). Recent Labs  Lab 10/09/2022 1519 10/12/2022 1920 10/01/2022 1945 10/11/2022 2133 09/30/22 0425 10/01/22 0412 10/02/22 0532  PROCALCITON  --   --  0.12  --  0.11 0.11  --   WBC 15.1*  --   --   --  9.9 14.3* 15.1*  LATICACIDVEN  --  1.2  --  1.0  --   --   --      Liver Function Tests: Recent Labs  Lab 10/15/2022 1519  AST 17  ALT 21  ALKPHOS 105  BILITOT 0.7  PROT 7.2  ALBUMIN 3.8    No results for input(s): "LIPASE", "AMYLASE" in the last 168 hours. No results for input(s): "AMMONIA" in the last 168 hours.  ABG    Component Value Date/Time   PHART 7.4 10/20/2022 2230   PCO2ART 69 (HH) 09/30/2022 2230   PO2ART 96 10/08/2022 2230   HCO3 42.7 (H) 10/12/2022 2230   O2SAT 99.7 10/22/2022 2230    HbA1C: Hemoglobin A1C  Date/Time Value Ref Range Status  12/06/2021 10:54 AM 8.2 (A) 4.0 - 5.6 % Final   Hgb A1c MFr Bld  Date/Time Value Ref Range Status  10/04/2022 07:20 PM 10.1 (H) 4.8 - 5.6 % Final    Comment:    (NOTE) Pre diabetes:          5.7%-6.4%  Diabetes:              >6.4%  Glycemic control  for   <7.0% adults with diabetes   04/14/2021 05:02 AM 6.8 (H) 4.8 - 5.6 % Final    Comment:    (NOTE) Pre diabetes:          5.7%-6.4%  Diabetes:              >6.4%  Glycemic control for   <7.0% adults with diabetes     CBG: Recent Labs  Lab 10/01/22 1600 10/01/22 1946 10/01/22 2335 10/02/22 0318 10/02/22 0736  GLUCAP 198* 265* 144* 148* 118*      Assessment & Plan:  Acute on Chronic Hypoxic and Hypercapnic  Respiratory Failure due to  Acute Exacerbation of COPD due to Pneumonia and dCHF exacerbation    Severe ACUTE Hypoxic and Hypercapnic Respiratory Failure Hx: Severe COPD and OSA non compliant with Trilogy vent at home. CTA Chest results negative for PE, unfortunately shows new right lower lobe lung mass concerning for Bronchogenic Carcinoma - Supplemental O2 prn to maintain O2 saturations 88 to 92% - Bipap prn and qhs  - Follow intermittent Chest X-ray & ABG as needed - Bronchodilators and Pulmicort nebs - IV Solu-Medrol 40 mg bid  - Antibiotics as below  Patient is suffocating and struggling to breathe while on biPAP  INFECTIOUS DISEASE Sepsis   Cellulitis of bilateral lower extremity -continue antibiotics as prescribed -follow up cultures Met SIRS criteria on admission: Heart Rate 102 beats/minute, Respiratory Rate 26 breaths/minute, AMS -Trend WBC and monitor fever curve  -Trend PCT  - Continue IV antibiotics ceftriaxone and doxycycline   Acute on chronic Diastolic HFpEF  PAF Hx: HLD and HTN  - Continuous telemetry monitoring  - Consider holding outpatient antihypertensives due to soft bp's  - Diuresis as BP and renal function permits ~ Continue lasix daily   - Repeat 2D Echocardiogram  with no significant changes from prior LVEF 50-55%    NEUROLOGY ACUTE METABOLIC ENCEPHALOPATHY On precedex High risk for aspiration  Palliative care to consult  Best practice:  Diet: Carb modified  Pain/Anxiety/Delirium protocol (if indicated):  Precedex gtt attempting to wean off  VAP protocol (if indicated): Not indicated DVT prophylaxis: LMWH GI prophylaxis: N/A  Glucose control:  SSI Yes Code Status: DNR Disposition: ICU     DVT/GI PRX  assessed I Assessed the need for Labs I Assessed the need for Foley I Assessed the need for Central Venous Line Family Discussion when available I Assessed the need for Mobilization I made an Assessment of medications to be adjusted accordingly Safety Risk assessment completed  CASE DISCUSSED IN MULTIDISCIPLINARY ROUNDS WITH ICU TEAM     Critical Care Time devoted to patient care services described in this note is 65 minutes.  Critical care was necessary to treat /prevent imminent and life-threatening deterioration. Overall, patient is critically ill, prognosis is guarded.  Patient with Multiorgan failure and at high risk for cardiac arrest and death.    Corrin Parker, M.D.  [facility]  Medical Director [place] Director [facility] Cardio-Pulmonary Department

## 2022-10-03 DIAGNOSIS — J9601 Acute respiratory failure with hypoxia: Secondary | ICD-10-CM | POA: Diagnosis not present

## 2022-10-03 DIAGNOSIS — I509 Heart failure, unspecified: Secondary | ICD-10-CM | POA: Diagnosis not present

## 2022-10-03 DIAGNOSIS — Z66 Do not resuscitate: Secondary | ICD-10-CM | POA: Insufficient documentation

## 2022-10-03 DIAGNOSIS — J441 Chronic obstructive pulmonary disease with (acute) exacerbation: Secondary | ICD-10-CM | POA: Diagnosis not present

## 2022-10-03 DIAGNOSIS — I5033 Acute on chronic diastolic (congestive) heart failure: Secondary | ICD-10-CM | POA: Diagnosis not present

## 2022-10-03 DIAGNOSIS — R918 Other nonspecific abnormal finding of lung field: Secondary | ICD-10-CM | POA: Insufficient documentation

## 2022-10-03 DIAGNOSIS — J9611 Chronic respiratory failure with hypoxia: Secondary | ICD-10-CM

## 2022-10-03 DIAGNOSIS — L03116 Cellulitis of left lower limb: Secondary | ICD-10-CM | POA: Diagnosis not present

## 2022-10-03 DIAGNOSIS — E139 Other specified diabetes mellitus without complications: Secondary | ICD-10-CM

## 2022-10-03 DIAGNOSIS — E785 Hyperlipidemia, unspecified: Secondary | ICD-10-CM

## 2022-10-03 DIAGNOSIS — R109 Unspecified abdominal pain: Secondary | ICD-10-CM | POA: Insufficient documentation

## 2022-10-03 DIAGNOSIS — I4719 Other supraventricular tachycardia: Secondary | ICD-10-CM

## 2022-10-03 LAB — CBC
HCT: 25.7 % — ABNORMAL LOW (ref 39.0–52.0)
Hemoglobin: 7.6 g/dL — ABNORMAL LOW (ref 13.0–17.0)
MCH: 29.5 pg (ref 26.0–34.0)
MCHC: 29.6 g/dL — ABNORMAL LOW (ref 30.0–36.0)
MCV: 99.6 fL (ref 80.0–100.0)
Platelets: 161 10*3/uL (ref 150–400)
RBC: 2.58 MIL/uL — ABNORMAL LOW (ref 4.22–5.81)
RDW: 14.5 % (ref 11.5–15.5)
WBC: 10.7 10*3/uL — ABNORMAL HIGH (ref 4.0–10.5)
nRBC: 0 % (ref 0.0–0.2)

## 2022-10-03 LAB — BASIC METABOLIC PANEL
Anion gap: 6 (ref 5–15)
BUN: 43 mg/dL — ABNORMAL HIGH (ref 8–23)
CO2: 36 mmol/L — ABNORMAL HIGH (ref 22–32)
Calcium: 8.1 mg/dL — ABNORMAL LOW (ref 8.9–10.3)
Chloride: 96 mmol/L — ABNORMAL LOW (ref 98–111)
Creatinine, Ser: 0.74 mg/dL (ref 0.61–1.24)
GFR, Estimated: >60 mL/min (ref >60)
Glucose, Bld: 138 mg/dL — ABNORMAL HIGH (ref 70–99)
Potassium: 4.7 mmol/L (ref 3.5–5.1)
Sodium: 138 mmol/L (ref 135–145)

## 2022-10-03 LAB — GLUCOSE, CAPILLARY
Glucose-Capillary: 109 mg/dL — ABNORMAL HIGH (ref 70–99)
Glucose-Capillary: 154 mg/dL — ABNORMAL HIGH (ref 70–99)
Glucose-Capillary: 289 mg/dL — ABNORMAL HIGH (ref 70–99)
Glucose-Capillary: 388 mg/dL — ABNORMAL HIGH (ref 70–99)
Glucose-Capillary: 209 mg/dL — ABNORMAL HIGH (ref 70–99)

## 2022-10-03 MED ORDER — INSULIN GLARGINE-YFGN 100 UNIT/ML ~~LOC~~ SOLN
10.0000 [IU] | Freq: Two times a day (BID) | SUBCUTANEOUS | Status: DC
  Administered 2022-10-03 – 2022-10-05 (×4): 10 [IU] via SUBCUTANEOUS
  Filled 2022-10-03 (×5): qty 0.1

## 2022-10-03 MED ORDER — INSULIN ASPART 100 UNIT/ML IJ SOLN
5.0000 [IU] | Freq: Three times a day (TID) | INTRAMUSCULAR | Status: DC
  Administered 2022-10-04 (×3): 5 [IU] via SUBCUTANEOUS
  Filled 2022-10-03 (×4): qty 1

## 2022-10-03 MED ORDER — TORSEMIDE 20 MG PO TABS
40.0000 mg | ORAL_TABLET | Freq: Every day | ORAL | Status: DC
  Administered 2022-10-04 – 2022-10-05 (×2): 40 mg via ORAL
  Filled 2022-10-03 (×2): qty 2

## 2022-10-03 MED ORDER — INSULIN ASPART 100 UNIT/ML IJ SOLN
0.0000 [IU] | Freq: Every day | INTRAMUSCULAR | Status: DC
  Administered 2022-10-03 – 2022-10-04 (×2): 2 [IU] via SUBCUTANEOUS
  Filled 2022-10-03 (×2): qty 1

## 2022-10-03 MED ORDER — INSULIN ASPART 100 UNIT/ML IJ SOLN
0.0000 [IU] | Freq: Three times a day (TID) | INTRAMUSCULAR | Status: DC
  Administered 2022-10-04: 15 [IU] via SUBCUTANEOUS
  Administered 2022-10-04: 7 [IU] via SUBCUTANEOUS
  Administered 2022-10-04: 4 [IU] via SUBCUTANEOUS
  Administered 2022-10-05: 3 [IU] via SUBCUTANEOUS
  Filled 2022-10-03 (×4): qty 1

## 2022-10-03 MED ORDER — OXYCODONE HCL 5 MG PO TABS
5.0000 mg | ORAL_TABLET | ORAL | Status: DC | PRN
  Administered 2022-10-03 – 2022-10-04 (×6): 10 mg via ORAL
  Administered 2022-10-05: 5 mg via ORAL
  Filled 2022-10-03: qty 2
  Filled 2022-10-03: qty 1
  Filled 2022-10-03 (×5): qty 2

## 2022-10-03 NOTE — Progress Notes (Signed)
Rounding Note    Patient Name: Julian Sparks Date of Encounter: 10/03/2022  [place] Cardiologist: Ida Rogue, MD   Subjective   Patient denies chest pain. He is down to 4 L O2. Plan for palliative care discussion today.  Inpatient Medications    Scheduled Meds:  atorvastatin  40 mg Oral Daily   budesonide (PULMICORT) nebulizer solution  0.25 mg Nebulization BID   Chlorhexidine Gluconate Cloth  6 each Topical Q0600   docusate sodium  100 mg Oral BID   furosemide  40 mg Intravenous Daily   gabapentin  300 mg Oral TID   insulin aspart  0-20 Units Subcutaneous Q4H   insulin glargine-yfgn  10 Units Subcutaneous Daily   ipratropium-albuterol  3 mL Nebulization TID   losartan  25 mg Oral Daily   methylPREDNISolone (SOLU-MEDROL) injection  40 mg Intravenous Q12H   metoprolol succinate  25 mg Oral Daily    morphine injection  2 mg Intravenous Once   mupirocin ointment  1 Application Nasal BID   mouth rinse  15 mL Mouth Rinse 4 times per day   rOPINIRole  1 mg Oral QHS   senna-docusate  1 tablet Oral BID   Continuous Infusions:  sodium chloride 10 mL/hr at 10/03/22 0700   cefTRIAXone (ROCEPHIN)  IV Stopped (10/02/22 1648)   dexmedetomidine (PRECEDEX) IV infusion 0.3 mcg/kg/hr (10/03/22 0700)   doxycycline (VIBRAMYCIN) IV Stopped (10/03/22 0607)   PRN Meds: acetaminophen **OR** acetaminophen, albuterol, LORazepam, metolazone, morphine injection, nitroGLYCERIN, ondansetron **OR** ondansetron (ZOFRAN) IV, mouth rinse, polyethylene glycol, traZODone   Vital Signs    Vitals:   10/03/22 0700 10/03/22 0732 10/03/22 0813 10/03/22 0829  BP: 102/66     Pulse: (!) 51  74   Resp: 17  (!) 23   Temp:  97.7 F (36.5 C)    TempSrc:  Oral    SpO2: 100% 100% 100% 96%  Weight:      Height:        Intake/Output Summary (Last 24 hours) at 10/03/2022 1327 Last data filed at 10/03/2022 1129 Gross per 24 hour  Intake 1175.14 ml  Output 2850 ml  Net -1674.86 ml       10/03/2022    4:08 AM 10/02/2022    3:54 AM 10/01/2022    4:08 AM  Last 3 Weights  Weight (lbs) 198 lb 6.6 oz 200 lb 2.8 oz 215 lb 9.8 oz  Weight (kg) 90 kg 90.8 kg 97.8 kg      Telemetry    NSR HR 70s - Personally Reviewed  ECG    No new - Personally Reviewed  Physical Exam   GEN: No acute distress.   Neck: No JVD Cardiac: RRR, no murmurs, rubs, or gallops.  Respiratory: Clear to auscultation bilaterally. GI: Soft, nontender, non-distended  MS: No edema; No deformity. Neuro:  Nonfocal  Psych: Normal affect   Labs    High Sensitivity Troponin:   Recent Labs  Lab 09/28/2022 1519 10/02/2022 1816  TROPONINIHS 23* 21*     Chemistry Recent Labs  Lab 10/12/2022 1519 09/30/22 0425 09/30/22 1213 10/01/22 0412 10/02/22 0532 10/03/22 0611  NA 140 143   < > 141 137 138  K 4.8 5.2*   < > 4.6 4.6 4.7  CL 98 99   < > 96* 94* 96*  CO2 36* 37*   < > 34* 35* 36*  GLUCOSE 328* 309*   < > 140* 135* 138*  BUN 24* 30*   < >  40* 39* 43*  CREATININE 0.88 0.89   < > 0.81 0.75 0.74  CALCIUM 8.7* 8.7*   < > 8.8* 8.0* 8.1*  MG  --  2.0  --   --   --   --   PROT 7.2  --   --   --   --   --   ALBUMIN 3.8  --   --   --   --   --   AST 17  --   --   --   --   --   ALT 21  --   --   --   --   --   ALKPHOS 105  --   --   --   --   --   BILITOT 0.7  --   --   --   --   --   GFRNONAA >60 >60   < > >60 >60 >60  ANIONGAP 6 7   < > 11 8 6    < > = values in this interval not displayed.    Lipids No results for input(s): "CHOL", "TRIG", "HDL", "LABVLDL", "LDLCALC", "CHOLHDL" in the last 168 hours.  Hematology Recent Labs  Lab 10/01/22 0412 10/02/22 0532 10/03/22 0611  WBC 14.3* 15.1* 10.7*  RBC 3.23* 2.87* 2.58*  HGB 9.8* 8.5* 7.6*  HCT 32.3* 28.6* 25.7*  MCV 100.0 99.7 99.6  MCH 30.3 29.6 29.5  MCHC 30.3 29.7* 29.6*  RDW 14.6 14.7 14.5  PLT 215 204 161   Thyroid No results for input(s): "TSH", "FREET4" in the last 168 hours.  BNP Recent Labs  Lab 10/15/2022 1519 09/30/22 0425  BNP  260.4* 325.4*    DDimer  Recent Labs  Lab 09/27/2022 1945  DDIMER 0.93*     Radiology    CT ABDOMEN PELVIS W CONTRAST  Result Date: 10/01/2022 CLINICAL DATA:  Lung nodule. * Tracking Code: BO * EXAM: CT ABDOMEN AND PELVIS WITH CONTRAST TECHNIQUE: Multidetector CT imaging of the abdomen and pelvis was performed using the standard protocol following bolus administration of intravenous contrast. RADIATION DOSE REDUCTION: This exam was performed according to the departmental dose-optimization program which includes automated exposure control, adjustment of the mA and/or kV according to patient size and/or use of iterative reconstruction technique. CONTRAST:  110mL OMNIPAQUE IOHEXOL 300 MG/ML  SOLN COMPARISON:  CT chest 1 day prior, CT abdomen 02/03/2021 FINDINGS: Lower chest: Lung bases are clear. Hepatobiliary: No focal hepatic lesion.  Gallbladder normal. Pancreas: Pancreas is normal. No ductal dilatation. No pancreatic inflammation. Spleen: Normal spleen Adrenals/urinary tract: LEFT adrenal gland is enlarged 16 mm (image 21/series 2). Enlargement new from CT 02/03/2021. Kidneys, ureters and bladder normal. Stomach/Bowel: Stomach, small bowel, appendix, and cecum are normal. Multiple diverticula of the descending colon and sigmoid colon without acute inflammation. Vascular/Lymphatic: Bulky periaortic adenopathy. Lymphoid mass measures 14.5 by 8.7 cm (image 40/series 2) compared to 14.5 by 7.0 cm visually the mass appears slightly larger. Periportal lymph node measures 4.0 cm short axis compared to 3.6 cm. Single nodal mass ventral to the aorta measures 5.5 cm compared to 4.7 cm. No pelvic lymphadenopathy. Reproductive: Prostate normal Other: No free fluid. Musculoskeletal: No aggressive osseous lesion. IMPRESSION: 1. New enlargement of the LEFT adrenal gland concerning for metastasis. 2. Bulky retroperitoneal periaortic lymphoid mass slightly increased in volume compared to CT 02/03/2021. 3. Bibasilar  atelectasis and pleural effusions. Electronically Signed   By: Suzy Bouchard M.D.   On: 10/01/2022 16:56    Cardiac  Studies   TTE 09/30/22 1. Left ventricular ejection fraction, by estimation, is 50 to 55%. The  left ventricle has low normal function. The left ventricle has no regional  wall motion abnormalities. There is mild left ventricular hypertrophy.  Left ventricular diastolic function   could not be evaluated. There is the interventricular septum is flattened  in systole, consistent with right ventricular pressure overload.   2. Right ventricular systolic function is moderately reduced. The right  ventricular size is severely enlarged. There is moderately elevated  pulmonary artery systolic pressure. The estimated right ventricular  systolic pressure is 08.6 mmHg.   3. The mitral valve is grossly normal. Trivial mitral valve  regurgitation. No evidence of mitral stenosis.   4. The aortic valve is tricuspid. There is mild calcification of the  aortic valve. There is mild thickening of the aortic valve. Aortic valve  regurgitation is not visualized. Aortic valve sclerosis is present, with  no evidence of aortic valve stenosis.   Patient Profile     77 y.o. male with a past medical history of diastolic congestive heart failure, hypertension, COPD on chronic O2 at home of 2-4L, Type II diabetes, insomnia, OSA, restless leg syndrome, who is being seen and evaluated for diastolic congestive heart failure.    Assessment & Plan    Acute on chronic hypoxic respiratory failure COPD exacerbation PNA - h/o severe COPD and OSA noncompliant with Trilogy at home - elevated ddimer with negative CTA chest for PE - CT chest showed new right lower lung mass concerning for bronchogenic carcinoma - back on Bipap and IV Precedex  - on 4L O2  Acute on chronic diastolic CHF - LVEF 76-19% - Net -5.6L - IV lasix 40mg  daily - continue Toprol and Losartan - strict I/Os, daily weights, and  monitor kidney function with diuresis  Sepsis/cellulitis of BLE - abx per IM  HTN - BP good, continue current medications  GOC - palliative care to see again today to discuss plan  For questions or updates, please contact [place] Please consult [email or web address] for contact info under        Signed, Ivery Nanney Ninfa Meeker, PA-C  10/03/2022, 1:27 PM

## 2022-10-03 NOTE — Progress Notes (Signed)
Palliative Care Progress Note, Assessment & Plan   Patient Name: Julian Sparks       Date: 10/03/2022 DOB: Dec 15, 1945  Age: 77 y.o. MRN#: [id number] Attending Physician: Mercy Riding, MD Primary Care Physician: [name], MD Admit Date: 10/16/2022  Subjective: Patient is sitting up in bed with nasal cannula in place.  He acknowledges my presence and is able to make his wishes known.  After short conversation he becomes short of breath and oxygen drops to low 80s. His daughter Julian Sparks and his granddaughter Julian Sparks are at bedside.  HPI: 77 y.o. male  with past medical history of stage III severe COPD on Trelegy vent, chronic respiratory failure with hypoxia and hypercapnia, RLS, OSA, chronic diastolic CHF, type II DM, and HTN  admitted on 10/20/2022 with SOB.   Pt is being treated for AECOPD, possible PNA, and CHF exacerbation. Pt has been agitated and Precedex gtt started. Pt has been on/off bipap with severe SOB    Summary of counseling/coordination of care: After reviewing the patient's chart, I assessed the patient at bedside.  He endorses pain in his left lower abdomen.  He shares that the medicine he just received has helped improve the pain.  He has no other acute complaints.  After we talked briefly, he began to have shortness of breath and his oxygenation dropped.  We agreed that he would take a rest and I would speak with his family outside of the room.  Pain assessed and regimen discussed. Patient endorses pain relieved with morphine IV. We discussed use of PO opioids to more consistently control pain. Oxy IR 5-10mg  PO q4H recommended. Educations provided to patient and family.   I met with patient's daughter Julian Sparks and granddaughter Julian Sparks in quiet room of ICU.  Brief medical update given.  I  attempted to elicit goals and values important to the patient and family.  Patient family shares they do not want patient to suffer.  They want to make sure that he is kept comfortable and his pain is minimized.  However, they do not want to tell patient the entirety of his current medical diagnosis.  They share that it would be too upsetting and perhaps make him want to "give up".  CODE STATUS, hospice and hospice philosophy, comfort measures, and aggressive medical treatment all reviewed.  Julian Sparks shares she does not believe the patient would want to be accepting of the ventilator (if necessary) since she understands that he would likely never be able to come off of the ventilator.  Therapeutic silence and active listening provided for Julian Sparks and Julian Sparks to share their thoughts and emotions regarding current medical situation.  Emotional support provided.  Family is concerned with when and how medical information is given to patient.  They would like time to discuss amongst themselves. Dr. Patsey Berthold notified via secure chat and came to the ICU to continue goals of care discussions with family.  I returned to bedside later and spoke with family. They have requested that CCM Dr. Patsey Berthold speak with family prior to sharing diagnosis and prognosis with patient.   Addendum: Dr. Patsey Berthold spoke with family and onc consult placed. Ongoing support and further [place] discussions to take place.  Physical Exam Vitals reviewed.  Constitutional:      General: He is not in acute distress.    Appearance: He is not ill-appearing.  HENT:     Head: Normocephalic.  Cardiovascular:     Rate and Rhythm: Normal rate.  Pulmonary:     Breath sounds: Examination of the right-lower field reveals decreased breath sounds. Examination of the left-lower field reveals decreased breath sounds. Decreased breath sounds present.  Chest:     Comments: Left lower chest tenderness with palpation and deep inhalation Musculoskeletal:      Comments: Generalized weakness, dyspnea with conversation  Neurological:     Mental Status: He is alert and oriented to person, place, and time.  Psychiatric:        Mood and Affect: Mood is not anxious.        Behavior: Behavior normal. Behavior is not agitated.             Total Time 90 minutes   [name] L. Ilsa Iha, FNP-BC Palliative Medicine Team Team Phone # [phone]

## 2022-10-03 NOTE — TOC Initial Note (Signed)
Transition of Care [facility]) - Initial/Assessment Note    Patient Details  Name: Julian Sparks MRN: [id number] Date of Birth: October 20, 1945  [facility]) CM/SW Contact:    Shelbie Hutching, RN Phone Number: 10/03/2022, 11:51 AM  Clinical Narrative:                 Patient admitted to the hospital with acute hypoxic respiratory failure, patient is on chronic oxygen at home 2L [place] from [employer], 4L with activity at times.  RNCM met with patient at the bedside, introduced self and explained role in DC planning.  Patient lives alone, his daughter [name] lives right across the street.  Patient is mostly independent he does have all needed DME at home including a trilogy.  He reports trying to wear the Trilogy at home but has a hard time when he feels he can't breath.  Patient wants to get home, he does agree to having someone come out like home health services.  Palliative has been speaking with the family.  TOC will cont to follow.   Expected Discharge Plan: Home w Hospice Care Barriers to Discharge: Continued Medical Work up   Patient Goals and CMS Choice Patient states their goals for this hospitalization and ongoing recovery are:: He wants to get home          Expected Discharge Plan and Services In-house Referral: Hospice / Palliative Care Discharge Planning Services: CM Consult   Living arrangements for the past 2 months: Single Family Home                                      Prior Living Arrangements/Services Living arrangements for the past 2 months: Single Family Home Lives with:: Self Patient language and need for interpreter reviewed:: Yes Do you feel safe going back to the place where you live?: Yes      Need for Family Participation in Patient Care: Yes (Comment) Care giver support system in place?: Yes (comment) Current home services: DME (cane, walker, transport chair, wheelchair, trilogy, oxygen, nebulizer) Criminal Activity/Legal Involvement Pertinent to  Current Situation/Hospitalization: No - Comment as needed  Activities of Daily Living      Permission Sought/Granted Permission sought to share information with : Case Manager, Family Supports Permission granted to share information with : Yes, Verbal Permission Granted  Share Information with NAME: Margaretmary Bayley     Permission granted to share info w Relationship: daughter  Permission granted to share info w Contact Information: [phone]  Emotional Assessment Appearance:: Appears stated age Attitude/Demeanor/Rapport: Engaged Affect (typically observed): Accepting Orientation: : Oriented to Self, Oriented to Place, Oriented to  Time, Oriented to Situation Alcohol / Substance Use: Not Applicable Psych Involvement: No (comment)  Admission diagnosis:  Hypercapnia [R06.89] Hypoxia [R09.02] COPD exacerbation (HCC) [J44.1] Acute on chronic congestive heart failure, unspecified heart failure type (HCC) [I50.9] Acute cough [R05.1] Acute hypoxic respiratory failure ([place]) [J96.01] Patient Active Problem List   Diagnosis Date Noted   Acute hypoxic respiratory failure ([place]) 10/16/2022   Cellulitis of left leg 10/02/2022   Pneumonia of both lungs due to infectious organism    COPD exacerbation (HCC)    OSA (obstructive sleep apnea) 04/11/2021   Ulcer of left foot ([place]) 03/08/2021   Paroxysmal atrial tachycardia    Metabolic alkalosis 56/25/6389   Toxic metabolic encephalopathy 37/34/2876   Lymphoma, small lymphocytic ([place]) 02/04/2021   Acute cor pulmonale (  [place])    Acute on chronic heart failure with preserved ejection fraction (HFpEF) (HCC)    Acute on chronic diastolic CHF (congestive heart failure) ([place]) 01/24/2021   Type 2 diabetes mellitus with hyperlipidemia ([place]) 01/24/2021   Acute on chronic respiratory failure ([place]) 01/24/2021   Hyperkalemia 01/24/2021   Obesity (BMI 30-39.9) 01/24/2021   Shortness of breath 10/26/2020   Essential hypertension 07/13/2020   Cellulitis  of right lower extremity 07/13/2020   Need for influenza vaccination 05/11/2020   Chronic respiratory failure with hypoxia ([place]) 04/27/2020   Bronchitis 04/27/2020   Pleurodynia 04/27/2020   Obesity (BMI 30.0-34.9) 02/17/2020   Diabetes 1.5, managed as type 2 ([place]) 02/17/2020   Adhesive capsulitis of both shoulders 02/17/2020   Edema 07/19/2019   Leg swelling 07/19/2019   Diminished pulses in lower extremity 07/19/2019   Bursitis of shoulder 04/03/2019   Respiratory failure ([place]) 02/04/2019   Full thickness rotator cuff tear 05/23/2016   PCP:  [name], MD Pharmacy:   [place] [place] [facility] [address] AT [place] [facility] [place] [address] Phone: [phone] Fax: [phone]  [facility] - [place], [address] [place] [address] Phone: [phone] Fax: [address] [place] [address] Phone: [phone] Fax: [phone]     Social Determinants of Health ([place]) Social History: [place]: No Food Insecurity (06/20/2022)  Housing: Low Risk  (06/20/2022)  Transportation Needs: No Transportation Needs (06/20/2022)  Utilities: Not At Risk (06/20/2022)  Alcohol Screen: Low Risk  (06/20/2022)  Depression (PHQ2-9): Low Risk  (06/20/2022)  Financial Resource Strain: Low Risk  (06/20/2022)  Physical Activity: Sufficiently Active (06/20/2022)  Social Connections: Socially Isolated (06/20/2022)  Stress: No Stress Concern Present (06/20/2022)  Tobacco Use: Medium Risk (10/01/2022)   SDOH Interventions:     Readmission Risk Interventions    04/14/2021    2:42 PM  Readmission Risk Prevention Plan  Transportation Screening Complete  Medication Review (RN Care Manager) Complete  PCP or Specialist appointment within 3-5 days of discharge Complete   HRI or Home Care Consult Complete  SW Recovery Care/Counseling Consult Complete  Palliative Care Screening Not Applicable  Skilled Nursing Facility Complete

## 2022-10-03 NOTE — Progress Notes (Signed)
PROGRESS NOTE  Julian Sparks OFB:[id number] DOB: 1945-12-24   PCP: [name], MD  Patient is from: Home  DOA: 09/27/2022 LOS: 4  Chief complaints Chief Complaint  Patient presents with   Shortness of Breath     Brief Narrative / Interim history: 77 year old M with PMH of diastolic CHF, COPD, chronic hypoxic RF on 2 to 4 L by [place], NIDDM-2, OSA and RLS who  was admitted to the stepdown unit on the hospitalist service with acute hypoxic and hypercapnic respiratory failure likely due to combination of exacerbation of COPD as well as acute on chronic diastolic congestive heart failure.  There was also concern for pneumonia and bilateral lower extremity cellulitis for which she was started on antibiotics.  He developed severe agitation soon after admission to the critical care unit, was seen by pulmonary critical care due to high risk of intubation.  Patient did not require intubation but BiPAP.  He required Precedex for agitation.   Patient was weaned off BiPAP and Precedex on 2/5 and transferred to [facility] hospitalist service.  However, he required BiPAP again on 2/6 and restarted on Precedex.  He also had acute rectal bleed with bloody bowel movements on 2/6.  CT abdomen and pelvis showed bulky retroperitoneal lymphoid mass and left adrenal gland mass concerning for metastatic disease.  Palliative medicine and oncology consulted.  Cardiology following for acute CHF.  Subjective: Seen and examined earlier this morning.  Per chart review, an  episode of rectal bleed late yesterday.  Hemoglobin trended down to 7.6.  He complains of LUQ pain which is worse with movement, and prevents him from taking deep breaths.  Asking for more pain medication.  Patient's daughter and granddaughter at bedside waiting to meet with palliative medicine.  Objective: Vitals:   10/03/22 1200 10/03/22 1300 10/03/22 1338 10/03/22 1419  BP:  120/67    Pulse: 93 96 (!) 102   Resp: (!) 22 (!) 23 (!) 27   Temp:       TempSrc:      SpO2: 95% 92% (!) 88% 100%  Weight:      Height:        Examination:  GENERAL: No apparent distress.  Nontoxic. HEENT: MMM.  Vision and hearing grossly intact.  NECK: Supple.  No apparent JVD.  RESP:  No IWOB.  Fair aeration bilaterally. CVS:  RRR. Heart sounds normal.  ABD/GI/GU: BS+.  Abdomen is somewhat distended.  Tender over LUQ. MSK/EXT:  Moves extremities. No apparent deformity. No edema.  SKIN: no apparent skin lesion or wound NEURO: Awake, alert and oriented fairly.  No apparent focal neuro deficit. PSYCH: Calm. Normal affect.   Procedures:  None  Microbiology summarized: POEUM-35, influenza and RSV PCR nonreactive. MRSA PCR screen positive.  Assessment and plan: Principal Problem:   Acute hypoxic respiratory failure (HCC) Active Problems:   Obesity (BMI 30.0-34.9)   Diabetes 1.5, managed as type 2 ([place])   Chronic respiratory failure with hypoxia (HCC)   Acute on chronic diastolic CHF (congestive heart failure) (HCC)   Type 2 diabetes mellitus with hyperlipidemia (HCC)   Acute on chronic heart failure with preserved ejection fraction (HFpEF) (HCC)   Toxic metabolic encephalopathy   Paroxysmal atrial tachycardia   OSA (obstructive sleep apnea)   COPD exacerbation (HCC)   Cellulitis of left leg  Severe sepsis due to possible bacterial pneumonia and bilateral lower extremity cellulitis: POA.  Had tachycardia, leukocytosis and respiratory failure.  -Completed antibiotic course with ceftriaxone and doxycycline -Leukocytosis resolving.  Acute hypoxic and hypercarbic respiratory failure: Multifactorial including COPD, pneumonia and CHF.  CT angio chest negative for PE but new RLL lung mass suspicious for bronchogenic carcinoma, relatively similar thoracic adenopathy and marked upper abdominal adenopathy and probable debris's with esophagus suggesting dysmotility/GERD. -Has been stable on 4 L by nasal cannula for most day but requires BiPAP  intermittently. -Aspiration precaution  Acute on chronic diastolic CHF: BNP is elevated to 325.  TTE with LVEF of 50 to 55%, RV pressure overload and RVSP of 47.4 mmHg.  Diuresed with IV Lasix and transition to p.o. torsemide. -Appreciate help by cardiology -Monitor intake and output, renal functions and electrolytes.  Elevated troponin/demand ischemia: TTE as above.  No chest pain. -Defer to cardiology  Possible bronchogenic carcinoma/thoracic and abdominal lymphadenopathy -Oncology consulted.  Uncontrolled NIDDM-2 with hyperglycemia: A1c 10.1%.  Hyperglycemia likely due to steroid. Recent Labs  Lab 10/02/22 2315 10/03/22 0331 10/03/22 0726 10/03/22 1133 10/03/22 1542  GLUCAP 228* 109* 154* 289* 388*  -Increase Semglee from 10 to 10 units twice daily -Continue SSI-resistant.  Add nightly coverage. -Add NovoLog 5 units 3 times daily with meals starting this evening. -Continue home statin.  Acute blood loss and anemia/rectal bleed: Reportedly had bloody bowel movement the evening of 2/6. Recent Labs    10/04/2022 1519 09/30/22 0425 10/01/22 0412 10/02/22 0532 10/03/22 0611  HGB 10.9* 10.0* 9.8* 8.5* 7.6*  -Discontinue Lovenox -SCD for VTE prophylaxis -Monitor H&H  Agitation: Continues to require Precedex. -Pulmonology managing  COPD with acute exacerbation: -Continue Solu-Medrol and nebulizers. -Completed antibiotic course   Abdominal pain: Likely due to malignancy. -As needed pain medication per palliative medicine   Paroxysmal atrial tachycardia -currently stable,  -continue home beta-blocker -Not able to be anticoagulated due to worsening anemia and possible GI bleed   OSA (obstructive sleep apnea) -maintain on BiPAP whenever sleeping     Possible cellulitis of left leg  -Status post antibiotics.  Goal of care: DNR/DNI -Palliative medicine following.  Obesity: Body mass index is 32.02 kg/m.          DVT prophylaxis:  Place and maintain  sequential compression device Start: 10/03/22 0830 SCDs Start: 10/06/2022 1659  Code Status: DNR/DNI Family Communication: Updated patient's daughter and granddaughter at bedside. Level of care: Stepdown Status is: Inpatient Remains inpatient appropriate because: Respiratory failure, acute blood loss anemia,   Final disposition:  Consultants:  Pulmonology Cardiology Palliative medicine Oncology  55 minutes with more than 50% spent in reviewing records, counseling patient/family and coordinating care.   Sch Meds:  Scheduled Meds:  atorvastatin  40 mg Oral Daily   budesonide (PULMICORT) nebulizer solution  0.25 mg Nebulization BID   Chlorhexidine Gluconate Cloth  6 each Topical Q0600   docusate sodium  100 mg Oral BID   gabapentin  300 mg Oral TID   insulin aspart  0-20 Units Subcutaneous Q4H   insulin glargine-yfgn  10 Units Subcutaneous Daily   ipratropium-albuterol  3 mL Nebulization TID   losartan  25 mg Oral Daily   methylPREDNISolone (SOLU-MEDROL) injection  40 mg Intravenous Q12H   metoprolol succinate  25 mg Oral Daily    morphine injection  2 mg Intravenous Once   mupirocin ointment  1 Application Nasal BID   mouth rinse  15 mL Mouth Rinse 4 times per day   rOPINIRole  1 mg Oral QHS   senna-docusate  1 tablet Oral BID   [START ON 10/04/2022] torsemide  40 mg Oral Daily   Continuous Infusions:  sodium  chloride 75 mL/hr at 10/03/22 1533   cefTRIAXone (ROCEPHIN)  IV 1 g (10/03/22 1548)   dexmedetomidine (PRECEDEX) IV infusion Stopped (10/03/22 0834)   doxycycline (VIBRAMYCIN) IV Stopped (10/03/22 0607)   PRN Meds:.acetaminophen **OR** acetaminophen, albuterol, LORazepam, metolazone, morphine injection, nitroGLYCERIN, ondansetron **OR** ondansetron (ZOFRAN) IV, mouth rinse, oxyCODONE, polyethylene glycol, traZODone  Antimicrobials: Anti-infectives (From admission, onward)    Start     Dose/Rate Route Frequency Ordered Stop   09/30/22 1530  cefTRIAXone (ROCEPHIN) 1 g  in sodium chloride 0.9 % 100 mL IVPB        1 g 200 mL/hr over 30 Minutes Intravenous Every 24 hours 10/09/2022 1652 10/03/22 2359   09/30/22 0500  doxycycline (VIBRAMYCIN) 100 mg in sodium chloride 0.9 % 250 mL IVPB        100 mg 125 mL/hr over 120 Minutes Intravenous Every 12 hours 10/10/2022 1652 10/03/22 2359   10/04/2022 1545  doxycycline (VIBRAMYCIN) 100 mg in sodium chloride 0.9 % 250 mL IVPB        100 mg 125 mL/hr over 120 Minutes Intravenous  Once 10/10/2022 1527 10/02/2022 2004   10/16/2022 1530  cefTRIAXone (ROCEPHIN) 1 g in sodium chloride 0.9 % 100 mL IVPB        1 g 200 mL/hr over 30 Minutes Intravenous  Once 10/09/2022 1527 10/09/2022 1613        I have personally reviewed the following labs and images: CBC: Recent Labs  Lab 10/17/2022 1519 09/30/22 0425 10/01/22 0412 10/02/22 0532 10/03/22 0611  WBC 15.1* 9.9 14.3* 15.1* 10.7*  NEUTROABS 8.3*  --   --   --   --   HGB 10.9* 10.0* 9.8* 8.5* 7.6*  HCT 37.4* 33.5* 32.3* 28.6* 25.7*  MCV 102.2* 100.3* 100.0 99.7 99.6  PLT 210 208 215 204 161   BMP &GFR Recent Labs  Lab 09/30/22 0425 09/30/22 1213 10/01/22 0412 10/02/22 0532 10/03/22 0611  NA 143 143 141 137 138  K 5.2* 5.0 4.6 4.6 4.7  CL 99 98 96* 94* 96*  CO2 37* 34* 34* 35* 36*  GLUCOSE 309* 121* 140* 135* 138*  BUN 30* 32* 40* 39* 43*  CREATININE 0.89 0.82 0.81 0.75 0.74  CALCIUM 8.7* 8.9 8.8* 8.0* 8.1*  MG 2.0  --   --   --   --   PHOS 3.6  --   --   --   --    Estimated Creatinine Clearance: 80 mL/min (by C-G formula based on SCr of 0.74 mg/dL). Liver & Pancreas: Recent Labs  Lab 10/09/2022 1519  AST 17  ALT 21  ALKPHOS 105  BILITOT 0.7  PROT 7.2  ALBUMIN 3.8   No results for input(s): "LIPASE", "AMYLASE" in the last 168 hours. No results for input(s): "AMMONIA" in the last 168 hours. Diabetic: No results for input(s): "HGBA1C" in the last 72 hours. Recent Labs  Lab 10/02/22 2315 10/03/22 0331 10/03/22 0726 10/03/22 1133 10/03/22 1542  GLUCAP  228* 109* 154* 289* 388*   Cardiac Enzymes: No results for input(s): "CKTOTAL", "CKMB", "CKMBINDEX", "TROPONINI" in the last 168 hours. No results for input(s): "PROBNP" in the last 8760 hours. Coagulation Profile: No results for input(s): "INR", "PROTIME" in the last 168 hours. Thyroid Function Tests: No results for input(s): "TSH", "T4TOTAL", "FREET4", "T3FREE", "THYROIDAB" in the last 72 hours. Lipid Profile: No results for input(s): "CHOL", "HDL", "LDLCALC", "TRIG", "CHOLHDL", "LDLDIRECT" in the last 72 hours. Anemia Panel: No results for input(s): "VITAMINB12", "FOLATE", "FERRITIN", "TIBC", "IRON", "RETICCTPCT"  in the last 72 hours. Urine analysis:    Component Value Date/Time   COLORURINE YELLOW 02/11/2012 [place] 02/11/2012 1608   LABSPEC 1.010 02/11/2012 1608   PHURINE 6.5 02/11/2012 1608   GLUCOSEU NEGATIVE 02/11/2012 1608   HGBUR 2+ 02/11/2012 1608   BILIRUBINUR NEGATIVE 02/11/2012 1608   KETONESUR NEGATIVE 02/11/2012 1608   PROTEINUR NEGATIVE 02/11/2012 1608   NITRITE NEGATIVE 02/11/2012 1608   LEUKOCYTESUR NEGATIVE 02/11/2012 1608   Sepsis Labs: Invalid input(s): "PROCALCITONIN", "LACTICIDVEN"  Microbiology: Recent Results (from the past 240 hour(s))  Resp panel by RT-PCR (RSV, Flu A&B, Covid)     Status: None   Collection Time: 10/21/2022  3:42 PM  Result Value Ref Range Status   SARS Coronavirus 2 by RT PCR NEGATIVE NEGATIVE Final    Comment: (NOTE) SARS-CoV-2 target nucleic acids are NOT DETECTED.  The SARS-CoV-2 RNA is generally detectable in upper respiratory specimens during the acute phase of infection. The lowest concentration of SARS-CoV-2 viral copies this assay can detect is 138 copies/mL. A negative result does not preclude SARS-Cov-2 infection and should not be used as the sole basis for treatment or other patient management decisions. A negative result may occur with  improper specimen collection/handling, submission of specimen  other than nasopharyngeal swab, presence of viral mutation(s) within the areas targeted by this assay, and inadequate number of viral copies(<138 copies/mL). A negative result must be combined with clinical observations, patient history, and epidemiological information. The expected result is Negative.  Fact Sheet for Patients:  EntrepreneurPulse.com.au  Fact Sheet for Healthcare Providers:  IncredibleEmployment.be  This test is no t yet approved or cleared by the [place] FDA and  has been authorized for detection and/or diagnosis of SARS-CoV-2 by FDA under an Emergency Use Authorization (EUA). This EUA will remain  in effect (meaning this test can be used) for the duration of the COVID-19 declaration under Section 564(b)(1) of the Act, 21 U.S.C.section 360bbb-3(b)(1), unless the authorization is terminated  or revoked sooner.       Influenza A by PCR NEGATIVE NEGATIVE Final   Influenza B by PCR NEGATIVE NEGATIVE Final    Comment: (NOTE) The Xpert Xpress SARS-CoV-2/FLU/RSV plus assay is intended as an aid in the diagnosis of influenza from Nasopharyngeal swab specimens and should not be used as a sole basis for treatment. Nasal washings and aspirates are unacceptable for Xpert Xpress SARS-CoV-2/FLU/RSV testing.  Fact Sheet for Patients: EntrepreneurPulse.com.au  Fact Sheet for Healthcare Providers: IncredibleEmployment.be  This test is not yet approved or cleared by the [place] FDA and has been authorized for detection and/or diagnosis of SARS-CoV-2 by FDA under an Emergency Use Authorization (EUA). This EUA will remain in effect (meaning this test can be used) for the duration of the COVID-19 declaration under Section 564(b)(1) of the Act, 21 U.S.C. section 360bbb-3(b)(1), unless the authorization is terminated or revoked.     Resp Syncytial Virus by PCR NEGATIVE NEGATIVE Final     Comment: (NOTE) Fact Sheet for Patients: EntrepreneurPulse.com.au  Fact Sheet for Healthcare Providers: IncredibleEmployment.be  This test is not yet approved or cleared by the [place] FDA and has been authorized for detection and/or diagnosis of SARS-CoV-2 by FDA under an Emergency Use Authorization (EUA). This EUA will remain in effect (meaning this test can be used) for the duration of the COVID-19 declaration under Section 564(b)(1) of the Act, 21 U.S.C. section 360bbb-3(b)(1), unless the authorization is terminated or revoked.  Performed at [facility], [phone]  [address] [place], [place] [address]   MRSA Next Gen by PCR, Nasal     Status: Abnormal   Collection Time: 10/10/2022  5:48 PM   Specimen: Nasal Mucosa; Nasal Swab  Result Value Ref Range Status   MRSA by PCR Next Gen DETECTED (A) NOT DETECTED Final    Comment: CRITICAL RESULT CALLED TO, READ BACK BY AND VERIFIED WITH: Laurin Coder RN @ 2221 10/20/2022 LFD (NOTE) The GeneXpert MRSA Assay (FDA approved for NASAL specimens only), is one component of a comprehensive MRSA colonization surveillance program. It is not intended to diagnose MRSA infection nor to guide or monitor treatment for MRSA infections. Test performance is not FDA approved in patients less than 65 years old. Performed at [facility], [address] [place], [place] [address]     Radiology Studies: No results found.    Javanna Patin T. [place]  If 7PM-7AM, please contact night-coverage [email or web address] 10/03/2022, 4:13 PM

## 2022-10-04 ENCOUNTER — Encounter: Payer: Medicare HMO | Admitting: Family

## 2022-10-04 DIAGNOSIS — R051 Acute cough: Secondary | ICD-10-CM | POA: Diagnosis not present

## 2022-10-04 DIAGNOSIS — J9601 Acute respiratory failure with hypoxia: Secondary | ICD-10-CM | POA: Diagnosis not present

## 2022-10-04 DIAGNOSIS — R778 Other specified abnormalities of plasma proteins: Secondary | ICD-10-CM

## 2022-10-04 DIAGNOSIS — R1012 Left upper quadrant pain: Secondary | ICD-10-CM

## 2022-10-04 DIAGNOSIS — E1169 Type 2 diabetes mellitus with other specified complication: Secondary | ICD-10-CM | POA: Diagnosis not present

## 2022-10-04 DIAGNOSIS — I509 Heart failure, unspecified: Secondary | ICD-10-CM | POA: Diagnosis not present

## 2022-10-04 DIAGNOSIS — R0689 Other abnormalities of breathing: Secondary | ICD-10-CM

## 2022-10-04 DIAGNOSIS — R0902 Hypoxemia: Secondary | ICD-10-CM

## 2022-10-04 DIAGNOSIS — Z66 Do not resuscitate: Secondary | ICD-10-CM

## 2022-10-04 DIAGNOSIS — R918 Other nonspecific abnormal finding of lung field: Secondary | ICD-10-CM | POA: Diagnosis not present

## 2022-10-04 DIAGNOSIS — J441 Chronic obstructive pulmonary disease with (acute) exacerbation: Secondary | ICD-10-CM | POA: Diagnosis not present

## 2022-10-04 DIAGNOSIS — I5033 Acute on chronic diastolic (congestive) heart failure: Secondary | ICD-10-CM | POA: Diagnosis not present

## 2022-10-04 DIAGNOSIS — Z515 Encounter for palliative care: Secondary | ICD-10-CM

## 2022-10-04 DIAGNOSIS — L03116 Cellulitis of left lower limb: Secondary | ICD-10-CM | POA: Diagnosis not present

## 2022-10-04 LAB — COMPREHENSIVE METABOLIC PANEL
ALT: 15 U/L (ref 0–44)
AST: 10 U/L — ABNORMAL LOW (ref 15–41)
Albumin: 2.9 g/dL — ABNORMAL LOW (ref 3.5–5.0)
Alkaline Phosphatase: 71 U/L (ref 38–126)
Anion gap: 5 (ref 5–15)
BUN: 41 mg/dL — ABNORMAL HIGH (ref 8–23)
CO2: 37 mmol/L — ABNORMAL HIGH (ref 22–32)
Calcium: 8.1 mg/dL — ABNORMAL LOW (ref 8.9–10.3)
Chloride: 98 mmol/L (ref 98–111)
Creatinine, Ser: 0.76 mg/dL (ref 0.61–1.24)
GFR, Estimated: >60 mL/min (ref >60)
Glucose, Bld: 198 mg/dL — ABNORMAL HIGH (ref 70–99)
Potassium: 4.6 mmol/L (ref 3.5–5.1)
Sodium: 140 mmol/L (ref 135–145)
Total Bilirubin: 0.5 mg/dL (ref 0.3–1.2)
Total Protein: 5.2 g/dL — ABNORMAL LOW (ref 6.5–8.1)

## 2022-10-04 LAB — IRON AND TIBC
Iron: 47 ug/dL (ref 45–182)
Saturation Ratios: 20 % (ref 17.9–39.5)
TIBC: 241 ug/dL — ABNORMAL LOW (ref 250–450)
UIBC: 194 ug/dL

## 2022-10-04 LAB — GLUCOSE, CAPILLARY
Glucose-Capillary: 199 mg/dL — ABNORMAL HIGH (ref 70–99)
Glucose-Capillary: 201 mg/dL — ABNORMAL HIGH (ref 70–99)
Glucose-Capillary: 305 mg/dL — ABNORMAL HIGH (ref 70–99)

## 2022-10-04 LAB — FOLATE: Folate: 21 ng/mL (ref >5.9)

## 2022-10-04 LAB — RETICULOCYTES
Immature Retic Fract: 30.8 % — ABNORMAL HIGH (ref 2.3–15.9)
RBC.: 2.6 MIL/uL — ABNORMAL LOW (ref 4.22–5.81)
Retic Count, Absolute: 89.4 10*3/uL (ref 19.0–186.0)
Retic Ct Pct: 3.4 % — ABNORMAL HIGH (ref 0.4–3.1)

## 2022-10-04 LAB — CBC
HCT: 26.5 % — ABNORMAL LOW (ref 39.0–52.0)
Hemoglobin: 7.8 g/dL — ABNORMAL LOW (ref 13.0–17.0)
MCH: 29.7 pg (ref 26.0–34.0)
MCHC: 29.4 g/dL — ABNORMAL LOW (ref 30.0–36.0)
MCV: 100.8 fL — ABNORMAL HIGH (ref 80.0–100.0)
Platelets: 169 10*3/uL (ref 150–400)
RBC: 2.63 MIL/uL — ABNORMAL LOW (ref 4.22–5.81)
RDW: 14.8 % (ref 11.5–15.5)
WBC: 13.4 10*3/uL — ABNORMAL HIGH (ref 4.0–10.5)
nRBC: 0 % (ref 0.0–0.2)

## 2022-10-04 LAB — BRAIN NATRIURETIC PEPTIDE: B Natriuretic Peptide: 136.3 pg/mL — ABNORMAL HIGH (ref 0.0–100.0)

## 2022-10-04 LAB — MAGNESIUM: Magnesium: 2.2 mg/dL (ref 1.7–2.4)

## 2022-10-04 LAB — FERRITIN: Ferritin: 59 ng/mL (ref 24–336)

## 2022-10-04 LAB — VITAMIN B12: Vitamin B-12: 308 pg/mL (ref 180–914)

## 2022-10-04 LAB — LIPASE, BLOOD: Lipase: 27 U/L (ref 11–51)

## 2022-10-04 MED ORDER — POLYETHYLENE GLYCOL 3350 17 G PO PACK: 17.0000 g | PACK | Freq: Two times a day (BID) | ORAL | Status: DC | PRN

## 2022-10-04 MED ORDER — REVEFENACIN 175 MCG/3ML IN SOLN
175.0000 ug | Freq: Every day | RESPIRATORY_TRACT | Status: DC
  Administered 2022-10-05: 175 ug via RESPIRATORY_TRACT
  Filled 2022-10-04 (×2): qty 3

## 2022-10-04 MED ORDER — LORAZEPAM 2 MG/ML IJ SOLN: 1.0000 mg | INTRAMUSCULAR | Status: DC | PRN

## 2022-10-04 MED ORDER — LORAZEPAM 2 MG/ML IJ SOLN: 1.0000 mg | Freq: Four times a day (QID) | INTRAMUSCULAR | Status: DC | PRN

## 2022-10-04 MED ORDER — HYDROMORPHONE HCL 1 MG/ML IJ SOLN
0.5000 mg | INTRAMUSCULAR | Status: DC | PRN
  Administered 2022-10-04 – 2022-10-05 (×2): 0.5 mg via INTRAVENOUS
  Filled 2022-10-04 (×2): qty 1

## 2022-10-04 MED ORDER — ACETAMINOPHEN 325 MG PO TABS
650.0000 mg | ORAL_TABLET | Freq: Four times a day (QID) | ORAL | Status: DC
  Administered 2022-10-04 – 2022-10-05 (×5): 650 mg via ORAL
  Filled 2022-10-04 (×5): qty 2

## 2022-10-04 MED ORDER — GUAIFENESIN ER 600 MG PO TB12
600.0000 mg | ORAL_TABLET | Freq: Two times a day (BID) | ORAL | Status: DC
  Administered 2022-10-04 – 2022-10-05 (×2): 600 mg via ORAL
  Filled 2022-10-04 (×2): qty 1

## 2022-10-04 MED ORDER — GUAIFENESIN-DM 100-10 MG/5ML PO SYRP: 5.0000 mL | ORAL_SOLUTION | ORAL | Status: DC | PRN

## 2022-10-04 MED ORDER — BUDESONIDE 0.5 MG/2ML IN SUSP
0.5000 mg | Freq: Two times a day (BID) | RESPIRATORY_TRACT | Status: DC
  Administered 2022-10-04 – 2022-10-05 (×2): 0.5 mg via RESPIRATORY_TRACT
  Filled 2022-10-04 (×2): qty 2

## 2022-10-04 MED ORDER — IPRATROPIUM-ALBUTEROL 0.5-2.5 (3) MG/3ML IN SOLN: 3.0000 mL | RESPIRATORY_TRACT | Status: DC | PRN

## 2022-10-04 MED ORDER — ARFORMOTEROL TARTRATE 15 MCG/2ML IN NEBU
15.0000 ug | INHALATION_SOLUTION | Freq: Two times a day (BID) | RESPIRATORY_TRACT | Status: DC
  Administered 2022-10-04 – 2022-10-05 (×2): 15 ug via RESPIRATORY_TRACT
  Filled 2022-10-04 (×4): qty 2

## 2022-10-04 MED ORDER — QUETIAPINE FUMARATE 25 MG PO TABS
50.0000 mg | ORAL_TABLET | Freq: Every day | ORAL | Status: DC
  Administered 2022-10-04: 50 mg via ORAL
  Filled 2022-10-04: qty 2

## 2022-10-04 NOTE — Evaluation (Signed)
Occupational Therapy Evaluation Patient Details Name: Julian Sparks MRN: [id number] DOB: 08-22-1946 Today's Date: 10/04/2022   History of Present Illness Pt is a 77 y.o. male presenting to hospital 10/04/2022 with c/o cough, productive sputum, and SOB worsening over last week; also LE and abdominal swelling.  Pt admitted with sepsis, acute hypoxic and hypercarbic respiratory failure, acute on chronic diastolic CHF, and COPD with acute exacerbation.  PMH includes COPD, CHF, DM, htn, OSA, ulcer L foot, paroxysmal atrial tachycardia, lymphoma, acute cor pulmonale, hyperkalemia, adhesive capsulitis B shoulders, full thickness rotator cuff tear s/p repair (L).  Chronic home O2 (2-4 L).   Clinical Impression   Mr Skillen was seen for OT evaluation this date. Prior to hospital admission, pt was IND no AD . Pt lives alone, family nearby. Pt presents to acute OT demonstrating impaired ADL performance and functional mobility 2/2 decreased activity tolerance and functional strength/ROM/balance deficits. Pt currently requires MAX A don B socks bed level. MIN A x2 + RW simulated BSC t/f, poor standing balance/tolerance. SpO2 desat on 4L [place] (baseline) to low 81%, resolved on 6L [place] to 90%. Pt would benefit from skilled OT to address noted impairments and functional limitations (see below for any additional details). Upon hospital discharge, recommend STR to maximize pt safety and return to PLOF. If pt refuses STR then pt may d/c home with 24/7 SUPERVISION.    Recommendations for follow up therapy are one component of a multi-disciplinary discharge planning process, led by the attending physician.  Recommendations may be updated based on patient status, additional functional criteria and insurance authorization.   Follow Up Recommendations  Skilled nursing-short term rehab (<3 hours/day)     Assistance Recommended at Discharge Frequent or constant Supervision/Assistance  Patient can return home with the following A  little help with walking and/or transfers;A lot of help with bathing/dressing/bathroom;Help with stairs or ramp for entrance    Functional Status Assessment  Patient has had a recent decline in their functional status and demonstrates the ability to make significant improvements in function in a reasonable and predictable amount of time.  Equipment Recommendations  BSC/3in1    Recommendations for Other Services       Precautions / Restrictions Precautions Precautions: Fall Precaution Comments: aspiration Restrictions Weight Bearing Restrictions: No      Mobility Bed Mobility Overal bed mobility: Needs Assistance Bed Mobility: Supine to Sit     Supine to sit: Mod assist          Transfers Overall transfer level: Needs assistance Equipment used: Rolling walker (2 wheels) Transfers: Sit to/from Stand Sit to Stand: Min assist, +2 physical assistance                  Balance Overall balance assessment: Needs assistance Sitting-balance support: No upper extremity supported, Feet supported Sitting balance-Leahy Scale: Good     Standing balance support: Bilateral upper extremity supported, During functional activity Standing balance-Leahy Scale: Poor                             ADL either performed or assessed with clinical judgement   ADL Overall ADL's : Needs assistance/impaired                                       General ADL Comments: MAX A don B socks bed level. MIN A x2 +  RW simulated BSC t/f, poor standing balance/tolerance      Pertinent Vitals/Pain Pain Assessment Pain Assessment: Faces Faces Pain Scale: Hurts a little bit Pain Location: abdominal Pain Descriptors / Indicators: Discomfort Pain Intervention(s): Limited activity within patient's tolerance     Hand Dominance Right   Extremity/Trunk Assessment Upper Extremity Assessment Upper Extremity Assessment: Defer to OT evaluation;Overall WFL for tasks  assessed   Lower Extremity Assessment Lower Extremity Assessment: Generalized weakness   Cervical / Trunk Assessment Cervical / Trunk Assessment: Other exceptions Cervical / Trunk Exceptions: forward head/shoulders   Communication Communication Communication: HOH   Cognition Arousal/Alertness: Awake/alert Behavior During Therapy: WFL for tasks assessed/performed Overall Cognitive Status: Within Functional Limits for tasks assessed                                        Home Living Family/patient expects to be discharged to:: Private residence Living Arrangements: Alone Available Help at Discharge: Family;Available PRN/intermittently Type of Home: Mobile home Home Access: Ramped entrance     Home Layout: One level               Home Equipment: Conservation officer, nature (2 wheels);Cane - single point   Additional Comments: Daughter lives across the street.      Prior Functioning/Environment Prior Level of Function : Independent/Modified Independent             Mobility Comments: Independent with ambulation.          OT Problem List: Decreased strength;Decreased range of motion;Decreased activity tolerance;Impaired balance (sitting and/or standing);Decreased safety awareness      OT Treatment/Interventions: Self-care/ADL training;Therapeutic exercise;Energy conservation;DME and/or AE instruction;Therapeutic activities;Patient/family education;Balance training    OT Goals(Current goals can be found in the care plan section) Acute Rehab OT Goals Patient Stated Goal: to go home OT Goal Formulation: With patient Time For Goal Achievement: 10/18/22 Potential to Achieve Goals: Good ADL Goals Pt Will Perform Grooming: with modified independence;standing Pt Will Perform Lower Body Dressing: with modified independence;sit to/from stand Pt Will Transfer to Toilet: with modified independence;ambulating;regular height toilet  OT Frequency: Min 2X/week     Co-evaluation PT/OT/SLP Co-Evaluation/Treatment: Yes Reason for Co-Treatment: Complexity of the patient's impairments (multi-system involvement);For patient/therapist safety;To address functional/ADL transfers PT goals addressed during session: Mobility/safety with mobility OT goals addressed during session: ADL's and self-care      AM-PAC OT "6 Clicks" Daily Activity     Outcome Measure Help from another person eating meals?: None Help from another person taking care of personal grooming?: A Little Help from another person toileting, which includes using toliet, bedpan, or urinal?: A Lot Help from another person bathing (including washing, rinsing, drying)?: A Lot Help from another person to put on and taking off regular upper body clothing?: A Little Help from another person to put on and taking off regular lower body clothing?: A Lot 6 Click Score: 16   End of Session Nurse Communication: Mobility status  Activity Tolerance: Patient tolerated treatment well Patient left: in chair;with call bell/phone within reach;with nursing/sitter in room  OT Visit Diagnosis: Other abnormalities of gait and mobility (R26.89);Muscle weakness (generalized) (M62.81)                Time: 1027-2536 OT Time Calculation (min): 24 min Charges:  OT General Charges $OT Visit: 1 Visit OT Evaluation $OT Eval Moderate Complexity: 1 Mod  Dessie Coma, M.S. OTR/L  10/04/22, 3:09  PM  ascom [phone]

## 2022-10-04 NOTE — Evaluation (Signed)
Clinical/Bedside Swallow Evaluation Patient Details  Name: Julian Sparks MRN: [id number] Date of Birth: 15-Nov-1945  Today's Date: 10/04/2022 Time: SLP Start Time (ACUTE ONLY): 7 SLP Stop Time (ACUTE ONLY): 1055 SLP Time Calculation (min) (ACUTE ONLY): 20 min  Past Medical History:  Past Medical History:  Diagnosis Date   CHF (congestive heart failure) (HCC)    Chronic bronchitis (HCC)    Chronic respiratory failure with hypoxia and hypercapnia (HCC)    non compliant with trilogy   COPD (chronic obstructive pulmonary disease) ([place])    Diabetes mellitus without complication ([place])    Diastolic dysfunction    Dyspnea    with exertion   Hypertension    Past Surgical History:  Past Surgical History:  Procedure Laterality Date   CIRCUMCISION     SHOULDER ARTHROSCOPY WITH OPEN ROTATOR CUFF REPAIR Left 06/20/2016   Procedure: SHOULDER ARTHROSCOPY WITH OPEN ROTATOR CUFF REPAIR;  Surgeon: Earnestine Leys, MD;  Location: [facility] ORS;  Service: Orthopedics;  Laterality: Left;   UPPER GI ENDOSCOPY     HPI:  Per H&P, pt is a 77 y.o. male with medical history significant for diastolic congestive heart failure, COPD chronically on 2 to 4 L nasal cannula oxygen, non-insulin-dependent type 2 diabetes, insomnia, obstructive sleep apnea, restless leg syndrome being admitted to the hospital with acute hypoxic and hypercapnic respiratory failure likely due to combination of exacerbation of COPD as well as acute on chronic diastolic congestive heart failure.  History is provided by the patient's daughter, who lives next door to him and takes close care of him.  She tells me that about 5 or 6 days ago, family noticed that his bilateral lower extremities were swelling, and he started taking an extra dose of Lasix which his cardiologist Dr. Rockey Situ had prescribed.  This seemed to help the swelling.  A couple of days ago, they noticed that he sounded a little congested, but was otherwise doing well.  He is on his  baseline oxygen which is 2 L at rest, 4 L with ambulation, and 6 L at night with his trilogy machine.  This morning, he seemed winded with dyspnea even at rest, so he was brought to the emergency department for evaluation in the afternoon and he continued to get more more short of breath.  Daughter denies any fevers, cough, complaints of chest pain, nausea, vomiting.  His only complaint was dyspnea.    Assessment / Plan / Recommendation  Clinical Impression   Pt seen today for BSE. Pt appeared alert and cooperative throughout eval. RN reported pt "turning red in the face and coughing" during breakfast. Pt w/ declined respiratory status at baseline. Pt's daughter and granddaughter present. Pt left sitting up in bed w/ bed alarm set, call button in reach, and family present.  OF NOTE: Pt w/ baseline congested cough. Noted pt cough at times resulted in redness in face PRIOR TO administration of po's.  Pt on 4L O2 via [place]; afebrile; WBC elevated.  Pt did not appear to present w/ any overt, immediate, or consistent s/s of oropharyngeal dysphagia during eval. Pt at reduced risk of aspiration/aspiration pneumonia following general aspiration precautions including small sips/bites, eating slowly, and sitting upright during meals.  Administered trials of thin liquids and solids during eval. Pt independent w/ self-feeding. During oral phase, pt demonstrated good bolus control/management, strong rotary chewing, timely A-P bolus transit, and clear oral cavity post swallow. OF NOTE: After first bite of solids, pt coughed for ~10 seconds and face turned  red. This cough/distress was NOT noted in subsequent 6+ bites of solid. Cough was not consistent across po's and distress was consistent w/ coughing/redness noted prior to po's. During pharyngeal phase, pt exhibited seemingly timely pharyngeal swallow and clear vocal quality post-po's.  Pt/family educated on SLP POC, diet recommendation, and general aspiration  precautions. Pt/family agreed and appreciative.  Recommend regular diet w/ thin liquids. Recommend General aspiration precautions for all po's. Recommend meds whole w/ puree. Intermittent supervision at meals d/t baseline respiratory status/decline. ST services will monitor pt in next 1-2 days for diet tolerance.  SLP Visit Diagnosis: Dysphagia, unspecified (R13.10)    Aspiration Risk  Mild aspiration risk (d/t respiratory status)    Diet Recommendation   Recommend regular diet w/ thin liquids. Recommend General aspiration precautions for all po's.   Medication Administration: Whole meds with puree    Other  Recommendations Oral Care Recommendations: Oral care QID;Oral care before and after PO    Recommendations for follow up therapy are one component of a multi-disciplinary discharge planning process, led by the attending physician.  Recommendations may be updated based on patient status, additional functional criteria and insurance authorization.  Follow up Recommendations Follow physician's recommendations for discharge plan and follow up therapies      Assistance Recommended at Discharge  Full  Functional Status Assessment Patient has had a recent decline in their functional status and/or demonstrates limited ability to make significant improvements in function in a reasonable and predictable amount of time  Frequency and Duration min 2x/week  2 weeks       Prognosis Prognosis for improved oropharyngeal function: Fair Barriers to Reach Goals: Severity of deficits      Swallow Study   General Date of Onset: 10/03/2022 HPI: Per H&P, pt is a 77 y.o. male with medical history significant for diastolic congestive heart failure, COPD chronically on 2 to 4 L nasal cannula oxygen, non-insulin-dependent type 2 diabetes, insomnia, obstructive sleep apnea, restless leg syndrome being admitted to the hospital with acute hypoxic and hypercapnic respiratory failure likely due to combination of  exacerbation of COPD as well as acute on chronic diastolic congestive heart failure.  History is provided by the patient's daughter, who lives next door to him and takes close care of him.  She tells me that about 5 or 6 days ago, family noticed that his bilateral lower extremities were swelling, and he started taking an extra dose of Lasix which his cardiologist Dr. Rockey Situ had prescribed.  This seemed to help the swelling.  A couple of days ago, they noticed that he sounded a little congested, but was otherwise doing well.  He is on his baseline oxygen which is 2 L at rest, 4 L with ambulation, and 6 L at night with his trilogy machine.  This morning, he seemed winded with dyspnea even at rest, so he was brought to the emergency department for evaluation in the afternoon and he continued to get more more short of breath.  Daughter denies any fevers, cough, complaints of chest pain, nausea, vomiting.  His only complaint was dyspnea. Type of Study: Bedside Swallow Evaluation Previous Swallow Assessment: 01/2021 (No oropharyngeal dysphagia noted) Diet Prior to this Study: Regular;Thin liquids (Level 0) Temperature Spikes Noted: No (WBC 13.4) Respiratory Status: Nasal cannula (4L) History of Recent Intubation: No Behavior/Cognition: Alert;Pleasant mood;Cooperative Oral Cavity Assessment: Within Functional Limits Oral Care Completed by SLP: No Oral Cavity - Dentition: Dentures, top;Dentures, bottom Vision: Functional for self-feeding Self-Feeding Abilities: Able to feed self;Needs  set up Patient Positioning: Upright in bed Baseline Vocal Quality: Normal Volitional Cough: Strong Volitional Swallow: Able to elicit    Oral/Motor/Sensory Function Overall Oral Motor/Sensory Function: Within functional limits   Ice Chips Ice chips: Not tested   Thin Liquid Thin Liquid: Within functional limits (~3-4 oz) Presentation: Cup;Straw;Self Fed    Nectar Thick Nectar Thick Liquid: Not tested   Honey Thick Honey  Thick Liquid: Not tested   Puree Puree: Not tested   Solid     Solid: Within functional limits Presentation: Self Fed Pharyngeal Phase Impairments:  (Cough after first bite, not noted in additional bites or appearing consistent w/ or immediate to po)     Randall Hiss Graduate Clinician [place], Speech Pathology   Randall Hiss 10/04/2022,12:55 PM

## 2022-10-04 NOTE — Progress Notes (Signed)
                                                     Palliative Care Progress Note, Assessment & Plan   Patient Name: Julian Sparks       Date: 10/04/2022 DOB: 1946/05/08  Age: 77 y.o. MRN#: [id number] Attending Physician: Mercy Riding, MD Primary Care Physician: [name], MD Admit Date: 10/14/2022  Subjective: Patient is resting comfortably in recliner in NAD. He shares his breathing is less labored with use of medication. His dauighters Julian Sparks and Julian Sparks and granddaughter Julian Sparks are at bedside.  HPI: 77 y.o. male  with past medical history of stage III severe COPD on Trelegy vent, chronic respiratory failure with hypoxia and hypercapnia, RLS, OSA, chronic diastolic CHF, type II DM, and HTN  admitted on 10/02/2022 with SOB.   Pt is being treated for AECOPD, possible PNA, and CHF exacerbation. Pt has been on/off bipap with severe SOB but is currently on [place].  Summary of counseling/coordination of care: After reviewing the patient's chart and assessing the patient at bedside, I spoke with patient and family at bedside in regards to [facility]. Patient understands he has a large mass as well as severe COPD. He and family do not wish to pursue aggressive measures, biopsy, or treatment plan for mass. Patient verbalized he wants to go home. Family in agreement. They would like to take patient home with hospice services to follow.  Attending and TOC made aware. TOC to offer choice of hospice agencies.   Therapeutic silence and active listening provided for patient and family to share their thoughts and emotions regarding current medical situation.  Emotional support provided.  PMT will remain available to patient and family throughout his hospitalization.   Physical Exam Constitutional:      General: He is not in acute distress.    Appearance: He is well-developed. He  is not ill-appearing.  Cardiovascular:     Rate and Rhythm: Normal rate.  Pulmonary:     Breath sounds: Examination of the left-middle field reveals decreased breath sounds. Examination of the right-lower field reveals decreased breath sounds. Examination of the left-lower field reveals decreased breath sounds. Decreased breath sounds present.  Abdominal:     Palpations: Abdomen is soft.  Musculoskeletal:        General: Normal range of motion.     Comments: Generalized weakness  Neurological:     Mental Status: He is alert and oriented to person, place, and time.  Psychiatric:        Mood and Affect: Mood is not anxious.        Behavior: Behavior normal. Behavior is not agitated.             Total Time 50 minutes   Mykira Hofmeister L. Ilsa Iha, FNP-BC Palliative Medicine Team Team Phone # [phone]

## 2022-10-04 NOTE — Progress Notes (Signed)
In speaking with patient he advised me that a couple weeks ago he had a new furnace put in and they put it in backwards. His carbon monoxide alarms were going off and he wasn't sure what was going off. It ended up killing his 78 year old parrot, almost killing his dog as well. Pt stated his symptoms of not feeling well and having issues with breathing started then.

## 2022-10-04 NOTE — Progress Notes (Signed)
Rounding Note    Patient Name: Julian Sparks Date of Encounter: 10/04/2022  [place] Cardiologist: Ida Rogue, MD   Subjective   Patient denies chest pain. UOP -2L.kidney function stable.  Inpatient Medications    Scheduled Meds:  atorvastatin  40 mg Oral Daily   budesonide (PULMICORT) nebulizer solution  0.25 mg Nebulization BID   Chlorhexidine Gluconate Cloth  6 each Topical Q0600   docusate sodium  100 mg Oral BID   gabapentin  300 mg Oral TID   guaiFENesin  600 mg Oral BID   insulin aspart  0-20 Units Subcutaneous TID WC   insulin aspart  0-5 Units Subcutaneous QHS   insulin aspart  5 Units Subcutaneous TID WC   insulin glargine-yfgn  10 Units Subcutaneous BID   ipratropium-albuterol  3 mL Nebulization TID   losartan  25 mg Oral Daily   methylPREDNISolone (SOLU-MEDROL) injection  40 mg Intravenous Q12H   metoprolol succinate  25 mg Oral Daily    morphine injection  2 mg Intravenous Once   mupirocin ointment  1 Application Nasal BID   mouth rinse  15 mL Mouth Rinse 4 times per day   rOPINIRole  1 mg Oral QHS   senna-docusate  1 tablet Oral BID   torsemide  40 mg Oral Daily   Continuous Infusions:  PRN Meds: acetaminophen **OR** acetaminophen, albuterol, guaiFENesin-dextromethorphan, LORazepam, metolazone, morphine injection, nitroGLYCERIN, ondansetron **OR** ondansetron (ZOFRAN) IV, mouth rinse, oxyCODONE, polyethylene glycol, traZODone   Vital Signs    Vitals:   10/04/22 0754 10/04/22 0800 10/04/22 0806 10/04/22 1000  BP:  122/62  (!) 142/64  Pulse: 83 88  93  Resp:  (!) 22  (!) 23  Temp:      TempSrc:      SpO2:  100% 99% (!) 86%  Weight:      Height:        Intake/Output Summary (Last 24 hours) at 10/04/2022 1055 Last data filed at 10/04/2022 0800 Gross per 24 hour  Intake 1162.41 ml  Output 2600 ml  Net -1437.59 ml      10/03/2022    4:08 AM 10/02/2022    3:54 AM 10/01/2022    4:08 AM  Last 3 Weights  Weight (lbs) 198 lb 6.6 oz 200  lb 2.8 oz 215 lb 9.8 oz  Weight (kg) 90 kg 90.8 kg 97.8 kg      Telemetry    NSR HR 70s - Personally Reviewed  ECG    No new - Personally Reviewed  Physical Exam   GEN: No acute distress.   Neck: No JVD Cardiac: RRR, no murmurs, rubs, or gallops.  Respiratory: wheezing GI: Soft, nontender, non-distended  MS: No edema; No deformity. Neuro:  Nonfocal  Psych: Normal affect   Labs    High Sensitivity Troponin:   Recent Labs  Lab 10/22/2022 1519 10/16/2022 1816  TROPONINIHS 23* 21*     Chemistry Recent Labs  Lab 10/04/2022 1519 09/30/22 0425 09/30/22 1213 10/02/22 0532 10/03/22 0611 10/04/22 0424  NA 140 143   < > 137 138 140  K 4.8 5.2*   < > 4.6 4.7 4.6  CL 98 99   < > 94* 96* 98  CO2 36* 37*   < > 35* 36* 37*  GLUCOSE 328* 309*   < > 135* 138* 198*  BUN 24* 30*   < > 39* 43* 41*  CREATININE 0.88 0.89   < > 0.75 0.74 0.76  CALCIUM 8.7* 8.7*   < >  8.0* 8.1* 8.1*  MG  --  2.0  --   --   --  2.2  PROT 7.2  --   --   --   --  5.2*  ALBUMIN 3.8  --   --   --   --  2.9*  AST 17  --   --   --   --  10*  ALT 21  --   --   --   --  15  ALKPHOS 105  --   --   --   --  71  BILITOT 0.7  --   --   --   --  0.5  GFRNONAA >60 >60   < > >60 >60 >60  ANIONGAP 6 7   < > 8 6 5    < > = values in this interval not displayed.    Lipids No results for input(s): "CHOL", "TRIG", "HDL", "LABVLDL", "LDLCALC", "CHOLHDL" in the last 168 hours.  Hematology Recent Labs  Lab 10/02/22 0532 10/03/22 0611 10/04/22 0424 10/04/22 0425  WBC 15.1* 10.7* 13.4*  --   RBC 2.87* 2.58* 2.63* 2.60*  HGB 8.5* 7.6* 7.8*  --   HCT 28.6* 25.7* 26.5*  --   MCV 99.7 99.6 100.8*  --   MCH 29.6 29.5 29.7  --   MCHC 29.7* 29.6* 29.4*  --   RDW 14.7 14.5 14.8  --   PLT 204 161 169  --    Thyroid No results for input(s): "TSH", "FREET4" in the last 168 hours.  BNP Recent Labs  Lab 10/07/2022 1519 09/30/22 0425 10/04/22 0425  BNP 260.4* 325.4* 136.3*    DDimer  Recent Labs  Lab 09/28/2022 1945   DDIMER 0.93*     Radiology    No results found.  Cardiac Studies   Expand All Collapse All      Rounding Note      Patient Name: Julian Sparks Date of Encounter: 10/03/2022   [place] Cardiologist: Ida Rogue, MD    Subjective    Patient denies chest pain. He is down to 4 L O2. Plan for palliative care discussion today.   Inpatient Medications    Scheduled Meds:  atorvastatin  40 mg Oral Daily   budesonide (PULMICORT) nebulizer solution  0.25 mg Nebulization BID   Chlorhexidine Gluconate Cloth  6 each Topical Q0600   docusate sodium  100 mg Oral BID   furosemide  40 mg Intravenous Daily   gabapentin  300 mg Oral TID   insulin aspart  0-20 Units Subcutaneous Q4H   insulin glargine-yfgn  10 Units Subcutaneous Daily   ipratropium-albuterol  3 mL Nebulization TID   losartan  25 mg Oral Daily   methylPREDNISolone (SOLU-MEDROL) injection  40 mg Intravenous Q12H   metoprolol succinate  25 mg Oral Daily    morphine injection  2 mg Intravenous Once   mupirocin ointment  1 Application Nasal BID   mouth rinse  15 mL Mouth Rinse 4 times per day   rOPINIRole  1 mg Oral QHS   senna-docusate  1 tablet Oral BID    Continuous Infusions:  sodium chloride 10 mL/hr at 10/03/22 0700   cefTRIAXone (ROCEPHIN)  IV Stopped (10/02/22 1648)   dexmedetomidine (PRECEDEX) IV infusion 0.3 mcg/kg/hr (10/03/22 0700)   doxycycline (VIBRAMYCIN) IV Stopped (10/03/22 0607)    PRN Meds: acetaminophen **OR** acetaminophen, albuterol, LORazepam, metolazone, morphine injection, nitroGLYCERIN, ondansetron **OR** ondansetron (ZOFRAN) IV, mouth rinse, polyethylene glycol, traZODone  Vital Signs          Vitals:    10/03/22 0700 10/03/22 0732 10/03/22 0813 10/03/22 0829  BP: 102/66        Pulse: (!) 51   74    Resp: 17   (!) 23    Temp:   97.7 F (36.5 C)      TempSrc:   Oral      SpO2: 100% 100% 100% 96%  Weight:          Height:              Intake/Output Summary (Last  24 hours) at 10/03/2022 1327 Last data filed at 10/03/2022 1129    Gross per 24 hour  Intake 1175.14 ml  Output 2850 ml  Net -1674.86 ml        10/03/2022    4:08 AM 10/02/2022    3:54 AM 10/01/2022    4:08 AM  Last 3 Weights  Weight (lbs) 198 lb 6.6 oz 200 lb 2.8 oz 215 lb 9.8 oz  Weight (kg) 90 kg 90.8 kg 97.8 kg       Telemetry    NSR HR 70s - Personally Reviewed   ECG    No new - Personally Reviewed   Physical Exam    GEN: No acute distress.   Neck: No JVD Cardiac: RRR, no murmurs, rubs, or gallops.  Respiratory: Clear to auscultation bilaterally. GI: Soft, nontender, non-distended  MS: No edema; No deformity. Neuro:  Nonfocal  Psych: Normal affect    Labs    High Sensitivity Troponin:   Last Labs      Recent Labs  Lab 10/20/2022 1519 10/01/2022 1816  TROPONINIHS 23* 21*       Chemistry Last Labs          Recent Labs  Lab 10/14/2022 1519 09/30/22 0425 09/30/22 1213 10/01/22 0412 10/02/22 0532 10/03/22 0611  NA 140 143   < > 141 137 138  K 4.8 5.2*   < > 4.6 4.6 4.7  CL 98 99   < > 96* 94* 96*  CO2 36* 37*   < > 34* 35* 36*  GLUCOSE 328* 309*   < > 140* 135* 138*  BUN 24* 30*   < > 40* 39* 43*  CREATININE 0.88 0.89   < > 0.81 0.75 0.74  CALCIUM 8.7* 8.7*   < > 8.8* 8.0* 8.1*  MG  --  2.0  --   --   --   --   PROT 7.2  --   --   --   --   --   ALBUMIN 3.8  --   --   --   --   --   AST 17  --   --   --   --   --   ALT 21  --   --   --   --   --   ALKPHOS 105  --   --   --   --   --   BILITOT 0.7  --   --   --   --   --   GFRNONAA >60 >60   < > >60 >60 >60  ANIONGAP 6 7   < > 11 8 6    < > = values in this interval not displayed.      Lipids  Last Labs  No results for input(s): "CHOL", "TRIG", "HDL", "LABVLDL", "LDLCALC", "CHOLHDL" in the last 168 hours.  Hematology Last Labs       Recent Labs  Lab 10/01/22 [phone] 10/02/22 0532 10/03/22 0611  WBC 14.3* 15.1* 10.7*  RBC 3.23* 2.87* 2.58*  HGB 9.8* 8.5* 7.6*  HCT 32.3* 28.6* 25.7*  MCV  100.0 99.7 99.6  MCH 30.3 29.6 29.5  MCHC 30.3 29.7* 29.6*  RDW 14.6 14.7 14.5  PLT 215 204 161      Thyroid  Last Labs  No results for input(s): "TSH", "FREET4" in the last 168 hours.    BNP Last Labs      Recent Labs  Lab 10/07/2022 1519 09/30/22 0425  BNP 260.4* 325.4*      DDimer  Last Labs     Recent Labs  Lab 09/28/2022 1945  DDIMER 0.93*        Radiology     Imaging Results (Last 48 hours)  CT ABDOMEN PELVIS W CONTRAST   Result Date: 10/01/2022 CLINICAL DATA:  Lung nodule. * Tracking Code: BO * EXAM: CT ABDOMEN AND PELVIS WITH CONTRAST TECHNIQUE: Multidetector CT imaging of the abdomen and pelvis was performed using the standard protocol following bolus administration of intravenous contrast. RADIATION DOSE REDUCTION: This exam was performed according to the departmental dose-optimization program which includes automated exposure control, adjustment of the mA and/or kV according to patient size and/or use of iterative reconstruction technique. CONTRAST:  174mL OMNIPAQUE IOHEXOL 300 MG/ML  SOLN COMPARISON:  CT chest 1 day prior, CT abdomen 02/03/2021 FINDINGS: Lower chest: Lung bases are clear. Hepatobiliary: No focal hepatic lesion.  Gallbladder normal. Pancreas: Pancreas is normal. No ductal dilatation. No pancreatic inflammation. Spleen: Normal spleen Adrenals/urinary tract: LEFT adrenal gland is enlarged 16 mm (image 21/series 2). Enlargement new from CT 02/03/2021. Kidneys, ureters and bladder normal. Stomach/Bowel: Stomach, small bowel, appendix, and cecum are normal. Multiple diverticula of the descending colon and sigmoid colon without acute inflammation. Vascular/Lymphatic: Bulky periaortic adenopathy. Lymphoid mass measures 14.5 by 8.7 cm (image 40/series 2) compared to 14.5 by 7.0 cm visually the mass appears slightly larger. Periportal lymph node measures 4.0 cm short axis compared to 3.6 cm. Single nodal mass ventral to the aorta measures 5.5 cm compared to 4.7 cm.  No pelvic lymphadenopathy. Reproductive: Prostate normal Other: No free fluid. Musculoskeletal: No aggressive osseous lesion. IMPRESSION: 1. New enlargement of the LEFT adrenal gland concerning for metastasis. 2. Bulky retroperitoneal periaortic lymphoid mass slightly increased in volume compared to CT 02/03/2021. 3. Bibasilar atelectasis and pleural effusions. Electronically Signed   By: Suzy Bouchard M.D.   On: 10/01/2022 16:56       Cardiac Studies    TTE 09/30/22 1. Left ventricular ejection fraction, by estimation, is 50 to 55%. The  left ventricle has low normal function. The left ventricle has no regional  wall motion abnormalities. There is mild left ventricular hypertrophy.  Left ventricular diastolic function   could not be evaluated. There is the interventricular septum is flattened  in systole, consistent with right ventricular pressure overload.   2. Right ventricular systolic function is moderately reduced. The right  ventricular size is severely enlarged. There is moderately elevated  pulmonary artery systolic pressure. The estimated right ventricular  systolic pressure is 08.1 mmHg.   3. The mitral valve is grossly normal. Trivial mitral valve  regurgitation. No evidence of mitral stenosis.   4. The aortic valve is tricuspid. There is mild calcification of the  aortic valve. There is mild thickening of the aortic valve. Aortic valve  regurgitation is not visualized. Aortic valve  sclerosis is present, with  no evidence of aortic valve stenosis.     Patient Profile     77 y.o. male with a past medical history of diastolic congestive heart failure, hypertension, COPD on chronic O2 at home of 2-4L, Type II diabetes, insomnia, OSA, restless leg syndrome, who is being seen and evaluated for diastolic congestive heart failure.     Assessment & Plan    Acute on chronic hypoxic respiratory failure COPD exacerbation PNA - h/o severe COPD and OSA noncompliant with Trilogy at  home - elevated ddimer with negative CTA chest for PE - CT chest showed new right lower lung mass concerning for bronchogenic carcinoma - intermittently on Bipap and IV Precedex  - on 4L O2   Acute on chronic diastolic CHF - LVEF 61-44% - Net -5.6L - IV lasix 40mg  daily transitioned to Torsemide 40mg  BID - continue Toprol and Losartan - strict I/Os, daily weights, and monitor kidney function with diuresis   Sepsis/cellulitis of BLE - abx per IM   HTN - BP good, continue current medications   GOC - palliative care to see again today to discuss plan    For questions or updates, please contact [place] Please consult [email or web address] for contact info under        Signed, Sergi Gellner Ninfa Meeker, PA-C  10/04/2022, 10:55 AM

## 2022-10-04 NOTE — Evaluation (Signed)
Physical Therapy Evaluation Patient Details Name: Julian Sparks MRN: [id number] DOB: 04-19-1946 Today's Date: 10/04/2022  History of Present Illness  Pt is a 77 y.o. male presenting to hospital 09/28/2022 with c/o cough, productive sputum, and SOB worsening over last week; also LE and abdominal swelling.  Pt admitted with sepsis, acute hypoxic and hypercarbic respiratory failure, acute on chronic diastolic CHF, and COPD with acute exacerbation.  PMH includes COPD, CHF, DM, htn, OSA, ulcer L foot, paroxysmal atrial tachycardia, lymphoma, acute cor pulmonale, hyperkalemia, adhesive capsulitis B shoulders, full thickness rotator cuff tear s/p repair (L).  Chronic home O2 (2-4 L).  Clinical Impression  Pt seen for PT/OT co-evaluation.  Prior to hospital admission, pt was independent with ambulation; uses chronic home O2; lives alone in 1 level home with ramp to enter; pt's daughter lives across the street.  Currently pt is mod assist semi-supine to sitting edge of bed; min assist x2 with transfers using RW; and min assist x2 to ambulate a few feet bed to recliner with RW use.  Pt's O2 sats decreased from 93% on 4 L O2 at rest to 81% on 4L O2 after getting to recliner and required increase in supplemental O2 (nurse cleared therapist to increased O2 to 6L) to obtain O2 sats at 90% end of session at rest.  Pt would benefit from skilled PT to address noted impairments and functional limitations (see below for any additional details).  Upon hospital discharge, pt would benefit from SNF.    Recommendations for follow up therapy are one component of a multi-disciplinary discharge planning process, led by the attending physician.  Recommendations may be updated based on patient status, additional functional criteria and insurance authorization.  Follow Up Recommendations Skilled nursing-short term rehab (<3 hours/day) Can patient physically be transported by private vehicle: No    Assistance Recommended at Discharge  Frequent or constant Supervision/Assistance  Patient can return home with the following  A lot of help with walking and/or transfers;A lot of help with bathing/dressing/bathroom;Assistance with cooking/housework;Assist for transportation;Help with stairs or ramp for entrance    Equipment Recommendations Other (comment);Rolling walker (2 wheels) (pt has RW at home already)  Recommendations for Other Services       Functional Status Assessment Patient has had a recent decline in their functional status and demonstrates the ability to make significant improvements in function in a reasonable and predictable amount of time.     Precautions / Restrictions Precautions Precautions: Fall Precaution Comments: aspiration Restrictions Weight Bearing Restrictions: No      Mobility  Bed Mobility Overal bed mobility: Needs Assistance Bed Mobility: Supine to Sit     Supine to sit: Mod assist, HOB elevated     General bed mobility comments: assist for trunk; vc's for technique    Transfers Overall transfer level: Needs assistance Equipment used: Rolling walker (2 wheels) Transfers: Sit to/from Stand Sit to Stand: Min assist, +2 physical assistance           General transfer comment: assist to initiate stand and control descent sitting; vc's for safety    Ambulation/Gait Ambulation/Gait assistance: Min assist, +2 physical assistance Gait Distance (Feet): 3 Feet (bed to recliner) Assistive device: Rolling walker (2 wheels)   Gait velocity: decreased     General Gait Details: decreased B LE step length/foot clearance; assist to steady  Stairs            Wheelchair Mobility    Modified Rankin (Stroke Patients Only)  Balance Overall balance assessment: Needs assistance Sitting-balance support: No upper extremity supported, Feet supported Sitting balance-Leahy Scale: Good Sitting balance - Comments: steady sitting reaching within BOS   Standing balance  support: Bilateral upper extremity supported, During functional activity, Reliant on assistive device for balance Standing balance-Leahy Scale: Poor Standing balance comment: assist to steady                             Pertinent Vitals/Pain Pain Assessment Pain Assessment: Faces Faces Pain Scale: Hurts a little bit Pain Location: abdominal Pain Descriptors / Indicators: Discomfort Pain Intervention(s): Limited activity within patient's tolerance, Monitored during session, Repositioned HR stable and WFL throughout treatment session.    Home Living Family/patient expects to be discharged to:: Private residence Living Arrangements: Alone Available Help at Discharge: Family;Available PRN/intermittently Type of Home: Mobile home Home Access: Ramped entrance       Home Layout: One level Home Equipment: Conservation officer, nature (2 wheels);Cane - single point Additional Comments: Daughter lives across the street.    Prior Function Prior Level of Function : Independent/Modified Independent             Mobility Comments: Independent with ambulation.       Hand Dominance   Dominant Hand: Right    Extremity/Trunk Assessment   Upper Extremity Assessment Upper Extremity Assessment: Defer to OT evaluation;Overall WFL for tasks assessed    Lower Extremity Assessment Lower Extremity Assessment: Generalized weakness    Cervical / Trunk Assessment Cervical / Trunk Assessment: Other exceptions Cervical / Trunk Exceptions: forward head/shoulders  Communication   Communication: HOH  Cognition Arousal/Alertness: Awake/alert Behavior During Therapy: WFL for tasks assessed/performed Overall Cognitive Status: Within Functional Limits for tasks assessed                                          General Comments  Nursing cleared pt for participation in physical therapy.  Pt agreeable to therapy session.    Exercises     Assessment/Plan    PT Assessment  Patient needs continued PT services  PT Problem List Decreased strength;Decreased activity tolerance;Decreased balance;Decreased mobility;Decreased knowledge of use of DME;Decreased knowledge of precautions;Cardiopulmonary status limiting activity;Pain       PT Treatment Interventions DME instruction;Gait training;Functional mobility training;Therapeutic activities;Therapeutic exercise;Balance training;Patient/family education    PT Goals (Current goals can be found in the Care Plan section)  Acute Rehab PT Goals Patient Stated Goal: to improve strength and mobility PT Goal Formulation: With patient Time For Goal Achievement: 10/18/22 Potential to Achieve Goals: Good    Frequency Min 2X/week     Co-evaluation   Reason for Co-Treatment: Complexity of the patient's impairments (multi-system involvement);For patient/therapist safety;To address functional/ADL transfers PT goals addressed during session: Mobility/safety with mobility OT goals addressed during session: ADL's and self-care       AM-PAC PT "6 Clicks" Mobility  Outcome Measure Help needed turning from your back to your side while in a flat bed without using bedrails?: A Little Help needed moving from lying on your back to sitting on the side of a flat bed without using bedrails?: A Lot Help needed moving to and from a bed to a chair (including a wheelchair)?: A Lot Help needed standing up from a chair using your arms (e.g., wheelchair or bedside chair)?: A Lot Help needed to walk in hospital room?: Total  Help needed climbing 3-5 steps with a railing? : Total 6 Click Score: 11    End of Session Equipment Utilized During Treatment: Oxygen Activity Tolerance: Patient limited by fatigue Patient left: in chair;with call bell/phone within reach;Other (comment) (nurse cleared pt to stay in chair without chair alarm) Nurse Communication: Mobility status;Precautions;Other (comment) (pt's O2 sats during session) PT Visit  Diagnosis: Unsteadiness on feet (R26.81);Muscle weakness (generalized) (M62.81);Other abnormalities of gait and mobility (R26.89)    Time: 1335-1400 PT Time Calculation (min) (ACUTE ONLY): 25 min   Charges:   PT Evaluation $PT Eval Low Complexity: 1 Low         Jailani Hogans, PT 10/04/22, 3:15 PM

## 2022-10-04 NOTE — Progress Notes (Signed)
PROGRESS NOTE  Julian Sparks LGX:[id number] DOB: 04/12/1946   PCP: [name], MD  Patient is from: Home  DOA: 10/02/2022 LOS: 5  Chief complaints Chief Complaint  Patient presents with   Shortness of Breath     Brief Narrative / Interim history: 77 year old M with PMH of diastolic CHF, COPD, chronic hypoxic RF on 2 to 4 L by [place], NIDDM-2, OSA and RLS who  was admitted to the stepdown unit on the hospitalist service with acute hypoxic and hypercapnic respiratory failure likely due to combination of exacerbation of COPD as well as acute on chronic diastolic congestive heart failure.  CT angio chest negative for PE but new RLL mass concerning for primary bronchogenic carcinoma, thoracic and abdominal lymphadenopathy, emphysema, possible pulmonary hypertension and debris's within the esophagus suggesting dysmotility of GERD.  There was also concern for pneumonia and bilateral lower extremity cellulitis for which she was started on antibiotics.  He developed severe agitation soon after admission to the critical care unit, was seen by pulmonary critical care due to high risk of intubation.  Patient did not require intubation but BiPAP.  He required Precedex for agitation.   Patient was weaned off BiPAP and Precedex on 2/5 and transferred to [facility] hospitalist service.   He had an episode of rectal bleed with bloody bowel movements on 2/6.  Hgb reached nadir at 7.6 and remained stable.  CT abdomen and pelvis showed bulky retroperitoneal lymphoid mass and left adrenal gland mass concerning for metastatic disease.  Palliative medicine and cardiology following.  Oncology consulted as well.  Subjective: Seen and examined earlier this morning.  No major events overnight of this morning.  He reports dry cough and inability to take good deep breaths due to abdominal pain.  Denies chest pain.   Objective: Vitals:   10/04/22 0800 10/04/22 0806 10/04/22 1000 10/04/22 1100  BP: 122/62  (!) 142/64 (!) 108/51   Pulse: 88  93   Resp: (!) 22  (!) 23   Temp:      TempSrc:      SpO2: 100% 99% (!) 86%   Weight:      Height:        Examination:  GENERAL: No apparent distress.  Nontoxic. HEENT: MMM.  Vision and hearing grossly intact.  NECK: Supple.  No apparent JVD.  RESP:  No IWOB.  Diminished aeration bilaterally partly due to poor inspiratory effort. CVS:  RRR. Heart sounds normal.  ABD/GI/GU: BS+. Abd slightly distended but MSK/EXT:   No apparent deformity. Moves extremities.  1+ edema in all extremities. SKIN: no apparent skin lesion or wound NEURO: Awake and alert. Oriented fairly.  No apparent focal neuro deficit. PSYCH: Calm. Normal affect.   Procedures:  None  Microbiology summarized: CXKGY-18, influenza and RSV PCR nonreactive. MRSA PCR screen positive.  Assessment and plan: Principal Problem:   Acute hypoxic respiratory failure (HCC) Active Problems:   Obesity (BMI 30.0-34.9)   Diabetes 1.5, managed as type 2 ([place])   Chronic respiratory failure with hypoxia (HCC)   Acute on chronic diastolic CHF (congestive heart failure) (HCC)   Type 2 diabetes mellitus with hyperlipidemia (HCC)   Acute on chronic congestive heart failure (HCC)   Toxic metabolic encephalopathy   Paroxysmal atrial tachycardia   OSA (obstructive sleep apnea)   COPD exacerbation (HCC)   Cellulitis of left leg   Right lower lobe lung mass   DNR (do not resuscitate)   Abdominal pain   Hypercapnia   Hypoxia  Palliative care by specialist  Severe sepsis due to possible bacterial pneumonia and bilateral lower extremity cellulitis: POA.  Had tachycardia, leukocytosis and respiratory failure.  However, lactic acid and Pro-Cal negative.  Completed 5 days of CTX and doxycycline and 2/7.  Leukocytosis slightly up today likely from steroid. -Continue trending leukocytosis  Acute hypoxic and hypercarbic respiratory failure: Multifactorial including COPD, pneumonia, CHF and abdominal pain.  CT angio chest  negative for PE but new RLL lung mass suspicious for bronchogenic carcinoma, relatively similar thoracic adenopathy and marked upper abdominal adenopathy and probable debris's with esophagus suggesting dysmotility/GERD. -Discontinue Solu-Medrol. -Start Garlon Hatchet and Maretta Bees.  Increase budesonide to 0.5 mg twice daily -Change as needed albuterol to DuoNebs -Mucolytic's and antitussive. -Wean oxygen as able. -BiPAP as needed -Continue DuoNebs -Aspiration precaution -Incentive spirometry, OOB/PT/OT  Acute on chronic diastolic CHF: BNP is elevated to 325 but improved to 136.Marland Kitchen  TTE with LVEF of 50 to 55%, RV pressure overload and RVSP of 47.4 mmHg.  Diuresed with IV Lasix and transition to p.o. torsemide.  Still with edema in all extremities. -Appreciate help by cardiology-on Demadex, losartan and Toprol-XL. -Monitor intake and output, renal functions and electrolytes.  COPD with acute exacerbation: -Management as above.  Elevated troponin/demand ischemia: TTE as above.  No chest pain. -Defer to cardiology  Possible bronchogenic carcinoma/thoracic and abdominal lymphadenopathy -Oncology consulted.  Uncontrolled NIDDM-2 with hyperglycemia: A1c 10.1%.  Hyperglycemia likely due to steroid. Recent Labs  Lab 10/03/22 1133 10/03/22 1542 10/03/22 2113 10/04/22 0723 10/04/22 1103  GLUCAP 289* 388* 209* 199* 201*  -Increase Semglee from 10 to 10 units twice daily -Continue SSI-resistant.  Add nightly coverage. -Add NovoLog 5 units 3 times daily with meals starting this evening. -Continue home statin.  Acute blood loss and anemia/rectal bleed: Reportedly had bloody bowel movement the evening of 2/6. Recent Labs    10/12/2022 1519 09/30/22 0425 10/01/22 0412 10/02/22 0532 10/03/22 0611 10/04/22 0424  HGB 10.9* 10.0* 9.8* 8.5* 7.6* 7.8*  -Discontinue Lovenox -SCD for VTE prophylaxis -Monitor H&H  Agitation: Off Precedex.  Improved. -Decrease Ativan to 1 every 6 hours as needed -Start  Seroquel nightly -Reorientation delirium precautions.   Abdominal pain: Likely due to malignancy. -Adjusted pain medication -Continue bowel regimen.   Paroxysmal atrial tachycardia -currently stable,  -continue home beta-blocker -Not able to be anticoagulated due to worsening anemia and possible GI bleed   OSA (obstructive sleep apnea) -maintain on BiPAP whenever sleeping   Possible cellulitis of left leg  -Status post antibiotics.  Goal of care: DNR/DNI -Palliative medicine following.  Obesity: Body mass index is 32.02 kg/m.          DVT prophylaxis:  Place and maintain sequential compression device Start: 10/03/22 0830 SCDs Start: 10/18/2022 1659  Code Status: DNR/DNI Family Communication: None at bedside today. Level of care: Progressive Status is: Inpatient Remains inpatient appropriate because: Respiratory failure, acute blood loss anemia,   Final disposition:  Consultants:  Pulmonology Cardiology Palliative medicine Oncology  55 minutes with more than 50% spent in reviewing records, counseling patient/family and coordinating care.   Sch Meds:  Scheduled Meds:  atorvastatin  40 mg Oral Daily   budesonide (PULMICORT) nebulizer solution  0.25 mg Nebulization BID   Chlorhexidine Gluconate Cloth  6 each Topical Q0600   docusate sodium  100 mg Oral BID   gabapentin  300 mg Oral TID   guaiFENesin  600 mg Oral BID   insulin aspart  0-20 Units Subcutaneous TID WC   insulin aspart  0-5 Units Subcutaneous QHS   insulin aspart  5 Units Subcutaneous TID WC   insulin glargine-yfgn  10 Units Subcutaneous BID   ipratropium-albuterol  3 mL Nebulization TID   losartan  25 mg Oral Daily   methylPREDNISolone (SOLU-MEDROL) injection  40 mg Intravenous Q12H   metoprolol succinate  25 mg Oral Daily    morphine injection  2 mg Intravenous Once   mupirocin ointment  1 Application Nasal BID   mouth rinse  15 mL Mouth Rinse 4 times per day   rOPINIRole  1 mg Oral QHS    senna-docusate  1 tablet Oral BID   torsemide  40 mg Oral Daily   Continuous Infusions:   PRN Meds:.acetaminophen **OR** acetaminophen, albuterol, guaiFENesin-dextromethorphan, LORazepam, metolazone, morphine injection, nitroGLYCERIN, ondansetron **OR** ondansetron (ZOFRAN) IV, mouth rinse, oxyCODONE, polyethylene glycol, traZODone  Antimicrobials: Anti-infectives (From admission, onward)    Start     Dose/Rate Route Frequency Ordered Stop   09/30/22 1530  cefTRIAXone (ROCEPHIN) 1 g in sodium chloride 0.9 % 100 mL IVPB        1 g 200 mL/hr over 30 Minutes Intravenous Every 24 hours 10/22/2022 1652 10/03/22 1619   09/30/22 0500  doxycycline (VIBRAMYCIN) 100 mg in sodium chloride 0.9 % 250 mL IVPB        100 mg 125 mL/hr over 120 Minutes Intravenous Every 12 hours 10/07/2022 1652 10/03/22 1940   10/14/2022 1545  doxycycline (VIBRAMYCIN) 100 mg in sodium chloride 0.9 % 250 mL IVPB        100 mg 125 mL/hr over 120 Minutes Intravenous  Once 10/06/2022 1527 09/30/2022 2004   10/17/2022 1530  cefTRIAXone (ROCEPHIN) 1 g in sodium chloride 0.9 % 100 mL IVPB        1 g 200 mL/hr over 30 Minutes Intravenous  Once 10/12/2022 1527 10/19/2022 1613        I have personally reviewed the following labs and images: CBC: Recent Labs  Lab 10/22/2022 1519 09/30/22 0425 10/01/22 0412 10/02/22 0532 10/03/22 0611 10/04/22 0424  WBC 15.1* 9.9 14.3* 15.1* 10.7* 13.4*  NEUTROABS 8.3*  --   --   --   --   --   HGB 10.9* 10.0* 9.8* 8.5* 7.6* 7.8*  HCT 37.4* 33.5* 32.3* 28.6* 25.7* 26.5*  MCV 102.2* 100.3* 100.0 99.7 99.6 100.8*  PLT 210 208 215 204 161 169   BMP &GFR Recent Labs  Lab 09/30/22 0425 09/30/22 1213 10/01/22 0412 10/02/22 0532 10/03/22 0611 10/04/22 0424  NA 143 143 141 137 138 140  K 5.2* 5.0 4.6 4.6 4.7 4.6  CL 99 98 96* 94* 96* 98  CO2 37* 34* 34* 35* 36* 37*  GLUCOSE 309* 121* 140* 135* 138* 198*  BUN 30* 32* 40* 39* 43* 41*  CREATININE 0.89 0.82 0.81 0.75 0.74 0.76  CALCIUM 8.7* 8.9  8.8* 8.0* 8.1* 8.1*  MG 2.0  --   --   --   --  2.2  PHOS 3.6  --   --   --   --   --    Estimated Creatinine Clearance: 80 mL/min (by C-G formula based on SCr of 0.76 mg/dL). Liver & Pancreas: Recent Labs  Lab 10/14/2022 1519 10/04/22 0424  AST 17 10*  ALT 21 15  ALKPHOS 105 71  BILITOT 0.7 0.5  PROT 7.2 5.2*  ALBUMIN 3.8 2.9*   Recent Labs  Lab 10/04/22 0424  LIPASE 27   No results for input(s): "AMMONIA" in the last 168 hours.  Diabetic: No results for input(s): "HGBA1C" in the last 72 hours. Recent Labs  Lab 10/03/22 1133 10/03/22 1542 10/03/22 2113 10/04/22 0723 10/04/22 1103  GLUCAP 289* 388* 209* 199* 201*   Cardiac Enzymes: No results for input(s): "CKTOTAL", "CKMB", "CKMBINDEX", "TROPONINI" in the last 168 hours. No results for input(s): "PROBNP" in the last 8760 hours. Coagulation Profile: No results for input(s): "INR", "PROTIME" in the last 168 hours. Thyroid Function Tests: No results for input(s): "TSH", "T4TOTAL", "FREET4", "T3FREE", "THYROIDAB" in the last 72 hours. Lipid Profile: No results for input(s): "CHOL", "HDL", "LDLCALC", "TRIG", "CHOLHDL", "LDLDIRECT" in the last 72 hours. Anemia Panel: Recent Labs    10/04/22 0424 10/04/22 0425  VITAMINB12  --  308  FOLATE 21.0  --   FERRITIN 59  --   TIBC 241*  --   IRON 47  --   RETICCTPCT  --  3.4*   Urine analysis:    Component Value Date/Time   COLORURINE YELLOW 02/11/2012 [place] 02/11/2012 1608   LABSPEC 1.010 02/11/2012 1608   PHURINE 6.5 02/11/2012 1608   GLUCOSEU NEGATIVE 02/11/2012 1608   HGBUR 2+ 02/11/2012 1608   BILIRUBINUR NEGATIVE 02/11/2012 1608   KETONESUR NEGATIVE 02/11/2012 1608   PROTEINUR NEGATIVE 02/11/2012 1608   NITRITE NEGATIVE 02/11/2012 1608   LEUKOCYTESUR NEGATIVE 02/11/2012 1608   Sepsis Labs: Invalid input(s): "PROCALCITONIN", "LACTICIDVEN"  Microbiology: Recent Results (from the past 240 hour(s))  Resp panel by RT-PCR (RSV, Flu A&B,  Covid)     Status: None   Collection Time: 10/10/2022  3:42 PM  Result Value Ref Range Status   SARS Coronavirus 2 by RT PCR NEGATIVE NEGATIVE Final    Comment: (NOTE) SARS-CoV-2 target nucleic acids are NOT DETECTED.  The SARS-CoV-2 RNA is generally detectable in upper respiratory specimens during the acute phase of infection. The lowest concentration of SARS-CoV-2 viral copies this assay can detect is 138 copies/mL. A negative result does not preclude SARS-Cov-2 infection and should not be used as the sole basis for treatment or other patient management decisions. A negative result may occur with  improper specimen collection/handling, submission of specimen other than nasopharyngeal swab, presence of viral mutation(s) within the areas targeted by this assay, and inadequate number of viral copies(<138 copies/mL). A negative result must be combined with clinical observations, patient history, and epidemiological information. The expected result is Negative.  Fact Sheet for Patients:  EntrepreneurPulse.com.au  Fact Sheet for Healthcare Providers:  IncredibleEmployment.be  This test is no t yet approved or cleared by the [place] FDA and  has been authorized for detection and/or diagnosis of SARS-CoV-2 by FDA under an Emergency Use Authorization (EUA). This EUA will remain  in effect (meaning this test can be used) for the duration of the COVID-19 declaration under Section 564(b)(1) of the Act, 21 U.S.C.section 360bbb-3(b)(1), unless the authorization is terminated  or revoked sooner.       Influenza A by PCR NEGATIVE NEGATIVE Final   Influenza B by PCR NEGATIVE NEGATIVE Final    Comment: (NOTE) The Xpert Xpress SARS-CoV-2/FLU/RSV plus assay is intended as an aid in the diagnosis of influenza from Nasopharyngeal swab specimens and should not be used as a sole basis for treatment. Nasal washings and aspirates are unacceptable for Xpert  Xpress SARS-CoV-2/FLU/RSV testing.  Fact Sheet for Patients: EntrepreneurPulse.com.au  Fact Sheet for Healthcare Providers: IncredibleEmployment.be  This test is not yet approved or cleared by the [place] FDA and has been authorized for detection and/or diagnosis  of SARS-CoV-2 by FDA under an Emergency Use Authorization (EUA). This EUA will remain in effect (meaning this test can be used) for the duration of the COVID-19 declaration under Section 564(b)(1) of the Act, 21 U.S.C. section 360bbb-3(b)(1), unless the authorization is terminated or revoked.     Resp Syncytial Virus by PCR NEGATIVE NEGATIVE Final    Comment: (NOTE) Fact Sheet for Patients: EntrepreneurPulse.com.au  Fact Sheet for Healthcare Providers: IncredibleEmployment.be  This test is not yet approved or cleared by the [place] FDA and has been authorized for detection and/or diagnosis of SARS-CoV-2 by FDA under an Emergency Use Authorization (EUA). This EUA will remain in effect (meaning this test can be used) for the duration of the COVID-19 declaration under Section 564(b)(1) of the Act, 21 U.S.C. section 360bbb-3(b)(1), unless the authorization is terminated or revoked.  Performed at [facility], [address] [place], [place] [address]   MRSA Next Gen by PCR, Nasal     Status: Abnormal   Collection Time: 10/14/2022  5:48 PM   Specimen: Nasal Mucosa; Nasal Swab  Result Value Ref Range Status   MRSA by PCR Next Gen DETECTED (A) NOT DETECTED Final    Comment: CRITICAL RESULT CALLED TO, READ BACK BY AND VERIFIED WITH: Laurin Coder RN @ 2221 10/07/2022 [facility] (NOTE) The GeneXpert MRSA Assay (FDA approved for NASAL specimens only), is one component of a comprehensive MRSA colonization surveillance program. It is not intended to diagnose MRSA infection nor to guide or monitor treatment for MRSA infections. Test performance  is not FDA approved in patients less than 68 years old. Performed at [facility], [address] [place],  [address]     Radiology Studies: No results found.    Levonia Wolfley T. [place]  If 7PM-7AM, please contact night-coverage [email or web address] 10/04/2022, 11:32 AM

## 2022-10-05 DIAGNOSIS — Z7189 Other specified counseling: Secondary | ICD-10-CM | POA: Diagnosis not present

## 2022-10-05 DIAGNOSIS — I5033 Acute on chronic diastolic (congestive) heart failure: Secondary | ICD-10-CM | POA: Diagnosis not present

## 2022-10-05 DIAGNOSIS — R6520 Severe sepsis without septic shock: Secondary | ICD-10-CM

## 2022-10-05 DIAGNOSIS — L03116 Cellulitis of left lower limb: Secondary | ICD-10-CM | POA: Diagnosis not present

## 2022-10-05 DIAGNOSIS — A419 Sepsis, unspecified organism: Secondary | ICD-10-CM

## 2022-10-05 DIAGNOSIS — E1169 Type 2 diabetes mellitus with other specified complication: Secondary | ICD-10-CM | POA: Diagnosis not present

## 2022-10-05 DIAGNOSIS — J441 Chronic obstructive pulmonary disease with (acute) exacerbation: Secondary | ICD-10-CM | POA: Diagnosis not present

## 2022-10-05 DIAGNOSIS — R918 Other nonspecific abnormal finding of lung field: Secondary | ICD-10-CM | POA: Diagnosis not present

## 2022-10-05 DIAGNOSIS — J9601 Acute respiratory failure with hypoxia: Secondary | ICD-10-CM | POA: Diagnosis not present

## 2022-10-05 LAB — COMPREHENSIVE METABOLIC PANEL
ALT: 14 U/L (ref 0–44)
AST: 14 U/L — ABNORMAL LOW (ref 15–41)
Albumin: 3.1 g/dL — ABNORMAL LOW (ref 3.5–5.0)
Alkaline Phosphatase: 79 U/L (ref 38–126)
Anion gap: 7 (ref 5–15)
BUN: 45 mg/dL — ABNORMAL HIGH (ref 8–23)
CO2: 37 mmol/L — ABNORMAL HIGH (ref 22–32)
Calcium: 8.2 mg/dL — ABNORMAL LOW (ref 8.9–10.3)
Chloride: 96 mmol/L — ABNORMAL LOW (ref 98–111)
Creatinine, Ser: 0.88 mg/dL (ref 0.61–1.24)
GFR, Estimated: >60 mL/min (ref >60)
Glucose, Bld: 174 mg/dL — ABNORMAL HIGH (ref 70–99)
Potassium: 4.4 mmol/L (ref 3.5–5.1)
Sodium: 140 mmol/L (ref 135–145)
Total Bilirubin: 0.7 mg/dL (ref 0.3–1.2)
Total Protein: 5.9 g/dL — ABNORMAL LOW (ref 6.5–8.1)

## 2022-10-05 LAB — PHOSPHORUS: Phosphorus: 4.5 mg/dL (ref 2.5–4.6)

## 2022-10-05 LAB — CBC
HCT: 29.9 % — ABNORMAL LOW (ref 39.0–52.0)
Hemoglobin: 8.5 g/dL — ABNORMAL LOW (ref 13.0–17.0)
MCH: 29.6 pg (ref 26.0–34.0)
MCHC: 28.4 g/dL — ABNORMAL LOW (ref 30.0–36.0)
MCV: 104.2 fL — ABNORMAL HIGH (ref 80.0–100.0)
Platelets: 203 10*3/uL (ref 150–400)
RBC: 2.87 MIL/uL — ABNORMAL LOW (ref 4.22–5.81)
RDW: 14.9 % (ref 11.5–15.5)
WBC: 15.5 10*3/uL — ABNORMAL HIGH (ref 4.0–10.5)
nRBC: 0.1 % (ref 0.0–0.2)

## 2022-10-05 LAB — GLUCOSE, CAPILLARY
Glucose-Capillary: 134 mg/dL — ABNORMAL HIGH (ref 70–99)
Glucose-Capillary: 119 mg/dL — ABNORMAL HIGH (ref 70–99)
Glucose-Capillary: 165 mg/dL — ABNORMAL HIGH (ref 70–99)

## 2022-10-05 LAB — MAGNESIUM: Magnesium: 2.1 mg/dL (ref 1.7–2.4)

## 2022-10-05 MED ORDER — GLYCOPYRROLATE 1 MG PO TABS: 1.0000 mg | ORAL_TABLET | ORAL | Status: DC | PRN

## 2022-10-05 MED ORDER — HALOPERIDOL LACTATE 2 MG/ML PO CONC: 0.5000 mg | ORAL | Status: DC | PRN

## 2022-10-05 MED ORDER — POLYVINYL ALCOHOL 1.4 % OP SOLN: 1.0000 [drp] | Freq: Four times a day (QID) | OPHTHALMIC | Status: DC | PRN

## 2022-10-05 MED ORDER — HALOPERIDOL LACTATE 5 MG/ML IJ SOLN: 0.5000 mg | INTRAMUSCULAR | Status: DC | PRN

## 2022-10-05 MED ORDER — INSULIN ASPART 100 UNIT/ML IJ SOLN: 3.0000 [IU] | Freq: Three times a day (TID) | INTRAMUSCULAR | Status: DC

## 2022-10-05 MED ORDER — MORPHINE SULFATE (CONCENTRATE) 10 MG/0.5ML PO SOLN
5.0000 mg | ORAL | Status: DC | PRN
  Administered 2022-10-05: 5 mg via SUBLINGUAL

## 2022-10-05 MED ORDER — ACETAMINOPHEN 650 MG RE SUPP: 650.0000 mg | Freq: Four times a day (QID) | RECTAL | Status: DC | PRN

## 2022-10-05 MED ORDER — ACETAMINOPHEN 325 MG PO TABS: 650.0000 mg | ORAL_TABLET | Freq: Four times a day (QID) | ORAL | Status: DC | PRN

## 2022-10-05 MED ORDER — GLYCOPYRROLATE 0.2 MG/ML IJ SOLN: 0.2000 mg | INTRAMUSCULAR | Status: DC | PRN

## 2022-10-05 MED ORDER — INSULIN ASPART 100 UNIT/ML IJ SOLN: 0.0000 [IU] | Freq: Three times a day (TID) | INTRAMUSCULAR | Status: DC

## 2022-10-05 MED ORDER — LOSARTAN POTASSIUM 25 MG PO TABS: 12.5000 mg | ORAL_TABLET | Freq: Every day | ORAL | Status: DC

## 2022-10-05 MED ORDER — HALOPERIDOL 0.5 MG PO TABS: 0.5000 mg | ORAL_TABLET | ORAL | Status: DC | PRN

## 2022-10-05 MED ORDER — INSULIN ASPART 100 UNIT/ML IJ SOLN: 0.0000 [IU] | Freq: Every day | INTRAMUSCULAR | Status: DC

## 2022-10-05 MED ORDER — MORPHINE SULFATE (CONCENTRATE) 10 MG/0.5ML PO SOLN
5.0000 mg | ORAL | Status: DC | PRN
  Filled 2022-10-05: qty 0.5

## 2022-10-06 DIAGNOSIS — Z515 Encounter for palliative care: Secondary | ICD-10-CM

## 2022-10-06 DIAGNOSIS — R451 Restlessness and agitation: Secondary | ICD-10-CM | POA: Insufficient documentation

## 2022-10-06 DIAGNOSIS — D62 Acute posthemorrhagic anemia: Secondary | ICD-10-CM | POA: Insufficient documentation

## 2022-10-06 DIAGNOSIS — K922 Gastrointestinal hemorrhage, unspecified: Secondary | ICD-10-CM | POA: Insufficient documentation

## 2022-10-06 DIAGNOSIS — A419 Sepsis, unspecified organism: Secondary | ICD-10-CM

## 2022-10-18 ENCOUNTER — Ambulatory Visit: Payer: Medicare HMO | Admitting: Pulmonary Disease

## 2022-10-26 ENCOUNTER — Ambulatory Visit: Payer: Medicare HMO | Admitting: Cardiovascular Disease

## 2022-10-26 NOTE — Progress Notes (Signed)
Patient moved to room 110 by bed with Marcene Brawn, NT. Alert with no distress noted when leaving ICU. Patient's daughter and granddaughter went to new room prior to moving patient.

## 2022-10-26 NOTE — Inpatient Diabetes Management (Signed)
Inpatient Diabetes Program Recommendations  AACE/ADA: New Consensus Statement on Inpatient Glycemic Control   Target Ranges:  Prepandial:   less than 140 mg/dL      Peak postprandial:   less than 180 mg/dL (1-2 hours)      Critically ill patients:  140 - 180 mg/dL    Latest Reference Range & Units 10/04/22 07:23 10/04/22 11:03 10/04/22 16:13 10-18-22 07:23  Glucose-Capillary 70 - 99 mg/dL 199 (H) 201 (H) 305 (H) 134 (H)   Review of Glycemic Control  Diabetes history: DM2 Outpatient Diabetes medications: Jardiance 10 mg daily, Metformin 500 mg BID, Glipizide 5 mg BID Current orders for Inpatient glycemic control: Semglee 10 units BID, Novolog 0-20 units TID with meals, Novolog 0-5 units QHS, Novolog 5 units TID with meals  Inpatient Diabetes Program Recommendations:    Insulin: Noted steroids no longer ordered. May want to consider decreasing Semglee to 10 units daily (to start 2/10 since already given today) and meal coverage to Novolog 3 units TID with meals.  Thanks, Barnie Alderman, RN, MSN, [place] Diabetes Coordinator Inpatient Diabetes Program [phone] (Team Pager from 8am to [place])

## 2022-10-26 NOTE — Progress Notes (Signed)
Daily Progress Note   Patient Name: Julian Sparks       Date: October 10, 2022 DOB: 1945-09-01  Age: 77 y.o. MRN#: [id number] Attending Physician: Mercy Riding, MD Primary Care Physician: [name], MD Admit Date: 10/04/2022  Reason for Consultation/Follow-up: Establishing goals of care  Patient Profile/HPI:  77 y.o. male  with past medical history of stage III severe COPD on Trelegy vent, chronic respiratory failure with hypoxia and hypercapnia, RLS, OSA, chronic diastolic CHF, type II DM, and HTN  admitted on 10/09/2022 with SOB. Pt is being treated for AECOPD, possible PNA, and CHF exacerbation. Pt has been on/off bipap with severe SOB but is currently on [place]. CT angio chest on admission identified new RLL mass concerning for primary bronchogenic carcinoma, emphysema, possibly pulmonary hypertension. Admission complicated by bloody bowel movements with drop in Hgb to 7.6. CT AP showed bulky retroperitoneal lymphoid mass and L adrenal mass concerning for metastatic disease. Palliative consulted for [place]. Referred for home hospice services yesterday.   Subjective: Contacted by RN to speak with family to further clarify [place].  Met with family.  Discussed options of continued hospitalization and ongoing interventions including bipap, IV fluids, medications aimed at treating illnesses and eventual home with hospice vs transition to full comfort measures only. Discussed transition to comfort measures only which includes stopping IV fluids, antibiotics, labs and providing symptom management for SOB, anxiety, nausea, vomiting, and other symptoms of dying.  Family clearly stated they wanted comfort measures only.    Physical Exam Vitals and nursing note reviewed.  Pulmonary:     Effort: Pulmonary effort is  normal.  Neurological:     Mental Status: He is alert.             Vital Signs: BP (!) 99/50   Pulse 94   Temp 97.8 F (36.6 C) (Oral)   Resp (!) 27   Ht 5\' 6"  (1.676 m)   Wt 90 kg   SpO2 91%   BMI 32.02 kg/m  SpO2: SpO2: 91 % O2 Device: O2 Device: Nasal Cannula O2 Flow Rate: O2 Flow Rate (L/min): 6 L/min  Intake/output summary:  Intake/Output Summary (Last 24 hours) at 10-Oct-2022 1442 Last data filed at 10/10/22 1005 Gross per 24 hour  Intake 240 ml  Output 900 ml  Net -660 ml  LBM: Last BM Date : 10/03/22 Baseline Weight: Weight: 93.1 kg Most recent weight: Weight: 90 kg         Patient Active Problem List   Diagnosis Date Noted   Hypercapnia 10/04/2022   Hypoxia 10/04/2022   Palliative care by specialist 10/04/2022   Right lower lobe lung mass 10/03/2022   DNR (do not resuscitate) 10/03/2022   Abdominal pain 10/03/2022   Acute hypoxic respiratory failure ([place]) 10/10/2022   Cellulitis of left leg 10/18/2022   Pneumonia of both lungs due to infectious organism    COPD exacerbation (HCC)    OSA (obstructive sleep apnea) 04/11/2021   Ulcer of left foot ([place]) 03/08/2021   Paroxysmal atrial tachycardia    Metabolic alkalosis 23/36/1224   Toxic metabolic encephalopathy 49/75/3005   Lymphoma, 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/04/2021   Acute cor pulmonale (HCC)    Acute on chronic congestive heart failure (HCC)    Acute on chronic diastolic CHF (congestive heart failure) ([place]) 01/24/2021   Type 2 diabetes mellitus with hyperlipidemia ([place]) 01/24/2021   Acute on chronic respiratory failure ([place]) 01/24/2021   Hyperkalemia 01/24/2021   Obesity (BMI 30-39.9) 01/24/2021   Shortness of breath 10/26/2020   Essential hypertension 07/13/2020   Cellulitis of right lower extremity 07/13/2020   Need for influenza vaccination 05/11/2020   Chronic respiratory failure with hypoxia ([place]) 04/27/2020   Bronchitis 04/27/2020   Pleurodynia 04/27/2020   Obesity (BMI 30.0-34.9)  02/17/2020   Diabetes 1.5, managed as type 2 ([place]) 02/17/2020   Adhesive capsulitis of both shoulders 02/17/2020   Edema 07/19/2019   Leg swelling 07/19/2019   Diminished pulses in lower extremity 07/19/2019   Bursitis of shoulder 04/03/2019   Respiratory failure ([place]) 02/04/2019   Full thickness rotator cuff tear 05/23/2016    Palliative Care Assessment & Plan    Assessment/Recommendations/Plan  Plan of care transitioned to full comfort measures only, comfort orders placed TOC has been consulted for Hospice referral On discharge, would recommend scripts for: - Morphine Concentrate 10mg /0.49ml: 5mg  (0.66ml) sublingual every 1 hour as needed for pain or shortness of breath: Disp 63ml - Lorazepam 2mg /ml concentrated solution: 1mg  (0.38ml) sublingual every 4 hours as needed for anxiety: Disp 20ml - Haldol 2mg /ml solution: 0.5mg  (0.55ml) sublingual every 4 hours as needed for agitation or nausea: Disp 41ml     Code Status: DNR  Prognosis:  Unable to determine  Discharge Planning: Home with Hospice  Care plan was discussed with family and care team.   Thank you for allowing the Palliative Medicine Team to assist in the care of this patient.   Greater than 50%  of this time was spent counseling and coordinating care related to the above assessment and plan.  Mariana Kaufman, AGNP-C Palliative Medicine   Please contact Palliative Medicine Team phone at [phone] for questions and concerns.

## 2022-10-26 NOTE — Progress Notes (Signed)
Dr. Rockey Situ gave order to discontinue carboxyhemoglobin.

## 2022-10-26 NOTE — Progress Notes (Signed)
PT Cancellation Note  Patient Details Name: Julian Sparks MRN: [id number] DOB: 1946-03-29   Cancelled Treatment:    Reason Eval/Treat Not Completed: Other (comment).  PT consult cancelled.  Per chart plan for pt to discharge home with hospice.  Leitha Bleak, PT 10-25-2022, 2:48 PM

## 2022-10-26 NOTE — Progress Notes (Signed)
SLP Cancellation Note  Patient Details Name: Julian Sparks MRN: [id number] DOB: [birth year]   Cancelled treatment:       Reason Eval/Treat Not Completed:  (chart reviewed; consulted NSG, Dtr, then met w/ pt in room. Pt was drinking Pepsi.)  Pt has been eating/drinking; tolerated bacon/eggs this morning per NSG report w/ No difficulty. Dtr present stated pt "just did not like the pancakes the other day, that's why he said he couldn't swallow". Instead, he has eaten sausage/gravy biscuits and a fish sandwich w/ her w/ No difficulty. Dtr stated she had no concerns re: pt's swallowing.  Per chart notes/TOC/Dtr, pt is "eating fine".   Recommend continue current diet w/ tray setup/prep for support focusing on his [place] -- Dtr stated he "likes to eat fish sandwiches". Per Dtr, pt is discharging w/ Hospice services, possibly home w/ the support. Recommend general aspiration precautions and support at meals; Pills in Puree as needed for ease/safety of swallowing. NSG to reconsult if any new needs while admitted. NSG agreed. Dtr and pt agreed.       Orinda Kenner, MS, CCC-SLP Speech Language Pathologist Rehab Services; [place] [phone] (ascom) Gitty Osterlund 10/15/2022, 2:36 PM

## 2022-10-26 NOTE — Death Summary Note (Signed)
DEATH SUMMARY   Patient Details  Name: Julian Sparks MRN: [id number] DOB: 11/28/1945 [id number], Julian Shove, MD Admission/Discharge Information   Admit Date:  10/25/2022  Date of Death: Date of Death: 10-31-2022  Time of Death: Time of Death: 1800  Length of Stay: 6   Principle Cause of death: Acute on chronic respiratory failure with hypoxia and hypercapnia  Hospital Diagnoses: Principal Problem:   Severe sepsis ([place]) Active Problems:   Obesity (BMI 30.0-34.9)   Diabetes 1.5, managed as type 2 ([place])   Acute on chronic respiratory failure with hypoxia and hypercapnia (HCC)   Acute on chronic diastolic CHF (congestive heart failure) (HCC)   Type 2 diabetes mellitus with hyperlipidemia (HCC)   Toxic metabolic encephalopathy   Paroxysmal atrial tachycardia   OSA (obstructive sleep apnea)   Bacterial pneumonia   COPD exacerbation (HCC)   Cellulitis of left leg   Right lower lobe lung mass   DNR (do not resuscitate)   Abdominal pain   Palliative care by specialist   End of life care   Agitation   Acute blood loss anemia   GIB (gastrointestinal bleeding)   Hospital Course: 77 year old M with PMH of diastolic CHF, COPD, chronic hypoxic RF on 2 to 4 L by [place], NIDDM-2, OSA and RLS who  was admitted to the stepdown unit on the hospitalist service with acute hypoxic and hypercapnic respiratory failure likely due to combination of exacerbation of COPD as well as acute on chronic diastolic congestive heart failure.  CT angio chest negative for PE but new RLL mass concerning for primary bronchogenic carcinoma, thoracic and abdominal lymphadenopathy, emphysema, possible pulmonary hypertension and debris's within the esophagus suggesting dysmotility of GERD.  There was also concern for pneumonia and bilateral lower extremity cellulitis for which she was started on antibiotics.  He developed severe agitation soon after admission to the critical care unit, was seen by pulmonary critical care due to  high risk of intubation.  Patient did not require intubation but BiPAP.  He required Precedex for agitation.    Patient was weaned off BiPAP and Precedex on 2/5 and transferred to [facility] hospitalist service.   He had an episode of rectal bleed with bloody bowel movements on 2/6.  Hgb reached nadir at 7.6 and remained stable.  CT abdomen and pelvis showed bulky retroperitoneal lymphoid mass and left adrenal gland mass concerning for metastatic disease. After meeting with palliative medicine, patient and family decided not pursue aggressive measures, biopsy, or treatment plan for lung mass and chose full comfort care on 2022-10-31. Patient passed away the same night with family members at bedside.    Procedures: None  Consultations: Pulmonology, cardiology and palliative medicine  The results of significant diagnostics from this hospitalization (including imaging, microbiology, ancillary and laboratory) are listed below for reference.   Significant Diagnostic Studies: CT ABDOMEN PELVIS W CONTRAST  Result Date: 10/01/2022 CLINICAL DATA:  Lung nodule. * Tracking Code: BO * EXAM: CT ABDOMEN AND PELVIS WITH CONTRAST TECHNIQUE: Multidetector CT imaging of the abdomen and pelvis was performed using the standard protocol following bolus administration of intravenous contrast. RADIATION DOSE REDUCTION: This exam was performed according to the departmental dose-optimization program which includes automated exposure control, adjustment of the mA and/or kV according to patient size and/or use of iterative reconstruction technique. CONTRAST:  14mL OMNIPAQUE IOHEXOL 300 MG/ML  SOLN COMPARISON:  CT chest 1 day prior, CT abdomen 02/03/2021 FINDINGS: Lower chest: Lung bases are clear. Hepatobiliary: No focal hepatic lesion.  Gallbladder  normal. Pancreas: Pancreas is normal. No ductal dilatation. No pancreatic inflammation. Spleen: Normal spleen Adrenals/urinary tract: LEFT adrenal gland is enlarged 16 mm (image 21/series  2). Enlargement new from CT 02/03/2021. Kidneys, ureters and bladder normal. Stomach/Bowel: Stomach, small bowel, appendix, and cecum are normal. Multiple diverticula of the descending colon and sigmoid colon without acute inflammation. Vascular/Lymphatic: Bulky periaortic adenopathy. Lymphoid mass measures 14.5 by 8.7 cm (image 40/series 2) compared to 14.5 by 7.0 cm visually the mass appears slightly larger. Periportal lymph node measures 4.0 cm short axis compared to 3.6 cm. Single nodal mass ventral to the aorta measures 5.5 cm compared to 4.7 cm. No pelvic lymphadenopathy. Reproductive: Prostate normal Other: No free fluid. Musculoskeletal: No aggressive osseous lesion. IMPRESSION: 1. New enlargement of the LEFT adrenal gland concerning for metastasis. 2. Bulky retroperitoneal periaortic lymphoid mass slightly increased in volume compared to CT 02/03/2021. 3. Bibasilar atelectasis and pleural effusions. Electronically Signed   By: Suzy Bouchard M.D.   On: 10/01/2022 16:56   ECHOCARDIOGRAM COMPLETE  Result Date: 09/30/2022    ECHOCARDIOGRAM REPORT   Patient Name:   Julian Sparks Date of Exam: 09/30/2022 Medical Rec #:  [id number]     Height:       66.0 in Accession #:    [id number]    Weight:       203.7 lb Date of Birth:  1945-12-10     BSA:          2.015 m Patient Age:    77 years      BP:           111/65 mmHg Patient Gender: M             HR:           76 bpm. Exam Location:  [facility] Procedure: 2D Echo Indications:     CHF I50.31  History:         Patient has prior history of Echocardiogram examinations.  Sonographer:     Wayland Salinas RDCS Referring Phys:  [id number] MIR Jerilynn Mages [facility] Diagnosing Phys: Buford Dresser MD  Sonographer Comments: Patient is obese, no subcostal window and Technically difficult study due to poor echo windows. Image acquisition challenging due to uncooperative patient, Image acquisition challenging due to respiratory motion and Image acquisition challenging due to patient body  habitus. Exam ended due to patient's respiratory distress and patient unable to continue. IMPRESSIONS  1. Left ventricular ejection fraction, by estimation, is 50 to 55%. The left ventricle has low normal function. The left ventricle has no regional wall motion abnormalities. There is mild left ventricular hypertrophy. Left ventricular diastolic function  could not be evaluated. There is the interventricular septum is flattened in systole, consistent with right ventricular pressure overload.  2. Right ventricular systolic function is moderately reduced. The right ventricular size is severely enlarged. There is moderately elevated pulmonary artery systolic pressure. The estimated right ventricular systolic pressure is 02.5 mmHg.  3. The mitral valve is grossly normal. Trivial mitral valve regurgitation. No evidence of mitral stenosis.  4. The aortic valve is tricuspid. There is mild calcification of the aortic valve. There is mild thickening of the aortic valve. Aortic valve regurgitation is not visualized. Aortic valve sclerosis is present, with no evidence of aortic valve stenosis. Comparison(s): No significant change from prior study. Conclusion(s)/Recommendation(s): Study stopped early due to patient's clinical condition; limited images. With estimated RAP (as IVC not visualized), moderate pulmonary hypertension with RV dilation and reduced RV function. FINDINGS  Left Ventricle: Left ventricular ejection fraction, by estimation, is 50 to 55%. The left ventricle has low normal function. The left ventricle has no regional wall motion abnormalities. The left ventricular internal cavity size was normal in size. There is mild left ventricular hypertrophy. The interventricular septum is flattened in systole, consistent with right ventricular pressure overload. Left ventricular diastolic function could not be evaluated. Right Ventricle: The right ventricular size is severely enlarged. Right vetricular wall thickness was  not well visualized. Right ventricular systolic function is moderately reduced. There is moderately elevated pulmonary artery systolic pressure. The tricuspid regurgitant velocity is 3.14 m/s, and with an assumed right atrial pressure of 8 mmHg, the estimated right ventricular systolic pressure is 27.0 mmHg. Left Atrium: Left atrial size was normal in size. Right Atrium: Right atrial size was not well visualized. Pericardium: There is no evidence of pericardial effusion. Mitral Valve: The mitral valve is grossly normal. Trivial mitral valve regurgitation. No evidence of mitral valve stenosis. Tricuspid Valve: The tricuspid valve is normal in structure. Tricuspid valve regurgitation is trivial. No evidence of tricuspid stenosis. Aortic Valve: The aortic valve is tricuspid. There is mild calcification of the aortic valve. There is mild thickening of the aortic valve. Aortic valve regurgitation is not visualized. Aortic valve sclerosis is present, with no evidence of aortic valve stenosis. Pulmonic Valve: The pulmonic valve was not well visualized. Pulmonic valve regurgitation is trivial. No evidence of pulmonic stenosis. Aorta: The aortic root and ascending aorta are structurally normal, with no evidence of dilitation. IAS/Shunts: The interatrial septum was not well visualized. Additional Comments: There is a small pleural effusion in the left lateral region.  LEFT VENTRICLE PLAX 2D LVIDd:         5.60 cm   Diastology LVIDs:         3.90 cm   LV e' medial:   7.83 cm/s LV PW:         1.30 cm   LV E/e' medial: 10.3 LV IVS:        1.30 cm LVOT diam:     2.20 cm LVOT Area:     3.80 cm  LEFT ATRIUM           Index LA diam:      3.90 cm 1.94 cm/m LA Vol (A4C): 36.7 ml 18.21 ml/m                        PULMONIC VALVE AORTA                 PV Vmax:        0.96 m/s Ao Root diam: 3.50 cm PV Peak grad:   3.7 mmHg                       RVOT Peak grad: 0 mmHg  MITRAL VALVE               TRICUSPID VALVE MV Area (PHT): 3.56 cm     TR Peak grad:   39.4 mmHg MV Decel Time: 213 msec    TR Vmax:        314.00 cm/s MV E velocity: 80.70 cm/s MV A velocity: 78.10 cm/s  SHUNTS MV E/A ratio:  1.03        Systemic Diam: 2.20 cm Buford Dresser MD Electronically signed by Buford Dresser MD Signature Date/Time: 09/30/2022/1:39:59 PM    Final    CT Angio Chest Pulmonary Embolism (PE) W  or WO Contrast  Result Date: 09/30/2022 CLINICAL DATA:  Hypoxic respiratory failure. Evaluate for pulmonary embolism. History of chronic lymphocytic leukemia/lymphoma. * Tracking Code: BO * EXAM: CT ANGIOGRAPHY CHEST WITH CONTRAST TECHNIQUE: Multidetector CT imaging of the chest was performed using the standard protocol during bolus administration of intravenous contrast. Multiplanar CT image reconstructions and MIPs were obtained to evaluate the vascular anatomy. RADIATION DOSE REDUCTION: This exam was performed according to the departmental dose-optimization program which includes automated exposure control, adjustment of the mA and/or kV according to patient size and/or use of iterative reconstruction technique. CONTRAST:  34mL OMNIPAQUE IOHEXOL 350 MG/ML SOLN COMPARISON:  Plain film 1 day prior.  CT chest 04/13/2021. FINDINGS: Cardiovascular: The quality of this exam for evaluation of pulmonary embolism is good. No evidence of pulmonary embolism. Aortic atherosclerosis. Tortuous thoracic aorta. Moderate cardiomegaly with lipomatous hypertrophy of the interatrial septum. Lad and right coronary artery calcification. Pulmonary artery enlargement, outflow tract 3.1 cm. Mediastinum/Nodes: 1.4 cm node within the azygoesophageal recess measures 1.6 cm on the prior exam. Right infrahilar node of 1.2 cm on 77/4 is similar to on the prior. Right hilar adenopathy is also mild and not significantly changed. Irregularity along the esophageal lumen is eccentric right including on 26/4 and 44/4, most likely related to enteric contents. Lungs/Pleura: Small right pleural  effusion is decreased with minimal loculation superiorly and laterally including on 43/4. Advanced centrilobular emphysema. Right apical scarring. Recurrent or chronic right lower lobe dependent atelectasis. Development of a superior segment right lower lobe lung mass which measures 3.0 x 3.1 cm on 60/5. 3.5 cm craniocaudal on 123/7 Areas of subsegmental atelectasis or scar within the left upper and left lower lobes. Upper Abdomen: Normal imaged portions of the liver, spleen, stomach. Upper abdominal presumed nodal mass of 7.6 cm on 137/4 is grossly similar to on the prior. Left adrenal or abdominal retroperitoneal mass of 4.5 cm on 137/4 is incompletely imaged but grossly similar previously. Musculoskeletal: Osteopenia. Remote left rib fractures. T5 through T7 mild compression deformities. The T7 compression deformity is similar and the T5-6 compression deformities are new since 2022. Review of the MIP images confirms the above findings. IMPRESSION: 1.  No evidence of pulmonary embolism. 2. New superior segment right lower lobe lung mass, highly suspicious for primary bronchogenic carcinoma. Consider multidisciplinary thoracic oncology consultation for eventual PET and tissue sampling. 3. Tiny right pleural effusion with minimal loculation. 4. Relatively similar thoracic adenopathy which is most likely related to the history of leukemia/lymphoma. Marked upper abdominal adenopathy is suboptimally evaluated but grossly similar to 2022. 5. Aortic atherosclerosis (ICD10-I70.0), coronary artery atherosclerosis and emphysema (ICD10-J43.9). Pulmonary artery enlargement suggests pulmonary arterial hypertension. 6. Probable debris within the esophagus, suggesting dysmotility and/or gastroesophageal reflux. If there are clinical symptoms to suggest underlying esophageal mass, recommend endoscopy. These results will be called to the ordering clinician or representative by the Radiologist Assistant, and communication  documented in the PACS or [employer]. Electronically Signed   By: Abigail Miyamoto M.D.   On: 09/30/2022 10:40   US Venous Img Lower Bilateral (DVT)  Result Date: 10/17/2022 CLINICAL DATA:  Swelling EXAM: BILATERAL LOWER EXTREMITY VENOUS DOPPLER ULTRASOUND TECHNIQUE: Gray-scale sonography with compression, as well as color and duplex ultrasound, were performed to evaluate the deep venous system(s) from the level of the common femoral vein through the popliteal and proximal calf veins. COMPARISON:  None Available. FINDINGS: VENOUS Normal compressibility of the common femoral, superficial femoral, and popliteal veins, as well as the visualized calf  veins. Visualized portions of profunda femoral vein and great saphenous vein unremarkable. No filling defects to suggest DVT on grayscale or color Doppler imaging. Doppler waveforms show normal direction of venous flow, normal respiratory plasticity and response to augmentation. Limited views of the contralateral common femoral vein are unremarkable. OTHER None. Limitations: none IMPRESSION: Negative. Electronically Signed   By: Rolm Baptise M.D.   On: 10/18/2022 21:06   DG Chest 2 View  Result Date: 10/13/2022 CLINICAL DATA:  Shortness of breath. EXAM: CHEST - 2 VIEW COMPARISON:  February 19, 2022 FINDINGS: Tortuosity and calcific atherosclerotic disease of the aorta. Cardiomediastinal silhouette is normal. Mediastinal contours appear intact. Streaky airspace opacities in the lower lobes. Fissural thickening bilaterally. No large pleural effusions are seen. Osseous structures are without acute abnormality. Soft tissues are grossly normal. IMPRESSION: Streaky airspace opacities in bilateral lower lobes may represent asymmetric pulmonary edema or atypical pneumonia. Bilateral fissural thickening. Electronically Signed   By: Fidela Salisbury M.D.   On: 09/28/2022 15:52    Microbiology: Recent Results (from the past 240 hour(s))  Resp panel by RT-PCR (RSV, Flu A&B,  Covid)     Status: None   Collection Time: 09/27/2022  3:42 PM  Result Value Ref Range Status   SARS Coronavirus 2 by RT PCR NEGATIVE NEGATIVE Final    Comment: (NOTE) SARS-CoV-2 target nucleic acids are NOT DETECTED.  The SARS-CoV-2 RNA is generally detectable in upper respiratory specimens during the acute phase of infection. The lowest concentration of SARS-CoV-2 viral copies this assay can detect is 138 copies/mL. A negative result does not preclude SARS-Cov-2 infection and should not be used as the sole basis for treatment or other patient management decisions. A negative result may occur with  improper specimen collection/handling, submission of specimen other than nasopharyngeal swab, presence of viral mutation(s) within the areas targeted by this assay, and inadequate number of viral copies(<138 copies/mL). A negative result must be combined with clinical observations, patient history, and epidemiological information. The expected result is Negative.  Fact Sheet for Patients:  EntrepreneurPulse.com.au  Fact Sheet for Healthcare Providers:  IncredibleEmployment.be  This test is no t yet approved or cleared by the [place] FDA and  has been authorized for detection and/or diagnosis of SARS-CoV-2 by FDA under an Emergency Use Authorization (EUA). This EUA will remain  in effect (meaning this test can be used) for the duration of the COVID-19 declaration under Section 564(b)(1) of the Act, 21 U.S.C.section 360bbb-3(b)(1), unless the authorization is terminated  or revoked sooner.       Influenza A by PCR NEGATIVE NEGATIVE Final   Influenza B by PCR NEGATIVE NEGATIVE Final    Comment: (NOTE) The Xpert Xpress SARS-CoV-2/FLU/RSV plus assay is intended as an aid in the diagnosis of influenza from Nasopharyngeal swab specimens and should not be used as a sole basis for treatment. Nasal washings and aspirates are unacceptable for Xpert  Xpress SARS-CoV-2/FLU/RSV testing.  Fact Sheet for Patients: EntrepreneurPulse.com.au  Fact Sheet for Healthcare Providers: IncredibleEmployment.be  This test is not yet approved or cleared by the [place] FDA and has been authorized for detection and/or diagnosis of SARS-CoV-2 by FDA under an Emergency Use Authorization (EUA). This EUA will remain in effect (meaning this test can be used) for the duration of the COVID-19 declaration under Section 564(b)(1) of the Act, 21 U.S.C. section 360bbb-3(b)(1), unless the authorization is terminated or revoked.     Resp Syncytial Virus by PCR NEGATIVE NEGATIVE Final    Comment: (NOTE)  Fact Sheet for Patients: EntrepreneurPulse.com.au  Fact Sheet for Healthcare Providers: IncredibleEmployment.be  This test is not yet approved or cleared by the [place] FDA and has been authorized for detection and/or diagnosis of SARS-CoV-2 by FDA under an Emergency Use Authorization (EUA). This EUA will remain in effect (meaning this test can be used) for the duration of the COVID-19 declaration under Section 564(b)(1) of the Act, 21 U.S.C. section 360bbb-3(b)(1), unless the authorization is terminated or revoked.  Performed at [facility], [address] [place], [place] [address]   MRSA Next Gen by PCR, Nasal     Status: Abnormal   Collection Time: 10/01/2022  5:48 PM   Specimen: Nasal Mucosa; Nasal Swab  Result Value Ref Range Status   MRSA by PCR Next Gen DETECTED (A) NOT DETECTED Final    Comment: CRITICAL RESULT CALLED TO, READ BACK BY AND VERIFIED WITH: Laurin Coder RN @ 2221 09/30/2022 [facility] (NOTE) The GeneXpert MRSA Assay (FDA approved for NASAL specimens only), is one component of a comprehensive MRSA colonization surveillance program. It is not intended to diagnose MRSA infection nor to guide or monitor treatment for MRSA infections. Test performance  is not FDA approved in patients less than 71 years old. Performed at [facility], [address], [place], [place] [address]     Time spent: 35 minutes  Signed: Mercy Riding, MD 2022-10-20

## 2022-10-26 NOTE — Progress Notes (Signed)
PROGRESS NOTE  Julian CAHALAN UUV:[id number] DOB: 1945-10-09   PCP: [name], MD  Patient is from: Home  DOA: 10/13/2022 LOS: 6  Chief complaints Chief Complaint  Patient presents with   Shortness of Breath     Brief Narrative / Interim history: 77 year old M with PMH of diastolic CHF, COPD, chronic hypoxic RF on 2 to 4 L by [place], NIDDM-2, OSA and RLS who  was admitted to the stepdown unit on the hospitalist service with acute hypoxic and hypercapnic respiratory failure likely due to combination of exacerbation of COPD as well as acute on chronic diastolic congestive heart failure.  CT angio chest negative for PE but new RLL mass concerning for primary bronchogenic carcinoma, thoracic and abdominal lymphadenopathy, emphysema, possible pulmonary hypertension and debris's within the esophagus suggesting dysmotility of GERD.  There was also concern for pneumonia and bilateral lower extremity cellulitis for which she was started on antibiotics.  He developed severe agitation soon after admission to the critical care unit, was seen by pulmonary critical care due to high risk of intubation.  Patient did not require intubation but BiPAP.  He required Precedex for agitation.   Patient was weaned off BiPAP and Precedex on 2/5 and transferred to [facility] hospitalist service.   He had an episode of rectal bleed with bloody bowel movements on 2/6.  Hgb reached nadir at 7.6 and remained stable.  CT abdomen and pelvis showed bulky retroperitoneal lymphoid mass and left adrenal gland mass concerning for metastatic disease.  Patient and family  do not wish to pursue aggressive measures, biopsy, or treatment plan for lung mass.  Plan for discharge home with home hospice.  TOC consulted for hospice referral.  Palliative medicine following.  Subjective: Seen and examined earlier this morning.  No major events overnight of this morning.  He says he has been up on a chair since earlier this morning.  He reports  difficulty breathing when he lies down in bed.  Also reports LUQ pain.   Objective: Vitals:   2022-10-27 1000 Oct 27, 2022 1100 2022-10-27 1200 October 27, 2022 1300  BP: 110/79 (!) 98/55 (!) 102/49 (!) 99/50  Pulse: 95 94  94  Resp: 18  20 (!) 27  Temp:   97.8 F (36.6 C)   TempSrc:   Oral   SpO2: 100% 100% 92% 91%  Weight:      Height:        Examination:  GENERAL: No apparent distress.  Nontoxic. HEENT: MMM.  Vision and hearing grossly intact.  NECK: Supple.  No apparent JVD.  RESP:  No IWOB.  Fair aeration bilaterally.  Rhonchi bilaterally. CVS:  RRR. Heart sounds normal.  ABD/GI/GU: BS+. Abd soft, NTND.  MSK/EXT:   No apparent deformity. Moves extremities.  1+ edema in all extremities. SKIN: Scattered erythematous macules/patches on the back. NEURO: Awake and alert. Oriented appropriately.  No apparent focal neuro deficit. PSYCH: Calm. Normal affect.   Procedures:  None  Microbiology summarized: [id number], influenza and RSV PCR nonreactive. MRSA PCR screen positive.  Assessment and plan: Principal Problem:   Acute hypoxic respiratory failure (HCC) Active Problems:   Obesity (BMI 30.0-34.9)   Diabetes 1.5, managed as type 2 ([place])   Chronic respiratory failure with hypoxia (HCC)   Acute on chronic diastolic CHF (congestive heart failure) (HCC)   Type 2 diabetes mellitus with hyperlipidemia (HCC)   Acute on chronic congestive heart failure (HCC)   Toxic metabolic encephalopathy   Paroxysmal atrial tachycardia   OSA (obstructive sleep apnea)  COPD exacerbation ([place])   Cellulitis of left leg   Right lower lobe lung mass   DNR (do not resuscitate)   Abdominal pain   Hypercapnia   Hypoxia   Palliative care by specialist  Severe sepsis due to possible bacterial pneumonia and bilateral lower extremity cellulitis: POA.  Had tachycardia, leukocytosis and respiratory failure.  However, lactic acid and Pro-Cal negative.  Completed 5 days of CTX and doxycycline and 2/7.  Leukocytosis  slightly up today likely from steroid. -Discontinued steroid.  Acute hypoxic and hypercarbic respiratory failure: Multifactorial including COPD, pneumonia, CHF and abdominal pain.  CT angio chest negative for PE but new RLL lung mass suspicious for bronchogenic carcinoma, relatively similar thoracic adenopathy and marked upper abdominal adenopathy and probable debris's with esophagus suggesting dysmotility/GERD. -Continue scheduled and as needed nebulizers. -Mucolytic's and antitussive. -Aspiration precaution -Incentive spirometry, OOB/PT/OT -Manage CHF as below.  Acute on chronic diastolic CHF: BNP is elevated to 325 but improved to 136.Marland Kitchen  TTE with LVEF of 50 to 55%, RV pressure overload and RVSP of 47.4 mmHg.  Diuresed with IV Lasix and transition to p.o. torsemide.  Still with edema in all extremities.  Soft blood pressure this morning. -Appreciate help by cardiology-on Demadex, losartan and Toprol-XL.  Added holding parameters. -Monitor intake and output, renal functions and electrolytes.  COPD with acute exacerbation: -Management as above.  Elevated troponin/demand ischemia: TTE as above.  No chest pain. -Defer to cardiology  Possible bronchogenic carcinoma/thoracic and abdominal LAD: Patient and family not interested in aggressive measures, biopsy or chemotherapy.  Uncontrolled NIDDM-2 with hyperglycemia: A1c 10.1%.  Expect hyperglycemia to improve now off steroid. Recent Labs  Lab 10/04/22 0723 10/04/22 1103 10/04/22 1613 2022-10-06 0723 10-06-2022 1135  GLUCAP 199* 201* 305* 134* 119*  -Continue Semglee 10 units twice daily -Change SSI from resistant to moderate. -Decrease NovoLog from 5 to 3 units 3 times daily with meals -Continue home statin.  Acute blood loss and anemia/rectal bleed: Reportedly had bloody bowel movement the evening of 2/6. Recent Labs    10/06/2022 1519 09/30/22 0425 10/01/22 0412 10/02/22 0532 10/03/22 0611 10/04/22 0424 10/06/22 0427  HGB 10.9*  10.0* 9.8* 8.5* 7.6* 7.8* 8.5*  -Discontinued Lovenox -SCD for VTE prophylaxis -Monitor H&H  Agitation: Off Precedex.  Improved. -Decreased Ativan to 1 every 6 hours as needed -Started Seroquel nightly -Reorientation delirium precautions.   Abdominal pain: Likely due to malignancy. -Adjusted pain medication -Continue bowel regimen.   Paroxysmal atrial tachycardia -currently stable,  -Continue home beta-blocker -Not able to be anticoagulated due to worsening anemia and possible GI bleed   OSA (obstructive sleep apnea) -maintain on BiPAP whenever sleeping   Possible cellulitis of left leg  -Status post antibiotics.  Goal of care: DNR/DNI -Palliative medicine following. -Plan for discharge home with home hospice -[facility] consulted for hospice referral.  Obesity: Body mass index is 32.02 kg/m.          DVT prophylaxis:  Place and maintain sequential compression device Start: 10/03/22 0830 SCDs Start: 10/17/2022 1659  Code Status: DNR/DNI Family Communication: None at bedside today. Level of care: Progressive Status is: Inpatient Remains inpatient appropriate because: Respiratory failure and hospice referral   Final disposition: Home with home hospice. Consultants:  Pulmonology-signed off Cardiology Palliative medicine  55 minutes with more than 50% spent in reviewing records, counseling patient/family and coordinating care.   Sch Meds:  Scheduled Meds:  acetaminophen  650 mg Oral Q6H WA   arformoterol  15 mcg Nebulization BID  atorvastatin  40 mg Oral Daily   budesonide (PULMICORT) nebulizer solution  0.5 mg Nebulization BID   Chlorhexidine Gluconate Cloth  6 each Topical Q0600   docusate sodium  100 mg Oral BID   gabapentin  300 mg Oral TID   guaiFENesin  600 mg Oral BID   insulin aspart  0-15 Units Subcutaneous TID WC   insulin aspart  0-5 Units Subcutaneous QHS   insulin aspart  3 Units Subcutaneous TID WC   insulin glargine-yfgn  10 Units Subcutaneous  BID   losartan  25 mg Oral Daily   metoprolol succinate  25 mg Oral Daily   mupirocin ointment  1 Application Nasal BID   mouth rinse  15 mL Mouth Rinse 4 times per day   QUEtiapine  50 mg Oral QHS   revefenacin  175 mcg Nebulization Daily   rOPINIRole  1 mg Oral QHS   senna-docusate  1 tablet Oral BID   torsemide  40 mg Oral Daily   Continuous Infusions:   PRN Meds:.guaiFENesin-dextromethorphan, HYDROmorphone (DILAUDID) injection, ipratropium-albuterol, LORazepam, nitroGLYCERIN, ondansetron **OR** ondansetron (ZOFRAN) IV, mouth rinse, oxyCODONE, polyethylene glycol  Antimicrobials: Anti-infectives (From admission, onward)    Start     Dose/Rate Route Frequency Ordered Stop   09/30/22 1530  cefTRIAXone (ROCEPHIN) 1 g in sodium chloride 0.9 % 100 mL IVPB        1 g 200 mL/hr over 30 Minutes Intravenous Every 24 hours 09/27/2022 1652 10/03/22 1619   09/30/22 0500  doxycycline (VIBRAMYCIN) 100 mg in sodium chloride 0.9 % 250 mL IVPB        100 mg 125 mL/hr over 120 Minutes Intravenous Every 12 hours 10/13/2022 1652 10/03/22 1940   10/10/2022 1545  doxycycline (VIBRAMYCIN) 100 mg in sodium chloride 0.9 % 250 mL IVPB        100 mg 125 mL/hr over 120 Minutes Intravenous  Once 10/11/2022 1527 09/28/2022 2004   10/19/2022 1530  cefTRIAXone (ROCEPHIN) 1 g in sodium chloride 0.9 % 100 mL IVPB        1 g 200 mL/hr over 30 Minutes Intravenous  Once 10/04/2022 1527 10/13/2022 1613        I have personally reviewed the following labs and images: CBC: Recent Labs  Lab 10/08/2022 1519 09/30/22 0425 10/01/22 0412 10/02/22 0532 10/03/22 0611 10/04/22 0424 October 10, 2022 0427  WBC 15.1*   < > 14.3* 15.1* 10.7* 13.4* 15.5*  NEUTROABS 8.3*  --   --   --   --   --   --   HGB 10.9*   < > 9.8* 8.5* 7.6* 7.8* 8.5*  HCT 37.4*   < > 32.3* 28.6* 25.7* 26.5* 29.9*  MCV 102.2*   < > 100.0 99.7 99.6 100.8* 104.2*  PLT 210   < > 215 204 161 169 203   < > = values in this interval not displayed.   BMP &GFR Recent  Labs  Lab 09/30/22 0425 09/30/22 1213 10/01/22 0412 10/02/22 0532 10/03/22 0611 10/04/22 0424 10-10-22 0427  NA 143   < > 141 137 138 140 140  K 5.2*   < > 4.6 4.6 4.7 4.6 4.4  CL 99   < > 96* 94* 96* 98 96*  CO2 37*   < > 34* 35* 36* 37* 37*  GLUCOSE 309*   < > 140* 135* 138* 198* 174*  BUN 30*   < > 40* 39* 43* 41* 45*  CREATININE 0.89   < > 0.81 0.75 0.74 0.76  0.88  CALCIUM 8.7*   < > 8.8* 8.0* 8.1* 8.1* 8.2*  MG 2.0  --   --   --   --  2.2 2.1  PHOS 3.6  --   --   --   --   --  4.5   < > = values in this interval not displayed.   Estimated Creatinine Clearance: 72.7 mL/min (by C-G formula based on SCr of 0.88 mg/dL). Liver & Pancreas: Recent Labs  Lab 10/21/2022 1519 10/04/22 0424 10-17-2022 0427  AST 17 10* 14*  ALT 21 15 14   ALKPHOS 105 71 79  BILITOT 0.7 0.5 0.7  PROT 7.2 5.2* 5.9*  ALBUMIN 3.8 2.9* 3.1*   Recent Labs  Lab 10/04/22 0424  LIPASE 27   No results for input(s): "AMMONIA" in the last 168 hours. Diabetic: No results for input(s): "HGBA1C" in the last 72 hours. Recent Labs  Lab 10/04/22 0723 10/04/22 1103 10/04/22 1613 10-17-22 0723 10-17-2022 1135  GLUCAP 199* 201* 305* 134* 119*   Cardiac Enzymes: No results for input(s): "CKTOTAL", "CKMB", "CKMBINDEX", "TROPONINI" in the last 168 hours. No results for input(s): "PROBNP" in the last 8760 hours. Coagulation Profile: No results for input(s): "INR", "PROTIME" in the last 168 hours. Thyroid Function Tests: No results for input(s): "TSH", "T4TOTAL", "FREET4", "T3FREE", "THYROIDAB" in the last 72 hours. Lipid Profile: No results for input(s): "CHOL", "HDL", "LDLCALC", "TRIG", "CHOLHDL", "LDLDIRECT" in the last 72 hours. Anemia Panel: Recent Labs    10/04/22 0424 10/04/22 0425  VITAMINB12  --  308  FOLATE 21.0  --   FERRITIN 59  --   TIBC 241*  --   IRON 47  --   RETICCTPCT  --  3.4*   Urine analysis:    Component Value Date/Time   COLORURINE YELLOW 02/11/2012 [place] 02/11/2012 1608   LABSPEC 1.010 02/11/2012 1608   PHURINE 6.5 02/11/2012 1608   GLUCOSEU NEGATIVE 02/11/2012 1608   HGBUR 2+ 02/11/2012 1608   BILIRUBINUR NEGATIVE 02/11/2012 1608   KETONESUR NEGATIVE 02/11/2012 1608   PROTEINUR NEGATIVE 02/11/2012 1608   NITRITE NEGATIVE 02/11/2012 1608   LEUKOCYTESUR NEGATIVE 02/11/2012 1608   Sepsis Labs: Invalid input(s): "PROCALCITONIN", "LACTICIDVEN"  Microbiology: Recent Results (from the past 240 hour(s))  Resp panel by RT-PCR (RSV, Flu A&B, Covid)     Status: None   Collection Time: 10/02/2022  3:42 PM  Result Value Ref Range Status   SARS Coronavirus 2 by RT PCR NEGATIVE NEGATIVE Final    Comment: (NOTE) SARS-CoV-2 target nucleic acids are NOT DETECTED.  The SARS-CoV-2 RNA is generally detectable in upper respiratory specimens during the acute phase of infection. The lowest concentration of SARS-CoV-2 viral copies this assay can detect is 138 copies/mL. A negative result does not preclude SARS-Cov-2 infection and should not be used as the sole basis for treatment or other patient management decisions. A negative result may occur with  improper specimen collection/handling, submission of specimen other than nasopharyngeal swab, presence of viral mutation(s) within the areas targeted by this assay, and inadequate number of viral copies(<138 copies/mL). A negative result must be combined with clinical observations, patient history, and epidemiological information. The expected result is Negative.  Fact Sheet for Patients:  EntrepreneurPulse.com.au  Fact Sheet for Healthcare Providers:  IncredibleEmployment.be  This test is no t yet approved or cleared by the [place] FDA and  has been authorized for detection and/or diagnosis of SARS-CoV-2 by FDA under an Emergency Use Authorization (EUA). This  EUA will remain  in effect (meaning this test can be used) for the duration of the COVID-19  declaration under Section 564(b)(1) of the Act, 21 U.S.C.section 360bbb-3(b)(1), unless the authorization is terminated  or revoked sooner.       Influenza A by PCR NEGATIVE NEGATIVE Final   Influenza B by PCR NEGATIVE NEGATIVE Final    Comment: (NOTE) The Xpert Xpress SARS-CoV-2/FLU/RSV plus assay is intended as an aid in the diagnosis of influenza from Nasopharyngeal swab specimens and should not be used as a sole basis for treatment. Nasal washings and aspirates are unacceptable for Xpert Xpress SARS-CoV-2/FLU/RSV testing.  Fact Sheet for Patients: EntrepreneurPulse.com.au  Fact Sheet for Healthcare Providers: IncredibleEmployment.be  This test is not yet approved or cleared by the [place] FDA and has been authorized for detection and/or diagnosis of SARS-CoV-2 by FDA under an Emergency Use Authorization (EUA). This EUA will remain in effect (meaning this test can be used) for the duration of the COVID-19 declaration under Section 564(b)(1) of the Act, 21 U.S.C. section 360bbb-3(b)(1), unless the authorization is terminated or revoked.     Resp Syncytial Virus by PCR NEGATIVE NEGATIVE Final    Comment: (NOTE) Fact Sheet for Patients: EntrepreneurPulse.com.au  Fact Sheet for Healthcare Providers: IncredibleEmployment.be  This test is not yet approved or cleared by the [place] FDA and has been authorized for detection and/or diagnosis of SARS-CoV-2 by FDA under an Emergency Use Authorization (EUA). This EUA will remain in effect (meaning this test can be used) for the duration of the COVID-19 declaration under Section 564(b)(1) of the Act, 21 U.S.C. section 360bbb-3(b)(1), unless the authorization is terminated or revoked.  Performed at [facility], [address] [place], [place] [address]   MRSA Next Gen by PCR, Nasal     Status: Abnormal   Collection Time: 10/21/2022  5:48  PM   Specimen: Nasal Mucosa; Nasal Swab  Result Value Ref Range Status   MRSA by PCR Next Gen DETECTED (A) NOT DETECTED Final    Comment: CRITICAL RESULT CALLED TO, READ BACK BY AND VERIFIED WITH: Laurin Coder RN @ 2221 10/19/2022 LFD (NOTE) The GeneXpert MRSA Assay (FDA approved for NASAL specimens only), is one component of a comprehensive MRSA colonization surveillance program. It is not intended to diagnose MRSA infection nor to guide or monitor treatment for MRSA infections. Test performance is not FDA approved in patients less than 13 years old. Performed at [facility], [address] [place], [place] [address]     Radiology Studies: No results found.    Aylana Hirschfeld T. Dolores  If 7PM-7AM, please contact night-coverage [email or web address] 10-18-2022, 2:11 PM

## 2022-10-26 NOTE — TOC Progression Note (Signed)
Transition of Care [facility]) - Progression Note    Patient Details  Name: Julian Sparks MRN: [id number] Date of Birth: [birth year]  [facility] CM/SW Contact  Shelbie Hutching, RN Phone Number: Oct 22, 2022, 12:22 PM  Clinical Narrative:    Met with patient and daughter and granddaughter at the bedside to offer choice of hospice agency.  Daughter agrees to meet with [facility], will reach out to liaison, [place].   Plan is for hospice at home, patient will need a hospital bed, bedside table.  Daughter is a little overwhelmed and worried about taking the patient home.     Expected Discharge Plan: Home w Hospice Care Barriers to Discharge: Continued Medical Work up  Expected Discharge Plan and Services In-house Referral: Hospice / [place] Discharge Planning Services: CM Consult   Living arrangements for the past 2 months: Single Family Home                                       Social Determinants of Health (SDOH) Interventions SDOH Screenings   Food Insecurity: No Food Insecurity (06/20/2022)  Housing: Low Risk  (06/20/2022)  Transportation Needs: No Transportation Needs (06/20/2022)  Utilities: Not At Risk (06/20/2022)  Alcohol Screen: Low Risk  (06/20/2022)  Depression (PHQ2-9): Low Risk  (06/20/2022)  Financial Resource Strain: Low Risk  (06/20/2022)  Physical Activity: Sufficiently Active (06/20/2022)  Social Connections: Socially Isolated (06/20/2022)  Stress: No Stress Concern Present (06/20/2022)  Tobacco Use: Medium Risk (10/01/2022)    Readmission Risk Interventions    10/03/2022   12:02 PM 04/14/2021    2:42 PM  Readmission Risk Prevention Plan  Transportation Screening Complete Complete  PCP or Specialist Appt within 3-5 Days Complete   HRI or [place] Complete   Social Work Consult for [place] Planning/Counseling Complete   Palliative Care Screening Complete   Medication Review Press photographer) Complete Complete  PCP  or Specialist appointment within 3-5 days of discharge  Complete  HRI or [place]  Complete  SW Recovery Care/Counseling Consult  Complete  Palliative Care Screening  Not Applicable  Skilled Nursing Facility  Complete

## 2022-10-26 NOTE — Progress Notes (Signed)
Rounding Note    Patient Name: Julian Sparks Date of Encounter: 10-08-22  [facility] Cardiologist: Ida Rogue, MD   Subjective   No acute events overnight Up in the chair this morning Continued coughing, not much sputum Several runs short nonsustained VT on telemetry  Inpatient Medications    Scheduled Meds:  acetaminophen  650 mg Oral Q6H WA   arformoterol  15 mcg Nebulization BID   atorvastatin  40 mg Oral Daily   budesonide (PULMICORT) nebulizer solution  0.5 mg Nebulization BID   Chlorhexidine Gluconate Cloth  6 each Topical Q0600   docusate sodium  100 mg Oral BID   gabapentin  300 mg Oral TID   guaiFENesin  600 mg Oral BID   insulin aspart  0-15 Units Subcutaneous TID WC   insulin aspart  0-5 Units Subcutaneous QHS   insulin aspart  3 Units Subcutaneous TID WC   insulin glargine-yfgn  10 Units Subcutaneous BID   losartan  25 mg Oral Daily   metoprolol succinate  25 mg Oral Daily   mupirocin ointment  1 Application Nasal BID   mouth rinse  15 mL Mouth Rinse 4 times per day   QUEtiapine  50 mg Oral QHS   revefenacin  175 mcg Nebulization Daily   rOPINIRole  1 mg Oral QHS   senna-docusate  1 tablet Oral BID   torsemide  40 mg Oral Daily   Continuous Infusions:  PRN Meds: guaiFENesin-dextromethorphan, HYDROmorphone (DILAUDID) injection, ipratropium-albuterol, LORazepam, nitroGLYCERIN, ondansetron **OR** ondansetron (ZOFRAN) IV, mouth rinse, oxyCODONE, polyethylene glycol   Vital Signs    Vitals:   10/08/22 1000 10/08/2022 1100 2022/10/08 1200 2022-10-08 1300  BP: 110/79 (!) 98/55 (!) 102/49 (!) 99/50  Pulse: 95 94  94  Resp: 18  20 (!) 27  Temp:   97.8 F (36.6 C)   TempSrc:   Oral   SpO2: 100% 100% 92% 91%  Weight:      Height:        Intake/Output Summary (Last 24 hours) at 10-08-2022 1415 Last data filed at 2022-10-08 1005 Gross per 24 hour  Intake 240 ml  Output 900 ml  Net -660 ml      10/03/2022    4:08 AM 10/02/2022    3:54 AM  10/01/2022    4:08 AM  Last 3 Weights  Weight (lbs) 198 lb 6.6 oz 200 lb 2.8 oz 215 lb 9.8 oz  Weight (kg) 90 kg 90.8 kg 97.8 kg      Telemetry    Normal sinus rhythm, several episodes short nonsustained VT- Personally Reviewed  ECG     - Personally Reviewed  Physical Exam   GEN: No acute distress.   Neck: No JVD Cardiac: RRR, no murmurs, rubs, or gallops.  Trivial lower extremity edema Respiratory: Coarse breath sounds, inspiratory and expiratory wheezing, Rales GI: Soft, nontender, non-distended  MS: No edema; No deformity. Neuro:  Nonfocal  Psych: Normal affect   Labs    High Sensitivity Troponin:   Recent Labs  Lab 09/28/2022 1519 10/24/2022 1816  TROPONINIHS 23* 21*     Chemistry Recent Labs  Lab 10/22/2022 1519 09/30/22 0425 09/30/22 1213 10/03/22 0611 10/04/22 0424 10/08/22 0427  NA 140 143   < > 138 140 140  K 4.8 5.2*   < > 4.7 4.6 4.4  CL 98 99   < > 96* 98 96*  CO2 36* 37*   < > 36* 37* 37*  GLUCOSE 328* 309*   < >  138* 198* 174*  BUN 24* 30*   < > 43* 41* 45*  CREATININE 0.88 0.89   < > 0.74 0.76 0.88  CALCIUM 8.7* 8.7*   < > 8.1* 8.1* 8.2*  MG  --  2.0  --   --  2.2 2.1  PROT 7.2  --   --   --  5.2* 5.9*  ALBUMIN 3.8  --   --   --  2.9* 3.1*  AST 17  --   --   --  10* 14*  ALT 21  --   --   --  15 14  ALKPHOS 105  --   --   --  71 79  BILITOT 0.7  --   --   --  0.5 0.7  GFRNONAA >60 >60   < > >60 >60 >60  ANIONGAP 6 7   < > 6 5 7    < > = values in this interval not displayed.    Lipids No results for input(s): "CHOL", "TRIG", "HDL", "LABVLDL", "LDLCALC", "CHOLHDL" in the last 168 hours.  Hematology Recent Labs  Lab 10/03/22 0611 10/04/22 0424 10/04/22 0425 Oct 16, 2022 0427  WBC 10.7* 13.4*  --  15.5*  RBC 2.58* 2.63* 2.60* 2.87*  HGB 7.6* 7.8*  --  8.5*  HCT 25.7* 26.5*  --  29.9*  MCV 99.6 100.8*  --  104.2*  MCH 29.5 29.7  --  29.6  MCHC 29.6* 29.4*  --  28.4*  RDW 14.5 14.8  --  14.9  PLT 161 169  --  203   Thyroid No results for  input(s): "TSH", "FREET4" in the last 168 hours.  BNP Recent Labs  Lab 10/19/2022 1519 09/30/22 0425 10/04/22 0425  BNP 260.4* 325.4* 136.3*    DDimer  Recent Labs  Lab 10/07/2022 1945  DDIMER 0.93*     Radiology    No results found.  Cardiac Studies   Echo Left ventricular ejection fraction, by estimation, is 50 to 55%. The  left ventricle has low normal function. The left ventricle has no regional  wall motion abnormalities. There is mild left ventricular hypertrophy.  Left ventricular diastolic function   could not be evaluated. There is the interventricular septum is flattened  in systole, consistent with right ventricular pressure overload.   2. Right ventricular systolic function is moderately reduced. The right  ventricular size is severely enlarged. There is moderately elevated  pulmonary artery systolic pressure. The estimated right ventricular  systolic pressure is 43.3 mmHg.   3. The mitral valve is grossly normal. Trivial mitral valve  regurgitation. No evidence of mitral stenosis.   4. The aortic valve is tricuspid. There is mild calcification of the  aortic valve. There is mild thickening of the aortic valve. Aortic valve  regurgitation is not visualized. Aortic valve sclerosis is present, with  no evidence of aortic valve stenosis.   Patient Profile     77 y.o. male with a past medical history of diastolic congestive heart failure, hypertension, COPD on chronic O2 at home of 2-4L, Type II diabetes, insomnia, OSA, restless leg syndrome, who is being seen and evaluated for diastolic congestive heart failure.      Assessment & Plan    Acute on chronic hypoxic respiratory failure Multi factorial in the setting of COPD exacerbation, pneumonia, acute on chronic diastolic CHF, malignancy, anemia He has been treated with steroids, BiPAP, inhalers, Lasix IV -Remains wheezy on exam -Will continue torsemide 40 daily -Expect a slow recovery  in the setting of severe  COPD, anemia, malignancy -Agree with palliative care consultation   Acute on chronic diastolic CHF Normal ejection fraction 50 to 55% -7 L Tolerating torsemide 40 daily, slow climbing BUN On metoprolol succinate 25 daily, losartan (will decrease dose down to 12.5 daily given hypotension)   Elevated troponin Supply/demand mismatch in the setting of respiratory failure Not a good candidate for ischemic workup at this time   Possible bronchogenic carcinoma, lymphadenopathy Detailed on CT scan Oncology consulted   Diabetes type 2 poorly controlled A1c 10.1 Management per hospitalist service, on insulin   Total encounter time more than 50 minutes  Greater than 50% was spent in counseling and coordination of care with the patient   For questions or updates, please contact [place] Please consult [email or web address] for contact info under        Signed, Ida Rogue, MD  October 15, 2022, 2:15 PM

## 2022-10-26 NOTE — Progress Notes (Signed)
[facility]) Hospital Liaison Note   Received request from Transitions of Care Manager, Pryor Montes, to meet with family at bedside to explain hospice services (home vs Hospice Home). MSW met patient and family at bedside and provided extensive education for hospice services (PMT provider/Taylor H., NP present).  Family requested additional time to discuss options with patient and to reach out to patients' siblings before making a final decision. MSW voiced understanding. All questions answered and no concerns voiced.  At this time, patient is not under review for [facility] services as family continues to have ongoing [place] discussions.   [facility] information and contact numbers given to family & above information shared with TOC.   Please call with any questions/concerns.    Thank you for the opportunity to participate in this patient's care.   Phillis Haggis, MSW [facility] Liaison  [phone]

## 2022-10-26 NOTE — Progress Notes (Signed)
Report called to Velna Hatchet, RN on 1C. Patient will be moved to room 110.

## 2022-10-26 NOTE — Progress Notes (Signed)
   Nov 01, 2022 0200  Vitals  BP 95/64  MAP (mmHg) 73  BP Location Left Arm  BP Method Automatic  Patient Position (if appropriate) Lying  Pulse Rate 80  Pulse Rate Source Monitor  ECG Heart Rate 79  Resp 20  Oxygen Therapy  SpO2 98 %  O2 Device Nasal Cannula  O2 Flow Rate (L/min) 6 L/min   Multiple ectopies (unifocal, multifocal, couplets) and occasional runs- of non sustained v-tach and AIVR not longer than 12 beats was reported by tele and reviewed primary RN. Patient remains asymptomatic, VSS.  Midlevel provider is aware.

## 2022-10-26 DEATH — deceased

## 2022-11-09 ENCOUNTER — Ambulatory Visit: Payer: Medicare HMO | Admitting: Family

## 2022-12-03 IMAGING — DX DG CHEST 1V PORT
1 series · 1 of 1 positions shown · non-contrast
Comparison: 04/13/2021

CLINICAL DATA: Cough, respiratory failure

EXAM:
PORTABLE CHEST 1 VIEW

[chest ap]
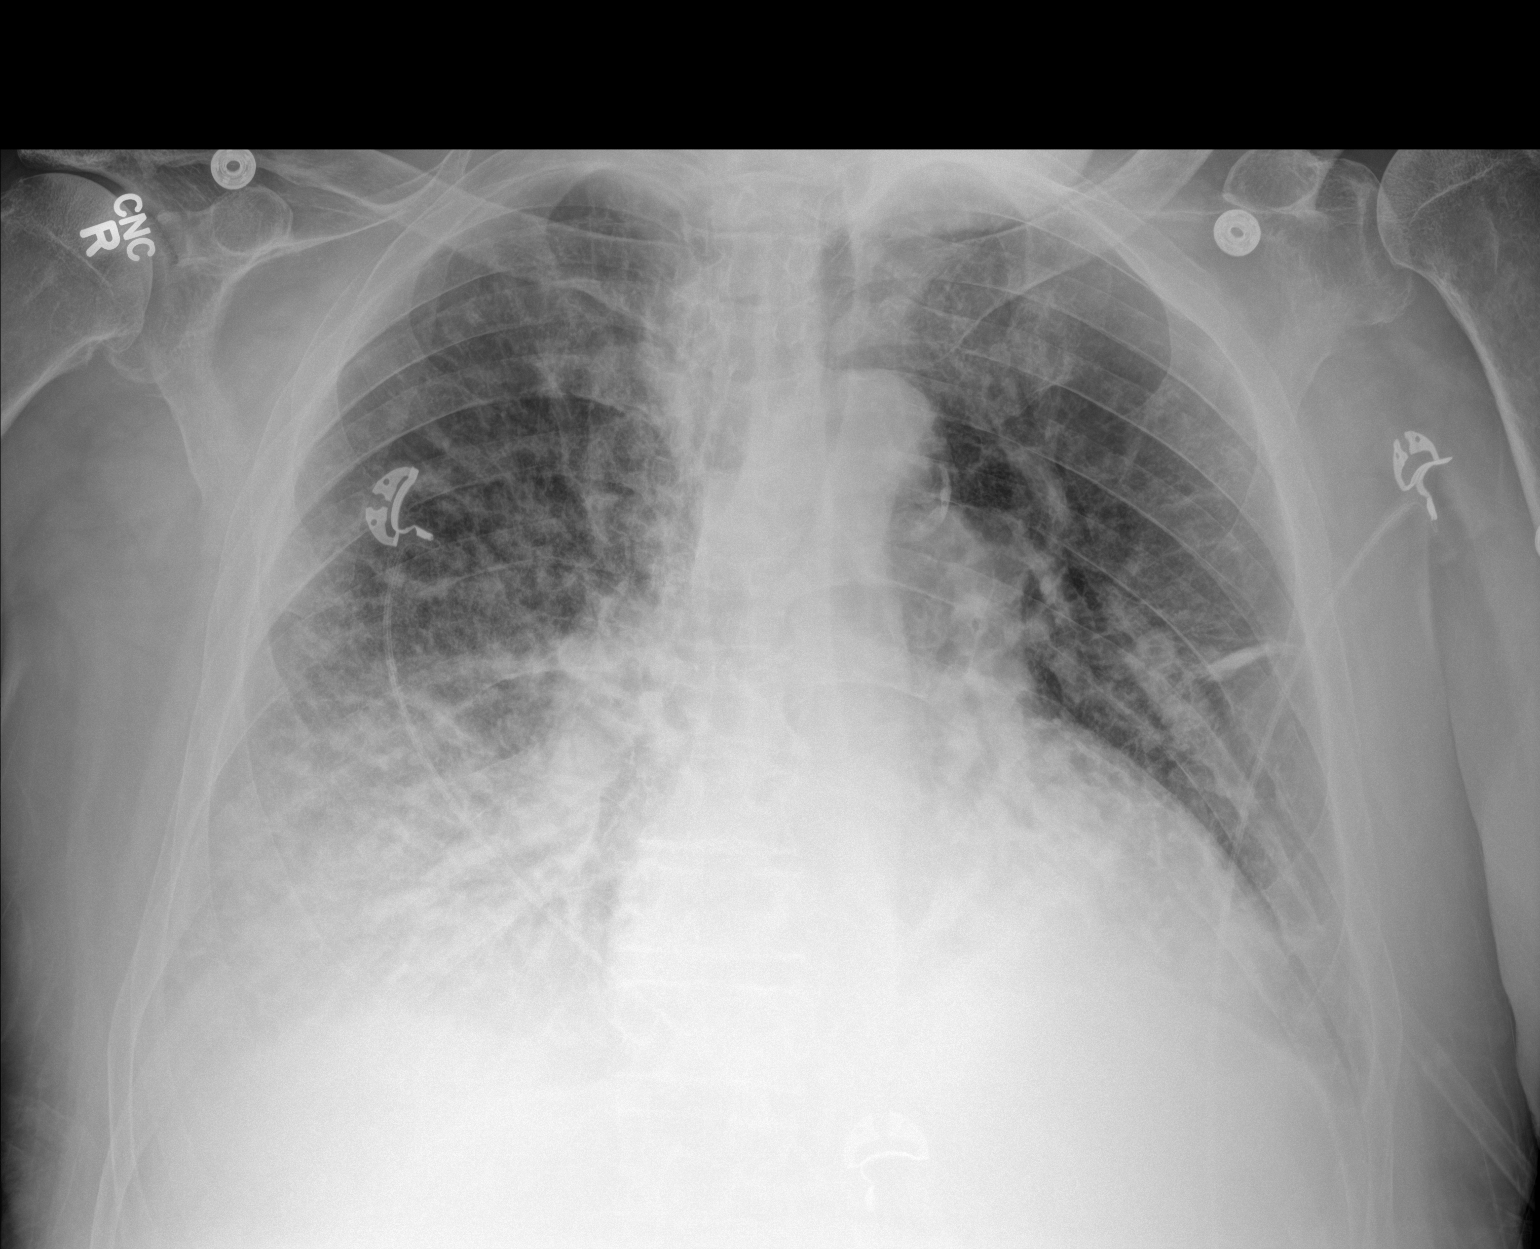

[1 of 1 positions shown; findings below may reference images not displayed]

FINDINGS: Heart is mildly enlarged. Worsening bilateral airspace disease,
right greater than left. Suspect small effusions, also likely
greater on the right. No acute bony abnormality.
IMPRESSION: Worsening bilateral airspace disease and effusions, right greater
than left. Findings could reflect asymmetric edema or infection.

## 2022-12-13 ENCOUNTER — Encounter: Payer: Self-pay | Admitting: Pulmonary Disease

## 2023-01-17 ENCOUNTER — Telehealth: Payer: Self-pay | Admitting: Pulmonary Disease

## 2023-01-17 NOTE — Telephone Encounter (Signed)
Paperwork was sent to fill up for an adverse event. I have reached out to [facility] for clarification of responsibility for filling out. Provider stated pt was doing well on medication

## 2023-01-22 NOTE — Telephone Encounter (Signed)
Julian Sparks stated not to worry about paperwork not needed

## 2023-01-22 NOTE — Telephone Encounter (Signed)
Noted  

## 2023-04-25 ENCOUNTER — Telehealth: Payer: Self-pay | Admitting: Pulmonary Disease

## 2023-04-26 NOTE — Telephone Encounter (Signed)
When I called the number listed it went to [employer].

## 2023-04-30 NOTE — Telephone Encounter (Signed)
I tried to contact Dr. Thedore Mins. I did not get an answer and the voicemail was not secure. I will try again later.

## 2023-04-30 NOTE — Telephone Encounter (Signed)
Mr. Deamer death had nothing to do with Dupixent.

## 2023-05-01 NOTE — Telephone Encounter (Signed)
Dr. Thedore Mins with [employer] is aware of Dr. Georgann Housekeeper response and voiced his understanding. Dr. Thedore Mins had additional questions. He is aware that patient was in fact giving starter dose of Dupixent (based off of 12/02/2022 visit). He asked what cause of death was and if patient had additional dx. According to 10/15/2022 admission, patient was dx with CHF and COPD exacerbation. Nothing further needed.

## 2023-05-13 ENCOUNTER — Telehealth: Payer: Self-pay | Admitting: Pulmonary Disease

## 2023-05-13 NOTE — Telephone Encounter (Signed)
Spoke to Dr. Lucile Shutters with [employer]. He has additional questions for Dr. Jayme Cloud.     He is requesting a call back at [phone] ext [phone]

## 2023-05-23 NOTE — Telephone Encounter (Signed)
Dr. Jayme Cloud, please advise. Thanks
